# Patient Record
Sex: Male | Born: 1975 | Race: Black or African American | Hispanic: No
Health system: Southern US, Community
[De-identification: ages and names within clinical notes are randomized; demographics above are authoritative.]

## PROBLEM LIST (undated history)

## (undated) DIAGNOSIS — F329 Major depressive disorder, single episode, unspecified: Secondary | ICD-10-CM

## (undated) DIAGNOSIS — F32A Depression, unspecified: Secondary | ICD-10-CM

## (undated) DIAGNOSIS — R569 Unspecified convulsions: Secondary | ICD-10-CM

## (undated) DIAGNOSIS — F319 Bipolar disorder, unspecified: Secondary | ICD-10-CM

## (undated) DIAGNOSIS — F209 Schizophrenia, unspecified: Secondary | ICD-10-CM

## (undated) HISTORY — PX: FINGER SURGERY: SHX640

## (undated) HISTORY — DX: Major depressive disorder, single episode, unspecified: F32.9

## (undated) HISTORY — DX: Bipolar disorder, unspecified: F31.9

## (undated) HISTORY — DX: Depression, unspecified: F32.A

## (undated) HISTORY — DX: Schizophrenia, unspecified: F20.9

---

## 1998-08-27 ENCOUNTER — Emergency Department (HOSPITAL_COMMUNITY): Admission: EM | Admit: 1998-08-27 | Discharge: 1998-08-27 | Payer: Self-pay | Admitting: Emergency Medicine

## 1998-08-27 ENCOUNTER — Encounter: Payer: Self-pay | Admitting: Emergency Medicine

## 1998-11-07 ENCOUNTER — Emergency Department (HOSPITAL_COMMUNITY): Admission: EM | Admit: 1998-11-07 | Discharge: 1998-11-07 | Payer: Self-pay | Admitting: Emergency Medicine

## 1998-12-30 ENCOUNTER — Emergency Department (HOSPITAL_COMMUNITY): Admission: EM | Admit: 1998-12-30 | Discharge: 1998-12-30 | Payer: Self-pay | Admitting: *Deleted

## 1999-03-22 ENCOUNTER — Inpatient Hospital Stay (HOSPITAL_COMMUNITY): Admission: AD | Admit: 1999-03-22 | Discharge: 1999-03-23 | Payer: Self-pay | Admitting: *Deleted

## 2000-06-24 ENCOUNTER — Encounter: Admission: RE | Admit: 2000-06-24 | Discharge: 2000-06-24 | Payer: Self-pay | Admitting: Family Medicine

## 2000-12-01 ENCOUNTER — Encounter: Admission: RE | Admit: 2000-12-01 | Discharge: 2000-12-01 | Payer: Self-pay | Admitting: Family Medicine

## 2001-01-21 ENCOUNTER — Encounter: Admission: RE | Admit: 2001-01-21 | Discharge: 2001-01-21 | Payer: Self-pay | Admitting: Family Medicine

## 2001-01-29 ENCOUNTER — Encounter: Admission: RE | Admit: 2001-01-29 | Discharge: 2001-02-19 | Payer: Self-pay | Admitting: Sports Medicine

## 2001-05-19 ENCOUNTER — Encounter: Admission: RE | Admit: 2001-05-19 | Discharge: 2001-05-19 | Payer: Self-pay | Admitting: Family Medicine

## 2001-11-16 ENCOUNTER — Encounter: Admission: RE | Admit: 2001-11-16 | Discharge: 2001-11-16 | Payer: Self-pay | Admitting: Family Medicine

## 2002-03-18 ENCOUNTER — Encounter: Admission: RE | Admit: 2002-03-18 | Discharge: 2002-03-18 | Payer: Self-pay | Admitting: Family Medicine

## 2002-03-18 ENCOUNTER — Ambulatory Visit (HOSPITAL_COMMUNITY): Admission: RE | Admit: 2002-03-18 | Discharge: 2002-03-18 | Payer: Self-pay | Admitting: Family Medicine

## 2002-03-22 ENCOUNTER — Encounter: Admission: RE | Admit: 2002-03-22 | Discharge: 2002-03-22 | Payer: Self-pay | Admitting: Sports Medicine

## 2002-03-22 ENCOUNTER — Encounter: Payer: Self-pay | Admitting: Sports Medicine

## 2003-06-22 ENCOUNTER — Emergency Department (HOSPITAL_COMMUNITY): Admission: EM | Admit: 2003-06-22 | Discharge: 2003-06-22 | Payer: Self-pay

## 2004-05-21 ENCOUNTER — Emergency Department (HOSPITAL_COMMUNITY): Admission: EM | Admit: 2004-05-21 | Discharge: 2004-05-21 | Payer: Self-pay | Admitting: Family Medicine
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
## 2004-05-25 ENCOUNTER — Ambulatory Visit: Payer: Self-pay | Admitting: Family Medicine

## 2004-09-21 ENCOUNTER — Emergency Department (HOSPITAL_COMMUNITY): Admission: EM | Admit: 2004-09-21 | Discharge: 2004-09-21 | Payer: Self-pay | Admitting: Emergency Medicine
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
## 2004-09-28 ENCOUNTER — Ambulatory Visit: Payer: Self-pay | Admitting: Family Medicine

## 2004-10-22 ENCOUNTER — Ambulatory Visit: Payer: Self-pay | Admitting: Family Medicine

## 2005-09-06 IMAGING — CR DG FOOT COMPLETE 3+V*R*
3 series · 3 of 3 positions shown · non-contrast
Comparison: none

CLINICAL DATA: pain
RIGHT FOOT - 3 VIEW:

[view not recorded (1 of 3)]
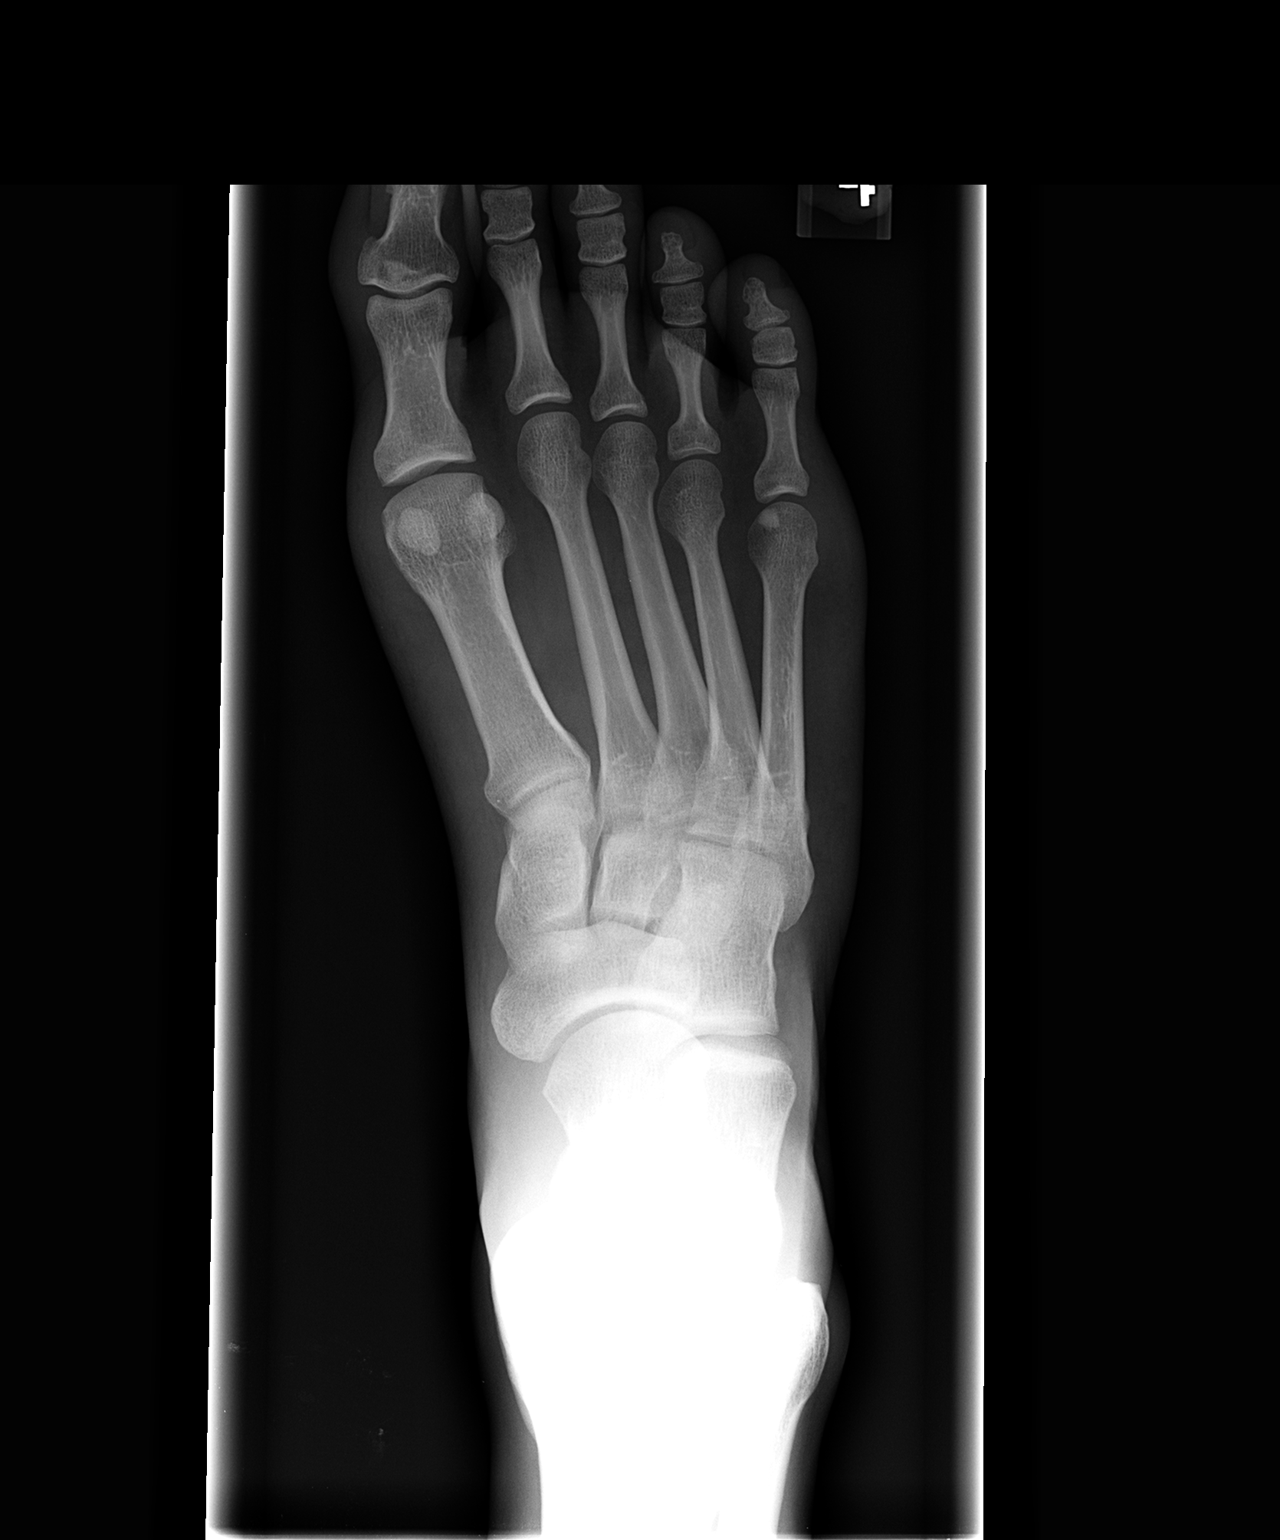

[view not recorded (2 of 3)]
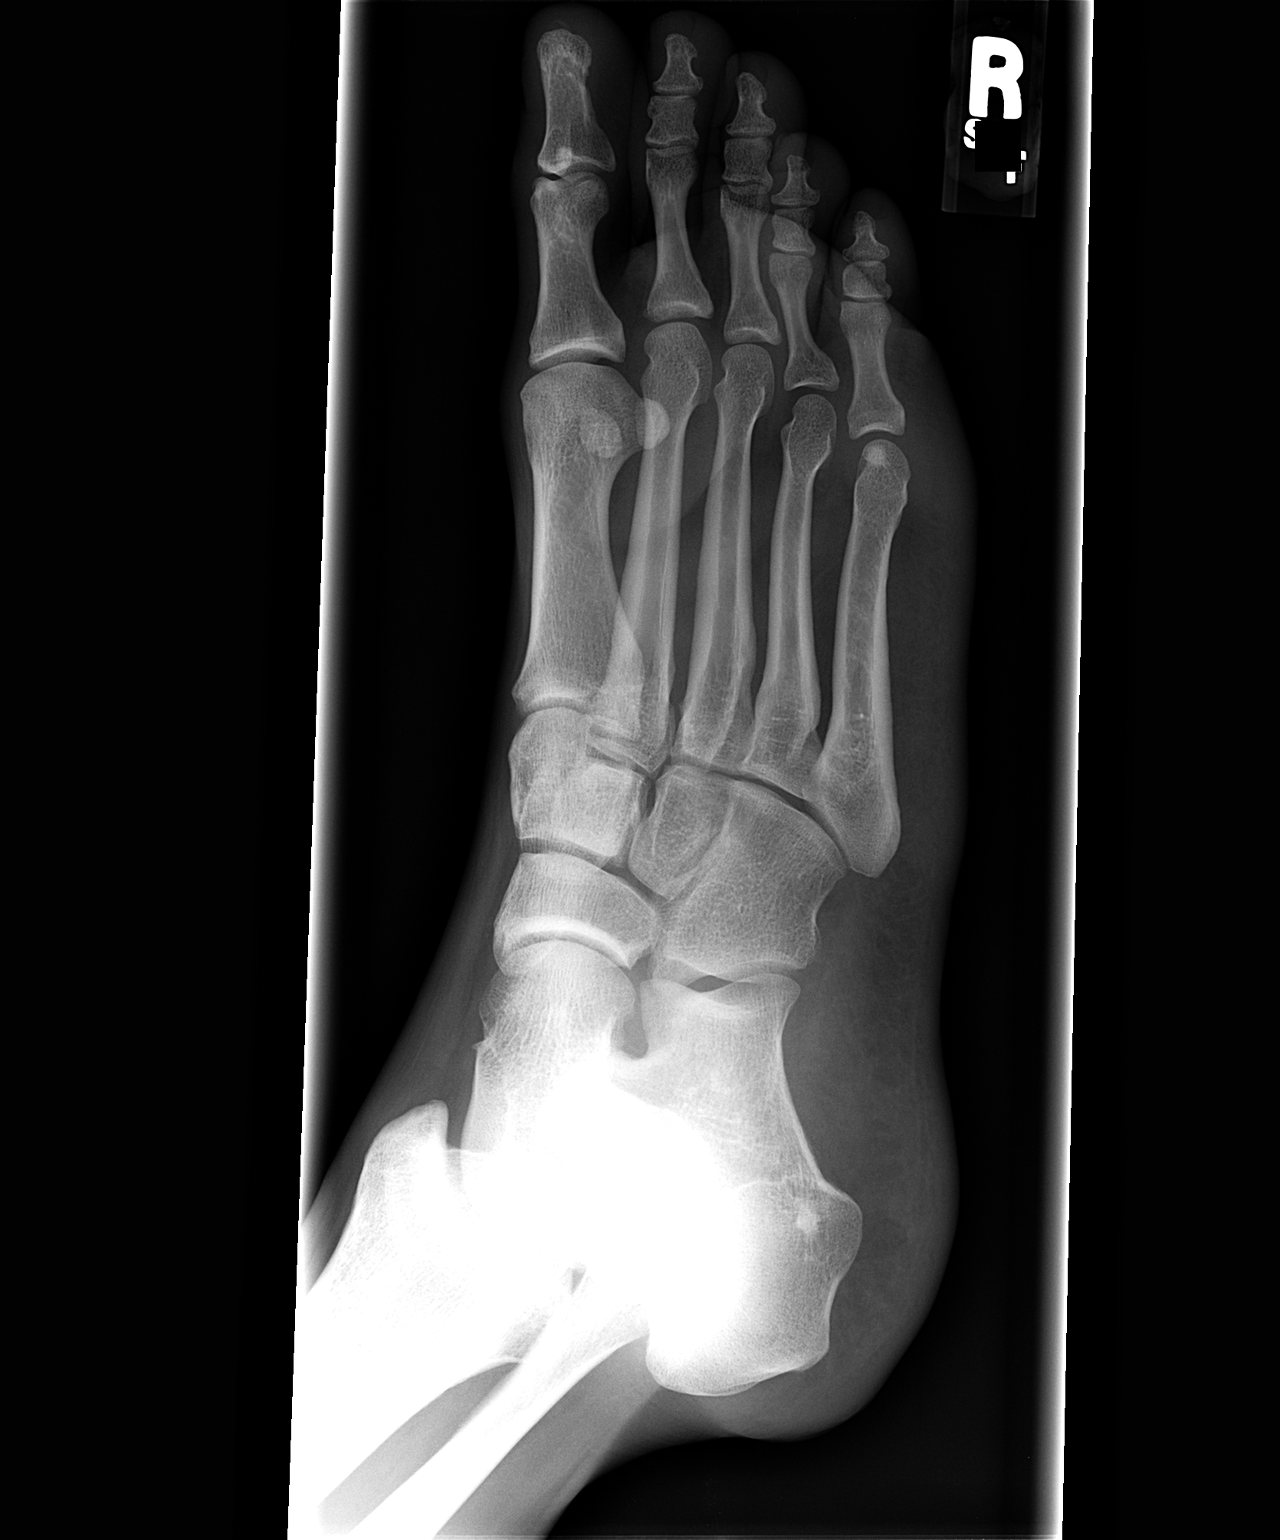

[view not recorded (3 of 3)]
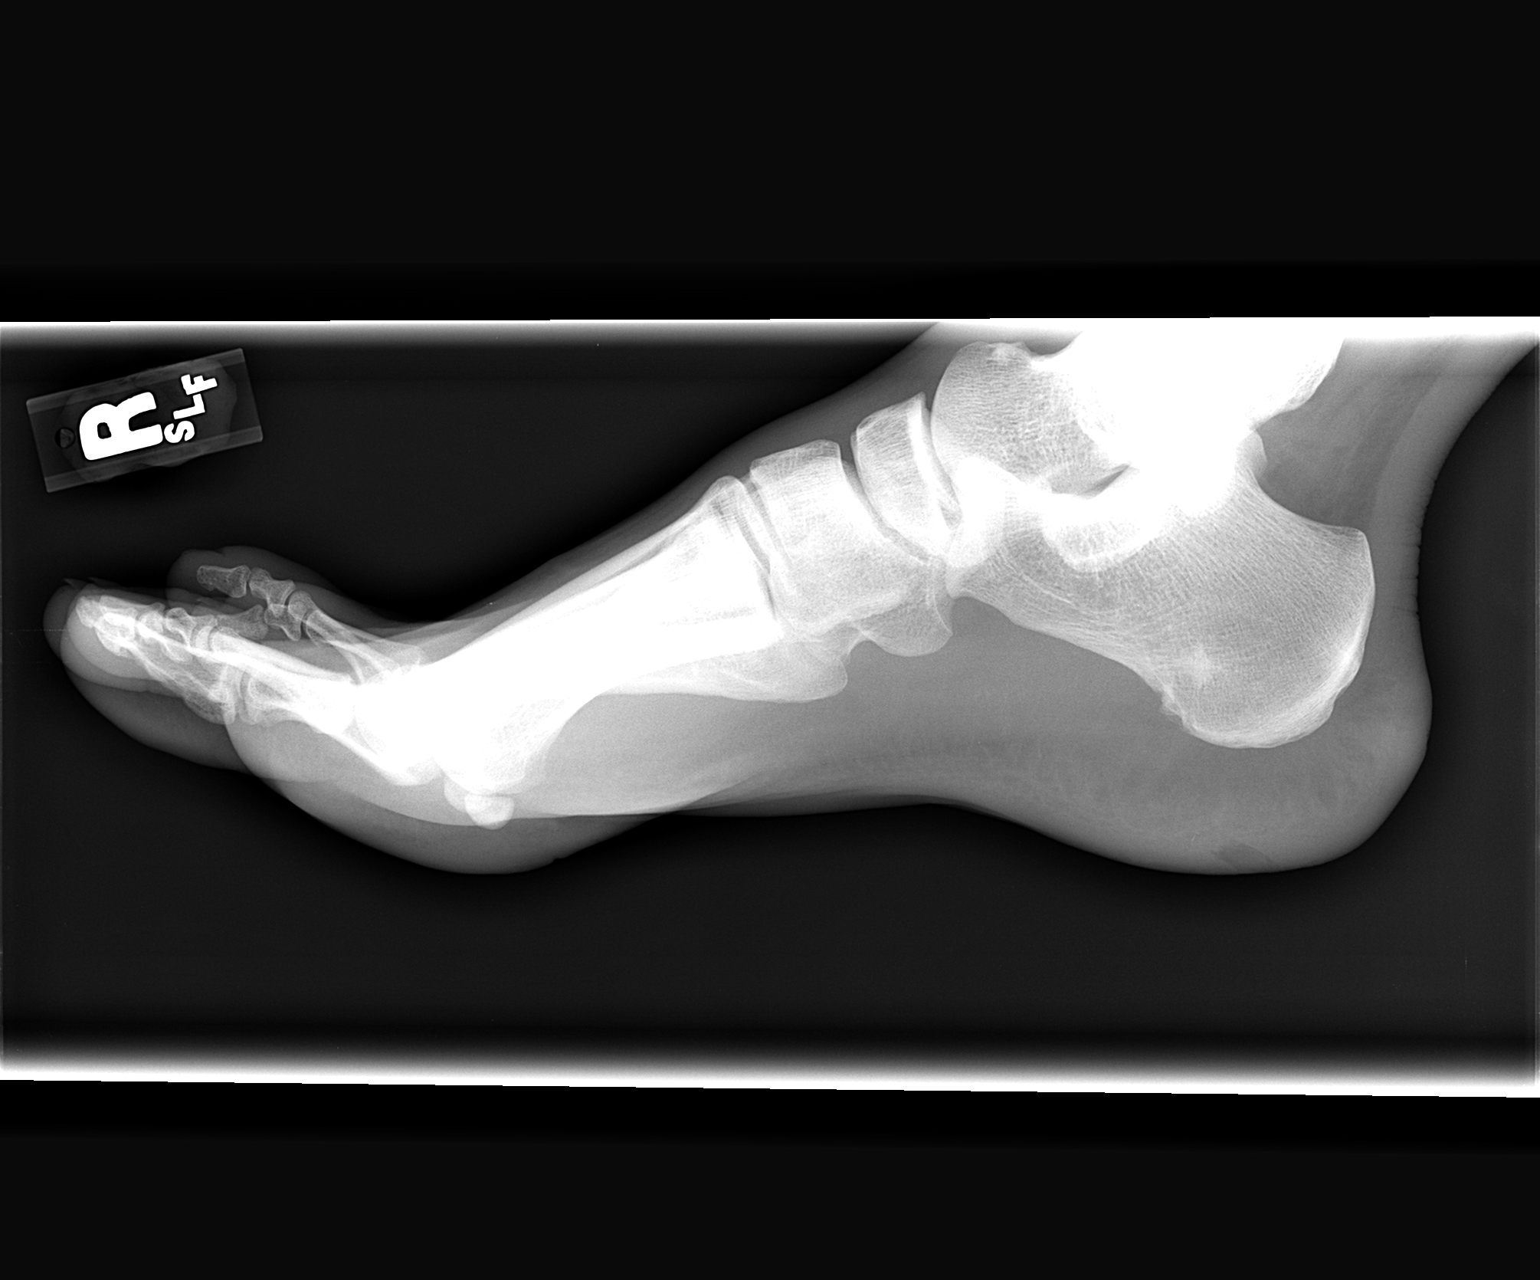

[3 of 3 positions shown; findings below may reference images not displayed]

FINDINGS: There is no evidence of fracture or dislocation.  There is no evidence of arthropathy or other focal bone abnormality.  Soft tissues are unremarkable.
IMPRESSION: Negative.

## 2006-01-28 ENCOUNTER — Emergency Department (HOSPITAL_COMMUNITY): Admission: EM | Admit: 2006-01-28 | Discharge: 2006-01-28 | Payer: Self-pay | Admitting: Emergency Medicine

## 2008-08-16 ENCOUNTER — Emergency Department: Payer: Self-pay | Admitting: Emergency Medicine
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
## 2010-11-21 ENCOUNTER — Emergency Department: Payer: Self-pay | Admitting: Emergency Medicine
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
## 2011-07-12 ENCOUNTER — Emergency Department: Payer: Self-pay | Admitting: Emergency Medicine

## 2011-07-12 DIAGNOSIS — M7989 Other specified soft tissue disorders: Secondary | ICD-10-CM | POA: Diagnosis not present

## 2011-07-12 DIAGNOSIS — S93409A Sprain of unspecified ligament of unspecified ankle, initial encounter: Secondary | ICD-10-CM | POA: Diagnosis not present

## 2011-07-12 DIAGNOSIS — S99919A Unspecified injury of unspecified ankle, initial encounter: Secondary | ICD-10-CM | POA: Diagnosis not present

## 2011-07-12 DIAGNOSIS — Z79899 Other long term (current) drug therapy: Secondary | ICD-10-CM | POA: Diagnosis not present

## 2011-07-12 DIAGNOSIS — F17200 Nicotine dependence, unspecified, uncomplicated: Secondary | ICD-10-CM | POA: Diagnosis not present

## 2011-07-12 DIAGNOSIS — S8990XA Unspecified injury of unspecified lower leg, initial encounter: Secondary | ICD-10-CM | POA: Diagnosis not present

## 2011-07-12 IMAGING — CR DG ANKLE COMPLETE 3+V*L*
1 series · 5 of 5 positions shown · non-contrast
Comparison: none

REASON FOR EXAM: injury and pain
COMMENTS:

[Series 1: x ankle ap left · 0.14mm/px · 5 of 5 slices shown]
[im 1/5]
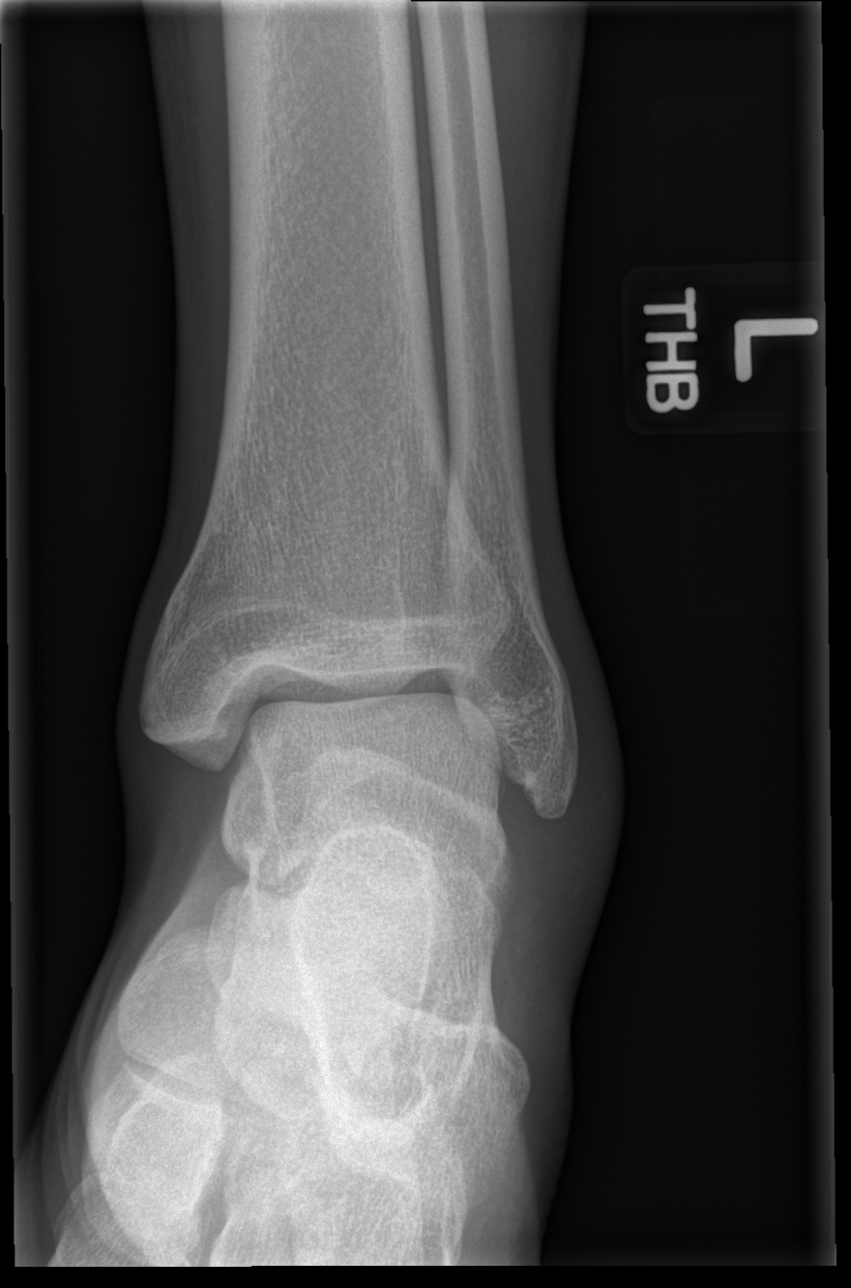
[im 2/5]
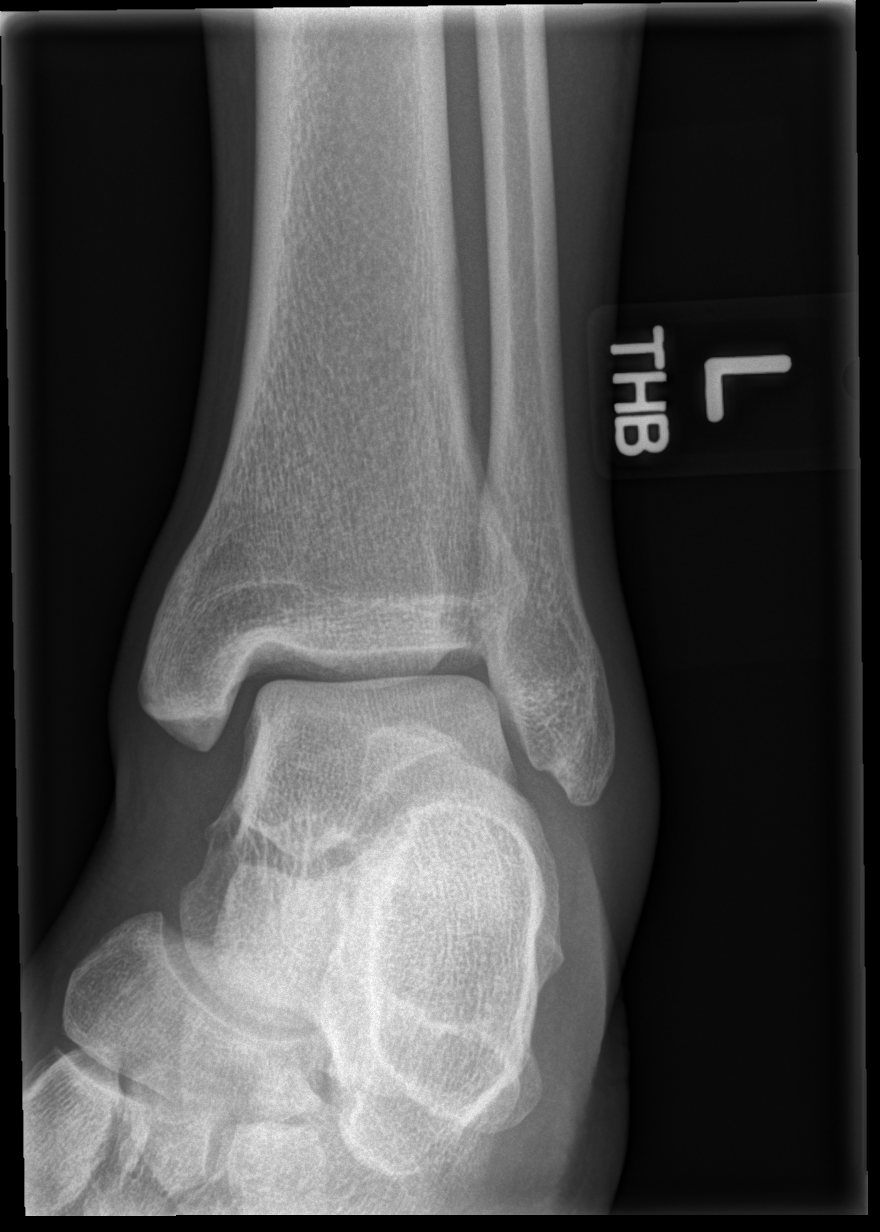
[im 3/5]
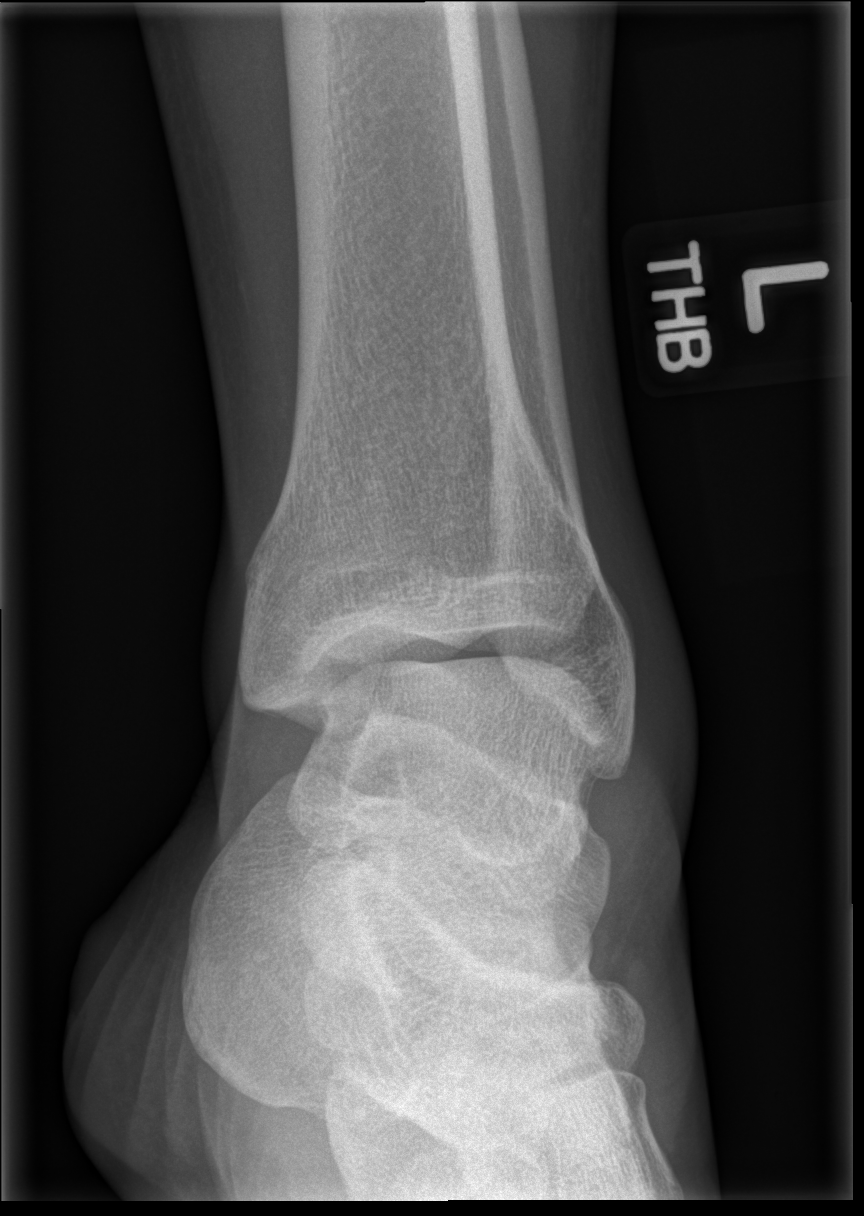
[im 4/5]
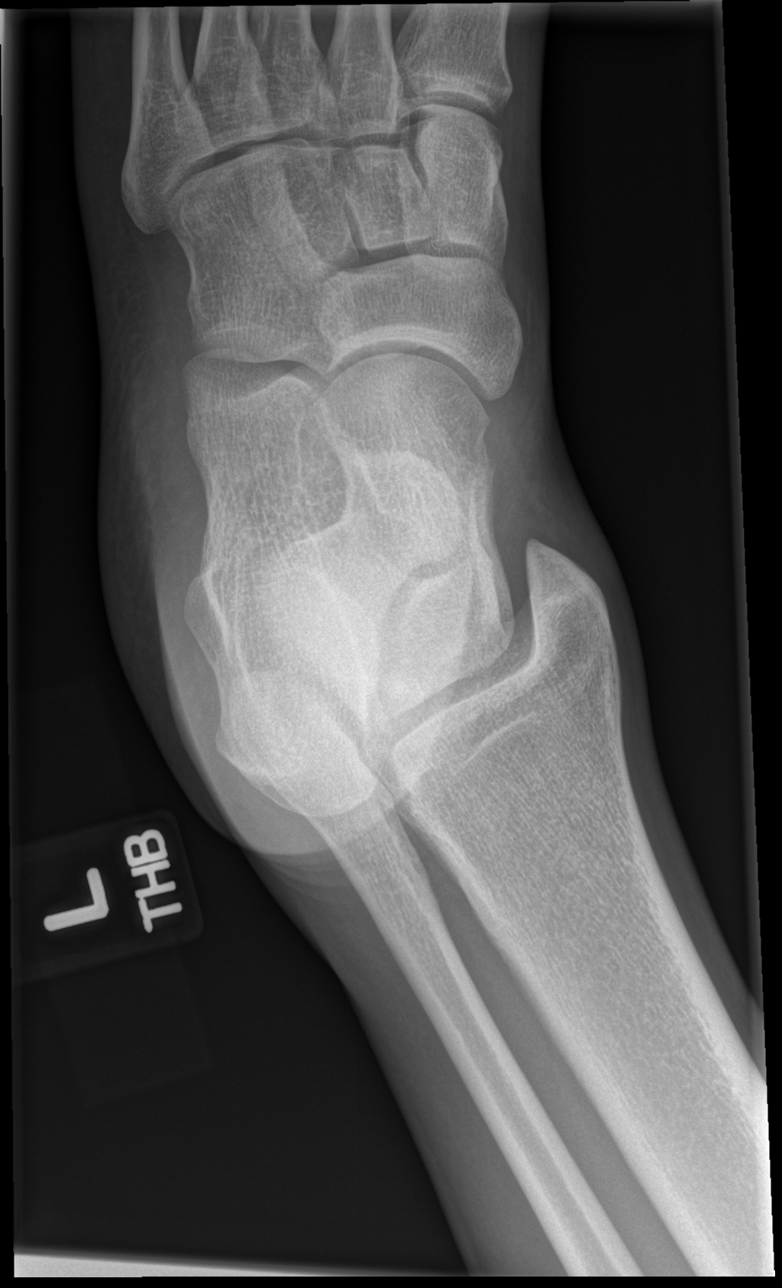
[im 5/5]
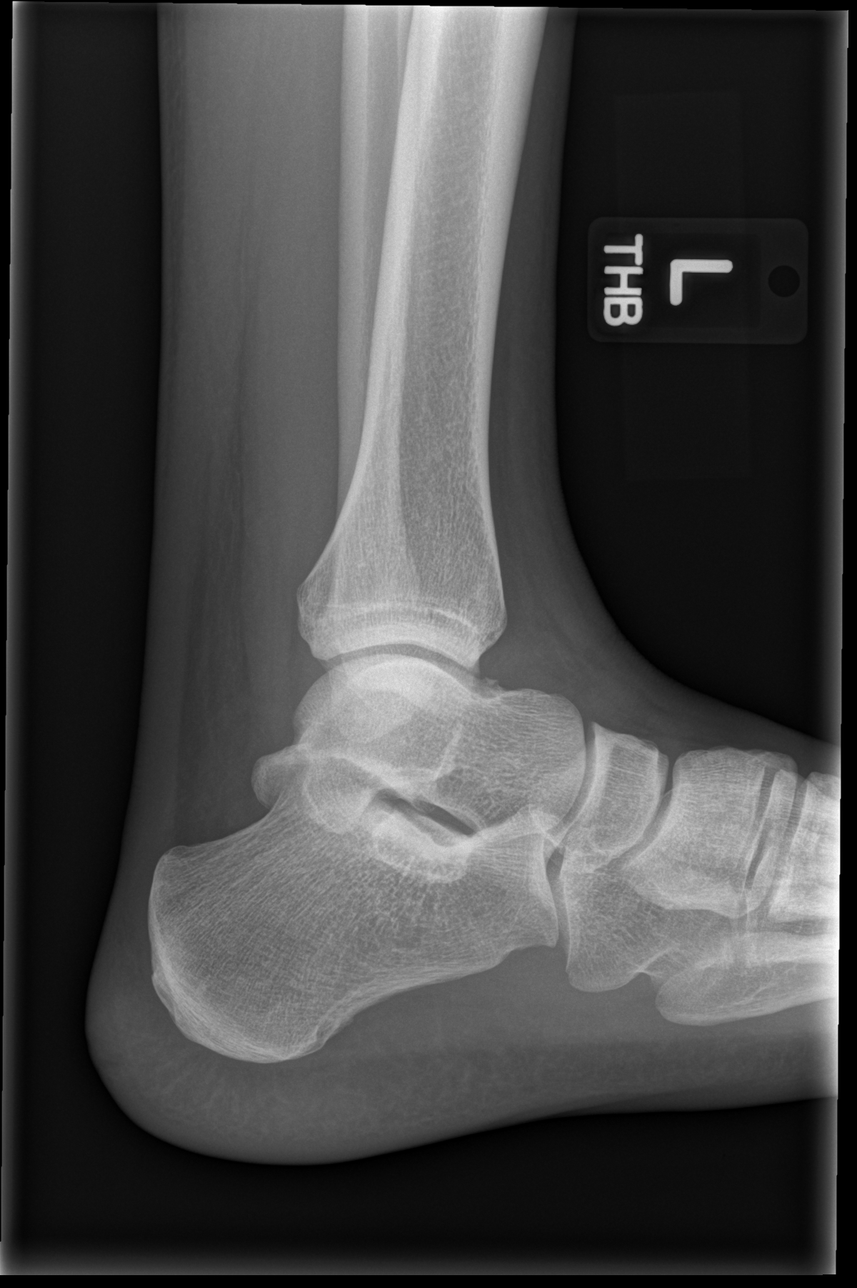

[5 of 5 positions shown; findings below may reference images not displayed]

PROCEDURE:     DXR - DXR ANKLE LEFT COMPLETE  - [DATE]  [DATE]

RESULT:     Findings: There is no evidence of fracture, dislocation or
malalignment. Note a Salter-Harris type I fracture can be radio occult and
if there is persistent clinical concern repeat evaluation in 7 to 10 days
and/or MRI is recommended. There

There is mild soft tissue swelling the lateral malleolar region which
represent a component of ligamentous injury.
IMPRESSION: No evidence of acute osseous abnormalities.

## 2012-01-08 ENCOUNTER — Emergency Department: Payer: Self-pay | Admitting: Emergency Medicine

## 2012-01-08 DIAGNOSIS — R6884 Jaw pain: Secondary | ICD-10-CM | POA: Diagnosis not present

## 2012-01-08 DIAGNOSIS — K029 Dental caries, unspecified: Secondary | ICD-10-CM | POA: Diagnosis not present

## 2012-01-08 DIAGNOSIS — R030 Elevated blood-pressure reading, without diagnosis of hypertension: Secondary | ICD-10-CM | POA: Diagnosis not present

## 2012-06-10 DIAGNOSIS — Z0000 Encounter for general adult medical examination without abnormal findings: Secondary | ICD-10-CM | POA: Diagnosis not present

## 2012-06-26 DIAGNOSIS — M25569 Pain in unspecified knee: Secondary | ICD-10-CM | POA: Diagnosis not present

## 2012-06-29 ENCOUNTER — Ambulatory Visit: Payer: Self-pay | Admitting: Family Medicine

## 2012-06-29 DIAGNOSIS — M25569 Pain in unspecified knee: Secondary | ICD-10-CM | POA: Diagnosis not present

## 2012-06-29 IMAGING — CR DG KNEE COMPLETE 4+V*R*
1 series · 4 of 4 positions shown · non-contrast
Comparison: none

REASON FOR EXAM: knee pain
COMMENTS:

PROCEDURE:     MDR - MDR KNEE RT COMPLETE W/OBLIQUES  - [DATE] [DATE]
RESULT:     Comparison: None.

[Series 1: ap · 0.17mm/px · 4 of 4 slices shown]
[im 1/4]
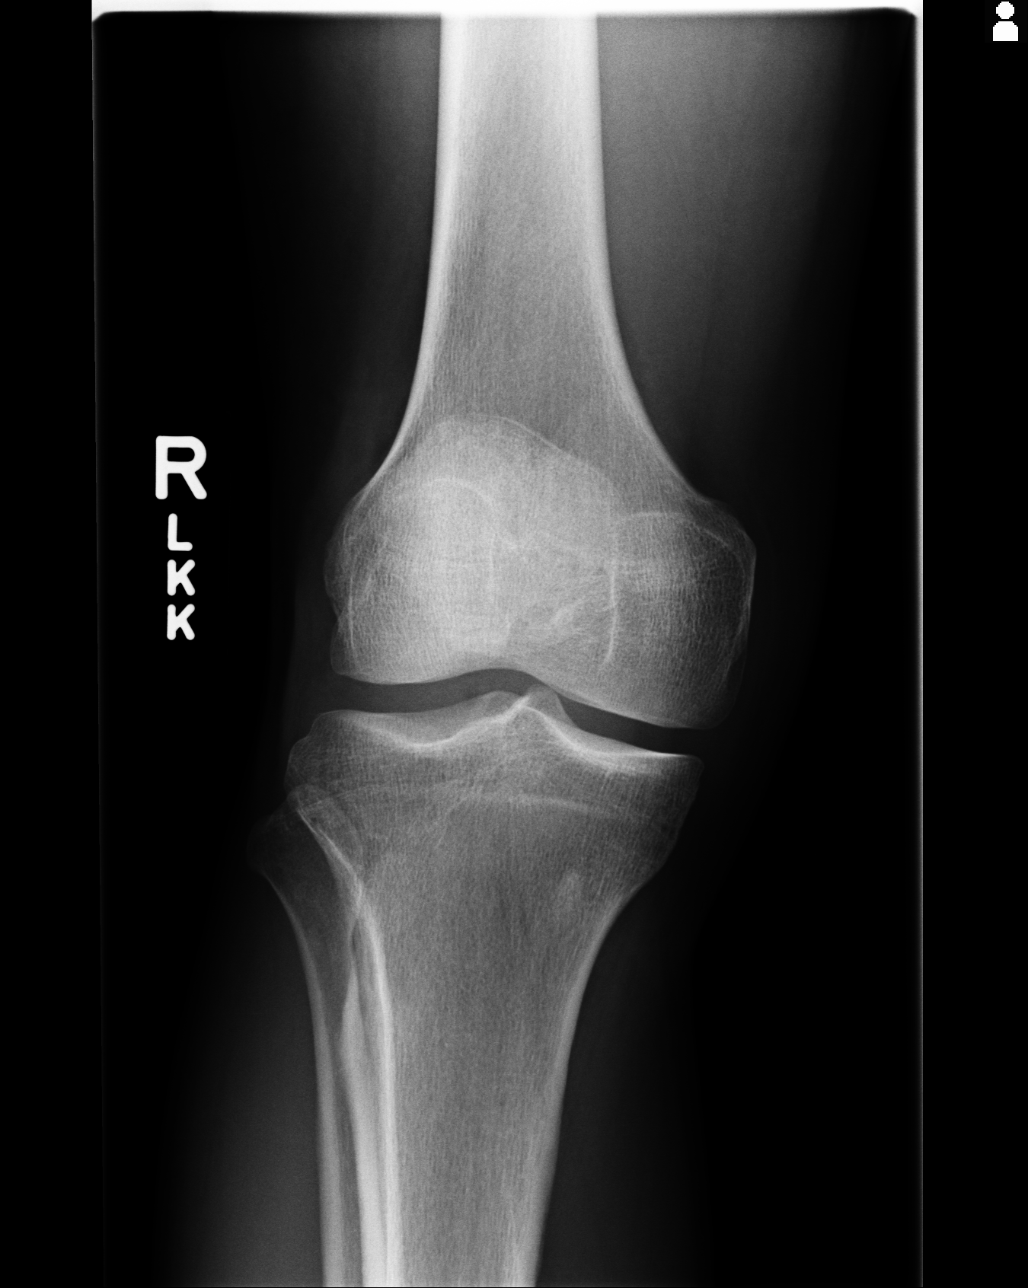
[im 2/4]
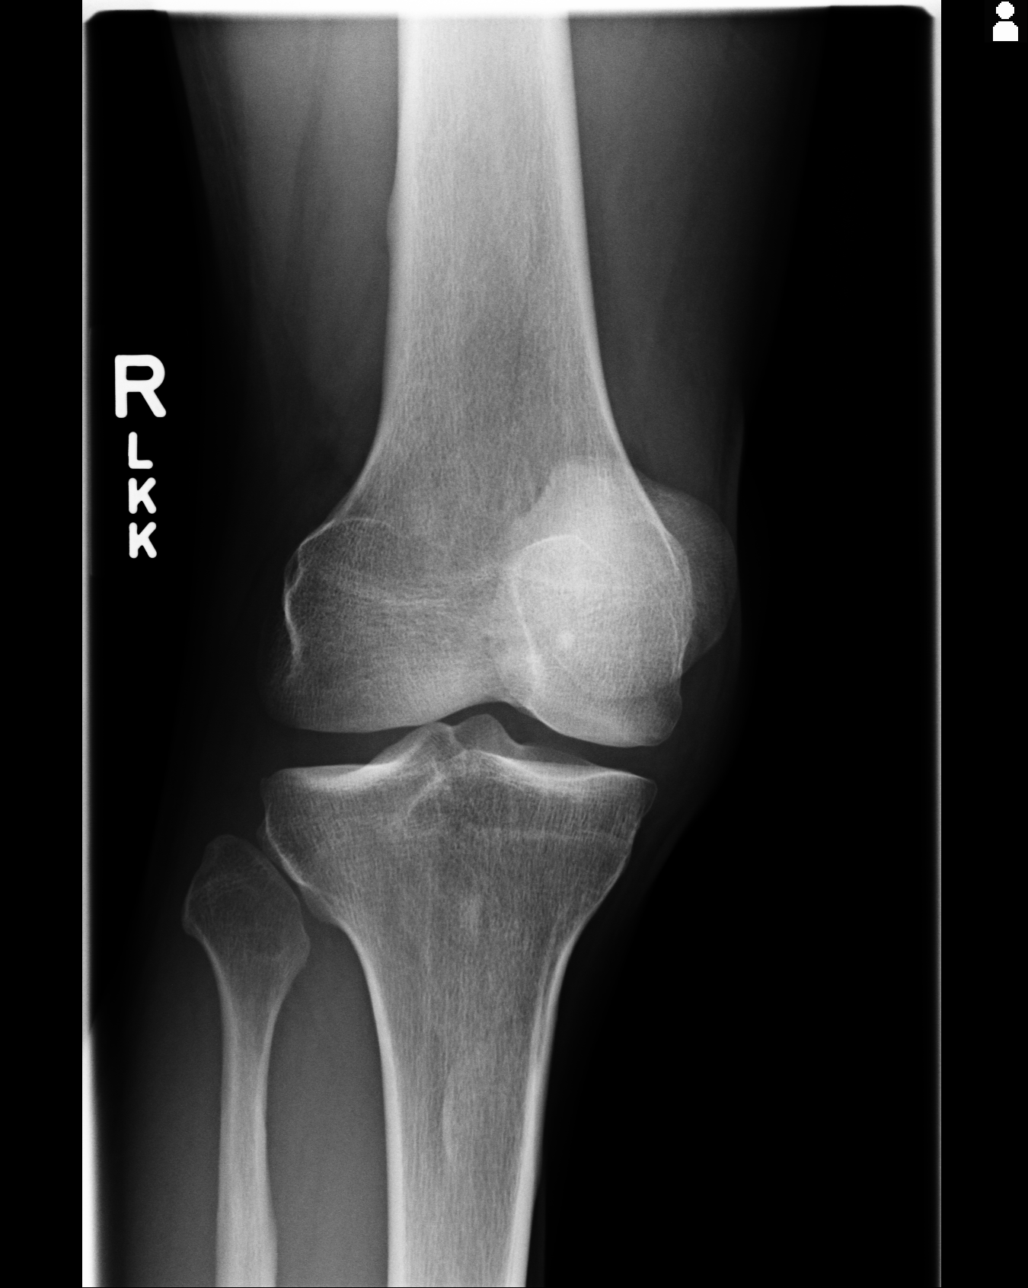
[im 3/4]
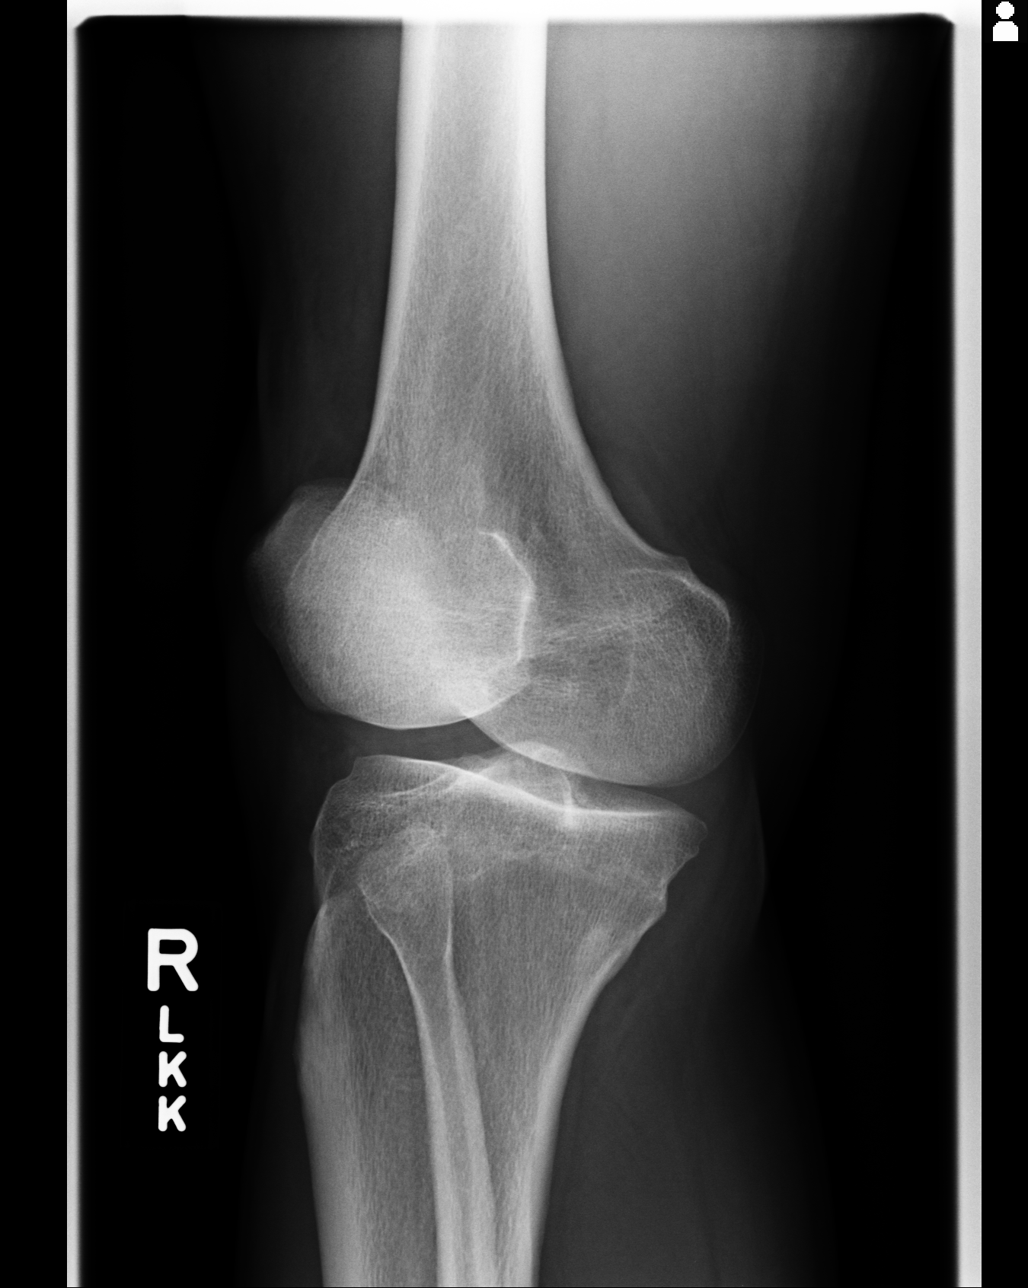
[im 4/4]
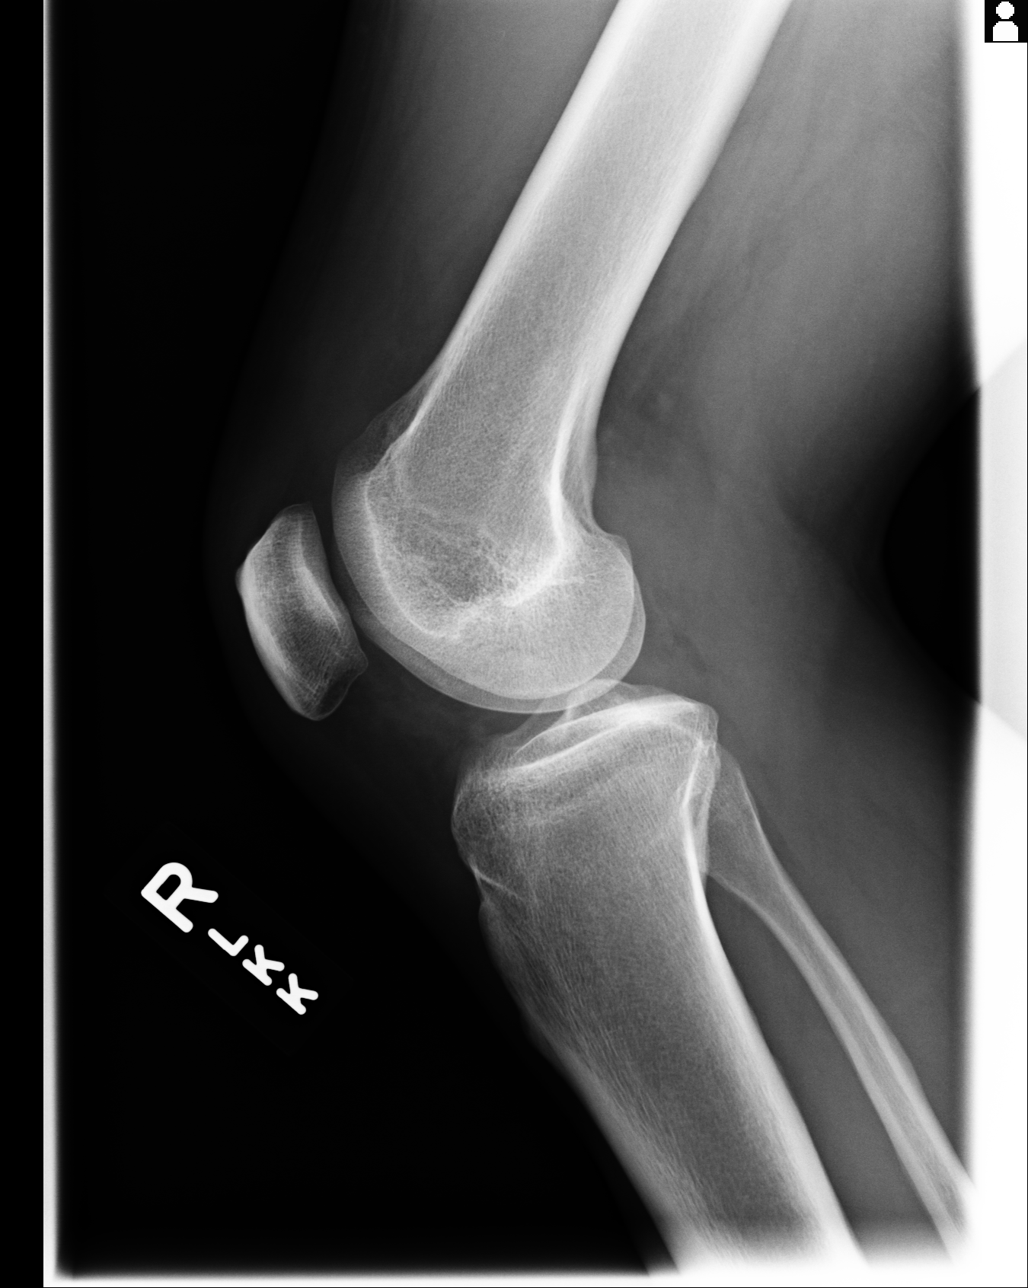

[4 of 4 positions shown; findings below may reference images not displayed]

FINDINGS: No acute fracture. Joint spaces are maintained. Indeterminate for effusion.
IMPRESSION: No acute findings.

[REDACTED]

## 2012-09-22 DIAGNOSIS — K047 Periapical abscess without sinus: Secondary | ICD-10-CM | POA: Diagnosis not present

## 2012-10-13 ENCOUNTER — Emergency Department: Payer: Self-pay | Admitting: Unknown Physician Specialty

## 2012-10-13 DIAGNOSIS — F17200 Nicotine dependence, unspecified, uncomplicated: Secondary | ICD-10-CM | POA: Diagnosis not present

## 2012-10-13 DIAGNOSIS — R112 Nausea with vomiting, unspecified: Secondary | ICD-10-CM | POA: Diagnosis not present

## 2012-10-13 LAB — COMPREHENSIVE METABOLIC PANEL
Albumin: 4.2 g/dL (ref 3.4–5.0)
Bilirubin,Total: 0.6 mg/dL (ref 0.2–1.0)
Calcium, Total: 9 mg/dL (ref 8.5–10.1)
Co2: 26 mmol/L (ref 21–32)
Creatinine: 0.97 mg/dL (ref 0.60–1.30)
EGFR (Non-African Amer.): >60
Potassium: 3.9 mmol/L (ref 3.5–5.1)
Total Protein: 7.6 g/dL (ref 6.4–8.2)

## 2012-10-13 LAB — CBC
HCT: 43 % (ref 40.0–52.0)
MCV: 88 fL (ref 80–100)
WBC: 18.2 10*3/uL — ABNORMAL HIGH (ref 3.8–10.6)

## 2012-10-21 DIAGNOSIS — Z79899 Other long term (current) drug therapy: Secondary | ICD-10-CM | POA: Diagnosis not present

## 2012-11-19 DIAGNOSIS — Z79899 Other long term (current) drug therapy: Secondary | ICD-10-CM | POA: Diagnosis not present

## 2012-12-15 DIAGNOSIS — J069 Acute upper respiratory infection, unspecified: Secondary | ICD-10-CM | POA: Diagnosis not present

## 2013-08-21 ENCOUNTER — Emergency Department: Payer: Self-pay | Admitting: Emergency Medicine

## 2013-08-21 DIAGNOSIS — M67919 Unspecified disorder of synovium and tendon, unspecified shoulder: Secondary | ICD-10-CM | POA: Diagnosis not present

## 2013-08-21 DIAGNOSIS — F209 Schizophrenia, unspecified: Secondary | ICD-10-CM | POA: Diagnosis not present

## 2013-08-21 DIAGNOSIS — Z79899 Other long term (current) drug therapy: Secondary | ICD-10-CM | POA: Diagnosis not present

## 2013-08-21 DIAGNOSIS — M719 Bursopathy, unspecified: Secondary | ICD-10-CM | POA: Diagnosis not present

## 2014-03-12 ENCOUNTER — Emergency Department: Payer: Self-pay | Admitting: Emergency Medicine

## 2014-03-12 DIAGNOSIS — Z720 Tobacco use: Secondary | ICD-10-CM | POA: Diagnosis not present

## 2014-03-12 DIAGNOSIS — R45851 Suicidal ideations: Secondary | ICD-10-CM | POA: Diagnosis not present

## 2014-03-12 DIAGNOSIS — Z79899 Other long term (current) drug therapy: Secondary | ICD-10-CM | POA: Diagnosis not present

## 2014-03-12 LAB — URINALYSIS, COMPLETE
BACTERIA: NONE SEEN
BLOOD: NEGATIVE
Bilirubin,UR: NEGATIVE
GLUCOSE, UR: NEGATIVE mg/dL (ref 0–75)
Leukocyte Esterase: NEGATIVE
Nitrite: NEGATIVE
PH: 5 (ref 4.5–8.0)
PROTEIN: NEGATIVE
SPECIFIC GRAVITY: 1.026 (ref 1.003–1.030)
Squamous Epithelial: NONE SEEN
WBC UR: 1 /HPF (ref 0–5)

## 2014-03-12 LAB — COMPREHENSIVE METABOLIC PANEL
ALBUMIN: 4 g/dL (ref 3.4–5.0)
ALK PHOS: 76 U/L
ANION GAP: 6 — AB (ref 7–16)
BUN: 14 mg/dL (ref 7–18)
Bilirubin,Total: 0.6 mg/dL (ref 0.2–1.0)
CALCIUM: 8.9 mg/dL (ref 8.5–10.1)
Chloride: 106 mmol/L (ref 98–107)
Co2: 27 mmol/L (ref 21–32)
Creatinine: 1.24 mg/dL (ref 0.60–1.30)
GLUCOSE: 127 mg/dL — AB (ref 65–99)
Osmolality: 280 (ref 275–301)
POTASSIUM: 3.4 mmol/L — AB (ref 3.5–5.1)
SGOT(AST): 27 U/L (ref 15–37)
SGPT (ALT): 30 U/L
Sodium: 139 mmol/L (ref 136–145)
Total Protein: 7.5 g/dL (ref 6.4–8.2)

## 2014-03-12 LAB — CBC
HCT: 44.5 % (ref 40.0–52.0)
HGB: 14.6 g/dL (ref 13.0–18.0)
MCH: 29.6 pg (ref 26.0–34.0)
MCHC: 32.8 g/dL (ref 32.0–36.0)
MCV: 90 fL (ref 80–100)
PLATELETS: 183 10*3/uL (ref 150–440)
RBC: 4.93 10*6/uL (ref 4.40–5.90)
RDW: 15.4 % — ABNORMAL HIGH (ref 11.5–14.5)
WBC: 10.7 10*3/uL — ABNORMAL HIGH (ref 3.8–10.6)

## 2014-03-12 LAB — DRUG SCREEN, URINE
AMPHETAMINES, UR SCREEN: NEGATIVE (ref ?–1000)
BARBITURATES, UR SCREEN: NEGATIVE (ref ?–200)
Benzodiazepine, Ur Scrn: NEGATIVE (ref ?–200)
CANNABINOID 50 NG, UR ~~LOC~~: POSITIVE (ref ?–50)
Cocaine Metabolite,Ur ~~LOC~~: POSITIVE (ref ?–300)
MDMA (Ecstasy)Ur Screen: NEGATIVE (ref ?–500)
METHADONE, UR SCREEN: NEGATIVE (ref ?–300)
OPIATE, UR SCREEN: NEGATIVE (ref ?–300)
Phencyclidine (PCP) Ur S: NEGATIVE (ref ?–25)
TRICYCLIC, UR SCREEN: NEGATIVE (ref ?–1000)

## 2014-03-12 LAB — ACETAMINOPHEN LEVEL

## 2014-03-12 LAB — ETHANOL

## 2014-03-12 LAB — SALICYLATE LEVEL: Salicylates, Serum: 5.2 mg/dL — ABNORMAL HIGH

## 2014-10-28 DIAGNOSIS — F250 Schizoaffective disorder, bipolar type: Secondary | ICD-10-CM | POA: Diagnosis not present

## 2014-12-13 ENCOUNTER — Encounter: Payer: Self-pay | Admitting: Family Medicine

## 2015-11-16 ENCOUNTER — Encounter: Payer: Self-pay | Admitting: Family Medicine

## 2015-11-16 ENCOUNTER — Ambulatory Visit (INDEPENDENT_AMBULATORY_CARE_PROVIDER_SITE_OTHER): Payer: Medicare Other | Admitting: Family Medicine

## 2015-11-16 VITALS — BP 100/64 | HR 82 | Temp 98.7°F | Resp 18 | Ht 72.0 in | Wt 188.4 lb

## 2015-11-16 DIAGNOSIS — H8110 Benign paroxysmal vertigo, unspecified ear: Secondary | ICD-10-CM | POA: Insufficient documentation

## 2015-11-16 DIAGNOSIS — H8113 Benign paroxysmal vertigo, bilateral: Secondary | ICD-10-CM

## 2015-11-16 DIAGNOSIS — R42 Dizziness and giddiness: Secondary | ICD-10-CM | POA: Diagnosis not present

## 2015-11-16 MED ORDER — MECLIZINE HCL 25 MG PO TABS: 25.0000 mg | ORAL_TABLET | Freq: Three times a day (TID) | ORAL | Status: DC | PRN

## 2015-11-16 NOTE — Progress Notes (Signed)
Name: Luis Hunter   MRN: [id number]    DOB: 1976/04/06   Date:11/16/2015       Progress Note  Subjective  Chief Complaint  Chief Complaint  Patient presents with  . Dizziness    Dizziness This is a new problem. Episode onset: 10+ days ago. The problem occurs intermittently. Associated symptoms include nausea and vertigo (feels like the room is spinning.). Pertinent negatives include no chest pain, myalgias, sore throat or vomiting. Exacerbated by: closing his eyes makes it worse. He has tried nothing for the symptoms.  10 day history of acute onset vertigo, worse with closing his eyes, turning his head and neck, no lightheadedness.  Past Medical History  Diagnosis Date  . Depression   . Schizophrenia ([place])   . Bipolar disorder [facility])     Past Surgical History  Procedure Laterality Date  . Finger surgery      History reviewed. No pertinent family history.  Social History   Social History  . Marital Status: Single    Spouse Name: N/A  . Number of Children: N/A  . Years of Education: N/A   Occupational History  . Not on file.   Social History Main Topics  . Smoking status: Current Every Day Smoker  . Smokeless tobacco: Never Used  . Alcohol Use: No  . Drug Use: Yes    Special: Marijuana  . Sexual Activity: Not Currently   Other Topics Concern  . Not on file   Social History Narrative  . No narrative on file     Current outpatient prescriptions:  .  ABILIFY MAINTENA 400 MG SUSR, , Disp: , Rfl:   No Known Allergies   Review of Systems  HENT: Negative for sore throat.   Cardiovascular: Negative for chest pain.  Gastrointestinal: Positive for nausea. Negative for vomiting.  Musculoskeletal: Negative for myalgias.  Neurological: Positive for dizziness and vertigo (feels like the room is spinning.).    Objective  Filed Vitals:   11/16/15 1322  BP: 100/64  Pulse: 82  Temp: 98.7 F (37.1 C)  TempSrc: Oral  Resp: 18  Height: 6' (1.829 m)  Weight:  188 lb 6.4 oz (85.458 kg)  SpO2: 95%    Physical Exam  Constitutional: He is oriented to person, place, and time and well-developed, well-nourished, and in no distress.  HENT:  Right Ear: Tympanic membrane and ear canal normal. No drainage or swelling.  Left Ear: Tympanic membrane and ear canal normal. No drainage or swelling.  Mouth/Throat: No posterior oropharyngeal erythema.  Cardiovascular: Normal rate, regular rhythm, S1 normal, S2 normal and normal heart sounds.   No murmur heard. Pulmonary/Chest: Effort normal and breath sounds normal. He has no wheezes.  Abdominal: Soft. Bowel sounds are normal. There is no tenderness.  Neurological: He is alert and oriented to person, place, and time. He has intact cranial nerves. Gait normal.  Skin: Skin is warm and dry.  Nursing note and vitals reviewed.    Assessment & Plan  1. BPPV (benign paroxysmal positional vertigo), bilateral Recommended increased fluid intake and start on meclizine. If symptoms do not resolve, will need referral to ENT - meclizine (ANTIVERT) 25 MG tablet; Take 1 tablet (25 mg total) by mouth 3 (three) times daily as needed for dizziness.  Dispense: 30 tablet; Refill: 0  2. Dizziness  - CBC with Differential - Comprehensive Metabolic Panel (CMET)   Tayvien Kane [facility] 11/16/2015 1:35 PM

## 2015-11-17 ENCOUNTER — Telehealth: Payer: Self-pay | Admitting: Emergency Medicine

## 2015-11-17 LAB — CBC WITH DIFFERENTIAL/PLATELET
BASOS ABS: 0 {cells}/uL (ref 0–200)
Basophils Relative: 0 %
EOS PCT: 1 %
Eosinophils Absolute: 91 cells/uL (ref 15–500)
HCT: 42.1 % (ref 38.5–50.0)
HEMOGLOBIN: 14.2 g/dL (ref 13.2–17.1)
LYMPHS ABS: 1274 {cells}/uL (ref 850–3900)
Lymphocytes Relative: 14 %
MCH: 29.2 pg (ref 27.0–33.0)
MCHC: 33.7 g/dL (ref 32.0–36.0)
MCV: 86.6 fL (ref 80.0–100.0)
MPV: 11.2 fL (ref 7.5–12.5)
Monocytes Absolute: 910 cells/uL (ref 200–950)
Monocytes Relative: 10 %
NEUTROS ABS: 6825 {cells}/uL (ref 1500–7800)
Neutrophils Relative %: 75 %
Platelets: 166 10*3/uL (ref 140–400)
RBC: 4.86 MIL/uL (ref 4.20–5.80)
RDW: 15.9 % — ABNORMAL HIGH (ref 11.0–15.0)
WBC: 9.1 10*3/uL (ref 3.8–10.8)

## 2015-11-17 LAB — COMPREHENSIVE METABOLIC PANEL
ALBUMIN: 4 g/dL (ref 3.6–5.1)
ALK PHOS: 61 U/L (ref 40–115)
ALT: 16 U/L (ref 9–46)
AST: 19 U/L (ref 10–40)
BILIRUBIN TOTAL: 0.4 mg/dL (ref 0.2–1.2)
BUN: 14 mg/dL (ref 7–25)
CO2: 25 mmol/L (ref 20–31)
CREATININE: 1.09 mg/dL (ref 0.60–1.35)
Calcium: 9.2 mg/dL (ref 8.6–10.3)
Chloride: 107 mmol/L (ref 98–110)
Glucose, Bld: 93 mg/dL (ref 65–99)
Potassium: 4.2 mmol/L (ref 3.5–5.3)
SODIUM: 142 mmol/L (ref 135–146)
TOTAL PROTEIN: 6.5 g/dL (ref 6.1–8.1)

## 2015-11-17 NOTE — Telephone Encounter (Signed)
Patient notified of labs.   

## 2016-01-19 ENCOUNTER — Emergency Department: Payer: No Typology Code available for payment source

## 2016-01-19 ENCOUNTER — Emergency Department
Admission: EM | Admit: 2016-01-19 | Discharge: 2016-01-19 | Disposition: A | Discharge status: Home / Self Care | Payer: No Typology Code available for payment source | Attending: Student in an Organized Health Care Education/Training Program | Admitting: Student in an Organized Health Care Education/Training Program

## 2016-01-19 ENCOUNTER — Encounter: Payer: Self-pay | Admitting: Emergency Medicine

## 2016-01-19 DIAGNOSIS — S39012A Strain of muscle, fascia and tendon of lower back, initial encounter: Secondary | ICD-10-CM | POA: Diagnosis not present

## 2016-01-19 DIAGNOSIS — F17200 Nicotine dependence, unspecified, uncomplicated: Secondary | ICD-10-CM | POA: Insufficient documentation

## 2016-01-19 DIAGNOSIS — Y92410 Unspecified street and highway as the place of occurrence of the external cause: Secondary | ICD-10-CM | POA: Insufficient documentation

## 2016-01-19 DIAGNOSIS — S8002XA Contusion of left knee, initial encounter: Secondary | ICD-10-CM | POA: Insufficient documentation

## 2016-01-19 DIAGNOSIS — Y939 Activity, unspecified: Secondary | ICD-10-CM | POA: Insufficient documentation

## 2016-01-19 DIAGNOSIS — S161XXA Strain of muscle, fascia and tendon at neck level, initial encounter: Secondary | ICD-10-CM | POA: Insufficient documentation

## 2016-01-19 DIAGNOSIS — Y999 Unspecified external cause status: Secondary | ICD-10-CM | POA: Insufficient documentation

## 2016-01-19 DIAGNOSIS — S199XXA Unspecified injury of neck, initial encounter: Secondary | ICD-10-CM | POA: Diagnosis present
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
## 2016-01-19 MED ORDER — KETOROLAC TROMETHAMINE 60 MG/2ML IM SOLN
60.0000 mg | Freq: Once | INTRAMUSCULAR | Status: AC
  Administered 2016-01-19: 60 mg via INTRAMUSCULAR
  Filled 2016-01-19: qty 2

## 2016-01-19 MED ORDER — METHOCARBAMOL 750 MG PO TABS: 750.0000 mg | ORAL_TABLET | Freq: Four times a day (QID) | ORAL | 0 refills | Status: DC

## 2016-01-19 MED ORDER — IBUPROFEN 800 MG PO TABS: 800.0000 mg | ORAL_TABLET | Freq: Three times a day (TID) | ORAL | 0 refills | Status: DC | PRN
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
## 2016-04-17 DIAGNOSIS — F200 Paranoid schizophrenia: Secondary | ICD-10-CM | POA: Diagnosis not present

## 2016-10-07 ENCOUNTER — Emergency Department
Admission: EM | Admit: 2016-10-07 | Discharge: 2016-10-07 | Disposition: A | Discharge status: Home / Self Care | Payer: Medicare Other | Attending: Emergency Medicine | Admitting: Emergency Medicine

## 2016-10-07 ENCOUNTER — Encounter: Payer: Self-pay | Admitting: Emergency Medicine

## 2016-10-07 ENCOUNTER — Emergency Department: Payer: Medicare Other

## 2016-10-07 DIAGNOSIS — R4182 Altered mental status, unspecified: Secondary | ICD-10-CM | POA: Diagnosis present

## 2016-10-07 DIAGNOSIS — Z5181 Encounter for therapeutic drug level monitoring: Secondary | ICD-10-CM | POA: Diagnosis not present

## 2016-10-07 DIAGNOSIS — F17200 Nicotine dependence, unspecified, uncomplicated: Secondary | ICD-10-CM | POA: Diagnosis not present

## 2016-10-07 DIAGNOSIS — R410 Disorientation, unspecified: Secondary | ICD-10-CM | POA: Insufficient documentation

## 2016-10-07 DIAGNOSIS — R569 Unspecified convulsions: Secondary | ICD-10-CM | POA: Diagnosis not present

## 2016-10-07 DIAGNOSIS — R112 Nausea with vomiting, unspecified: Secondary | ICD-10-CM | POA: Diagnosis not present

## 2016-10-07 LAB — URINALYSIS, ROUTINE W REFLEX MICROSCOPIC
BACTERIA UA: NONE SEEN
BILIRUBIN URINE: NEGATIVE
Glucose, UA: NEGATIVE mg/dL
KETONES UR: 5 mg/dL — AB
Leukocytes, UA: NEGATIVE
Nitrite: NEGATIVE
PROTEIN: 30 mg/dL — AB
RBC / HPF: NONE SEEN RBC/hpf (ref 0–5)
SQUAMOUS EPITHELIAL / LPF: NONE SEEN
Specific Gravity, Urine: 1.021 (ref 1.005–1.030)
pH: 5 (ref 5.0–8.0)

## 2016-10-07 LAB — COMPREHENSIVE METABOLIC PANEL
ALK PHOS: 57 U/L (ref 38–126)
ALT: 16 U/L — AB (ref 17–63)
AST: 30 U/L (ref 15–41)
Albumin: 3.9 g/dL (ref 3.5–5.0)
Anion gap: 8 (ref 5–15)
BUN: 16 mg/dL (ref 6–20)
CALCIUM: 9.2 mg/dL (ref 8.9–10.3)
CO2: 24 mmol/L (ref 22–32)
CREATININE: 1.26 mg/dL — AB (ref 0.61–1.24)
Chloride: 109 mmol/L (ref 101–111)
GFR calc non Af Amer: >60 mL/min (ref >60)
Glucose, Bld: 103 mg/dL — ABNORMAL HIGH (ref 65–99)
Potassium: 4 mmol/L (ref 3.5–5.1)
SODIUM: 141 mmol/L (ref 135–145)
Total Bilirubin: 0.6 mg/dL (ref 0.3–1.2)
Total Protein: 6.6 g/dL (ref 6.5–8.1)

## 2016-10-07 LAB — URINE DRUG SCREEN, QUALITATIVE (ARMC ONLY)
Amphetamines, Ur Screen: NOT DETECTED
Barbiturates, Ur Screen: NOT DETECTED
Benzodiazepine, Ur Scrn: NOT DETECTED
CANNABINOID 50 NG, UR ~~LOC~~: POSITIVE — AB
COCAINE METABOLITE, UR ~~LOC~~: POSITIVE — AB
MDMA (ECSTASY) UR SCREEN: NOT DETECTED
Methadone Scn, Ur: NOT DETECTED
OPIATE, UR SCREEN: NOT DETECTED
PHENCYCLIDINE (PCP) UR S: NOT DETECTED
Tricyclic, Ur Screen: NOT DETECTED

## 2016-10-07 LAB — CBC WITH DIFFERENTIAL/PLATELET
Basophils Absolute: 0.1 10*3/uL (ref 0–0.1)
Basophils Relative: 1 %
EOS ABS: 0.1 10*3/uL (ref 0–0.7)
Eosinophils Relative: 1 %
HCT: 44.8 % (ref 40.0–52.0)
HEMOGLOBIN: 14.7 g/dL (ref 13.0–18.0)
LYMPHS ABS: 1.2 10*3/uL (ref 1.0–3.6)
LYMPHS PCT: 14 %
MCH: 29.5 pg (ref 26.0–34.0)
MCHC: 32.8 g/dL (ref 32.0–36.0)
MCV: 90 fL (ref 80.0–100.0)
Monocytes Absolute: 0.7 10*3/uL (ref 0.2–1.0)
Monocytes Relative: 8 %
NEUTROS PCT: 76 %
Neutro Abs: 6.5 10*3/uL (ref 1.4–6.5)
Platelets: 149 10*3/uL — ABNORMAL LOW (ref 150–440)
RBC: 4.98 MIL/uL (ref 4.40–5.90)
RDW: 16.5 % — ABNORMAL HIGH (ref 11.5–14.5)
WBC: 8.5 10*3/uL (ref 3.8–10.6)

## 2016-10-07 LAB — ETHANOL

## 2016-10-07 LAB — LIPASE, BLOOD: LIPASE: 30 U/L (ref 11–51)

## 2016-10-07 LAB — TROPONIN I: Troponin I: <0.03 ng/mL (ref <0.03)

## 2016-10-07 IMAGING — CT CT HEAD W/O CM
3 series · 15 of 47 positions shown, 18 images · non-contrast
Comparison: None.

CLINICAL DATA: Confusion and vomiting with questionable seizure
earlier today

EXAM:
CT HEAD WITHOUT CONTRAST
TECHNIQUE: Contiguous axial images were obtained from the base of the skull
through the vertex without intravenous contrast.

[Series 2: head wo · axial · 0.47mm/px · z∈[-187,-42]mm · 9 of 35 slices shown, 12 images]
[im 3/35  brain]
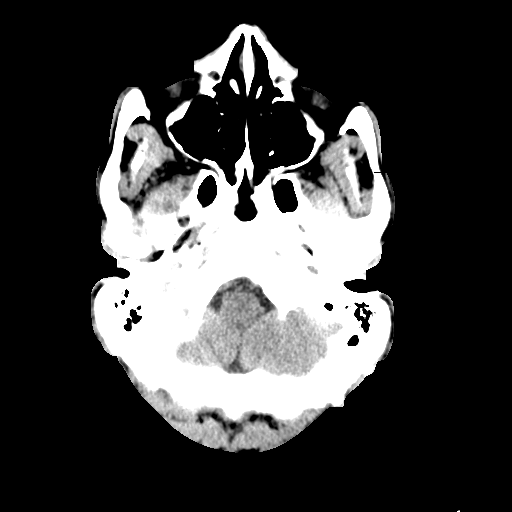
[im 3/35  bone]
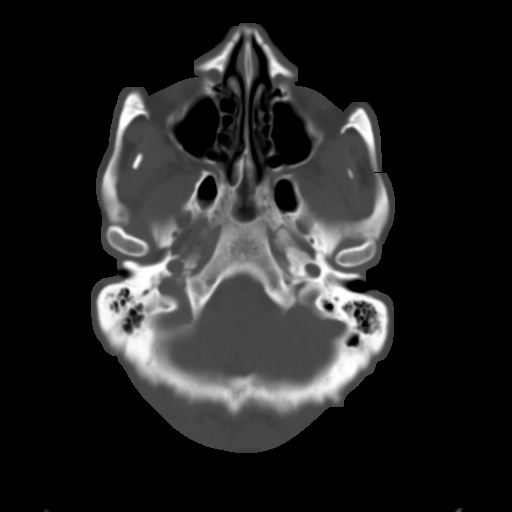
[im 6/35  brain]
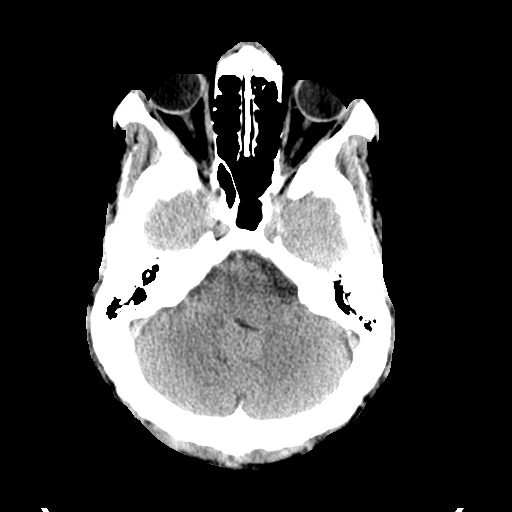
[im 10/35  brain]
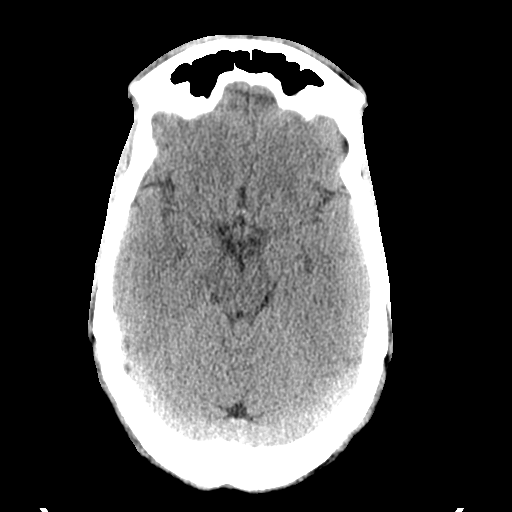
[im 13/35  brain]
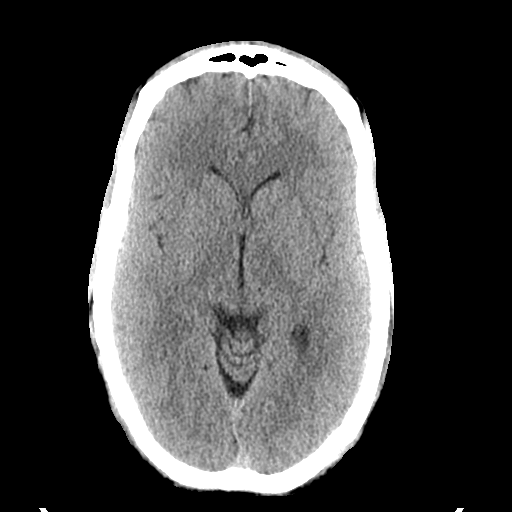
[im 18/35  brain]
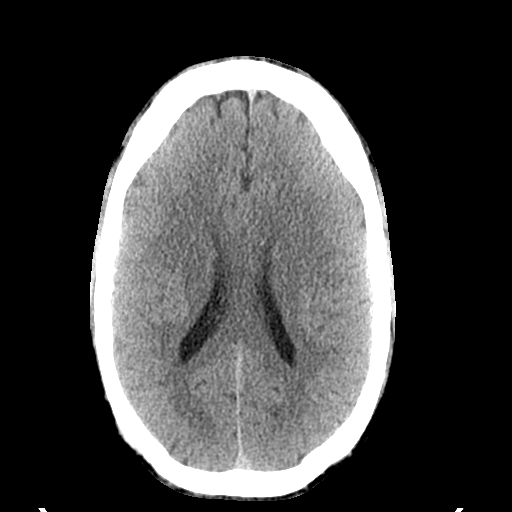
[im 18/35  bone]
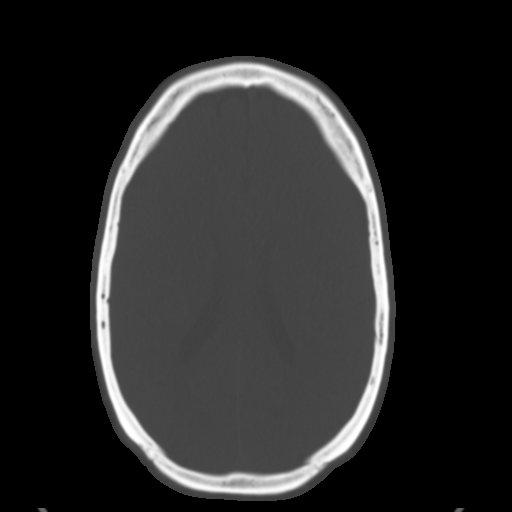
[im 22/35  brain]
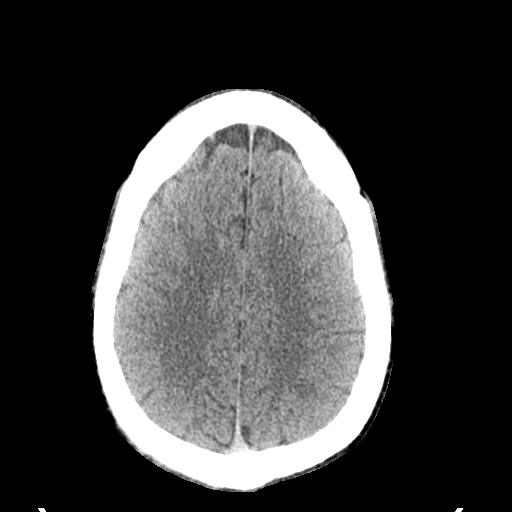
[im 25/35  brain]
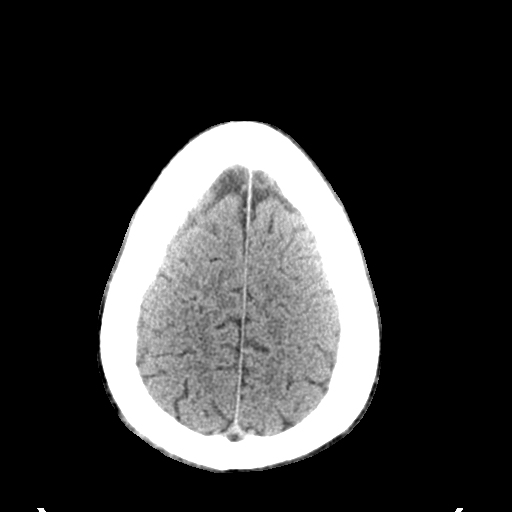
[im 29/35  brain]
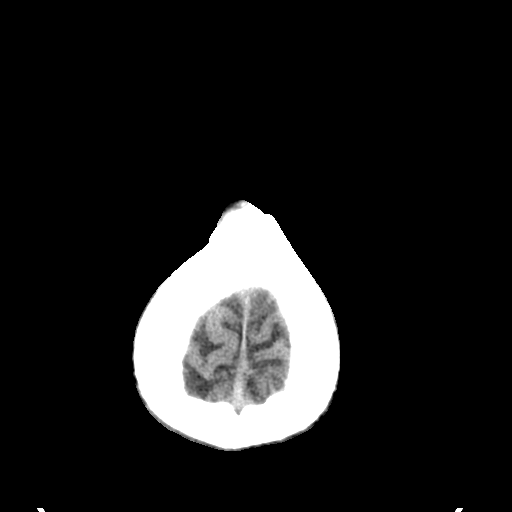
[im 32/35  brain]
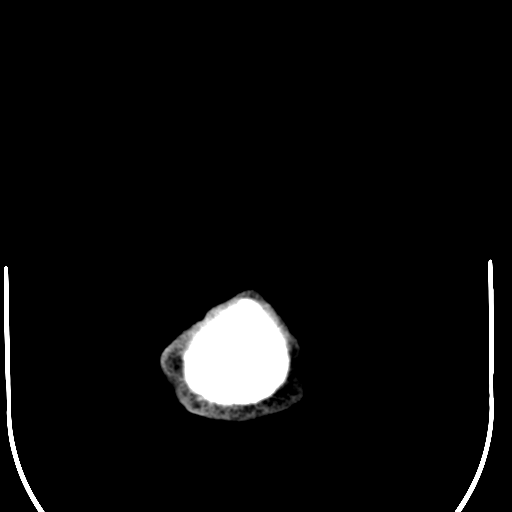
[im 32/35  bone]
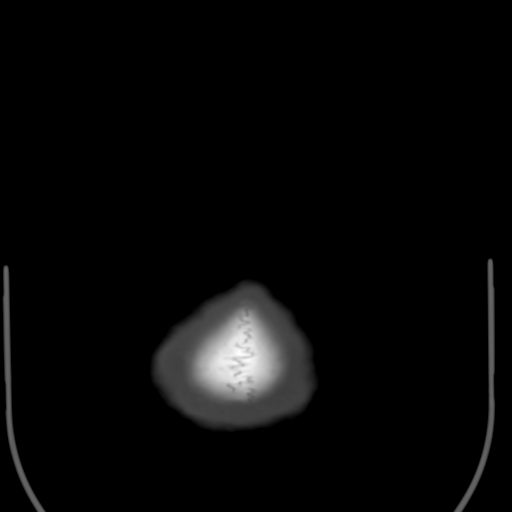

[Series 4: coronal soft tissue · coronal · 0.31mm/px · 3 of 77 slices shown]
[im 26/77  brain]
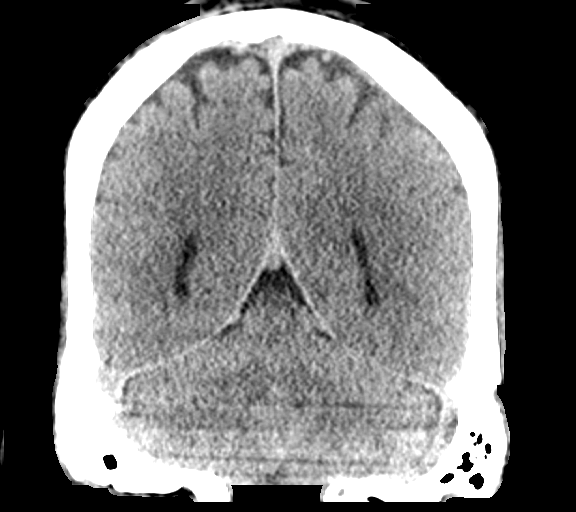
[im 34/77  brain]
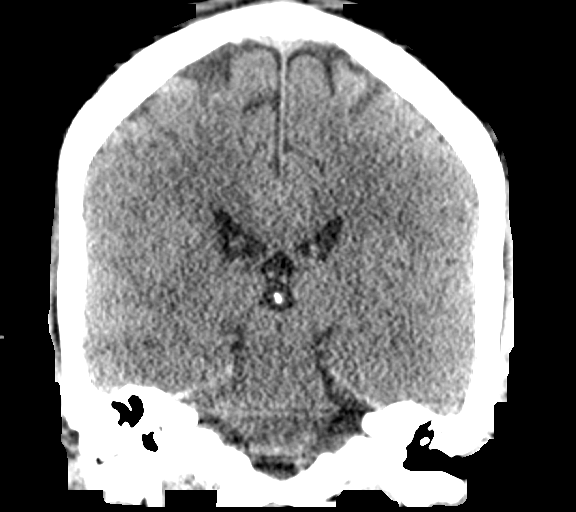
[im 43/77  brain]
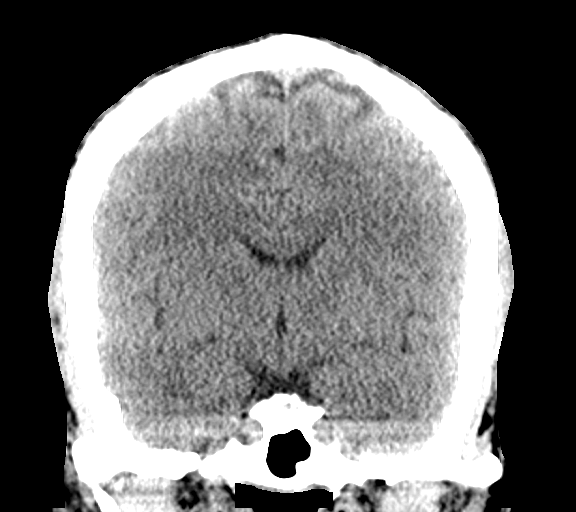

[Series 5: sagittal soft tissue · sagittal · 0.34mm/px · 3 of 53 slices shown]
[im 18/53  brain]
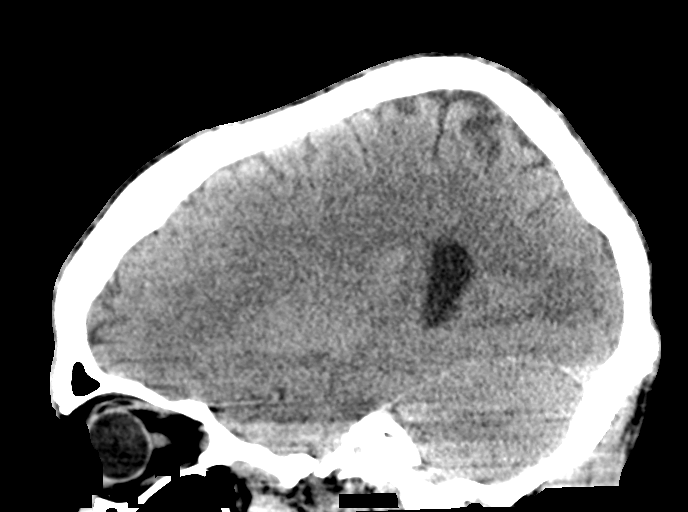
[im 27/53  brain]
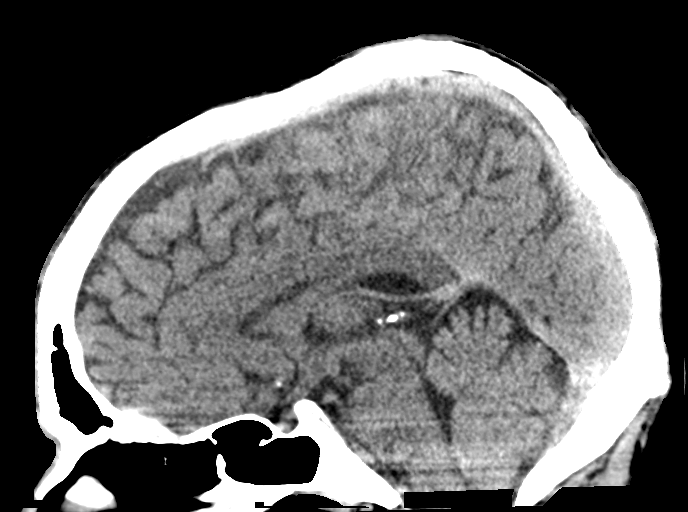
[im 35/53  brain]
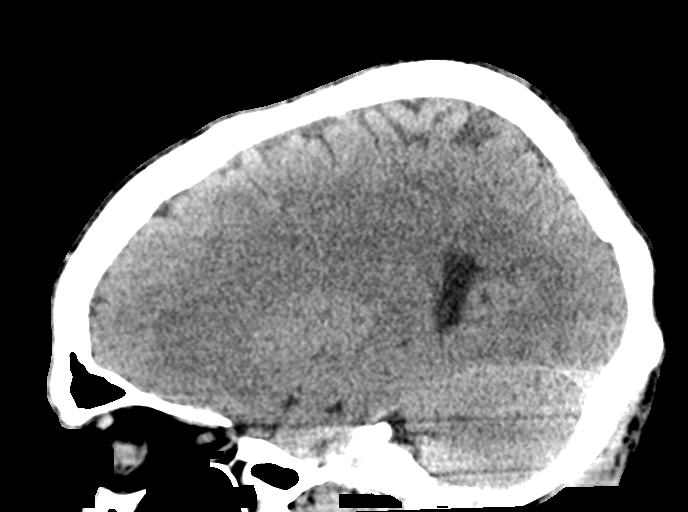

[15 of 47 positions shown; findings below may reference images not displayed]

FINDINGS: Brain: The ventricles are normal in size and configuration. There is
no intracranial mass, hemorrhage, extra-axial fluid collection, or
midline shift. The gray-white compartments are normal. No acute
infarct is evident.

Vascular: No hyperdense vessel. No vascular calcification
appreciable.

Skull: The bony calvarium appears intact.

Sinuses/Orbits: There is slight mucosal thickening in the lateral
left maxillary antrum. There is slight mucosal thickening in several
ethmoid air cells bilaterally. Other visualized paranasal sinuses
are clear. Orbits appear symmetric bilaterally.

Other: Mastoid air cells are clear.
IMPRESSION: Slight paranasal sinus disease. No intracranial mass, hemorrhage, or
extra-axial fluid collection. Gray-white compartments appear normal.

## 2016-10-07 MED ORDER — ONDANSETRON HCL 4 MG/2ML IJ SOLN
4.0000 mg | Freq: Once | INTRAMUSCULAR | Status: AC
  Administered 2016-10-07: 4 mg via INTRAVENOUS
  Filled 2016-10-07: qty 2

## 2016-10-07 MED ORDER — SODIUM CHLORIDE 0.9 % IV BOLUS (SEPSIS)
1000.0000 mL | Freq: Once | INTRAVENOUS | Status: AC
  Administered 2016-10-07: 1000 mL via INTRAVENOUS

## 2016-10-07 NOTE — Discharge Instructions (Signed)
Please follow-up with your primary care doctor this week. Please call neurology today to arrange a follow-up appointment as soon as possible. Return to the emergency department for any further seizure-like activity, or any other symptom personally concerning to yourself. Do not drive until you have been seen by neurology.

## 2016-10-07 NOTE — ED Provider Notes (Signed)
[facility] Emergency Department Provider Note  Time seen: 10:00 AM  I have reviewed the triage vital signs and the nursing notes.   HISTORY  Chief Complaint Altered Mental Status    HPI Luis Hunter is a 41 y.o. male with a past medical history of bipolar, schizophrenia, presents to the emergency department with confusion.According to EMS they were called out to the residents for seizure-like activity. Upon arrival they noted the patient to be quite postictal, unable to answer questions. They state in route to the hospital the patient began complaining of upper abdominal pain and nausea. He vomited and then said the pain was gone. Upon arrival the patient is awake alert, answering questions appropriately. Denies any complaints at this time. States he was having abdominal pain but states it went away after he vomited. Patient does not recall what happened this morning. Patient states he has had seizures previously but it is been many years since his last seizure. EMS transmitted the patient's EKG due to concerns for possible ST elevation, patient denies any chest pain or trouble breathing.  Past Medical History:  Diagnosis Date  . Bipolar disorder ([place])   . Depression   . Schizophrenia [facility])     Patient Active Problem List   Diagnosis Date Noted  . BPPV (benign paroxysmal positional vertigo) 11/16/2015  . Dizziness 11/16/2015    Past Surgical History:  Procedure Laterality Date  . FINGER SURGERY      Prior to Admission medications   Medication Sig Start Date End Date Taking? Authorizing Provider  ABILIFY MAINTENA 400 MG SUSR  11/08/15   [provider]  ibuprofen (ADVIL,MOTRIN) 800 MG tablet Take 1 tablet (800 mg total) by mouth every 8 (eight) hours as needed. 01/19/16   Beers, Pierce Crane, PA-C  meclizine (ANTIVERT) 25 MG tablet Take 1 tablet (25 mg total) by mouth 3 (three) times daily as needed for dizziness. 11/16/15   Roselee Nova, MD   methocarbamol (ROBAXIN) 750 MG tablet Take 1 tablet (750 mg total) by mouth 4 (four) times daily. 01/19/16   Beers, Pierce Crane, PA-C    No Known Allergies  No family history on file.  Social History Social History  Substance Use Topics  . Smoking status: Current Every Day Smoker  . Smokeless tobacco: Never Used  . Alcohol use No    Review of Systems Constitutional: Negative for fever. Eyes: Negative for visual changes. ENT: Negative for congestion Cardiovascular: Negative for chest pain. Respiratory: Negative for shortness of breath. Gastrointestinal: Upper abdominal pain, nausea and vomiting, now resolved. Negative for diarrhea. Genitourinary: Negative for dysuria. Musculoskeletal: Negative for back pain. Skin: Negative for rash. Neurological: Mild headache All other ROS negative  ____________________________________________   PHYSICAL EXAM:  VITAL SIGNS: ED Triage Vitals  Enc Vitals Group     BP 10/07/16 0954 133/83     Pulse Rate 10/07/16 0954 75     Resp 10/07/16 0954 18     Temp 10/07/16 0954 97.7 F (36.5 C)     Temp Source 10/07/16 0954 Oral     SpO2 10/07/16 0954 97 %     Weight 10/07/16 0958 170 lb (77.1 kg)     Height 10/07/16 0958 6' (1.829 m)     Head Circumference --      Peak Flow --      Pain Score 10/07/16 0954 2     Pain Loc --      Pain Edu? --  Excl. in [place]? --     Constitutional: Alert and oriented. Well appearing and in no distress. Eyes: Normal exam ENT   Head: Normocephalic and atraumatic.   Mouth/Throat: Mucous membranes are moist.No oral injuries. Cardiovascular: Normal rate, regular rhythm. No murmur Respiratory: Normal respiratory effort without tachypnea nor retractions. Breath sounds are clear Gastrointestinal: Soft and nontender. No distention.   Musculoskeletal: Nontender with normal range of motion in all extremities. No edema. Neurologic:  Normal speech and language. No gross focal neurologic deficits  Skin:   Skin is warm, dry and intact.  Psychiatric: Mood and affect are normal.   ____________________________________________    EKG  EKG reviewed and interpreted by myself appears to show sinus rhythm at 71 bpm with a narrow QRS, normal axis, normal intervals. Patient does have mild ST elevation in the inferolateral leads most consistent with J-point during. No reciprocal changes.  ____________________________________________    RADIOLOGY  IMPRESSION: Slight paranasal sinus disease. No intracranial mass, hemorrhage, or extra-axial fluid collection. Gray-white compartments appear normal.  ____________________________________________   INITIAL IMPRESSION / ASSESSMENT AND PLAN / ED COURSE  Pertinent labs & imaging results that were available during my care of the patient were reviewed by me and considered in my medical decision making (see chart for details).  The patient presents the emergency department after a possible seizure at home. EMS states initially postictal but upon arrival patient is awake alert oriented, answering questions properly but does not recall the events of this morning. We will check labs, urinalysis. Given a seizure without having a seizure and a long time per patient we will obtain a CT scan of the head. We'll continue close monitoring in the emergency department. Patient's EKG does show what appears to be J-point/early repolarization, we will check a troponin as a precaution. Patient denies any chest pain at any point.  Patient's workup has been largely normal. Patient continues to feel well in the emergency department. I suspect a likely seizure although this is not entirely clear. I recommended the patient follow up with neurology as well as his primary care doctor. At this time we will hold off on starting antiepileptic medications.  ____________________________________________   FINAL CLINICAL IMPRESSION(S) / ED DIAGNOSES  Seizure    Harvest Dark,  MD 10/07/16 1435

## 2016-10-07 NOTE — ED Triage Notes (Signed)
Per report patient was agitated at home, shaking, possible seizure. Had post ictal period after. Earlier complained of abd pain but now states resolved to minimal. Vomited en route. Able to answer questions now but states unable to remember events of today or yesterday.

## 2016-10-07 NOTE — ED Notes (Signed)
Provides urine spec, states feels better after resting.

## 2016-10-07 NOTE — ED Notes (Signed)
Patient states unable to provide urine specimen. Will try again when IVF infused. Patient was noted incontinent of large amount of urine on arrival.

## 2016-10-14 ENCOUNTER — Ambulatory Visit: Payer: Medicare Other | Admitting: Family Medicine

## 2016-11-16 ENCOUNTER — Emergency Department
Admission: EM | Admit: 2016-11-16 | Discharge: 2016-11-16 | Disposition: A | Discharge status: Home / Self Care | Payer: Medicare Other | Attending: Emergency Medicine | Admitting: Emergency Medicine

## 2016-11-16 ENCOUNTER — Encounter: Payer: Self-pay | Admitting: Emergency Medicine

## 2016-11-16 DIAGNOSIS — Z79899 Other long term (current) drug therapy: Secondary | ICD-10-CM | POA: Diagnosis not present

## 2016-11-16 DIAGNOSIS — R569 Unspecified convulsions: Secondary | ICD-10-CM

## 2016-11-16 DIAGNOSIS — F17200 Nicotine dependence, unspecified, uncomplicated: Secondary | ICD-10-CM | POA: Insufficient documentation

## 2016-11-16 LAB — CBC
HEMATOCRIT: 43 % (ref 40.0–52.0)
HEMOGLOBIN: 14.2 g/dL (ref 13.0–18.0)
MCH: 30 pg (ref 26.0–34.0)
MCHC: 33.1 g/dL (ref 32.0–36.0)
MCV: 90.7 fL (ref 80.0–100.0)
Platelets: 143 10*3/uL — ABNORMAL LOW (ref 150–440)
RBC: 4.74 MIL/uL (ref 4.40–5.90)
RDW: 15.8 % — ABNORMAL HIGH (ref 11.5–14.5)
WBC: 11.8 10*3/uL — ABNORMAL HIGH (ref 3.8–10.6)

## 2016-11-16 LAB — BASIC METABOLIC PANEL
Anion gap: 10 (ref 5–15)
BUN: 12 mg/dL (ref 6–20)
CHLORIDE: 107 mmol/L (ref 101–111)
CO2: 25 mmol/L (ref 22–32)
CREATININE: 1.17 mg/dL (ref 0.61–1.24)
Calcium: 9.5 mg/dL (ref 8.9–10.3)
GFR calc non Af Amer: >60 mL/min (ref >60)
Glucose, Bld: 113 mg/dL — ABNORMAL HIGH (ref 65–99)
Potassium: 4 mmol/L (ref 3.5–5.1)
Sodium: 142 mmol/L (ref 135–145)

## 2016-11-16 LAB — GLUCOSE, CAPILLARY: Glucose-Capillary: 102 mg/dL — ABNORMAL HIGH (ref 65–99)

## 2016-11-16 MED ORDER — LEVETIRACETAM 500 MG PO TABS: 500.0000 mg | ORAL_TABLET | Freq: Two times a day (BID) | ORAL | 0 refills | Status: DC

## 2016-11-16 MED ORDER — SODIUM CHLORIDE 0.9 % IV SOLN
1000.0000 mg | Freq: Once | INTRAVENOUS | Status: AC
  Administered 2016-11-16: 1000 mg via INTRAVENOUS
  Filled 2016-11-16: qty 10

## 2016-11-16 NOTE — ED Notes (Signed)
No change in patient condition. Pt noted to be laying on R side with blankets pulled up over him, awakens with verbal stimuli and answers questions appropriately. VSS and WNL. Will continue to monitor for further patient needs.

## 2016-11-16 NOTE — ED Provider Notes (Signed)
[facility] Emergency Department Provider Note   ____________________________________________   First MD Initiated Contact with Patient 11/16/16 [phone]     (approximate)  I have reviewed the triage vital signs and the nursing notes.   HISTORY  Chief Complaint Seizures    HPI Luis Hunter is a 41 y.o. male here for evaluation after a seizure  Patient reports that he was up, and then recalls awakening on the floor having struck his chin and bit his tongue. He reports that he has a history of a seizure about a month ago, thinks the same today. EMS reports that patient had a witnessed seizure where he fell, had a generalized seizure activity, and then was postictal of mental processes have cleared now.  The patient denies any ongoing concern except he feels chilled. Denies any nausea or vomiting. No headache. Does report his tongue feels sore where he bit it and also thinks he bit his inner lip.  Does not take any medicine for seizures. This occurred once before in the past and he was seen here at the hospital ER for the same.  He does occasionally use cocaine.The only medicine is Abilify. Denies any numbness or weakness in arm or leg. No trouble speaking. No neck pain   Past Medical History:  Diagnosis Date  . Bipolar disorder ([place])   . Depression   . Schizophrenia [facility])     Patient Active Problem List   Diagnosis Date Noted  . BPPV (benign paroxysmal positional vertigo) 11/16/2015  . Dizziness 11/16/2015    Past Surgical History:  Procedure Laterality Date  . FINGER SURGERY      Prior to Admission medications   Medication Sig Start Date End Date Taking? Authorizing Provider  ABILIFY MAINTENA 400 MG SUSR  11/08/15   [provider]  ibuprofen (ADVIL,MOTRIN) 800 MG tablet Take 1 tablet (800 mg total) by mouth every 8 (eight) hours as needed. 01/19/16   Beers, Pierce Crane, PA-C  levETIRAcetam (KEPPRA) 500 MG tablet Take 1 tablet (500 mg  total) by mouth 2 (two) times daily. 11/16/16   Delman Kitten, MD  meclizine (ANTIVERT) 25 MG tablet Take 1 tablet (25 mg total) by mouth 3 (three) times daily as needed for dizziness. 11/16/15   Roselee Nova, MD  methocarbamol (ROBAXIN) 750 MG tablet Take 1 tablet (750 mg total) by mouth 4 (four) times daily. 01/19/16   Beers, Pierce Crane, PA-C    Allergies Patient has no known allergies.  History reviewed. No pertinent family history.  Social History Social History  Substance Use Topics  . Smoking status: Current Every Day Smoker  . Smokeless tobacco: Never Used  . Alcohol use No    Review of Systems Constitutional: No fever/chills. Denies any recent illness, was in his normal health until this occurred this morning Eyes: No visual changes. ENT: No sore throat.See history of present illness Cardiovascular: Denies chest pain. Respiratory: Denies shortness of breath. Gastrointestinal: No abdominal pain.  No nausea, no vomiting.  No diarrhea.  No constipation. Genitourinary: Negative for dysuria. Musculoskeletal: Negative for back pain. Skin: Negative for rash. Neurological: Negative for headaches, focal weakness or numbness.    ____________________________________________   PHYSICAL EXAM:  VITAL SIGNS: ED Triage Vitals  Enc Vitals Group     BP 11/16/16 0724 107/62     Pulse Rate 11/16/16 0724 77     Resp 11/16/16 0724 15     Temp 11/16/16 0724 98.3 F (36.8 C)  Temp Source 11/16/16 0724 Oral     SpO2 11/16/16 0724 98 %     Weight 11/16/16 0726 170 lb (77.1 kg)     Height 11/16/16 0726 6' (1.829 m)     Head Circumference --      Peak Flow --      Pain Score --      Pain Loc --      Pain Edu? --      Excl. in [place]? --     Constitutional: Alert and oriented. Well appearing and in no acute distress. Eyes: Conjunctivae are normal. Head: Atraumatic except for a small less than half centimeter laceration in the left inner lip mucosa that does not extend through and  through. Nose: No congestion/rhinnorhea. Mouth/Throat: Mucous membranes are moist. Neck: No stridor.  No midline cervical tenderness. Cardiovascular: Normal rate, regular rhythm. Grossly normal heart sounds.  Good peripheral circulation. Respiratory: Normal respiratory effort.  No retractions. Lungs CTAB. Gastrointestinal: Soft and nontender. No distention. Musculoskeletal: No lower extremity tenderness nor edema. Neurologic:  Normal speech and language. No gross focal neurologic deficits are appreciated. Moves all extremities normally. No pronator drift. Normal cranial nerve exam. No ataxia. Speech is clear. Skin:  Skin is warm, dry and intact. No rash noted. Psychiatric: Mood and affect are normal. Speech and behavior are normal.  ____________________________________________   LABS (all labs ordered are listed, but only abnormal results are displayed)  Labs Reviewed  CBC - Abnormal; Notable for the following:       Result Value   WBC 11.8 (*)    RDW 15.8 (*)    Platelets 143 (*)    All other components within normal limits  BASIC METABOLIC PANEL - Abnormal; Notable for the following:    Glucose, Bld 113 (*)    All other components within normal limits  GLUCOSE, CAPILLARY - Abnormal; Notable for the following:    Glucose-Capillary 102 (*)    All other components within normal limits   ____________________________________________  EKG  Reviewed and interpreted by me at 7:30 AM Heart rate 75 Care is 100 QTc 4:30 Normal sinus rhythm, mild ST abnormality noted throughout multiple leads, likely repolarization abnormality. As compared with patient's previous EKG from 10/07/2016, no changes found. LVH ____________________________________________  RADIOLOGY  Reviewed previous head CT. He is awake and alert, no clear evidence for repeat imaging today. Has had previous CT scan less than 2 months ago when he first presented with a seizure. No evidence of major trauma. Takes no  anticoagulants. Denies headache. ____________________________________________   PROCEDURES  Procedure(s) performed: None  Procedures  Critical Care performed: No  ____________________________________________   INITIAL IMPRESSION / ASSESSMENT AND PLAN / ED COURSE  Pertinent labs & imaging results that were available during my care of the patient were reviewed by me and considered in my medical decision making (see chart for details).  Seizure. Appears likely witnessed seizure. He does concomitantly use cocaine, may be contributory. Additionally patient is hemodynamics stable, mental status has cleared, and he denies any  concerns. Discussed with neurolgy (Dr. Anice Paganini) who recommends the patient be placed on Keppra 500 mg twice daily now, with a 1 g load in the ER. Close follow-up with neurology recommended   ----------------------------------------- 9:42 AM on 11/16/2016 -----------------------------------------  Patient awake and alert. Resting comfortably. He has no complaint at present. Reviewed discharge plan in the medication prescription, also general seizure precautions with the patient is agreeable. Appears appropriate, well oriented, stable hemodynamics for discharge. ____________________________________________  FINAL CLINICAL IMPRESSION(S) / ED DIAGNOSES  Final diagnoses:  Seizure ([place])      NEW MEDICATIONS STARTED DURING THIS VISIT:  New Prescriptions   LEVETIRACETAM (KEPPRA) 500 MG TABLET    Take 1 tablet (500 mg total) by mouth 2 (two) times daily.     Note:  This document was prepared using Dragon voice recognition software and may include unintentional dictation errors.     Delman Kitten, MD 11/16/16 [phone]

## 2016-11-16 NOTE — ED Notes (Signed)
This RN to bedside at this time, pt awakens with verbal stimuli but is difficult to keep awake, explained to patient that he would need to call for a ride due to impending D/C, pt then fell back asleep. Will notify MD, pt is otherwise visualized in NAD. Will continue to monitor for further patient needs.

## 2016-11-16 NOTE — ED Notes (Addendum)
Patient awake and alert talking to this nurse.  Ambulated around room with minimal assistance.  Patient wheeled to lobby where friend was waiting.

## 2016-11-16 NOTE — ED Notes (Signed)
Report given to Stephen, RN 

## 2016-11-16 NOTE — ED Notes (Signed)
Pt unable to get in touch with ride for discharge.  Encouraged to keep trying.

## 2016-11-16 NOTE — Discharge Instructions (Signed)
You have been seen in the emergency department today for a likely seizure.  Your workup today including labs are within normal limits.  Please follow up with your doctor as soon as possible regarding today's emergency department visit and your likely seizure.  You will also need to follow up with a neurologist as soon as possible, please call for appointment.  If you have been prescribed a medication for your seizures, please take this medication as prescribed.  As we have discussed it is very important that you DO NOT drive until you have been seen and cleared by your neurologist.  Please drink plenty of fluids, get plenty of sleep and avoid any alcohol or drug use (including cocaine)  Return to the emergency department if you have any further seizures, develop any weakness/numbness of any arm/leg, confusion, slurred speech, or sudden/severe headache.

## 2016-11-16 NOTE — ED Notes (Signed)
CBG 102 

## 2016-11-16 NOTE — ED Triage Notes (Signed)
Pt presents to ED via [facility]. Per EMS pt had 2 witnessed seizures this morning that were witnessed by family, EMS reports family unable to state how long seizures lasted but report that patient has grand mal seizures. EMS reports that patient has hx of bipolar and schizophrenia as well, but that patient denies taking any medications. EMS states that until approx 3-4 mins PTA to hospital, pt was combative, attempting to get up off stretcher and leave, upon arrival patient is calm and cooperative. Pt is alert and oriented to person and place, disoriented to time. VSS and WNL per EMS.

## 2016-11-16 NOTE — ED Notes (Signed)
Friend of patient contacted on phone to pick up patient for discharge.  Phone number: [phone].

## 2016-11-19 ENCOUNTER — Encounter: Payer: Self-pay | Admitting: Neurology

## 2016-11-19 ENCOUNTER — Ambulatory Visit (INDEPENDENT_AMBULATORY_CARE_PROVIDER_SITE_OTHER): Payer: Medicare Other | Admitting: Family Medicine

## 2016-11-19 ENCOUNTER — Encounter: Payer: Self-pay | Admitting: Family Medicine

## 2016-11-19 VITALS — BP 106/60 | HR 78 | Temp 97.5°F | Resp 16 | Ht 72.0 in | Wt 161.7 lb

## 2016-11-19 DIAGNOSIS — R569 Unspecified convulsions: Secondary | ICD-10-CM | POA: Diagnosis not present

## 2016-11-19 DIAGNOSIS — D72829 Elevated white blood cell count, unspecified: Secondary | ICD-10-CM | POA: Diagnosis not present

## 2016-11-19 DIAGNOSIS — R825 Elevated urine levels of drugs, medicaments and biological substances: Secondary | ICD-10-CM | POA: Diagnosis not present

## 2016-11-19 DIAGNOSIS — D696 Thrombocytopenia, unspecified: Secondary | ICD-10-CM

## 2016-11-19 LAB — CBC WITH DIFFERENTIAL/PLATELET
BASOS PCT: 0 %
Basophils Absolute: 0 cells/uL (ref 0–200)
EOS PCT: 2 %
Eosinophils Absolute: 186 cells/uL (ref 15–500)
HEMATOCRIT: 41 % (ref 38.5–50.0)
HEMOGLOBIN: 13.9 g/dL (ref 13.2–17.1)
LYMPHS ABS: 1953 {cells}/uL (ref 850–3900)
Lymphocytes Relative: 21 %
MCH: 30.2 pg (ref 27.0–33.0)
MCHC: 33.9 g/dL (ref 32.0–36.0)
MCV: 89.1 fL (ref 80.0–100.0)
MONO ABS: 837 {cells}/uL (ref 200–950)
MPV: 10.4 fL (ref 7.5–12.5)
Monocytes Relative: 9 %
NEUTROS PCT: 68 %
Neutro Abs: 6324 cells/uL (ref 1500–7800)
Platelets: 164 10*3/uL (ref 140–400)
RBC: 4.6 MIL/uL (ref 4.20–5.80)
RDW: 15.4 % — AB (ref 11.0–15.0)
WBC: 9.3 10*3/uL (ref 3.8–10.8)

## 2016-11-19 NOTE — Progress Notes (Signed)
Name: Luis Hunter   MRN: [id number]    DOB: [birth year]   Date:11/19/2016       Progress Note  Subjective  Chief Complaint  Chief Complaint  Patient presents with  . Hospitalization Follow-up    Seizures    HPI  Pt. Presents for ER follow up after having what appears to be witnessed but not substantiated seizure-like activity, both times at home. He was reportedly 'shaking', then went through the curtain and 'busted my head through the wall'. He was taken both times to the ER, initial work up was unremarkable but he has been asked to follow up with neurology.  Patient has no known history of epilepsy.  He has schizophrenia and bipolar disorder, being followed by Psychiatry  Past Medical History:  Diagnosis Date  . Bipolar disorder ([place])   . Depression   . Schizophrenia [facility])     Past Surgical History:  Procedure Laterality Date  . FINGER SURGERY      History reviewed. No pertinent family history.  Social History   Social History  . Marital status: Single    Spouse name: N/A  . Number of children: N/A  . Years of education: N/A   Occupational History  . Not on file.   Social History Main Topics  . Smoking status: Current Every Day Smoker  . Smokeless tobacco: Never Used  . Alcohol use No  . Drug use: Yes    Types: Marijuana  . Sexual activity: Not Currently   Other Topics Concern  . Not on file   Social History Narrative  . No narrative on file     Current Outpatient Prescriptions:  .  ABILIFY MAINTENA 400 MG SUSR, , Disp: , Rfl:  .  ibuprofen (ADVIL,MOTRIN) 800 MG tablet, Take 1 tablet (800 mg total) by mouth every 8 (eight) hours as needed., Disp: 30 tablet, Rfl: 0 .  levETIRAcetam (KEPPRA) 500 MG tablet, Take 1 tablet (500 mg total) by mouth 2 (two) times daily., Disp: 28 tablet, Rfl: 0 .  meclizine (ANTIVERT) 25 MG tablet, Take 1 tablet (25 mg total) by mouth 3 (three) times daily as needed for dizziness., Disp: 30 tablet, Rfl: 0 .  methocarbamol  (ROBAXIN) 750 MG tablet, Take 1 tablet (750 mg total) by mouth 4 (four) times daily. (Patient not taking: Reported on 11/19/2016), Disp: 40 tablet, Rfl: 0  No Known Allergies   Review of Systems  Constitutional: Positive for chills. Negative for fever and malaise/fatigue.  Eyes: Negative for blurred vision and double vision.  Cardiovascular: Negative for chest pain.  Neurological: Negative for dizziness, tingling, seizures, loss of consciousness and headaches.  Psychiatric/Behavioral: Positive for depression. The patient is nervous/anxious.      Objective  Vitals:   11/19/16 1118  BP: 106/60  Pulse: 78  Resp: 16  Temp: (!) 97.5 F (36.4 C)  TempSrc: Oral  SpO2: 97%  Weight: 161 lb 11.2 oz (73.3 kg)  Height: 6' (1.829 m)    Physical Exam  Constitutional: He is oriented to person, place, and time and well-developed, well-nourished, and in no distress.  HENT:  Head: Normocephalic and atraumatic.  Cardiovascular: Normal rate, regular rhythm and normal heart sounds.   No murmur heard. Pulmonary/Chest: Effort normal and breath sounds normal. He has no wheezes.  Abdominal: Soft. Bowel sounds are normal.  Neurological: He is alert and oriented to person, place, and time. He has normal strength. He displays no weakness, facial symmetry and normal speech.  Psychiatric: Mood, memory, affect  and judgment normal.  Nursing note and vitals reviewed.      Assessment & Plan  1. Witnessed seizure-like activity [facility]) Reviewed ER notes, will need referral to neurology for EEG - Ambulatory referral to Neurology  2. Leukocytosis, unspecified type Obtain CBC and follow-up - CBC with Differential/Platelet  3. Thrombocytopenia (HCC)  - CBC with Differential/Platelet  4. Positive urine drug screen Educated on the dangers of illegal drugs including cocaine and cannabinoids, patient was strongly advised to quit using all illegal drugs. He may need referral for counseling   Rynlee Lisbon Asad  [facility] 11/19/2016 11:28 AM

## 2016-12-03 DIAGNOSIS — R569 Unspecified convulsions: Secondary | ICD-10-CM | POA: Diagnosis not present

## 2016-12-20 ENCOUNTER — Ambulatory Visit: Payer: Medicare Other | Admitting: Family Medicine

## 2017-01-10 ENCOUNTER — Ambulatory Visit: Payer: Medicare Other | Admitting: Neurology

## 2017-01-22 ENCOUNTER — Emergency Department
Admission: EM | Admit: 2017-01-22 | Discharge: 2017-01-22 | Disposition: A | Discharge status: Home / Self Care | Payer: Medicare Other | Attending: Emergency Medicine | Admitting: Emergency Medicine

## 2017-01-22 DIAGNOSIS — Z79899 Other long term (current) drug therapy: Secondary | ICD-10-CM | POA: Diagnosis not present

## 2017-01-22 DIAGNOSIS — F17200 Nicotine dependence, unspecified, uncomplicated: Secondary | ICD-10-CM | POA: Insufficient documentation

## 2017-01-22 DIAGNOSIS — R569 Unspecified convulsions: Secondary | ICD-10-CM | POA: Diagnosis not present

## 2017-01-22 DIAGNOSIS — G40909 Epilepsy, unspecified, not intractable, without status epilepticus: Secondary | ICD-10-CM | POA: Insufficient documentation

## 2017-01-22 LAB — BASIC METABOLIC PANEL
Anion gap: 13 (ref 5–15)
BUN: 20 mg/dL (ref 6–20)
CALCIUM: 8.7 mg/dL — AB (ref 8.9–10.3)
CO2: 20 mmol/L — AB (ref 22–32)
CREATININE: 1.17 mg/dL (ref 0.61–1.24)
Chloride: 111 mmol/L (ref 101–111)
GFR calc Af Amer: >60 mL/min (ref >60)
GFR calc non Af Amer: >60 mL/min (ref >60)
Glucose, Bld: 116 mg/dL — ABNORMAL HIGH (ref 65–99)
Potassium: 3.7 mmol/L (ref 3.5–5.1)
Sodium: 144 mmol/L (ref 135–145)

## 2017-01-22 LAB — CBC
HCT: 43.7 % (ref 40.0–52.0)
Hemoglobin: 14.5 g/dL (ref 13.0–18.0)
MCH: 30.4 pg (ref 26.0–34.0)
MCHC: 33.1 g/dL (ref 32.0–36.0)
MCV: 91.9 fL (ref 80.0–100.0)
PLATELETS: 131 10*3/uL — AB (ref 150–440)
RBC: 4.75 MIL/uL (ref 4.40–5.90)
RDW: 16.3 % — AB (ref 11.5–14.5)
WBC: 12 10*3/uL — ABNORMAL HIGH (ref 3.8–10.6)

## 2017-01-22 MED ORDER — MIDAZOLAM HCL 2 MG/2ML IJ SOLN: 2.0000 mg | Freq: Once | INTRAMUSCULAR | Status: DC

## 2017-01-22 MED ORDER — LEVETIRACETAM 500 MG PO TABS: 500.0000 mg | ORAL_TABLET | Freq: Two times a day (BID) | ORAL | 0 refills | Status: DC

## 2017-01-22 MED ORDER — SODIUM CHLORIDE 0.9 % IV SOLN
1000.0000 mg | Freq: Once | INTRAVENOUS | Status: AC
  Administered 2017-01-22: 1000 mg via INTRAVENOUS
  Filled 2017-01-22: qty 10

## 2017-01-22 MED ORDER — MIDAZOLAM HCL 2 MG/2ML IJ SOLN
2.0000 mg | Freq: Once | INTRAMUSCULAR | Status: AC
  Administered 2017-01-22: 2 mg via INTRAVENOUS

## 2017-01-22 NOTE — ED Triage Notes (Signed)
Pt arrived to the ED via EMS for seizure. EMS received a call to the [facility] for seizures. When they got to the scene the Pt was AOx4 and refused transport. EMS left and received another call from the same place and when they got to the scene it was the same Pt and this time he accepted transport to the hospital. As EMS was arriving to the hospital the Pt began to seize. Pt received 4 of versed and still combative upon arrival to the hospital. Dr. Jacqualine Code at bedside upon arrival.

## 2017-01-22 NOTE — ED Provider Notes (Addendum)
[facility] Emergency Department Provider Note ____________________________________________   First MD Initiated Contact with Patient 01/22/17 [phone]     (approximate)  I have reviewed the triage vital signs and the nursing notes.   HISTORY  Chief Complaint Seizures  EM caveat: The patient actively having a seizure  HPI Luis Hunter is a 41 y.o. male history of seizure disorder, also schizophrenia and polysubstance abuse.  Patient had 911 called after witnesses felt he had a seizure while in bed. Paramedics evaluated him and he is alert and oriented, he did not wish to go and refused transport on first evaluation. Her call back after he had another seizure, on arrival they report that he was slightly postictal but then was alert, oriented and compliant with the requests and care. His vital signs are stable and blood glucose was normal. When they pulled into the ambulance bay, the patient had another witnessed seizure with foaming at the mouth and this lasted for about 1 minute in my presence as well.   Past Medical History:  Diagnosis Date  . Bipolar disorder ([place])   . Depression   . Schizophrenia [facility])     Patient Active Problem List   Diagnosis Date Noted  . BPPV (benign paroxysmal positional vertigo) 11/16/2015  . Dizziness 11/16/2015    Past Surgical History:  Procedure Laterality Date  . FINGER SURGERY      Prior to Admission medications   Medication Sig Start Date End Date Taking? Authorizing Provider  ABILIFY MAINTENA 400 MG SUSR  11/08/15   [provider]  ibuprofen (ADVIL,MOTRIN) 800 MG tablet Take 1 tablet (800 mg total) by mouth every 8 (eight) hours as needed. 01/19/16   Beers, Pierce Crane, PA-C  levETIRAcetam (KEPPRA) 500 MG tablet Take 1 tablet (500 mg total) by mouth 2 (two) times daily. 01/22/17   Delman Kitten, MD  meclizine (ANTIVERT) 25 MG tablet Take 1 tablet (25 mg total) by mouth 3 (three) times daily as needed for  dizziness. 11/16/15   Roselee Nova, MD    Allergies Patient has no known allergies.  No family history on file.  Social History Social History  Substance Use Topics  . Smoking status: Current Every Day Smoker  . Smokeless tobacco: Never Used  . Alcohol use No    Review of Systems  EM caveat    ____________________________________________   PHYSICAL EXAM:  VITAL SIGNS: ED Triage Vitals  Enc Vitals Group     BP 01/22/17 0501 107/68     Pulse Rate 01/22/17 0501 (!) 101     Resp 01/22/17 0501 16     Temp 01/22/17 0501 (!) 97.4 F (36.3 C)     Temp Source 01/22/17 0501 Axillary     SpO2 01/22/17 0501 95 %     Weight 01/22/17 0505 166 lb 14.2 oz (75.7 kg)     Height --      Head Circumference --      Peak Flow --      Pain Score --      Pain Loc --      Pain Edu? --      Excl. in [place]? --     Constitutional: agitated, initially seizure-like activity with full body thrashing with a tonic-clonic type presentation that lasted about 1 minute. Eyes: Conjunctivae are normal. Head: Atraumatic. Nose: No congestion/rhinnorhea. Mouth/Throat: Mucous membranes are moist. Neck: No stridor.   Cardiovascular: tachycardic rate, regular rhythm. Grossly normal heart sounds.  Good  peripheral circulation. Respiratory: Normal respiratory effort.  No retractions. Lungs CTAB.normal oxygen saturation on room air. Gastrointestinal: Soft and nontender. No distention. Musculoskeletal: No lower extremity tenderness nor edema. Neurologic:  initially tonic-clonic seizure lasting about 1 minute. Abated after Versed, then became agitated with purposeful movements but pushing back and fighting against staff and not following commands. He received additional Versed, this helped to calm him. He is not alert or oriented to this time, but showing signs of rapid improvement in mental status and no longer agitated or fighting staff.  Skin:  Skin is warm, sweaty and intact. No rash noted. Psychiatric:  Mood and affect are not able to be evaluated at this time  ____________________________________________   LABS (all labs ordered are listed, but only abnormal results are displayed)  Labs Reviewed  BASIC METABOLIC PANEL - Abnormal; Notable for the following:       Result Value   CO2 20 (*)    Glucose, Bld 116 (*)    Calcium 8.7 (*)    All other components within normal limits  CBC - Abnormal; Notable for the following:    WBC 12.0 (*)    RDW 16.3 (*)    Platelets 131 (*)    All other components within normal limits  CBG MONITORING, ED   ____________________________________________  EKG  reviewed and interpreted by me at 5:15 AM Heart rate 87 QRS 95 QTC 460 Normal sinus rhythm, no evidence of ischemia or ectopy, repolarization abnormality is notable through precordial leads. EKG appears consistent and unchanged from previous from 11/16/2016 ____________________________________________  RADIOLOGY    patient has a history of established seizure disorder. His last head CT was 10/07/2016, interpreted as normal. Based on a previous history of seizure disorder, documentation of probable noncompliance and not having access to his Keppra, I do not think patient would benefit from a repeat head CT at this time unless further concerns develop while being watched in the ED ____________________________________________   PROCEDURES  Procedure(s) performed: None  Procedures  Critical Care performed: No  CRITICAL CARE Performed by: Delman Kitten   Total critical care time: 35 minutes  Critical care time was exclusive of separately billable procedures and treating other patients.  Critical care was necessary to treat or prevent imminent or life-threatening deterioration.  Critical care was time spent personally by me on the following activities: development of treatment plan with patient and/or surrogate as well as nursing, discussions with consultants, evaluation of patient's  response to treatment, examination of patient, obtaining history from patient or surrogate, ordering and performing treatments and interventions, ordering and review of laboratory studies, ordering and review of radiographic studies, pulse oximetry and re-evaluation of patient's condition.  Patient presentswith a witnessed seizure requiring IV Versedthenpostictal and very agitatedrequiring additional Versed.Present patient's room for approximately 20-25 minutes during initial presentation to the patient's elevated care needs and critical status following a seizure. ____________________________________________   INITIAL IMPRESSION / ASSESSMENT AND PLAN / ED COURSE  Pertinent labs & imaging results that were available during my care of the patient were reviewed by me and considered in my medical decision making (see chart for details).  Multiple seizures.  One witnessed by myself, evidently with return to normal alertness after each episode.because of postictal agitation, he received 2 additional doses of Versed 2 mg, once with EMS, once in the ER. He is calm and appropriately.  ----------------------------------------- 5:15 AM on 01/22/2017 -----------------------------------------  The patient is relaxed, resting comfortably with even, unlabored respirations. 94% RA saturation at  this time.    Clinical Course as of Jan 22 757  Wed Jan 22, 2017  0786 Patient continued to rest comfortably. He shows no further evidence of agitation or seizure-like activity. Does perform purposeful movements, resting at this time.  [MQ]    Clinical Course User Index [MQ] Delman Kitten, MD     ____________________________________________   FINAL CLINICAL IMPRESSION(S) / ED DIAGNOSES  Final diagnoses:  Seizure disorder ([place])      NEW MEDICATIONS STARTED DURING THIS VISIT:  Current Discharge Medication List       Note:  This document was prepared using Dragon voice recognition software and may  include unintentional dictation errors.     Delman Kitten, MD 01/22/17 0758  ----------------------------------------- 7:59 AM on 01/22/2017 -----------------------------------------  Patient resting comfortably, had on the pillow. Awakes to verbal, gives name, knows the hospital, speaks in coherent sentecnes and will continue to allow him to rest but is showing significant improvement in mental status no ongoing seizure activity     Delman Kitten, MD 01/22/17 0800 ongoing care signed Dr. Jimmye Norman. Plan to continue to observe, once patient's status improved likely discharge to home with prescription to refill his Keppra which evidently has run out   Delman Kitten, MD 01/22/17 [id number]

## 2017-01-22 NOTE — Discharge Instructions (Signed)
You have been seen in the emergency department today for a seizure.  Your workup today including labs are within normal limits.  Please follow up with your doctor/neurologist as soon as possible regarding today's emergency department visit and your likely seizure. Do NOT USE COCAINE or illegal drugs, these may lead to having more seizures.  As we have discussed it is very important that you do not drive until you have been seen and cleared by your neurologist.  Please drink plenty of fluids, get plenty of sleep and avoid any alcohol or drug use Please return to the emergency department if you have any further seizures which do not respond to medications, or for any other symptoms per se concerning for yourself.

## 2017-01-22 NOTE — ED Provider Notes (Signed)
patient is currently awake and alert and stating that he missed his Keppra dose last night. Otherwise he is neurologically intact and stable for discharge.   [name], MD 01/22/17 [phone]

## 2017-01-22 NOTE — ED Notes (Signed)
Oxygen sensor placed on pt finger.

## 2017-01-22 NOTE — ED Notes (Signed)
Mallie Mussel RN called pharmacy to request the Keppra ordered for the Pt.

## 2017-03-06 DIAGNOSIS — E559 Vitamin D deficiency, unspecified: Secondary | ICD-10-CM | POA: Diagnosis not present

## 2017-03-06 DIAGNOSIS — G43711 Chronic migraine without aura, intractable, with status migrainosus: Secondary | ICD-10-CM | POA: Diagnosis not present

## 2017-03-06 DIAGNOSIS — E785 Hyperlipidemia, unspecified: Secondary | ICD-10-CM | POA: Diagnosis not present

## 2017-03-06 DIAGNOSIS — G5643 Causalgia of bilateral upper limbs: Secondary | ICD-10-CM | POA: Diagnosis not present

## 2017-03-06 DIAGNOSIS — R569 Unspecified convulsions: Secondary | ICD-10-CM | POA: Diagnosis not present

## 2017-03-06 DIAGNOSIS — Z716 Tobacco abuse counseling: Secondary | ICD-10-CM | POA: Diagnosis not present

## 2017-03-06 DIAGNOSIS — E039 Hypothyroidism, unspecified: Secondary | ICD-10-CM | POA: Diagnosis not present

## 2017-03-06 DIAGNOSIS — F250 Schizoaffective disorder, bipolar type: Secondary | ICD-10-CM | POA: Diagnosis not present

## 2017-03-06 DIAGNOSIS — J449 Chronic obstructive pulmonary disease, unspecified: Secondary | ICD-10-CM | POA: Diagnosis not present

## 2017-03-06 DIAGNOSIS — R1010 Upper abdominal pain, unspecified: Secondary | ICD-10-CM | POA: Diagnosis not present

## 2017-03-06 DIAGNOSIS — R2689 Other abnormalities of gait and mobility: Secondary | ICD-10-CM | POA: Diagnosis not present

## 2017-03-06 DIAGNOSIS — Z1389 Encounter for screening for other disorder: Secondary | ICD-10-CM | POA: Diagnosis not present

## 2017-03-06 DIAGNOSIS — F17200 Nicotine dependence, unspecified, uncomplicated: Secondary | ICD-10-CM | POA: Diagnosis not present

## 2017-03-06 DIAGNOSIS — Z0000 Encounter for general adult medical examination without abnormal findings: Secondary | ICD-10-CM | POA: Diagnosis not present

## 2017-03-26 DIAGNOSIS — R42 Dizziness and giddiness: Secondary | ICD-10-CM | POA: Diagnosis not present

## 2017-03-26 DIAGNOSIS — R569 Unspecified convulsions: Secondary | ICD-10-CM | POA: Diagnosis not present

## 2017-04-04 DIAGNOSIS — R569 Unspecified convulsions: Secondary | ICD-10-CM | POA: Diagnosis not present

## 2017-06-05 ENCOUNTER — Inpatient Hospital Stay
Admission: EM | Admit: 2017-06-05 | Discharge: 2017-06-06 | DRG: 101 | Disposition: A | Discharge status: Home Health | Payer: Medicare Other | Attending: Internal Medicine | Admitting: Internal Medicine

## 2017-06-05 ENCOUNTER — Encounter: Payer: Self-pay | Admitting: Emergency Medicine

## 2017-06-05 ENCOUNTER — Other Ambulatory Visit: Payer: Self-pay

## 2017-06-05 ENCOUNTER — Emergency Department: Payer: Medicare Other

## 2017-06-05 DIAGNOSIS — F209 Schizophrenia, unspecified: Secondary | ICD-10-CM | POA: Diagnosis present

## 2017-06-05 DIAGNOSIS — N289 Disorder of kidney and ureter, unspecified: Secondary | ICD-10-CM | POA: Diagnosis present

## 2017-06-05 DIAGNOSIS — D72829 Elevated white blood cell count, unspecified: Secondary | ICD-10-CM | POA: Diagnosis present

## 2017-06-05 DIAGNOSIS — F319 Bipolar disorder, unspecified: Secondary | ICD-10-CM | POA: Diagnosis present

## 2017-06-05 DIAGNOSIS — G40909 Epilepsy, unspecified, not intractable, without status epilepticus: Secondary | ICD-10-CM | POA: Diagnosis not present

## 2017-06-05 DIAGNOSIS — R569 Unspecified convulsions: Secondary | ICD-10-CM

## 2017-06-05 DIAGNOSIS — F17200 Nicotine dependence, unspecified, uncomplicated: Secondary | ICD-10-CM | POA: Diagnosis present

## 2017-06-05 DIAGNOSIS — G40409 Other generalized epilepsy and epileptic syndromes, not intractable, without status epilepticus: Principal | ICD-10-CM | POA: Diagnosis present

## 2017-06-05 DIAGNOSIS — N179 Acute kidney failure, unspecified: Secondary | ICD-10-CM | POA: Diagnosis not present

## 2017-06-05 DIAGNOSIS — R531 Weakness: Secondary | ICD-10-CM | POA: Diagnosis not present

## 2017-06-05 HISTORY — DX: Unspecified convulsions: R56.9

## 2017-06-05 LAB — COMPREHENSIVE METABOLIC PANEL
ALBUMIN: 3.9 g/dL (ref 3.5–5.0)
ALT: 26 U/L (ref 17–63)
AST: 45 U/L — AB (ref 15–41)
Alkaline Phosphatase: 57 U/L (ref 38–126)
Anion gap: 19 — ABNORMAL HIGH (ref 5–15)
BUN: 16 mg/dL (ref 6–20)
CHLORIDE: 106 mmol/L (ref 101–111)
CO2: 14 mmol/L — AB (ref 22–32)
CREATININE: 1.27 mg/dL — AB (ref 0.61–1.24)
Calcium: 8.9 mg/dL (ref 8.9–10.3)
GFR calc Af Amer: >60 mL/min (ref >60)
GFR calc non Af Amer: >60 mL/min (ref >60)
Glucose, Bld: 135 mg/dL — ABNORMAL HIGH (ref 65–99)
POTASSIUM: 3.7 mmol/L (ref 3.5–5.1)
SODIUM: 139 mmol/L (ref 135–145)
Total Bilirubin: 0.4 mg/dL (ref 0.3–1.2)
Total Protein: 6.7 g/dL (ref 6.5–8.1)

## 2017-06-05 LAB — CBC
HCT: 47.3 % (ref 40.0–52.0)
Hemoglobin: 15 g/dL (ref 13.0–18.0)
MCH: 29.3 pg (ref 26.0–34.0)
MCHC: 31.8 g/dL — ABNORMAL LOW (ref 32.0–36.0)
MCV: 92.1 fL (ref 80.0–100.0)
PLATELETS: 180 10*3/uL (ref 150–440)
RBC: 5.13 MIL/uL (ref 4.40–5.90)
RDW: 16.1 % — AB (ref 11.5–14.5)
WBC: 18.3 10*3/uL — AB (ref 3.8–10.6)

## 2017-06-05 LAB — TROPONIN I

## 2017-06-05 LAB — ETHANOL: Alcohol, Ethyl (B): <10 mg/dL (ref <10)

## 2017-06-05 IMAGING — CT CT HEAD W/O CM
3 of 6 series · 16 of 47 positions shown, 19 images · non-contrast
Comparison: [DATE]

CLINICAL DATA: Two seizures within the last hour, 3 today to for
total

EXAM:
CT HEAD WITHOUT CONTRAST
TECHNIQUE: Contiguous axial images were obtained from the base of the skull
through the vertex without intravenous contrast. Sagittal and
coronal MPR images reconstructed from axial data set. The

[Series 2: head wo · axial · 0.47mm/px · z∈[-102,+43]mm · 10 of 35 slices shown, 13 images]
[im 3/35  brain]
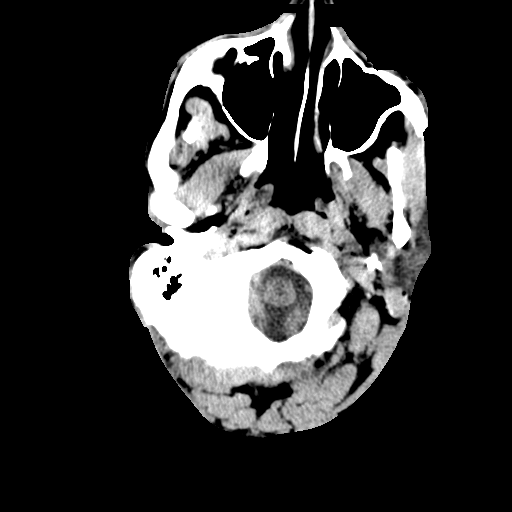
[im 3/35  bone]
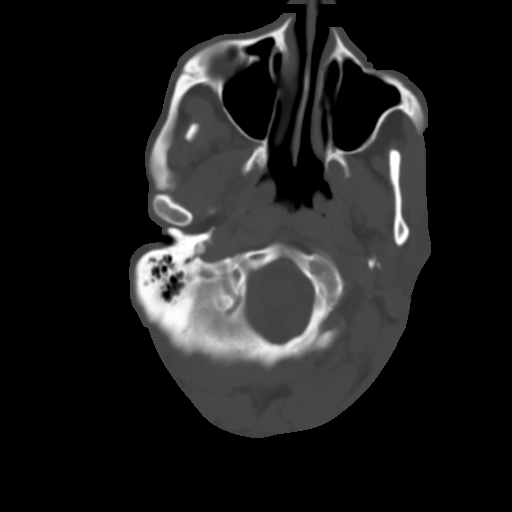
[im 5/35  brain]
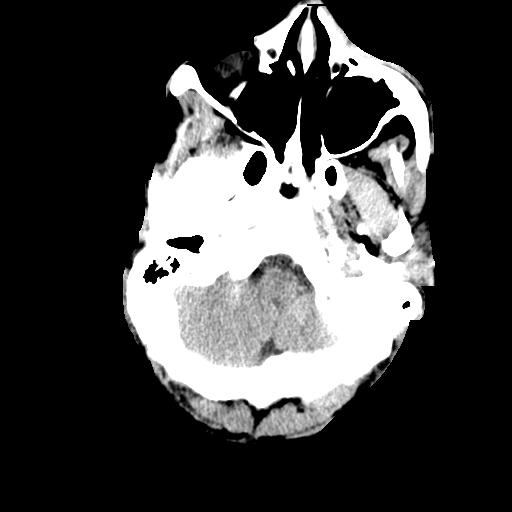
[im 10/35  brain]
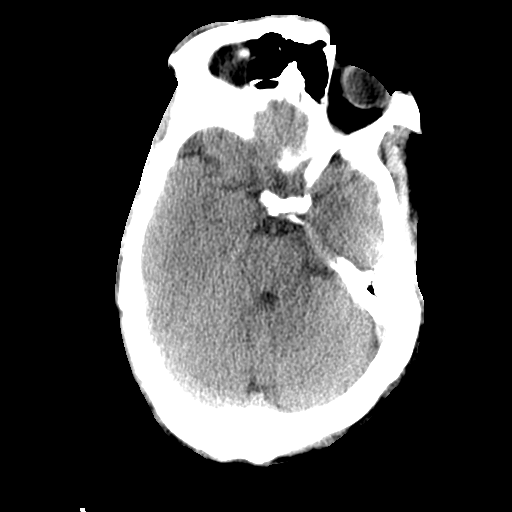
[im 13/35  brain]
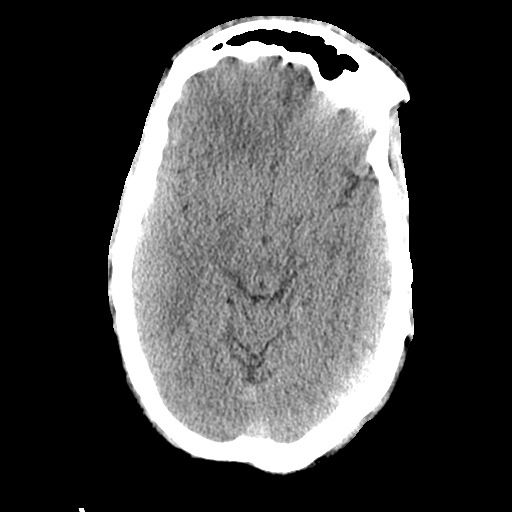
[im 15/35  brain]
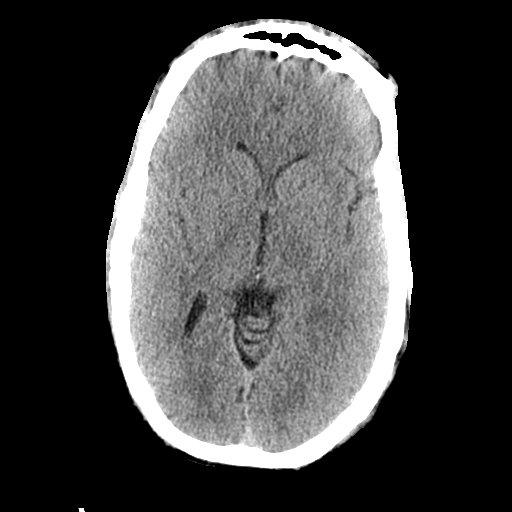
[im 15/35  bone]
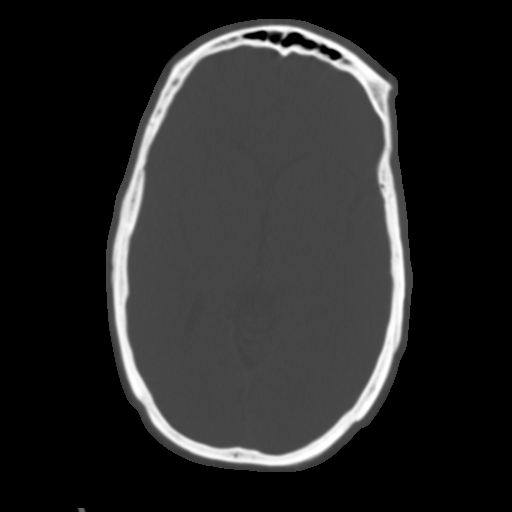
[im 20/35  brain]
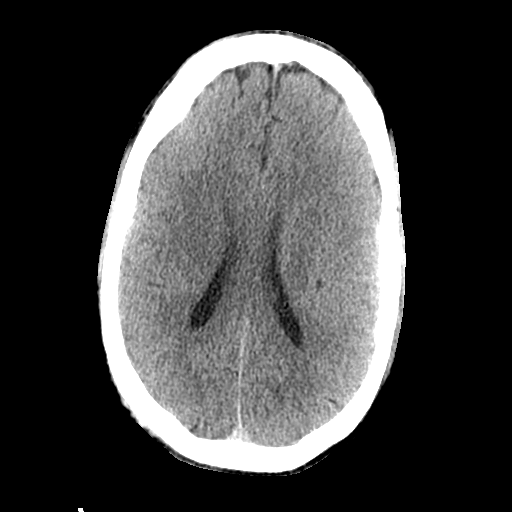
[im 22/35  brain]
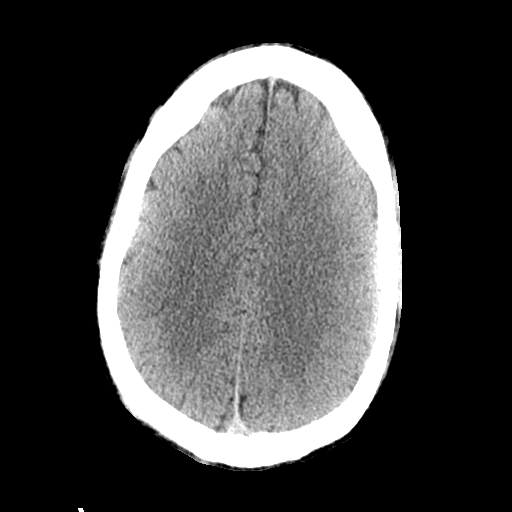
[im 25/35  brain]
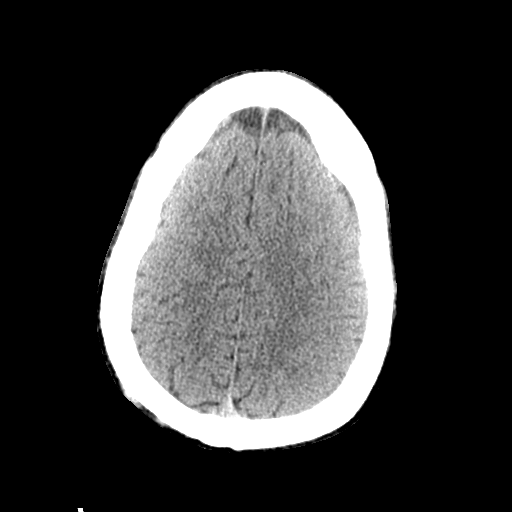
[im 30/35  brain]
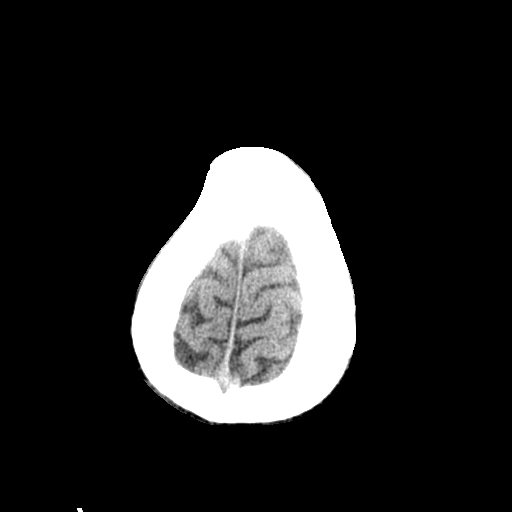
[im 30/35  bone]
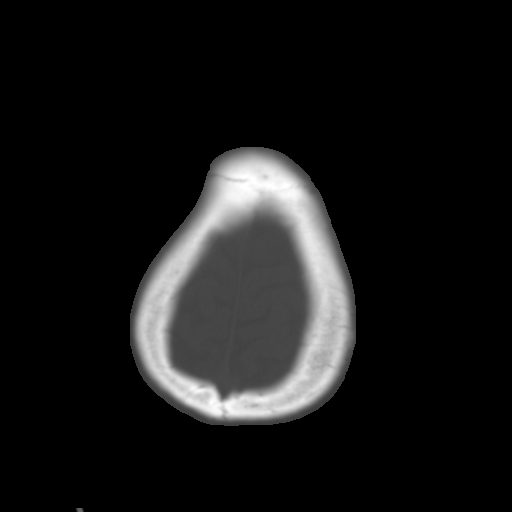
[im 32/35  brain]
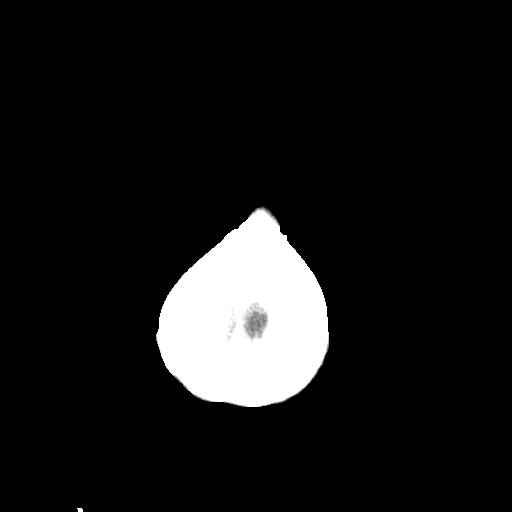

[Series 6: coronal soft tissue · coronal · 0.32mm/px · 3 of 75 slices shown]
[im 19/75  brain]
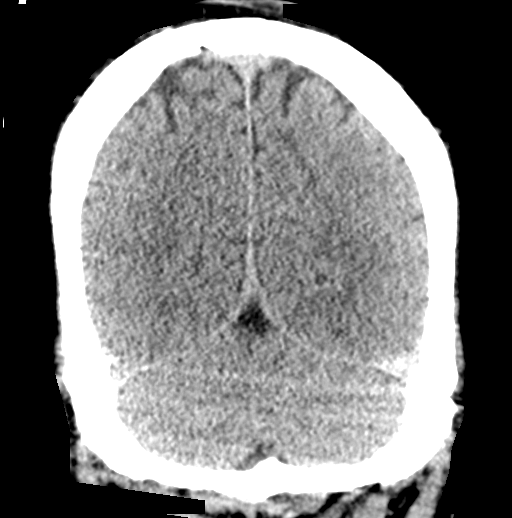
[im 38/75  brain]
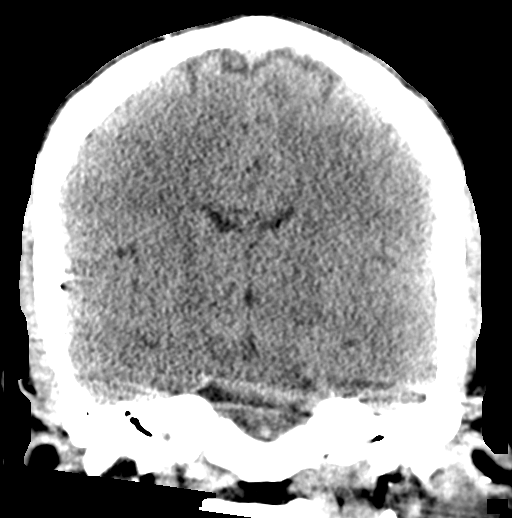
[im 56/75  brain]
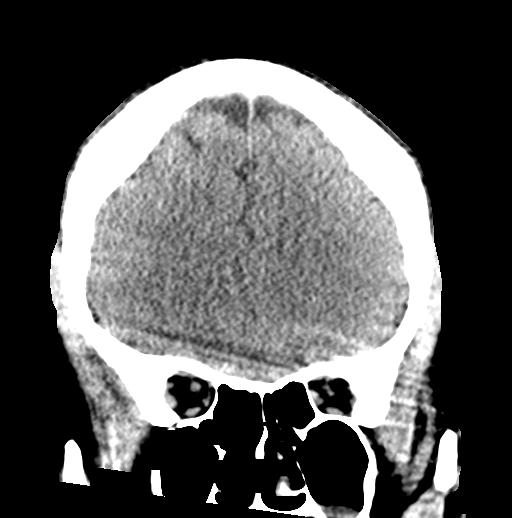

[Series 7: sagittal soft tissue · sagittal · 0.36mm/px · 3 of 56 slices shown]
[im 14/56  brain]
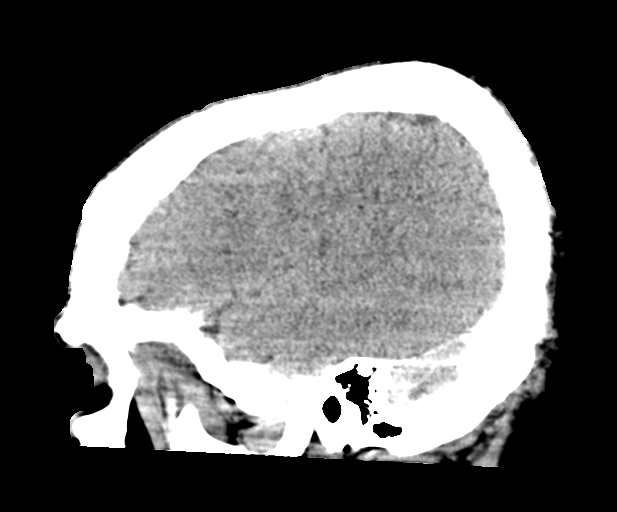
[im 27/56  brain]
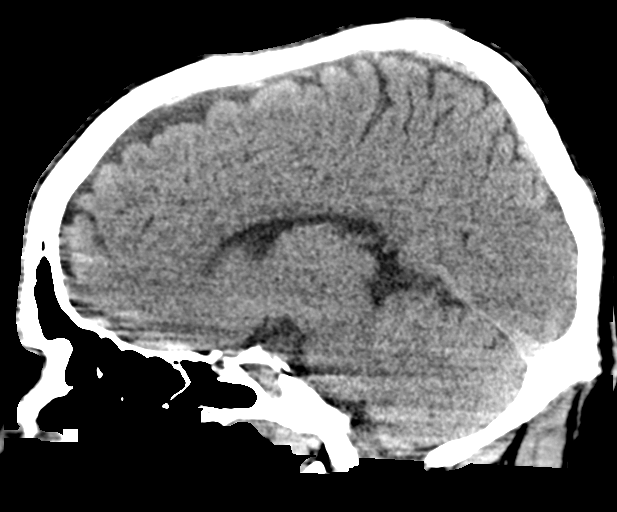
[im 40/56  brain]
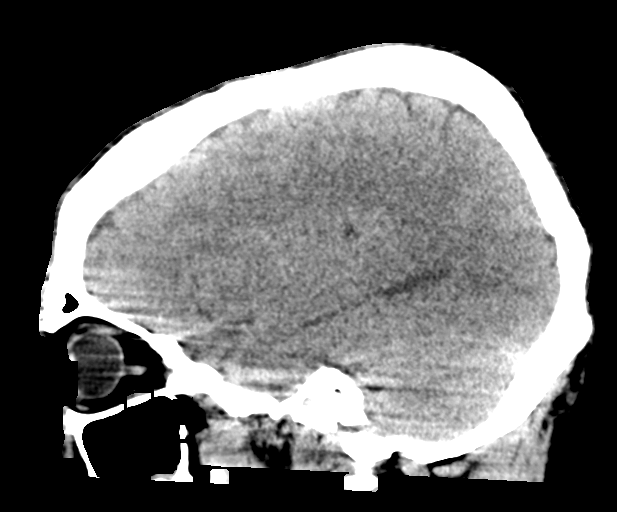

[16 of 47 positions shown; findings below may reference images not displayed]

FINDINGS: Brain: Scattered artifacts. Normal ventricular morphology. No
midline shift or mass effect. Normal appearance of brain parenchyma.
No intracranial hemorrhage, mass lesion or evidence of acute
infarction. No extra-axial fluid collections.

Vascular: No hyperdense vessels.

Skull: Intact

Sinuses/Orbits: Clear

Other: N/A
IMPRESSION: No acute intracranial abnormalities.

## 2017-06-05 MED ORDER — SODIUM CHLORIDE 0.9 % IV BOLUS (SEPSIS)
1000.0000 mL | Freq: Once | INTRAVENOUS | Status: AC
  Administered 2017-06-05: 1000 mL via INTRAVENOUS

## 2017-06-05 MED ORDER — LORAZEPAM 2 MG/ML IJ SOLN
INTRAMUSCULAR | Status: AC
  Administered 2017-06-05: 2 mg via INTRAVENOUS
  Filled 2017-06-05: qty 1

## 2017-06-05 MED ORDER — SODIUM CHLORIDE 0.9 % IV SOLN
INTRAVENOUS | Status: DC
  Administered 2017-06-05 – 2017-06-06 (×2): via INTRAVENOUS

## 2017-06-05 MED ORDER — LORAZEPAM 2 MG/ML IJ SOLN
2.0000 mg | Freq: Once | INTRAMUSCULAR | Status: AC
Start: 2017-06-05 — End: 2017-06-05
  Administered 2017-06-05: 2 mg via INTRAVENOUS

## 2017-06-05 MED ORDER — ACETAMINOPHEN 325 MG PO TABS: 650.0000 mg | ORAL_TABLET | Freq: Four times a day (QID) | ORAL | Status: DC | PRN

## 2017-06-05 MED ORDER — LORAZEPAM 2 MG/ML IJ SOLN: 2.0000 mg | INTRAMUSCULAR | Status: DC | PRN

## 2017-06-05 MED ORDER — SODIUM CHLORIDE 0.9 % IV SOLN
1500.0000 mg | Freq: Once | INTRAVENOUS | Status: AC
  Administered 2017-06-05: 1500 mg via INTRAVENOUS
  Filled 2017-06-05: qty 15

## 2017-06-05 MED ORDER — BISACODYL 5 MG PO TBEC: 5.0000 mg | DELAYED_RELEASE_TABLET | Freq: Every day | ORAL | Status: DC | PRN

## 2017-06-05 MED ORDER — HYDROCODONE-ACETAMINOPHEN 5-325 MG PO TABS: 1.0000 | ORAL_TABLET | ORAL | Status: DC | PRN

## 2017-06-05 MED ORDER — ENOXAPARIN SODIUM 40 MG/0.4ML ~~LOC~~ SOLN
40.0000 mg | SUBCUTANEOUS | Status: DC
  Administered 2017-06-05: 40 mg via SUBCUTANEOUS
  Filled 2017-06-05: qty 0.4

## 2017-06-05 MED ORDER — ONDANSETRON HCL 4 MG/2ML IJ SOLN: 4.0000 mg | Freq: Four times a day (QID) | INTRAMUSCULAR | Status: DC | PRN

## 2017-06-05 MED ORDER — ONDANSETRON HCL 4 MG PO TABS: 4.0000 mg | ORAL_TABLET | Freq: Four times a day (QID) | ORAL | Status: DC | PRN

## 2017-06-05 MED ORDER — SENNOSIDES-DOCUSATE SODIUM 8.6-50 MG PO TABS: 1.0000 | ORAL_TABLET | Freq: Every evening | ORAL | Status: DC | PRN

## 2017-06-05 MED ORDER — SODIUM CHLORIDE 0.9 % IV SOLN
500.0000 mg | Freq: Two times a day (BID) | INTRAVENOUS | Status: DC
  Administered 2017-06-06 (×2): 500 mg via INTRAVENOUS
  Filled 2017-06-05 (×3): qty 5

## 2017-06-05 MED ORDER — ALBUTEROL SULFATE (2.5 MG/3ML) 0.083% IN NEBU
2.5000 mg | INHALATION_SOLUTION | RESPIRATORY_TRACT | Status: DC | PRN
Start: 2017-06-05 — End: 2017-06-06

## 2017-06-05 MED ORDER — ACETAMINOPHEN 650 MG RE SUPP
650.0000 mg | Freq: Four times a day (QID) | RECTAL | Status: DC | PRN
  Administered 2017-06-05: 650 mg via RECTAL
  Filled 2017-06-05: qty 1

## 2017-06-05 NOTE — ED Triage Notes (Signed)
Patient from home via [facility]. Reports having 2 seizures within the last hour. 3 total today. Patient has history of seizures and takes keppra. Per home health, patient is compliant with meds. Patient post-ictal upon arrival, with slurred speech and delayed responses. Patient with dried blood noted around mouth.

## 2017-06-05 NOTE — ED Notes (Signed)
Pt alert and oriented  X 4 at this time. States that he is complaint with PO Keppra. Pt drifts off to sleep when not being stimulated by RN.

## 2017-06-05 NOTE — ED Provider Notes (Signed)
[facility] Emergency Department Provider Note  Time seen: 1:24 PM  I have reviewed the triage vital signs and the nursing notes.   HISTORY  Chief Complaint Seizures    HPI Luis Hunter is a 42 y.o. male with a past medical history of bipolar, depression, schizophrenia, seizure disorder presents to the emergency department for a seizure.  According to EMS report patient reported having 2 seizures within the past 1 hour, had a seizure prior to that as well, 3 seizures in total today.  Per report patient takes Keppra, denied missing any doses.  EMS states patient had seizure-like activity with them upon arrival patient is somewhat postictal.  Has bloody sputum in his mouth.  Nurse states the patient was able to speak after a short weight in the emergency department.  Denied missing any of his medications.  However shortly after the nurse spoke to the patient he had an additional seizure which I witnessed which appears to be consistent with a generalized tonic-clonic seizure, some bloody sputum from the mouth during the seizure with a postictal period.  Patient is not able to provide a history and review of systems at this time.   Past Medical History:  Diagnosis Date  . Bipolar disorder ([place])   . Depression   . Schizophrenia ([place])   . Seizures [facility])     Patient Active Problem List   Diagnosis Date Noted  . BPPV (benign paroxysmal positional vertigo) 11/16/2015  . Dizziness 11/16/2015    Past Surgical History:  Procedure Laterality Date  . FINGER SURGERY      Prior to Admission medications   Medication Sig Start Date End Date Taking? Authorizing Provider  ABILIFY MAINTENA 400 MG SUSR  11/08/15   [provider]  ibuprofen (ADVIL,MOTRIN) 800 MG tablet Take 1 tablet (800 mg total) by mouth every 8 (eight) hours as needed. 01/19/16   Beers, Pierce Crane, PA-C  levETIRAcetam (KEPPRA) 500 MG tablet Take 1 tablet (500 mg total) by mouth 2 (two) times daily.  01/22/17   Delman Kitten, MD  meclizine (ANTIVERT) 25 MG tablet Take 1 tablet (25 mg total) by mouth 3 (three) times daily as needed for dizziness. 11/16/15   Roselee Nova, MD    No Known Allergies  No family history on file.  Social History Social History   Tobacco Use  . Smoking status: Current Every Day Smoker  . Smokeless tobacco: Never Used  Substance Use Topics  . Alcohol use: No    Alcohol/week: 0.0 oz  . Drug use: Yes    Types: Marijuana    Review of Systems Unable to obtain review of systems at this time due to seizure and postictal state  ____________________________________________   PHYSICAL EXAM:  VITAL SIGNS: ED Triage Vitals  Enc Vitals Group     BP 06/05/17 1237 112/76     Pulse Rate 06/05/17 1237 96     Resp 06/05/17 1237 17     Temp 06/05/17 1237 (!) 97.1 F (36.2 C)     Temp src --      SpO2 06/05/17 1237 99 %     Weight 06/05/17 1252 160 lb (72.6 kg)     Height 06/05/17 1252 5\' 11"  (1.803 m)     Head Circumference --      Peak Flow --      Pain Score 06/05/17 1259 Asleep     Pain Loc --      Pain Edu? --  Excl. in [place]? --    Constitutional: Somnolent status post seizure, bloody sputum in the mouth no obvious laceration on exam.  Unable to follow commands or answer questions currently.  Sonorous breathing. Eyes: Normal exam ENT   Head: Normocephalic and atraumatic.   Mouth/Throat: Mucous membranes are moist.  Minimal blood-tinged sputum in the mouth.  Cardiovascular: Normal rate, regular rhythm.  Around 100 bpm Respiratory: Normal respiratory effort without tachypnea nor retractions. Breath sounds are clear.  Mild sonorous breathing. Gastrointestinal: Soft and nontender. No distention.  No reaction to abdominal palpation but the patient is postictal at this time. Musculoskeletal: Nontender with normal range of motion in all extremities.  Neurologic:  Normal speech and language. No gross focal neurologic deficits Skin:  Skin is  warm, dry and intact.  Psychiatric: Mood and affect are normal.   ____________________________________________    RADIOLOGY  CT scan of the head shows no acute abnormality.  ____________________________________________   INITIAL IMPRESSION / ASSESSMENT AND PLAN / ED COURSE  Pertinent labs & imaging results that were available during my care of the patient were reviewed by me and considered in my medical decision making (see chart for details).  Patient presents to the emergency department multiple seizures today had a seizure in the emergency department appear to be a generalized tonic-clonic seizure.  We will dose 2 mg of Ativan, I have ordered 1500 mg load of Keppra for the patient.  Differential this time would include seizures, recurrent seizures, intracranial abnormality, left lateral metabolic abnormality, medication noncompliance, infectious etiology.  We will check labs, obtain a CT scan of the head, continue to closely monitor in the emergency department.  I reviewed the patient's records, he was seen by Dr. Melrose Nakayama 03/26/17 as an initial consult.  Had a normal EEG on 04/04/17.  Occasional mixed frequencies in the righ region.  CT negative.  Labs are largely within normal limits besides a moderate leukocytosis of 18,000.  Patient remains somewhat postictal, will open his eyes to questioning but then rolls away and does not answer.  When you attempt to examine he pushes her hand away from his body.  We will discussed with Dr. Doy Mince for further recommendations.  I anticipate likely admission to the hospital.  Patient has received 2 mg of Ativan in the emergency department as well as 1500 mg of Keppra IV.  Discussed with Dr. Doy Mince recommends admission to the hospital.    ____________________________________________   FINAL CLINICAL IMPRESSION(S) / ED DIAGNOSES  Seizures    Harvest Dark, MD 06/05/17 1540

## 2017-06-05 NOTE — ED Notes (Signed)
Pt to have witnessed seizure in hallway.  EDP and nursing staff at bedside. Lasted approx 1 minute. Pt turned on side and medications given per verbal order.

## 2017-06-05 NOTE — ED Notes (Signed)
Patient transported to CT 

## 2017-06-05 NOTE — Progress Notes (Signed)
Patient admitted to room. Seems to be post-ictal, arousable but drowsy and confused. Had incontinent episode and vomiting while transitioning from stretcher to bed. Trouble focusing and following commands. Bed alarm on. Skin assessment with [facility] RN. Vitals as charted. Seizure precautions initiated.

## 2017-06-05 NOTE — H&P (Signed)
Luis Hunter NAME: Luis Hunter    MR#:  [id number]  DATE OF BIRTH:  [birth year]  DATE OF ADMISSION:  06/05/2017  PRIMARY CARE PHYSICIAN: Roselee Nova, MD   REQUESTING/REFERRING PHYSICIAN: Harvest Dark, MD  CHIEF COMPLAINT:   Chief Complaint  Patient presents with  . Seizures   Seizure activity today. HISTORY OF PRESENT ILLNESS:  Luis Hunter  is a 42 y.o. male with a known history of seizure disorder, depression, schizophrenia and bipolar disorder.  The patient was sent to ED due to seizure today.  Per ED physician, the patient had 2 episodes of seizure in 1 hour and 3 seizures total today. Per report patient takes Keppra, denied missing any doses.  The patient was oblique when the patient was sent to ED but developed additional seizure which is like generalized tonic-clonic seizure.  He was treated with Ativan IV 1 dose and a loading dose of Keppra in the ED.  PAST MEDICAL HISTORY:   Past Medical History:  Diagnosis Date  . Bipolar disorder ([place])   . Depression   . Schizophrenia ([place])   . Seizures ([place])     PAST SURGICAL HISTORY:   Past Surgical History:  Procedure Laterality Date  . FINGER SURGERY      SOCIAL HISTORY:   Social History   Tobacco Use  . Smoking status: Current Every Day Smoker  . Smokeless tobacco: Never Used  Substance Use Topics  . Alcohol use: No    Alcohol/week: 0.0 oz    FAMILY HISTORY:  No family history on file.  DRUG ALLERGIES:  No Known Allergies  REVIEW OF SYSTEMS:   Review of Systems  Unable to perform ROS: Patient unresponsive    MEDICATIONS AT HOME:   Prior to Admission medications   Medication Sig Start Date End Date Taking? Authorizing Provider  ABILIFY MAINTENA 400 MG SUSR  11/08/15   [provider]  ibuprofen (ADVIL,MOTRIN) 800 MG tablet Take 1 tablet (800 mg total) by mouth every 8 (eight) hours as needed. 01/19/16   Beers, Pierce Crane, PA-C    levETIRAcetam (KEPPRA) 500 MG tablet Take 1 tablet (500 mg total) by mouth 2 (two) times daily. 01/22/17   Delman Kitten, MD  meclizine (ANTIVERT) 25 MG tablet Take 1 tablet (25 mg total) by mouth 3 (three) times daily as needed for dizziness. 11/16/15   Roselee Nova, MD      VITAL SIGNS:  Blood pressure 112/66, pulse 91, temperature (!) 97.1 F (36.2 C), resp. rate (!) 23, height 5\' 11"  (1.803 m), weight 160 lb (72.6 kg), SpO2 94 %.  PHYSICAL EXAMINATION:  Physical Exam  GENERAL:  42 y.o.-year-old patient lying in the bed with no acute distress.  EYES: Pupils equal, round, reactive to light. No scleral icterus. Extraocular muscles intact.  HEENT: Head atraumatic, normocephalic.  NECK:  Supple, no jugular venous distention. No thyroid enlargement, no tenderness.  LUNGS: Normal breath sounds bilaterally, no wheezing, rales,rhonchi or crepitation. No use of accessory muscles of respiration.  CARDIOVASCULAR: S1, S2 normal. No murmurs, rubs, or gallops.  ABDOMEN: Soft, nontender, nondistended.  Hypoactive bowel sounds present. No organomegaly or mass.  EXTREMITIES: No pedal edema, cyanosis, or clubbing.  NEUROLOGIC: Unable to exam. PSYCHIATRIC: The patient is responsive. SKIN: No obvious rash, lesion, or ulcer.   LABORATORY PANEL:   CBC Recent Labs  Lab 06/05/17 1243  WBC 18.3*  HGB 15.0  HCT 47.3  PLT 180   ------------------------------------------------------------------------------------------------------------------  Chemistries  Recent Labs  Lab 06/05/17 1243  NA 139  K 3.7  CL 106  CO2 14*  GLUCOSE 135*  BUN 16  CREATININE 1.27*  CALCIUM 8.9  AST 45*  ALT 26  ALKPHOS 57  BILITOT 0.4   ------------------------------------------------------------------------------------------------------------------  Cardiac Enzymes Recent Labs  Lab 06/05/17 1243  TROPONINI <0.03    ------------------------------------------------------------------------------------------------------------------  RADIOLOGY:  Ct Head Wo Contrast  Result Date: 06/05/2017 CLINICAL DATA:  Two seizures within the last hour, 3 today to for total EXAM: CT HEAD WITHOUT CONTRAST TECHNIQUE: Contiguous axial images were obtained from the base of the skull through the vertex without intravenous contrast. Sagittal and coronal MPR images reconstructed from axial data set. The COMPARISON:  10/07/2016 FINDINGS: Brain: Scattered artifacts. Normal ventricular morphology. No midline shift or mass effect. Normal appearance of brain parenchyma. No intracranial hemorrhage, mass lesion or evidence of acute infarction. No extra-axial fluid collections. Vascular: No hyperdense vessels. Skull: Intact Sinuses/Orbits: Clear Other: N/A IMPRESSION: No acute intracranial abnormalities. Electronically Signed   By: Lavonia Dana M.D.   On: 06/05/2017 14:25      IMPRESSION AND PLAN:   Seizure The patient will be admitted to medical floor. The patient was treated with Ativan and loading dose of Keppra 1500 mg IV in the ED. No seizure for now but that the patient is unresponsive but confused. Keep n.p.o. with IV fluid support, continue Keppra 500 mg IV in the follow-up level. Ativan IV as needed. Seizure precaution, fall and aspiration precaution.  Neurology consult.  Acute renal insufficiency.  IV fluids support and follow-up BMP.  Leukocytosis.  Possible due to reaction.  Follow-up urinalysis and CBC.  All the records are reviewed and case discussed with ED provider. Management plans discussed with the patient, family and they are in agreement.  CODE STATUS: Full code  TOTAL TIME TAKING CARE OF THIS PATIENT: 48 minutes.    Demetrios Loll M.D on 06/05/2017 at 3:24 PM  Between 7am to 6pm - Pager - [phone]  After 6pm go to [email or web address] - [employer] [facility]  Office   [phone]  CC: Primary care physician; Roselee Nova, MD   Note: This dictation was prepared with Dragon dictation along with smaller phrase technology. Any transcriptional errors that result from this process are unin

## 2017-06-06 LAB — URINALYSIS, ROUTINE W REFLEX MICROSCOPIC
BILIRUBIN URINE: NEGATIVE
Glucose, UA: NEGATIVE mg/dL
HGB URINE DIPSTICK: NEGATIVE
KETONES UR: NEGATIVE mg/dL
Leukocytes, UA: NEGATIVE
Nitrite: NEGATIVE
PROTEIN: NEGATIVE mg/dL
Specific Gravity, Urine: 1.013 (ref 1.005–1.030)
pH: 5 (ref 5.0–8.0)

## 2017-06-06 LAB — BASIC METABOLIC PANEL
ANION GAP: 10 (ref 5–15)
BUN: 13 mg/dL (ref 6–20)
CO2: 20 mmol/L — ABNORMAL LOW (ref 22–32)
Calcium: 8.5 mg/dL — ABNORMAL LOW (ref 8.9–10.3)
Chloride: 112 mmol/L — ABNORMAL HIGH (ref 101–111)
Creatinine, Ser: 1.03 mg/dL (ref 0.61–1.24)
GFR calc Af Amer: >60 mL/min (ref >60)
Glucose, Bld: 86 mg/dL (ref 65–99)
POTASSIUM: 3.8 mmol/L (ref 3.5–5.1)
SODIUM: 142 mmol/L (ref 135–145)

## 2017-06-06 LAB — CBC
HEMATOCRIT: 41.5 % (ref 40.0–52.0)
Hemoglobin: 13.8 g/dL (ref 13.0–18.0)
MCH: 29.7 pg (ref 26.0–34.0)
MCHC: 33.1 g/dL (ref 32.0–36.0)
MCV: 89.6 fL (ref 80.0–100.0)
PLATELETS: 155 10*3/uL (ref 150–440)
RBC: 4.64 MIL/uL (ref 4.40–5.90)
RDW: 15.4 % — AB (ref 11.5–14.5)
WBC: 16.8 10*3/uL — AB (ref 3.8–10.6)

## 2017-06-06 LAB — URINE DRUG SCREEN, QUALITATIVE (ARMC ONLY)
Amphetamines, Ur Screen: NOT DETECTED
BARBITURATES, UR SCREEN: NOT DETECTED
Benzodiazepine, Ur Scrn: POSITIVE — AB
CANNABINOID 50 NG, UR ~~LOC~~: POSITIVE — AB
Cocaine Metabolite,Ur ~~LOC~~: NOT DETECTED
MDMA (ECSTASY) UR SCREEN: NOT DETECTED
Methadone Scn, Ur: NOT DETECTED
Opiate, Ur Screen: NOT DETECTED
PHENCYCLIDINE (PCP) UR S: NOT DETECTED
TRICYCLIC, UR SCREEN: NOT DETECTED

## 2017-06-06 MED ORDER — INFLUENZA VAC SPLIT QUAD 0.5 ML IM SUSY: 0.5000 mL | PREFILLED_SYRINGE | INTRAMUSCULAR | Status: DC

## 2017-06-06 MED ORDER — PNEUMOCOCCAL VAC POLYVALENT 25 MCG/0.5ML IJ INJ: 0.5000 mL | INJECTION | INTRAMUSCULAR | Status: DC

## 2017-06-06 MED ORDER — SENNOSIDES-DOCUSATE SODIUM 8.6-50 MG PO TABS: 1.0000 | ORAL_TABLET | Freq: Every evening | ORAL | Status: DC | PRN

## 2017-06-06 MED ORDER — LEVETIRACETAM 750 MG PO TABS: 750.0000 mg | ORAL_TABLET | Freq: Two times a day (BID) | ORAL | 0 refills | Status: DC

## 2017-06-06 MED ORDER — LEVETIRACETAM 750 MG PO TABS
750.0000 mg | ORAL_TABLET | Freq: Two times a day (BID) | ORAL | Status: DC
  Filled 2017-06-06 (×2): qty 1

## 2017-06-06 NOTE — Discharge Instructions (Signed)
°  Follow-up with primary care physician in 5-7 days Follow-up with neurology in 1-2 weeks Instructed not to drive until seen and cleared by neurology

## 2017-06-06 NOTE — Discharge Summary (Signed)
[facility] [place] NAME: Luis Hunter    MR#:  [id number]  DATE OF BIRTH:  [birth year]  DATE OF ADMISSION:  06/05/2017 ADMITTING PHYSICIAN: Demetrios Loll, MD  DATE OF DISCHARGE: 06/06/17  PRIMARY CARE PHYSICIAN: Roselee Nova, MD    ADMISSION DIAGNOSIS:  Seizure ([place]) [R56.9]  DISCHARGE DIAGNOSIS:  Active Problems:   Seizure ([place])   SECONDARY DIAGNOSIS:   Past Medical History:  Diagnosis Date  . Bipolar disorder ([place])   . Depression   . Schizophrenia ([place])   . Seizures [facility])     HOSPITAL COURSE:   HPI  [name]  is a 42 y.o. male with a known history of seizure disorder, depression, schizophrenia and bipolar disorder.  The patient was sent to ED due to seizure today.  Per ED physician, the patient had 2 episodes of seizure in 1 hour and 3 seizures total today. Per report patient takes Keppra, denied missing any doses.  The patient was oblique when the patient was sent to ED but developed additional seizure which is like generalized tonic-clonic seizure.  He was treated with Ativan IV 1 dose and a loading dose of Keppra in the ED.  # Seizure The patient was treated with Ativan and loading dose of Keppra 1500 mg IV in the ED. No seizure after admission Discussed with neurology Dr. Doy Mince recommending Keppra 750 mg p.o. twice daily and outpatient follow-up PatientS mental status is back to his baseline Seizure precaution, fall and aspiration precaution.   Outpatient neurology  follow-up Instructed patient not to drive until cleared by neurologist during follow-up visit  Acute renal insufficiency.  Better with IV fluids I Creatinine at 1.03  Leukocytosis.  Reactive, WBC trending down; UA is negative  Weakness physical therapy is recommending home health PT  DISCHARGE CONDITIONS:   FAIR  CONSULTS OBTAINED:     PROCEDURES NONE  DRUG ALLERGIES:  No Known Allergies  DISCHARGE MEDICATIONS:   Allergies as of  06/06/2017   No Known Allergies     Medication List    STOP taking these medications   ibuprofen 800 MG tablet Commonly known as:  ADVIL,MOTRIN     TAKE these medications   ABILIFY MAINTENA 400 MG Srer Generic drug:  ARIPiprazole ER Inject 400 mg as directed every 30 (thirty) days.   levETIRAcetam 750 MG tablet Commonly known as:  KEPPRA Take 1 tablet (750 mg total) by mouth 2 (two) times daily. What changed:    medication strength  how much to take   meclizine 25 MG tablet Commonly known as:  ANTIVERT Take 1 tablet (25 mg total) by mouth 3 (three) times daily as needed for dizziness.   senna-docusate 8.6-50 MG tablet Commonly known as:  Senokot-S Take 1 tablet by mouth at bedtime as needed for mild constipation.   Vitamin D (Ergocalciferol) 50000 units Caps capsule Commonly known as:  DRISDOL Take 50,000 Units by mouth every 7 (seven) days.        DISCHARGE INSTRUCTIONS:   Follow-up with primary care physician in 5-7 days Follow-up with neurology in 1-2 weeks Instructed not to drive until seen and cleared by neurology  DIET:  Regular diet  DISCHARGE CONDITION:  Stable  ACTIVITY:  Activity as tolerated  OXYGEN:  Home Oxygen: No.   Oxygen Delivery: room air  DISCHARGE LOCATION:  home   If you experience worsening of your admission symptoms, develop shortness of breath, life threatening emergency, suicidal or homicidal thoughts  you must seek medical attention immediately by calling 911 or calling your MD immediately  if symptoms less severe.  You Must read complete instructions/literature along with all the possible adverse reactions/side effects for all the Medicines you take and that have been prescribed to you. Take any new Medicines after you have completely understood and accpet all the possible adverse reactions/side effects.   Please note  You were cared for by a hospitalist during your hospital stay. If you have any questions about your  discharge medications or the care you received while you were in the hospital after you are discharged, you can call the unit and asked to speak with the hospitalist on call if the hospitalist that took care of you is not available. Once you are discharged, your primary care physician will handle any further medical issues. Please note that NO REFILLS for any discharge medications will be authorized once you are discharged, as it is imperative that you return to your primary care physician (or establish a relationship with a primary care physician if you do not have one) for your aftercare needs so that they can reassess your need for medications and monitor your lab values.     Today  Chief Complaint  Patient presents with  . Seizures   Patient is doing fine.  Answering questions appropriately.  Mentating fine.  No other episodes of seizure.  ROS:  CONSTITUTIONAL: Denies fevers, chills. Denies any fatigue, weakness.  EYES: Denies blurry vision, double vision, eye pain. EARS, NOSE, THROAT: Denies tinnitus, ear pain, hearing loss. RESPIRATORY: Denies cough, wheeze, shortness of breath.  CARDIOVASCULAR: Denies chest pain, palpitations, edema.  GASTROINTESTINAL: Denies nausea, vomiting, diarrhea, abdominal pain. Denies bright red blood per rectum. GENITOURINARY: Denies dysuria, hematuria. ENDOCRINE: Denies nocturia or thyroid problems. HEMATOLOGIC AND LYMPHATIC: Denies easy bruising or bleeding. SKIN: Denies rash or lesion. MUSCULOSKELETAL: Denies pain in neck, back, shoulder, knees, hips or arthritic symptoms.  NEUROLOGIC: Denies paralysis, paresthesias.  PSYCHIATRIC: Denies anxiety or depressive symptoms.   VITAL SIGNS:  Blood pressure 115/61, pulse 80, temperature 98.6 F (37 C), temperature source Oral, resp. rate 18, height 5\' 11"  (1.803 m), weight 72.6 kg (160 lb), SpO2 96 %.  I/O:    Intake/Output Summary (Last 24 hours) at 06/06/2017 1510 Last data filed at 06/06/2017 6578 Gross  per 24 hour  Intake 2165 ml  Output 500 ml  Net 1665 ml    PHYSICAL EXAMINATION:  GENERAL:  42 y.o.-year-old patient lying in the bed with no acute distress.  EYES: Pupils equal, round, reactive to light and accommodation. No scleral icterus. Extraocular muscles intact.  HEENT: Head atraumatic, normocephalic. Oropharynx and nasopharynx clear.  NECK:  Supple, no jugular venous distention. No thyroid enlargement, no tenderness.  LUNGS: Normal breath sounds bilaterally, no wheezing, rales,rhonchi or crepitation. No use of accessory muscles of respiration.  CARDIOVASCULAR: S1, S2 normal. No murmurs, rubs, or gallops.  ABDOMEN: Soft, non-tender, non-distended. Bowel sounds present. No organomegaly or mass.  EXTREMITIES: No pedal edema, cyanosis, or clubbing.  NEUROLOGIC: Cranial nerves II through XII are intact. Muscle strength 5/5 in all extremities. Sensation intact. Gait not checked.  PSYCHIATRIC: The patient is alert and oriented x 3.  SKIN: No obvious rash, lesion, or ulcer.   DATA REVIEW:   CBC Recent Labs  Lab 06/06/17 0510  WBC 16.8*  HGB 13.8  HCT 41.5  PLT 155    Chemistries  Recent Labs  Lab 06/05/17 1243 06/06/17 0510  NA 139 142  K 3.7  3.8  CL 106 112*  CO2 14* 20*  GLUCOSE 135* 86  BUN 16 13  CREATININE 1.27* 1.03  CALCIUM 8.9 8.5*  AST 45*  --   ALT 26  --   ALKPHOS 57  --   BILITOT 0.4  --     Cardiac Enzymes Recent Labs  Lab 06/05/17 1243  TROPONINI <0.03    Microbiology Results  No results found for this or any previous visit.  RADIOLOGY:  Ct Head Wo Contrast  Result Date: 06/05/2017 CLINICAL DATA:  Two seizures within the last hour, 3 today to for total EXAM: CT HEAD WITHOUT CONTRAST TECHNIQUE: Contiguous axial images were obtained from the base of the skull through the vertex without intravenous contrast. Sagittal and coronal MPR images reconstructed from axial data set. The COMPARISON:  10/07/2016 FINDINGS: Brain: Scattered artifacts.  Normal ventricular morphology. No midline shift or mass effect. Normal appearance of brain parenchyma. No intracranial hemorrhage, mass lesion or evidence of acute infarction. No extra-axial fluid collections. Vascular: No hyperdense vessels. Skull: Intact Sinuses/Orbits: Clear Other: N/A IMPRESSION: No acute intracranial abnormalities. Electronically Signed   By: Lavonia Dana M.D.   On: 06/05/2017 14:25    EKG:   Orders placed or performed during the hospital encounter of 01/22/17  . EKG 12-Lead  . EKG 12-Lead      Management plans discussed with the patient, family and they are in agreement.  CODE STATUS:     Code Status Orders  (From admission, onward)        Start     Ordered   06/05/17 1858  Full code  Continuous     06/05/17 1857    Code Status History    Date Active Date Inactive Code Status Order ID Comments User Context   This patient has a current code status but no historical code status.      TOTAL TIME TAKING CARE OF THIS PATIENT: 45  minutes.   Note: This dictation was prepared with Dragon dictation along with smaller phrase technology. Any transcriptional errors that result from this process are unintentional.   @[facility] @  on 06/06/2017 at 3:10 PM  Between 7am to 6pm - Pager - [phone]  After 6pm go to [email or web address] - password [email or web address] [email or web address]  [facility]  Office  [phone]  CC: Primary care physician; Roselee Nova, MD

## 2017-06-06 NOTE — Care Management (Signed)
Spoke with patient's friendAnthonette Legato [phone].  He takes patient to physician visits and has just gotten him established at [facility] in [place].  Patient does not follow with Dr Manuella Ghazi. Claiborne Billings says that patient needs personal care services to "help him at home." Michela Pitcher he was denied medicaid pcs by [place] but can not tell CM the reason patient was declined.  He made reference that he did not want to have to speak before the judge.  CM discussed a judge usually does not have a roll in the determination process. Claiborne Billings would like to reapply.  Provided form.  Referral to [facility] for PT and SW.  Social work will need to assist with community resources, medicaid pcs referral.  patient lives with his girlfriend who is wheelchair bound and has in home caregivers.  Claiborne Billings should be contact for home health visits.

## 2017-06-06 NOTE — Clinical Social Work Note (Signed)
CSW received consult that patient needs home health, case management can assist with this.  Case manager is aware, CSW to sign off.  Jones Broom. [place], MSW, [place]  06/06/2017 12:54 PM

## 2017-06-06 NOTE — Evaluation (Signed)
Physical Therapy Evaluation Patient Details Name: Luis Hunter MRN: [id number] DOB: [birth year] Today's Date: 06/06/2017   History of Present Illness  42 y/o with distant history of head injury.  Here after multiple seizures with fall.   Clinical Impression  Pt was able to do mobility acts and transfers safely and w/o issue, but during ambulation he had multiple stagger steps and one LOB.  He reports that this is somewhat typical but that he does not feel as steady as normal.  Pt would benefit from HHPT to address balance issues and improve safety.      Follow Up Recommendations Home health PT    Equipment Recommendations  None recommended by PT    Recommendations for Other Services       Precautions / Restrictions Precautions Precautions: Fall Restrictions Weight Bearing Restrictions: No      Mobility  Bed Mobility Overal bed mobility: Independent                Transfers Overall transfer level: Independent                  Ambulation/Gait Ambulation/Gait assistance: Min guard;Min assist Ambulation Distance (Feet): 200 Feet Assistive device: None       General Gait Details: Pt was able to maintain consistent speed and showed good confidence.  However he had multiple episodes of stagger stepping and one LOB needing assist to stay upright. He reports that this is not far from his baseline and he has had some falls.    Stairs Stairs: Yes Stairs assistance: Supervision Stair Management: One rail Right Number of Stairs: 4    Wheelchair Mobility    Modified Rankin (Stroke Patients Only)       Balance Overall balance assessment: Needs assistance Sitting-balance support: No upper extremity supported Sitting balance-Leahy Scale: Normal     Standing balance support: No upper extremity supported Standing balance-Leahy Scale: Fair Standing balance comment: statically pt with good balance, decreases with dynamic/ambulation tasks                             Pertinent Vitals/Pain Pain Assessment: (chronic back pain that is worse 2/2 laying in bed)    Home Living Family/patient expects to be discharged to:: Private residence Living Arrangements: Spouse/significant other     Home Access: Stairs to enter Entrance Stairs-Rails: Can reach both Entrance Stairs-Number of Steps: 3          Prior Function Level of Independence: Independent         Comments: Pt reports that he is out of the house almost every day     Hand Dominance        Extremity/Trunk Assessment   Upper Extremity Assessment Upper Extremity Assessment: Overall WFL for tasks assessed    Lower Extremity Assessment Lower Extremity Assessment: Overall WFL for tasks assessed       Communication   Communication: No difficulties  Cognition Arousal/Alertness: Awake/alert Behavior During Therapy: WFL for tasks assessed/performed Overall Cognitive Status: Within Functional Limits for tasks assessed                                        General Comments      Exercises     Assessment/Plan    PT Assessment Patient needs continued PT services  PT Problem List Decreased strength;Decreased coordination;Decreased mobility;Decreased balance;Decreased safety  awareness       PT Treatment Interventions Gait training;Therapeutic exercise;Balance training;Neuromuscular re-education;Patient/family education;Functional mobility training    PT Goals (Current goals can be found in the Care Plan section)  Acute Rehab PT Goals Patient Stated Goal: go home today PT Goal Formulation: With patient Time For Goal Achievement: 06/20/17 Potential to Achieve Goals: Good    Frequency Min 2X/week   Barriers to discharge        Co-evaluation               AM-PAC PT "6 Clicks" Daily Activity  Outcome Measure Difficulty turning over in bed (including adjusting bedclothes, sheets and blankets)?: None Difficulty moving from lying on  back to sitting on the side of the bed? : None Difficulty sitting down on and standing up from a chair with arms (e.g., wheelchair, bedside commode, etc,.)?: None Help needed moving to and from a bed to chair (including a wheelchair)?: None Help needed walking in hospital room?: A Little Help needed climbing 3-5 steps with a railing? : A Little 6 Click Score: 22    End of Session Equipment Utilized During Treatment: Gait belt Activity Tolerance: Patient tolerated treatment well Patient left: with chair alarm set;with call bell/phone within reach   PT Visit Diagnosis: Unsteadiness on feet (R26.81)    Time: 1165-7903 PT Time Calculation (min) (ACUTE ONLY): 19 min   Charges:   PT Evaluation $PT Eval Low Complexity: 1 Low     PT G CodesKreg Shropshire, DPT 06/06/2017, 2:09 PM

## 2017-06-06 NOTE — Care Management Note (Signed)
Case Management Note  Patient Details  Name: DIARRA KOS MRN: [id number] Date of Birth: [birth year]  Subjective/Objective:                 Patient admitted from home with seizure.  It is documented in the medical record that patient has a legal guardian but no name or contact information. Patient says "I am my own guardian."  Lives with his girlfriend and says his caregiver is Anthonette Legato- who is not from any agency.  she is a friend.  Says a friend will transport home at discharge.  [place].  Dr Lorin Glass is listed as PCP.  Patient says "I don't do doctors - not since they killed my mother- but they Gave CM to contact his father. Left voicemail  Action/Plan:   Expected Discharge Date:                  Expected Discharge Plan:     In-House Referral:     Discharge planning Services     Post Acute Care Choice:    Choice offered to:     DME Arranged:    DME Agency:     HH Arranged:    HH Agency:     Status of Service:     If discussed at [employer] of Stay Meetings, dates discussed:    Additional Comments:  Katrina Stack, RN 06/06/2017, 10:02 AM

## 2017-06-07 LAB — HIV ANTIBODY (ROUTINE TESTING W REFLEX): HIV Screen 4th Generation wRfx: NONREACTIVE

## 2017-06-09 LAB — LEVETIRACETAM LEVEL: Levetiracetam Lvl: 18 ug/mL (ref 10.0–40.0)

## 2017-06-17 ENCOUNTER — Inpatient Hospital Stay: Payer: Medicare Other | Admitting: Family Medicine

## 2017-06-19 DIAGNOSIS — E559 Vitamin D deficiency, unspecified: Secondary | ICD-10-CM | POA: Diagnosis not present

## 2017-06-19 DIAGNOSIS — E785 Hyperlipidemia, unspecified: Secondary | ICD-10-CM | POA: Diagnosis not present

## 2017-06-19 DIAGNOSIS — R296 Repeated falls: Secondary | ICD-10-CM | POA: Diagnosis not present

## 2017-06-19 DIAGNOSIS — G5643 Causalgia of bilateral upper limbs: Secondary | ICD-10-CM | POA: Diagnosis not present

## 2017-06-19 DIAGNOSIS — F17210 Nicotine dependence, cigarettes, uncomplicated: Secondary | ICD-10-CM | POA: Diagnosis not present

## 2017-06-19 DIAGNOSIS — F17200 Nicotine dependence, unspecified, uncomplicated: Secondary | ICD-10-CM | POA: Diagnosis not present

## 2017-06-19 DIAGNOSIS — G43709 Chronic migraine without aura, not intractable, without status migrainosus: Secondary | ICD-10-CM | POA: Diagnosis not present

## 2017-06-19 DIAGNOSIS — R5383 Other fatigue: Secondary | ICD-10-CM | POA: Diagnosis not present

## 2017-08-20 ENCOUNTER — Emergency Department
Admission: EM | Admit: 2017-08-20 | Discharge: 2017-08-20 | Disposition: A | Discharge status: Home / Self Care | Payer: Medicare Other | Attending: Emergency Medicine | Admitting: Emergency Medicine

## 2017-08-20 ENCOUNTER — Emergency Department: Payer: Medicare Other

## 2017-08-20 ENCOUNTER — Other Ambulatory Visit: Payer: Self-pay

## 2017-08-20 ENCOUNTER — Encounter: Payer: Self-pay | Admitting: Emergency Medicine

## 2017-08-20 DIAGNOSIS — S0081XA Abrasion of other part of head, initial encounter: Secondary | ICD-10-CM | POA: Insufficient documentation

## 2017-08-20 DIAGNOSIS — Y9389 Activity, other specified: Secondary | ICD-10-CM | POA: Diagnosis not present

## 2017-08-20 DIAGNOSIS — F17200 Nicotine dependence, unspecified, uncomplicated: Secondary | ICD-10-CM | POA: Diagnosis not present

## 2017-08-20 DIAGNOSIS — Y92009 Unspecified place in unspecified non-institutional (private) residence as the place of occurrence of the external cause: Secondary | ICD-10-CM | POA: Insufficient documentation

## 2017-08-20 DIAGNOSIS — R569 Unspecified convulsions: Secondary | ICD-10-CM | POA: Diagnosis not present

## 2017-08-20 DIAGNOSIS — G40909 Epilepsy, unspecified, not intractable, without status epilepticus: Secondary | ICD-10-CM | POA: Diagnosis not present

## 2017-08-20 DIAGNOSIS — T148XXA Other injury of unspecified body region, initial encounter: Secondary | ICD-10-CM

## 2017-08-20 DIAGNOSIS — W228XXA Striking against or struck by other objects, initial encounter: Secondary | ICD-10-CM | POA: Diagnosis not present

## 2017-08-20 DIAGNOSIS — Z79899 Other long term (current) drug therapy: Secondary | ICD-10-CM | POA: Diagnosis not present

## 2017-08-20 DIAGNOSIS — Y999 Unspecified external cause status: Secondary | ICD-10-CM | POA: Diagnosis not present

## 2017-08-20 DIAGNOSIS — S0990XA Unspecified injury of head, initial encounter: Secondary | ICD-10-CM | POA: Diagnosis present

## 2017-08-20 LAB — CBC WITH DIFFERENTIAL/PLATELET
BASOS ABS: 0 10*3/uL (ref 0–0.1)
BASOS PCT: 0 %
EOS ABS: 0 10*3/uL (ref 0–0.7)
Eosinophils Relative: 0 %
HCT: 45.1 % (ref 40.0–52.0)
Hemoglobin: 15 g/dL (ref 13.0–18.0)
Lymphocytes Relative: 4 %
Lymphs Abs: 0.6 10*3/uL — ABNORMAL LOW (ref 1.0–3.6)
MCH: 30.1 pg (ref 26.0–34.0)
MCHC: 33.2 g/dL (ref 32.0–36.0)
MCV: 90.7 fL (ref 80.0–100.0)
MONOS PCT: 7 %
Monocytes Absolute: 1 10*3/uL (ref 0.2–1.0)
NEUTROS PCT: 89 %
Neutro Abs: 13.2 10*3/uL — ABNORMAL HIGH (ref 1.4–6.5)
Platelets: 169 10*3/uL (ref 150–440)
RBC: 4.97 MIL/uL (ref 4.40–5.90)
RDW: 16.2 % — ABNORMAL HIGH (ref 11.5–14.5)
WBC: 14.8 10*3/uL — AB (ref 3.8–10.6)

## 2017-08-20 LAB — BASIC METABOLIC PANEL
ANION GAP: 11 (ref 5–15)
BUN: 11 mg/dL (ref 6–20)
CALCIUM: 8.9 mg/dL (ref 8.9–10.3)
CHLORIDE: 107 mmol/L (ref 101–111)
CO2: 22 mmol/L (ref 22–32)
CREATININE: 1.19 mg/dL (ref 0.61–1.24)
GFR calc non Af Amer: >60 mL/min (ref >60)
Glucose, Bld: 102 mg/dL — ABNORMAL HIGH (ref 65–99)
Potassium: 4.1 mmol/L (ref 3.5–5.1)
SODIUM: 140 mmol/L (ref 135–145)

## 2017-08-20 LAB — ETHANOL

## 2017-08-20 IMAGING — CT CT HEAD W/O CM
2 of 4 series · 12 of 47 positions shown, 15 images · non-contrast
Comparison: Head CT [DATE] and earlier.

CLINICAL DATA: 42-year-old male with seizures. Known seizure
disorder.

EXAM:
CT HEAD WITHOUT CONTRAST
TECHNIQUE: Contiguous axial images were obtained from the base of the skull
through the vertex without intravenous contrast.

[Series 4: ax head wo recon · axial · 0.35mm/px · z∈[-188,-3]mm · 9 of 44 slices shown, 12 images]
[im 3/44  brain]
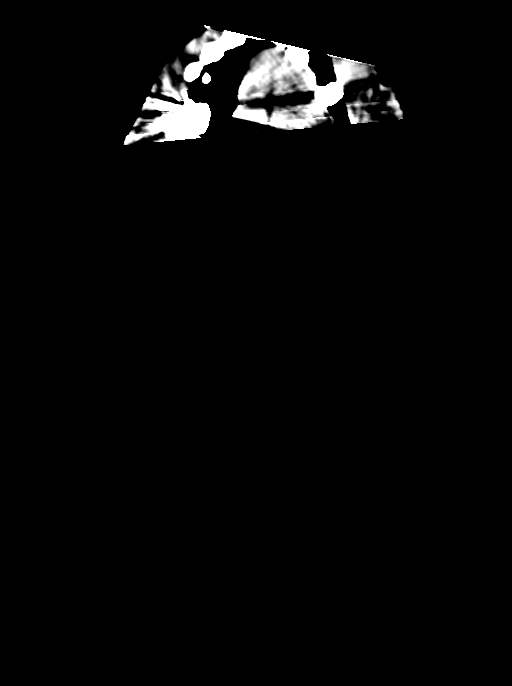
[im 3/44  bone]
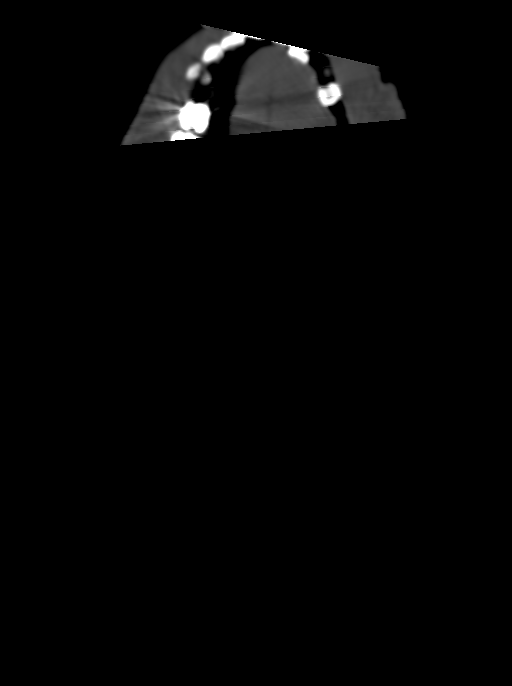
[im 9/44  brain]
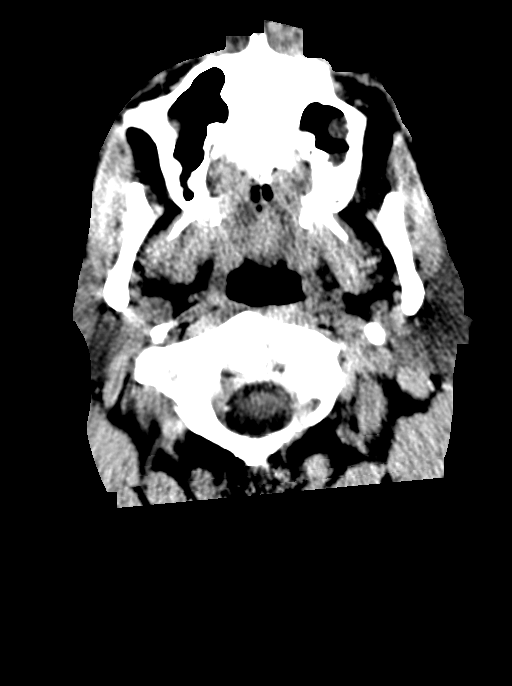
[im 12/44  brain]
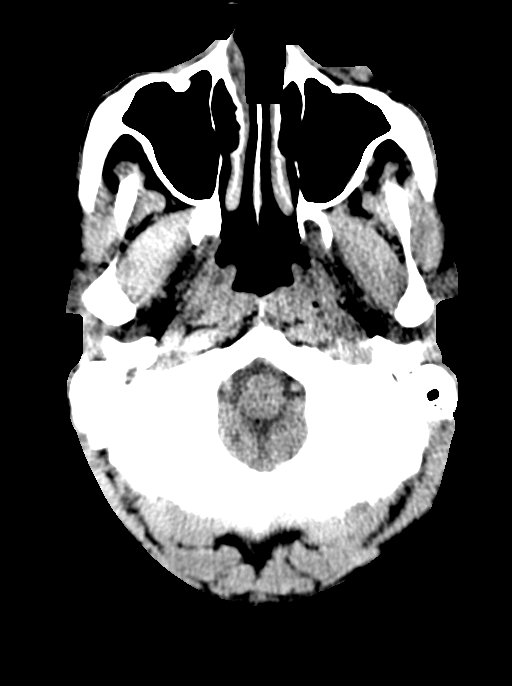
[im 18/44  brain]
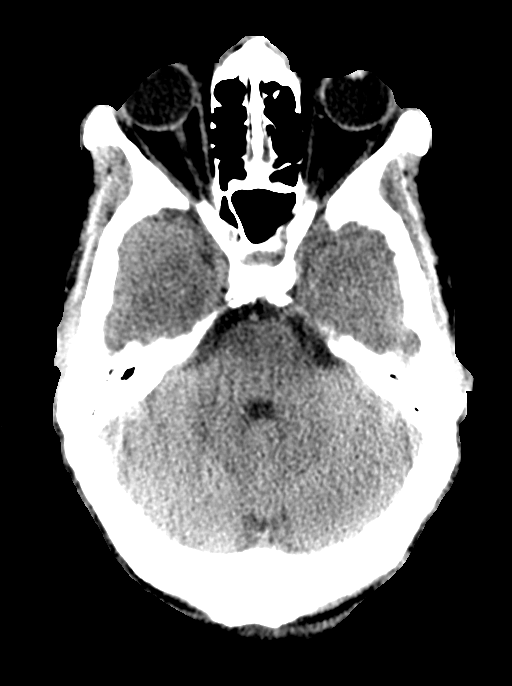
[im 23/44  brain]
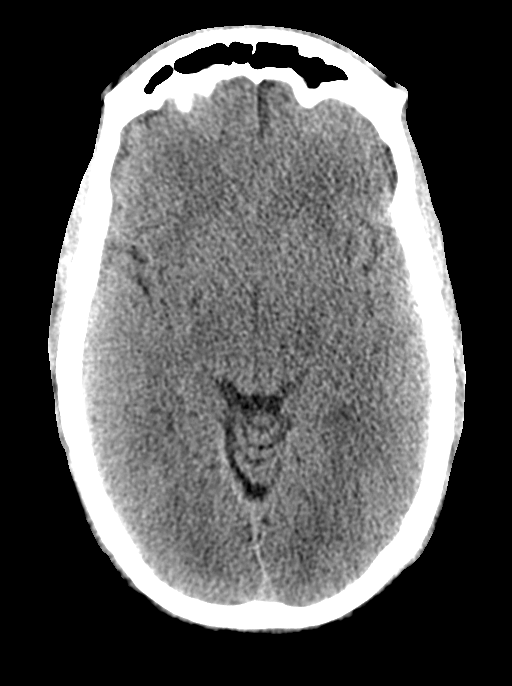
[im 23/44  bone]
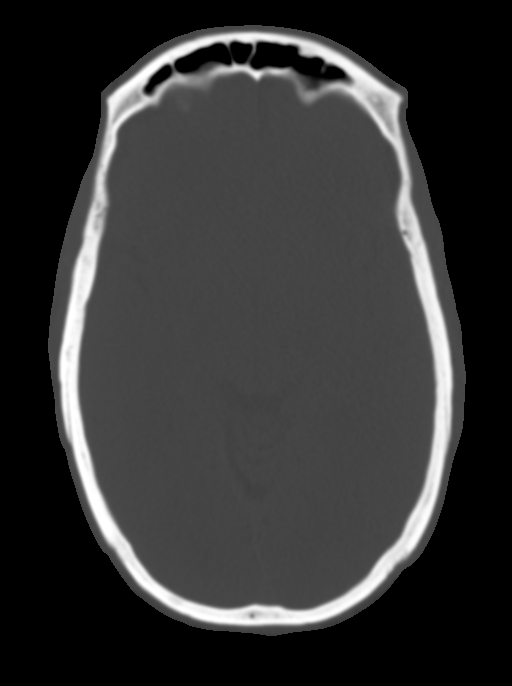
[im 26/44  brain]
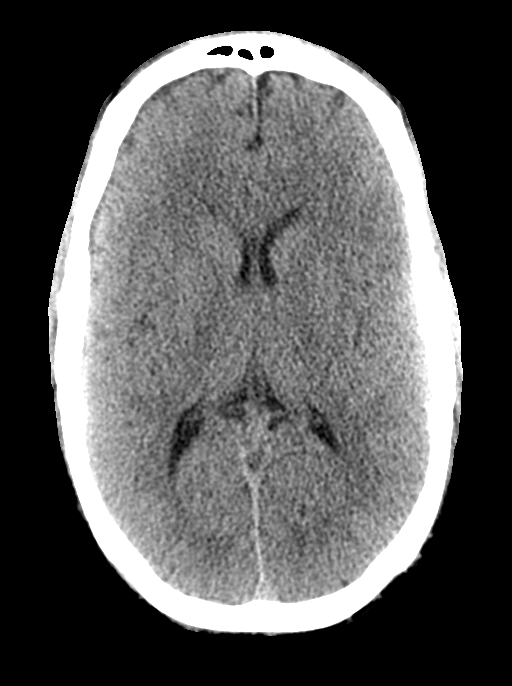
[im 32/44  brain]
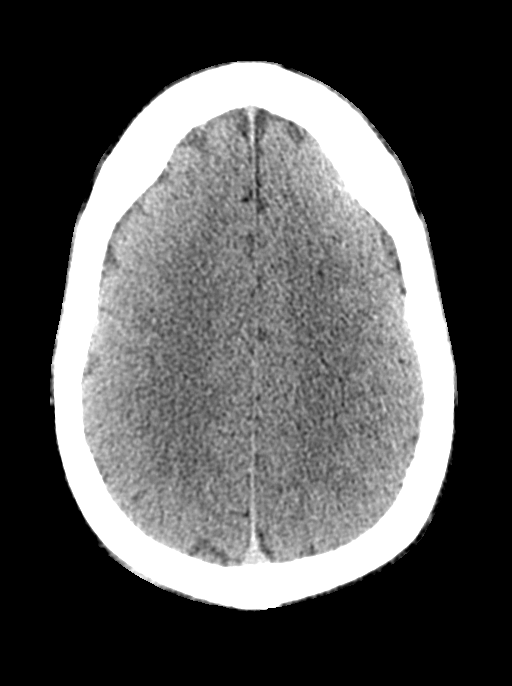
[im 35/44  brain]
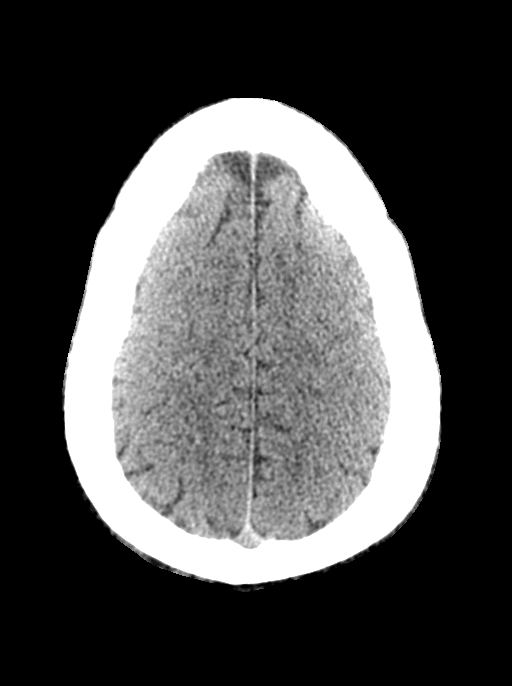
[im 41/44  brain]
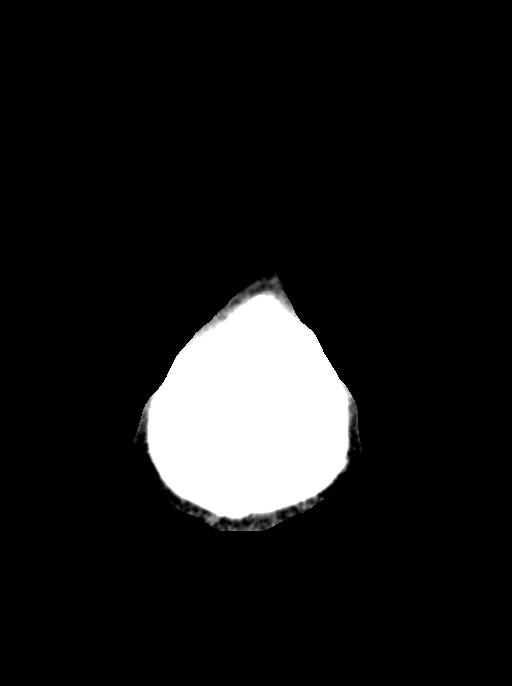
[im 41/44  bone]
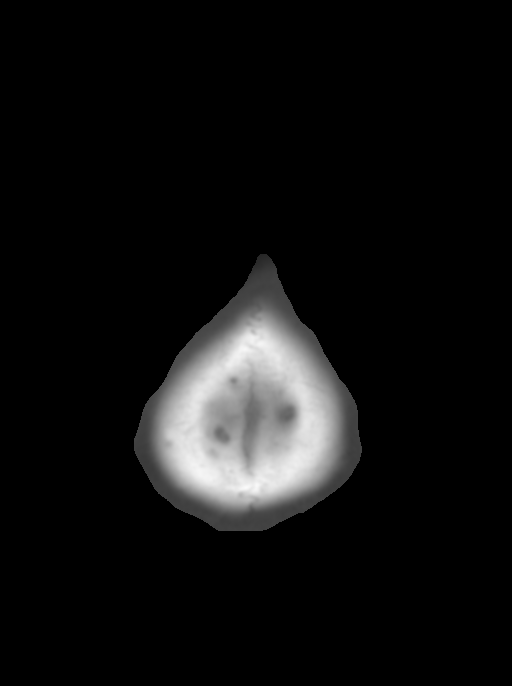

[Series 6: coronal soft tissue · coronal · 0.35mm/px · 3 of 77 slices shown]
[im 26/77  brain]
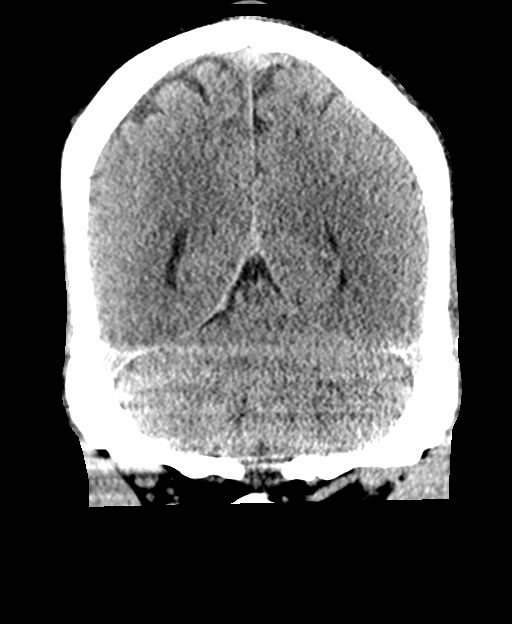
[im 34/77  brain]
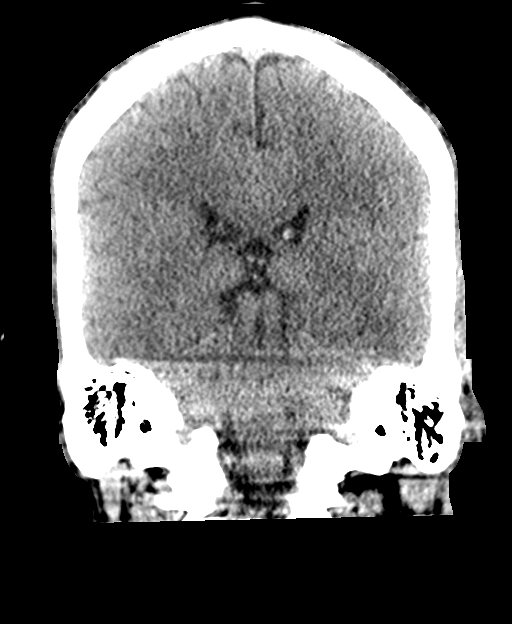
[im 43/77  brain]
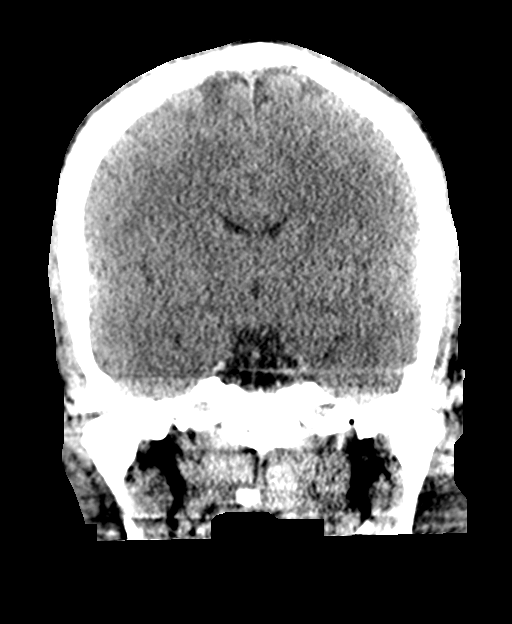

[12 of 47 positions shown; findings below may reference images not displayed]

FINDINGS: Brain: Cerebral volume is stable and within normal limits. No
midline shift, ventriculomegaly, mass effect, evidence of mass
lesion, intracranial hemorrhage or evidence of cortically based
acute infarction. Gray-white matter differentiation is within normal
limits throughout the brain.

Vascular: No suspicious intracranial vascular hyperdensity.

Skull: Scaphocephaly. No acute osseous abnormality identified.
Dental caries and prominent left maxillary periapical dental lucency
noted (series 5, image 94).

Sinuses/Orbits: Stable and well pneumatized. There is mild maxillary
alveolar recess mucosal thickening. Bilateral tympanic cavities and
mastoids remain clear.

Other: Negative orbit and scalp soft tissues.
IMPRESSION: 1. No acute intracranial abnormality. Stable and negative
noncontrast CT appearance of the brain.
2. Dental caries.

## 2017-08-20 MED ORDER — LORAZEPAM 2 MG/ML IJ SOLN
0.5000 mg | Freq: Once | INTRAMUSCULAR | Status: AC
  Administered 2017-08-20: 0.5 mg via INTRAVENOUS
  Filled 2017-08-20: qty 1

## 2017-08-20 MED ORDER — SODIUM CHLORIDE 0.9 % IV SOLN
750.0000 mg | Freq: Once | INTRAVENOUS | Status: AC
  Administered 2017-08-20: 750 mg via INTRAVENOUS
  Filled 2017-08-20: qty 7.5

## 2017-08-20 MED ORDER — ONDANSETRON HCL 4 MG/2ML IJ SOLN
INTRAMUSCULAR | Status: AC
  Administered 2017-08-20: 05:00:00
  Filled 2017-08-20: qty 2

## 2017-08-20 NOTE — ED Notes (Signed)
PT remains unable to stay in conversation without falling asleep. Attempted to contact a ride for patient.

## 2017-08-20 NOTE — ED Triage Notes (Addendum)
Pt bib [facility] for seizures (3rd call tonight, prior 2 refused transport). Pt uncooperative upon arrival, oriented. Pt states girlfriend is who called 911. Reports missing 1 dose of keppra yesterday. C/o nausea and "I don't feel good, I'm cold"

## 2017-08-20 NOTE — ED Notes (Signed)
Pt woken up to go over discharge paperwork. Pt extremely sleepy and struggles to stay awake for instructions. Phone provided for patient to call a ride. MD spoke with about plan to find a ride for discharge.

## 2017-08-20 NOTE — ED Provider Notes (Signed)
[facility] Emergency Department Provider Note   ____________________________________________   First MD Initiated Contact with Patient 08/20/17 0542     (approximate)  I have reviewed the triage vital signs and the nursing notes.   HISTORY  Chief Complaint Seizures  Level 5 caveat: History limited by post ictal status  HPI Luis Hunter is a 42 y.o. male brought to the ED from home via EMS with a chief complaint of seizures.  Patient has a history of seizure disorder on Keppra; also history of schizophrenia and bipolar disorder.  Reportedly EMS had been called out to the house twice last evening for seizure with patient refusing transport.  Girlfriend called when patient experienced another seizure tonight.  Reportedly patient has missed some doses of his Keppra.  States he was incontinent of urine.  States he is cold and does not feel good; also notes nausea.  Denies recent fever, chills, chest pain, shortness of breath, abdominal pain, vomiting, diarrhea.  Denies recent travel or trauma.   Past Medical History:  Diagnosis Date  . Bipolar disorder ([place])   . Depression   . Schizophrenia ([place])   . Seizures [facility])     Patient Active Problem List   Diagnosis Date Noted  . Seizure ([place]) 06/05/2017  . BPPV (benign paroxysmal positional vertigo) 11/16/2015  . Dizziness 11/16/2015    Past Surgical History:  Procedure Laterality Date  . FINGER SURGERY      Prior to Admission medications   Medication Sig Start Date End Date Taking? Authorizing Provider  ARIPiprazole ER (ABILIFY MAINTENA) 400 MG SRER Inject 400 mg as directed every 30 (thirty) days. 11/08/15   [provider]  levETIRAcetam (KEPPRA) 750 MG tablet Take 1 tablet (750 mg total) by mouth 2 (two) times daily. 06/06/17   Nicholes Mango, MD  meclizine (ANTIVERT) 25 MG tablet Take 1 tablet (25 mg total) by mouth 3 (three) times daily as needed for dizziness. Patient not taking: Reported on  06/05/2017 11/16/15   Roselee Nova, MD  senna-docusate (SENOKOT-S) 8.6-50 MG tablet Take 1 tablet by mouth at bedtime as needed for mild constipation. 06/06/17   Nicholes Mango, MD  Vitamin D, Ergocalciferol, (DRISDOL) 50000 units CAPS capsule Take 50,000 Units by mouth every 7 (seven) days.    [provider]    Allergies Patient has no known allergies.  No family history on file.  Social History Social History   Tobacco Use  . Smoking status: Current Every Day Smoker  . Smokeless tobacco: Never Used  Substance Use Topics  . Alcohol use: No    Alcohol/week: 0.0 oz  . Drug use: Yes    Types: Marijuana    Review of Systems  Constitutional: No fever/chills. Eyes: No visual changes. ENT: No sore throat. Cardiovascular: Denies chest pain. Respiratory: Denies shortness of breath. Gastrointestinal: No abdominal pain.  Positive for nausea, no vomiting.  No diarrhea.  No constipation. Genitourinary: Negative for dysuria. Musculoskeletal: Negative for back pain. Skin: Negative for rash. Neurological: Positive for seizures.  Negative for headaches, focal weakness or numbness.   ____________________________________________   PHYSICAL EXAM:  VITAL SIGNS: ED Triage Vitals  Enc Vitals Group     BP 08/20/17 0517 140/83     Pulse Rate 08/20/17 0530 73     Resp 08/20/17 0518 15     Temp 08/20/17 0530 98.1 F (36.7 C)     Temp Source 08/20/17 0530 Oral     SpO2 08/20/17 0530 97 %  Weight --      Height --      Head Circumference --      Peak Flow --      Pain Score --      Pain Loc --      Pain Edu? --      Excl. in [place]? --     Constitutional: Postictal.  Disheveled appearing and in no acute distress. Eyes: Conjunctivae are normal. PERRL. EOMI. Head: Abrasion noted to left forehead. Nose: No congestion/rhinnorhea. Mouth/Throat: Mucous membranes are moist.  No tongue laceration, but tongue appears bruised. Neck: No stridor.  No cervical spine tenderness to  palpation.  Supple neck without meningismus. Cardiovascular: Normal rate, regular rhythm. Grossly normal heart sounds.  Good peripheral circulation. Respiratory: Normal respiratory effort.  No retractions. Lungs CTAB. Gastrointestinal: Soft and nontender. No distention. No abdominal bruits. No CVA tenderness. Musculoskeletal: No lower extremity tenderness nor edema.  No joint effusions. Neurologic: Alert and oriented to person.  Normal speech and language. No gross focal neurologic deficits are appreciated. MAEx4. Skin:  Skin is warm, dry and intact. No rash noted.  No petechiae. Psychiatric: Mood and affect are normal. Speech and behavior are normal.  ____________________________________________   LABS (all labs ordered are listed, but only abnormal results are displayed)  Labs Reviewed  CBC WITH DIFFERENTIAL/PLATELET - Abnormal; Notable for the following components:      Result Value   WBC 14.8 (*)    RDW 16.2 (*)    Neutro Abs 13.2 (*)    Lymphs Abs 0.6 (*)    All other components within normal limits  BASIC METABOLIC PANEL - Abnormal; Notable for the following components:   Glucose, Bld 102 (*)    All other components within normal limits  ETHANOL  URINE DRUG SCREEN, QUALITATIVE ([facility] ONLY)   ____________________________________________  EKG  None ____________________________________________  RADIOLOGY  ED MD interpretation: Pending  Official radiology report(s): No results found.  ____________________________________________   PROCEDURES  Procedure(s) performed: None  Procedures  Critical Care performed: No  ____________________________________________   INITIAL IMPRESSION / ASSESSMENT AND PLAN / ED COURSE  As part of my medical decision making, I reviewed the following data within the [place] notes reviewed and incorporated, Labs reviewed, Old chart reviewed, Radiograph reviewed  and Notes from prior ED  visits   42 year old male with history of seizure disorder who recently missed his Keppra presenting with seizure.  Differential diagnosis includes but is not limited to seizure, ICH, CVA, infectious, metabolic etiologies, etc.  Will initiate IV Keppra, Ativan, Zofran for nausea and dry heaving.  Will check toxicological lab work and urinalysis.  Abrasion noted to forehead; will obtain CT head to evaluate for intracranial abnormalities.  Clinical Course as of Aug 21 706  Wed Aug 20, 2017  0707 Care transferred to Dr. Jimmye Norman at change of shift.  Patient is pending results of CT head, urine drug screen and disposition.  Anticipate discharge home if CT head is unremarkable and patient clears his sensorium is back at baseline.   [JS]    Clinical Course User Index [JS] Paulette Blanch, MD     ____________________________________________   FINAL CLINICAL IMPRESSION(S) / ED DIAGNOSES  Final diagnoses:  Seizure ([place])  Abrasion     ED Discharge Orders    None       Note:  This document was prepared using Dragon voice recognition software and may include unintentional dictation errors.    Paulette Blanch, MD  08/20/17 0708  

## 2017-08-20 NOTE — ED Provider Notes (Signed)
Patient is awake and alert, negative CT imaging.  Patient had received IV Keppra.  He is stable for discharge.   [name], MD 08/20/17 [phone]

## 2017-08-20 NOTE — ED Notes (Signed)
PT still attempting to call ride

## 2017-09-11 DIAGNOSIS — F319 Bipolar disorder, unspecified: Secondary | ICD-10-CM | POA: Diagnosis not present

## 2017-09-11 DIAGNOSIS — M79675 Pain in left toe(s): Secondary | ICD-10-CM | POA: Diagnosis not present

## 2017-09-11 DIAGNOSIS — M10072 Idiopathic gout, left ankle and foot: Secondary | ICD-10-CM | POA: Diagnosis not present

## 2017-09-11 DIAGNOSIS — F17200 Nicotine dependence, unspecified, uncomplicated: Secondary | ICD-10-CM | POA: Diagnosis not present

## 2017-09-17 DIAGNOSIS — R5383 Other fatigue: Secondary | ICD-10-CM | POA: Diagnosis not present

## 2017-09-17 DIAGNOSIS — E7800 Pure hypercholesterolemia, unspecified: Secondary | ICD-10-CM | POA: Diagnosis not present

## 2017-09-17 DIAGNOSIS — R55 Syncope and collapse: Secondary | ICD-10-CM | POA: Diagnosis not present

## 2017-09-17 DIAGNOSIS — M109 Gout, unspecified: Secondary | ICD-10-CM | POA: Diagnosis not present

## 2017-09-17 DIAGNOSIS — Z716 Tobacco abuse counseling: Secondary | ICD-10-CM | POA: Diagnosis not present

## 2017-09-17 DIAGNOSIS — G5643 Causalgia of bilateral upper limbs: Secondary | ICD-10-CM | POA: Diagnosis not present

## 2017-09-17 DIAGNOSIS — Z87891 Personal history of nicotine dependence: Secondary | ICD-10-CM | POA: Diagnosis not present

## 2017-09-17 DIAGNOSIS — F17200 Nicotine dependence, unspecified, uncomplicated: Secondary | ICD-10-CM | POA: Diagnosis not present

## 2017-09-17 DIAGNOSIS — F17210 Nicotine dependence, cigarettes, uncomplicated: Secondary | ICD-10-CM | POA: Diagnosis not present

## 2017-09-17 DIAGNOSIS — E559 Vitamin D deficiency, unspecified: Secondary | ICD-10-CM | POA: Diagnosis not present

## 2017-09-17 DIAGNOSIS — Z1389 Encounter for screening for other disorder: Secondary | ICD-10-CM | POA: Diagnosis not present

## 2017-09-17 DIAGNOSIS — R0989 Other specified symptoms and signs involving the circulatory and respiratory systems: Secondary | ICD-10-CM | POA: Diagnosis not present

## 2017-09-17 DIAGNOSIS — G43709 Chronic migraine without aura, not intractable, without status migrainosus: Secondary | ICD-10-CM | POA: Diagnosis not present

## 2018-11-16 ENCOUNTER — Other Ambulatory Visit: Payer: Self-pay

## 2018-11-16 DIAGNOSIS — Z5321 Procedure and treatment not carried out due to patient leaving prior to being seen by health care provider: Secondary | ICD-10-CM | POA: Insufficient documentation

## 2018-11-16 DIAGNOSIS — R079 Chest pain, unspecified: Secondary | ICD-10-CM | POA: Diagnosis not present

## 2018-11-16 NOTE — ED Triage Notes (Signed)
Pt to the er c/o left rib pain after falling on a table 2 days ago.

## 2018-11-17 ENCOUNTER — Emergency Department
Admission: EM | Admit: 2018-11-17 | Discharge: 2018-11-17 | Disposition: A | Discharge status: Home / Self Care | Payer: Medicare HMO | Attending: Emergency Medicine | Admitting: Emergency Medicine

## 2019-01-12 ENCOUNTER — Encounter: Payer: Self-pay | Admitting: *Deleted

## 2019-01-12 ENCOUNTER — Other Ambulatory Visit: Payer: Self-pay

## 2019-01-12 ENCOUNTER — Emergency Department
Admission: EM | Admit: 2019-01-12 | Discharge: 2019-01-12 | Disposition: A | Discharge status: Home / Self Care | Payer: Medicare HMO | Attending: Emergency Medicine | Admitting: Emergency Medicine

## 2019-01-12 DIAGNOSIS — R569 Unspecified convulsions: Secondary | ICD-10-CM | POA: Diagnosis not present

## 2019-01-12 DIAGNOSIS — F17210 Nicotine dependence, cigarettes, uncomplicated: Secondary | ICD-10-CM | POA: Insufficient documentation

## 2019-01-12 DIAGNOSIS — Z79899 Other long term (current) drug therapy: Secondary | ICD-10-CM | POA: Insufficient documentation

## 2019-01-12 DIAGNOSIS — Z9114 Patient's other noncompliance with medication regimen: Secondary | ICD-10-CM | POA: Diagnosis not present

## 2019-01-12 LAB — CBC WITH DIFFERENTIAL/PLATELET
Abs Immature Granulocytes: 0.18 10*3/uL — ABNORMAL HIGH (ref 0.00–0.07)
Basophils Absolute: 0.1 10*3/uL (ref 0.0–0.1)
Basophils Relative: 0 %
Eosinophils Absolute: 0.2 10*3/uL (ref 0.0–0.5)
Eosinophils Relative: 1 %
HCT: 44.5 % (ref 39.0–52.0)
Hemoglobin: 14.6 g/dL (ref 13.0–17.0)
Immature Granulocytes: 1 %
Lymphocytes Relative: 5 %
Lymphs Abs: 0.9 10*3/uL (ref 0.7–4.0)
MCH: 29.7 pg (ref 26.0–34.0)
MCHC: 32.8 g/dL (ref 30.0–36.0)
MCV: 90.4 fL (ref 80.0–100.0)
Monocytes Absolute: 1 10*3/uL (ref 0.1–1.0)
Monocytes Relative: 6 %
Neutro Abs: 14.4 10*3/uL — ABNORMAL HIGH (ref 1.7–7.7)
Neutrophils Relative %: 87 %
Platelets: 201 10*3/uL (ref 150–400)
RBC: 4.92 MIL/uL (ref 4.22–5.81)
RDW: 14.7 % (ref 11.5–15.5)
WBC: 16.8 10*3/uL — ABNORMAL HIGH (ref 4.0–10.5)
nRBC: 0 % (ref 0.0–0.2)

## 2019-01-12 LAB — BASIC METABOLIC PANEL
Anion gap: 11 (ref 5–15)
BUN: 15 mg/dL (ref 6–20)
CO2: 22 mmol/L (ref 22–32)
Calcium: 9.2 mg/dL (ref 8.9–10.3)
Chloride: 109 mmol/L (ref 98–111)
Creatinine, Ser: 1.33 mg/dL — ABNORMAL HIGH (ref 0.61–1.24)
GFR calc Af Amer: >60 mL/min (ref >60)
GFR calc non Af Amer: >60 mL/min (ref >60)
Glucose, Bld: 116 mg/dL — ABNORMAL HIGH (ref 70–99)
Potassium: 3.5 mmol/L (ref 3.5–5.1)
Sodium: 142 mmol/L (ref 135–145)

## 2019-01-12 LAB — MAGNESIUM: Magnesium: 2.8 mg/dL — ABNORMAL HIGH (ref 1.7–2.4)

## 2019-01-12 MED ORDER — SODIUM CHLORIDE 0.9 % IV SOLN
2000.0000 mg | Freq: Once | INTRAVENOUS | Status: AC
  Administered 2019-01-12: 05:00:00 2000 mg via INTRAVENOUS
  Filled 2019-01-12: qty 20

## 2019-01-12 MED ORDER — SODIUM CHLORIDE 0.9 % IV BOLUS
1000.0000 mL | Freq: Once | INTRAVENOUS | Status: AC
  Administered 2019-01-12: 05:00:00 1000 mL via INTRAVENOUS

## 2019-01-12 MED ORDER — LEVETIRACETAM 750 MG PO TABS: 750.0000 mg | ORAL_TABLET | Freq: Two times a day (BID) | ORAL | 0 refills | Status: DC

## 2019-01-12 MED ORDER — LORAZEPAM 2 MG/ML IJ SOLN
1.0000 mg | Freq: Once | INTRAMUSCULAR | Status: AC
  Administered 2019-01-12: 04:00:00 1 mg via INTRAVENOUS

## 2019-01-12 NOTE — ED Provider Notes (Signed)
Patient is back to his baseline, no seizure activity, he is not willing to stay in the hospital for any further treatment.  Strongly recommended taking Keppra as prescribed   Lavonia Drafts, MD 01/12/19 1049

## 2019-01-12 NOTE — ED Provider Notes (Signed)
[facility] Emergency Department Provider Note  ____________________________________________   First MD Initiated Contact with Patient 01/12/19 0422     (approximate)  I have reviewed the triage vital signs and the nursing notes.   HISTORY  Chief Complaint Seizures    HPI Luis Hunter is a 43 y.o. male with history of bipolar disorder, seizures, schizophrenia, with history of medication nonadherence, here with recurrent seizures.  The patient reportedly had 3 witnessed seizures overnight.  EMS was called multiple times, and refused transport when he woke up. On his last seizure, he was more prolonged postictal so he allowed EMS to transport him.  Per report, the patient has a history of chronic nonadherence with his seizure meds.  He denied any complaints initially with EMS when they saw him the first 2 seizures and he refused transport.    Past Medical History:  Diagnosis Date  . Bipolar disorder ([place])   . Depression   . Schizophrenia ([place])   . Seizures [facility])     Patient Active Problem List   Diagnosis Date Noted  . Seizure ([place]) 06/05/2017  . BPPV (benign paroxysmal positional vertigo) 11/16/2015  . Dizziness 11/16/2015    Past Surgical History:  Procedure Laterality Date  . FINGER SURGERY      Prior to Admission medications   Medication Sig Start Date End Date Taking? Authorizing Provider  ARIPiprazole ER (ABILIFY MAINTENA) 400 MG PRSY prefilled syringe Inject 400 mg as directed every 30 (thirty) days.  11/08/15  Yes [provider]  levETIRAcetam (KEPPRA) 750 MG tablet Take 1 tablet (750 mg total) by mouth 2 (two) times daily. 01/12/19   Duffy Bruce, MD  meclizine (ANTIVERT) 25 MG tablet Take 1 tablet (25 mg total) by mouth 3 (three) times daily as needed for dizziness. Patient not taking: Reported on 06/05/2017 11/16/15   Roselee Nova, MD  senna-docusate (SENOKOT-S) 8.6-50 MG tablet Take 1 tablet by mouth at bedtime as  needed for mild constipation. 06/06/17   Nicholes Mango, MD  Vitamin D, Ergocalciferol, (DRISDOL) 50000 units CAPS capsule Take 50,000 Units by mouth every 7 (seven) days.    [provider]    Allergies Patient has no known allergies.  No family history on file.  Social History Social History   Tobacco Use  . Smoking status: Current Every Day Smoker  . Smokeless tobacco: Never Used  Substance Use Topics  . Alcohol use: No    Alcohol/week: 0.0 standard drinks  . Drug use: Yes    Types: Marijuana    Review of Systems  Review of Systems  Unable to perform ROS: Mental status change     ____________________________________________  PHYSICAL EXAM:      VITAL SIGNS: ED Triage Vitals  Enc Vitals Group     BP 01/12/19 0430 105/68     Pulse Rate 01/12/19 0430 78     Resp 01/12/19 0430 19     Temp --      Temp src --      SpO2 01/12/19 0430 100 %     Weight 01/12/19 0434 200 lb (90.7 kg)     Height 01/12/19 0434 6' (1.829 m)     Head Circumference --      Peak Flow --      Pain Score --      Pain Loc --      Pain Edu? --      Excl. in [place]? --  Physical Exam Vitals signs and nursing note reviewed.  Constitutional:      General: He is not in acute distress.    Appearance: He is well-developed.  HENT:     Head: Normocephalic and atraumatic.  Eyes:     Conjunctiva/sclera: Conjunctivae normal.  Neck:     Musculoskeletal: Neck supple.  Cardiovascular:     Rate and Rhythm: Normal rate and regular rhythm.     Heart sounds: Normal heart sounds. No murmur. No friction rub.  Pulmonary:     Effort: Pulmonary effort is normal. No respiratory distress.     Breath sounds: Normal breath sounds. No wheezing or rales.  Abdominal:     General: There is no distension.     Palpations: Abdomen is soft.     Tenderness: There is no abdominal tenderness.  Skin:    General: Skin is warm.     Capillary Refill: Capillary refill takes less than 2 seconds.  Neurological:      Mental Status: He is alert.     Motor: No abnormal muscle tone.     Comments: Post-ictal. Intermittently agitated. Does not follow commands. Protecting airway.       ____________________________________________   LABS (all labs ordered are listed, but only abnormal results are displayed)  Labs Reviewed  CBC WITH DIFFERENTIAL/PLATELET - Abnormal; Notable for the following components:      Result Value   WBC 16.8 (*)    Neutro Abs 14.4 (*)    Abs Immature Granulocytes 0.18 (*)    All other components within normal limits  BASIC METABOLIC PANEL - Abnormal; Notable for the following components:   Glucose, Bld 116 (*)    Creatinine, Ser 1.33 (*)    All other components within normal limits  MAGNESIUM - Abnormal; Notable for the following components:   Magnesium 2.8 (*)    All other components within normal limits  CBG MONITORING, ED    ____________________________________________  EKG: Normal sinus rhythm, diffuse J point elevation likely BER. No ischemic changes. ________________________________________  RADIOLOGY All imaging, including plain films, CT scans, and ultrasounds, independently reviewed by me, and interpretations confirmed via formal radiology reads.  ED MD interpretation:   None  Official radiology report(s): No results found.  ____________________________________________  PROCEDURES   Procedure(s) performed (including Critical Care):  Procedures  ____________________________________________  INITIAL IMPRESSION / MDM / [place] / ED COURSE  As part of my medical decision making, I reviewed the following data within the electronic MEDICAL RECORD NUMBER Notes from prior ED visits and [place] Controlled Substance Database      *Luis Hunter was evaluated in Emergency Department on 01/12/2019 for the symptoms described in the history of present illness. He was evaluated in the context of the global COVID-19 pandemic, which necessitated consideration  that the patient might be at risk for infection with the SARS-CoV-2 virus that causes COVID-19. Institutional protocols and algorithms that pertain to the evaluation of patients at risk for COVID-19 are in a state of rapid change based on information released by regulatory bodies including the CDC and federal and state organizations. These policies and algorithms were followed during the patient's care in the ED.  Some ED evaluations and interventions may be delayed as a result of limited staffing during the pandemic.*      Medical Decision Making: 43 yo M here with break through seizure activity. History of same. He initially refused transport x 2 but is now post-ictal in ED. Will load with Keppra  2 g IV. Ativan 1 mg IV x 1. Suspect this is 2/2 nonadherence. No apparent infectious etiology. He has no focal neuro deficits. Plan to monitor until resolution of post-ictal state then re-assess.  Patient care transferred to Dr. Corky Downs at the end of my shift. Patient presentation, ED course, and plan of care discussed with review of all pertinent labs and imaging. Please see his/her note for further details regarding further ED course and disposition.   ____________________________________________  FINAL CLINICAL IMPRESSION(S) / ED DIAGNOSES  Final diagnoses:  Seizure ([place])  Nonadherence to medication     MEDICATIONS GIVEN DURING THIS VISIT:  Medications  levETIRAcetam (KEPPRA) 2,000 mg in sodium chloride 0.9 % 100 mL IVPB (0 mg Intravenous Stopped 01/12/19 0517)  sodium chloride 0.9 % bolus 1,000 mL (0 mLs Intravenous Stopped 01/12/19 0657)  LORazepam (ATIVAN) injection 1 mg (1 mg Intravenous Given 01/12/19 0425)     ED Discharge Orders         Ordered    levETIRAcetam (KEPPRA) 750 MG tablet  2 times daily     01/12/19 [id number]           Note:  This document was prepared using Dragon voice recognition software and may include unintentional dictation errors.   Duffy Bruce, MD 01/12/19  [phone]

## 2019-01-12 NOTE — ED Notes (Signed)
Confirmed once again with Luis Hunter that she is coming to pick pt up

## 2019-01-12 NOTE — ED Notes (Signed)
Attempted to call Luis Hunter per patient's request but could not get through

## 2019-01-12 NOTE — ED Triage Notes (Signed)
Pt states he takes his Keppra but rx bottle 1/4 full and should have been completed in June if he was taking meds

## 2019-01-12 NOTE — ED Notes (Signed)
Patient ambulatory to use restroom. Patient unsteady on feet, but able to walk to toilet and back to bed. Permission given to this RN to call Claiborne Billings, a friend, for a ride.

## 2019-01-12 NOTE — ED Notes (Signed)
E-signature pad unavailable to use at time of discharge, discharge instructions given and paperwork explained to patient who voices understanding of teaching.

## 2019-01-12 NOTE — ED Triage Notes (Signed)
Arrived via EMS after seizure. EMS went out 1st time and pt refused transport. Then he had another seizure and EMS was called back out and pt was postictal, incontinent and disoriented. Pt came around and again wanted to refuse transport. [employer] stated they would IVC pt if he refused care. Pt then had a seizure witnessed by EMS.IM Versed given. On arrival pt is agitated and trying to pull at cords. EDP at bedside with EMS

## 2019-01-12 NOTE — ED Notes (Signed)
Received a call from [employer] who will be picking patient up

## 2019-01-12 NOTE — ED Notes (Signed)
Tried to call number listed for Luis Hunter in patient's phone [phone] but did not reach anyone, pt too drowsy at this time to provide info or unlock his phone for me

## 2019-01-12 NOTE — ED Notes (Signed)
Patient lying in bed with eyes closed. Even respirations noted. Patient repositioning himself periodically.

## 2019-01-12 NOTE — ED Notes (Signed)
I received a call from [place] who states she is patients girlfriend, verbal permission given from patient to speak to [place]. Luis Hunter asks if pt wants him to come and get him because she "has his boots" and pt states "yes". Attempted to contact Claiborne Billings who was originally coming to get patient but was unable to reach. Claiborne Billings showed up in the waiting room in the meantime but I advised him that patient requested that [place] to come and pick him up.

## 2019-01-12 NOTE — ED Notes (Signed)
Pt resting in bed, no complaints at this time, pt trying to find someone to come and pick him up. I went through all of his contacts in his chart and the numbers are not correct or the family members are in [place] per patient. Pt trying to call someone from his phone at this time

## 2019-01-12 NOTE — ED Notes (Signed)
On arrival pt fighting and pulling at cords and IV. IV from EMS dislodged.

## 2019-01-12 NOTE — Discharge Instructions (Addendum)
Take your medications as prescribed  Is critically important you take your Keppra.  I provided a refill for this if you do not have it.  Avoid alcohol

## 2019-06-29 ENCOUNTER — Other Ambulatory Visit: Payer: Self-pay

## 2019-06-29 ENCOUNTER — Emergency Department
Admission: EM | Admit: 2019-06-29 | Discharge: 2019-06-29 | Disposition: A | Discharge status: Home / Self Care | Payer: Medicare HMO | Attending: Emergency Medicine | Admitting: Emergency Medicine

## 2019-06-29 DIAGNOSIS — R569 Unspecified convulsions: Secondary | ICD-10-CM | POA: Insufficient documentation

## 2019-06-29 DIAGNOSIS — F17200 Nicotine dependence, unspecified, uncomplicated: Secondary | ICD-10-CM | POA: Insufficient documentation

## 2019-06-29 LAB — CBC WITH DIFFERENTIAL/PLATELET
Abs Immature Granulocytes: 0.12 10*3/uL — ABNORMAL HIGH (ref 0.00–0.07)
Basophils Absolute: 0 10*3/uL (ref 0.0–0.1)
Basophils Relative: 0 %
Eosinophils Absolute: 0 10*3/uL (ref 0.0–0.5)
Eosinophils Relative: 0 %
HCT: 44.2 % (ref 39.0–52.0)
Hemoglobin: 14.5 g/dL (ref 13.0–17.0)
Immature Granulocytes: 1 %
Lymphocytes Relative: 6 %
Lymphs Abs: 0.9 10*3/uL (ref 0.7–4.0)
MCH: 29.4 pg (ref 26.0–34.0)
MCHC: 32.8 g/dL (ref 30.0–36.0)
MCV: 89.7 fL (ref 80.0–100.0)
Monocytes Absolute: 0.7 10*3/uL (ref 0.1–1.0)
Monocytes Relative: 5 %
Neutro Abs: 14.4 10*3/uL — ABNORMAL HIGH (ref 1.7–7.7)
Neutrophils Relative %: 88 %
Platelets: 174 10*3/uL (ref 150–400)
RBC: 4.93 MIL/uL (ref 4.22–5.81)
RDW: 15.1 % (ref 11.5–15.5)
WBC: 16.2 10*3/uL — ABNORMAL HIGH (ref 4.0–10.5)
nRBC: 0 % (ref 0.0–0.2)

## 2019-06-29 LAB — COMPREHENSIVE METABOLIC PANEL
ALT: 16 U/L (ref 0–44)
AST: 30 U/L (ref 15–41)
Albumin: 4.4 g/dL (ref 3.5–5.0)
Alkaline Phosphatase: 60 U/L (ref 38–126)
Anion gap: 11 (ref 5–15)
BUN: 13 mg/dL (ref 6–20)
CO2: 22 mmol/L (ref 22–32)
Calcium: 9.1 mg/dL (ref 8.9–10.3)
Chloride: 105 mmol/L (ref 98–111)
Creatinine, Ser: 1.08 mg/dL (ref 0.61–1.24)
GFR calc Af Amer: >60 mL/min (ref >60)
GFR calc non Af Amer: >60 mL/min (ref >60)
Glucose, Bld: 106 mg/dL — ABNORMAL HIGH (ref 70–99)
Potassium: 3.9 mmol/L (ref 3.5–5.1)
Sodium: 138 mmol/L (ref 135–145)
Total Bilirubin: 0.6 mg/dL (ref 0.3–1.2)
Total Protein: 7.4 g/dL (ref 6.5–8.1)

## 2019-06-29 MED ORDER — LEVETIRACETAM IN NACL 1000 MG/100ML IV SOLN
1000.0000 mg | Freq: Once | INTRAVENOUS | Status: AC
  Administered 2019-06-29: 1000 mg via INTRAVENOUS
  Filled 2019-06-29: qty 100

## 2019-06-29 MED ORDER — LEVETIRACETAM 1000 MG PO TABS: 1000.0000 mg | ORAL_TABLET | Freq: Two times a day (BID) | ORAL | 2 refills | Status: DC

## 2019-06-29 MED ORDER — SODIUM CHLORIDE 0.9 % IV SOLN
1000.0000 mL | Freq: Once | INTRAVENOUS | Status: AC
  Administered 2019-06-29: 10:00:00 1000 mL via INTRAVENOUS

## 2019-06-29 NOTE — ED Triage Notes (Signed)
Pt arrived via [facility] from home with seizures. Pt has had a total of 4 seizures since 5 am this morning. Pt takes Keppra, unsure of dose or frequency. Hx of epilepsy.

## 2019-06-29 NOTE — ED Provider Notes (Signed)
[name] Emergency Department Provider Note       Time seen: ----------------------------------------- 9:54 AM on 06/29/2019 -----------------------------------------   I have reviewed the triage vital signs and the nursing notes.  HISTORY   Chief Complaint Seizures    HPI Luis Hunter is a 44 y.o. male with a history of bipolar disorder, depression, schizophrenia, seizures who presents to the ED for seizures.  Patient reportedly has had 4 seizures since 5 AM this morning.  Typically he states he takes Keppra once a day.  Prescription shows he is supposed to be taken twice a day.  EMS reports improvement in his mental status since they arrived on scene.  Past Medical History:  Diagnosis Date  . Bipolar disorder ([place])   . Depression   . Schizophrenia ([place])   . Seizures [facility])     Patient Active Problem List   Diagnosis Date Noted  . Seizure ([place]) 06/05/2017  . BPPV (benign paroxysmal positional vertigo) 11/16/2015  . Dizziness 11/16/2015    Past Surgical History:  Procedure Laterality Date  . FINGER SURGERY      Allergies Patient has no known allergies.  Social History Social History   Tobacco Use  . Smoking status: Current Every Day Smoker  . Smokeless tobacco: Never Used  Substance Use Topics  . Alcohol use: No    Alcohol/week: 0.0 standard drinks  . Drug use: Yes    Types: Marijuana    Review of Systems Constitutional: Negative for fever. Cardiovascular: Negative for chest pain. Respiratory: Negative for shortness of breath. Gastrointestinal: Negative for abdominal pain, vomiting and diarrhea. Musculoskeletal: Negative for back pain. Skin: Negative for rash. Neurological: Negative for headaches, positive for seizures  All systems negative/normal/unremarkable except as stated in the HPI  ____________________________________________   PHYSICAL EXAM:  VITAL SIGNS: ED Triage Vitals  Enc Vitals Group     BP --       Pulse --      Resp --      Temp --      Temp src --      SpO2 --      Weight 06/29/19 0954 171 lb 12.8 oz (77.9 kg)     Height 06/29/19 0954 6' (1.829 m)     Head Circumference --      Peak Flow --      Pain Score 06/29/19 0953 0     Pain Loc --      Pain Edu? --      Excl. in [place]? --     Constitutional: Alert and oriented. Well appearing and in no distress. Eyes: Conjunctivae are normal. Normal extraocular movements. ENT      Head: Normocephalic and atraumatic.      Nose: No congestion/rhinnorhea.      Mouth/Throat: Mucous membranes are moist.      Neck: No stridor. Cardiovascular: Normal rate, regular rhythm. No murmurs, rubs, or gallops. Respiratory: Normal respiratory effort without tachypnea nor retractions. Breath sounds are clear and equal bilaterally. No wheezes/rales/rhonchi. Gastrointestinal: Soft and nontender. Normal bowel sounds Musculoskeletal: Nontender with normal range of motion in extremities. No lower extremity tenderness nor edema. Neurologic:  Normal speech and language. No gross focal neurologic deficits are appreciated.  Skin:  Skin is warm, dry and intact. No rash noted. Psychiatric: Mood and affect are normal. Speech and behavior are normal.  ____________________________________________  ED COURSE:  As part of my medical decision making, I reviewed the following data within the [place]  History obtained from family if available, nursing notes, old chart and ekg, as well as notes from prior ED visits. Patient presented for seizures, we will assess with labs and imaging as indicated at this time.   Procedures  MARTIS DOMMER was evaluated in Emergency Department on 06/29/2019 for the symptoms described in the history of present illness. He was evaluated in the context of the global COVID-19 pandemic, which necessitated consideration that the patient might be at risk for infection with the SARS-CoV-2 virus that causes COVID-19. Institutional  protocols and algorithms that pertain to the evaluation of patients at risk for COVID-19 are in a state of rapid change based on information released by regulatory bodies including the CDC and federal and state organizations. These policies and algorithms were followed during the patient's care in the ED.   EKG: Interpreted by me, sinus rhythm with rate 83 bpm, LVH, normal axis, repolarization abnormality ____________________________________________   LABS (pertinent positives/negatives)  Labs Reviewed  CBC WITH DIFFERENTIAL/PLATELET - Abnormal; Notable for the following components:      Result Value   WBC 16.2 (*)    Neutro Abs 14.4 (*)    Abs Immature Granulocytes 0.12 (*)    All other components within normal limits  COMPREHENSIVE METABOLIC PANEL - Abnormal; Notable for the following components:   Glucose, Bld 106 (*)    All other components within normal limits  URINALYSIS, COMPLETE ([facility]) WITH MICROSCOPIC  LEVETIRACETAM LEVEL  ____________________________________________   DIFFERENTIAL DIAGNOSIS   Seizures, schizophrenia, bipolar disorder, depression, dehydration, electrolyte abnormality  FINAL ASSESSMENT AND PLAN  Seizures   Plan: The patient had presented for seizures.  Mental status appear to be improving, he was given IV Keppra.  Patient's labs not reveal any acute process, leukocytosis likely secondary to seizure.  I will increase his Keppra dose, but otherwise he appears cleared for outpatient follow-up.   [name], MD    Note: This note was generated in part or whole with voice recognition software. Voice recognition is usually quite accurate but there are transcription errors that can and very often do occur. I apologize for any typographical errors that were not detected and corrected.     [name], MD 06/29/19 1251

## 2019-07-01 LAB — LEVETIRACETAM LEVEL: Levetiracetam Lvl: 10.8 ug/mL (ref 10.0–40.0)

## 2019-11-19 ENCOUNTER — Emergency Department: Payer: Medicare Other

## 2019-11-19 ENCOUNTER — Observation Stay (HOSPITAL_COMMUNITY)
Admit: 2019-11-19 | Discharge: 2019-11-19 | Disposition: A | Discharge status: Home / Self Care | Payer: Medicare Other | Attending: Physician Assistant | Admitting: Physician Assistant

## 2019-11-19 ENCOUNTER — Inpatient Hospital Stay
Admission: EM | Admit: 2019-11-19 | Discharge: 2019-11-20 | DRG: 917 | Discharge status: Against Medical Advice | Payer: Medicare Other | Attending: Internal Medicine | Admitting: Internal Medicine

## 2019-11-19 ENCOUNTER — Other Ambulatory Visit: Payer: Self-pay

## 2019-11-19 DIAGNOSIS — Z720 Tobacco use: Secondary | ICD-10-CM | POA: Diagnosis present

## 2019-11-19 DIAGNOSIS — I959 Hypotension, unspecified: Secondary | ICD-10-CM | POA: Diagnosis present

## 2019-11-19 DIAGNOSIS — R7989 Other specified abnormal findings of blood chemistry: Secondary | ICD-10-CM | POA: Diagnosis present

## 2019-11-19 DIAGNOSIS — D72829 Elevated white blood cell count, unspecified: Secondary | ICD-10-CM | POA: Diagnosis present

## 2019-11-19 DIAGNOSIS — I429 Cardiomyopathy, unspecified: Secondary | ICD-10-CM | POA: Diagnosis not present

## 2019-11-19 DIAGNOSIS — Z20822 Contact with and (suspected) exposure to covid-19: Secondary | ICD-10-CM | POA: Diagnosis present

## 2019-11-19 DIAGNOSIS — Z79899 Other long term (current) drug therapy: Secondary | ICD-10-CM | POA: Diagnosis not present

## 2019-11-19 DIAGNOSIS — I248 Other forms of acute ischemic heart disease: Secondary | ICD-10-CM | POA: Diagnosis not present

## 2019-11-19 DIAGNOSIS — F1910 Other psychoactive substance abuse, uncomplicated: Secondary | ICD-10-CM | POA: Diagnosis present

## 2019-11-19 DIAGNOSIS — F209 Schizophrenia, unspecified: Secondary | ICD-10-CM | POA: Diagnosis not present

## 2019-11-19 DIAGNOSIS — F319 Bipolar disorder, unspecified: Secondary | ICD-10-CM | POA: Diagnosis present

## 2019-11-19 DIAGNOSIS — G92 Toxic encephalopathy: Secondary | ICD-10-CM | POA: Diagnosis present

## 2019-11-19 DIAGNOSIS — R569 Unspecified convulsions: Secondary | ICD-10-CM

## 2019-11-19 DIAGNOSIS — F1410 Cocaine abuse, uncomplicated: Secondary | ICD-10-CM | POA: Diagnosis present

## 2019-11-19 DIAGNOSIS — R4189 Other symptoms and signs involving cognitive functions and awareness: Secondary | ICD-10-CM | POA: Diagnosis not present

## 2019-11-19 DIAGNOSIS — R0902 Hypoxemia: Secondary | ICD-10-CM | POA: Diagnosis present

## 2019-11-19 DIAGNOSIS — I214 Non-ST elevation (NSTEMI) myocardial infarction: Secondary | ICD-10-CM | POA: Diagnosis present

## 2019-11-19 DIAGNOSIS — R778 Other specified abnormalities of plasma proteins: Secondary | ICD-10-CM | POA: Diagnosis present

## 2019-11-19 DIAGNOSIS — T405X1A Poisoning by cocaine, accidental (unintentional), initial encounter: Secondary | ICD-10-CM | POA: Diagnosis not present

## 2019-11-19 DIAGNOSIS — R55 Syncope and collapse: Secondary | ICD-10-CM | POA: Diagnosis not present

## 2019-11-19 LAB — TROPONIN I (HIGH SENSITIVITY)
Troponin I (High Sensitivity): 217 ng/L (ref <18)
Troponin I (High Sensitivity): 8 ng/L (ref <18)
Troponin I (High Sensitivity): 12 ng/L (ref <18)
Troponin I (High Sensitivity): 13 ng/L (ref <18)

## 2019-11-19 LAB — COMPREHENSIVE METABOLIC PANEL
ALT: 22 U/L (ref 0–44)
AST: 28 U/L (ref 15–41)
Albumin: 3.7 g/dL (ref 3.5–5.0)
Alkaline Phosphatase: 46 U/L (ref 38–126)
Anion gap: 9 (ref 5–15)
BUN: 15 mg/dL (ref 6–20)
CO2: 19 mmol/L — ABNORMAL LOW (ref 22–32)
Calcium: 7.9 mg/dL — ABNORMAL LOW (ref 8.9–10.3)
Chloride: 112 mmol/L — ABNORMAL HIGH (ref 98–111)
Creatinine, Ser: 1.1 mg/dL (ref 0.61–1.24)
GFR calc Af Amer: >60 mL/min (ref >60)
GFR calc non Af Amer: >60 mL/min (ref >60)
Glucose, Bld: 113 mg/dL — ABNORMAL HIGH (ref 70–99)
Potassium: 3.5 mmol/L (ref 3.5–5.1)
Sodium: 140 mmol/L (ref 135–145)
Total Bilirubin: 0.7 mg/dL (ref 0.3–1.2)
Total Protein: 6.2 g/dL — ABNORMAL LOW (ref 6.5–8.1)

## 2019-11-19 LAB — URINE DRUG SCREEN, QUALITATIVE (ARMC ONLY)
Amphetamines, Ur Screen: NOT DETECTED
Barbiturates, Ur Screen: NOT DETECTED
Benzodiazepine, Ur Scrn: POSITIVE — AB
Cannabinoid 50 Ng, Ur ~~LOC~~: POSITIVE — AB
Cocaine Metabolite,Ur ~~LOC~~: POSITIVE — AB
MDMA (Ecstasy)Ur Screen: NOT DETECTED
Methadone Scn, Ur: NOT DETECTED
Opiate, Ur Screen: NOT DETECTED
Phencyclidine (PCP) Ur S: NOT DETECTED
Tricyclic, Ur Screen: NOT DETECTED

## 2019-11-19 LAB — CBC WITH DIFFERENTIAL/PLATELET
Abs Immature Granulocytes: 0.12 10*3/uL — ABNORMAL HIGH (ref 0.00–0.07)
Basophils Absolute: 0 10*3/uL (ref 0.0–0.1)
Basophils Relative: 0 %
Eosinophils Absolute: 0.1 10*3/uL (ref 0.0–0.5)
Eosinophils Relative: 1 %
HCT: 38.6 % — ABNORMAL LOW (ref 39.0–52.0)
Hemoglobin: 13.4 g/dL (ref 13.0–17.0)
Immature Granulocytes: 1 %
Lymphocytes Relative: 10 %
Lymphs Abs: 1.1 10*3/uL (ref 0.7–4.0)
MCH: 30 pg (ref 26.0–34.0)
MCHC: 34.7 g/dL (ref 30.0–36.0)
MCV: 86.4 fL (ref 80.0–100.0)
Monocytes Absolute: 0.9 10*3/uL (ref 0.1–1.0)
Monocytes Relative: 8 %
Neutro Abs: 9.2 10*3/uL — ABNORMAL HIGH (ref 1.7–7.7)
Neutrophils Relative %: 80 %
Platelets: 161 10*3/uL (ref 150–400)
RBC: 4.47 MIL/uL (ref 4.22–5.81)
RDW: 15.1 % (ref 11.5–15.5)
WBC: 11.5 10*3/uL — ABNORMAL HIGH (ref 4.0–10.5)
nRBC: 0 % (ref 0.0–0.2)

## 2019-11-19 LAB — LIPID PANEL
Cholesterol: 141 mg/dL (ref 0–200)
HDL: 49 mg/dL (ref >40)
LDL Cholesterol: 87 mg/dL (ref 0–99)
Total CHOL/HDL Ratio: 2.9 RATIO
Triglycerides: 23 mg/dL (ref <150)
VLDL: 5 mg/dL (ref 0–40)

## 2019-11-19 LAB — ECHOCARDIOGRAM COMPLETE
Height: 70 in
S' Lateral: 3.57 cm
Weight: 2640 oz

## 2019-11-19 LAB — PROTIME-INR
INR: 1 (ref 0.8–1.2)
Prothrombin Time: 12.8 seconds (ref 11.4–15.2)

## 2019-11-19 LAB — APTT: aPTT: 27 seconds (ref 24–36)

## 2019-11-19 LAB — SARS CORONAVIRUS 2 BY RT PCR (HOSPITAL ORDER, PERFORMED IN ~~LOC~~ HOSPITAL LAB): SARS Coronavirus 2: NEGATIVE

## 2019-11-19 LAB — HEPARIN LEVEL (UNFRACTIONATED): Heparin Unfractionated: 0.34 IU/mL (ref 0.30–0.70)

## 2019-11-19 LAB — HIV ANTIBODY (ROUTINE TESTING W REFLEX): HIV Screen 4th Generation wRfx: NONREACTIVE

## 2019-11-19 IMAGING — CT CT HEAD W/O CM
3 series · 16 of 47 positions shown, 19 images · non-contrast
Comparison: Prior head CT examinations [DATE] and earlier

CLINICAL DATA: Seizure, nontraumatic. Altered mental status,
unclear cause.

EXAM:
CT HEAD WITHOUT CONTRAST
TECHNIQUE: Contiguous axial images were obtained from the base of the skull
through the vertex without intravenous contrast.

[Series 3: head wo · axial · 0.47mm/px · z∈[-96,+44]mm · 10 of 34 slices shown, 13 images]
[im 3/34  brain]
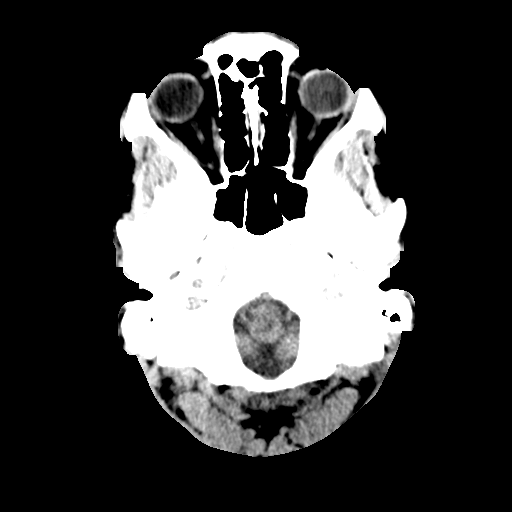
[im 3/34  bone]
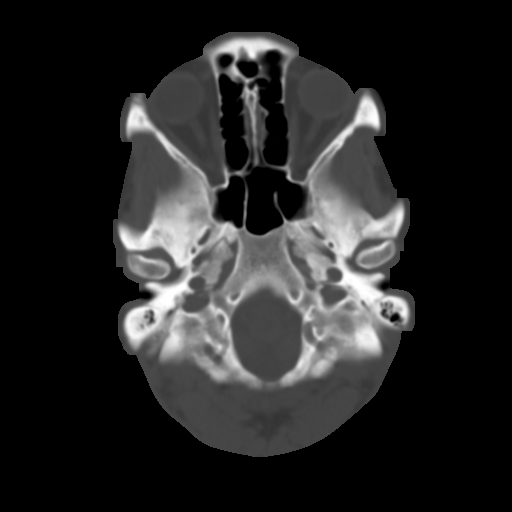
[im 6/34  brain]
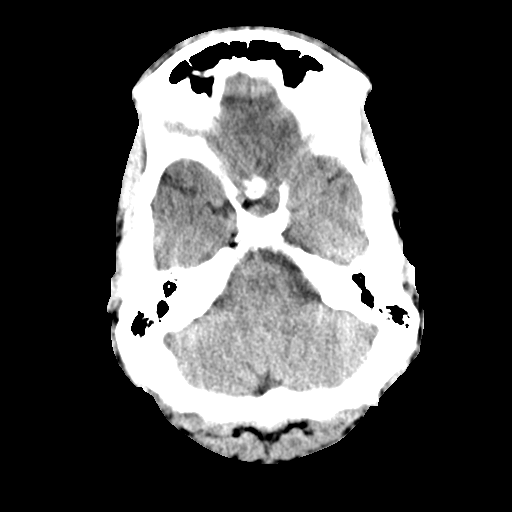
[im 10/34  brain]
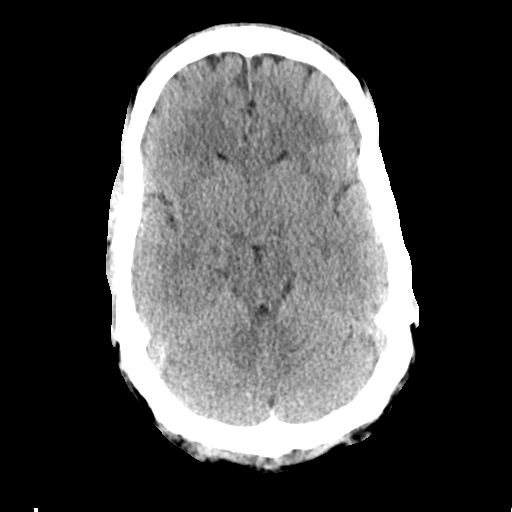
[im 12/34  brain]
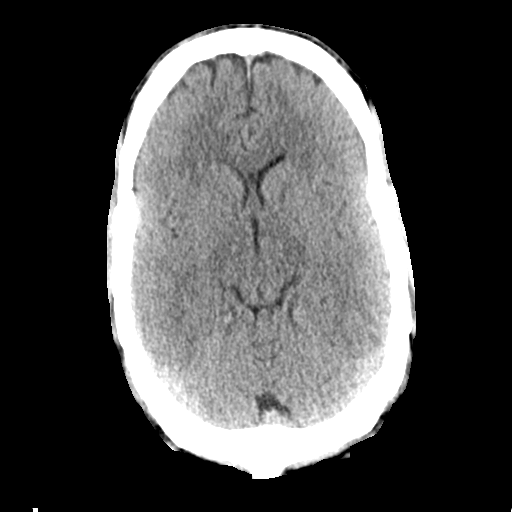
[im 15/34  brain]
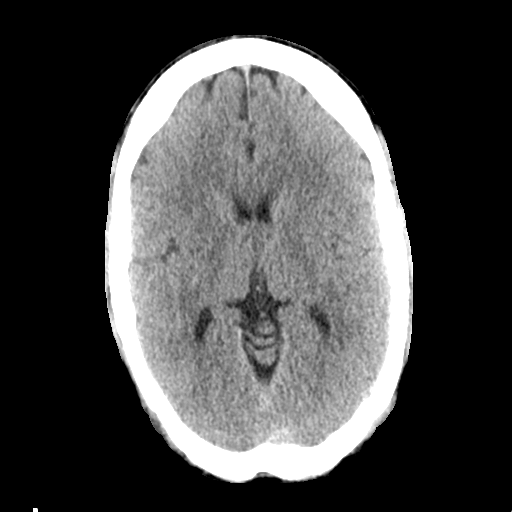
[im 15/34  bone]
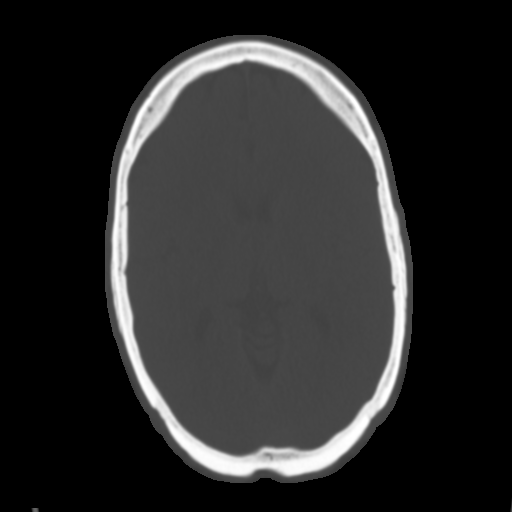
[im 19/34  brain]
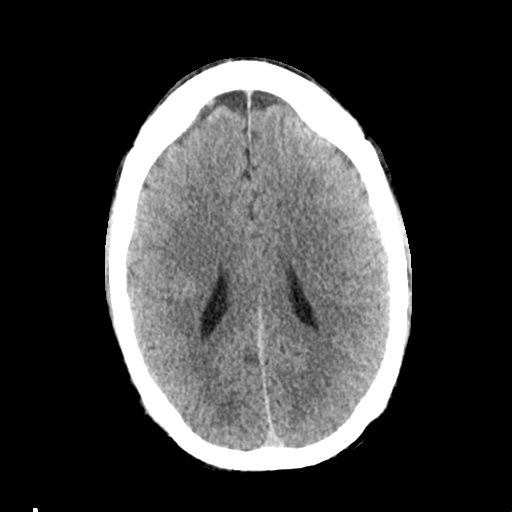
[im 22/34  brain]
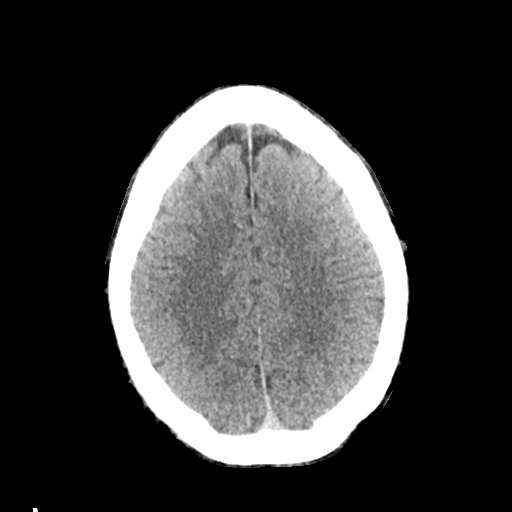
[im 26/34  brain]
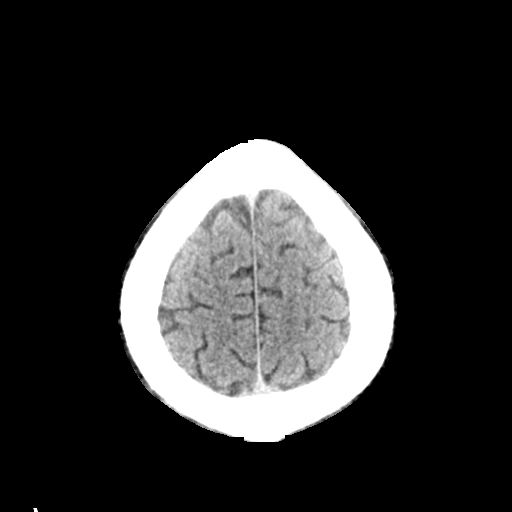
[im 28/34  brain]
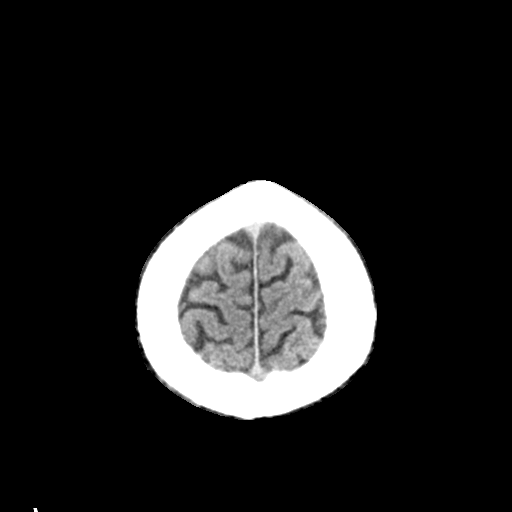
[im 28/34  bone]
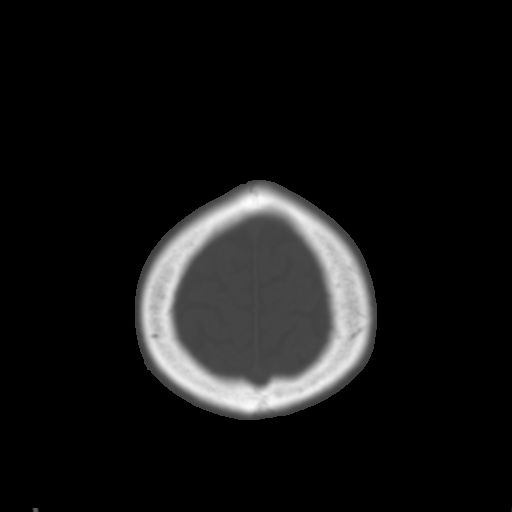
[im 31/34  brain]
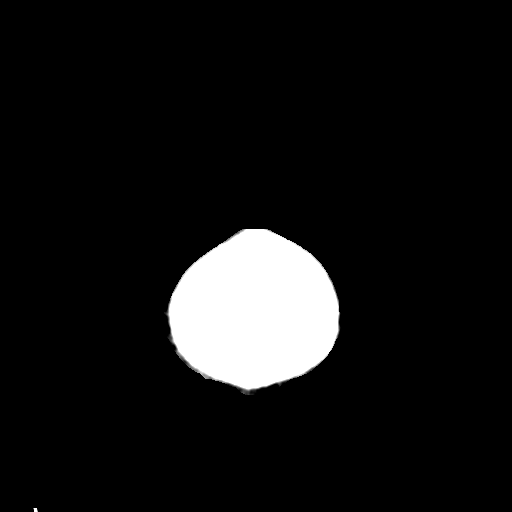

[Series 4: coronal soft tissue · coronal · 0.35mm/px · 3 of 75 slices shown]
[im 25/75  brain]
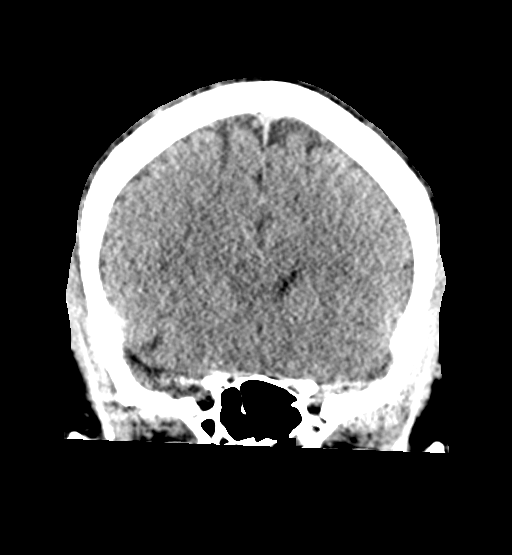
[im 33/75  brain]
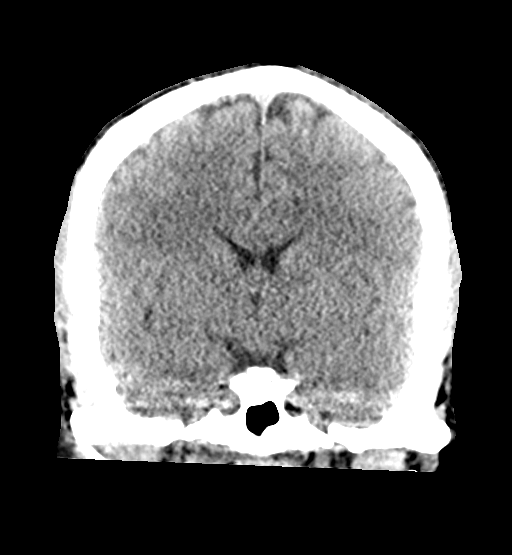
[im 42/75  brain]
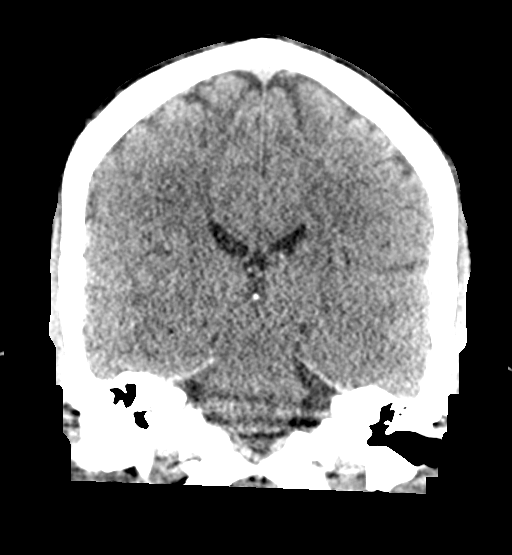

[Series 5: sagittal soft tissue · sagittal · 0.38mm/px · 3 of 55 slices shown]
[im 19/55  brain]
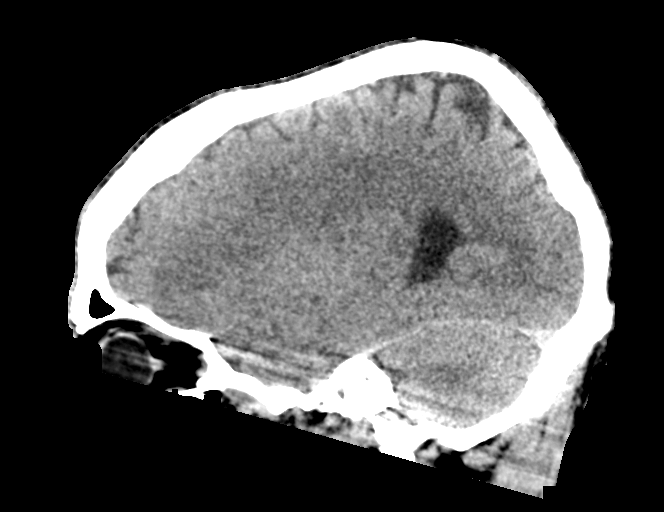
[im 28/55  brain]
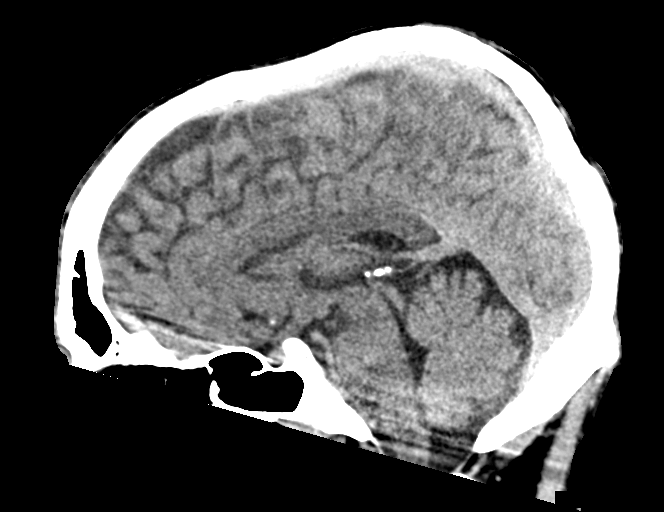
[im 37/55  brain]
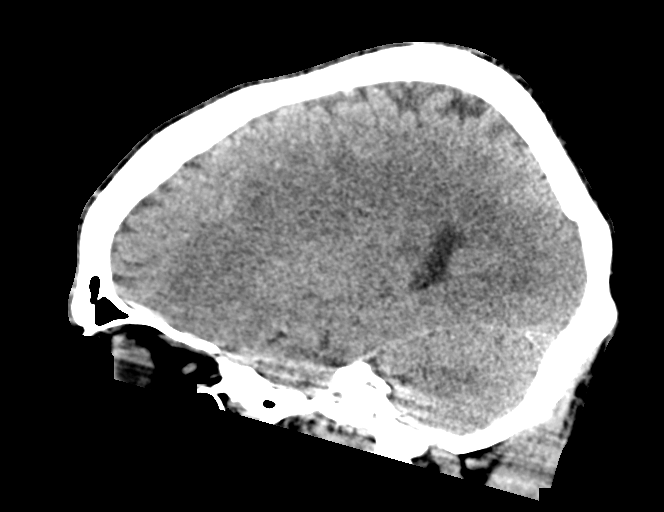

[16 of 47 positions shown; findings below may reference images not displayed]

FINDINGS: Brain:

Cerebral volume is normal.

There is no acute intracranial hemorrhage.

No demarcated cortical infarct.

No extra-axial fluid collection.

No evidence of intracranial mass.

No midline shift.

Vascular: No hyperdense vessel.

Skull: Normal. Negative for fracture or focal lesion.

Sinuses/Orbits: Visualized orbits show no acute finding. No
significant paranasal sinus disease or mastoid effusion at the
imaged levels.
IMPRESSION: Unremarkable non-contrast CT appearance of the brain. No evidence of
acute intracranial abnormality.

## 2019-11-19 IMAGING — DX DG CHEST 1V PORT
2 series · 2 of 2 positions shown · non-contrast
Comparison: None.

CLINICAL DATA: Unresponsive.  Drug overdose.

EXAM:
PORTABLE CHEST 1 VIEW

[chest ap (1 of 2)]
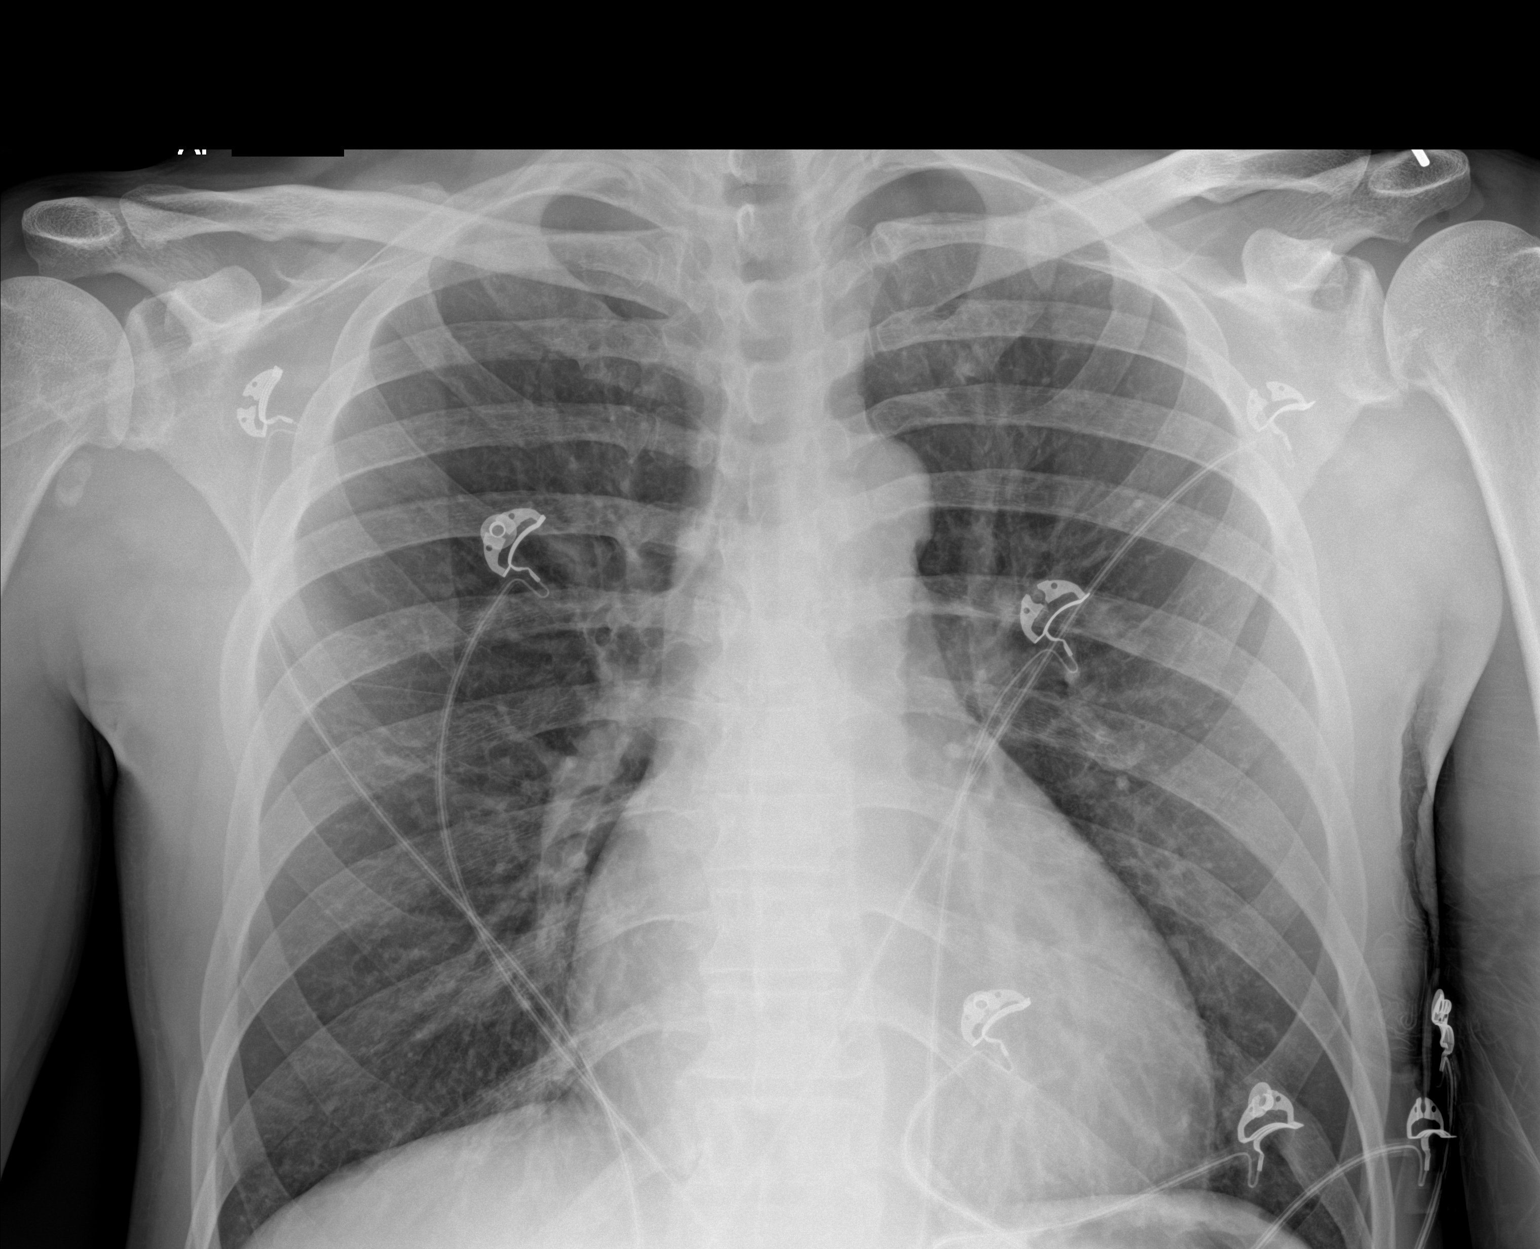

[chest ap (2 of 2)]
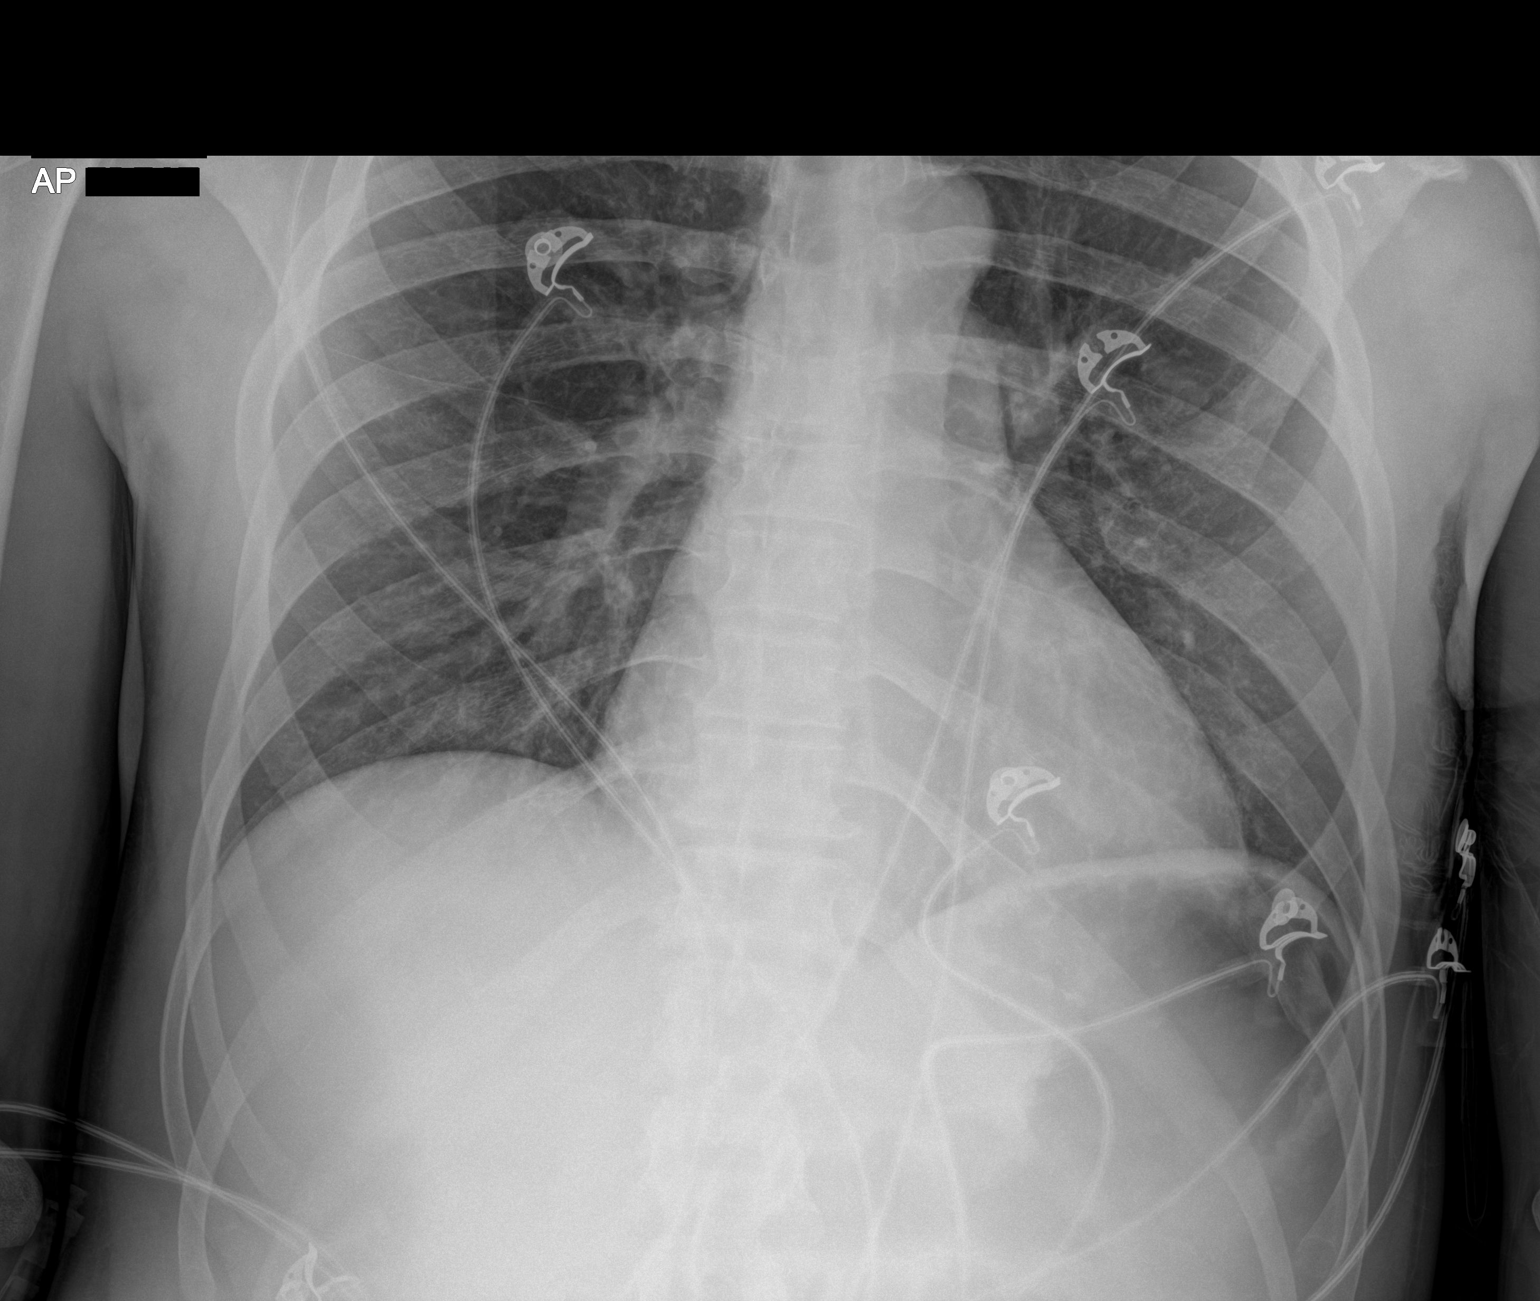

[2 of 2 positions shown; findings below may reference images not displayed]

FINDINGS: The cardiac silhouette is borderline to mildly enlarged. No
confluent airspace opacity, edema, pleural effusion, pneumothorax is
identified. No acute osseous abnormality is seen.
IMPRESSION: No active disease.

## 2019-11-19 MED ORDER — SODIUM CHLORIDE 0.9 % IV SOLN: INTRAVENOUS | Status: DC

## 2019-11-19 MED ORDER — NITROGLYCERIN 0.4 MG SL SUBL: 0.4000 mg | SUBLINGUAL_TABLET | SUBLINGUAL | Status: DC | PRN

## 2019-11-19 MED ORDER — ONDANSETRON HCL 4 MG/2ML IJ SOLN
4.0000 mg | Freq: Three times a day (TID) | INTRAMUSCULAR | Status: DC | PRN
  Administered 2019-11-19: 4 mg via INTRAVENOUS
  Filled 2019-11-19: qty 2

## 2019-11-19 MED ORDER — LEVETIRACETAM IN NACL 1500 MG/100ML IV SOLN
1500.0000 mg | Freq: Once | INTRAVENOUS | Status: AC
  Administered 2019-11-19: 1500 mg via INTRAVENOUS
  Filled 2019-11-19: qty 100

## 2019-11-19 MED ORDER — ATORVASTATIN CALCIUM 20 MG PO TABS
40.0000 mg | ORAL_TABLET | Freq: Every day | ORAL | Status: DC
  Administered 2019-11-20: 40 mg via ORAL
  Filled 2019-11-19: qty 2

## 2019-11-19 MED ORDER — HEPARIN BOLUS VIA INFUSION
4000.0000 [IU] | Freq: Once | INTRAVENOUS | Status: AC
  Administered 2019-11-19: 4000 [IU] via INTRAVENOUS
  Filled 2019-11-19: qty 4000

## 2019-11-19 MED ORDER — NICOTINE 21 MG/24HR TD PT24
21.0000 mg | MEDICATED_PATCH | Freq: Every day | TRANSDERMAL | Status: DC
  Administered 2019-11-19: 21 mg via TRANSDERMAL
  Filled 2019-11-19 (×2): qty 1

## 2019-11-19 MED ORDER — ASPIRIN 81 MG PO CHEW
324.0000 mg | CHEWABLE_TABLET | Freq: Once | ORAL | Status: AC
  Administered 2019-11-19: 324 mg via ORAL
  Filled 2019-11-19: qty 4

## 2019-11-19 MED ORDER — SODIUM CHLORIDE 0.9 % IV BOLUS
500.0000 mL | Freq: Once | INTRAVENOUS | Status: AC
  Administered 2019-11-19: 500 mL via INTRAVENOUS

## 2019-11-19 MED ORDER — HEPARIN SODIUM (PORCINE) 5000 UNIT/ML IJ SOLN: 4000.0000 [IU] | Freq: Once | INTRAMUSCULAR | Status: DC

## 2019-11-19 MED ORDER — HEPARIN (PORCINE) 25000 UT/250ML-% IV SOLN
1050.0000 [IU]/h | INTRAVENOUS | Status: DC
  Administered 2019-11-19: 900 [IU]/h via INTRAVENOUS
  Filled 2019-11-19 (×2): qty 250

## 2019-11-19 MED ORDER — ACETAMINOPHEN 325 MG PO TABS: 650.0000 mg | ORAL_TABLET | Freq: Four times a day (QID) | ORAL | Status: DC | PRN

## 2019-11-19 MED ORDER — LORAZEPAM 2 MG/ML IJ SOLN
1.0000 mg | INTRAMUSCULAR | Status: DC | PRN
  Administered 2019-11-19: 1 mg via INTRAVENOUS
  Filled 2019-11-19: qty 1

## 2019-11-19 MED ORDER — ASPIRIN 81 MG PO CHEW
324.0000 mg | CHEWABLE_TABLET | Freq: Every day | ORAL | Status: DC
  Administered 2019-11-20: 324 mg via ORAL
  Filled 2019-11-19: qty 4

## 2019-11-19 MED ORDER — LEVETIRACETAM IN NACL 1000 MG/100ML IV SOLN
1000.0000 mg | Freq: Two times a day (BID) | INTRAVENOUS | Status: DC
  Administered 2019-11-19 – 2019-11-20 (×2): 1000 mg via INTRAVENOUS
  Filled 2019-11-19 (×3): qty 100

## 2019-11-19 NOTE — Progress Notes (Signed)
Patient transferred to rm 260 from the ED. VSS and on rm air, no complaints of pain. Patient appears to be lethargic but able to answer all questions appropriately. Seizure pads and floor mats placed. Bed alarm on and call bell within place.

## 2019-11-19 NOTE — H&P (Signed)
History and Physical    Luis Hunter RSW:[id number] DOB: 1975-12-23 DOA: 11/19/2019  Referring MD/NP/PA:   PCP: Lorelee Market, MD   Patient coming from:  The patient is coming from home.  At baseline, pt is independent for most of ADL.        Chief Complaint: Unresponsiveness  HPI: Luis Hunter is a 44 y.o. male with medical history significant of polysubstance abuse, seizure, depression, bipolar disorder, schizophrenia, tobacco abuse, BPPV, who presents with unresponsiveness.  Per report, patient was brought to the ED due to unresponsiveness after suspected drug overdose. Reportedly, the patient and his girlfriend had obtained cocaine from a new source that they did not know very well.  After using, they both became unresponsive.  EMS report that both were resuscitated with 2 mg of Narcan each.  This patient also seemed to have some seizure activity for which they gave 4 mg of Versed. When I saw pt in ED, she is still confused, but is arousable.  He knows his own name, but is not orientated to the place and time. He moves all extremities.  No facial droop or slurred speech.  He denies chest pain or abdominal pain.  No active cough, respiratory distress, nausea vomiting, diarrhea noted.  Initially patient was found to have elevated troponin 217.  EKG showed ST elevation in inferior leads and V4-V6.  Dr. Saralyn Pilar of cardiology is consulted. Per Dr. Saralyn Pilar, his EKG is similar to ECG from 01/12/2019, unlikely STEMI and defered code STEMI.  ED Course: pt was found to have trop 217, WBC 11.5, pending COVID-19 PCR, electrolytes renal function okay, temperature normal, blood pressure 123/89, heart rate 82, 94, RR 14, oxygen saturation 96% on room air currently, chest x-ray negative.  CT head negative.  Patient is placed on progressive bed for observation, cardiology, Dr. Rockey Situ is consulted.  Review of Systems: Could not be accurately reviewed since patient still confused  Allergy: No Known  Allergies  Past Medical History:  Diagnosis Date  . Bipolar disorder ([place])   . Depression   . Schizophrenia ([place])   . Seizures ([place])     Past Surgical History:  Procedure Laterality Date  . FINGER SURGERY      Social History:  reports that he has been smoking. He has never used smokeless tobacco. He reports current drug use. Drugs: Marijuana and Cocaine. He reports that he does not drink alcohol.  Family History: No family history on file.  Could not be reviewed since patient is still confused.  Prior to Admission medications   Medication Sig Start Date End Date Taking? Authorizing Provider  ARIPiprazole ER (ABILIFY MAINTENA) 400 MG PRSY prefilled syringe Inject 400 mg as directed every 30 (thirty) days.  11/08/15  Yes [provider]  levETIRAcetam (KEPPRA) 1000 MG tablet Take 1 tablet (1,000 mg total) by mouth 2 (two) times daily. 06/29/19  Yes [name], MD    Physical Exam: Vitals:   11/19/19 0922 11/19/19 0930 11/19/19 1030 11/19/19 1230  BP:  118/80 123/89 117/83  Pulse:  64 (!) 52 (!) 54  Resp:  15 14 12   Temp: 97.6 F (36.4 C)     TempSrc: Axillary     SpO2:  100% 100% 100%  Weight:      Height:       General: Not in acute distress HEENT:       Eyes: PERRL, EOMI, no scleral icterus.       ENT: No discharge from the ears  and nose, no pharynx injection, no tonsillar enlargement.        Neck: No JVD, no bruit, no mass felt. Heme: No neck lymph node enlargement. Cardiac: S1/S2, RRR, No murmurs, No gallops or rubs. Respiratory: No rales, wheezing, rhonchi or rubs. GI: Soft, nondistended, nontender, no organomegaly, BS present. GU: No hematuria Ext: No pitting leg edema bilaterally. 2+DP/PT pulse bilaterally. Musculoskeletal: No joint deformities, No joint redness or warmth, no limitation of ROM in spin. Skin: No rashes.  Neuro: confused, but is arousable.  He knows his own name, but is not orientated to the place and time. Cranial nerves II-XII  grossly intact, moves all extremities. Psych: Patient is not psychotic Labs on Admission: I have personally reviewed following labs and imaging studies  CBC: Recent Labs  Lab 11/19/19 0916  WBC 11.5*  NEUTROABS 9.2*  HGB 13.4  HCT 38.6*  MCV 86.4  PLT 631   Basic Metabolic Panel: Recent Labs  Lab 11/19/19 0916  NA 140  K 3.5  CL 112*  CO2 19*  GLUCOSE 113*  BUN 15  CREATININE 1.10  CALCIUM 7.9*   GFR: Estimated Creatinine Clearance: 87.6 mL/min (by C-G formula based on SCr of 1.1 mg/dL). Liver Function Tests: Recent Labs  Lab 11/19/19 0916  AST 28  ALT 22  ALKPHOS 46  BILITOT 0.7  PROT 6.2*  ALBUMIN 3.7   No results for input(s): LIPASE, AMYLASE in the last 168 hours. No results for input(s): AMMONIA in the last 168 hours. Coagulation Profile: Recent Labs  Lab 11/19/19 1054  INR 1.0   Cardiac Enzymes: No results for input(s): CKTOTAL, CKMB, CKMBINDEX, TROPONINI in the last 168 hours. BNP (last 3 results) No results for input(s): PROBNP in the last 8760 hours. HbA1C: No results for input(s): HGBA1C in the last 72 hours. CBG: No results for input(s): GLUCAP in the last 168 hours. Lipid Profile: No results for input(s): CHOL, HDL, LDLCALC, TRIG, CHOLHDL, LDLDIRECT in the last 72 hours. Thyroid Function Tests: No results for input(s): TSH, T4TOTAL, FREET4, T3FREE, THYROIDAB in the last 72 hours. Anemia Panel: No results for input(s): VITAMINB12, FOLATE, FERRITIN, TIBC, IRON, RETICCTPCT in the last 72 hours. Urine analysis:    Component Value Date/Time   COLORURINE YELLOW (A) 06/06/2017 0523   APPEARANCEUR HAZY (A) 06/06/2017 0523   APPEARANCEUR Hazy 03/12/2014 1249   LABSPEC 1.013 06/06/2017 0523   LABSPEC 1.026 03/12/2014 1249   PHURINE 5.0 06/06/2017 0523   GLUCOSEU NEGATIVE 06/06/2017 0523   GLUCOSEU Negative 03/12/2014 1249   HGBUR NEGATIVE 06/06/2017 0523   BILIRUBINUR NEGATIVE 06/06/2017 0523   BILIRUBINUR Negative 03/12/2014 1249    KETONESUR NEGATIVE 06/06/2017 0523   PROTEINUR NEGATIVE 06/06/2017 0523   NITRITE NEGATIVE 06/06/2017 0523   LEUKOCYTESUR NEGATIVE 06/06/2017 0523   LEUKOCYTESUR Negative 03/12/2014 1249   Sepsis Labs: @LABRCNTIP (procalcitonin:4,lacticidven:4) ) Recent Results (from the past 240 hour(s))  SARS Coronavirus 2 by RT PCR (hospital order, performed in [facility] hospital lab) Nasopharyngeal Nasopharyngeal Swab     Status: None   Collection Time: 11/19/19 10:33 AM   Specimen: Nasopharyngeal Swab  Result Value Ref Range Status   SARS Coronavirus 2 NEGATIVE NEGATIVE Final    Comment: (NOTE) SARS-CoV-2 target nucleic acids are NOT DETECTED.  The SARS-CoV-2 RNA is generally detectable in upper and lower respiratory specimens during the acute phase of infection. The lowest concentration of SARS-CoV-2 viral copies this assay can detect is 250 copies / mL. A negative result does not preclude SARS-CoV-2 infection and should  not be used as the sole basis for treatment or other patient management decisions.  A negative result may occur with improper specimen collection / handling, submission of specimen other than nasopharyngeal swab, presence of viral mutation(s) within the areas targeted by this assay, and inadequate number of viral copies (<250 copies / mL). A negative result must be combined with clinical observations, patient history, and epidemiological information.  Fact Sheet for Patients:   StrictlyIdeas.no  Fact Sheet for Healthcare Providers: BankingDealers.co.za  This test is not yet approved or  cleared by the [place] FDA and has been authorized for detection and/or diagnosis of SARS-CoV-2 by FDA under an Emergency Use Authorization (EUA).  This EUA will remain in effect (meaning this test can be used) for the duration of the COVID-19 declaration under Section 564(b)(1) of the Act, 21 U.S.C. section 360bbb-3(b)(1), unless the  authorization is terminated or revoked sooner.  Performed at [facility], [address] [place], [place] [address]      Radiological Exams on Admission: CT HEAD WO CONTRAST  Result Date: 11/19/2019 CLINICAL DATA:  Seizure, nontraumatic. Altered mental status, unclear cause. EXAM: CT HEAD WITHOUT CONTRAST TECHNIQUE: Contiguous axial images were obtained from the base of the skull through the vertex without intravenous contrast. COMPARISON:  Prior head CT examinations 08/20/2017 and earlier FINDINGS: Brain: Cerebral volume is normal. There is no acute intracranial hemorrhage. No demarcated cortical infarct. No extra-axial fluid collection. No evidence of intracranial mass. No midline shift. Vascular: No hyperdense vessel. Skull: Normal. Negative for fracture or focal lesion. Sinuses/Orbits: Visualized orbits show no acute finding. No significant paranasal sinus disease or mastoid effusion at the imaged levels. IMPRESSION: Unremarkable non-contrast CT appearance of the brain. No evidence of acute intracranial abnormality. Electronically Signed   By: Kellie Simmering DO   On: 11/19/2019 10:05   DG Chest Portable 1 View  Result Date: 11/19/2019 CLINICAL DATA:  Unresponsive.  Drug overdose. EXAM: PORTABLE CHEST 1 VIEW COMPARISON:  None. FINDINGS: The cardiac silhouette is borderline to mildly enlarged. No confluent airspace opacity, edema, pleural effusion, pneumothorax is identified. No acute osseous abnormality is seen. IMPRESSION: No active disease. Electronically Signed   By: Logan Bores M.D.   On: 11/19/2019 09:39     EKG: Independently reviewed.  Sinus rhythm, QTC 455, ST elevation in inferior leads and V4-V6  Assessment/Plan Principal Problem:   Unresponsiveness Active Problems:   Seizure (HCC)   Schizophrenia (HCC)   Bipolar disorder (HCC)   Elevated troponin   Tobacco abuse   Leukocytosis   Polysubstance abuse (HCC)   Unresponsiveness: Likely due to polysubstance abuse,  including cocaine.  CT head negative.  No focal neuro deficit on physical examination.  Airway is protected.  -Placed on progressive bed for observation -Frequent neuro check -Follow-up UDS -Keep patient n.p.o. until mental status improves  Elevated troponin: trop 217 -->8.  Likely due to cocaine overdose induced acute myocardial injury.  Patient denies chest pain.  Patient has ST elevation on EKG, which is similar to previous EKG.  STEMI is deferred by cardiologist.  Dr. Rockey Situ of cardiology is then consulted. - Trend Trop - Repeat EKG in the am  - prn Nitroglycerin,  - start aspirin, lipitor  - Risk factor stratification: will check FLP and A1C  - check UDS - 2d echo  Seizure ([place]) -Seizure precaution -When necessary Ativan for seizure -Continue Home medications: keppra -->will give by IV 1000 mg bid (pt was loaded with 1.5 g of Keppra in ED)  Schizophrenia ([place]) and bipolar disorder  -pt is getting Abilify injection 400 mg every month  Tobacco abuse and polysubstance abuse  -nicotine patch -need counseling when patient's mental status improves -Follow-up UDS  Leukocytosis: WBC 11.5.  No fever.  No signs of infection.  Likely reactive. -Follow-up with CBC     DVT ppx: on IV Heparin   Code Status: Full code Family Communication: not done, no family member is at bed side. I have tried to call his sister without success. Disposition Plan:  Anticipate discharge back to previous environment Consults called:  Dr. Rockey Situ of card Admission status:  progressive unit for obs    Status is: Observation  The patient remains OBS appropriate and will d/c before 2 midnights.  Dispo: The patient is from: Home              Anticipated d/c is to: Home              Anticipated d/c date is: 1 day              Patient currently is not medically stable to d/c.           Date of Service 11/19/2019    Ivor [facility]   If 7PM-7AM, please contact  night-coverage [email or web address] 11/19/2019, 12:59 PM

## 2019-11-19 NOTE — ED Notes (Signed)
MD Blaine Hamper notified pt's repeat troponin is 8.

## 2019-11-19 NOTE — ED Triage Notes (Signed)
Pt comes into the ED via EMS found unresponsive, reports from another pt brought in with same complaint, they had got cocaine from a new saler and pt went unresponsive and had seizure like activity with a hx of seizure, initially 96/54, sats 72%RA, pt was given versed and narcan on seen ,. Pt is arrousable on arrival.

## 2019-11-19 NOTE — Progress Notes (Signed)
*  PRELIMINARY RESULTS* Echocardiogram 2D Echocardiogram has been performed.  Sherrie Sport 11/19/2019, 6:34 PM

## 2019-11-19 NOTE — Consult Note (Signed)
Cardiology Consultation:   Patient ID: Luis Hunter Luis Hunter; DOB: 04-10-1976  Admit date: 11/19/2019 Date of Consult: 11/19/2019  Primary Care Provider: Lorelee Market, MD Primary Cardiologist:[facility], Dr. Rockey Situ rounding Primary Electrophysiologist:  None    Patient Profile:   Luis Hunter is a 44 y.o. male with a hx of seizures, schizophrenia, and no previously known history of heart dz who is being seen today for the evaluation of elevated HS Tn in the setting of suspected drug overdose at the request of Dr. Blaine Hamper.  History of Present Illness:   Luis Hunter is a 44 yo male with PMH as above and no previously known history of heart dz and known history of schizophrenia and seizures.   Patient is sedated at this time and will not wake with shaking or in response to his name. The below HPI is obtained via chart bx.  On 11/19/19, he presented to [facility] ED with suspected drug overdose. No reported CP. Per review of chart, he and his girlfriend obtained cocaine from a new source that they did not know well.  After using this drug, they both became unresponsive.  EMS arrived at the scene and reported they were both resuscitated with 2 mg of Narcan each.  Luis Hunter also appeared to have some seizure-like activity, for which they gave 4 mg of Versed.   They arrived to [facility] ED.  At that time, he denied any chest pain or SOB. Reported feeling cold.  STEMI team consulted with note this morning at [facility] stating STEMI code deferred as EKG showed ST elevation in the inferior and lateral leads thought most consistent with early repolarization due to LVH and also similar to EKG 01/12/2019.  It was noted HS Tn mildly elevated likely secondary to drug use, possibly due to seizure activity.   At presentation, vitals stable with BP 127/85 and HR 94bpm. Labs significant for hypokalemia with K 3.5, Cr 1.10, BUN 15, HS Tn peaked at 217 and now down-trending. CT head unremarkable. CXR without active  dz.  After code STEMI deferred, [facility] cardiology consulted.  Patient remains asleep / sedated on consultation.   Heart Pathway Score:     Past Medical History:  Diagnosis Date   Bipolar disorder ([place])    Depression    Schizophrenia ([place])    Seizures ([place])     Past Surgical History:  Procedure Laterality Date   FINGER SURGERY       Home Medications:  Prior to Admission medications   Medication Sig Start Date End Date Taking? Authorizing Provider  ARIPiprazole ER (ABILIFY MAINTENA) 400 MG PRSY prefilled syringe Inject 400 mg as directed every 30 (thirty) days.  11/08/15  Yes [provider]  levETIRAcetam (KEPPRA) 1000 MG tablet Take 1 tablet (1,000 mg total) by mouth 2 (two) times daily. 06/29/19  Yes [name], MD    Inpatient Medications: Scheduled Meds:  [START ON 11/20/2019] aspirin  324 mg Oral Daily   nicotine  21 mg Transdermal Daily   Continuous Infusions:  sodium chloride 75 mL/hr at 11/19/19 1149   heparin 900 Units/hr (11/19/19 1159)   PRN Meds: acetaminophen, LORazepam, nitroGLYCERIN, ondansetron (ZOFRAN) IV  Allergies:   No Known Allergies  Social History:   Social History   Socioeconomic History   Marital status: Single    Spouse name: Not on file   Number of children: Not on file   Years of education: Not on file   Highest education level: Not on file  Occupational History   Not on file  Tobacco Use   Smoking status: Current Every Day Smoker   Smokeless tobacco: Never Used  Substance and Sexual Activity   Alcohol use: No    Alcohol/week: 0.0 standard drinks   Drug use: Yes    Types: Marijuana, Cocaine   Sexual activity: Not Currently  Other Topics Concern   Not on file  Social History Narrative   Not on file   Social Determinants of Health   Financial Resource Strain:    Difficulty of Paying Living Expenses:   Food Insecurity:    Worried About Charity fundraiser in the Last Year:    Youth worker in the Last Year:   Transportation Needs:    Film/video editor (Medical):    Lack of Transportation (Non-Medical):   Physical Activity:    Days of Exercise per Week:    Minutes of Exercise per Session:   Stress:    Feeling of Stress :   Social Connections:    Frequency of Communication with Friends and Family:    Frequency of Social Gatherings with Friends and Family:    Attends Religious Services:    Active Member of Clubs or Organizations:    Attends Music therapist:    Marital Status:   Intimate Partner Violence:    Fear of Current or Ex-Partner:    Emotionally Abused:    Physically Abused:    Sexually Abused:     Family History:   No family history on file.  No known family history of cardiac dz. Patient cannot provide history. Chart bx without family history of heart dz.  ROS:  Please see the history of present illness.  Review of Systems  Unable to perform ROS: Other   Patient sedated and asleep. All other ROS reviewed and negative.     Physical Exam/Data:   Vitals:   11/19/19 0922 11/19/19 0930 11/19/19 1030 11/19/19 1230  BP:  118/80 123/89 117/83  Pulse:  64 (!) 52 (!) 54  Resp:  15 14 12   Temp: 97.6 F (36.4 C)     TempSrc: Axillary     SpO2:  100% 100% 100%  Weight:      Height:       No intake or output data in the 24 hours ending 11/19/19 1258 Last 3 Weights 11/19/2019 06/29/2019 01/12/2019  Weight (lbs) 165 lb 171 lb 12.8 oz 200 lb  Weight (kg) 74.844 kg 77.928 kg 90.719 kg     Body mass index is 23.68 kg/m.  General:  Well nourished, well developed, in no acute distress. Sedated, asleep. HEENT: normal Lymph: no adenopathy Neck: no JVD Endocrine:  No thryomegaly Vascular: No carotid bruits; FA pulses 2+ bilaterally without bruits  Cardiac:  normal S1, S2; regular and bradycardic; no murmur  Lungs: coarse breath sounds bilaterally  Abd: soft, nontender, no hepatomegaly  Ext: no b/l  edema Musculoskeletal:  No deformities, BUE and BLE strength normal and equal Skin: warm and dry  Neuro:  Asleep / sedated Psych:   Asleep / sedated  EKG:  The EKG was personally reviewed and demonstrates:  EKG showing LVH and repolarization change in the inferolateral leads with prolonged QTc.  Similar to EKG 01/12/2019. Telemetry:  Telemetry was personally reviewed and demonstrates:  NSR, SB 50-90s  Relevant CV Studies: Pending echo  Laboratory Data:  High Sensitivity Troponin:   Recent Labs  Lab 11/19/19 0916 11/19/19 1145  TROPONINIHS 217* 8  Chemistry Recent Labs  Lab 11/19/19 0916  NA 140  K 3.5  CL 112*  CO2 19*  GLUCOSE 113*  BUN 15  CREATININE 1.10  CALCIUM 7.9*  GFRNONAA >60  GFRAA >60  ANIONGAP 9    Recent Labs  Lab 11/19/19 0916  PROT 6.2*  ALBUMIN 3.7  AST 28  ALT 22  ALKPHOS 46  BILITOT 0.7   Hematology Recent Labs  Lab 11/19/19 0916  WBC 11.5*  RBC 4.47  HGB 13.4  HCT 38.6*  MCV 86.4  MCH 30.0  MCHC 34.7  RDW 15.1  PLT 161   BNPNo results for input(s): BNP, PROBNP in the last 168 hours.  DDimer No results for input(s): DDIMER in the last 168 hours.   Radiology/Studies:  CT HEAD WO CONTRAST  Result Date: 11/19/2019 CLINICAL DATA:  Seizure, nontraumatic. Altered mental status, unclear cause. EXAM: CT HEAD WITHOUT CONTRAST TECHNIQUE: Contiguous axial images were obtained from the base of the skull through the vertex without intravenous contrast. COMPARISON:  Prior head CT examinations 08/20/2017 and earlier FINDINGS: Brain: Cerebral volume is normal. There is no acute intracranial hemorrhage. No demarcated cortical infarct. No extra-axial fluid collection. No evidence of intracranial mass. No midline shift. Vascular: No hyperdense vessel. Skull: Normal. Negative for fracture or focal lesion. Sinuses/Orbits: Visualized orbits show no acute finding. No significant paranasal sinus disease or mastoid effusion at the imaged levels.  IMPRESSION: Unremarkable non-contrast CT appearance of the brain. No evidence of acute intracranial abnormality. Electronically Signed   By: Kellie Simmering DO   On: 11/19/2019 10:05   DG Chest Portable 1 View  Result Date: 11/19/2019 CLINICAL DATA:  Unresponsive.  Drug overdose. EXAM: PORTABLE CHEST 1 VIEW COMPARISON:  None. FINDINGS: The cardiac silhouette is borderline to mildly enlarged. No confluent airspace opacity, edema, pleural effusion, pneumothorax is identified. No acute osseous abnormality is seen. IMPRESSION: No active disease. Electronically Signed   By: Logan Bores M.D.   On: 11/19/2019 09:39   {   Assessment and Plan:   Drug overdose --Suspected as cocaine overdose; however, drug screen negative for cocaine and positive for benzodiazepines and marijuana.  No cocaine detected.  As above, unable to get HPI from patient as he is sedated on exam and will not respond to voice or sternal rub.  Elevated high-sensitivity troponin Likely supply demand ischemia --Per review of EMR, no reported chest pain.  No previously known history of heart dz. HS Tn peaked at 217, now down-trending.  EKG showing LVH and repolarization change in the inferolateral leads with prolonged QTc. Suspect supply demand ischemia in the setting of drug use and possible seizure activity.  Also considered is vasospasm secondary to drug use, though UA negative for cocaine. Will obtain echo. If EF reduced or acute structural changes, further workup recommended. No indication for emergent ischemic workup at this time. Complete cessation of drugs advised.   For questions or updates, please contact [place] Please consult [email or web address] for contact info under     Signed, Arvil Chaco, PA-C  11/19/2019 12:58 PM

## 2019-11-19 NOTE — Progress Notes (Signed)
ANTICOAGULATION CONSULT NOTE - Initial Consult  Pharmacy Consult for heparin Indication: chest pain/ACS  No Known Allergies  Patient Measurements: Height: 5\' 10"  (177.8 cm) Weight: 74.8 kg (165 lb) IBW/kg (Calculated) : 73 Heparin Dosing Weight: 74.8 kg   Vital Signs: Temp: 97.6 F (36.4 C) (07/16 0922) Temp Source: Axillary (07/16 0922) BP: 123/89 (07/16 1030) Pulse Rate: 52 (07/16 1030)  Labs: Recent Labs    11/19/19 0916  HGB 13.4  HCT 38.6*  PLT 161  CREATININE 1.10  TROPONINIHS 217*    Estimated Creatinine Clearance: 87.6 mL/min (by C-G formula based on SCr of 1.1 mg/dL).   Medical History: Past Medical History:  Diagnosis Date   Bipolar disorder ([place])    Depression    Schizophrenia ([place])    Seizures ([place])      Assessment: 44 yo male found unresponsive, now with elevated troponin. No oral anticoagulants noted on PTA med list. INR in process. Per ED RN, IV heparin not given yet, will bolus via drip.   Goal of Therapy:  Heparin level 0.3-0.7 units/ml Monitor platelets by anticoagulation protocol: Yes   Plan:  Add on aPTT Heparin bolus 4000 units IV x1 then heparin drip at 900 units/hr (=9 ml/hr) Heparin level in 6h after start of drip  CBC in AM   Rayna Sexton L 11/19/2019,11:15 AM

## 2019-11-19 NOTE — Progress Notes (Signed)
[place] for heparin Indication: chest pain/ACS  No Known Allergies  Patient Measurements: Height: 6' (182.9 cm) Weight: 84.6 kg (186 lb 9.6 oz) IBW/kg (Calculated) : 77.6 Heparin Dosing Weight: 74.8 kg   Vital Signs: Temp: 97.6 F (36.4 C) (07/16 1911) Temp Source: Oral (07/16 1911) BP: 128/98 (07/16 1911) Pulse Rate: 57 (07/16 1911)  Labs: Recent Labs    11/19/19 0916 11/19/19 0916 11/19/19 1054 11/19/19 1145 11/19/19 1605 11/19/19 1837  HGB 13.4  --   --   --   --   --   HCT 38.6*  --   --   --   --   --   PLT 161  --   --   --   --   --   APTT  --   --  27  --   --   --   LABPROT  --   --  12.8  --   --   --   INR  --   --  1.0  --   --   --   HEPARINUNFRC  --   --   --   --   --  0.34  CREATININE 1.10  --   --   --   --   --   TROPONINIHS 217*   < >  --  8 12 13    < > = values in this interval not displayed.    Estimated Creatinine Clearance: 93.1 mL/min (by C-G formula based on SCr of 1.1 mg/dL).   Medical History: Past Medical History:  Diagnosis Date  . Bipolar disorder ([employer])   . Depression   . Schizophrenia ([place])   . Seizures [facility])      Assessment: 44 yo male found unresponsive, now with elevated troponin. No oral anticoagulants noted on PTA med list.  Goal of Therapy:  Heparin level 0.3-0.7 units/ml Monitor platelets by anticoagulation protocol: Yes   Plan:  7/16 1837 HL 0.34 therapeutic. Continue heparin drip at 900 units/hr. Recheck HL 7/17 at 0100 to confirm. CBC daily.   Tawnya Crook, PharmD 11/19/2019,7:27 PM

## 2019-11-19 NOTE — ED Provider Notes (Signed)
[facility] Emergency Department Provider Note  ____________________________________________  Time seen: Approximately 9:44 AM  I have reviewed the triage vital signs and the nursing notes.   HISTORY  Chief Complaint Drug Overdose    Level 5 Caveat: Portions of the History and Physical including HPI and review of systems are unable to be completely obtained due to patient being a poor historian   HPI Luis Hunter is a 44 y.o. male with a history of bipolar disorder schizophrenia seizures who was brought to the ED after suspected drug overdose.  EMS obtained additional history from the patient's girlfriend on scene who arrived simultaneously to the ED with suspected overdose as well .  Reportedly, the patient and his girlfriend had obtained cocaine from a new source that they did not know very well.  After using, they both became unresponsive.  EMS report that both were resuscitated with 2 mg of Narcan each.  This patient also seemed to have some seizure activity for which they gave 4 mg of Versed.  Patient denies any complaints currently except for feeling cold.  Denies chest pain or shortness of breath.    Past Medical History:  Diagnosis Date  . Bipolar disorder ([place])   . Depression   . Schizophrenia ([place])   . Seizures [facility])      Patient Active Problem List   Diagnosis Date Noted  . Schizophrenia ([place])   . Bipolar disorder ([place])   . Elevated troponin   . Unresponsiveness   . Tobacco abuse   . Leukocytosis   . Seizure ([place]) 06/05/2017  . BPPV (benign paroxysmal positional vertigo) 11/16/2015  . Dizziness 11/16/2015     Past Surgical History:  Procedure Laterality Date  . FINGER SURGERY       Prior to Admission medications   Medication Sig Start Date End Date Taking? Authorizing Provider  ARIPiprazole ER (ABILIFY MAINTENA) 400 MG PRSY prefilled syringe Inject 400 mg as directed every 30 (thirty) days.  11/08/15  Yes [provider]  levETIRAcetam (KEPPRA) 1000 MG tablet Take 1 tablet (1,000 mg total) by mouth 2 (two) times daily. 06/29/19  Yes [name], MD     Allergies Patient has no known allergies.   No family history on file.  Social History Social History   Tobacco Use  . Smoking status: Current Every Day Smoker  . Smokeless tobacco: Never Used  Substance Use Topics  . Alcohol use: No    Alcohol/week: 0.0 standard drinks  . Drug use: Yes    Types: Marijuana, Cocaine    Review of Systems Level 5 Caveat: Portions of the History and Physical including HPI and review of systems are unable to be completely obtained due to patient being a poor historian   Constitutional:   No known fever.  ENT:   No rhinorrhea. Cardiovascular:   No chest pain or syncope. Respiratory:   No dyspnea or cough. Gastrointestinal:   Negative for abdominal pain, vomiting and diarrhea.  Musculoskeletal:   Negative for focal pain or swelling ____________________________________________   PHYSICAL EXAM:  VITAL SIGNS: ED Triage Vitals  Enc Vitals Group     BP 11/19/19 0900 127/85     Pulse Rate 11/19/19 0900 94     Resp 11/19/19 0900 16     Temp 11/19/19 0922 97.6 F (36.4 C)     Temp Source 11/19/19 0903 Oral     SpO2 11/19/19 0900 97 %     Weight 11/19/19 0904 165 lb (  74.8 kg)     Height 11/19/19 0904 5\' 10"  (1.778 m)     Head Circumference --      Peak Flow --      Pain Score 11/19/19 0904 Asleep     Pain Loc --      Pain Edu? --      Excl. in [place]? --     Vital signs reviewed, nursing assessments reviewed.   Constitutional:   Somnolent but arousable. Non-toxic appearance.  Shivering without focal convulsive activity and I am peeing Eyes:   Conjunctivae are normal. EOMI. PERRL, 2 mm bilaterally. ENT      Head:   Normocephalic and atraumatic.      Nose:   No congestion/rhinnorhea.       Mouth/Throat:   MMM, no pharyngeal erythema. No peritonsillar mass.       Neck:   No meningismus. Full  ROM. Hematological/Lymphatic/Immunilogical:   No cervical lymphadenopathy. Cardiovascular:   RRR. Symmetric bilateral radial and DP pulses.  No murmurs. Cap refill less than 2 seconds. Respiratory:   Normal respiratory effort without tachypnea/retractions. Breath sounds are clear and equal bilaterally. No wheezes/rales/rhonchi. Gastrointestinal:   Soft and nontender. Non distended. There is no CVA tenderness.  No rebound, rigidity, or guarding. Musculoskeletal:   Normal range of motion in all extremities. No joint effusions.  No lower extremity tenderness.  No edema. Neurologic:   Somnolent, slurred speech.  No focal convulsive activity.  Motor grossly intact.   Skin:    Skin is warm, dry and intact. No rash noted.  No petechiae, purpura, or bullae.  ____________________________________________    LABS (pertinent positives/negatives) (all labs ordered are listed, but only abnormal results are displayed) Labs Reviewed  COMPREHENSIVE METABOLIC PANEL - Abnormal; Notable for the following components:      Result Value   Chloride 112 (*)    CO2 19 (*)    Glucose, Bld 113 (*)    Calcium 7.9 (*)    Total Protein 6.2 (*)    All other components within normal limits  CBC WITH DIFFERENTIAL/PLATELET - Abnormal; Notable for the following components:   WBC 11.5 (*)    HCT 38.6 (*)    Neutro Abs 9.2 (*)    Abs Immature Granulocytes 0.12 (*)    All other components within normal limits  TROPONIN I (HIGH SENSITIVITY) - Abnormal; Notable for the following components:   Troponin I (High Sensitivity) 217 (*)    All other components within normal limits  SARS CORONAVIRUS 2 BY RT PCR (HOSPITAL ORDER, [place] [facility])  PROTIME-INR  URINE DRUG SCREEN, QUALITATIVE ([facility] ONLY)  APTT  HEPARIN LEVEL (UNFRACTIONATED)  TROPONIN I (HIGH SENSITIVITY)   ____________________________________________   EKG  Interpreted by me Sinus rhythm rate of 78, normal axis, normal intervals.   Signs of LVH.  Diffuse ST elevation in inferior and lateral leads consistent with benign early repolarization.  Doubt STEMI.  Repeat EKG performed at 10:04 AM, shows sinus rhythm rate of 63, normal axis and intervals.  LVH with early repolarization in the inferior and lateral leads.  Doubt STEMI.  When compared to prior EKGs, similar ST elevation pattern is seen on 20-Jan-2019, but less pronounced on 07/07/2019.  ____________________________________________    RADIOLOGY  CT HEAD WO CONTRAST  Result Date: 11/19/2019 CLINICAL DATA:  Seizure, nontraumatic. Altered mental status, unclear cause. EXAM: CT HEAD WITHOUT CONTRAST TECHNIQUE: Contiguous axial images were obtained from the base of the skull through  the vertex without intravenous contrast. COMPARISON:  Prior head CT examinations 08/20/2017 and earlier FINDINGS: Brain: Cerebral volume is normal. There is no acute intracranial hemorrhage. No demarcated cortical infarct. No extra-axial fluid collection. No evidence of intracranial mass. No midline shift. Vascular: No hyperdense vessel. Skull: Normal. Negative for fracture or focal lesion. Sinuses/Orbits: Visualized orbits show no acute finding. No significant paranasal sinus disease or mastoid effusion at the imaged levels. IMPRESSION: Unremarkable non-contrast CT appearance of the brain. No evidence of acute intracranial abnormality. Electronically Signed   By: Kellie Simmering DO   On: 11/19/2019 10:05   DG Chest Portable 1 View  Result Date: 11/19/2019 CLINICAL DATA:  Unresponsive.  Drug overdose. EXAM: PORTABLE CHEST 1 VIEW COMPARISON:  None. FINDINGS: The cardiac silhouette is borderline to mildly enlarged. No confluent airspace opacity, edema, pleural effusion, pneumothorax is identified. No acute osseous abnormality is seen. IMPRESSION: No active disease. Electronically Signed   By: Logan Bores M.D.   On: 11/19/2019 09:39     ____________________________________________   PROCEDURES .Critical Care Performed by: Carrie Mew, MD Authorized by: Carrie Mew, MD   Critical care provider statement:    Critical care time (minutes):  35   Critical care time was exclusive of:  Separately billable procedures and treating other patients   Critical care was necessary to treat or prevent imminent or life-threatening deterioration of the following conditions:  Cardiac failure and toxidrome   Critical care was time spent personally by me on the following activities:  Development of treatment plan with patient or surrogate, discussions with consultants, evaluation of patient's response to treatment, examination of patient, obtaining history from patient or surrogate, ordering and performing treatments and interventions, ordering and review of laboratory studies, ordering and review of radiographic studies, pulse oximetry, re-evaluation of patient's condition and review of old charts    ____________________________________________  DIFFERENTIAL DIAGNOSIS   Intoxication/overdose, aspiration pneumonia, intracranial hemorrhage, ACS  CLINICAL IMPRESSION / ASSESSMENT AND PLAN / ED COURSE  Medications ordered in the ED: Medications  heparin bolus via infusion 4,000 Units (has no administration in time range)  heparin ADULT infusion 100 units/mL (25000 units/232mL sodium chloride 0.45%) (has no administration in time range)  sodium chloride 0.9 % bolus 500 mL (0 mLs Intravenous Stopped 11/19/19 1029)  levETIRAcetam (KEPPRA) IVPB 1500 mg/ 100 mL premix (1,500 mg Intravenous New Bag/Given 11/19/19 1058)  aspirin chewable tablet 324 mg (324 mg Oral Given 11/19/19 1055)    Pertinent labs & imaging results that were available during my care of the patient were reviewed by me and considered in my medical decision making (see chart for details).   SEBASTIEN JACKSON was evaluated in Emergency Department on 11/19/2019 for  the symptoms described in the history of present illness. He was evaluated in the context of the global COVID-19 pandemic, which necessitated consideration that the patient might be at risk for infection with the SARS-CoV-2 virus that causes COVID-19. Institutional protocols and algorithms that pertain to the evaluation of patients at risk for COVID-19 are in a state of rapid change based on information released by regulatory bodies including the CDC and federal and state organizations. These policies and algorithms were followed during the patient's care in the ED.   Patient presents with altered mental status and clinically intoxicated after suspected drug overdose, reportedly intended to use cocaine.  Seem to be aroused by Narcan by EMS.  Given the presentation, lack of chest pain or shortness of breath is currently ascertainable, I doubt STEMI  based on the EKG findings.  I will repeat EKG and obtain troponins.  Will need to do a CT head to evaluate for intracranial hemorrhage, chest x-ray, labs.  Clinical Course as of Nov 18 1125  Fri Nov 19, 2019  1033 Troponin 217, compared to negative in the past.  Concerning for cocaine induced NSTEMI.  Patient reassessed, vital signs still normal, still denies any chest pain or shortness of breath.  Will discuss with cardiology.  Troponin I (High Sensitivity)(!!): 217 [PS]  1037 D/w cardiology Dr. Saralyn Pilar who agrees that EKG is unchanged from prior, c/w early repol, and in absence of CP would not do urgent heart cath, not c/w STEMI   [PS]    Clinical Course User Index [PS] Carrie Mew, MD     ____________________________________________   FINAL CLINICAL IMPRESSION(S) / ED DIAGNOSES    Final diagnoses:  NSTEMI (non-ST elevated myocardial infarction) [facility])  Cocaine abuse [facility])     ED Discharge Orders    None      Portions of this note were generated with dragon dictation software. Dictation errors may occur despite best attempts at  proofreading.   Carrie Mew, MD 11/19/19 [phone]

## 2019-11-19 NOTE — Progress Notes (Addendum)
Patients girlfriend at bedside, transported to patients room from the ED where she was discharged. Patient visibly upset that girlfriend can not stay the night, stated that his girlfriend does not have a ride home, that the ED did not provide a means of transportation for his girlfriend to get home. Patient stated he wanted to sign out AMA. This RN educated patient about plan of care and medications he is currently on. Girlfriend encouraged patient to stay overnight, patient agreed. Girlfriend will be picked up by friend by 9pm tonight. Charge RN made aware.

## 2019-11-19 NOTE — Progress Notes (Signed)
Asked to evaluate ECG for possible STEMI. ECG shows sinus with ST elevation inferior and lateral leads most consistent with early repolarization, similar to ECG from 01/12/2019. Patient denies CP, unlikely STEMI. Initial troponin mildly elevated likely secondary to cocaine use, possible seizure activity. Defer code STEMI.

## 2019-11-20 DIAGNOSIS — I959 Hypotension, unspecified: Secondary | ICD-10-CM | POA: Diagnosis not present

## 2019-11-20 DIAGNOSIS — F1410 Cocaine abuse, uncomplicated: Secondary | ICD-10-CM | POA: Diagnosis present

## 2019-11-20 DIAGNOSIS — Z20822 Contact with and (suspected) exposure to covid-19: Secondary | ICD-10-CM | POA: Diagnosis not present

## 2019-11-20 DIAGNOSIS — F209 Schizophrenia, unspecified: Secondary | ICD-10-CM | POA: Diagnosis not present

## 2019-11-20 DIAGNOSIS — R4182 Altered mental status, unspecified: Secondary | ICD-10-CM

## 2019-11-20 DIAGNOSIS — F1910 Other psychoactive substance abuse, uncomplicated: Secondary | ICD-10-CM | POA: Diagnosis not present

## 2019-11-20 DIAGNOSIS — R0902 Hypoxemia: Secondary | ICD-10-CM | POA: Diagnosis not present

## 2019-11-20 DIAGNOSIS — R4189 Other symptoms and signs involving cognitive functions and awareness: Secondary | ICD-10-CM | POA: Diagnosis not present

## 2019-11-20 DIAGNOSIS — I428 Other cardiomyopathies: Secondary | ICD-10-CM | POA: Diagnosis not present

## 2019-11-20 DIAGNOSIS — F319 Bipolar disorder, unspecified: Secondary | ICD-10-CM | POA: Diagnosis not present

## 2019-11-20 DIAGNOSIS — I214 Non-ST elevation (NSTEMI) myocardial infarction: Secondary | ICD-10-CM | POA: Diagnosis not present

## 2019-11-20 DIAGNOSIS — R778 Other specified abnormalities of plasma proteins: Secondary | ICD-10-CM | POA: Diagnosis not present

## 2019-11-20 DIAGNOSIS — G92 Toxic encephalopathy: Secondary | ICD-10-CM | POA: Diagnosis not present

## 2019-11-20 DIAGNOSIS — Z79899 Other long term (current) drug therapy: Secondary | ICD-10-CM | POA: Diagnosis not present

## 2019-11-20 DIAGNOSIS — R569 Unspecified convulsions: Secondary | ICD-10-CM | POA: Diagnosis not present

## 2019-11-20 DIAGNOSIS — T405X1A Poisoning by cocaine, accidental (unintentional), initial encounter: Secondary | ICD-10-CM | POA: Diagnosis not present

## 2019-11-20 DIAGNOSIS — I429 Cardiomyopathy, unspecified: Secondary | ICD-10-CM | POA: Diagnosis not present

## 2019-11-20 LAB — BASIC METABOLIC PANEL
Anion gap: 5 (ref 5–15)
BUN: 15 mg/dL (ref 6–20)
CO2: 29 mmol/L (ref 22–32)
Calcium: 8.9 mg/dL (ref 8.9–10.3)
Chloride: 106 mmol/L (ref 98–111)
Creatinine, Ser: 0.92 mg/dL (ref 0.61–1.24)
GFR calc Af Amer: >60 mL/min (ref >60)
GFR calc non Af Amer: >60 mL/min (ref >60)
Glucose, Bld: 94 mg/dL (ref 70–99)
Potassium: 4.3 mmol/L (ref 3.5–5.1)
Sodium: 140 mmol/L (ref 135–145)

## 2019-11-20 LAB — CBC
HCT: 39.1 % (ref 39.0–52.0)
Hemoglobin: 13.3 g/dL (ref 13.0–17.0)
MCH: 29.8 pg (ref 26.0–34.0)
MCHC: 34 g/dL (ref 30.0–36.0)
MCV: 87.7 fL (ref 80.0–100.0)
Platelets: 149 10*3/uL — ABNORMAL LOW (ref 150–400)
RBC: 4.46 MIL/uL (ref 4.22–5.81)
RDW: 15.4 % (ref 11.5–15.5)
WBC: 10.4 10*3/uL (ref 4.0–10.5)
nRBC: 0 % (ref 0.0–0.2)

## 2019-11-20 LAB — HEPARIN LEVEL (UNFRACTIONATED)
Heparin Unfractionated: 0.25 IU/mL — ABNORMAL LOW (ref 0.30–0.70)
Heparin Unfractionated: 0.31 IU/mL (ref 0.30–0.70)

## 2019-11-20 LAB — HEMOGLOBIN A1C
Hgb A1c MFr Bld: 5.8 % — ABNORMAL HIGH (ref 4.8–5.6)
Mean Plasma Glucose: 119.76 mg/dL

## 2019-11-20 MED ORDER — SUCRALFATE 1 GM/10ML PO SUSP
1.0000 g | Freq: Three times a day (TID) | ORAL | Status: DC
  Administered 2019-11-20: 1 g via ORAL
  Filled 2019-11-20 (×4): qty 10

## 2019-11-20 MED ORDER — PANTOPRAZOLE SODIUM 40 MG IV SOLR
40.0000 mg | Freq: Two times a day (BID) | INTRAVENOUS | Status: DC
  Administered 2019-11-20: 40 mg via INTRAVENOUS
  Filled 2019-11-20: qty 40

## 2019-11-20 MED ORDER — LEVETIRACETAM IN NACL 1000 MG/100ML IV SOLN
1000.0000 mg | Freq: Two times a day (BID) | INTRAVENOUS | Status: DC
  Filled 2019-11-20 (×2): qty 100

## 2019-11-20 MED ORDER — LEVETIRACETAM 500 MG PO TABS: 1000.0000 mg | ORAL_TABLET | Freq: Two times a day (BID) | ORAL | Status: DC

## 2019-11-20 MED ORDER — ONDANSETRON HCL 4 MG/2ML IJ SOLN
4.0000 mg | Freq: Four times a day (QID) | INTRAMUSCULAR | Status: DC | PRN
  Administered 2019-11-20 (×2): 4 mg via INTRAVENOUS
  Filled 2019-11-20 (×2): qty 2

## 2019-11-20 MED ORDER — ARIPIPRAZOLE ER 400 MG IM SRER
300.0000 mg | Freq: Once | INTRAMUSCULAR | Status: DC
  Filled 2019-11-20: qty 2

## 2019-11-20 NOTE — Progress Notes (Signed)
Progress Note  Patient Name: Luis Hunter Date of Encounter: 11/20/2019  [facility] Cardiologist: No primary care provider on file.   Subjective   No chest pain or shortness of breath.  Inpatient Medications    Scheduled Meds: . ARIPiprazole ER  300 mg Intramuscular Once  . aspirin  324 mg Oral Daily  . atorvastatin  40 mg Oral Daily  . nicotine  21 mg Transdermal Daily  . pantoprazole (PROTONIX) IV  40 mg Intravenous Q12H  . sucralfate  1 g Oral TID WC & HS   Continuous Infusions: . sodium chloride 75 mL/hr at 11/20/19 0323  . heparin 1,000 Units/hr (11/20/19 0201)  . levETIRAcetam 1,000 mg (11/20/19 0916)   PRN Meds: acetaminophen, LORazepam, nitroGLYCERIN, ondansetron (ZOFRAN) IV   Vital Signs    Vitals:   11/19/19 1911 11/20/19 0006 11/20/19 0337 11/20/19 0803  BP: (!) 128/98 126/84 114/80 124/81  Pulse: (!) 57 (!) 56 (!) 51 (!) 55  Resp:    17  Temp: 97.6 F (36.4 C) 97.8 F (36.6 C) 98.1 F (36.7 C) 98 F (36.7 C)  TempSrc: Oral Oral    SpO2: 100% 100% 100% 100%  Weight:   84.4 kg   Height:        Intake/Output Summary (Last 24 hours) at 11/20/2019 1042 Last data filed at 11/20/2019 0700 Gross per 24 hour  Intake 1505.22 ml  Output 900 ml  Net 605.22 ml   Last 3 Weights 11/20/2019 11/19/2019 11/19/2019  Weight (lbs) 186 lb 1.6 oz 186 lb 9.6 oz 165 lb  Weight (kg) 84.414 kg 84.641 kg 74.844 kg      Telemetry    Normal sinus rhythm/sinus bradycardia without significant arrhythmia - Personally Reviewed  ECG    Sinus bradycardia 50 bpm, early repolarization pattern- Personally Reviewed  Physical Exam  Alert, oriented, no distress.  Thin African-American male GEN: No acute distress.   Neck: No JVD Cardiac: RRR, no murmurs, rubs, or gallops.  Respiratory: Clear to auscultation bilaterally. GI: Soft, nontender, non-distended  MS: No edema; No deformity. Neuro:  Nonfocal  Psych: Normal affect   Labs    High Sensitivity Troponin:    Recent Labs  Lab 11/19/19 0916 11/19/19 1145 11/19/19 1605 11/19/19 1837  TROPONINIHS 217* 8 12 13       Chemistry Recent Labs  Lab 11/19/19 0916 11/20/19 0050  NA 140 140  K 3.5 4.3  CL 112* 106  CO2 19* 29  GLUCOSE 113* 94  BUN 15 15  CREATININE 1.10 0.92  CALCIUM 7.9* 8.9  PROT 6.2*  --   ALBUMIN 3.7  --   AST 28  --   ALT 22  --   ALKPHOS 46  --   BILITOT 0.7  --   GFRNONAA >60 >60  GFRAA >60 >60  ANIONGAP 9 5     Hematology Recent Labs  Lab 11/19/19 0916 11/20/19 0050  WBC 11.5* 10.4  RBC 4.47 4.46  HGB 13.4 13.3  HCT 38.6* 39.1  MCV 86.4 87.7  MCH 30.0 29.8  MCHC 34.7 34.0  RDW 15.1 15.4  PLT 161 149*    BNPNo results for input(s): BNP, PROBNP in the last 168 hours.   DDimer No results for input(s): DDIMER in the last 168 hours.   Radiology    CT HEAD WO CONTRAST  Result Date: 11/19/2019 CLINICAL DATA:  Seizure, nontraumatic. Altered mental status, unclear cause. EXAM: CT HEAD WITHOUT CONTRAST TECHNIQUE: Contiguous axial images were obtained from the  base of the skull through the vertex without intravenous contrast. COMPARISON:  Prior head CT examinations 08/20/2017 and earlier FINDINGS: Brain: Cerebral volume is normal. There is no acute intracranial hemorrhage. No demarcated cortical infarct. No extra-axial fluid collection. No evidence of intracranial mass. No midline shift. Vascular: No hyperdense vessel. Skull: Normal. Negative for fracture or focal lesion. Sinuses/Orbits: Visualized orbits show no acute finding. No significant paranasal sinus disease or mastoid effusion at the imaged levels. IMPRESSION: Unremarkable non-contrast CT appearance of the brain. No evidence of acute intracranial abnormality. Electronically Signed   By: Kellie Simmering DO   On: 11/19/2019 10:05   DG Chest Portable 1 View  Result Date: 11/19/2019 CLINICAL DATA:  Unresponsive.  Drug overdose. EXAM: PORTABLE CHEST 1 VIEW COMPARISON:  None. FINDINGS: The cardiac silhouette  is borderline to mildly enlarged. No confluent airspace opacity, edema, pleural effusion, pneumothorax is identified. No acute osseous abnormality is seen. IMPRESSION: No active disease. Electronically Signed   By: Logan Bores M.D.   On: 11/19/2019 09:39   ECHOCARDIOGRAM COMPLETE  Result Date: 11/19/2019    ECHOCARDIOGRAM REPORT   Patient Name:   Luis Hunter Date of Exam: 11/19/2019 Medical Rec #:  [id number]     Height:       70.0 in Accession #:    [id number]    Weight:       165.0 lb Date of Birth:  03-26-1976     BSA:          1.923 m Patient Age:    44 years      BP:           122/82 mmHg Patient Gender: M             HR:           53 bpm. Exam Location:  [facility] Procedure: 2D Echo, Cardiac Doppler and Color Doppler Indications:     History of syncope  History:         Patient has no prior history of Echocardiogram examinations.                  Seizures, schizophrenia, bipolar disorder.  Sonographer:     Sherrie Sport RDCS (AE) Referring Phys:  [id number] Arvil Chaco Diagnosing Phys: Ida Rogue MD  Sonographer Comments: No apical window. IMPRESSIONS  1. Left ventricular ejection fraction, by estimation, is 40%. The left ventricle has mildly decreased function. The left ventricle demonstrates global hypokinesis. There is moderate left ventricular hypertrophy.  2. Right ventricular systolic function is normal. The right ventricular size is normal. Tricuspid regurgitation signal is inadequate for assessing PA pressure. FINDINGS  Left Ventricle: Left ventricular ejection fraction, by estimation, is 40 to 45%. The left ventricle has mildly decreased function. The left ventricle demonstrates global hypokinesis. The left ventricular internal cavity size was normal in size. There is  moderate left ventricular hypertrophy. Left ventricular diastolic parameters are indeterminate. Right Ventricle: The right ventricular size is normal. No increase in right ventricular wall thickness. Right ventricular systolic  function is normal. Tricuspid regurgitation signal is inadequate for assessing PA pressure. Left Atrium: Left atrial size was normal in size. Right Atrium: Right atrial size was normal in size. Pericardium: There is no evidence of pericardial effusion. Mitral Valve: The mitral valve is normal in structure. Normal mobility of the mitral valve leaflets. No evidence of mitral valve regurgitation. No evidence of mitral valve stenosis. Tricuspid Valve: The tricuspid valve is normal in structure. Tricuspid valve regurgitation is  not demonstrated. No evidence of tricuspid stenosis. Aortic Valve: The aortic valve was not well visualized. Aortic valve regurgitation is not visualized. No aortic stenosis is present. Pulmonic Valve: The pulmonic valve was normal in structure. Pulmonic valve regurgitation is not visualized. No evidence of pulmonic stenosis. Aorta: The aortic root is normal in size and structure. Venous: The inferior vena cava is normal in size with greater than 50% respiratory variability, suggesting right atrial pressure of 3 mmHg. IAS/Shunts: No atrial level shunt detected by color flow Doppler.  LEFT VENTRICLE PLAX 2D LVIDd:         4.47 cm LVIDs:         3.57 cm LV PW:         1.83 cm LV IVS:        1.44 cm LVOT diam:     2.10 cm LVOT Area:     3.46 cm  LEFT ATRIUM         Index LA diam:    2.50 cm 1.30 cm/m                        PULMONIC VALVE AORTA                 PV Vmax:        0.59 m/s Ao Root diam: 2.50 cm PV Peak grad:   1.4 mmHg                       RVOT Peak grad: 2 mmHg   SHUNTS Systemic Diam: 2.10 cm Ida Rogue MD Electronically signed by Ida Rogue MD Signature Date/Time: 11/19/2019/6:55:24 PM    Final     Cardiac Studies   2D Echo: IMPRESSIONS    1. Left ventricular ejection fraction, by estimation, is 40%. The left  ventricle has mildly decreased function. The left ventricle demonstrates  global hypokinesis. There is moderate left ventricular hypertrophy.  2. Right  ventricular systolic function is normal. The right ventricular  size is normal. Tricuspid regurgitation signal is inadequate for assessing  PA pressure.   Patient Profile     44 y.o. male admitted with unresponsiveness suspected drug overdose, cardiology consulted for elevated troponin.  Initially code STEMI is called but this was canceled with EKG changes felt to be secondary to early repolarization and not significantly changed from previous tracings.  Assessment & Plan    1.  Altered mental status/unresponsiveness: Likely related to polysubstance abuse.  Urine drug screen positive for cocaine, THC, and benzodiazepines.  Cessation counseling done.  Patient demonstrates very little insight. 2.  Elevated troponin: Transient troponin elevation of 217.  He has had 3 follow-up high-sensitivity troponins that have all been within normal limits.  No evidence of any significant myocardial injury. 3.  Cardiomyopathy: I personally reviewed the patient's echo images from this morning.  He has moderate concentric LV hypertrophy.  LVEF is estimated at 40 to 45% without LV dilatation.  There are no regional wall motion abnormalities.  Suspect he has cardiomyopathy related to polysubstance use.  Doubt underlying coronary artery disease.  Would not start beta-blocker in setting of cocaine use.  In fact, I would favor arranging cardiology outpatient follow-up before starting him on any medical therapy.  I think that there is a low likelihood that he will demonstrate compliance even for use of an ACE inhibitor/ARB.  Consider initiating as an outpatient.  Patient is stable for hospital discharge from a cardiac perspective.  Will sign off today.  Will arrange outpatient cardiology follow-up.  [facility] will sign off.   Medication Recommendations:  none Other recommendations (labs, testing, etc):  none Follow up as an outpatient:  [facility] - will call with follow-up appointment  For questions or  updates, please contact [facility] Please consult [email or web address] for contact info under     Signed, Sherren Mocha, MD  11/20/2019, 10:42 AM

## 2019-11-20 NOTE — Progress Notes (Signed)
[place] for heparin Indication: chest pain/ACS  No Known Allergies  Patient Measurements: Height: 6' (182.9 cm) Weight: 84.4 kg (186 lb 1.6 oz) IBW/kg (Calculated) : 77.6 Heparin Dosing Weight: 74.8 kg   Vital Signs: Temp: 98 F (36.7 C) (07/17 0803) Temp Source: Oral (07/17 0006) BP: 124/81 (07/17 0803) Pulse Rate: 55 (07/17 0803)  Labs: Recent Labs    11/19/19 0916 11/19/19 0916 11/19/19 1054 11/19/19 1145 11/19/19 1605 11/19/19 1837 11/20/19 0050 11/20/19 0755  HGB 13.4  --   --   --   --   --  13.3  --   HCT 38.6*  --   --   --   --   --  39.1  --   PLT 161  --   --   --   --   --  149*  --   APTT  --   --  27  --   --   --   --   --   LABPROT  --   --  12.8  --   --   --   --   --   INR  --   --  1.0  --   --   --   --   --   HEPARINUNFRC  --   --   --   --   --  0.34 0.25* 0.31  CREATININE 1.10  --   --   --   --   --  0.92  --   TROPONINIHS 217*   < >  --  8 12 13   --   --    < > = values in this interval not displayed.    Estimated Creatinine Clearance: 111.3 mL/min (by C-G formula based on SCr of 0.92 mg/dL).   Medical History: Past Medical History:  Diagnosis Date  . Bipolar disorder ([place])   . Depression   . Schizophrenia ([place])   . Seizures [facility])      Assessment: 44 yo male found unresponsive, now with elevated troponin. No oral anticoagulants noted on PTA med list.  Goal of Therapy:  Heparin level 0.3-0.7 units/ml Monitor platelets by anticoagulation protocol: Yes   Plan:  07/17 0755 HL 0.31 therapeutic. Will bump heparin drip to 1050 units/hr since level is borderline. Repeat HL at 1800. CBC daily.  Tawnya Crook, PharmD 11/20/2019,10:40 AM

## 2019-11-20 NOTE — Progress Notes (Signed)
PROGRESS NOTE    Luis Hunter   BPZ:[id number]  DOB: 11-28-1975  PCP: Lorelee Market, MD    DOA: 11/19/2019 LOS: 0   Brief Narrative   Luis Hunter is a 44 y.o. male with medical history of polysubstance abuse, seizure, depression, bipolar disorder, schizophrenia, tobacco abuse, BPPV, who presented to the ED on 11/19/19 via EMS due to unresponsiveness after suspected drug overdose.  His girlfriend reported they obtained cocaine from a new source and both became unresponsive after using.  Both responded to Narcan given by EMS.  Pt was noted to have some seizure-like activity, given Versed.  In the ED, patient remained minimally responsive but could say his name.  Vitals were stable.  Labs showed mild leukocytosis 11.5k, troponin elevated at 217 with ST segment elevation.  EKG was reviewed in ED by cardiologist who noted similar EKG changes back in September 2020, therefore code STEMI was deferred.  CT head and CXR were negative for acute findings.  Admitted to hospitalist service.  Cardiology consulted. Patient was empirically started on anticoagulation with heparin.  Echo obtained and showed EF of 40% with global left ventricular hypokinesis and moderate LVH.     Assessment & Plan   Principal Problem:   Unresponsiveness Active Problems:   Seizure ([place])   Schizophrenia ([place])   Bipolar disorder (H[facility])   Elevated troponin   Tobacco abuse   Leukocytosis   Polysubstance abuse (H[facility])   Nausea / Vomiting / Inability to tolerate PO intake - POA.  Several day history prior to admission and ongoing.  Unable to keep down food or beverages.  Suspect PUD or gastritis.  GI consulted.  Acute encephalopathy - unresponsiveness POA.   Likely due to polysubstance abuse, EMS reported improvement with Narcan in patient and his girlfriend.  Now resolved.  CT head negative.  No focal neuro deficit on physical examination.    Elevated troponin / Abnormal EKG - POA with troponin 217 -->8.   Cardiology was consulted due to concern for STEMI, but EKG similar to prior and no chest pain. Likely cocaine-induced myocardial strain.  Code STEMI was deferred.  Patient denies chest pain.   Cardiomyopathy - EF 40-45% without regional wall motion abnormalities.  Likely due to long term substance abuse.  Avoid beta blocker in setting of cocaine abuse.  Cardiology has low suspicion for underlying CAD.  Outpatient cardiology follow up recommended.  Stop ASA, statin - lipid panel normal.  Seizure disorder - chronic, had seizure-likely episode witnessed by EMS.  Loaded with Keppra IV in the ED.  Seizure precautions.  PRN Ativan for seizure activity.  IV Keppra in place of home oral given N/V.  Keppra 1000 mg BID.  Resume oral when tolerating PO.  Schizophrenia and bipolar disorder - chronic, stable.  Will order his monthly Abilify injection that was due yesterday.  Tobacco abuse and polysubstance abuse - nicotine patch.  Counseled regarding importance of smoking cessation and risks of substance abuse.    Leukocytosis - POA with WBC 11.5.  No fever or other signs of infection.  Likely reactive.  Follow-up with CBC. Resolved.   DVT prophylaxis:  SCD's    Diet:  Diet Orders (From admission, onward)    Start     Ordered   11/19/19 1723  Diet regular Room service appropriate? Yes; Fluid consistency: Thin  Diet effective now       Question Answer Comment  Room service appropriate? Yes   Fluid consistency: Thin  11/19/19 1722            Code Status: Full Code    Subjective 11/20/19    Patient seen at bedside this AM.  He is awake, alert and oriented but has no recollection of events yesterday.  Asks what happened.  Says he has not been able to keep down any food or drink for several days.  Denies abdominal pain.  Says foods feel like they get stuck in his chest at times.  No fever or chills.  No chest pain or SOB.  No other complaints.   Disposition Plan & Communication   Status  is: Inpatient  Remains inpatient appropriate because:Inpatient level of care appropriate due to severity of illness.  Unable to tolerate oral intake.  GI evaluation pending.   Dispo: The patient is from: Home              Anticipated d/c is to: Home              Anticipated d/c date is: 1 day              Patient currently is not medically stable to d/c.   Family Communication: none at bedside, will attempt to call.    Consults, Procedures, Significant Events   Consultants:   Cardiology  Gastroenterology  Procedures:   None  Antimicrobials:   None    Objective   Vitals:   11/19/19 1911 11/20/19 0006 11/20/19 0337 11/20/19 0803  BP: (!) 128/98 126/84 114/80 124/81  Pulse: (!) 57 (!) 56 (!) 51 (!) 55  Resp:    17  Temp: 97.6 F (36.4 C) 97.8 F (36.6 C) 98.1 F (36.7 C) 98 F (36.7 C)  TempSrc: Oral Oral    SpO2: 100% 100% 100% 100%  Weight:   84.4 kg   Height:        Intake/Output Summary (Last 24 hours) at 11/20/2019 0850 Last data filed at 11/20/2019 0700 Gross per 24 hour  Intake 1505.22 ml  Output 900 ml  Net 605.22 ml   Filed Weights   11/19/19 0904 11/19/19 1627 11/20/19 0337  Weight: 74.8 kg 84.6 kg 84.4 kg    Physical Exam:  General exam: awake, alert, no acute distress Respiratory system: CTAB, no wheezes, rales or rhonchi, normal respiratory effort. Cardiovascular system: normal S1/S2, RRR, no JVD, murmurs, rubs, gallops, no pedal edema.   Gastrointestinal system: soft, NT, ND, no HSM felt, +bowel sounds. Central nervous system: A&O x3. no gross focal neurologic deficits, normal speech Extremities: moves all, no edema, normal tone Psychiatry: normal mood, congruent affect  Labs   Data Reviewed: I have personally reviewed following labs and imaging studies  CBC: Recent Labs  Lab 11/19/19 0916 11/20/19 0050  WBC 11.5* 10.4  NEUTROABS 9.2*  --   HGB 13.4 13.3  HCT 38.6* 39.1  MCV 86.4 87.7  PLT 161 850*   Basic Metabolic  Panel: Recent Labs  Lab 11/19/19 0916 11/20/19 0050  NA 140 140  K 3.5 4.3  CL 112* 106  CO2 19* 29  GLUCOSE 113* 94  BUN 15 15  CREATININE 1.10 0.92  CALCIUM 7.9* 8.9   GFR: Estimated Creatinine Clearance: 111.3 mL/min (by C-G formula based on SCr of 0.92 mg/dL). Liver Function Tests: Recent Labs  Lab 11/19/19 0916  AST 28  ALT 22  ALKPHOS 46  BILITOT 0.7  PROT 6.2*  ALBUMIN 3.7   No results for input(s): LIPASE, AMYLASE in the last 168 hours. No  results for input(s): AMMONIA in the last 168 hours. Coagulation Profile: Recent Labs  Lab 11/19/19 1054  INR 1.0   Cardiac Enzymes: No results for input(s): CKTOTAL, CKMB, CKMBINDEX, TROPONINI in the last 168 hours. BNP (last 3 results) No results for input(s): PROBNP in the last 8760 hours. HbA1C: No results for input(s): HGBA1C in the last 72 hours. CBG: No results for input(s): GLUCAP in the last 168 hours. Lipid Profile: Recent Labs    11/19/19 1407  CHOL 141  HDL 49  LDLCALC 87  TRIG 23  CHOLHDL 2.9   Thyroid Function Tests: No results for input(s): TSH, T4TOTAL, FREET4, T3FREE, THYROIDAB in the last 72 hours. Anemia Panel: No results for input(s): VITAMINB12, FOLATE, FERRITIN, TIBC, IRON, RETICCTPCT in the last 72 hours. Sepsis Labs: No results for input(s): PROCALCITON, LATICACIDVEN in the last 168 hours.  Recent Results (from the past 240 hour(s))  SARS Coronavirus 2 by RT PCR (hospital order, performed in [facility] hospital lab) Nasopharyngeal Nasopharyngeal Swab     Status: None   Collection Time: 11/19/19 10:33 AM   Specimen: Nasopharyngeal Swab  Result Value Ref Range Status   SARS Coronavirus 2 NEGATIVE NEGATIVE Final    Comment: (NOTE) SARS-CoV-2 target nucleic acids are NOT DETECTED.  The SARS-CoV-2 RNA is generally detectable in upper and lower respiratory specimens during the acute phase of infection. The lowest concentration of SARS-CoV-2 viral copies this assay can detect is  250 copies / mL. A negative result does not preclude SARS-CoV-2 infection and should not be used as the sole basis for treatment or other patient management decisions.  A negative result may occur with improper specimen collection / handling, submission of specimen other than nasopharyngeal swab, presence of viral mutation(s) within the areas targeted by this assay, and inadequate number of viral copies (<250 copies / mL). A negative result must be combined with clinical observations, patient history, and epidemiological information.  Fact Sheet for Patients:   StrictlyIdeas.no  Fact Sheet for Healthcare Providers: BankingDealers.co.za  This test is not yet approved or  cleared by the [place] FDA and has been authorized for detection and/or diagnosis of SARS-CoV-2 by FDA under an Emergency Use Authorization (EUA).  This EUA will remain in effect (meaning this test can be used) for the duration of the COVID-19 declaration under Section 564(b)(1) of the Act, 21 U.S.C. section 360bbb-3(b)(1), unless the authorization is terminated or revoked sooner.  Performed at [facility], [address], [place], [place] [address]       Imaging Studies   CT HEAD WO CONTRAST  Result Date: 11/19/2019 CLINICAL DATA:  Seizure, nontraumatic. Altered mental status, unclear cause. EXAM: CT HEAD WITHOUT CONTRAST TECHNIQUE: Contiguous axial images were obtained from the base of the skull through the vertex without intravenous contrast. COMPARISON:  Prior head CT examinations 08/20/2017 and earlier FINDINGS: Brain: Cerebral volume is normal. There is no acute intracranial hemorrhage. No demarcated cortical infarct. No extra-axial fluid collection. No evidence of intracranial mass. No midline shift. Vascular: No hyperdense vessel. Skull: Normal. Negative for fracture or focal lesion. Sinuses/Orbits: Visualized orbits show no acute finding. No  significant paranasal sinus disease or mastoid effusion at the imaged levels. IMPRESSION: Unremarkable non-contrast CT appearance of the brain. No evidence of acute intracranial abnormality. Electronically Signed   By: Kellie Simmering DO   On: 11/19/2019 10:05   DG Chest Portable 1 View  Result Date: 11/19/2019 CLINICAL DATA:  Unresponsive.  Drug overdose. EXAM: PORTABLE CHEST 1 VIEW  COMPARISON:  None. FINDINGS: The cardiac silhouette is borderline to mildly enlarged. No confluent airspace opacity, edema, pleural effusion, pneumothorax is identified. No acute osseous abnormality is seen. IMPRESSION: No active disease. Electronically Signed   By: Logan Bores M.D.   On: 11/19/2019 09:39   ECHOCARDIOGRAM COMPLETE  Result Date: 11/19/2019    ECHOCARDIOGRAM REPORT   Patient Name:   Luis Hunter Date of Exam: 11/19/2019 Medical Rec #:  [id number]     Height:       70.0 in Accession #:    [id number]    Weight:       165.0 lb Date of Birth:  02/03/1976     BSA:          1.923 m Patient Age:    67 years      BP:           122/82 mmHg Patient Gender: M             HR:           53 bpm. Exam Location:  [facility] Procedure: 2D Echo, Cardiac Doppler and Color Doppler Indications:     History of syncope  History:         Patient has no prior history of Echocardiogram examinations.                  Seizures, schizophrenia, bipolar disorder.  Sonographer:     Sherrie Sport RDCS (AE) Referring Phys:  [id number] Arvil Chaco Diagnosing Phys: Ida Rogue MD  Sonographer Comments: No apical window. IMPRESSIONS  1. Left ventricular ejection fraction, by estimation, is 40%. The left ventricle has mildly decreased function. The left ventricle demonstrates global hypokinesis. There is moderate left ventricular hypertrophy.  2. Right ventricular systolic function is normal. The right ventricular size is normal. Tricuspid regurgitation signal is inadequate for assessing PA pressure. FINDINGS  Left Ventricle: Left ventricular ejection  fraction, by estimation, is 40 to 45%. The left ventricle has mildly decreased function. The left ventricle demonstrates global hypokinesis. The left ventricular internal cavity size was normal in size. There is  moderate left ventricular hypertrophy. Left ventricular diastolic parameters are indeterminate. Right Ventricle: The right ventricular size is normal. No increase in right ventricular wall thickness. Right ventricular systolic function is normal. Tricuspid regurgitation signal is inadequate for assessing PA pressure. Left Atrium: Left atrial size was normal in size. Right Atrium: Right atrial size was normal in size. Pericardium: There is no evidence of pericardial effusion. Mitral Valve: The mitral valve is normal in structure. Normal mobility of the mitral valve leaflets. No evidence of mitral valve regurgitation. No evidence of mitral valve stenosis. Tricuspid Valve: The tricuspid valve is normal in structure. Tricuspid valve regurgitation is not demonstrated. No evidence of tricuspid stenosis. Aortic Valve: The aortic valve was not well visualized. Aortic valve regurgitation is not visualized. No aortic stenosis is present. Pulmonic Valve: The pulmonic valve was normal in structure. Pulmonic valve regurgitation is not visualized. No evidence of pulmonic stenosis. Aorta: The aortic root is normal in size and structure. Venous: The inferior vena cava is normal in size with greater than 50% respiratory variability, suggesting right atrial pressure of 3 mmHg. IAS/Shunts: No atrial level shunt detected by color flow Doppler.  LEFT VENTRICLE PLAX 2D LVIDd:         4.47 cm LVIDs:         3.57 cm LV PW:         1.83 cm LV IVS:  1.44 cm LVOT diam:     2.10 cm LVOT Area:     3.46 cm  LEFT ATRIUM         Index LA diam:    2.50 cm 1.30 cm/m                        PULMONIC VALVE AORTA                 PV Vmax:        0.59 m/s Ao Root diam: 2.50 cm PV Peak grad:   1.4 mmHg                       RVOT Peak grad:  2 mmHg   SHUNTS Systemic Diam: 2.10 cm Ida Rogue MD Electronically signed by Ida Rogue MD Signature Date/Time: 11/19/2019/6:55:24 PM    Final      Medications   Scheduled Meds: . aspirin  324 mg Oral Daily  . atorvastatin  40 mg Oral Daily  . nicotine  21 mg Transdermal Daily   Continuous Infusions: . sodium chloride 75 mL/hr at 11/20/19 0323  . heparin 1,000 Units/hr (11/20/19 0201)  . levETIRAcetam 1,000 mg (11/19/19 2150)       LOS: 0 days    Time spent: 35 minutes with > 50% spent in coordination of care and direct patient contact.    Ezekiel Slocumb, DO [facility]  11/20/2019, 8:50 AM    If 7PM-7AM, please contact night-coverage. How to contact the [facility] Attending or Consulting provider [place] or covering provider during after hours [place], for this patient?    1. Check the care team in [facility] and look for a) attending/consulting [facility] provider listed and b) the [facility] team listed 2. Log into [email or web address] and use [place]'s universal password to access. If you do not have the password, please contact the hospital operator. 3. Locate the [facility] provider you are looking for under [facility] and page to a number that you can be directly reached. 4. If you still have difficulty reaching the provider, please page the [facility] (Director on Call) for the Hospitalists listed on amion for assistance.

## 2019-11-20 NOTE — Progress Notes (Signed)
Patient educated on the importance of staying for further work up due to not being able to keep food down. Patient has decided to leave AMA. Charge RN and MD made aware.

## 2019-11-20 NOTE — Progress Notes (Signed)
[place] for heparin Indication: chest pain/ACS  No Known Allergies  Patient Measurements: Height: 6' (182.9 cm) Weight: 84.6 kg (186 lb 9.6 oz) IBW/kg (Calculated) : 77.6 Heparin Dosing Weight: 74.8 kg   Vital Signs: Temp: 97.8 F (36.6 C) (07/17 0006) Temp Source: Oral (07/17 0006) BP: 126/84 (07/17 0006) Pulse Rate: 56 (07/17 0006)  Labs: Recent Labs    11/19/19 0916 11/19/19 0916 11/19/19 1054 11/19/19 1145 11/19/19 1605 11/19/19 1837 11/20/19 0050  HGB 13.4  --   --   --   --   --  13.3  HCT 38.6*  --   --   --   --   --  39.1  PLT 161  --   --   --   --   --  149*  APTT  --   --  27  --   --   --   --   LABPROT  --   --  12.8  --   --   --   --   INR  --   --  1.0  --   --   --   --   HEPARINUNFRC  --   --   --   --   --  0.34 0.25*  CREATININE 1.10  --   --   --   --   --  0.92  TROPONINIHS 217*   < >  --  8 12 13   --    < > = values in this interval not displayed.    Estimated Creatinine Clearance: 111.3 mL/min (by C-G formula based on SCr of 0.92 mg/dL).   Medical History: Past Medical History:  Diagnosis Date  . Bipolar disorder ([place])   . Depression   . Schizophrenia ([place])   . Seizures [facility])      Assessment: 44 yo male found unresponsive, now with elevated troponin. No oral anticoagulants noted on PTA med list.  Goal of Therapy:  Heparin level 0.3-0.7 units/ml Monitor platelets by anticoagulation protocol: Yes   Plan:  07/17 @ 0100 HL 0.25 subtherapeutic. Will increase rate to 1000 units/hr and will recheck HL at 0800, CBC stable will continue to monitor.  Tobie Lords, PharmD 11/20/2019,1:59 AM

## 2019-11-20 NOTE — Progress Notes (Addendum)
Pt is complaining of nausea. Zofran was administered at [place] and scheduled at every 8 hours PRN. Will continue to monitor.  Update 0130: Talked to [facility] NP and change the frequency every 6 hours PRN for zofran. Will continue to monitor.

## 2019-11-20 NOTE — Plan of Care (Signed)
  Problem: Health Behavior/Discharge Planning: Goal: Ability to manage health-related needs will improve Outcome: Progressing   Problem: Clinical Measurements: Goal: Cardiovascular complication will be avoided Outcome: Progressing   Problem: Safety: Goal: Ability to remain free from injury will improve Outcome: Progressing

## 2019-11-20 NOTE — Hospital Course (Signed)
Luis Hunter is a 44 y.o. male with medical history of polysubstance abuse, seizure, depression, bipolar disorder, schizophrenia, tobacco abuse, BPPV, who presented to the ED on 11/19/19 via EMS due to unresponsiveness after suspected drug overdose.  His girlfriend reported they obtained cocaine from a new source and both became unresponsive after using.  Both responded to Narcan given by EMS.  Pt was noted to have some seizure-like activity, given Versed.  In the ED, patient remained minimally responsive but could say his name.  Vitals were stable.  Labs showed mild leukocytosis 11.5k, troponin elevated at 217 with ST segment elevation.  EKG was reviewed in ED by cardiologist who noted similar EKG changes back in September 2020, therefore code STEMI was deferred.  CT head and CXR were negative for acute findings.  Admitted to hospitalist service.  Cardiology consulted. Patient was empirically started on anticoagulation with heparin.  Echo obtained and showed EF of 40% with global left ventricular hypokinesis and moderate LVH.

## 2019-11-22 ENCOUNTER — Telehealth: Payer: Self-pay | Admitting: *Deleted

## 2019-11-22 NOTE — Telephone Encounter (Signed)
Attempted to call for discharge review but one number call could not be completed. The other number voicemail box was full.

## 2019-11-22 NOTE — Telephone Encounter (Signed)
-----   Message from Arvil Chaco, PA-C sent at 11/20/2019  1:16 PM EDT ----- Regarding: Hospital Follow-up Hello,  This patient was admitted and seen at [facility] for unresponsiveness after reported drug overdose.  We are expecting discharge today or tomorrow.  Can you please call and arrange / schedule for follow-up TCM appointment with Dr. Rockey Situ or APP within the next two weeks? He is new to the clinic as well.  Thank you! Signed, Arvil Chaco, PA-C 11/20/2019, 1:17 PM Pager [phone]

## 2019-12-01 NOTE — Telephone Encounter (Signed)
-----   Message from Marykay Lex sent at 11/22/2019  9:30 AM EDT ----- Regarding: FW: Hospital Follow-up LVM for patient to call back and schedule hosp fu ----- Message ----- From: Valora Corporal, RN Sent: 11/22/2019   8:20 AM EDT To: Rebeca Alert Burl Scheduling Subject: FW: Hospital Follow-up                         Patient needs follow up as new patient. Thanks ----- Message ----- From: Arvil Chaco, PA-C Sent: 11/20/2019   1:16 PM EDT To: Sherren Mocha, MD, [place] Bo Triage Subject: Hospital Follow-up                             Hello,  This patient was admitted and seen at [facility] for unresponsiveness after reported drug overdose.  We are expecting discharge today or tomorrow.  Can you please call and arrange / schedule for follow-up TCM appointment with Dr. Rockey Situ or APP within the next two weeks? He is new to the clinic as well.  Thank you! Signed, Arvil Chaco, PA-C 11/20/2019, 1:17 PM Pager [phone]

## 2019-12-01 NOTE — Telephone Encounter (Signed)
Attempted to schedule no ans no vm  

## 2019-12-03 ENCOUNTER — Encounter: Payer: Self-pay | Admitting: *Deleted

## 2019-12-03 NOTE — Telephone Encounter (Signed)
No answer on either number listed and unable to leave message. Letter sent to patient to contact office to schedule appointment at his convenience.

## 2019-12-07 ENCOUNTER — Emergency Department
Admission: EM | Admit: 2019-12-07 | Discharge: 2019-12-07 | Disposition: A | Discharge status: Home / Self Care | Payer: Medicare HMO | Attending: Emergency Medicine | Admitting: Emergency Medicine

## 2019-12-07 ENCOUNTER — Encounter: Payer: Self-pay | Admitting: Medical Oncology

## 2019-12-07 ENCOUNTER — Other Ambulatory Visit: Payer: Self-pay

## 2019-12-07 DIAGNOSIS — R569 Unspecified convulsions: Secondary | ICD-10-CM | POA: Diagnosis present

## 2019-12-07 DIAGNOSIS — F17200 Nicotine dependence, unspecified, uncomplicated: Secondary | ICD-10-CM | POA: Diagnosis not present

## 2019-12-07 DIAGNOSIS — Z760 Encounter for issue of repeat prescription: Secondary | ICD-10-CM | POA: Diagnosis not present

## 2019-12-07 LAB — BASIC METABOLIC PANEL
Anion gap: 14 (ref 5–15)
BUN: 14 mg/dL (ref 6–20)
CO2: 21 mmol/L — ABNORMAL LOW (ref 22–32)
Calcium: 9.5 mg/dL (ref 8.9–10.3)
Chloride: 105 mmol/L (ref 98–111)
Creatinine, Ser: 1.11 mg/dL (ref 0.61–1.24)
GFR calc Af Amer: >60 mL/min (ref >60)
GFR calc non Af Amer: >60 mL/min (ref >60)
Glucose, Bld: 112 mg/dL — ABNORMAL HIGH (ref 70–99)
Potassium: 4.1 mmol/L (ref 3.5–5.1)
Sodium: 140 mmol/L (ref 135–145)

## 2019-12-07 MED ORDER — SODIUM CHLORIDE 0.9 % IV SOLN
40.0000 mg/kg | Freq: Once | INTRAVENOUS | Status: AC
  Administered 2019-12-07: 3200 mg via INTRAVENOUS
  Filled 2019-12-07: qty 32

## 2019-12-07 MED ORDER — LEVETIRACETAM IN NACL 1000 MG/100ML IV SOLN: 1000.0000 mg | Freq: Once | INTRAVENOUS | Status: DC

## 2019-12-07 MED ORDER — LEVETIRACETAM 1000 MG PO TABS: 1000.0000 mg | ORAL_TABLET | Freq: Two times a day (BID) | ORAL | 2 refills | Status: DC

## 2019-12-07 NOTE — ED Provider Notes (Signed)
[facility] Emergency Department Provider Note  ____________________________________________   First MD Initiated Contact with Patient 12/07/19 1046     (approximate)  I have reviewed the triage vital signs and the nursing notes.   HISTORY  Chief Complaint Seizures   HPI Luis Hunter is a 44 y.o. male with a past medical history bipolar disorder, depression, schizophrenia, and seizure disorder who presents via EMS from home after he was witnessed by his girlfriend to have 2 generalized tonic-clonic seizures.  Patient does not recall these events.  Per EMS he received report from family with both seizures lasted less than 1 minute and occurred within 5 minutes of each other.  Patient did not receive any medications from EMS or prior to arrival.  Patient notes he has been out of his Keppra for 2 days.  He states he is otherwise been in his usual state of health without any recent falls or injuries, fevers, chills, cough, nausea, vomiting, diarrhea, dysuria, rash, chest pain, belly pain, or other acute complaints.  States he has a history of breakthrough seizures when he has not taken his Keppra in the past.  He endorses THC use and another unspecified drug last week but no illicit drug use in the last several days.  Denies EtOH abuse or tobacco abuse.         Past Medical History:  Diagnosis Date  . Bipolar disorder ([place])   . Depression   . Schizophrenia ([place])   . Seizures [facility]     Patient Active Problem List   Diagnosis Date Noted  . Polysubstance abuse ([place]) 11/19/2019  . Schizophrenia ([place])   . Bipolar disorder ([place])   . Elevated troponin   . Unresponsiveness   . Tobacco abuse   . Leukocytosis   . Seizure ([place]) 06/05/2017  . BPPV (benign paroxysmal positional vertigo) 11/16/2015  . Dizziness 11/16/2015    Past Surgical History:  Procedure Laterality Date  . FINGER SURGERY      Prior to Admission medications   Medication Sig Start Date  End Date Taking? Authorizing Provider  ARIPiprazole ER (ABILIFY MAINTENA) 400 MG PRSY prefilled syringe Inject 400 mg as directed every 30 (thirty) days.  11/08/15   [provider]  levETIRAcetam (KEPPRA) 1000 MG tablet Take 1 tablet (1,000 mg total) by mouth 2 (two) times daily. 12/07/19   Lucrezia Starch, MD    Allergies Patient has no known allergies.  No family history on file.  Social History Social History   Tobacco Use  . Smoking status: Current Every Day Smoker  . Smokeless tobacco: Never Used  Substance Use Topics  . Alcohol use: No    Alcohol/week: 0.0 standard drinks  . Drug use: Yes    Types: Marijuana, Cocaine    Review of Systems  Review of Systems  Constitutional: Negative for chills and fever.  HENT: Negative for sore throat.   Eyes: Negative for pain.  Respiratory: Negative for cough and stridor.   Cardiovascular: Negative for chest pain.  Gastrointestinal: Negative for vomiting.  Skin: Negative for rash.  Neurological: Positive for seizures. Negative for loss of consciousness and headaches.  Psychiatric/Behavioral: Negative for suicidal ideas.  All other systems reviewed and are negative.     ____________________________________________   PHYSICAL EXAM:  VITAL SIGNS: ED Triage Vitals  Enc Vitals Group     BP      Pulse      Resp      Temp  Temp src      SpO2      Weight      Height      Head Circumference      Peak Flow      Pain Score      Pain Loc      Pain Edu?      Excl. in [place]?    Vitals:   12/07/19 1058 12/07/19 1100  BP: 132/85 (!) 127/96  Pulse: 81 81  Resp: 16 17  Temp: 98.1 F (36.7 C)   SpO2: 100% 97%   Physical Exam Vitals and nursing note reviewed.  Constitutional:      Appearance: He is well-developed.  HENT:     Head: Normocephalic and atraumatic.     Right Ear: External ear normal.     Left Ear: External ear normal.     Nose: Nose normal.     Mouth/Throat:     Mouth: Mucous membranes are  moist.  Eyes:     Conjunctiva/sclera: Conjunctivae normal.  Cardiovascular:     Rate and Rhythm: Normal rate and regular rhythm.     Pulses: Normal pulses.     Heart sounds: No murmur heard.   Pulmonary:     Effort: Pulmonary effort is normal. No respiratory distress.     Breath sounds: Normal breath sounds.  Abdominal:     Palpations: Abdomen is soft.     Tenderness: There is no abdominal tenderness.  Musculoskeletal:     Cervical back: Neck supple.     Right lower leg: No edema.     Left lower leg: No edema.  Skin:    General: Skin is warm and dry.  Neurological:     Mental Status: He is alert and oriented to person, place, and time.  Psychiatric:        Mood and Affect: Mood normal.     PERRLA.  EOMI.  Cranial nerves II through XII grossly intact.  5/5 thanks at all extremities.  Sensation intact light touch in all extremities.  Patient is oriented to place, person, year, and season.  He is not oriented to date. ____________________________________________   LABS (all labs ordered are listed, but only abnormal results are displayed)  Labs Reviewed  BASIC METABOLIC PANEL - Abnormal; Notable for the following components:      Result Value   CO2 21 (*)    Glucose, Bld 112 (*)    All other components within normal limits   ____________________________________________  ____________________________________________  RADIOLOGY   Official radiology report(s): No results found.  ____________________________________________   PROCEDURES  Procedure(s) performed (including Critical Care):  Procedures   ____________________________________________   INITIAL IMPRESSION / ASSESSMENT AND PLAN / ED COURSE        Lateral patient's history, exam, and work-up is consistent with likely breakthrough seizure in the setting of running out of his Keppra. There are no historical or exam findings to suggest an acute infectious etiology, CVA, toxic ingestion, significant  metabolic arrangement, or other clear precipitant. Given nonfocal neuro exam with patient oriented back to baseline he was given Keppra and discharged in stable condition with instructions to follow-up with neurologist. Rx written for Refill of his [place].    Prior to discharge patient was noted to ambulate with steady gait unassisted.       ____________________________________________   FINAL CLINICAL IMPRESSION(S) / ED DIAGNOSES  Final diagnoses:  Seizure ([place])  Medication refill     ED Discharge Orders  Ordered    levETIRAcetam (KEPPRA) 1000 MG tablet  2 times daily     Discontinue  Reprint     12/07/19 1106           Note:  This document was prepared using Dragon voice recognition software and may include unintentional dictation errors.   Lucrezia Starch, MD 12/07/19 1139

## 2019-12-07 NOTE — ED Notes (Signed)
Pt talking to wife on the phone  

## 2019-12-07 NOTE — ED Triage Notes (Signed)
Pt from home via ems with reports of pt having 2 tonic clonic seizures pta. Per pts friend pt had full body jerking movement x 1 min each episode. Pt arrives alert/oriented x 3.

## 2019-12-07 NOTE — ED Notes (Signed)
EDP at bedside  

## 2019-12-07 NOTE — ED Notes (Signed)
Pt ambulatory in room with EDP. Waiting on keppra from [place].

## 2020-03-14 ENCOUNTER — Emergency Department
Admission: EM | Admit: 2020-03-14 | Discharge: 2020-03-14 | Disposition: A | Discharge status: Home / Self Care | Payer: Medicare HMO | Attending: Emergency Medicine | Admitting: Emergency Medicine

## 2020-03-14 ENCOUNTER — Other Ambulatory Visit: Payer: Self-pay

## 2020-03-14 DIAGNOSIS — R569 Unspecified convulsions: Secondary | ICD-10-CM | POA: Diagnosis present

## 2020-03-14 DIAGNOSIS — G40909 Epilepsy, unspecified, not intractable, without status epilepticus: Secondary | ICD-10-CM | POA: Diagnosis not present

## 2020-03-14 DIAGNOSIS — F17200 Nicotine dependence, unspecified, uncomplicated: Secondary | ICD-10-CM | POA: Insufficient documentation

## 2020-03-14 LAB — BASIC METABOLIC PANEL
Anion gap: 14 (ref 5–15)
BUN: 15 mg/dL (ref 6–20)
CO2: 19 mmol/L — ABNORMAL LOW (ref 22–32)
Calcium: 8.6 mg/dL — ABNORMAL LOW (ref 8.9–10.3)
Chloride: 106 mmol/L (ref 98–111)
Creatinine, Ser: 1.22 mg/dL (ref 0.61–1.24)
GFR, Estimated: >60 mL/min (ref >60)
Glucose, Bld: 117 mg/dL — ABNORMAL HIGH (ref 70–99)
Potassium: 3.8 mmol/L (ref 3.5–5.1)
Sodium: 139 mmol/L (ref 135–145)

## 2020-03-14 LAB — CBC WITH DIFFERENTIAL/PLATELET
Abs Immature Granulocytes: 0.06 10*3/uL (ref 0.00–0.07)
Basophils Absolute: 0 10*3/uL (ref 0.0–0.1)
Basophils Relative: 0 %
Eosinophils Absolute: 0.2 10*3/uL (ref 0.0–0.5)
Eosinophils Relative: 3 %
HCT: 41.9 % (ref 39.0–52.0)
Hemoglobin: 13.9 g/dL (ref 13.0–17.0)
Immature Granulocytes: 1 %
Lymphocytes Relative: 19 %
Lymphs Abs: 1.5 10*3/uL (ref 0.7–4.0)
MCH: 30.2 pg (ref 26.0–34.0)
MCHC: 33.2 g/dL (ref 30.0–36.0)
MCV: 90.9 fL (ref 80.0–100.0)
Monocytes Absolute: 0.7 10*3/uL (ref 0.1–1.0)
Monocytes Relative: 9 %
Neutro Abs: 5.4 10*3/uL (ref 1.7–7.7)
Neutrophils Relative %: 68 %
Platelets: 142 10*3/uL — ABNORMAL LOW (ref 150–400)
RBC: 4.61 MIL/uL (ref 4.22–5.81)
RDW: 15.5 % (ref 11.5–15.5)
WBC: 8 10*3/uL (ref 4.0–10.5)
nRBC: 0 % (ref 0.0–0.2)

## 2020-03-14 MED ORDER — LORAZEPAM 2 MG/ML IJ SOLN
INTRAMUSCULAR | Status: AC
  Filled 2020-03-14: qty 1

## 2020-03-14 MED ORDER — HALOPERIDOL LACTATE 5 MG/ML IJ SOLN
INTRAMUSCULAR | Status: AC
  Administered 2020-03-14: 10 mg
  Filled 2020-03-14: qty 2

## 2020-03-14 MED ORDER — SODIUM CHLORIDE 0.9 % IV SOLN
2000.0000 mg | INTRAVENOUS | Status: AC
  Administered 2020-03-14: 2000 mg via INTRAVENOUS
  Filled 2020-03-14: qty 20

## 2020-03-14 MED ORDER — LORAZEPAM 2 MG/ML IJ SOLN
2.0000 mg | Freq: Once | INTRAMUSCULAR | Status: AC
  Administered 2020-03-14: 2 mg via INTRAVENOUS

## 2020-03-14 MED ORDER — LEVETIRACETAM 1000 MG PO TABS: 1000.0000 mg | ORAL_TABLET | Freq: Two times a day (BID) | ORAL | 2 refills | Status: DC

## 2020-03-14 NOTE — ED Notes (Signed)
Patient sleeping but arousalable. Patient given warm blanket at this time.

## 2020-03-14 NOTE — ED Notes (Signed)
Patient resting comfortably. Respirations even and unlabored. No distress at this time. Will continue to monitor.

## 2020-03-14 NOTE — ED Notes (Signed)
Blankets placed along rails in lieu of seizure pads.

## 2020-03-14 NOTE — ED Provider Notes (Signed)
[facility] Emergency Department Provider Note  ____________________________________________  Time seen: Approximately 7:25 AM  I have reviewed the triage vital signs and the nursing notes.   HISTORY  Chief Complaint Seizures    Level 5 Caveat: Portions of the History and Physical including HPI and review of systems are unable to be completely obtained due to patient being a poor historian   HPI Luis Hunter is a 44 y.o. male with a history of seizure disorder, bipolar disorder, schizophrenia, polysubstance abuse  who is brought to the ED due to seizures.  Family called EMS due to 3 seizures lasting about 30 seconds apiece earlier today.  Patient reports he is out of his Keppra, unsure with the current dosing is.  Reports that he has a prescription and plans to pick it up today.  Denies any recent illness, no chest pain shortness of breath fever cough body aches.  No vomiting or diarrhea.  Eating and drinking normally.  Denies cocaine or methamphetamine use, denies headache or trauma.     Past Medical History:  Diagnosis Date  . Bipolar disorder ([place])   . Depression   . Schizophrenia ([place])   . Seizures [facility])      Patient Active Problem List   Diagnosis Date Noted  . Polysubstance abuse ([place]) 11/19/2019  . Schizophrenia ([place])   . Bipolar disorder ([place])   . Elevated troponin   . Unresponsiveness   . Tobacco abuse   . Leukocytosis   . Seizure ([place]) 06/05/2017  . BPPV (benign paroxysmal positional vertigo) 11/16/2015  . Dizziness 11/16/2015     Past Surgical History:  Procedure Laterality Date  . FINGER SURGERY       Prior to Admission medications   Medication Sig Start Date End Date Taking? Authorizing Provider  ARIPiprazole ER (ABILIFY MAINTENA) 400 MG PRSY prefilled syringe Inject 400 mg as directed every 30 (thirty) days.  11/08/15   [provider]  levETIRAcetam (KEPPRA) 1000 MG tablet Take 1 tablet (1,000 mg total) by mouth 2  (two) times daily. 03/14/20   Carrie Mew, MD     Allergies Patient has no known allergies.   No family history on file.  Social History Social History   Tobacco Use  . Smoking status: Current Every Day Smoker  . Smokeless tobacco: Never Used  Substance Use Topics  . Alcohol use: No    Alcohol/week: 0.0 standard drinks  . Drug use: Yes    Types: Marijuana, Cocaine    Review of Systems Level 5 Caveat: Portions of the History and Physical including HPI and review of systems are unable to be completely obtained due to patient being a poor historian   Constitutional:   No known fever.  ENT:   No rhinorrhea. Cardiovascular:   No chest pain or syncope. Respiratory:   No dyspnea or cough. Gastrointestinal:   Negative for abdominal pain, vomiting and diarrhea.  Musculoskeletal:   Negative for focal pain or swelling ____________________________________________   PHYSICAL EXAM:  VITAL SIGNS: ED Triage Vitals [03/14/20 0722]  Enc Vitals Group     BP 122/84     Pulse Rate 86     Resp 16     Temp 97.6 F (36.4 C)     Temp Source Oral     SpO2 97 %     Weight 180 lb (81.6 kg)     Height 6' (1.829 m)     Head Circumference      Peak Flow  Pain Score 0     Pain Loc      Pain Edu?      Excl. in [place]?     Vital signs reviewed, nursing assessments reviewed.   Constitutional:   Alert and oriented. Non-toxic appearance. Eyes:   Conjunctivae are normal. EOMI. PERRL. ENT      Head:   Normocephalic and atraumatic.      Nose:   No congestion/rhinnorhea.       Mouth/Throat:   MMM, no pharyngeal erythema. No peritonsillar mass.       Neck:   No meningismus. Full ROM. Hematological/Lymphatic/Immunilogical:   No cervical lymphadenopathy. Cardiovascular:   RRR. Symmetric bilateral radial and DP pulses.  No murmurs. Cap refill less than 2 seconds. Respiratory:   Normal respiratory effort without tachypnea/retractions. Breath sounds are clear and equal bilaterally. No  wheezes/rales/rhonchi. Gastrointestinal:   Soft and nontender. Non distended. There is no CVA tenderness.  No rebound, rigidity, or guarding. Musculoskeletal:   Normal range of motion in all extremities. No joint effusions.  No lower extremity tenderness.  No edema. Neurologic:   Normal speech and language.  Motor grossly intact.  Normal strength and coordination No acute focal neurologic deficits are appreciated.  Skin:    Skin is warm, dry and intact. No rash noted.  No petechiae, purpura, or bullae.  ____________________________________________    LABS (pertinent positives/negatives) (all labs ordered are listed, but only abnormal results are displayed) Labs Reviewed  BASIC METABOLIC PANEL - Abnormal; Notable for the following components:      Result Value   CO2 19 (*)    Glucose, Bld 117 (*)    Calcium 8.6 (*)    All other components within normal limits  CBC WITH DIFFERENTIAL/PLATELET - Abnormal; Notable for the following components:   Platelets 142 (*)    All other components within normal limits   ____________________________________________   EKG    ____________________________________________    RADIOLOGY  No results found.  ____________________________________________   PROCEDURES .Critical Care Performed by: Carrie Mew, MD Authorized by: Carrie Mew, MD   Critical care provider statement:    Critical care time (minutes):  35   Critical care time was exclusive of:  Separately billable procedures and treating other patients   Critical care was necessary to treat or prevent imminent or life-threatening deterioration of the following conditions:  CNS failure or compromise   Critical care was time spent personally by me on the following activities:  Development of treatment plan with patient or surrogate, discussions with consultants, evaluation of patient's response to treatment, examination of patient, obtaining history from patient or  surrogate, ordering and performing treatments and interventions, ordering and review of laboratory studies, ordering and review of radiographic studies, pulse oximetry, re-evaluation of patient's condition and review of old charts    ____________________________________________    CLINICAL IMPRESSION / [place] / ED COURSE  Medications ordered in the ED: Medications  levETIRAcetam (KEPPRA) 2,000 mg in sodium chloride 0.9 % 250 mL IVPB (0 mg Intravenous Stopped 03/14/20 0927)  LORazepam (ATIVAN) injection 2 mg (2 mg Intravenous Given 03/14/20 0838)  LORazepam (ATIVAN) injection 2 mg (2 mg Intravenous Given 03/14/20 0913)  haloperidol lactate (HALDOL) 5 MG/ML injection (10 mg  Given 03/14/20 0919)    Pertinent labs & imaging results that were available during my care of the patient were reviewed by me and considered in my medical decision making (see chart for details).   Luis Hunter was evaluated in Emergency  Department on 03/14/2020 for the symptoms described in the history of present illness. He was evaluated in the context of the global COVID-19 pandemic, which necessitated consideration that the patient might be at risk for infection with the SARS-CoV-2 virus that causes COVID-19. Institutional protocols and algorithms that pertain to the evaluation of patients at risk for COVID-19 are in a state of rapid change based on information released by regulatory bodies including the CDC and federal and state organizations. These policies and algorithms were followed during the patient's care in the ED.   Patient presents with multiple brief seizures today in the setting of medication noncompliance.  Vital signs are normal, exam is reassuring with nonfocal neurologic examination.  Will check labs to ensure electrolytes and blood counts are okay.  According to review of electronic medical record, patient's current Keppra dosing is 1000 mg twice daily so I will give him a 2000 mg IV load.  I  will provide a new prescription to ensure that he has access to his medication.  With Keppra loading and reassuring labs, if he is not having additional seizures he will be stable for discharge and outpatient follow-up.   Clinical Course as of Mar 14 1528  Tue Mar 14, 2020  0932 Patient had another seizure at 27, Keppra had not arrived by that time.  He was given 2 mg of IV Ativan and the seizure abated within 1 minute, and he has remained in a postictal state since then.  He has required an additional 2 mg of IV Ativan and 10 mg of IV Haldol due to confusion and inability to make safe decisions.  He attempted to dive off the side of the bed, grab medical equipment in the room near the bed, and bite his IV.  He is requiring close attention due to confusion and postictal state.   [PS]  1300 Pt voided in toilet. Unsteady on feet, will continue to observe in ED until sober. No further seizures since keppra infusion.    [PS]    Clinical Course User Index [PS] Carrie Mew, MD   ----------------------------------------- 3:29 PM on 03/14/2020 -----------------------------------------  Still confused, no lucid and steady yet.  Will continue ED observation.    ____________________________________________   FINAL CLINICAL IMPRESSION(S) / ED DIAGNOSES    Final diagnoses:  Seizure disorder [facility])     ED Discharge Orders         Ordered    levETIRAcetam (KEPPRA) 1000 MG tablet  2 times daily        03/14/20 1529          Portions of this note were generated with dragon dictation software. Dictation errors may occur despite best attempts at proofreading.   Carrie Mew, MD 03/14/20 1530

## 2020-03-14 NOTE — ED Notes (Addendum)
While awaiting medication from pharmacy, RN entered room to find patient with seizure like activity lasting >1 minute. MD to bedside. Verbal for 2mg  ativan administered. Pt disoriented, combative afterwards, pulling at lines, attempting to get out of bed.

## 2020-03-14 NOTE — ED Triage Notes (Signed)
EMS called out by patient family. Family reports 3 seizures lasting appox 30 sec a piece. Reports being out of meds at home. 2 mg versed en route

## 2020-03-14 NOTE — Discharge Instructions (Addendum)
Please continue your Keppra at home.  Is important that you are compliant with your antiseizure medicine.  Return to the ED with any recurrent seizures or worsening symptoms.

## 2020-03-14 NOTE — ED Notes (Signed)
Pt remains combative, disoriented, not responding to commands. Attempting to get out of bed. MD at bedside.

## 2020-03-14 NOTE — ED Notes (Signed)
Awaiting medication from pharmacy.

## 2020-03-14 NOTE — ED Notes (Signed)
Patient resting comfortably, still removing BP cuff. Other VSS. Respirations even and unlabored

## 2020-03-14 NOTE — ED Provider Notes (Signed)
Patient signed out to me by Dr. Joni Fears pending return to baseline mental status after multiple seizures and requiring hefty doses of Ativan.  Patient observed for multiple hours throughout my shift, intermittently getting out of bed to void.  Around 8 PM, patient ambulates out of his room and requests discharge.  Indicates that he feels well , and he is ambulatory with a normal gait.  I spoke with his brother over the phone who is on the way to get him, and expresses comfort picking the patient up.  Patient reports that he has Keppra at home and I urged him to take this medication to prevent further episodes such as this.  We discussed return precautions for the ED and following up with his neurologist.  Patient medically stable for discharge   Luis Crofts, MD 03/14/20 2148

## 2020-07-12 ENCOUNTER — Emergency Department
Admission: EM | Admit: 2020-07-12 | Discharge: 2020-07-12 | Disposition: A | Discharge status: Home / Self Care | Payer: Medicare HMO | Attending: Emergency Medicine | Admitting: Emergency Medicine

## 2020-07-12 ENCOUNTER — Encounter: Payer: Self-pay | Admitting: Emergency Medicine

## 2020-07-12 ENCOUNTER — Other Ambulatory Visit: Payer: Self-pay

## 2020-07-12 DIAGNOSIS — Z79899 Other long term (current) drug therapy: Secondary | ICD-10-CM | POA: Diagnosis not present

## 2020-07-12 DIAGNOSIS — G40909 Epilepsy, unspecified, not intractable, without status epilepticus: Secondary | ICD-10-CM | POA: Insufficient documentation

## 2020-07-12 DIAGNOSIS — R569 Unspecified convulsions: Secondary | ICD-10-CM | POA: Diagnosis present

## 2020-07-12 DIAGNOSIS — F17200 Nicotine dependence, unspecified, uncomplicated: Secondary | ICD-10-CM | POA: Diagnosis not present

## 2020-07-12 LAB — COMPREHENSIVE METABOLIC PANEL
ALT: 16 U/L (ref 0–44)
AST: 32 U/L (ref 15–41)
Albumin: 3.2 g/dL — ABNORMAL LOW (ref 3.5–5.0)
Alkaline Phosphatase: 52 U/L (ref 38–126)
Anion gap: 17 — ABNORMAL HIGH (ref 5–15)
BUN: 23 mg/dL — ABNORMAL HIGH (ref 6–20)
CO2: 14 mmol/L — ABNORMAL LOW (ref 22–32)
Calcium: 8.2 mg/dL — ABNORMAL LOW (ref 8.9–10.3)
Chloride: 109 mmol/L (ref 98–111)
Creatinine, Ser: 1.31 mg/dL — ABNORMAL HIGH (ref 0.61–1.24)
GFR, Estimated: >60 mL/min (ref >60)
Glucose, Bld: 158 mg/dL — ABNORMAL HIGH (ref 70–99)
Potassium: 4 mmol/L (ref 3.5–5.1)
Sodium: 140 mmol/L (ref 135–145)
Total Bilirubin: 0.5 mg/dL (ref 0.3–1.2)
Total Protein: 5.5 g/dL — ABNORMAL LOW (ref 6.5–8.1)

## 2020-07-12 LAB — CBC
HCT: 41.7 % (ref 39.0–52.0)
Hemoglobin: 13.1 g/dL (ref 13.0–17.0)
MCH: 29.7 pg (ref 26.0–34.0)
MCHC: 31.4 g/dL (ref 30.0–36.0)
MCV: 94.6 fL (ref 80.0–100.0)
Platelets: 193 10*3/uL (ref 150–400)
RBC: 4.41 MIL/uL (ref 4.22–5.81)
RDW: 15.6 % — ABNORMAL HIGH (ref 11.5–15.5)
WBC: 13.7 10*3/uL — ABNORMAL HIGH (ref 4.0–10.5)
nRBC: 0 % (ref 0.0–0.2)

## 2020-07-12 LAB — ETHANOL: Alcohol, Ethyl (B): <10 mg/dL (ref <10)

## 2020-07-12 LAB — CBG MONITORING, ED: Glucose-Capillary: 154 mg/dL — ABNORMAL HIGH (ref 70–99)

## 2020-07-12 MED ORDER — SODIUM CHLORIDE 0.9 % IV BOLUS
1000.0000 mL | Freq: Once | INTRAVENOUS | Status: AC
  Administered 2020-07-12: 1000 mL via INTRAVENOUS

## 2020-07-12 MED ORDER — LEVETIRACETAM IN NACL 1500 MG/100ML IV SOLN
1500.0000 mg | Freq: Once | INTRAVENOUS | Status: AC
  Administered 2020-07-12: 1500 mg via INTRAVENOUS
  Filled 2020-07-12: qty 100

## 2020-07-12 NOTE — ED Notes (Signed)
Pt given phone to call for 12:00 ride home.

## 2020-07-12 NOTE — ED Provider Notes (Signed)
[facility] Emergency Department Provider Note  Time seen: 9:04 AM  I have reviewed the triage vital signs and the nursing notes.   HISTORY  Chief Complaint Seizures   HPI Luis Hunter is a 45 y.o. male with a past medical history of bipolar, schizophrenia, substance abuse, seizure disorder presents to the emergency department for reported seizure.   According to report patient is coming from home after he was witnessed to have a seizure per family member.  Patient appears to be on Keppra.  EMS states patient was seizing when they arrived and was given 4 mg of IV Versed.  No seizures since.  On arrival patient is somnolent, making purposeful movements, pulling the blankets over his head, but not answering questions or following commands.  Past Medical History:  Diagnosis Date  . Bipolar disorder ([place])   . Depression   . Schizophrenia ([place])   . Seizures [facility])     Patient Active Problem List   Diagnosis Date Noted  . Polysubstance abuse ([place]) 11/19/2019  . Schizophrenia ([place])   . Bipolar disorder ([place])   . Elevated troponin   . Unresponsiveness   . Tobacco abuse   . Leukocytosis   . Seizure ([place]) 06/05/2017  . BPPV (benign paroxysmal positional vertigo) 11/16/2015  . Dizziness 11/16/2015    Past Surgical History:  Procedure Laterality Date  . FINGER SURGERY      Prior to Admission medications   Medication Sig Start Date End Date Taking? Authorizing Provider  ARIPiprazole ER (ABILIFY MAINTENA) 400 MG PRSY prefilled syringe Inject 400 mg as directed every 30 (thirty) days.  11/08/15   [provider]  levETIRAcetam (KEPPRA) 1000 MG tablet Take 1 tablet (1,000 mg total) by mouth 2 (two) times daily. 03/14/20   Carrie Mew, MD    No Known Allergies  No family history on file.  Social History Social History   Tobacco Use  . Smoking status: Current Every Day Smoker  . Smokeless tobacco: Never Used  Substance Use Topics  . Alcohol  use: No    Alcohol/week: 0.0 standard drinks  . Drug use: Yes    Types: Marijuana, Cocaine    Review of Systems Unable to obtain an adequate/accurate review of systems secondary to altered mental status/postictal state  ____________________________________________   PHYSICAL EXAM:  Constitutional: Patient is somnolent but makes purposeful movements.  Rolls over in bed.  Covers his head with a blanket.  No seizure activity. Eyes: Normal exam ENT      Head: Normocephalic and atraumatic.      Mouth/Throat: Mucous membranes are moist. Cardiovascular: Normal rate, regular rhythm. Respiratory: Normal respiratory effort without tachypnea nor retractions. Breath sounds are clear Gastrointestinal: Soft and nontender. No distention.   Musculoskeletal: Moves all extremities.  Appears atraumatic. Neurologic: Patient is somnolent but moves all extremities well.  Unable to complete full neurologic exam due to mental state. Skin:  Skin is warm, dry and intact.  Psychiatric: Mood and affect are normal.  ____________________________________________    EKG  EKG viewed and interpreted by myself shows a normal sinus rhythm at 96 bpm with a narrow QRS, normal axis, normal intervals, no concerning ST changes.  ____________________________________________   INITIAL IMPRESSION / ASSESSMENT AND PLAN / ED COURSE  Pertinent labs & imaging results that were available during my care of the patient were reviewed by me and considered in my medical decision making (see chart for details).   Patient presents emergency department after a seizure at home.  Patient has a history of seizures, per record review appears to take [place].  Is unclear if the patient has been taking it as prescribed although family states he has.  Patient also has a history of polysubstance abuse per record review.  We will check labs, load with IV Keppra and IV hydrate while awaiting results.  Patient is somnolent but makes purposeful  movements, no concern for ongoing seizures, likely postictal and post Versed contributing to altered mental state.  Patient is now much more alert.  Sleeps but awakens to voice and answers questions appropriately.  Is asking if he can go home.  Lab work is largely nonrevealing/baseline for the patient besides dehydration.  Patient receiving fluids and was loaded with IV Keppra.  We will watch for another hour or so if patient continues to appear well with no further seizure activity I believe the patient would be safe for discharge home with his ride.  Luis Hunter was evaluated in Emergency Department on 07/12/2020 for the symptoms described in the history of present illness. He was evaluated in the context of the global COVID-19 pandemic, which necessitated consideration that the patient might be at risk for infection with the SARS-CoV-2 virus that causes COVID-19. Institutional protocols and algorithms that pertain to the evaluation of patients at risk for COVID-19 are in a state of rapid change based on information released by regulatory bodies including the CDC and federal and state organizations. These policies and algorithms were followed during the patient's care in the ED.  ____________________________________________   FINAL CLINICAL IMPRESSION(S) / ED DIAGNOSES  Seizure   Harvest Dark, MD 07/12/20 1058

## 2020-07-12 NOTE — ED Notes (Signed)
Pt awake, following commands, answers questions appropriately

## 2020-07-12 NOTE — ED Triage Notes (Signed)
Pt ems from home for seizure. Pt with hx seizure. Pt having seizure when ems arrived. Ems gave 4 mg iv versed in route. cbg 200. Per ems family said that pt was compliant with medication.

## 2020-10-05 ENCOUNTER — Other Ambulatory Visit: Payer: Self-pay

## 2020-10-05 ENCOUNTER — Emergency Department
Admission: EM | Admit: 2020-10-05 | Discharge: 2020-10-05 | Disposition: A | Discharge status: Home / Self Care | Payer: Medicare HMO | Attending: Emergency Medicine | Admitting: Emergency Medicine

## 2020-10-05 ENCOUNTER — Encounter: Payer: Self-pay | Admitting: Emergency Medicine

## 2020-10-05 DIAGNOSIS — G40919 Epilepsy, unspecified, intractable, without status epilepticus: Secondary | ICD-10-CM

## 2020-10-05 DIAGNOSIS — R569 Unspecified convulsions: Secondary | ICD-10-CM | POA: Insufficient documentation

## 2020-10-05 DIAGNOSIS — F17200 Nicotine dependence, unspecified, uncomplicated: Secondary | ICD-10-CM | POA: Insufficient documentation

## 2020-10-05 LAB — BASIC METABOLIC PANEL
Anion gap: 11 (ref 5–15)
BUN: 14 mg/dL (ref 6–20)
CO2: 23 mmol/L (ref 22–32)
Calcium: 9.2 mg/dL (ref 8.9–10.3)
Chloride: 105 mmol/L (ref 98–111)
Creatinine, Ser: 1.28 mg/dL — ABNORMAL HIGH (ref 0.61–1.24)
GFR, Estimated: >60 mL/min (ref >60)
Glucose, Bld: 119 mg/dL — ABNORMAL HIGH (ref 70–99)
Potassium: 3.8 mmol/L (ref 3.5–5.1)
Sodium: 139 mmol/L (ref 135–145)

## 2020-10-05 LAB — CBC
HCT: 45 % (ref 39.0–52.0)
Hemoglobin: 15.4 g/dL (ref 13.0–17.0)
MCH: 30.3 pg (ref 26.0–34.0)
MCHC: 34.2 g/dL (ref 30.0–36.0)
MCV: 88.4 fL (ref 80.0–100.0)
Platelets: 184 10*3/uL (ref 150–400)
RBC: 5.09 MIL/uL (ref 4.22–5.81)
RDW: 14.7 % (ref 11.5–15.5)
WBC: 11.5 10*3/uL — ABNORMAL HIGH (ref 4.0–10.5)
nRBC: 0 % (ref 0.0–0.2)

## 2020-10-05 MED ORDER — SODIUM CHLORIDE 0.9 % IV BOLUS
1000.0000 mL | Freq: Once | INTRAVENOUS | Status: AC
  Administered 2020-10-05: 1000 mL via INTRAVENOUS

## 2020-10-05 MED ORDER — LORAZEPAM 2 MG/ML IJ SOLN
1.0000 mg | Freq: Once | INTRAMUSCULAR | Status: AC
  Administered 2020-10-05: 1 mg via INTRAVENOUS
  Filled 2020-10-05: qty 1

## 2020-10-05 MED ORDER — LEVETIRACETAM IN NACL 1000 MG/100ML IV SOLN
1000.0000 mg | Freq: Once | INTRAVENOUS | Status: AC
  Administered 2020-10-05: 1000 mg via INTRAVENOUS
  Filled 2020-10-05: qty 100

## 2020-10-05 MED ORDER — LEVETIRACETAM 750 MG PO TABS: 1500.0000 mg | ORAL_TABLET | Freq: Two times a day (BID) | ORAL | 2 refills | Status: DC

## 2020-10-05 MED ORDER — LEVETIRACETAM 500 MG PO TABS
1000.0000 mg | ORAL_TABLET | Freq: Once | ORAL | Status: DC
  Filled 2020-10-05: qty 2

## 2020-10-05 NOTE — ED Provider Notes (Signed)
[facility] Emergency Department Provider Note  ____________________________________________   Event Date/Time   First MD Initiated Contact with Patient 10/05/20 1127     (approximate)  I have reviewed the triage vital signs and the nursing notes.   HISTORY  Chief Complaint Seizures    HPI Luis Hunter is a 45 y.o. male with history of bipolar disorder, seizures, here with witnessed seizure.  Patient states that he had a seizure this afternoon.  He states this happens about once a month.  He states has been taking his Keppra as prescribed.  Denies any other recent changes in health.  He has had no recent headache, fevers, or infectious symptoms.  No other complaints.  Denies any recent trauma.  No headaches.   Remainder of history limited due to seizure and mild postictal confusion.       Past Medical History:  Diagnosis Date  . Bipolar disorder ([place])   . Depression   . Schizophrenia ([place])   . Seizures Luis Hunter)     Patient Active Problem List   Diagnosis Date Noted  . Polysubstance abuse ([place]) 11/19/2019  . Schizophrenia ([place])   . Bipolar disorder ([place])   . Elevated troponin   . Unresponsiveness   . Tobacco abuse   . Leukocytosis   . Seizure ([place]) 06/05/2017  . BPPV (benign paroxysmal positional vertigo) 11/16/2015  . Dizziness 11/16/2015    Past Surgical History:  Procedure Laterality Date  . FINGER SURGERY      Prior to Admission medications   Medication Sig Start Date End Date Taking? Authorizing Provider  ARIPiprazole ER (ABILIFY MAINTENA) 400 MG PRSY prefilled syringe Inject 400 mg as directed every 30 (thirty) days.  11/08/15   [provider]  levETIRAcetam (KEPPRA) 1000 MG tablet Take 1 tablet (1,000 mg total) by mouth 2 (two) times daily. 03/14/20   Carrie Mew, MD    Allergies Patient has no known allergies.  History reviewed. No pertinent family history.  Social History Social History   Tobacco Use  .  Smoking status: Current Every Day Smoker  . Smokeless tobacco: Never Used  Substance Use Topics  . Alcohol use: No    Alcohol/week: 0.0 standard drinks  . Drug use: Yes    Types: Marijuana, Cocaine    Review of Systems  Review of Systems  Constitutional: Negative for chills and fever.  HENT: Negative for sore throat.   Respiratory: Negative for shortness of breath.   Cardiovascular: Negative for chest pain.  Gastrointestinal: Negative for abdominal pain.  Genitourinary: Negative for flank pain.  Musculoskeletal: Negative for neck pain.  Skin: Negative for rash and wound.  Allergic/Immunologic: Negative for immunocompromised state.  Neurological: Positive for seizures. Negative for weakness and numbness.  Hematological: Does not bruise/bleed easily.  All other systems reviewed and are negative.    ____________________________________________  PHYSICAL EXAM:      VITAL SIGNS: ED Triage Vitals  Enc Vitals Group     BP 10/05/20 0910 106/85     Pulse Rate 10/05/20 0910 95     Resp 10/05/20 0910 18     Temp 10/05/20 0910 97.9 F (36.6 C)     Temp Source 10/05/20 0910 Oral     SpO2 10/05/20 0910 98 %     Weight 10/05/20 0907 180 lb (81.6 kg)     Height 10/05/20 0907 5\' 11"  (1.803 m)     Head Circumference --      Peak Flow --  Pain Score 10/05/20 0907 0     Pain Loc --      Pain Edu? --      Excl. in [place]? --      Physical Exam Vitals and nursing note reviewed.  Constitutional:      General: He is not in acute distress.    Appearance: He is well-developed.  HENT:     Head: Normocephalic and atraumatic.  Eyes:     Conjunctiva/sclera: Conjunctivae normal.  Cardiovascular:     Rate and Rhythm: Normal rate and regular rhythm.     Heart sounds: Normal heart sounds. No murmur heard. No friction rub.  Pulmonary:     Effort: Pulmonary effort is normal. No respiratory distress.     Breath sounds: Normal breath sounds. No wheezing or rales.  Abdominal:     General:  There is no distension.     Palpations: Abdomen is soft.     Tenderness: There is no abdominal tenderness.  Musculoskeletal:     Cervical back: Neck supple.  Skin:    General: Skin is warm.     Capillary Refill: Capillary refill takes less than 2 seconds.  Neurological:     Mental Status: He is alert and oriented to person, place, and time.     Motor: No abnormal muscle tone.     Comments: Moves all extremities with 5 and 5 strength.  Normal sensation to light touch in bilateral upper and lower extremities.  Crown nerves intact.  No seizure-like activity.  Ambulatory without difficulty.       ____________________________________________   LABS (all labs ordered are listed, but only abnormal results are displayed)  Labs Reviewed  BASIC METABOLIC PANEL - Abnormal; Notable for the following components:      Result Value   Glucose, Bld 119 (*)    Creatinine, Ser 1.28 (*)    All other components within normal limits  CBC - Abnormal; Notable for the following components:   WBC 11.5 (*)    All other components within normal limits  LEVETIRACETAM LEVEL    ____________________________________________  EKG:  ________________________________________  RADIOLOGY All imaging, including plain films, CT scans, and ultrasounds, independently reviewed by me, and interpretations confirmed via formal radiology reads.  ED MD interpretation:     Official radiology report(s): No results found.  ____________________________________________  PROCEDURES   Procedure(s) performed (including Critical Care):  Procedures  ____________________________________________  INITIAL IMPRESSION / MDM / [place] / ED COURSE  As part of my medical decision making, I reviewed the following data within the [place] notes reviewed and incorporated, Old chart reviewed, Notes from prior ED visits, and [place] Controlled Substance Database       *Luis Hunter was  evaluated in Emergency Department on 10/05/2020 for the symptoms described in the history of present illness. He was evaluated in the context of the global COVID-19 pandemic, which necessitated consideration that the patient might be at risk for infection with the SARS-CoV-2 virus that causes COVID-19. Institutional protocols and algorithms that pertain to the evaluation of patients at risk for COVID-19 are in a state of rapid change based on information released by regulatory bodies including the CDC and federal and state organizations. These policies and algorithms were followed during the patient's care in the ED.  Some ED evaluations and interventions may be delayed as a result of limited staffing during the pandemic.*     Medical Decision Making: 45 year old male with history of schizophrenia and  seizures here with seizure-like activity.  History of similar episodes.  He states he has been adherent with his Keppra.  Last level was checked over a year ago but did seem to be therapeutic.  Otherwise, no apparent infectious or other triggers.  He is at his mental baseline now.  No further seizure-like activities with monitoring in the ED.  Lab work shows mild reactive leukocytosis, but he denies any fevers or recent illness.  Discussed case with neurology.  Will start on an increased dose of Keppra 1500 twice a day.  He was given an IV dose here.  Will have him follow-up with neurology as an outpatient.  No other apparent emergent pathology.  ____________________________________________  FINAL CLINICAL IMPRESSION(S) / ED DIAGNOSES  Final diagnoses:  Breakthrough seizure ([place])     MEDICATIONS GIVEN DURING THIS VISIT:  Medications  levETIRAcetam (KEPPRA) IVPB 1000 mg/100 mL premix (has no administration in time range)  sodium chloride 0.9 % bolus 1,000 mL (1,000 mLs Intravenous New Bag/Given 10/05/20 1211)  LORazepam (ATIVAN) injection 1 mg (1 mg Intravenous Given 10/05/20 1212)     ED Discharge  Orders    None       Note:  This document was prepared using Dragon voice recognition software and may include unintentional dictation errors.   Duffy Bruce, MD 10/05/20 [phone]

## 2020-10-05 NOTE — ED Notes (Signed)
Patient calling ride home, provided with paper scrubs for discharge.

## 2020-10-05 NOTE — ED Notes (Signed)
Upon entering room patient noted to be vomiting, MD notified.

## 2020-10-05 NOTE — ED Triage Notes (Signed)
Pt comes into the ED via [facility] from home c/o witnessed seizure for approx 5 minutes.  Pt was postictal upon EMS arrival, all VSS with EMS.

## 2020-10-08 LAB — LEVETIRACETAM LEVEL: Levetiracetam Lvl: 21 ug/mL (ref 10.0–40.0)

## 2020-12-16 ENCOUNTER — Emergency Department: Payer: Medicare HMO

## 2020-12-16 ENCOUNTER — Inpatient Hospital Stay (HOSPITAL_COMMUNITY)
Admission: AD | Admit: 2020-12-16 | Discharge: 2021-01-16 | DRG: 004 | Disposition: A | Discharge status: Other Acute Inpatient Hospital | Payer: Medicare HMO | Source: Other Acute Inpatient Hospital | Attending: Pulmonary Disease | Admitting: Pulmonary Disease

## 2020-12-16 ENCOUNTER — Inpatient Hospital Stay (HOSPITAL_COMMUNITY): Payer: Medicare HMO

## 2020-12-16 ENCOUNTER — Emergency Department
Admission: EM | Admit: 2020-12-16 | Discharge: 2020-12-16 | Disposition: A | Discharge status: Other Acute Inpatient Hospital | Payer: Medicare HMO | Attending: Emergency Medicine | Admitting: Emergency Medicine

## 2020-12-16 DIAGNOSIS — I2109 ST elevation (STEMI) myocardial infarction involving other coronary artery of anterior wall: Secondary | ICD-10-CM | POA: Diagnosis not present

## 2020-12-16 DIAGNOSIS — I952 Hypotension due to drugs: Secondary | ICD-10-CM

## 2020-12-16 DIAGNOSIS — L8990 Pressure ulcer of unspecified site, unspecified stage: Secondary | ICD-10-CM | POA: Insufficient documentation

## 2020-12-16 DIAGNOSIS — Z9911 Dependence on respirator [ventilator] status: Secondary | ICD-10-CM | POA: Diagnosis not present

## 2020-12-16 DIAGNOSIS — D638 Anemia in other chronic diseases classified elsewhere: Secondary | ICD-10-CM | POA: Diagnosis present

## 2020-12-16 DIAGNOSIS — L309 Dermatitis, unspecified: Secondary | ICD-10-CM | POA: Diagnosis not present

## 2020-12-16 DIAGNOSIS — Z23 Encounter for immunization: Secondary | ICD-10-CM | POA: Diagnosis present

## 2020-12-16 DIAGNOSIS — N179 Acute kidney failure, unspecified: Secondary | ICD-10-CM | POA: Diagnosis not present

## 2020-12-16 DIAGNOSIS — R569 Unspecified convulsions: Secondary | ICD-10-CM | POA: Diagnosis present

## 2020-12-16 DIAGNOSIS — J9600 Acute respiratory failure, unspecified whether with hypoxia or hypercapnia: Secondary | ICD-10-CM

## 2020-12-16 DIAGNOSIS — E872 Acidosis: Secondary | ICD-10-CM | POA: Diagnosis not present

## 2020-12-16 DIAGNOSIS — I110 Hypertensive heart disease with heart failure: Secondary | ICD-10-CM | POA: Diagnosis present

## 2020-12-16 DIAGNOSIS — J9601 Acute respiratory failure with hypoxia: Secondary | ICD-10-CM | POA: Diagnosis not present

## 2020-12-16 DIAGNOSIS — J80 Acute respiratory distress syndrome: Secondary | ICD-10-CM | POA: Diagnosis present

## 2020-12-16 DIAGNOSIS — F209 Schizophrenia, unspecified: Secondary | ICD-10-CM | POA: Diagnosis present

## 2020-12-16 DIAGNOSIS — G9341 Metabolic encephalopathy: Secondary | ICD-10-CM | POA: Diagnosis not present

## 2020-12-16 DIAGNOSIS — K5900 Constipation, unspecified: Secondary | ICD-10-CM

## 2020-12-16 DIAGNOSIS — F17200 Nicotine dependence, unspecified, uncomplicated: Secondary | ICD-10-CM | POA: Insufficient documentation

## 2020-12-16 DIAGNOSIS — E876 Hypokalemia: Secondary | ICD-10-CM | POA: Diagnosis present

## 2020-12-16 DIAGNOSIS — F1910 Other psychoactive substance abuse, uncomplicated: Secondary | ICD-10-CM | POA: Diagnosis not present

## 2020-12-16 DIAGNOSIS — J9690 Respiratory failure, unspecified, unspecified whether with hypoxia or hypercapnia: Secondary | ICD-10-CM

## 2020-12-16 DIAGNOSIS — J398 Other specified diseases of upper respiratory tract: Secondary | ICD-10-CM

## 2020-12-16 DIAGNOSIS — N170 Acute kidney failure with tubular necrosis: Secondary | ICD-10-CM | POA: Diagnosis not present

## 2020-12-16 DIAGNOSIS — G9340 Encephalopathy, unspecified: Secondary | ICD-10-CM | POA: Diagnosis not present

## 2020-12-16 DIAGNOSIS — I213 ST elevation (STEMI) myocardial infarction of unspecified site: Secondary | ICD-10-CM | POA: Diagnosis not present

## 2020-12-16 DIAGNOSIS — I5022 Chronic systolic (congestive) heart failure: Secondary | ICD-10-CM | POA: Diagnosis present

## 2020-12-16 DIAGNOSIS — G40901 Epilepsy, unspecified, not intractable, with status epilepticus: Principal | ICD-10-CM | POA: Diagnosis present

## 2020-12-16 DIAGNOSIS — G928 Other toxic encephalopathy: Secondary | ICD-10-CM | POA: Diagnosis not present

## 2020-12-16 DIAGNOSIS — R7989 Other specified abnormal findings of blood chemistry: Secondary | ICD-10-CM

## 2020-12-16 DIAGNOSIS — R000 Tachycardia, unspecified: Secondary | ICD-10-CM | POA: Diagnosis not present

## 2020-12-16 DIAGNOSIS — J156 Pneumonia due to other aerobic Gram-negative bacteria: Secondary | ICD-10-CM | POA: Diagnosis not present

## 2020-12-16 DIAGNOSIS — R6521 Severe sepsis with septic shock: Secondary | ICD-10-CM | POA: Diagnosis not present

## 2020-12-16 DIAGNOSIS — Z20822 Contact with and (suspected) exposure to covid-19: Secondary | ICD-10-CM | POA: Diagnosis not present

## 2020-12-16 DIAGNOSIS — F1410 Cocaine abuse, uncomplicated: Secondary | ICD-10-CM | POA: Diagnosis present

## 2020-12-16 DIAGNOSIS — R0603 Acute respiratory distress: Secondary | ICD-10-CM

## 2020-12-16 DIAGNOSIS — Z930 Tracheostomy status: Secondary | ICD-10-CM

## 2020-12-16 DIAGNOSIS — R1310 Dysphagia, unspecified: Secondary | ICD-10-CM

## 2020-12-16 DIAGNOSIS — J45901 Unspecified asthma with (acute) exacerbation: Secondary | ICD-10-CM | POA: Diagnosis not present

## 2020-12-16 DIAGNOSIS — Y95 Nosocomial condition: Secondary | ICD-10-CM | POA: Diagnosis not present

## 2020-12-16 DIAGNOSIS — Z452 Encounter for adjustment and management of vascular access device: Secondary | ICD-10-CM

## 2020-12-16 DIAGNOSIS — J189 Pneumonia, unspecified organism: Secondary | ICD-10-CM

## 2020-12-16 DIAGNOSIS — R945 Abnormal results of liver function studies: Secondary | ICD-10-CM

## 2020-12-16 DIAGNOSIS — E7220 Disorder of urea cycle metabolism, unspecified: Secondary | ICD-10-CM | POA: Diagnosis not present

## 2020-12-16 DIAGNOSIS — R339 Retention of urine, unspecified: Secondary | ICD-10-CM | POA: Diagnosis not present

## 2020-12-16 DIAGNOSIS — D509 Iron deficiency anemia, unspecified: Secondary | ICD-10-CM | POA: Diagnosis present

## 2020-12-16 DIAGNOSIS — Z9114 Patient's other noncompliance with medication regimen: Secondary | ICD-10-CM

## 2020-12-16 DIAGNOSIS — J9602 Acute respiratory failure with hypercapnia: Secondary | ICD-10-CM

## 2020-12-16 DIAGNOSIS — J690 Pneumonitis due to inhalation of food and vomit: Secondary | ICD-10-CM | POA: Diagnosis not present

## 2020-12-16 DIAGNOSIS — F17210 Nicotine dependence, cigarettes, uncomplicated: Secondary | ICD-10-CM | POA: Diagnosis present

## 2020-12-16 DIAGNOSIS — E870 Hyperosmolality and hypernatremia: Secondary | ICD-10-CM | POA: Diagnosis not present

## 2020-12-16 DIAGNOSIS — E875 Hyperkalemia: Secondary | ICD-10-CM | POA: Diagnosis not present

## 2020-12-16 DIAGNOSIS — E871 Hypo-osmolality and hyponatremia: Secondary | ICD-10-CM | POA: Diagnosis not present

## 2020-12-16 DIAGNOSIS — Z4659 Encounter for fitting and adjustment of other gastrointestinal appliance and device: Secondary | ICD-10-CM

## 2020-12-16 DIAGNOSIS — R464 Slowness and poor responsiveness: Secondary | ICD-10-CM | POA: Diagnosis present

## 2020-12-16 DIAGNOSIS — Z79899 Other long term (current) drug therapy: Secondary | ICD-10-CM

## 2020-12-16 DIAGNOSIS — A419 Sepsis, unspecified organism: Secondary | ICD-10-CM | POA: Diagnosis not present

## 2020-12-16 DIAGNOSIS — I5021 Acute systolic (congestive) heart failure: Secondary | ICD-10-CM

## 2020-12-16 DIAGNOSIS — D6489 Other specified anemias: Secondary | ICD-10-CM | POA: Diagnosis not present

## 2020-12-16 DIAGNOSIS — I440 Atrioventricular block, first degree: Secondary | ICD-10-CM | POA: Diagnosis not present

## 2020-12-16 DIAGNOSIS — J9621 Acute and chronic respiratory failure with hypoxia: Secondary | ICD-10-CM | POA: Diagnosis not present

## 2020-12-16 DIAGNOSIS — R9431 Abnormal electrocardiogram [ECG] [EKG]: Secondary | ICD-10-CM | POA: Diagnosis not present

## 2020-12-16 DIAGNOSIS — D1803 Hemangioma of intra-abdominal structures: Secondary | ICD-10-CM | POA: Diagnosis not present

## 2020-12-16 DIAGNOSIS — A4150 Gram-negative sepsis, unspecified: Secondary | ICD-10-CM | POA: Diagnosis not present

## 2020-12-16 LAB — URINALYSIS, COMPLETE (UACMP) WITH MICROSCOPIC
Bilirubin Urine: NEGATIVE
Glucose, UA: NEGATIVE mg/dL
Ketones, ur: NEGATIVE mg/dL
Leukocytes,Ua: NEGATIVE
Nitrite: NEGATIVE
Protein, ur: 30 mg/dL — AB
Specific Gravity, Urine: 1.014 (ref 1.005–1.030)
Squamous Epithelial / HPF: NONE SEEN (ref 0–5)
pH: 5 (ref 5.0–8.0)

## 2020-12-16 LAB — BLOOD GAS, ARTERIAL
Acid-base deficit: 5.7 mmol/L — ABNORMAL HIGH (ref 0.0–2.0)
Bicarbonate: 21.9 mmol/L (ref 20.0–28.0)
FIO2: 0.5
MECHVT: 450 mL
O2 Saturation: 94.9 %
PEEP: 5 cmH2O
Patient temperature: 37
RATE: 18 resp/min
pCO2 arterial: 50 mmHg — ABNORMAL HIGH (ref 32.0–48.0)
pH, Arterial: 7.25 — ABNORMAL LOW (ref 7.350–7.450)
pO2, Arterial: 87 mmHg (ref 83.0–108.0)

## 2020-12-16 LAB — URINE DRUG SCREEN, QUALITATIVE (ARMC ONLY)
Amphetamines, Ur Screen: NOT DETECTED
Barbiturates, Ur Screen: NOT DETECTED
Benzodiazepine, Ur Scrn: POSITIVE — AB
Cannabinoid 50 Ng, Ur ~~LOC~~: POSITIVE — AB
Cocaine Metabolite,Ur ~~LOC~~: POSITIVE — AB
MDMA (Ecstasy)Ur Screen: NOT DETECTED
Methadone Scn, Ur: NOT DETECTED
Opiate, Ur Screen: NOT DETECTED
Phencyclidine (PCP) Ur S: NOT DETECTED
Tricyclic, Ur Screen: NOT DETECTED

## 2020-12-16 LAB — CBC WITH DIFFERENTIAL/PLATELET
Abs Immature Granulocytes: 0.33 10*3/uL — ABNORMAL HIGH (ref 0.00–0.07)
Basophils Absolute: 0.1 10*3/uL (ref 0.0–0.1)
Basophils Relative: 1 %
Eosinophils Absolute: 0 10*3/uL (ref 0.0–0.5)
Eosinophils Relative: 0 %
HCT: 41 % (ref 39.0–52.0)
Hemoglobin: 13.7 g/dL (ref 13.0–17.0)
Immature Granulocytes: 3 %
Lymphocytes Relative: 9 %
Lymphs Abs: 1.1 10*3/uL (ref 0.7–4.0)
MCH: 30.2 pg (ref 26.0–34.0)
MCHC: 33.4 g/dL (ref 30.0–36.0)
MCV: 90.5 fL (ref 80.0–100.0)
Monocytes Absolute: 0.9 10*3/uL (ref 0.1–1.0)
Monocytes Relative: 8 %
Neutro Abs: 9.6 10*3/uL — ABNORMAL HIGH (ref 1.7–7.7)
Neutrophils Relative %: 79 %
Platelets: 214 10*3/uL (ref 150–400)
RBC: 4.53 MIL/uL (ref 4.22–5.81)
RDW: 15.1 % (ref 11.5–15.5)
WBC: 12.1 10*3/uL — ABNORMAL HIGH (ref 4.0–10.5)
nRBC: 0 % (ref 0.0–0.2)

## 2020-12-16 LAB — RESP PANEL BY RT-PCR (FLU A&B, COVID) ARPGX2
Influenza A by PCR: NEGATIVE
Influenza B by PCR: NEGATIVE
SARS Coronavirus 2 by RT PCR: NEGATIVE

## 2020-12-16 LAB — POCT I-STAT 7, (LYTES, BLD GAS, ICA,H+H)
Acid-base deficit: 1 mmol/L (ref 0.0–2.0)
Bicarbonate: 22.6 mmol/L (ref 20.0–28.0)
Calcium, Ion: 1.18 mmol/L (ref 1.15–1.40)
HCT: 34 % — ABNORMAL LOW (ref 39.0–52.0)
Hemoglobin: 11.6 g/dL — ABNORMAL LOW (ref 13.0–17.0)
O2 Saturation: 96 %
Patient temperature: 98
Potassium: 2.9 mmol/L — ABNORMAL LOW (ref 3.5–5.1)
Sodium: 140 mmol/L (ref 135–145)
TCO2: 24 mmol/L (ref 22–32)
pCO2 arterial: 33.4 mmHg (ref 32.0–48.0)
pH, Arterial: 7.436 (ref 7.350–7.450)
pO2, Arterial: 80 mmHg — ABNORMAL LOW (ref 83.0–108.0)

## 2020-12-16 LAB — COMPREHENSIVE METABOLIC PANEL
ALT: 18 U/L (ref 0–44)
AST: 30 U/L (ref 15–41)
Albumin: 3.6 g/dL (ref 3.5–5.0)
Alkaline Phosphatase: 51 U/L (ref 38–126)
Anion gap: 10 (ref 5–15)
BUN: 11 mg/dL (ref 6–20)
CO2: 24 mmol/L (ref 22–32)
Calcium: 8.2 mg/dL — ABNORMAL LOW (ref 8.9–10.3)
Chloride: 106 mmol/L (ref 98–111)
Creatinine, Ser: 0.91 mg/dL (ref 0.61–1.24)
GFR, Estimated: >60 mL/min (ref >60)
Glucose, Bld: 102 mg/dL — ABNORMAL HIGH (ref 70–99)
Potassium: 3.4 mmol/L — ABNORMAL LOW (ref 3.5–5.1)
Sodium: 140 mmol/L (ref 135–145)
Total Bilirubin: 0.9 mg/dL (ref 0.3–1.2)
Total Protein: 6.5 g/dL (ref 6.5–8.1)

## 2020-12-16 LAB — GLUCOSE, CAPILLARY
Glucose-Capillary: 87 mg/dL (ref 70–99)
Glucose-Capillary: 95 mg/dL (ref 70–99)

## 2020-12-16 LAB — CBG MONITORING, ED: Glucose-Capillary: 132 mg/dL — ABNORMAL HIGH (ref 70–99)

## 2020-12-16 LAB — MAGNESIUM: Magnesium: 2.1 mg/dL (ref 1.7–2.4)

## 2020-12-16 LAB — ETHANOL: Alcohol, Ethyl (B): <10 mg/dL (ref <10)

## 2020-12-16 LAB — MRSA NEXT GEN BY PCR, NASAL: MRSA by PCR Next Gen: NOT DETECTED

## 2020-12-16 LAB — LACTIC ACID, PLASMA
Lactic Acid, Venous: 6 mmol/L (ref 0.5–1.9)
Lactic Acid, Venous: 2.7 mmol/L (ref 0.5–1.9)

## 2020-12-16 LAB — PHENYTOIN LEVEL, TOTAL: Phenytoin Lvl: <2.5 ug/mL — ABNORMAL LOW (ref 10.0–20.0)

## 2020-12-16 IMAGING — DX DG ABD PORTABLE 1V
1 series · 1 of 1 positions shown · non-contrast
Comparison: None.

CLINICAL DATA: OG placement

EXAM:
PORTABLE ABDOMEN - 1 VIEW

[abdomen supine]
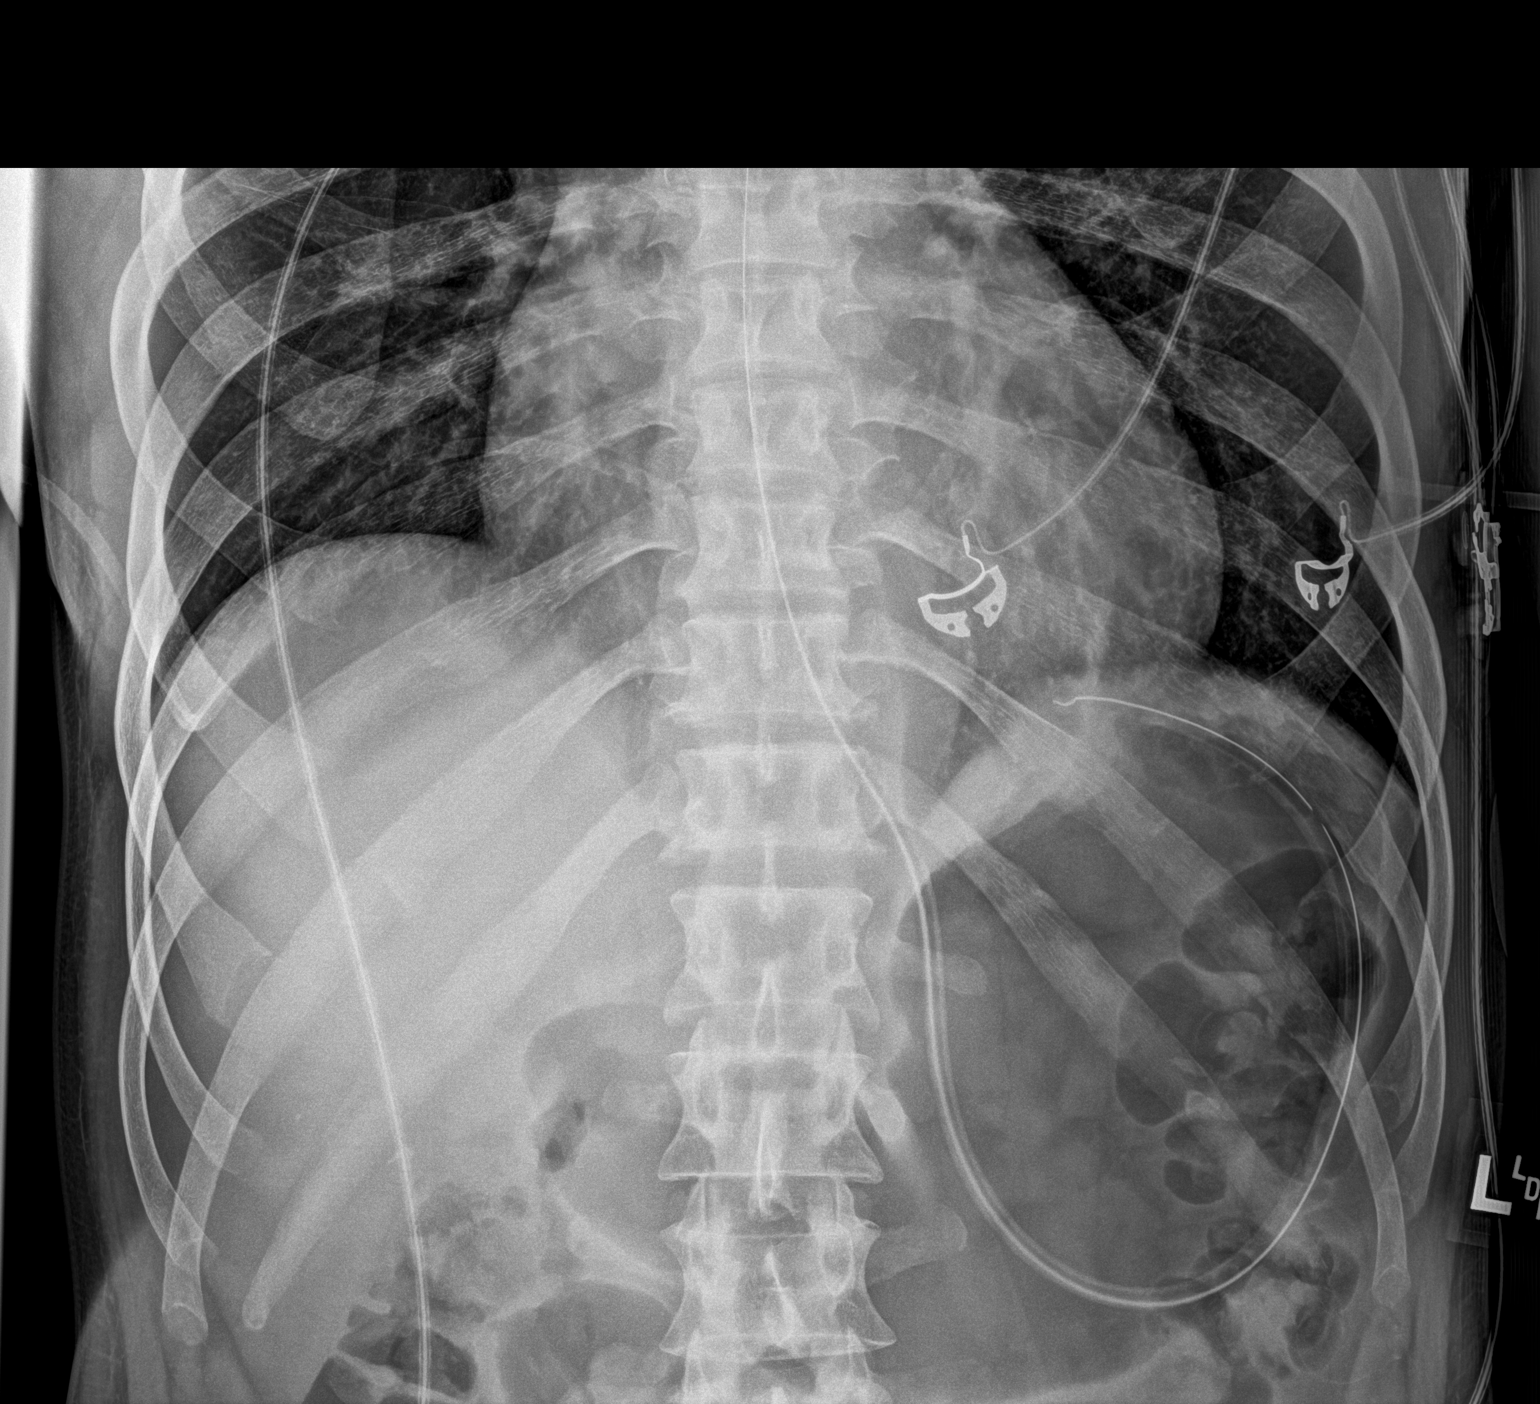

[1 of 1 positions shown; findings below may reference images not displayed]

FINDINGS: Enteric tube appears appropriately positioned in the stomach. Lung
bases appear clear. No free intraperitoneal air seen in the upper
abdomen.
IMPRESSION: Enteric tube appears appropriately positioned in the stomach.

## 2020-12-16 IMAGING — CT CT HEAD W/O CM
3 of 4 series · 15 of 47 positions shown, 18 images · non-contrast
Comparison: Head CT [DATE]

CLINICAL DATA: Pt began having seizure like activity- called charge
and asked her to notify MDSeizure, abnormal neuro exam

EXAM:
CT HEAD WITHOUT CONTRAST
TECHNIQUE: Contiguous axial images were obtained from the base of the skull
through the vertex without intravenous contrast.

[Series 2: head wo · axial · 0.48mm/px · z∈[-217,-67]mm · 9 of 36 slices shown, 12 images]
[im 3/36  brain]
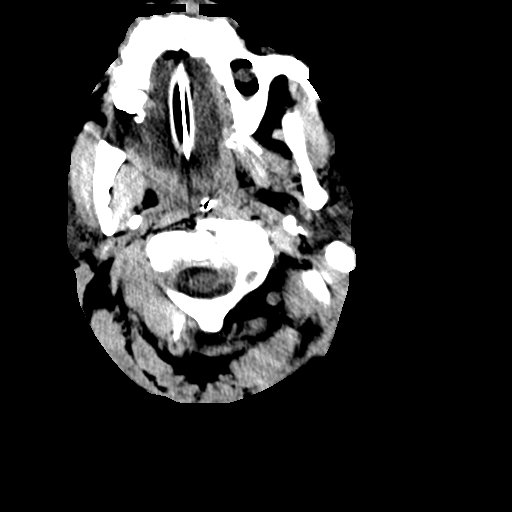
[im 3/36  bone]
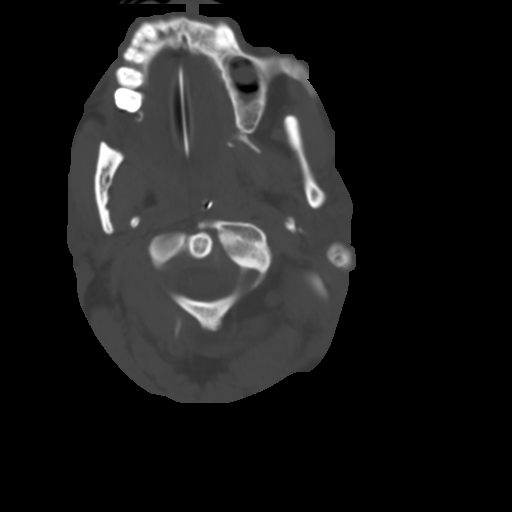
[im 8/36  brain]
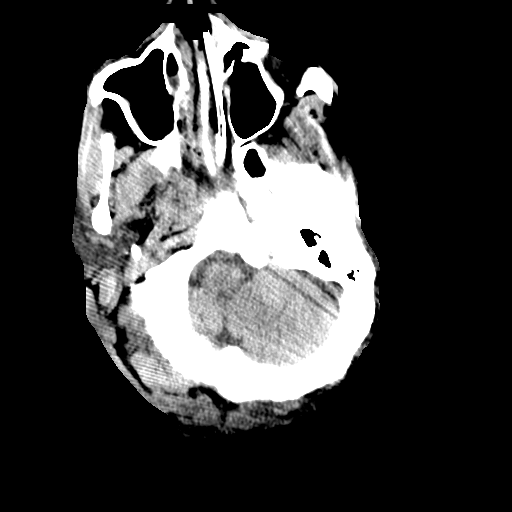
[im 11/36  brain]
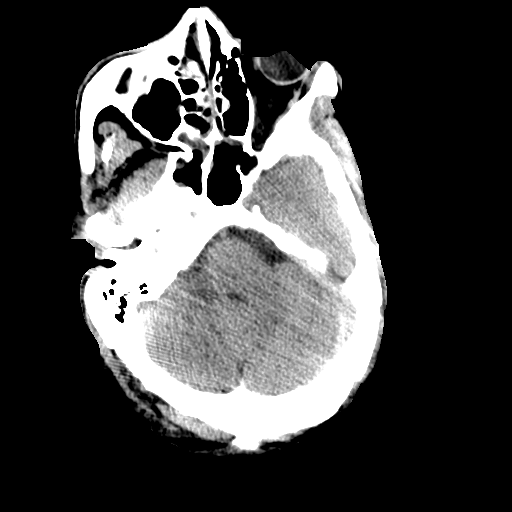
[im 16/36  brain]
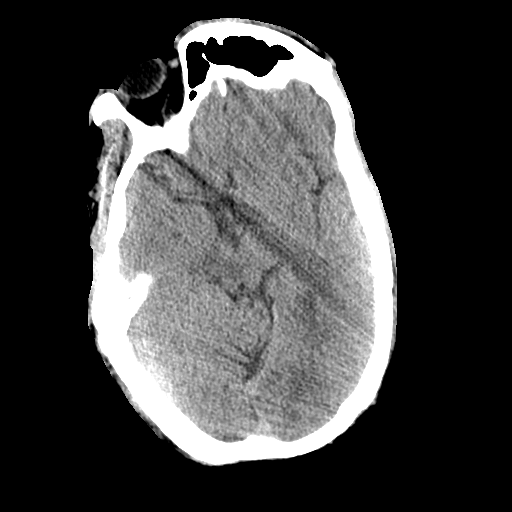
[im 18/36  brain]
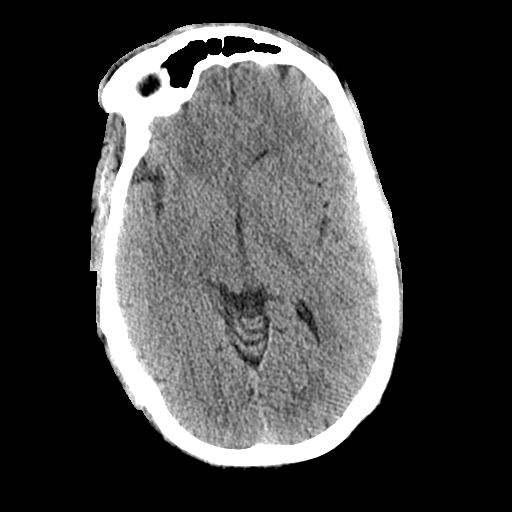
[im 18/36  bone]
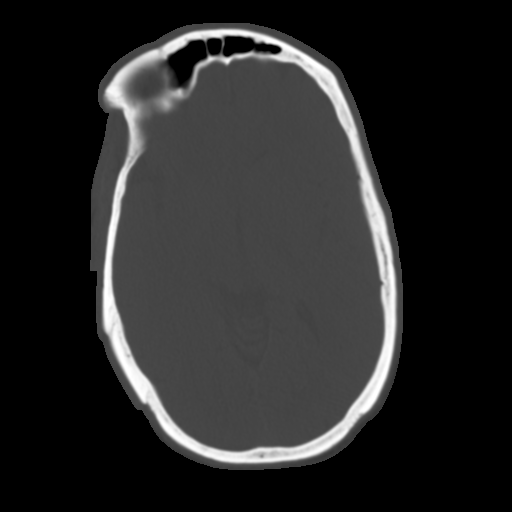
[im 21/36  brain]
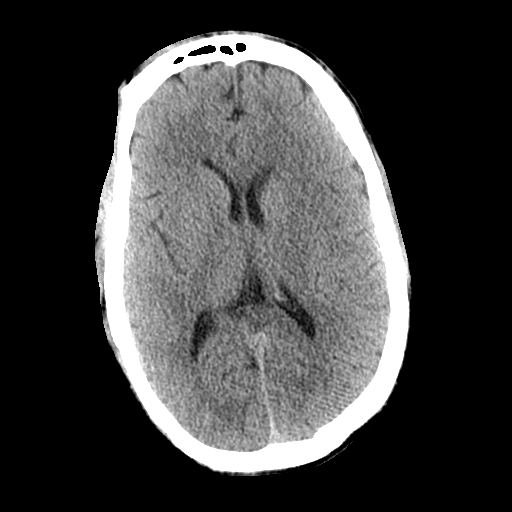
[im 26/36  brain]
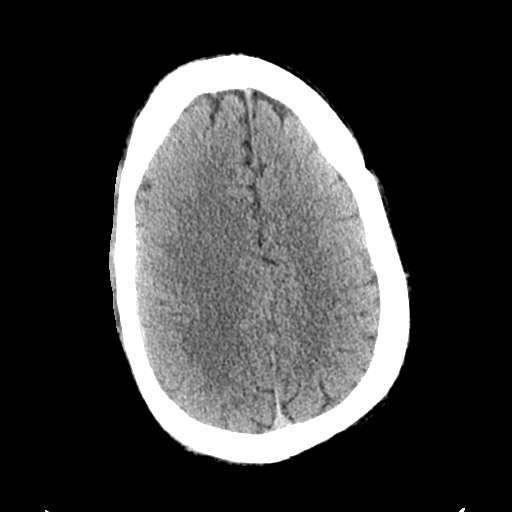
[im 28/36  brain]
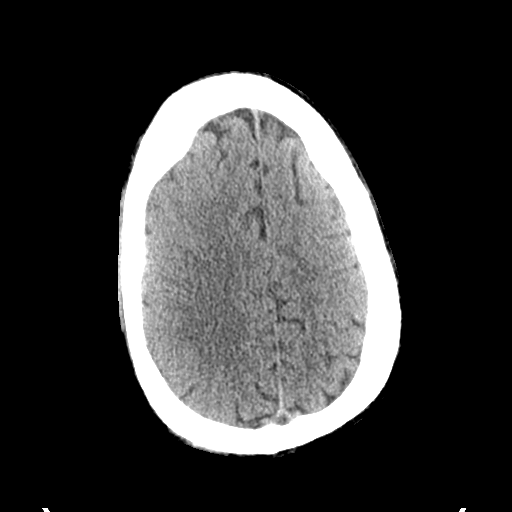
[im 33/36  brain]
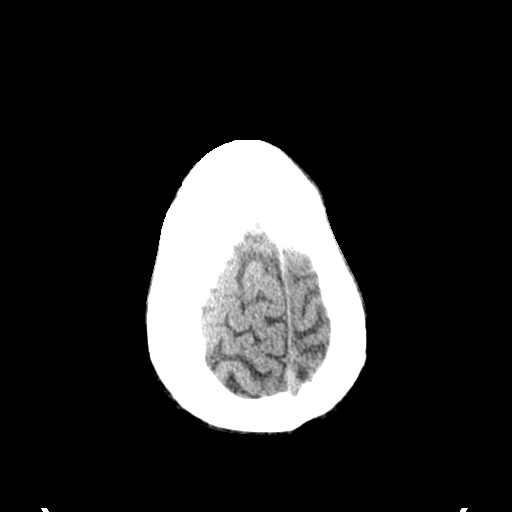
[im 33/36  bone]
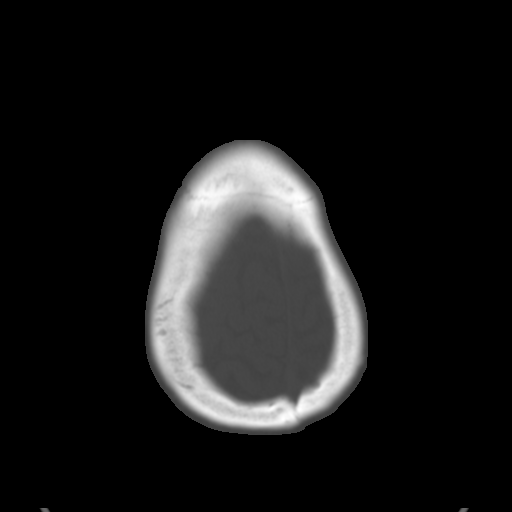

[Series 4: coronal soft tissue · coronal · 0.44mm/px · 3 of 84 slices shown]
[im 28/84  brain]
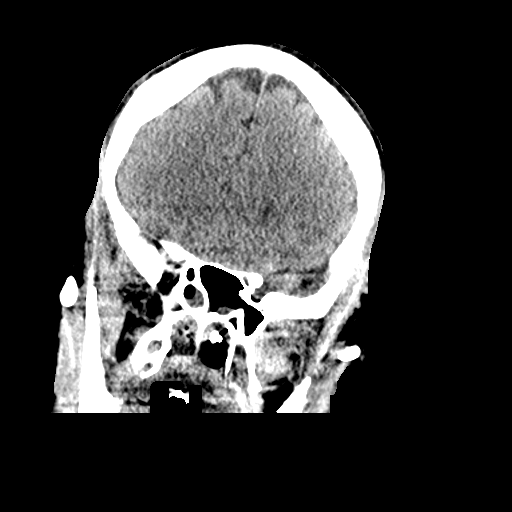
[im 37/84  brain]
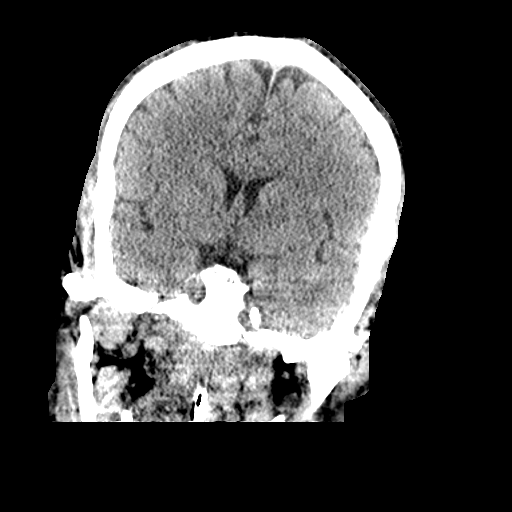
[im 47/84  brain]
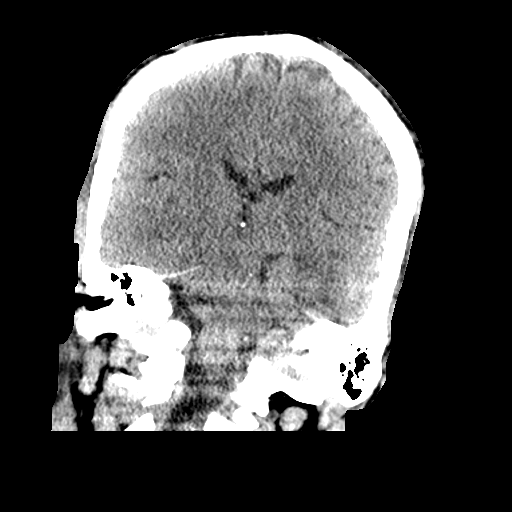

[Series 5: sagittal soft tissue · sagittal · 0.41mm/px · 3 of 84 slices shown]
[im 28/84  brain]
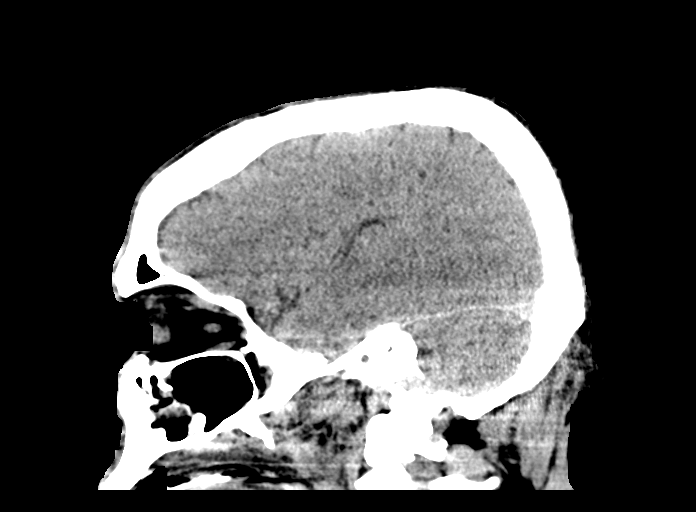
[im 42/84  brain]
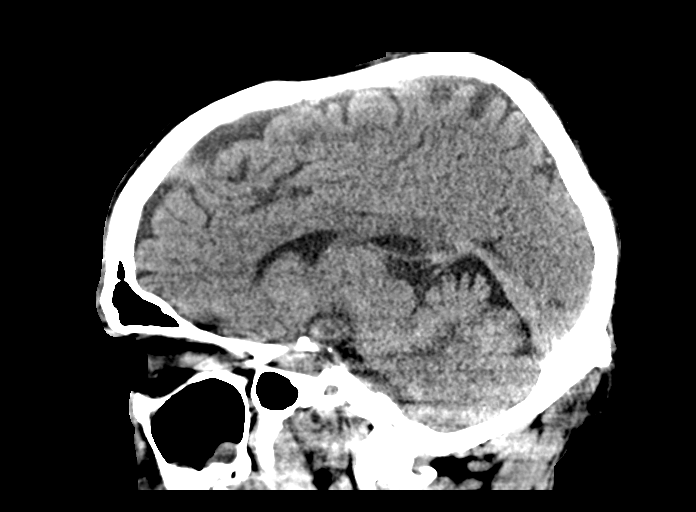
[im 56/84  brain]
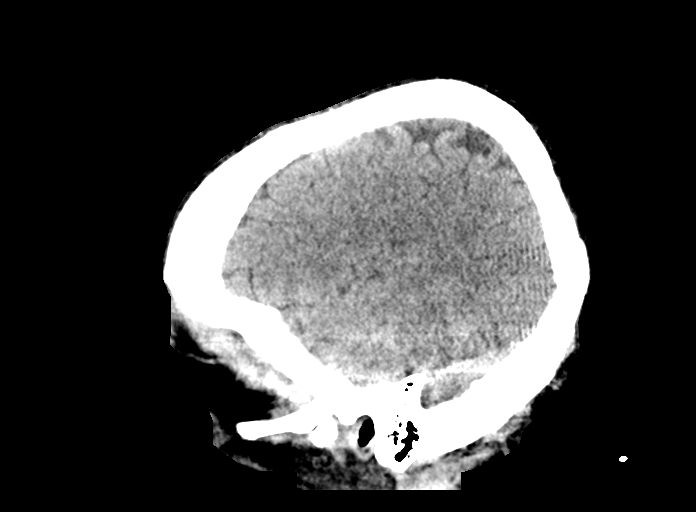

[15 of 47 positions shown; findings below may reference images not displayed]

FINDINGS: Brain: No acute intracranial hemorrhage. No focal mass lesion. No CT
evidence of acute infarction. No midline shift or mass effect. No
hydrocephalus. Basilar cisterns are patent.

Vascular: No hyperdense vessel or unexpected calcification.

Skull: Normal. Negative for fracture or focal lesion.

Sinuses/Orbits: Mucosal thickening in the maxillary sinus. NG tube
noted.

Other: Intubated patient with some skull artifact due to nonstandard
positioning.
IMPRESSION: No acute intracranial findings.

Intubated patient with skull base artifact.

## 2020-12-16 IMAGING — DX DG CHEST 1V PORT
1 series · 2 of 2 positions shown · non-contrast
Comparison: Chest x-ray dated [DATE]

CLINICAL DATA: Endotracheal tube placement

EXAM:
PORTABLE CHEST 1 VIEW

[Series 1: chest ap · 0.14mm/px · 2 of 2 slices shown]
[im 1/2]
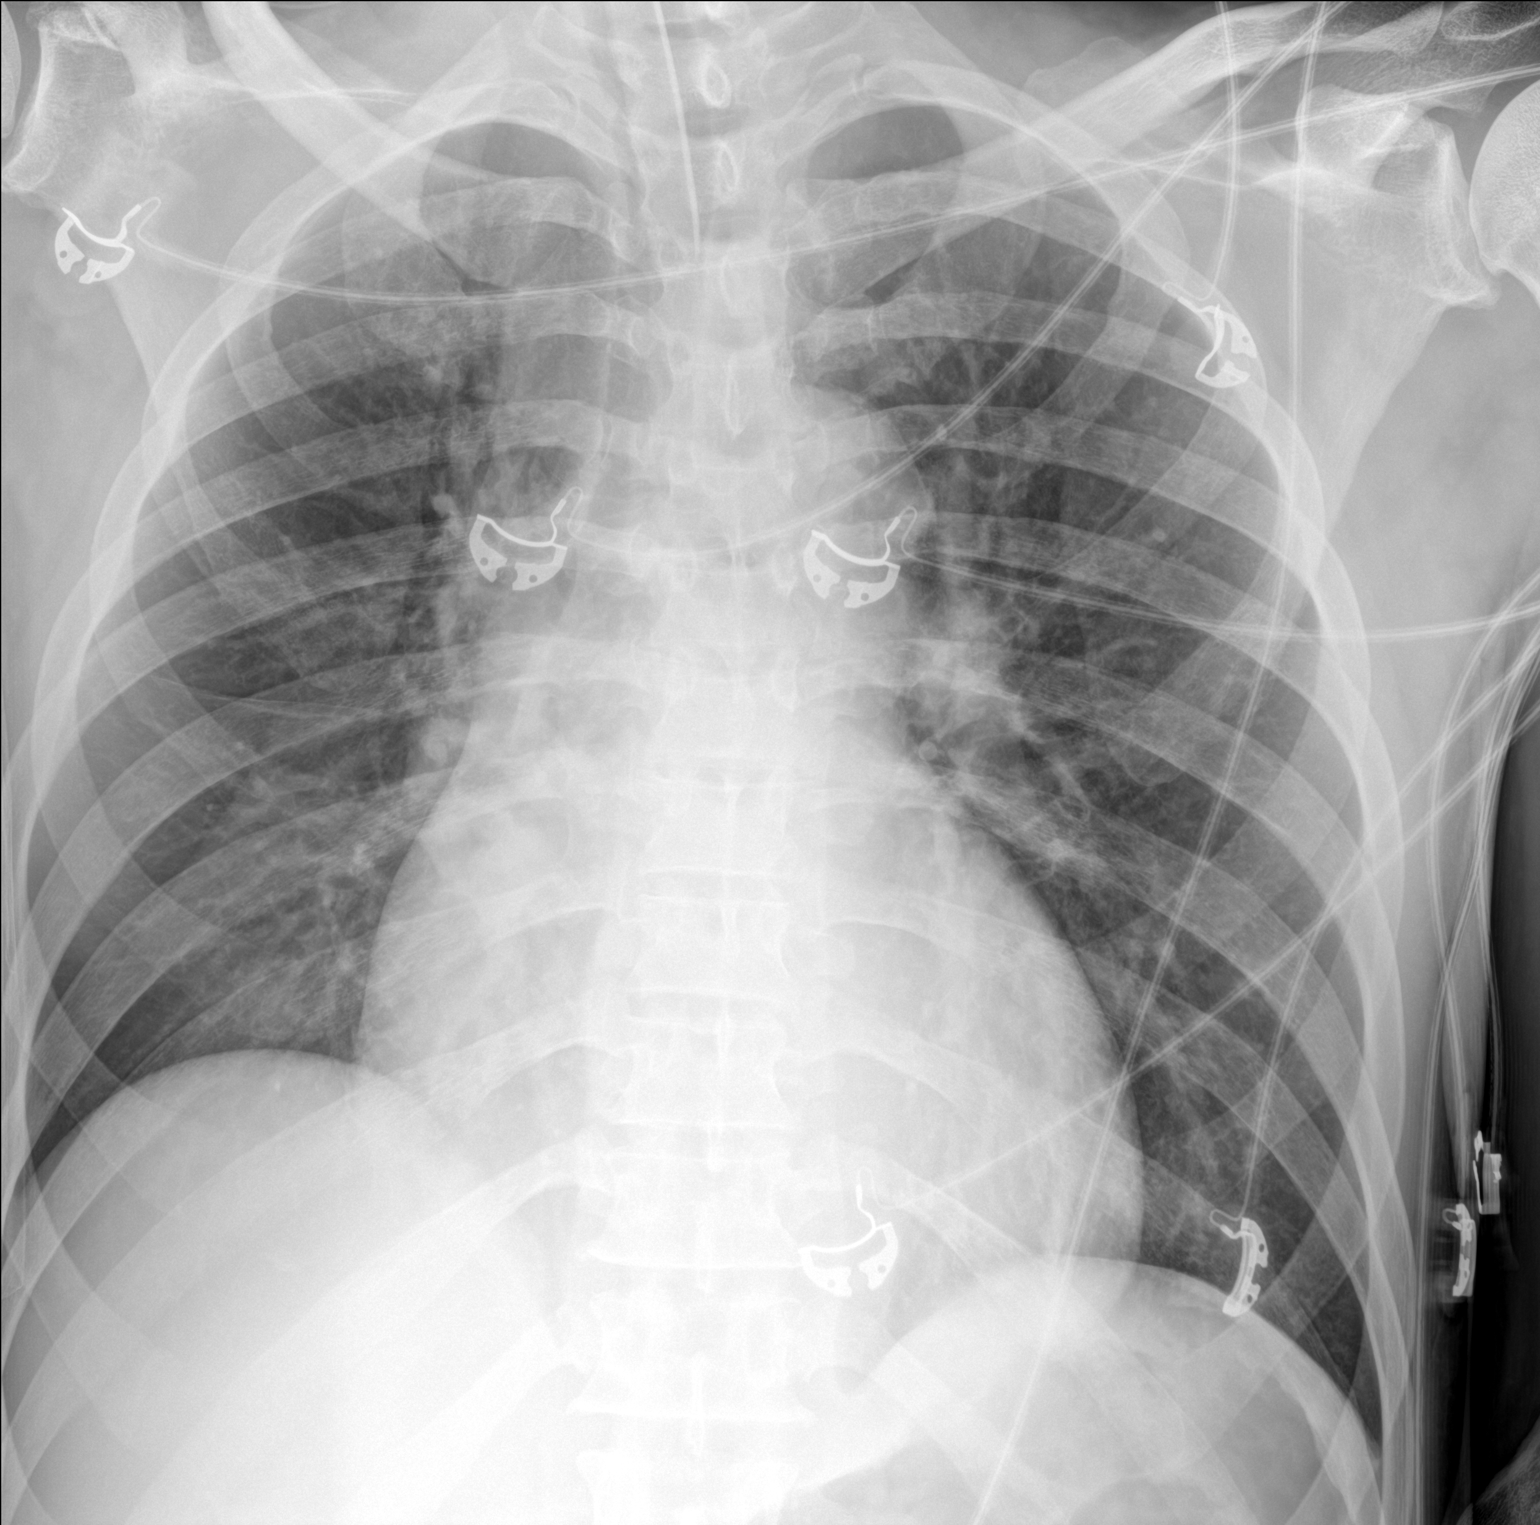
[im 2/2]
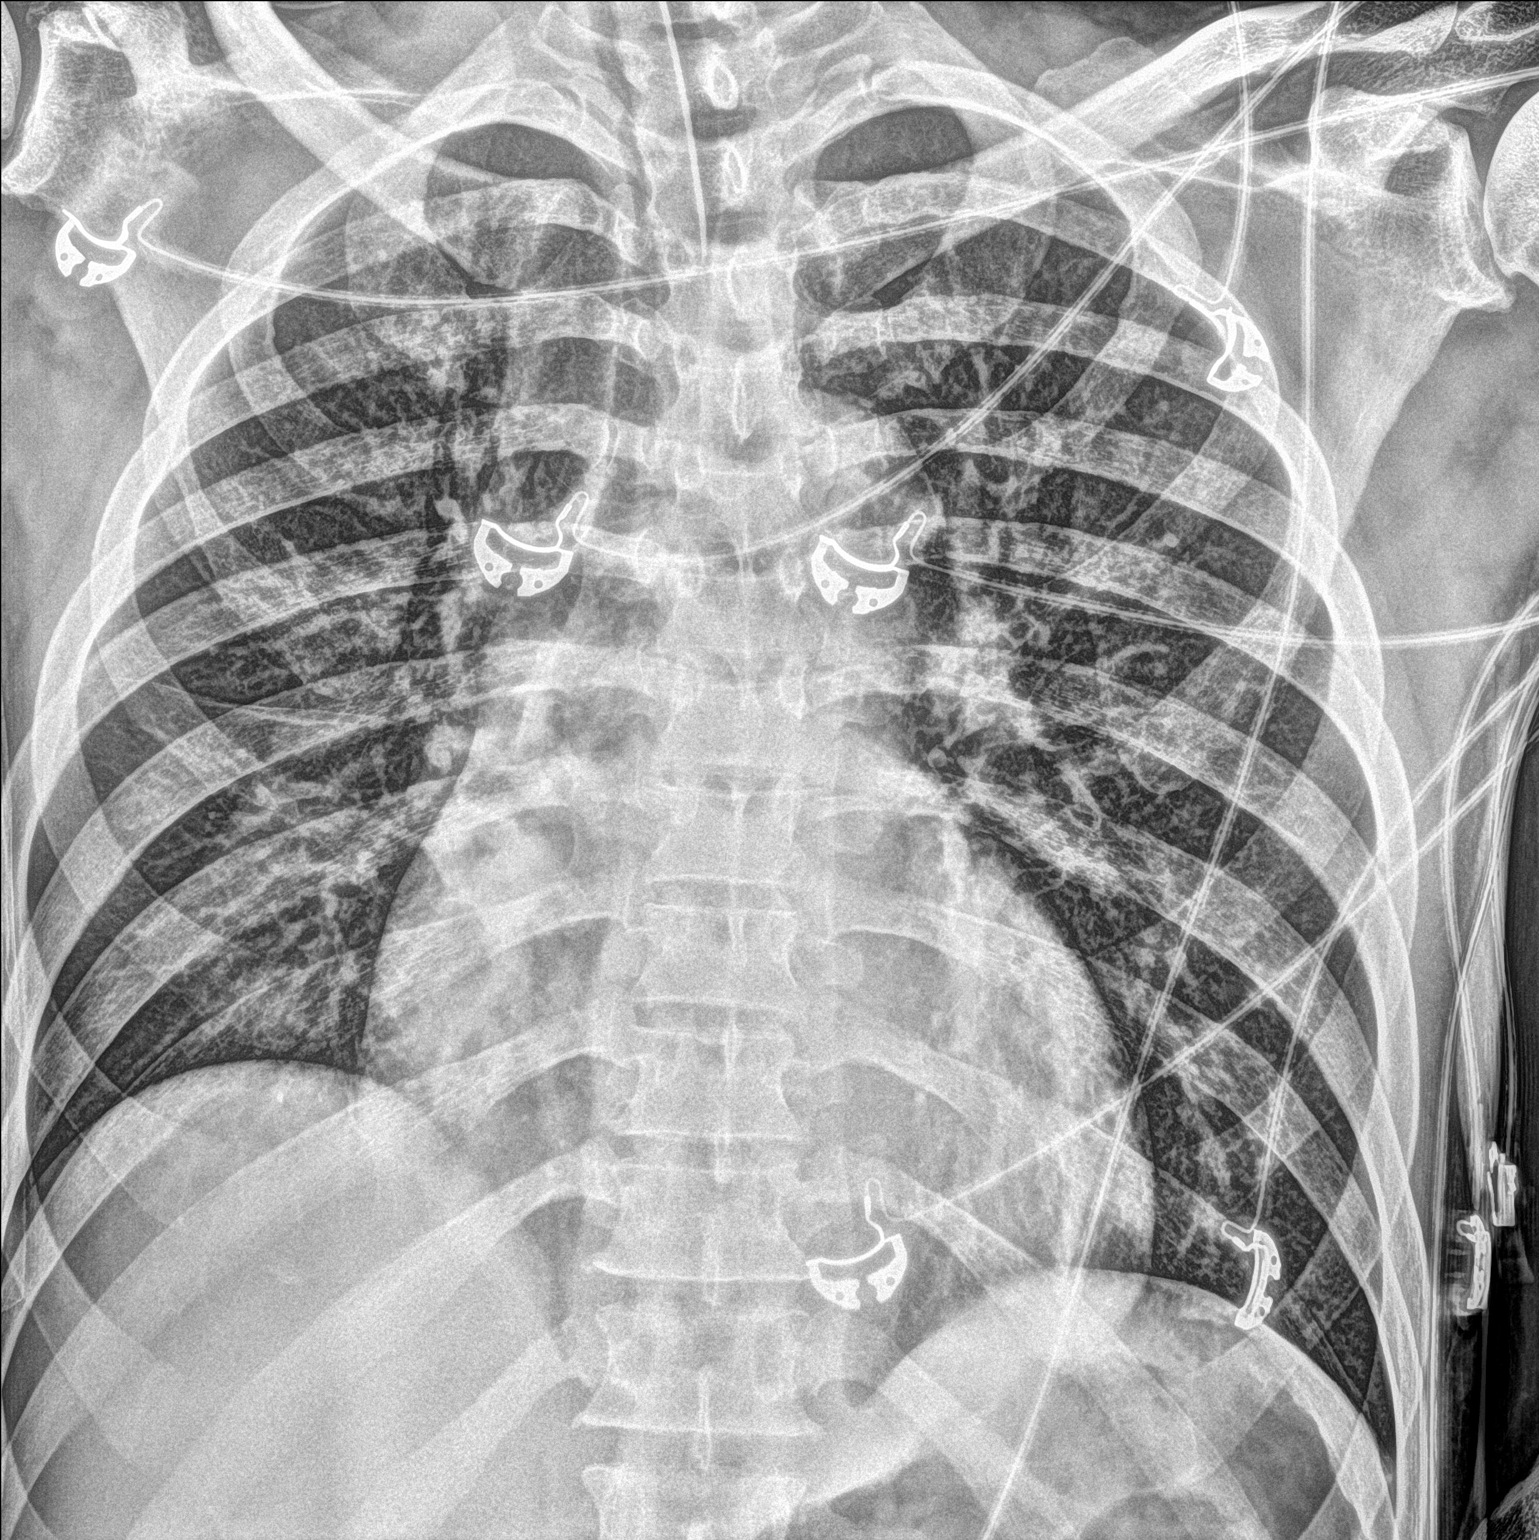

[2 of 2 positions shown; findings below may reference images not displayed]

FINDINGS: Endotracheal tube is positioned with tip at the level of the
clavicles, approximately 5 cm above the carina. Heart size is
stable. Lungs are clear. No pleural effusion or pneumothorax is
seen. Osseous structures about the chest are unremarkable.
IMPRESSION: 1. Endotracheal tube tip at the level of the clavicles,
approximately 5 cm above the carina.
2. Lungs are clear.

## 2020-12-16 IMAGING — DX DG CHEST 1V PORT
1 series · 1 of 1 positions shown · non-contrast
Comparison: Chest x-ray from earlier same day.

CLINICAL DATA: Endotracheal tube and OG placement.

EXAM:
PORTABLE CHEST 1 VIEW

[chest ap]
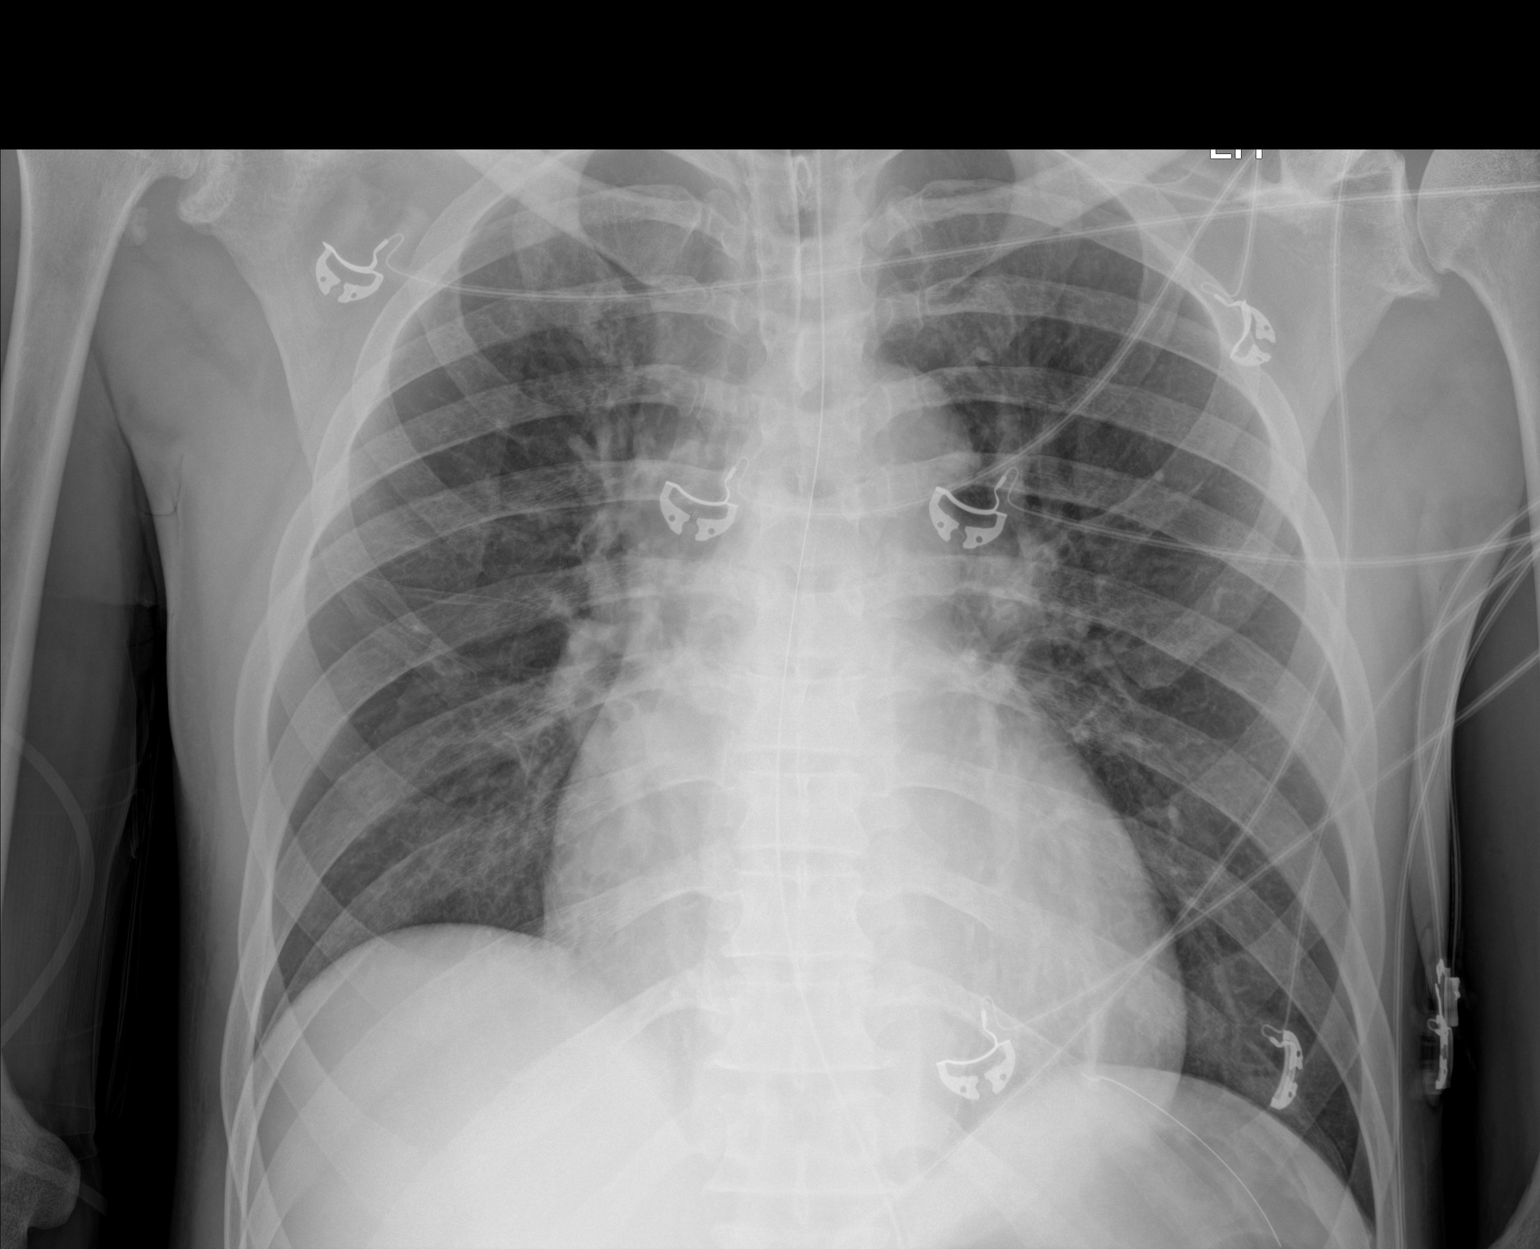

[1 of 1 positions shown; findings below may reference images not displayed]

FINDINGS: Endotracheal tube now appears well positioned with tip approximately
3 cm above the level of the carina. Enteric tube passes below the
diaphragm. Lungs are clear. No pleural effusion or pneumothorax is
seen. Heart size is stable.
IMPRESSION: 1. Endotracheal tube now appears well positioned with tip
approximately 3 cm above the level of the carina.
2. Enteric tube passes below the diaphragm.
3. Lungs are clear.

## 2020-12-16 MED ORDER — MIDAZOLAM HCL 2 MG/2ML IJ SOLN
10.0000 mg | Freq: Once | INTRAMUSCULAR | Status: AC
  Administered 2020-12-16: 10 mg via INTRAVENOUS
  Filled 2020-12-16: qty 10

## 2020-12-16 MED ORDER — CHLORHEXIDINE GLUCONATE 0.12% ORAL RINSE (MEDLINE KIT)
15.0000 mL | Freq: Two times a day (BID) | OROMUCOSAL | Status: DC
  Administered 2020-12-16 – 2021-01-16 (×63): 15 mL via OROMUCOSAL

## 2020-12-16 MED ORDER — PHENYTOIN SODIUM 50 MG/ML IJ SOLN
100.0000 mg | Freq: Three times a day (TID) | INTRAMUSCULAR | Status: DC
  Administered 2020-12-17 – 2020-12-24 (×22): 100 mg via INTRAVENOUS
  Filled 2020-12-16 (×24): qty 2

## 2020-12-16 MED ORDER — LEVETIRACETAM IN NACL 500 MG/100ML IV SOLN
500.0000 mg | Freq: Two times a day (BID) | INTRAVENOUS | Status: DC
  Administered 2020-12-16 – 2020-12-17 (×3): 500 mg via INTRAVENOUS
  Filled 2020-12-16 (×3): qty 100

## 2020-12-16 MED ORDER — CHLORHEXIDINE GLUCONATE CLOTH 2 % EX PADS
6.0000 | MEDICATED_PAD | Freq: Every day | CUTANEOUS | Status: DC
  Administered 2020-12-16 – 2021-01-16 (×31): 6 via TOPICAL

## 2020-12-16 MED ORDER — DOCUSATE SODIUM 100 MG PO CAPS: 100.0000 mg | ORAL_CAPSULE | Freq: Two times a day (BID) | ORAL | Status: DC | PRN

## 2020-12-16 MED ORDER — LORAZEPAM 2 MG/ML IJ SOLN
4.0000 mg | Freq: Once | INTRAMUSCULAR | Status: AC
  Administered 2020-12-16: 4 mg via INTRAVENOUS

## 2020-12-16 MED ORDER — PROPOFOL 1000 MG/100ML IV EMUL
INTRAVENOUS | Status: AC
  Administered 2020-12-16: 100 mg via INTRAVENOUS
  Filled 2020-12-16: qty 100

## 2020-12-16 MED ORDER — SODIUM CHLORIDE 0.9 % IV SOLN
1500.0000 mg | Freq: Once | INTRAVENOUS | Status: AC
  Administered 2020-12-16: 1500 mg via INTRAVENOUS
  Filled 2020-12-16: qty 30

## 2020-12-16 MED ORDER — FENTANYL CITRATE (PF) 100 MCG/2ML IJ SOLN
50.0000 ug | INTRAMUSCULAR | Status: DC | PRN
  Administered 2020-12-23: 50 ug via INTRAVENOUS
  Filled 2020-12-16: qty 2

## 2020-12-16 MED ORDER — ACETAMINOPHEN 160 MG/5ML PO SOLN
650.0000 mg | ORAL | Status: DC | PRN
  Administered 2020-12-18 – 2021-01-16 (×25): 650 mg
  Filled 2020-12-16 (×25): qty 20.3

## 2020-12-16 MED ORDER — MIDAZOLAM 50MG/50ML (1MG/ML) PREMIX INFUSION
10.0000 mg/h | INTRAVENOUS | Status: DC
  Administered 2020-12-16 (×2): 10 mg/h via INTRAVENOUS
  Filled 2020-12-16 (×2): qty 50

## 2020-12-16 MED ORDER — PROPOFOL 10 MG/ML IV BOLUS: 100.0000 mg | Freq: Once | INTRAVENOUS | Status: DC

## 2020-12-16 MED ORDER — LORAZEPAM 2 MG/ML IJ SOLN
INTRAMUSCULAR | Status: AC
  Filled 2020-12-16: qty 2

## 2020-12-16 MED ORDER — ORAL CARE MOUTH RINSE
15.0000 mL | OROMUCOSAL | Status: DC
  Administered 2020-12-16 – 2021-01-16 (×308): 15 mL via OROMUCOSAL

## 2020-12-16 MED ORDER — FENTANYL CITRATE (PF) 100 MCG/2ML IJ SOLN
50.0000 ug | INTRAMUSCULAR | Status: DC | PRN
  Administered 2020-12-19: 100 ug via INTRAVENOUS
  Filled 2020-12-16: qty 2

## 2020-12-16 MED ORDER — PROPOFOL 1000 MG/100ML IV EMUL
5.0000 ug/kg/min | INTRAVENOUS | Status: DC
  Administered 2020-12-16: 10 ug/kg/min via INTRAVENOUS

## 2020-12-16 MED ORDER — LORAZEPAM 2 MG/ML IJ SOLN: 4.0000 mg | Freq: Once | INTRAMUSCULAR | Status: AC

## 2020-12-16 MED ORDER — PROPOFOL 1000 MG/100ML IV EMUL: 5.0000 ug/kg/min | INTRAVENOUS | Status: DC

## 2020-12-16 MED ORDER — MIDAZOLAM-SODIUM CHLORIDE 100-0.9 MG/100ML-% IV SOLN
0.5000 mg/h | INTRAVENOUS | Status: DC
  Administered 2020-12-16 – 2020-12-17 (×2): 10 mg/h via INTRAVENOUS
  Filled 2020-12-16 (×2): qty 100

## 2020-12-16 MED ORDER — SODIUM CHLORIDE 0.9 % IV SOLN
250.0000 mL | INTRAVENOUS | Status: DC
  Administered 2020-12-18: 250 mL via INTRAVENOUS
  Administered 2020-12-20: 500 mL via INTRAVENOUS
  Administered 2020-12-20 – 2020-12-29 (×6): 250 mL via INTRAVENOUS

## 2020-12-16 MED ORDER — POTASSIUM CHLORIDE 20 MEQ PO PACK
40.0000 meq | PACK | Freq: Once | ORAL | Status: AC
  Administered 2020-12-16: 40 meq
  Filled 2020-12-16: qty 2

## 2020-12-16 MED ORDER — PANTOPRAZOLE SODIUM 40 MG PO PACK
40.0000 mg | PACK | Freq: Every day | ORAL | Status: DC
  Administered 2020-12-16 – 2021-01-16 (×32): 40 mg
  Filled 2020-12-16 (×32): qty 20

## 2020-12-16 MED ORDER — DOCUSATE SODIUM 50 MG/5ML PO LIQD: 100.0000 mg | Freq: Two times a day (BID) | ORAL | Status: DC | PRN

## 2020-12-16 MED ORDER — SODIUM CHLORIDE 0.9 % IV SOLN: 4000.0000 mg | Freq: Once | INTRAVENOUS | Status: DC

## 2020-12-16 MED ORDER — PROPOFOL 1000 MG/100ML IV EMUL
5.0000 ug/kg/min | INTRAVENOUS | Status: DC
  Administered 2020-12-16 (×2): 80 ug/kg/min via INTRAVENOUS
  Administered 2020-12-16: 30 ug/kg/min via INTRAVENOUS
  Administered 2020-12-16: 80 ug/kg/min via INTRAVENOUS
  Filled 2020-12-16 (×3): qty 100

## 2020-12-16 MED ORDER — PROPOFOL 1000 MG/100ML IV EMUL
5.0000 ug/kg/min | INTRAVENOUS | Status: DC
  Administered 2020-12-16 – 2020-12-17 (×5): 80 ug/kg/min via INTRAVENOUS
  Administered 2020-12-17 (×3): 60 ug/kg/min via INTRAVENOUS
  Administered 2020-12-17 (×3): 80 ug/kg/min via INTRAVENOUS
  Administered 2020-12-17: 60 ug/kg/min via INTRAVENOUS
  Administered 2020-12-18 (×4): 80 ug/kg/min via INTRAVENOUS
  Administered 2020-12-18: 60 ug/kg/min via INTRAVENOUS
  Administered 2020-12-18: 80 ug/kg/min via INTRAVENOUS
  Administered 2020-12-18: 60 ug/kg/min via INTRAVENOUS
  Administered 2020-12-18 – 2020-12-20 (×15): 80 ug/kg/min via INTRAVENOUS
  Filled 2020-12-16 (×5): qty 100
  Filled 2020-12-16: qty 200
  Filled 2020-12-16 (×4): qty 100
  Filled 2020-12-16: qty 200
  Filled 2020-12-16 (×6): qty 100
  Filled 2020-12-16 (×2): qty 200
  Filled 2020-12-16 (×4): qty 100
  Filled 2020-12-16 (×2): qty 200
  Filled 2020-12-16: qty 100

## 2020-12-16 MED ORDER — SODIUM CHLORIDE 0.9 % IV BOLUS
1000.0000 mL | Freq: Once | INTRAVENOUS | Status: AC
  Administered 2020-12-16: 1000 mL via INTRAVENOUS

## 2020-12-16 MED ORDER — ROCURONIUM BROMIDE 50 MG/5ML IV SOLN
INTRAVENOUS | Status: AC | PRN
  Administered 2020-12-16: 100 mg via INTRAVENOUS

## 2020-12-16 MED ORDER — POLYETHYLENE GLYCOL 3350 17 G PO PACK
17.0000 g | PACK | Freq: Every day | ORAL | Status: DC
  Administered 2020-12-16 – 2020-12-29 (×12): 17 g
  Filled 2020-12-16 (×14): qty 1

## 2020-12-16 MED ORDER — LEVETIRACETAM IN NACL 1500 MG/100ML IV SOLN
1500.0000 mg | Freq: Two times a day (BID) | INTRAVENOUS | Status: DC
  Filled 2020-12-16 (×2): qty 100

## 2020-12-16 MED ORDER — PHENYTOIN SODIUM 50 MG/ML IJ SOLN
100.0000 mg | Freq: Three times a day (TID) | INTRAMUSCULAR | Status: DC
  Filled 2020-12-16: qty 2

## 2020-12-16 MED ORDER — SODIUM CHLORIDE 0.9 % IV BOLUS
500.0000 mL | Freq: Once | INTRAVENOUS | Status: AC
  Administered 2020-12-16: 500 mL via INTRAVENOUS

## 2020-12-16 MED ORDER — NOREPINEPHRINE 4 MG/250ML-% IV SOLN
2.0000 ug/min | INTRAVENOUS | Status: DC
  Administered 2020-12-17: 2 ug/min via INTRAVENOUS
  Administered 2020-12-18: 4 ug/min via INTRAVENOUS
  Administered 2020-12-19: 2 ug/min via INTRAVENOUS
  Administered 2020-12-20: 20 ug/min via INTRAVENOUS
  Administered 2020-12-20: 8 ug/min via INTRAVENOUS
  Filled 2020-12-16 (×3): qty 250

## 2020-12-16 MED ORDER — POLYETHYLENE GLYCOL 3350 17 G PO PACK: 17.0000 g | PACK | Freq: Every day | ORAL | Status: DC | PRN

## 2020-12-16 MED ORDER — ETOMIDATE 2 MG/ML IV SOLN
INTRAVENOUS | Status: AC | PRN
  Administered 2020-12-16: 20 mg via INTRAVENOUS

## 2020-12-16 MED ORDER — PROPOFOL 10 MG/ML IV BOLUS: 100.0000 mg | Freq: Once | INTRAVENOUS | Status: AC

## 2020-12-16 MED ORDER — SODIUM CHLORIDE 0.9 % IV SOLN
4000.0000 mg | Freq: Once | INTRAVENOUS | Status: AC
  Administered 2020-12-16: 4000 mg via INTRAVENOUS
  Filled 2020-12-16: qty 25

## 2020-12-16 MED ORDER — LACTATED RINGERS IV BOLUS
1000.0000 mL | Freq: Once | INTRAVENOUS | Status: AC
  Administered 2020-12-16: 1000 mL via INTRAVENOUS

## 2020-12-16 MED ORDER — SODIUM CHLORIDE 0.9 % IV SOLN
100.0000 mg | Freq: Three times a day (TID) | INTRAVENOUS | Status: DC
  Filled 2020-12-16 (×2): qty 2

## 2020-12-16 MED ORDER — LORAZEPAM 2 MG/ML IJ SOLN
INTRAMUSCULAR | Status: AC
  Administered 2020-12-16: 4 mg via INTRAVENOUS
  Filled 2020-12-16: qty 2

## 2020-12-16 MED ORDER — NOREPINEPHRINE 4 MG/250ML-% IV SOLN
INTRAVENOUS | Status: AC
  Filled 2020-12-16: qty 250

## 2020-12-16 MED ORDER — SODIUM CHLORIDE 0.9 % IV SOLN: INTRAVENOUS | Status: DC

## 2020-12-16 MED ORDER — ENOXAPARIN SODIUM 40 MG/0.4ML IJ SOSY
40.0000 mg | PREFILLED_SYRINGE | INTRAMUSCULAR | Status: DC
  Administered 2020-12-16 – 2021-01-15 (×31): 40 mg via SUBCUTANEOUS
  Filled 2020-12-16 (×29): qty 0.4

## 2020-12-16 NOTE — H&P (Deleted)
  The note originally documented on this encounter has been moved the the encounter in which it belongs.  

## 2020-12-16 NOTE — Progress Notes (Signed)
STAT LTM started; no initial skin breakdown was seen. [facility] monitoring, event button tested.

## 2020-12-16 NOTE — ED Notes (Signed)
Pt intubated by Starleen Blue MD with positive color change

## 2020-12-16 NOTE — ED Triage Notes (Addendum)
Pt arrives via EMS from home for seizures- pt found by wife in floor unresponsive- pt was given '8mg'$  for versed IV in field by EMS- pt continually seizing despite meds- EMS states hx of drug use and noncompliance with meds

## 2020-12-16 NOTE — ED Notes (Signed)
Report given to April, RN at [facility]M

## 2020-12-16 NOTE — H&P (Signed)
NAME:  Luis Hunter, MRN:  [id number], DOB:  03-15-1976, LOS: 0 ADMISSION DATE:  12/16/2020, CONSULTATION DATE:  12/16/2020  REFERRING MD:  Starleen Blue, EDP [facility], CHIEF COMPLAINT:  seizures, intubated   History of Present Illness:  History is obtained from chart review since patient is currently intubated and sedated. 45 year old man with prior history of seizures and substance abuse found on the floor unresponsive by his wife.  Multiple witnessed seizures by wife and EMS, received 8 mg of IV Versed in the field and continued to seize on arrival to the ED, received 4 mg of IV Ativan, intubated for airway protection and started on propofol drip.  Loaded with Keppra.  Versed drip was added since he continued to have twitching on maximum dose of propofol.  Head CT was negative.  UDS positive for cocaine and THC Transferred to [facility] for continuous EEG monitoring  Pertinent  Medical History  Schizophrenia Substance abuse, cocaine Seizure disorder Bipolar and depression  Significant Hospital Events: Including procedures, antibiotic start and stop dates in addition to other pertinent events   8/13 intubated, transferred to [facility]  Interim History / Subjective:    Objective   Blood pressure 100/68, pulse 77, temperature 98 F (36.7 C), resp. rate (!) 22, weight 83 kg, SpO2 100 %.        Intake/Output Summary (Last 24 hours) at 12/16/2020 1630 Last data filed at 12/16/2020 1309 Gross per 24 hour  Intake 2250 ml  Output --  Net 2250 ml   Filed Weights   12/16/20 1147 12/16/20 1613  Weight: 76 kg 83 kg    Examination: General: Chronically ill-appearing, disheveled man, intubated and sedated, no distress HENT: Pupils pinpoint not reactive to light, mild pallor, no icterus, no JVD Lungs: Bilateral air entry present, no accessory muscle use Cardiovascular: S1-S2 regular Abdomen: Soft, nontender, scaphoid, no organomegaly Extremities: No edema or deformity Neuro: Sedated, RASS -4, on propofol  and fentanyl drips , intermittent twitching movements of both lower extremities GU: Clear urine   Chest x-ray independently reviewed, clear lungs, ET tube in position Resolved Hospital Problem list     Assessment & Plan:  Status epilepticus -loaded with Keppra. Using propofol and Versed drip -titrate as guided by LTM EEG monitoring Propofol increased to max dose of 80, 4 mg Versed given for breakthrough seizures while awaiting LTM EEG If seizures persist, will need to add valproate or Dilantin Neurology to guide anticonvulsants  Acute respiratory failure -intubated for airway protection Vent settings reviewed and adjusted ABG shows mild hypercarbia  Hypotension en route -blood pressure seems to have improved -We will bolus 500 cc normal saline and continue fluids -If blood pressure drops can add Levophed peripherally  Hypokalemia will be repleted  Best Practice (right click and "Reselect all SmartList Selections" daily)   Diet/type: NPO DVT prophylaxis: LMWH GI prophylaxis: PPI Lines: N/A Foley:  Yes, and it is still needed Code Status:  full code Last date of multidisciplinary goals of care discussion [NA]  Labs   CBC: Recent Labs  Lab 12/16/20 1118  WBC 12.1*  NEUTROABS 9.6*  HGB 13.7  HCT 41.0  MCV 90.5  PLT [id number]    Basic Metabolic Panel: Recent Labs  Lab 12/16/20 1118  NA 140  K 3.4*  CL 106  CO2 24  GLUCOSE 102*  BUN 11  CREATININE 0.91  CALCIUM 8.2*  MG 2.1   GFR: Estimated Creatinine Clearance: 108 mL/min (by C-G formula based on SCr of 0.91 mg/dL). Recent Labs  Lab 12/16/20 1118 12/16/20 1454  WBC 12.1*  --   LATICACIDVEN 6.0* 2.7*    Liver Function Tests: Recent Labs  Lab 12/16/20 1118  AST 30  ALT 18  ALKPHOS 51  BILITOT 0.9  PROT 6.5  ALBUMIN 3.6   No results for input(s): LIPASE, AMYLASE in the last 168 hours. No results for input(s): AMMONIA in the last 168 hours.  ABG    Component Value Date/Time   PHART 7.25 (L)  12/16/2020 1117   PCO2ART 50 (H) 12/16/2020 1117   PO2ART 87 12/16/2020 1117   HCO3 21.9 12/16/2020 1117   ACIDBASEDEF 5.7 (H) 12/16/2020 1117   O2SAT 94.9 12/16/2020 1117     Coagulation Profile: No results for input(s): INR, PROTIME in the last 168 hours.  Cardiac Enzymes: No results for input(s): CKTOTAL, CKMB, CKMBINDEX, TROPONINI in the last 168 hours.  HbA1C: Hgb A1c MFr Bld  Date/Time Value Ref Range Status  11/20/2019 12:50 AM 5.8 (H) 4.8 - 5.6 % Final    Comment:    (NOTE) Pre diabetes:          5.7%-6.4%  Diabetes:              >6.4%  Glycemic control for   <7.0% adults with diabetes     CBG: Recent Labs  Lab 12/16/20 1101  GLUCAP 132*    Review of Systems:   Unable to obtain since intubated and sedated  Past Medical History:  He,  has a past medical history of Bipolar disorder ([place]), Depression, Schizophrenia ([place]), and Seizures ([place]).   Surgical History:   Past Surgical History:  Procedure Laterality Date   FINGER SURGERY       Social History:   reports that he has been smoking. He has never used smokeless tobacco. He reports current drug use. Drugs: Marijuana and Cocaine. He reports that he does not drink alcohol.   Family History:  His family history is not on file.   Allergies No Known Allergies   Home Medications  Prior to Admission medications   Medication Sig Start Date End Date Taking? Authorizing Provider  ARIPiprazole ER (ABILIFY MAINTENA) 400 MG PRSY prefilled syringe Inject 400 mg as directed every 30 (thirty) days.  11/08/15   [provider]  levETIRAcetam (KEPPRA) 750 MG tablet Take 2 tablets (1,500 mg total) by mouth 2 (two) times daily. 10/05/20 01/03/21  Duffy Bruce, MD     Critical care time: [place]. [facility] Pager : [phone]  If no response to pager , please call [phone] until 7 pm After 7:00 pm call Elink  [phone]   12/16/2020

## 2020-12-16 NOTE — Consult Note (Signed)
NEURO HOSPITALIST CONSULT NOTE   Requesting physician: Dr. Starleen Blue  Reason for Consult: Status epilepticus  History obtained from:  MD and Chart     HPI:                                                                                                                                          Luis Hunter is an 45 y.o. male with a PMHx of bipolar disorder, depression, schizophrenia, polysubstance use and seizures who presents from home via EMS for seizures. The patient was found by wife on the floor unresponsive. Multiple seizures were then witnessed. EMS was called and on arrival the patient was having a GTC seizure. A total of 8 mg IV Versed was given in the field, which improved his tonic-clonic activity. The patient continued to seize despite Versed administration. Per EMS, the patient has a history of drug use and noncompliance with medications. On arrival to the ED he had questionable shaking of his extremities, but no definite tonic-clonic activity. Ativan 4 mg IV was administered. Due to sonorous respirations and AMS requiring manual ventilation with bag-valve-mask, he was intubated emergently and started on propofol gtt. Keppra 4000 mg was loaded. In CT the patient exhibited continued equivocal twitching, and propofol rate was increased to 80.   UDS was positive for cocaine and cannabinoids.   Per medication list in Epic, the patient is prescribed with Keppra 1500 mg po BID at home.   CT head: No acute intracranial findings   Past Medical History:  Diagnosis Date   Bipolar disorder ([place])    Depression    Schizophrenia ([place])    Seizures ([place])     Past Surgical History:  Procedure Laterality Date   FINGER SURGERY      No family history on file.            Social History:  reports that he has been smoking. He has never used smokeless tobacco. He reports current drug use. Drugs: Marijuana and Cocaine. He reports that he does not drink alcohol.  No Known  Allergies  MEDICATIONS:                                                                                                                      No  current facility-administered medications on file prior to encounter.   Current Outpatient Medications on File Prior to Encounter  Medication Sig Dispense Refill   ARIPiprazole ER (ABILIFY MAINTENA) 400 MG PRSY prefilled syringe Inject 400 mg as directed every 30 (thirty) days.      levETIRAcetam (KEPPRA) 750 MG tablet Take 2 tablets (1,500 mg total) by mouth 2 (two) times daily. 120 tablet 2      ROS:                                                                                                                                       Unable to obtain due to intubation/sedation.    Blood pressure (!) 192/104, pulse 75, resp. rate 19, SpO2 100 %.   General Examination:                                                                                                       Physical Exam  HEENT-  [place]/AT   Lungs- Intubated and sedated Extremities- No edema   Neurological Examination Mental Status: Intubated and sedated with no responses to any external stimuli. GCS 3t in the context of heavy sedation with propofol at a rate of 80.  Cranial Nerves: II: No blink to threat. Sluggishly reactive pupils 2 mm bilaterally.  III,IV, VI: Eyes conjugately at the midline. No doll's eye reflex. V,VII: No corneal reflexes VIII - XII: Unable to assess  Motor/Sensory: BUE without purposeful movement (sedated).  LUE with mildly increased tone and will flex slightly after being laid flat on the bed, with occasional subtle tremoring.  RUE with decreased tone. No movement to noxious.  LLE with severely increased extensor tone that cannot be overcome by examiner. Minimal adduction at hip to noxious plantar stimulation.  RLE with moderately increased extensor tone. No withdrawal to noxious.  Deep Tendon Reflexes: Brisk, low amplitude reflexes x 4 Plantars: Mute  bilaterally  Cerebellar/Gait: Unable to assess Other: Subtle LUE posturing and tremoring noted.    Lab Results: Basic Metabolic Panel: No results for input(s): NA, K, CL, CO2, GLUCOSE, BUN, CREATININE, CALCIUM, MG, PHOS in the last 168 hours.  CBC: Recent Labs  Lab 12/16/20 1118  WBC 12.1*  NEUTROABS 9.6*  HGB 13.7  HCT 41.0  MCV 90.5  PLT 214    Cardiac Enzymes: No results for input(s): CKTOTAL, CKMB, CKMBINDEX, TROPONINI in the last 168 hours.  Lipid Panel: No results for input(s): CHOL, TRIG, HDL, CHOLHDL, VLDL, LDLCALC in the last 168  hours.  Imaging: No results found.   Assessment: 45 year old male presenting in status epilepticus. He has a history of bipolar disorder, depression, schizophrenia, polysubstance use and seizures.  Per EMS, the patient has a history of drug use and noncompliance with medications. The patient was intubated emergently and started on propofol gtt.  1. Keppra 4000 mg was loaded in the ED.  2. In CT the patient exhibited continued equivocal twitching, and propofol rate was increased to 80.  3. UDS was positive for cocaine and cannabinoids.  4. Per medication list in Epic, the patient is prescribed with Keppra 1500 mg po BID at home.  5. CT head: No acute intracranial findings 6. Na and Mg normal. Ca slightly low but not felt to be sufficient to result in seizures.  7. Etiology for seizures most likely his underlying seizure disorder in conjunction with possible medication noncompliance as well as recent cocaine use.   Recommendations: 1. Given 10 of IV Versed and started on Versed drip after neurology exam revealed subtle LUE posturing and tremoring.  2. Transfer to [facility] for STAT LTM EEG.  3. Continue propofol gtt at a rate of 80.  4. Additional Versed 10 mg bolus with increased gtt by intervals of 10 mg/hr for breakthrough seizure activity.  5. If seizure activity recurs, will also load with VPA 20 mg/kg.  6. Inpatient seizure precautions.   7. Will need to discuss outpatient seizure precautions and activity restrictions after the patient is awake and conversant.  8. Continue Keppra at 1500 mg IV BID.   40 minutes spent in the emergent neurological evaluation and management of this critically ill patient.   Electronically signed: Dr. Kerney Elbe 12/16/2020, 11:46 AM

## 2020-12-16 NOTE — ED Notes (Signed)
Report given to [facility] 

## 2020-12-16 NOTE — ED Notes (Signed)
Called respiratory to go to CT

## 2020-12-16 NOTE — ED Notes (Signed)
Attempted to give report- was told the Dr would call back

## 2020-12-16 NOTE — Consult Note (Signed)
Neurology Consultation  Reason for Consult: Transfer for management of status epilepticus Referring Physician: Dr. Cheral Marker at [facility], Dr. Elsworth Soho at [facility]  CC: Status epilepticus  History is obtained from: Review  HPI: Luis Hunter is a 45 y.o. male comes in intubated from [facility]-history obtained from the chart-has a past medical history of bipolar disorder, depression, schizophrenia, polysubstance abuse and seizures presented to [facility] with seizures.  Per chart review, wife found him unresponsive with multiple seizures that were witnessed.  EMS noted him having a generalized tonic-clonic seizure.  Total of 8 mg of parenteral Versed was given in the field which reduced the tonic-clonic activity but did not stop.  My partner seeing the patient at [facility] hospital was also told that he has a history of noncompliance to medications and drug abuse. In the emergency department at [facility] hospital there was continuing shaking of extremities but no definitive tonic-clonic activity-Ativan was administered.  He became sonorous and much more altered requiring intubation for airway protection. He was started on propofol drip, Keppra 4000 mg IV load was given. He was taken for imaging-in the scanner he continued to have some shaking for which she received an increased dosage of propofol drip at the rate of 80 mics per KG per minute.  He was also started on Versed-currently at 10 mg/h.  UDS positive for cocaine and cannabinoids Mild leukocytosis Lactic acidosis noted  Due to ongoing concern for seizure activity, patient transferred to [facility] for continuous EEG and management of status epilepticus    ROS:  Unable to obtain due to altered mental status.   Past Medical History:  Diagnosis Date   Bipolar disorder ([place])    Depression    Schizophrenia ([place])    Seizures ([place])         No family history on file.   Social History:    reports that he has been smoking. He has never used smokeless tobacco. He reports current drug use. Drugs: Marijuana and Cocaine. He reports that he does not drink alcohol.  Medications  Current Facility-Administered Medications:    0.9 %  sodium chloride infusion, , Intravenous, Continuous, Alva, Rakesh V, MD   0.9 %  sodium chloride infusion, 250 mL, Intravenous, Continuous, Elsworth Soho, Leanna Sato, MD   acetaminophen (TYLENOL) tablet 650 mg, 650 mg, Oral, Q4H PRN, Rigoberto Noel, MD   docusate sodium (COLACE) capsule 100 mg, 100 mg, Oral, BID PRN, Rigoberto Noel, MD   enoxaparin (LOVENOX) injection 40 mg, 40 mg, Subcutaneous, Q24H, Elsworth Soho, Leanna Sato, MD   norepinephrine (LEVOPHED) 4-5 MG/250ML-% infusion SOLN, , , ,    norepinephrine (LEVOPHED) '4mg'$  in 257m premix infusion, 2-10 mcg/min, Intravenous, Titrated, AElsworth Soho RLeanna Sato MD   pantoprazole sodium (PROTONIX) 40 mg/20 mL oral suspension 40 mg, 40 mg, Per Tube, Daily, ARigoberto Noel MD   polyethylene glycol (MIRALAX / GLYCOLAX) packet 17 g, 17 g, Oral, Daily PRN, ARigoberto Noel MD   potassium chloride (KLOR-CON) packet 40 mEq, 40 mEq, Per Tube, Once, AKara MeadV, MD   sodium chloride 0.9 % bolus 500 mL, 500 mL, Intravenous, Once, ARigoberto Noel MD  Exam: Current vital signs: Ht '5\' 11"'$  (1.803 m)   SpO2 100%   BMI 25.52 kg/m  Vital signs in last 24 hours: Temp:  [98 F (36.7 C)-98.2 F (36.8 C)] 98 F (36.7 C) (08/13 1624) Pulse Rate:  [65-116] 74 (08/13 1645) Resp:  [18-28] 22 (08/13 1645) BP: (  100-192)/(63-111) 101/65 (08/13 1645) SpO2:  [96 %-100 %] 100 % (08/13 1756) FiO2 (%):  [50 %] 50 % (08/13 1756) Weight:  [76 kg-83 kg] 83 kg (08/13 1613) General: Sedated intubated HEENT: Normocephalic/atraumatic Lungs: Clear Cardiovascular: Regular rate rhythm Abdomen nondistended nontender Extremities warm well perfused with intact pulses Neurological exam Sedated intubated on propofol and Versed drips Has intermittent what appears to  be whole body shaking that lasts anywhere from 5 to about 20 seconds. The movements are exacerbated by touch and stimulation as well but can happen without any stimulus as well. Cranial nerve examination pupils equal round reactive to light, extraocular movements difficult to assess given his mentation, does not blink to threat from either side, within the limitation of ET tube disfiguring the face-face appears symmetric. Motor examination: No withdrawal to noxious stimulation.  No spontaneous movements Sensory examination: As above Coordination cannot be assessed GCS 3-patient is comatose  Labs I have reviewed labs in epic and the results pertinent to this consultation are:  CBC    Component Value Date/Time   WBC 12.1 (H) 12/16/2020 1118   RBC 4.53 12/16/2020 1118   HGB 13.7 12/16/2020 1118   HGB 14.6 03/12/2014 1249   HCT 41.0 12/16/2020 1118   HCT 44.5 03/12/2014 1249   PLT 214 12/16/2020 1118   PLT 183 03/12/2014 1249   MCV 90.5 12/16/2020 1118   MCV 90 03/12/2014 1249   MCH 30.2 12/16/2020 1118   MCHC 33.4 12/16/2020 1118   RDW 15.1 12/16/2020 1118   RDW 15.4 (H) 03/12/2014 1249   LYMPHSABS 1.1 12/16/2020 1118   MONOABS 0.9 12/16/2020 1118   EOSABS 0.0 12/16/2020 1118   BASOSABS 0.1 12/16/2020 1118    CMP     Component Value Date/Time   NA 140 12/16/2020 1118   NA 139 03/12/2014 1249   K 3.4 (L) 12/16/2020 1118   K 3.4 (L) 03/12/2014 1249   CL 106 12/16/2020 1118   CL 106 03/12/2014 1249   CO2 24 12/16/2020 1118   CO2 27 03/12/2014 1249   GLUCOSE 102 (H) 12/16/2020 1118   GLUCOSE 127 (H) 03/12/2014 1249   BUN 11 12/16/2020 1118   BUN 14 03/12/2014 1249   CREATININE 0.91 12/16/2020 1118   CREATININE 1.09 11/16/2015 1417   CALCIUM 8.2 (L) 12/16/2020 1118   CALCIUM 8.9 03/12/2014 1249   PROT 6.5 12/16/2020 1118   PROT 7.5 03/12/2014 1249   ALBUMIN 3.6 12/16/2020 1118   ALBUMIN 4.0 03/12/2014 1249   AST 30 12/16/2020 1118   AST 27 03/12/2014 1249   ALT 18  12/16/2020 1118   ALT 30 03/12/2014 1249   ALKPHOS 51 12/16/2020 1118   ALKPHOS 76 03/12/2014 1249   BILITOT 0.9 12/16/2020 1118   BILITOT 0.6 03/12/2014 1249   GFRNONAA >60 12/16/2020 1118   GFRNONAA >60 03/12/2014 1249   GFRNONAA >60 10/13/2012 1601   GFRAA >60 12/07/2019 1052   GFRAA >60 03/12/2014 1249   GFRAA >60 10/13/2012 1601   Lactate 6 on arrival, repeat 2.7.  Imaging I have reviewed the images obtained:  CT-head does not exhibit any acute intracranial findings.  Assessment: 45 year old with status epilepticus-has a history of bipolar disorder, depression, schizophrenia polysubstance abuse and seizures with noncompliance to medications. Emergently intubated and started on propofol drip after a load of Keppra was administered. Due to equivocal shaking of whole body-concern for ongoing seizure activity for which Versed was added-received 10 mg IV followed by 10 mg/h drip. Transported to [facility] for continuous EEG and further management.   Clinically continues to have these equivocal shaking movements which could be seizures.  UDS positive for cocaine and cannabinoids-could be a provoking or inciting factor for lowering threshold in a patient with known seizure disorder and medication noncompliance.  Impression: Multiple seizures Status epilepticus History of noncompliance to medications Polysubstance abuse  Recommendations: -Stat hookup for LTM EEG-technician at bedside -Increase propofol to 80-it was reduced to 40 on the way because of blood pressures being systolic in the [id number]. -Keep pressors at the bedside-Levophed ready-PCCM attending managing blood pressures-appreciate assistance. -Fosphenytoin 20 mg/kg phenytoin equivalent load followed by checking the level in 2 hours. -Start Dilantin 100 3 times daily in 8 hours -Continue Keppra 500 twice daily. -If EEG shows ongoing seizures-will add Vimpat in addition to increasing propofol and  Versed. -Appearance of some of this shaking is also concerning for myoclonus-no report of loss of pulses to suspect anoxic injury.  MRI at some point once he is stable would be helpful.  That would not be possible while we have EEG leads on but is something to be kept in mind.  Plan discussed with Dr. Elsworth Soho and the PCCM team on the unit  Will handoff care to the oncoming neuro hospitalist.  Will follow in the morning.  -- Amie Portland, MD Neurologist [facility] Pager: [phone]  CRITICAL CARE ATTESTATION Performed by: Amie Portland, MD Total critical care time: 45 minutes Critical care time was exclusive of separately billable procedures and treating other patients and/or supervising APPs/Residents/Students Critical care was necessary to treat or prevent imminent or life-threatening deterioration due to status epilepticus This patient is critically ill and at significant risk for neurological worsening and/or death and care requires constant monitoring. Critical care was time spent personally by me on the following activities: development of treatment plan with patient and/or surrogate as well as nursing, discussions with consultants, evaluation of patient's response to treatment, examination of patient, obtaining history from patient or surrogate, ordering and performing treatments and interventions, ordering and review of laboratory studies, ordering and review of radiographic studies, pulse oximetry, re-evaluation of patient's condition, participation in multidisciplinary rounds and medical decision making of high complexity in the care of this patient.

## 2020-12-16 NOTE — ED Notes (Signed)
ET tube advanced to 26 at the lip

## 2020-12-16 NOTE — ED Provider Notes (Addendum)
[facility]  ____________________________________________   Event Date/Time   First MD Initiated Contact with Patient 12/16/20 1108     (approximate)  I have reviewed the triage vital signs and the nursing notes.   HISTORY  Chief Complaint Unresponsive    HPI Luis Hunter is a 45 y.o. male with past medical history of seizure disorder, polysubstance use, bipolar disorder who presents after multiple seizures at home.  Per EMS patient's family had said that he had had multiple seizures this morning.  When they arrived he was having generalized tonic-clonic activity.  IV was placed and he was given 8 of Versed which seemed to improve his tonic-clonic activity.  I am unable to obtain additional history regarding onset, quality, timing or associated symptoms as the patient is unresponsive.         Past Medical History:  Diagnosis Date   Bipolar disorder ([place])    Depression    Schizophrenia ([place])    Seizures ([place])     Patient Active Problem List   Diagnosis Date Noted   Polysubstance abuse ([place]) 11/19/2019   Schizophrenia ([place])    Bipolar disorder ([place])    Elevated troponin    Unresponsiveness    Tobacco abuse    Leukocytosis    Seizure ([place]) 06/05/2017   BPPV (benign paroxysmal positional vertigo) 11/16/2015   Dizziness 11/16/2015    Past Surgical History:  Procedure Laterality Date   FINGER SURGERY      Prior to Admission medications   Medication Sig Start Date End Date Taking? Authorizing Provider  ARIPiprazole ER (ABILIFY MAINTENA) 400 MG PRSY prefilled syringe Inject 400 mg as directed every 30 (thirty) days.  11/08/15   [provider]  levETIRAcetam (KEPPRA) 750 MG tablet Take 2 tablets (1,500 mg total) by mouth 2 (two) times daily. 10/05/20 01/03/21  Duffy Bruce, MD    Allergies Patient has no known allergies.  No family history on file.  Social History Social History   Tobacco Use   Smoking status: Every Day    Smokeless tobacco: Never  Substance Use Topics   Alcohol use: No    Alcohol/week: 0.0 standard drinks   Drug use: Yes    Types: Marijuana, Cocaine    Review of Systems   Review of Systems  Unable to perform ROS: Patient unresponsive   Physical Exam Updated Vital Signs BP (!) 147/80   Pulse 65   Temp 98.2 F (36.8 C) (Axillary)   Resp (!) 21   Wt (!) 167.5 kg   SpO2 99%   BMI 51.50 kg/m   Physical Exam Constitutional:      Appearance: Normal appearance.  HENT:     Head: Normocephalic and atraumatic.     Nose: Nose normal.  Eyes:     General: No scleral icterus.    Comments: Pupils are 3 to 2 mm and symmetric bilateral, no gaze deviation  Cardiovascular:     Rate and Rhythm: Tachycardia present.  Pulmonary:     Comments: Nasal trumpet in place, sonorous respirations Abdominal:     General: Abdomen is flat.     Tenderness: There is no abdominal tenderness. There is no guarding.  Genitourinary:    Penis: Normal.   Musculoskeletal:        General: No swelling or tenderness.     Cervical back: Normal range of motion.  Skin:    General: Skin is warm and dry.     Capillary Refill: Capillary refill takes less than  2 seconds.  Neurological:     Comments: Patient has random jerking movements of his bilateral upper extremities No purposeful movement, no response to pain No verbal response  Psychiatric:     Comments: Unable to assess     LABS (all labs ordered are listed, but only abnormal results are displayed)  Labs Reviewed  COMPREHENSIVE METABOLIC PANEL - Abnormal; Notable for the following components:      Result Value   Potassium 3.4 (*)    Glucose, Bld 102 (*)    Calcium 8.2 (*)    All other components within normal limits  CBC WITH DIFFERENTIAL/PLATELET - Abnormal; Notable for the following components:   WBC 12.1 (*)    Neutro Abs 9.6 (*)    Abs Immature Granulocytes 0.33 (*)    All other components within normal limits  LACTIC ACID, PLASMA -  Abnormal; Notable for the following components:   Lactic Acid, Venous 6.0 (*)    All other components within normal limits  URINALYSIS, COMPLETE (UACMP) WITH MICROSCOPIC - Abnormal; Notable for the following components:   Color, Urine STRAW (*)    APPearance CLEAR (*)    Hgb urine dipstick MODERATE (*)    Protein, ur 30 (*)    Bacteria, UA RARE (*)    All other components within normal limits  URINE DRUG SCREEN, QUALITATIVE ([facility] ONLY) - Abnormal; Notable for the following components:   Cocaine Metabolite,Ur [place] POSITIVE (*)    Cannabinoid 50 Ng, Ur [place] POSITIVE (*)    Benzodiazepine, Ur [place] POSITIVE (*)    All other components within normal limits  BLOOD GAS, ARTERIAL - Abnormal; Notable for the following components:   pH, Arterial 7.25 (*)    pCO2 arterial 50 (*)    Acid-base deficit 5.7 (*)    All other components within normal limits  CBG MONITORING, ED - Abnormal; Notable for the following components:   Glucose-Capillary 132 (*)    All other components within normal limits  RESP PANEL BY RT-PCR (FLU A&B, COVID) ARPGX2  ETHANOL  MAGNESIUM  LACTIC ACID, PLASMA   ____________________________________________  EKG  Normal sinus rhythm, normal axis, peaked T waves, ectopy ____________________________________________  RADIOLOGY I, Madelin Headings, personally viewed and evaluated these images (plain radiographs) as part of my medical decision making, as well as reviewing the written report by the radiologist.  ED MD interpretation: I reviewed the chest x-ray which shows the ET tube was initially high but on repeat chest x-ray it is in appropriate position CT head without acute findings    ____________________________________________   PROCEDURES  Procedure(s) performed (including Critical Care):  Procedure Name: Intubation Date/Time: 12/16/2020 4:09 PM Performed by: Rada Hay, MD Pre-anesthesia Checklist: Patient identified Oxygen Delivery Method: Ambu  bag Preoxygenation: Pre-oxygenation with 100% oxygen Induction Type: Rapid sequence Ventilation: Mask ventilation without difficulty Grade View: Grade IV Tube size: 8.0 mm Number of attempts: 1 Airway Equipment and Method: Video-laryngoscopy Placement Confirmation: Positive ETCO2 and Breath sounds checked- equal and bilateral Secured at: 24 cm Tube secured with: ETT holder Difficulty Due To: Difficulty was unanticipated    .Critical Care  Date/Time: 12/16/2020 4:10 PM Performed by: Rada Hay, MD Authorized by: Rada Hay, MD   Critical care provider statement:    Critical care time (minutes):  120   Critical care time was exclusive of:  Separately billable procedures and treating other patients   Critical care was necessary to treat or prevent imminent or life-threatening deterioration of the following  conditions:  CNS failure or compromise   Critical care was time spent personally by me on the following activities:  Discussions with consultants, evaluation of patient's response to treatment, examination of patient, ordering and performing treatments and interventions, ordering and review of laboratory studies, ordering and review of radiographic studies, pulse oximetry, re-evaluation of patient's condition, review of old charts and development of treatment plan with patient or surrogate   I assumed direction of critical care for this patient from another provider in my specialty: no     Care discussed with: accepting provider at another facility     ____________________________________________   INITIAL IMPRESSION / ASSESSMENT AND PLAN / ED COURSE     This is a 45 year old male with a history of seizure disorder on Keppra who presents with status epilepticus.  Generalized tonic-clonic activity witnessed by EMS for about 15 minutes, 8 of IV Versed in the field.  On arrival patient has questionable shaking of his extremities but does not have any tonic-clonic  activity.  He has a nasal trumpet in place and is actively being bagged with sonorous respirations and he has no mental status.  Patient given 4 of IV Ativan fluid started and patient intubated with rocuronium and etomidate.  Patient then given propofol bolus and propofol infusion.  Plan to get labs and CT head.  Will discuss with neurology whether he would require continuous EEG and there for transfer whether he can stay in our ICU with spot EEG.  CT head does not show any acute findings.  ET tube was adjusted and is now in appropriate position.  Labs notable for lactic acidosis otherwise largely within normal limits.  UDS positive for cocaine and cannabinoids.  Patient on maximum propofol drip, noted to be having seizure-like activity and CAT scan.  Per Dr. Charna Archer who was also concerned about ongoing seizure activity patient given 10 of IV Versed and started on Versed drip.  Unfortunately no ICU beds available at [facility] so will transfer ED to ED.  Patient was accepted by Dr. Pearline Cables.   Clinical Course as of 12/16/20 1609  Sat Dec 16, 2020  1218 Cocaine Metabolite,Ur [place](!): POSITIVE [KM]  1218 Cannabinoid 50 Ng, Ur [place](!): POSITIVE [KM]    Clinical Course User Index [KM] Rada Hay, MD     ____________________________________________   FINAL CLINICAL IMPRESSION(S) / ED DIAGNOSES  Final diagnoses:  Status epilepticus [facility])     ED Discharge Orders     None        Note:  This document was prepared using Dragon voice recognition software and may include unintentional dictation errors.    Rada Hay, MD 12/16/20 1330    Rada Hay, MD 12/16/20 [phone]

## 2020-12-16 NOTE — ED Notes (Signed)
Pt began having seizure like activity- called charge and asked her to notify MD

## 2020-12-17 DIAGNOSIS — J9601 Acute respiratory failure with hypoxia: Secondary | ICD-10-CM

## 2020-12-17 DIAGNOSIS — G40901 Epilepsy, unspecified, not intractable, with status epilepticus: Principal | ICD-10-CM

## 2020-12-17 LAB — CBC
HCT: 35.8 % — ABNORMAL LOW (ref 39.0–52.0)
Hemoglobin: 12.2 g/dL — ABNORMAL LOW (ref 13.0–17.0)
MCH: 30.3 pg (ref 26.0–34.0)
MCHC: 34.1 g/dL (ref 30.0–36.0)
MCV: 88.8 fL (ref 80.0–100.0)
Platelets: 215 10*3/uL (ref 150–400)
RBC: 4.03 MIL/uL — ABNORMAL LOW (ref 4.22–5.81)
RDW: 15.3 % (ref 11.5–15.5)
WBC: 13.8 10*3/uL — ABNORMAL HIGH (ref 4.0–10.5)
nRBC: 0 % (ref 0.0–0.2)

## 2020-12-17 LAB — PHENYTOIN LEVEL, TOTAL: Phenytoin Lvl: 15 ug/mL (ref 10.0–20.0)

## 2020-12-17 LAB — GLUCOSE, CAPILLARY
Glucose-Capillary: 93 mg/dL (ref 70–99)
Glucose-Capillary: 83 mg/dL (ref 70–99)
Glucose-Capillary: 86 mg/dL (ref 70–99)
Glucose-Capillary: 77 mg/dL (ref 70–99)
Glucose-Capillary: 68 mg/dL — ABNORMAL LOW (ref 70–99)
Glucose-Capillary: 78 mg/dL (ref 70–99)

## 2020-12-17 LAB — BASIC METABOLIC PANEL
Anion gap: 9 (ref 5–15)
BUN: 11 mg/dL (ref 6–20)
CO2: 21 mmol/L — ABNORMAL LOW (ref 22–32)
Calcium: 8.1 mg/dL — ABNORMAL LOW (ref 8.9–10.3)
Chloride: 109 mmol/L (ref 98–111)
Creatinine, Ser: 1.59 mg/dL — ABNORMAL HIGH (ref 0.61–1.24)
GFR, Estimated: 54 mL/min — ABNORMAL LOW (ref >60)
Glucose, Bld: 105 mg/dL — ABNORMAL HIGH (ref 70–99)
Potassium: 3.5 mmol/L (ref 3.5–5.1)
Sodium: 139 mmol/L (ref 135–145)

## 2020-12-17 LAB — MAGNESIUM: Magnesium: 2 mg/dL (ref 1.7–2.4)

## 2020-12-17 LAB — TRIGLYCERIDES: Triglycerides: 162 mg/dL — ABNORMAL HIGH (ref <150)

## 2020-12-17 LAB — PHOSPHORUS: Phosphorus: 3.1 mg/dL (ref 2.5–4.6)

## 2020-12-17 MED ORDER — POTASSIUM CHLORIDE 20 MEQ PO PACK
40.0000 meq | PACK | Freq: Once | ORAL | Status: AC
  Administered 2020-12-17: 40 meq
  Filled 2020-12-17: qty 2

## 2020-12-17 MED ORDER — VITAL HIGH PROTEIN PO LIQD
1000.0000 mL | ORAL | Status: DC
  Administered 2020-12-17: 1000 mL

## 2020-12-17 MED ORDER — MIDAZOLAM-SODIUM CHLORIDE 100-0.9 MG/100ML-% IV SOLN
30.0000 mg/h | INTRAVENOUS | Status: DC
  Administered 2020-12-17: 4 mg/h via INTRAVENOUS
  Administered 2020-12-17: 8 mg/h via INTRAVENOUS
  Administered 2020-12-18: 10 mg/h via INTRAVENOUS
  Administered 2020-12-18: 4 mg/h via INTRAVENOUS
  Administered 2020-12-19: 10 mg/h via INTRAVENOUS
  Administered 2020-12-19: 30 mg/h via INTRAVENOUS
  Administered 2020-12-19 (×3): 10 mg/h via INTRAVENOUS
  Administered 2020-12-20 (×2): 30 mg/h via INTRAVENOUS
  Filled 2020-12-17 (×10): qty 100

## 2020-12-17 MED ORDER — PROSOURCE TF PO LIQD
45.0000 mL | Freq: Two times a day (BID) | ORAL | Status: DC
  Administered 2020-12-17 – 2020-12-18 (×2): 45 mL
  Filled 2020-12-17 (×2): qty 45

## 2020-12-17 MED ORDER — DEXTROSE 50 % IV SOLN
INTRAVENOUS | Status: AC
  Filled 2020-12-17: qty 50

## 2020-12-17 MED ORDER — FREE WATER
30.0000 mL | Status: DC
  Administered 2020-12-17 – 2020-12-31 (×81): 30 mL

## 2020-12-17 NOTE — Progress Notes (Signed)
LTM maintenance completed; no skin breakdown was seen.Event button tested.

## 2020-12-17 NOTE — Plan of Care (Signed)
On brief review of EEG, around 22:39 there is some rhythmic activity centered at P7 without a clear electric field more frontally. Video shows left arm movement. Seizure vs. Movement artifact, for now will increase versed to 4 mg to err on the side of caution.   Lesleigh Noe MD-PhD [facility] [phone]

## 2020-12-17 NOTE — Progress Notes (Signed)
[facility] ADULT ICU REPLACEMENT PROTOCOL   The patient does apply for the [facility] Adult ICU Electrolyte Replacment Protocol based on the criteria listed below:   1.Exclusion criteria: TCTS patients, ECMO patients and Hypothermia Protocol, and   Dialysis patients 2. Is GFR >/= 30 ml/min? Yes.    Patient's GFR today is >60 3. Is SCr </= 2? Yes.   Patient's SCr is 1.59 mg/dL 4. Did SCr increase >/= 0.5 in 24 hours? No. 5.Pt's weight >40kg  Yes.   6. Abnormal electrolyte(s): K+ 3.5  7. Electrolytes replaced per protocol 8.  Call MD STAT for K+ </= 2.5, Phos </= 1, or Mag </= 1 Physician:  n/a  Darlys Gales 12/17/2020 6:24 AM

## 2020-12-17 NOTE — Procedures (Addendum)
Patient Name: Luis Hunter  MRN: [id number]  Epilepsy Attending: Lora Havens  Referring Physician/Provider: Dr Amie Portland Duration: 12/16/2020 1803 to 12/17/2020 1803  Patient history: 45 year old with status epilepticus-has a history of bipolar disorder, depression, schizophrenia polysubstance abuse and seizures with noncompliance to medications. EEG to evaluate for seizure  Level of alertness: comatose  AEDs during EEG study: LEV, PHT, propofol, Versed  Technical aspects: This EEG study was done with scalp electrodes positioned according to the 10-20 International system of electrode placement. Electrical activity was acquired at a sampling rate of '500Hz'$  and reviewed with a high frequency filter of '70Hz'$  and a low frequency filter of '1Hz'$ . EEG data were recorded continuously and digitally stored.   Description:  EEG initially showed burst suppression pattern with burst of generalized 2-'3hz'$  delta slowing and overriding 15-'18Hz'$  beta activity lasting 2-3 seconds and eeg suppression lasting 8-12 seconds. Gradually, EEG became near continuous and showed 3 to 5 Hz sharply contoured theta-delta slowing in right hemisphere which at times appeared rhythmic with triphasic morphology, no definite evolution was seen.  There was also near continuous 3 to 5 Hz theta - delta slowing in left hemisphere.  Brief 2 to 3 seconds of generalized EEG attenuation were also noted.  IMPRESSION: This study is suggestive of cortical dysfunction in left hemisphere likely secondary to underlying structural abnormality, postictal state.  EEG also showed lateralized rhythmic delta activity which at times appeared sharply contoured without definite evolution and right hemisphere which is suggestive of cortical dysfunction in right hemisphere.  This EEG pattern is on the ictal-interictal continuum with low to intermediate potential for seizure recurrence.  Additionally there is severe diffuse encephalopathy, nonspecific  etiology but likely related to sedation, seizure.  Margaretha Mahan Barbra Sarks

## 2020-12-17 NOTE — Progress Notes (Signed)
Brief Nutrition Note  Consult received for enteral/tube feeding initiation and management.  Adult Enteral Nutrition Protocol initiated. Full assessment to follow.  Admitting Dx: Status epilepticus ([place]) [G40.901]  Body mass index is 25.74 kg/m. Pt meets criteria for overweight based on current BMI.  Labs: Recent Labs  Lab 12/16/20 1118 12/16/20 1846 12/17/20 0503  NA 140 140 139  K 3.4* 2.9* 3.5  CL 106  --  109  CO2 24  --  21*  BUN 11  --  11  CREATININE 0.91  --  1.59*  CALCIUM 8.2*  --  8.1*  MG 2.1  --  2.0  PHOS  --   --  3.1  GLUCOSE 102*  --  105*    Ranell Patrick, RD, LDN Clinical Dietitian Pager on Amion

## 2020-12-17 NOTE — Progress Notes (Signed)
Neurology Progress Note   S:// Seen ane examined. No sz overnight per RN EEG read slowing only   O:// Current vital signs: BP (!) 93/58   Pulse 77   Temp 98.3 F (36.8 C) (Axillary)   Resp 18   Ht '5\' 11"'  (1.803 m)   Wt 83.7 kg   SpO2 97%   BMI 25.74 kg/m  Vital signs in last 24 hours: Temp:  [97.4 F (36.3 C)-99.8 F (37.7 C)] 98.3 F (36.8 C) (08/14 0737) Pulse Rate:  [57-116] 77 (08/14 0400) Resp:  [8-28] 18 (08/14 0400) BP: (86-192)/(54-115) 93/58 (08/14 0400) SpO2:  [96 %-100 %] 97 % (08/14 0810) FiO2 (%):  [40 %-50 %] 40 % (08/14 0810) Weight:  [76 kg-83.7 kg] 83.7 kg (08/14 0240) General: Sedated intubated - propofol, versed drips HEENT: Normocephalic/atraumatic Lungs: Clear Cardiovascular: Regular rate rhythm Abdomen nondistended nontender Extremities warm well perfused with intact pulses Neurological exam Sedated intubated on propofol and Versed drips No shivering or sz like movements seen today Cranial nerve examination pupils equal round reactive to light, extraocular movements difficult to assess given his mentation, does not blink to threat from either side, within the limitation of ET tube disfiguring the face-face appears symmetric. Motor examination: No withdrawal to noxious stimulation.  No spontaneous movements Sensory examination: As above Coordination cannot be assessed  Medications  Current Facility-Administered Medications:    0.9 %  sodium chloride infusion, , Intravenous, Continuous, Kara Mead V, MD, Last Rate: 100 mL/hr at 12/17/20 0600, Infusion Verify at 12/17/20 0600   0.9 %  sodium chloride infusion, 250 mL, Intravenous, Continuous, Rigoberto Noel, MD   acetaminophen (TYLENOL) 160 MG/5ML solution 650 mg, 650 mg, Per Tube, Q4H PRN, Rigoberto Noel, MD   chlorhexidine gluconate (MEDLINE KIT) (PERIDEX) 0.12 % solution 15 mL, 15 mL, Mouth Rinse, BID, Kara Mead V, MD, 15 mL at 12/17/20 0747   Chlorhexidine Gluconate Cloth 2 % PADS 6  each, 6 each, Topical, Daily, Kara Mead V, MD, 6 each at 12/16/20 2106   docusate (COLACE) 50 MG/5ML liquid 100 mg, 100 mg, Per Tube, BID PRN, Rigoberto Noel, MD   enoxaparin (LOVENOX) injection 40 mg, 40 mg, Subcutaneous, Q24H, Kara Mead V, MD, 40 mg at 12/16/20 2025   fentaNYL (SUBLIMAZE) injection 50 mcg, 50 mcg, Intravenous, Q15 min PRN, Rigoberto Noel, MD   fentaNYL (SUBLIMAZE) injection 50-200 mcg, 50-200 mcg, Intravenous, Q30 min PRN, Rigoberto Noel, MD   levETIRAcetam (KEPPRA) IVPB 500 mg/100 mL premix, 500 mg, Intravenous, Q12H, Amie Portland, MD, Paused at 12/16/20 2038   MEDLINE mouth rinse, 15 mL, Mouth Rinse, 10 times per day, Rigoberto Noel, MD, 15 mL at 12/17/20 0548   midazolam (VERSED) 100 mg/100 mL (1 mg/mL) premix infusion, 0.5-10 mg/hr, Intravenous, Continuous, Amie Portland, MD, Last Rate: 10 mL/hr at 12/17/20 0600, 10 mg/hr at 12/17/20 0600   norepinephrine (LEVOPHED) 64m in 255mpremix infusion, 2-10 mcg/min, Intravenous, Titrated, AlRigoberto NoelMD, Stopped at 12/17/20 0251   pantoprazole sodium (PROTONIX) 40 mg/20 mL oral suspension 40 mg, 40 mg, Per Tube, Daily, AlKara Mead, MD, 40 mg at 12/16/20 2054   phenytoin (DILANTIN) injection 100 mg, 100 mg, Intravenous, Q8H, AlKara Mead, MD, 100 mg at 12/17/20 0548   polyethylene glycol (MIRALAX / GLYCOLAX) packet 17 g, 17 g, Per Tube, Daily PRN, AlRigoberto NoelMD   polyethylene glycol (MIRALAX / GLYCOLAX) packet 17 g, 17 g, Per Tube, Daily, AlRigoberto NoelMD, 17  g at 12/16/20 2029   propofol (DIPRIVAN) 1000 MG/100ML infusion, 5-80 mcg/kg/min, Intravenous, Continuous, Amie Portland, MD, Last Rate: 39.8 mL/hr at 12/17/20 0729, 80 mcg/kg/min at 12/17/20 0729 Labs CBC    Component Value Date/Time   WBC 13.8 (H) 12/17/2020 0503   RBC 4.03 (L) 12/17/2020 0503   HGB 12.2 (L) 12/17/2020 0503   HGB 14.6 03/12/2014 1249   HCT 35.8 (L) 12/17/2020 0503   HCT 44.5 03/12/2014 1249   PLT 215 12/17/2020 0503   PLT 183  03/12/2014 1249   MCV 88.8 12/17/2020 0503   MCV 90 03/12/2014 1249   MCH 30.3 12/17/2020 0503   MCHC 34.1 12/17/2020 0503   RDW 15.3 12/17/2020 0503   RDW 15.4 (H) 03/12/2014 1249   LYMPHSABS 1.1 12/16/2020 1118   MONOABS 0.9 12/16/2020 1118   EOSABS 0.0 12/16/2020 1118   BASOSABS 0.1 12/16/2020 1118    CMP     Component Value Date/Time   NA 139 12/17/2020 0503   NA 139 03/12/2014 1249   K 3.5 12/17/2020 0503   K 3.4 (L) 03/12/2014 1249   CL 109 12/17/2020 0503   CL 106 03/12/2014 1249   CO2 21 (L) 12/17/2020 0503   CO2 27 03/12/2014 1249   GLUCOSE 105 (H) 12/17/2020 0503   GLUCOSE 127 (H) 03/12/2014 1249   BUN 11 12/17/2020 0503   BUN 14 03/12/2014 1249   CREATININE 1.59 (H) 12/17/2020 0503   CREATININE 1.09 11/16/2015 1417   CALCIUM 8.1 (L) 12/17/2020 0503   CALCIUM 8.9 03/12/2014 1249   PROT 6.5 12/16/2020 1118   PROT 7.5 03/12/2014 1249   ALBUMIN 3.6 12/16/2020 1118   ALBUMIN 4.0 03/12/2014 1249   AST 30 12/16/2020 1118   AST 27 03/12/2014 1249   ALT 18 12/16/2020 1118   ALT 30 03/12/2014 1249   ALKPHOS 51 12/16/2020 1118   ALKPHOS 76 03/12/2014 1249   BILITOT 0.9 12/16/2020 1118   BILITOT 0.6 03/12/2014 1249   GFRNONAA 54 (L) 12/17/2020 0503   GFRNONAA >60 03/12/2014 1249   GFRNONAA >60 10/13/2012 1601   GFRAA >60 12/07/2019 1052   GFRAA >60 03/12/2014 1249   GFRAA >60 10/13/2012 1601  Dilantin level 15  Imaging I have reviewed images in epic and the results pertinent to this consultation are: No new imaging  Assessment: 45 year old with status epilepticus-has a history of bipolar disorder, depression, schizophrenia polysubstance abuse and seizures with noncompliance to medications. Emergently intubated and started on propofol drip after a load of Keppra was administered. Due to equivocal shaking of whole body-concern for ongoing seizure activity for which Versed was added-received 10 mg IV followed by 10 mg/h drip. Transported to [facility]  for continuous EEG and further management.     Clinically continued to have these equivocal shaking movements which could be seizures. Spot EEG unremarkable for seizures. LTM-  UDS positive for cocaine and cannabinoids-could be a provoking or inciting factor for lowering threshold in a patient with known seizure disorder and medication noncompliance.   Impression: Multiple seizures Status epilepticus History of noncompliance to medications Polysubstance abuse   Recommendations: -Continue Keppra and Dilantin -Continue LTM -Start reducing Versed by 1 mg every hour until it is discontinued -I would also reduce propofol to 60 -Seizure precautions -Management of medical comorbidities per primary team as you are -MRI once stable and EEG leads off. Plan discussed with Dr. Shearon Stalls  -- Amie Portland, MD Neurologist [facility] Pager: [phone]  [place] Performed by: Amie Portland,  MD Total critical care time: 39 minutes Critical care time was exclusive of separately billable procedures and treating other patients and/or supervising APPs/Residents/Students Critical care was necessary to treat or prevent imminent or life-threatening deterioration due to status epilepticus  This patient is critically ill and at significant risk for neurological worsening and/or death and care requires constant monitoring. Critical care was time spent personally by me on the following activities: development of treatment plan with patient and/or surrogate as well as nursing, discussions with consultants, evaluation of patient's response to treatment, examination of patient, obtaining history from patient or surrogate, ordering and performing treatments and interventions, ordering and review of laboratory studies, ordering and review of radiographic studies, pulse oximetry, re-evaluation of patient's condition, participation in multidisciplinary rounds and medical decision making of  high complexity in the care of this patient.

## 2020-12-17 NOTE — Progress Notes (Signed)
NAME:  Luis Hunter, MRN:  [id number], DOB:  [birth year], LOS: 1 ADMISSION DATE:  12/16/2020, CONSULTATION DATE:  12/17/2020  REFERRING MD:  Starleen Blue, EDP [facility], CHIEF COMPLAINT:  seizures, intubated   History of Present Illness:  History is obtained from chart review since patient is currently intubated and sedated. 45 year old man with prior history of seizures and substance abuse found on the floor unresponsive by his wife.  Multiple witnessed seizures by wife and EMS, received 8 mg of IV Versed in the field and continued to seize on arrival to the ED, received 4 mg of IV Ativan, intubated for airway protection and started on propofol drip.  Loaded with Keppra.  Versed drip was added since he continued to have twitching on maximum dose of propofol.  Head CT was negative.  UDS positive for cocaine and THC Transferred to [facility] for continuous EEG monitoring  Pertinent  Medical History  Schizophrenia Substance abuse, cocaine Seizure disorder Bipolar and depression  Significant Hospital Events: Including procedures, antibiotic start and stop dates in addition to other pertinent events   8/13 intubated, transferred to [facility]  Interim History / Subjective:   No overnight issues. Started on LTM.  Objective   Blood pressure (!) 93/58, pulse 77, temperature 98.3 F (36.8 C), temperature source Axillary, resp. rate 18, height '5\' 11"'$  (1.803 m), weight 83.7 kg, SpO2 97 %.    Vent Mode: PRVC FiO2 (%):  [40 %-50 %] 40 % Set Rate:  [18 bmp] 18 bmp Vt Set:  [450 mL-600 mL] 600 mL PEEP:  [5 cmH20] 5 cmH20 Plateau Pressure:  [14 cmH20-18 cmH20] 14 cmH20   Intake/Output Summary (Last 24 hours) at 12/17/2020 0946 Last data filed at 12/17/2020 0740 Gross per 24 hour  Intake 1999.15 ml  Output 1825 ml  Net 174.15 ml   Filed Weights   12/16/20 1845 12/17/20 0240  Weight: 81.3 kg 83.7 kg    Examination: General: chronically ill, deeply sedated HENT: ETT to vent, pupils pinpoint and reactive Lungs:   nonlabored breathing on vent Cardiovascular: RRR no mrg Abdomen: Soft, nontender, scaphoid, no organomegaly Extremities: No edema or deformity Neuro: sedated, not responsive, RASS -4   Na 139 K 3.5, Cr 1.59 WBC 13.8 Hgb 12.2 Glucose 83  Resolved Hospital Problem list   Hypotension Hypokalemia   Assessment & Plan:  Status epilepticus  -loaded with Keppra. Also on versed and propofol while on LTM>  If seizures persist, will need to add valproate or Dilantin Neurology to guide anticonvulsants, discussed with them personally.  Continue current meds for now, follow up EEG.  - history of drug withdrawal related seizures in the past.   Acute respiratory failure -intubated for airway protection, clinical coma - maintain LTVV - not appropriate for SBT   Best Practice (right click and "Reselect all SmartList Selections" daily)   Diet/type: NPO will start tube feeds today.  DVT prophylaxis: LMWH GI prophylaxis: PPI Lines: N/A Foley:  Yes, and it is still needed Code Status:  full code Last date of multidisciplinary goals of care discussion [significant other at bedside, updated]  Labs   CBC: Recent Labs  Lab 12/16/20 1118 12/16/20 1846 12/17/20 0503  WBC 12.1*  --  13.8*  NEUTROABS 9.6*  --   --   HGB 13.7 11.6* 12.2*  HCT 41.0 34.0* 35.8*  MCV 90.5  --  88.8  PLT 214  --  123456    Basic Metabolic Panel: Recent Labs  Lab 12/16/20 1118 12/16/20 1846 12/17/20 0503  NA 140 140 139  K 3.4* 2.9* 3.5  CL 106  --  109  CO2 24  --  21*  GLUCOSE 102*  --  105*  BUN 11  --  11  CREATININE 0.91  --  1.59*  CALCIUM 8.2*  --  8.1*  MG 2.1  --  2.0  PHOS  --   --  3.1   GFR: Estimated Creatinine Clearance: 61.8 mL/min (A) (by C-G formula based on SCr of 1.59 mg/dL (H)). Recent Labs  Lab 12/16/20 1118 12/16/20 1454 12/17/20 0503  WBC 12.1*  --  13.8*  LATICACIDVEN 6.0* 2.7*  --     Liver Function Tests: Recent Labs  Lab 12/16/20 1118  AST 30  ALT 18   ALKPHOS 51  BILITOT 0.9  PROT 6.5  ALBUMIN 3.6   No results for input(s): LIPASE, AMYLASE in the last 168 hours. No results for input(s): AMMONIA in the last 168 hours.  ABG    Component Value Date/Time   PHART 7.436 12/16/2020 1846   PCO2ART 33.4 12/16/2020 1846   PO2ART 80 (L) 12/16/2020 1846   HCO3 22.6 12/16/2020 1846   TCO2 24 12/16/2020 1846   ACIDBASEDEF 1.0 12/16/2020 1846   O2SAT 96.0 12/16/2020 1846     Coagulation Profile: No results for input(s): INR, PROTIME in the last 168 hours.  Cardiac Enzymes: No results for input(s): CKTOTAL, CKMB, CKMBINDEX, TROPONINI in the last 168 hours.  HbA1C: Hgb A1c MFr Bld  Date/Time Value Ref Range Status  11/20/2019 12:50 AM 5.8 (H) 4.8 - 5.6 % Final    Comment:    (NOTE) Pre diabetes:          5.7%-6.4%  Diabetes:              >6.4%  Glycemic control for   <7.0% adults with diabetes     CBG: Recent Labs  Lab 12/16/20 1101 12/16/20 1938 12/16/20 2315 12/17/20 0320 12/17/20 0715  GLUCAP 132* 87 95 93 83   The patient is critically ill due to status epilepticus, respiratory failure.  Critical care was necessary to treat or prevent imminent or life-threatening deterioration.  Critical care was time spent personally by me on the following activities: development of treatment plan with patient and/or surrogate as well as nursing, discussions with consultants, evaluation of patient's response to treatment, examination of patient, obtaining history from patient or surrogate, ordering and performing treatments and interventions, ordering and review of laboratory studies, ordering and review of radiographic studies, pulse oximetry, re-evaluation of patient's condition and participation in multidisciplinary rounds.   Critical Care Time devoted to patient care services described in this note is 32 minutes. This time reflects time of care of this [place] . This critical care time does not reflect separately  billable procedures or procedure time, teaching time or supervisory time of PA/NP/Med student/Med Resident etc but could involve care discussion time.       Spero Geralds [facility] 12/17/2020 9:47 AM  Pager: see AMION  If no response to pager , please call critical care on call (see AMION) until 7pm After 7:00 pm call Elink

## 2020-12-18 ENCOUNTER — Inpatient Hospital Stay (HOSPITAL_COMMUNITY): Payer: Medicare HMO

## 2020-12-18 DIAGNOSIS — N170 Acute kidney failure with tubular necrosis: Secondary | ICD-10-CM

## 2020-12-18 DIAGNOSIS — G40901 Epilepsy, unspecified, not intractable, with status epilepticus: Secondary | ICD-10-CM | POA: Diagnosis not present

## 2020-12-18 DIAGNOSIS — J9601 Acute respiratory failure with hypoxia: Secondary | ICD-10-CM | POA: Diagnosis not present

## 2020-12-18 LAB — BASIC METABOLIC PANEL
Anion gap: 5 (ref 5–15)
BUN: 15 mg/dL (ref 6–20)
CO2: 22 mmol/L (ref 22–32)
Calcium: 8 mg/dL — ABNORMAL LOW (ref 8.9–10.3)
Chloride: 115 mmol/L — ABNORMAL HIGH (ref 98–111)
Creatinine, Ser: 1.6 mg/dL — ABNORMAL HIGH (ref 0.61–1.24)
GFR, Estimated: 53 mL/min — ABNORMAL LOW (ref >60)
Glucose, Bld: 106 mg/dL — ABNORMAL HIGH (ref 70–99)
Potassium: 4.2 mmol/L (ref 3.5–5.1)
Sodium: 142 mmol/L (ref 135–145)

## 2020-12-18 LAB — GLUCOSE, CAPILLARY
Glucose-Capillary: 111 mg/dL — ABNORMAL HIGH (ref 70–99)
Glucose-Capillary: 116 mg/dL — ABNORMAL HIGH (ref 70–99)
Glucose-Capillary: 130 mg/dL — ABNORMAL HIGH (ref 70–99)
Glucose-Capillary: 150 mg/dL — ABNORMAL HIGH (ref 70–99)
Glucose-Capillary: 149 mg/dL — ABNORMAL HIGH (ref 70–99)
Glucose-Capillary: 145 mg/dL — ABNORMAL HIGH (ref 70–99)

## 2020-12-18 LAB — URINALYSIS, ROUTINE W REFLEX MICROSCOPIC
Bilirubin Urine: NEGATIVE
Glucose, UA: NEGATIVE mg/dL
Hgb urine dipstick: NEGATIVE
Ketones, ur: NEGATIVE mg/dL
Leukocytes,Ua: NEGATIVE
Nitrite: NEGATIVE
Protein, ur: NEGATIVE mg/dL
Specific Gravity, Urine: 1.014 (ref 1.005–1.030)
pH: 5 (ref 5.0–8.0)

## 2020-12-18 LAB — MAGNESIUM
Magnesium: 2.1 mg/dL (ref 1.7–2.4)
Magnesium: 1.7 mg/dL (ref 1.7–2.4)

## 2020-12-18 LAB — CK: Total CK: 561 U/L — ABNORMAL HIGH (ref 49–397)

## 2020-12-18 LAB — PHOSPHORUS
Phosphorus: 1.8 mg/dL — ABNORMAL LOW (ref 2.5–4.6)
Phosphorus: 4.3 mg/dL (ref 2.5–4.6)

## 2020-12-18 LAB — ALBUMIN: Albumin: 2.7 g/dL — ABNORMAL LOW (ref 3.5–5.0)

## 2020-12-18 LAB — PHENYTOIN LEVEL, TOTAL: Phenytoin Lvl: 14.8 ug/mL (ref 10.0–20.0)

## 2020-12-18 IMAGING — DX DG CHEST 1V PORT
1 series · 2 of 2 positions shown · non-contrast
Comparison: [DATE]

CLINICAL DATA: Respiratory failure

EXAM:
PORTABLE CHEST 1 VIEW

[Series 1: chest · 0.14mm/px · 2 of 2 slices shown]
[im 1/2]
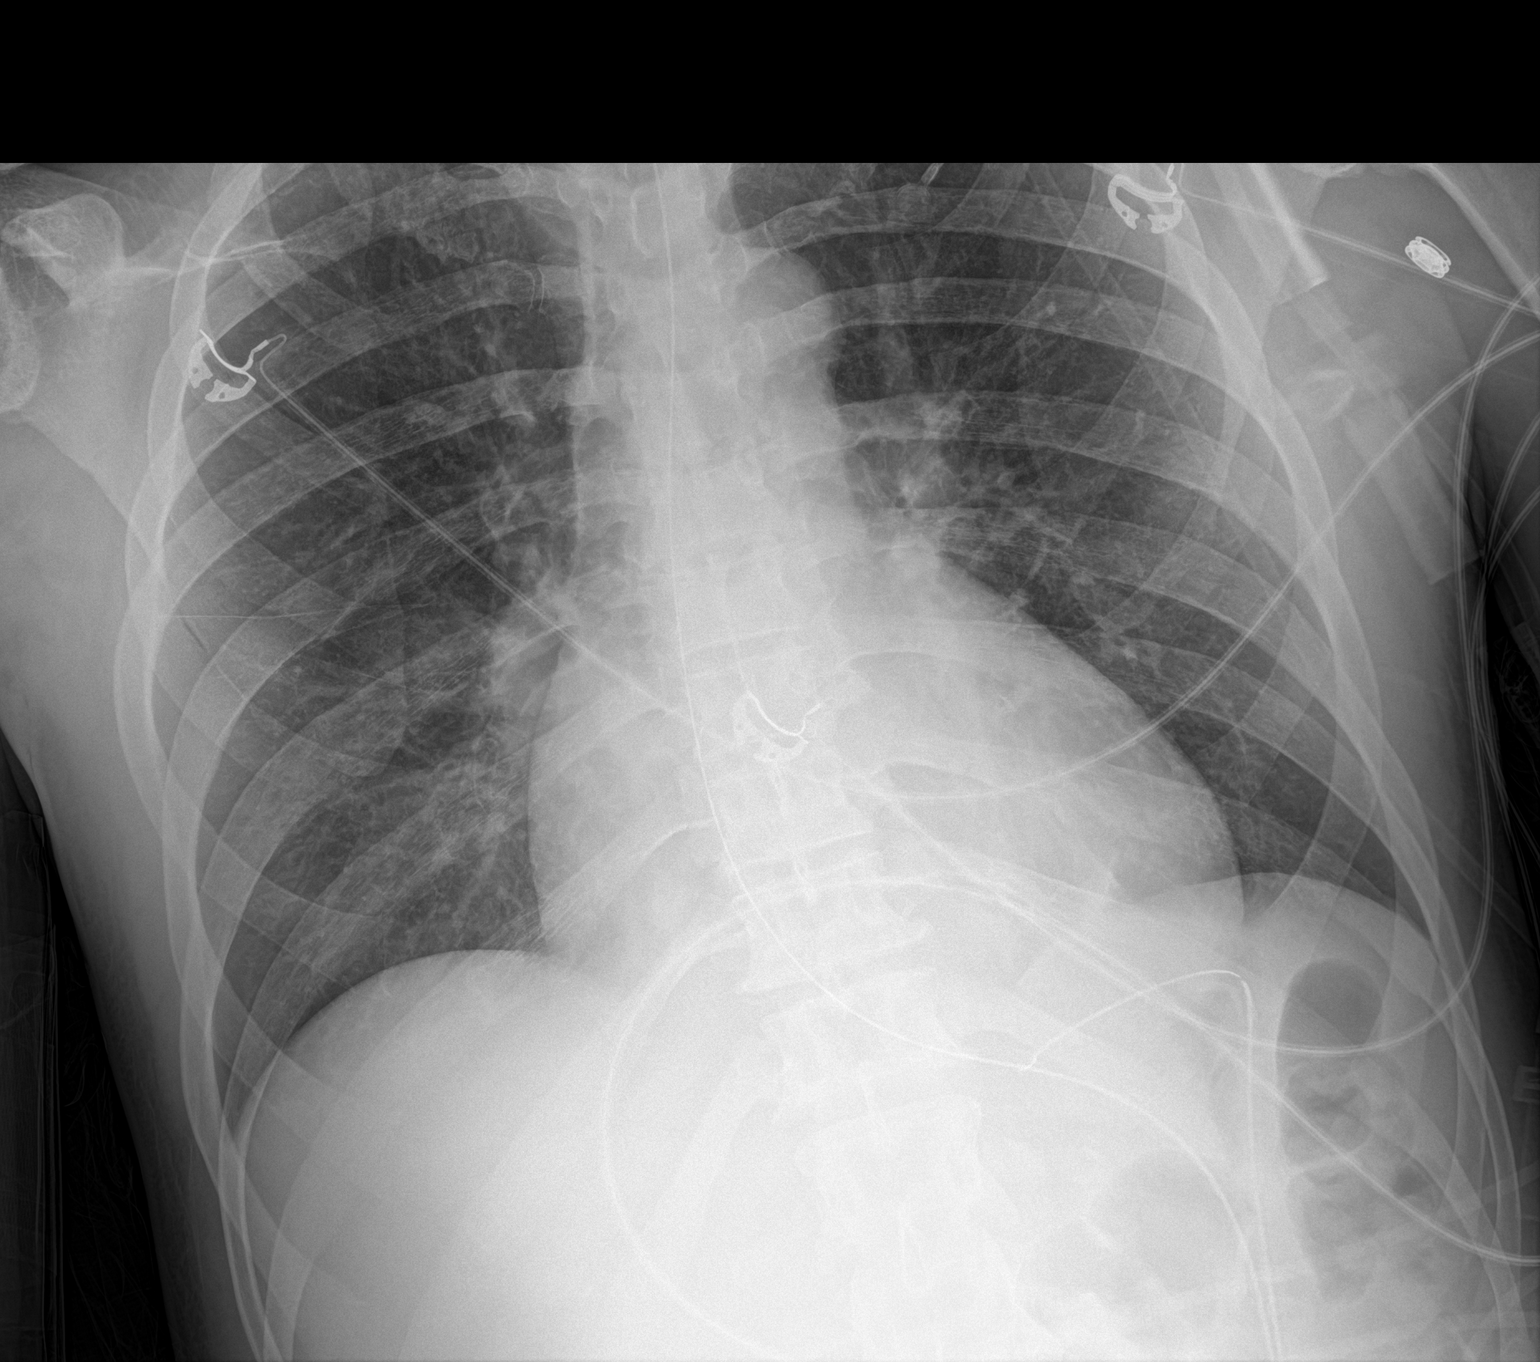
[im 2/2]
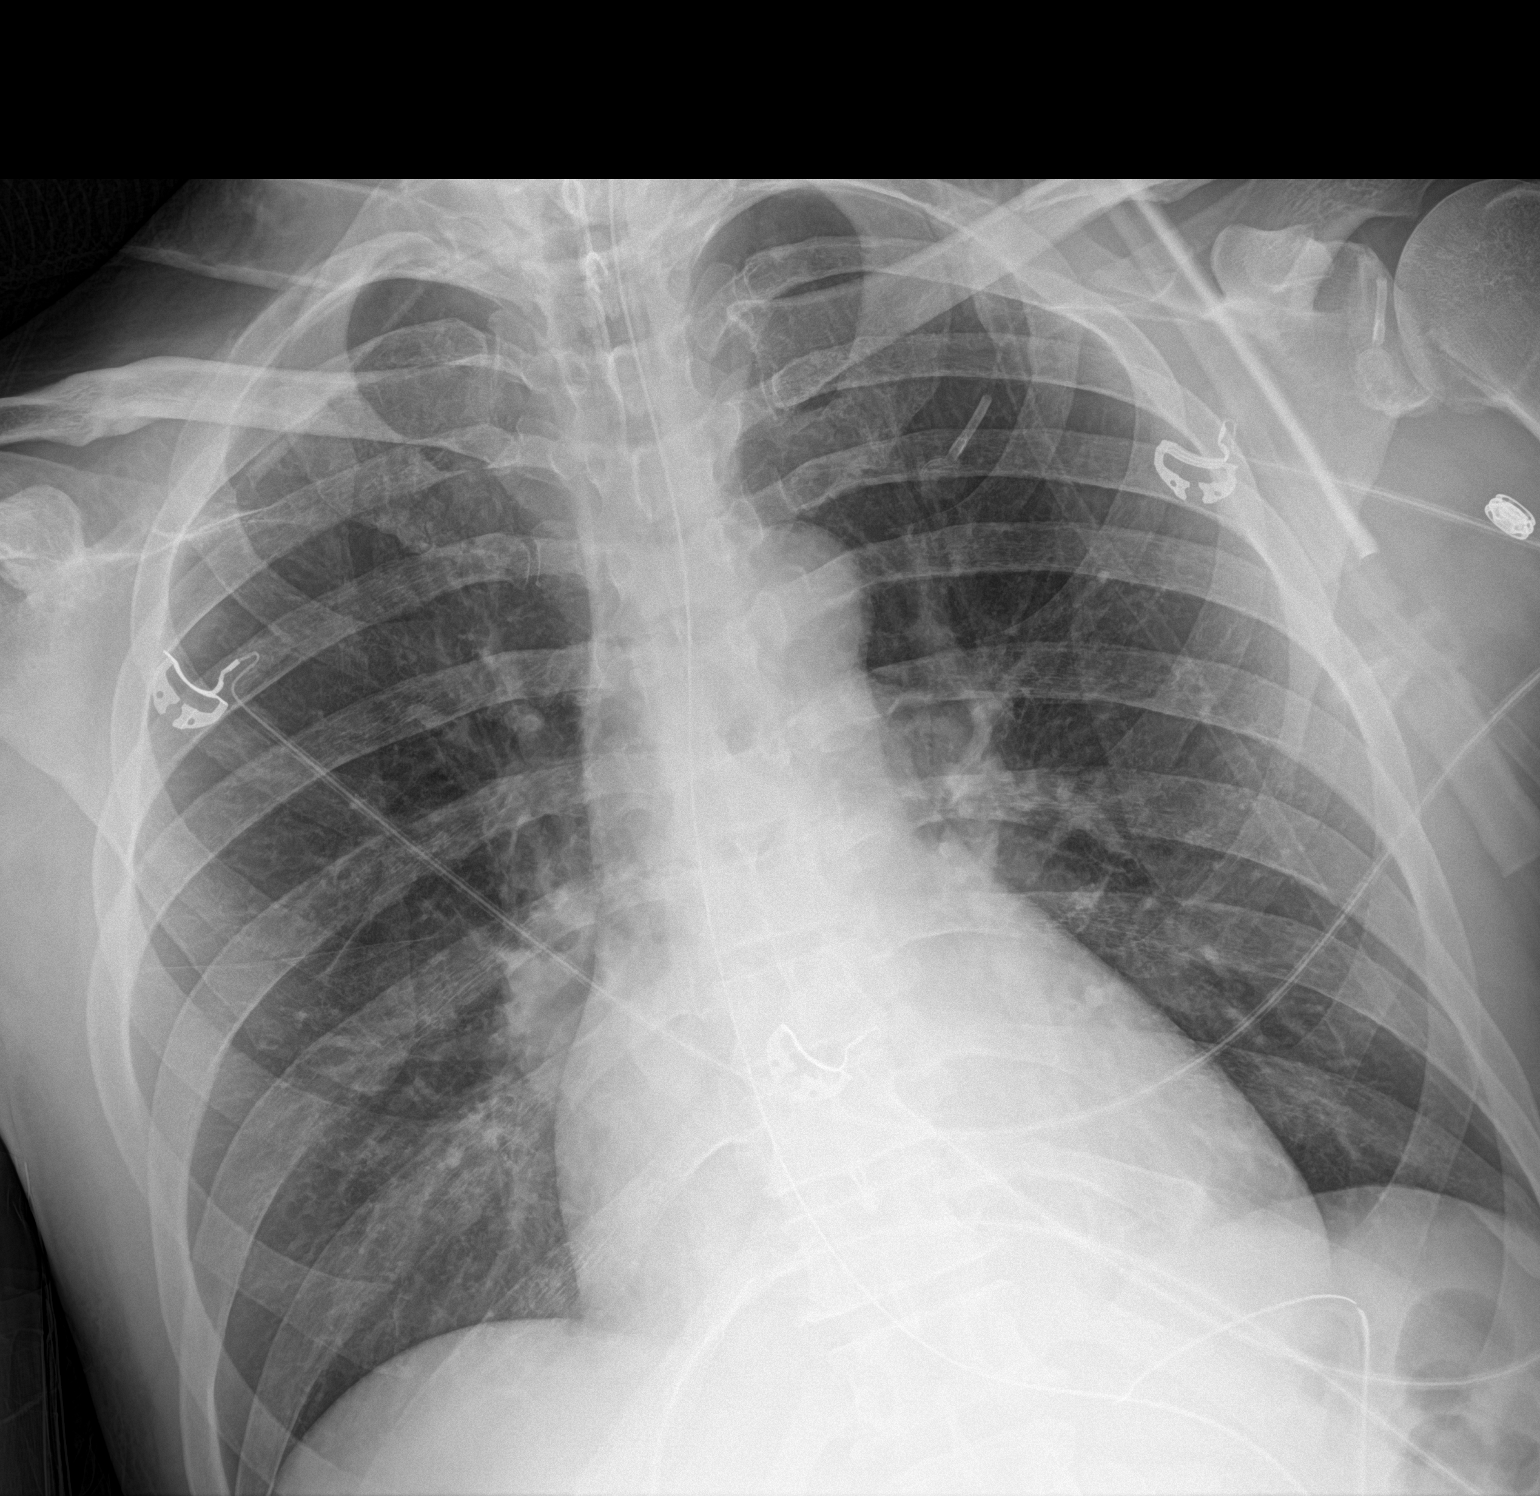

[2 of 2 positions shown; findings below may reference images not displayed]

FINDINGS: Endotracheal tube terminates 5 cm above the carina.

Enteric tube terminates in the proximal stomach.

Lungs are clear.  No pleural effusion or pneumothorax.

Heart is normal in size.
IMPRESSION: Endotracheal tube terminates 5 cm above the carina.

No evidence of acute cardiopulmonary disease.

## 2020-12-18 MED ORDER — SODIUM PHOSPHATES 45 MMOLE/15ML IV SOLN
30.0000 mmol | Freq: Once | INTRAVENOUS | Status: AC
  Administered 2020-12-18: 30 mmol via INTRAVENOUS
  Filled 2020-12-18: qty 10

## 2020-12-18 MED ORDER — SODIUM CHLORIDE 0.9 % IV SOLN
100.0000 mg | Freq: Two times a day (BID) | INTRAVENOUS | Status: DC
  Administered 2020-12-18 – 2020-12-19 (×3): 100 mg via INTRAVENOUS
  Filled 2020-12-18 (×5): qty 10

## 2020-12-18 MED ORDER — DOCUSATE SODIUM 50 MG/5ML PO LIQD
100.0000 mg | Freq: Two times a day (BID) | ORAL | Status: DC
  Administered 2020-12-18 – 2021-01-04 (×26): 100 mg
  Filled 2020-12-18 (×28): qty 10

## 2020-12-18 MED ORDER — ROCURONIUM BROMIDE 50 MG/5ML IV SOLN
50.0000 mg | Freq: Once | INTRAVENOUS | Status: AC
  Filled 2020-12-18: qty 5

## 2020-12-18 MED ORDER — SODIUM CHLORIDE 0.9 % IV SOLN
1690.0000 mg | Freq: Every day | INTRAVENOUS | Status: AC
  Administered 2020-12-18: 1690 mg via INTRAVENOUS
  Filled 2020-12-18: qty 13

## 2020-12-18 MED ORDER — DEXTROSE IN LACTATED RINGERS 5 % IV SOLN: INTRAVENOUS | Status: DC

## 2020-12-18 MED ORDER — LEVETIRACETAM IN NACL 1000 MG/100ML IV SOLN
1000.0000 mg | Freq: Two times a day (BID) | INTRAVENOUS | Status: DC
  Administered 2020-12-18 – 2020-12-19 (×3): 1000 mg via INTRAVENOUS
  Filled 2020-12-18 (×3): qty 100

## 2020-12-18 MED ORDER — LEVETIRACETAM IN NACL 1000 MG/100ML IV SOLN
1000.0000 mg | Freq: Once | INTRAVENOUS | Status: AC
  Administered 2020-12-18: 1000 mg via INTRAVENOUS
  Filled 2020-12-18: qty 100

## 2020-12-18 MED ORDER — PROSOURCE TF PO LIQD
90.0000 mL | Freq: Four times a day (QID) | ORAL | Status: DC
  Administered 2020-12-18 – 2020-12-19 (×6): 90 mL
  Filled 2020-12-18 (×6): qty 90

## 2020-12-18 MED ORDER — ROCURONIUM BROMIDE 10 MG/ML (PF) SYRINGE
PREFILLED_SYRINGE | INTRAVENOUS | Status: AC
  Administered 2020-12-18: 50 mg via INTRAVENOUS
  Filled 2020-12-18: qty 10

## 2020-12-18 MED ORDER — SODIUM CHLORIDE 0.9 % IV SOLN
200.0000 mg | Freq: Once | INTRAVENOUS | Status: AC
  Administered 2020-12-18: 200 mg via INTRAVENOUS
  Filled 2020-12-18: qty 20

## 2020-12-18 MED ORDER — INSULIN ASPART 100 UNIT/ML IJ SOLN
0.0000 [IU] | INTRAMUSCULAR | Status: DC
  Administered 2020-12-18 – 2020-12-20 (×8): 1 [IU] via SUBCUTANEOUS
  Administered 2020-12-20: 2 [IU] via SUBCUTANEOUS
  Administered 2020-12-20 – 2020-12-22 (×10): 1 [IU] via SUBCUTANEOUS
  Administered 2020-12-23: 3 [IU] via SUBCUTANEOUS
  Administered 2020-12-23 – 2020-12-24 (×3): 1 [IU] via SUBCUTANEOUS

## 2020-12-18 MED ORDER — MIDAZOLAM BOLUS VIA INFUSION
10.0000 mg | INTRAVENOUS | Status: AC
Start: 2020-12-18 — End: 2020-12-18
  Administered 2020-12-18 (×3): 10 mg via INTRAVENOUS
  Filled 2020-12-18 (×3): qty 10

## 2020-12-18 MED ORDER — PROCHLORPERAZINE EDISYLATE 10 MG/2ML IJ SOLN
10.0000 mg | Freq: Four times a day (QID) | INTRAMUSCULAR | Status: DC | PRN
  Filled 2020-12-18: qty 2

## 2020-12-18 MED ORDER — PHENOBARBITAL SODIUM 65 MG/ML IJ SOLN: 20.0000 mg/kg | Freq: Every day | INTRAMUSCULAR | Status: DC

## 2020-12-18 MED ORDER — MIDAZOLAM BOLUS VIA INFUSION
0.2000 mg/kg | INTRAVENOUS | Status: DC | PRN
Start: 2020-12-18 — End: 2020-12-20
  Administered 2020-12-18 – 2020-12-19 (×7): 16.7 mg via INTRAVENOUS
  Filled 2020-12-18: qty 17

## 2020-12-18 MED ORDER — VITAL HIGH PROTEIN PO LIQD
1000.0000 mL | ORAL | Status: DC
  Administered 2020-12-18 – 2020-12-19 (×2): 1000 mL

## 2020-12-18 NOTE — Progress Notes (Signed)
eLink Physician-Brief Progress Note Patient Name: Luis Hunter DOB: June 05, 1975 MRN: [id number]   Date of Service  12/18/2020  HPI/Events of Note  Notified of vomiting when patient was being turned for nursing care. Pt was on tube feeds at 40cc/hr.   Feeding tube to wall suction, removing about 200cc of tube feeds.  Pt's glucose has been borderline in the 70s prior to starting tube feeds.   Qtc 457  eICU Interventions  Get CXR.  Hold tube feeds.  Start on D5LR.  Compazine prn.     Intervention Category Intermediate Interventions: Other:  Elsie Lincoln 12/18/2020, 2:55 AM

## 2020-12-18 NOTE — Progress Notes (Addendum)
Neurology Progress Note  Patient ID: Luis Hunter is a 45 y.o. with PMHx of  has a past medical history of Bipolar disorder ([place]), Depression, Schizophrenia ([place]), and Seizures ([place]).  He initially presented to [facility] after several witnessed seizures at home.  Has a history of substance abuse and medication nonadherence, UDS notably positive for THC and cocaine in addition to the benzodiazepines he was administered for seizure control.  He was transferred to [facility] for long-term EEG monitoring and continues on this.  Seizures were well controlled on propofol (80 mg/kg/min and Versed 10 mg/h)   Major interval events:  Versed was slowly weaned down to 2 mg/h, propofol at 60 mcg/kg/min  Called by nursing, patient had had an episode of emesis, and after bathing was noted to be having myoclonic jerking in his full body.  EEG reviewed and concerning for seizure activity.  Responded to 0.2 mg/kg midazolam bolus, drip was continued at 4.  Subsequently tolerated x-rays without recurrent myoclonic jerking and EEG showed diffuse slowing.  This was followed by recurrent myoclonic activity that was not stimulus provoked, which broke with 0.2 mg/kg midazolam again.    Subjective: Unresponsive  Exam: Vitals:   12/18/20 0100 12/18/20 0401  BP: 105/68   Pulse: 76 (!) 103  Resp: 18   Temp:  99.1 F (37.3 C)  SpO2: 98% 96%   Gen: In bed, does not appear to be in distress Resp: Intubated and appears comfortable on the ventilator, non-labored breathing, no grossly audible wheezing Cardiac: Perfusing extremities well  Abd: soft, nt  Neuro: MS: Obtunded.  No eye-opening to noxious stimulation or voice CN: Pupils equal round reactive to light, no blink to threat, no gaze deviation, Sensory/motor: No response to noxious stimulation in all 4 extremities   Pertinent data:  Basic Metabolic Panel: Recent Labs  Lab 12/16/20 1118 12/16/20 1846 12/17/20 0503  NA 140 140 139  K 3.4* 2.9* 3.5  CL 106   --  109  CO2 24  --  21*  GLUCOSE 102*  --  105*  BUN 11  --  11  CREATININE 0.91  --  1.59*  CALCIUM 8.2*  --  8.1*  MG 2.1  --  2.0  PHOS  --   --  3.1    CBC: Recent Labs  Lab 12/16/20 1118 12/16/20 1846 12/17/20 0503  WBC 12.1*  --  13.8*  NEUTROABS 9.6*  --   --   HGB 13.7 11.6* 12.2*  HCT 41.0 34.0* 35.8*  MCV 90.5  --  88.8  PLT 214  --  215    Coagulation Studies: No results for input(s): LABPROT, INR in the last 72 hours.    Estimated Creatinine Clearance: 61.8 mL/min (A) (by C-G formula based on SCr of 1.59 mg/dL (H)).   Post load phenytoin level 15 on 8/14  Impression: 45 year old with status epilepticus-has a history of bipolar disorder, depression, schizophrenia polysubstance abuse and seizures with noncompliance to medications, admitted on 8/13 to [facility]. Emergently intubated and started on propofol drip after a load of Keppra was administered. Due to equivocal shaking of whole body-concern for ongoing seizure activity for which Versed was added-received 10 mg IV followed by 10 mg/h drip.  Weaning of sedation was started on 8/14 Clinical concern for myoclonic seizures overnight on my brief review of EEG  Plan:  - Vimpat 200 mg ordered as a loading dose followed by 100 mg twice daily. - Increase Keppra to 1000 mg twice daily (based  on CrCl above), with an additional 1000 mg this morning for recurrent myoclonic activity - Continue phenytoin 100 mg Q8hr, level this morning to confirm appropriate level on this maintenance dose - Neurology will follow up epileptologist's long-term monitoring EEG read, continue LTM at this time - Appreciate management of comorbidities including ventilator management and nutrition management per CCM team - Neurology will continue to follow  [place] [phone] Available 7 PM to 7 AM, outside of these hours please call Neurologist on call as listed on Amion.   CRITICAL CARE Performed by:  Lorenza Chick   Total critical care time: 35 minutes  Critical care time was exclusive of separately billable procedures and treating other patients.  Critical care was necessary to treat or prevent imminent or life-threatening deterioration.  Critical care was time spent personally by me on the following activities: development of treatment plan with patient and/or surrogate as well as nursing, discussions with consultants, evaluation of patient's response to treatment, examination of patient, obtaining history from patient or surrogate, ordering and performing treatments and interventions, ordering and review of laboratory studies, ordering and review of radiographic studies, as well as EEG, pulse oximetry and re-evaluation of patient's condition.

## 2020-12-18 NOTE — Progress Notes (Signed)
LTM maint complete - no skin breakdown under:  Fp1 F3 F7 F8  Maintenance C3 P8 Pz F8 [facility] monitored, Event button test confirmed by [facility].

## 2020-12-18 NOTE — Progress Notes (Signed)
SZ activity noted after stimulating patient, fine tremors to chest and head, bl arms with decerebrate-like movements, RR 30's, HR 120's. Activated EEG button and bolused with Versed. Will cortext neurology.

## 2020-12-18 NOTE — Progress Notes (Signed)
Patient has continued movements concerning for subtle convulsive status epilepticus. His EEG shows a discontinuous pattern that is not obviously an epileptiform EEG, but given the clinical correlate, I would favor treating at this time as the EEG can be less sharply contoured and have an unusual appearance in prolonged status epilepticus.   I gave some rocuronium while the movements were happening to attempt to better assess without muscle artifact.   Increase propofol to 80 mcg/kg/min from 60, load with phenobarbital '20mg'$ /kg then re-assess.   This patient is critically ill and at significant risk of neurological worsening, death and care requires constant monitoring of vital signs, hemodynamics,respiratory and cardiac monitoring, neurological assessment, discussion with family, other specialists and medical decision making of high complexity. I spent 45 minutes of neurocritical care time  in the care of  this patient. This was time spent independent of any time provided by nurse practitioner or PA.  Roland Rack, MD [facility] [phone]  If 7pm- 7am, please page neurology on call as listed in [place]. 12/18/2020  7:57 AM

## 2020-12-18 NOTE — Procedures (Addendum)
Patient Name: Luis Hunter  MRN: [id number]  Epilepsy Attending: Lora Havens  Referring Physician/Provider: Dr Amie [place] Duration: 12/17/2020 1803 to 12/18/2020 1803  Patient history: 45 year old with status epilepticus-has a history of bipolar disorder, depression, schizophrenia polysubstance abuse and seizures with noncompliance to medications. EEG to evaluate for seizure  Level of alertness: comatose  AEDs during EEG study: LEV, PHT, phenobarb, propofol, Versed  Technical aspects: This EEG study was done with scalp electrodes positioned according to the 10-20 International system of electrode placement. Electrical activity was acquired at a sampling rate of '500Hz'$  and reviewed with a high frequency filter of '70Hz'$  and a low frequency filter of '1Hz'$ . EEG data were recorded continuously and digitally stored.   Description:  EEG initially showed burst suppression pattern with burst of generalized 2-'3hz'$  delta slowing and overriding 15-'18Hz'$  beta activity lasting 2-3 seconds and eeg suppression lasting 8-12 seconds. Gradually, EEG became near continuous and showed 3 to 5 Hz sharply contoured theta-delta slowing in right hemisphere which at times appeared rhythmic with triphasic morphology, no definite evolution was seen.  There was also near continuous 3 to 5 Hz theta - delta slowing in left hemisphere.  Brief 2 to 3 seconds of generalized EEG attenuation were also noted.  As sedation was adjusted, EEG again showed burst suppression pattern with bursts of generalized sharply contoured 3 to 5 Hz theta-delta slowing admixed with 15 to 18 Hz beta activity lasting 2 to 3 seconds alternating with 5 to 8 seconds of generalized EEG suppression.  Of note, patient was noted to have multiple episodes of left > right upper extremity >lower extremity rhythmic twitching.  Concomitant EEG before, during and after the event showed sharply contoured 3 to 5 Hz theta-delta slowing in right hemisphere which at times  appeared rhythmic with triphasic morphology, without definite evolution.  IMPRESSION: This study is suggestive of cortical dysfunction in left hemisphere likely secondary to underlying structural abnormality, postictal state.  EEG also showed lateralized rhythmic delta activity which at times appeared sharply contoured without definite evolution and right hemisphere which is suggestive of cortical dysfunction in right hemisphere.  This EEG pattern is on the ictal-interictal continuum with low to intermediate potential for seizure recurrence.  Additionally there is severe to profound diffuse encephalopathy, nonspecific etiology but likely related to sedation, seizure.  Patient was noted to have multiple episodes of left > right upper extremity >lower extremity rhythmic twitching.  Concomitant EEG showed sharply contoured 3 to 5 Hz theta-delta slowing which at times appeared rhythmic with triphasic morphology, without definite evolution.  The semiology of these episodes is concerning for focal motor seizures which may not be seen on scalp EEG.  Clinical correlation is recommended.  Willean Schurman Barbra Sarks

## 2020-12-18 NOTE — Progress Notes (Signed)
EEG maint complete.  ?

## 2020-12-18 NOTE — Progress Notes (Signed)
NAME:  Luis Hunter, MRN:  [id number], DOB:  [birth year], LOS: 2 ADMISSION DATE:  12/16/2020, CONSULTATION DATE:  12/18/2020  REFERRING MD:  Starleen Blue, EDP [facility], CHIEF COMPLAINT:  seizures, intubated   History of Present Illness:  History is obtained from chart review since patient is currently intubated and sedated.  45 year old man with prior history of seizures and substance abuse found on the floor unresponsive by his wife.  Multiple witnessed seizures by wife and EMS, received 8 mg of IV Versed in the field and continued to seize on arrival to the ED, received 4 mg of IV Ativan, intubated for airway protection and started on propofol drip.  Loaded with Keppra.  Versed drip was added since he continued to have twitching on maximum dose of propofol.  Head CT was negative.  UDS positive for cocaine and THC  Transferred to [facility] for continuous EEG monitoring  Pertinent  Medical History  Schizophrenia Substance abuse, cocaine Seizure disorder Bipolar and depression  Significant Hospital Events: Including procedures, antibiotic start and stop dates in addition to other pertinent events   8/13 intubated, transferred to [facility]  Interim History / Subjective:   Episode of emesis overnight. Tube feeds held, patient started on D5LR.  This morning, patient reported to have continuous movements/posturing c/f subtle convulsive status epilepticus.  Patient febrile to 102.1 this am (8/15).  Objective   Blood pressure 119/67, pulse (!) 114, temperature (!) 102.1 F (38.9 C), temperature source Axillary, resp. rate (!) 23, height '5\' 11"'$  (1.803 m), weight 84.5 kg, SpO2 94 %.    Vent Mode: PRVC FiO2 (%):  [40 %] 40 % Set Rate:  [8 bmp-18 bmp] 18 bmp Vt Set:  [600 mL] 600 mL PEEP:  [5 cmH20] 5 cmH20 Plateau Pressure:  [9 cmH20-16 cmH20] 16 cmH20   Intake/Output Summary (Last 24 hours) at 12/18/2020 0931 Last data filed at 12/18/2020 0900 Gross per 24 hour  Intake 4435.2 ml  Output 1710 ml  Net  2725.2 ml   Filed Weights   12/16/20 1845 12/17/20 0240 12/18/20 0400  Weight: 81.3 kg 83.7 kg 84.5 kg    Examination: General: Patient heavily sedated lying in bed on mechanical ventilation. NAD. HENT: ETT to vent, Pinpoint pupils reactive to light. Lungs:  Respiratory effort normal on vent. Cardiovascular: s1 normal, s2 normal. Regular rate, normal rhythm. No murmur, rub, or gallop. Abdomen: Soft, nontender, non distended. Extremities: No edema or deformity. No rash. Neuro: sedated, not responsive, RASS -5.   BMP in progress. Glucose 116  Resolved Hospital Problem list   Hypotension Hypokalemia  Assessment & Plan:  Status epilepticus Neurology following. Patient with abnormal continuous movements this morning clinically thought to be convulsive status epilepticus. Propofol increased. Phenobarbital 20 mg/kg load given. EEG still in place. Will continue to follow. Appreciate neurology recs. - Management largely per neurology.  - Phenobarbital 20 mg/kg IV load - Keppra 1000 mg IV BID  - Dilantin 100 mg IV q8h EEG monitoring - Propofol/versed while on LTM  Aspiration Pneumonitis vs Infection Patient with fever to [id number] this am. Uncertain of etiology. Patient with emesis this am. Chest xray without abnormal findings. Question whether aspiration pneumonitis vs aspiration pneumonia. Will get respiratory cultures. Additionally, will get UA to assess for other source of fever. Will hold abx at this time, will treat if pending results of studies or patients clinical picture worsens. Also, worth considering hyperthermia related to seizure activity this am, however, would be unlikely onset of fever to occur at this  late after onset of event. - Respiratory culture - Urinalysis - Continue to monitor vitals  Acute respiratory failure Intubated for airway protection, clinical coma - maintain LTVV - not appropriate for SBT  Emesis Patient with episode of emesis overnight. Tube feeds  held, started on D5LR as patient with CBG in the 70s during admission. Will continue to hold feeds at this time. - Continue to hold feeds - Discontinue NS mIVF - Continue D5LR at 50 cc/hr - Compazine prn  AKI Patient with increase in Cr in setting of seizure activity. Patient currently receiving maintenance IVF. Will continue to monitor UOP. - Check CK - Monitor UOP - Avoid nephrotoxic agents  Hypophosphatemia 1.8 yesterday (8/14). Will replete and recheck tomorrow (8/16). - Sodium Phosphate 30 mmol - Recheck Phosphorous  Best Practice (right click and "Reselect all SmartList Selections" daily)   Diet/type: NPO. DVT prophylaxis: LMWH GI prophylaxis: PPI Lines: N/A Foley:  Yes, and it is still needed Code Status:  full code Last date of multidisciplinary goals of care discussion [significant other at bedside, updated]  Labs   CBC: Recent Labs  Lab 12/16/20 1118 12/16/20 1846 12/17/20 0503  WBC 12.1*  --  13.8*  NEUTROABS 9.6*  --   --   HGB 13.7 11.6* 12.2*  HCT 41.0 34.0* 35.8*  MCV 90.5  --  88.8  PLT 214  --  123456    Basic Metabolic Panel: Recent Labs  Lab 12/16/20 1118 12/16/20 1846 12/17/20 0503 12/18/20 0551  NA 140 140 139 142  K 3.4* 2.9* 3.5 4.2  CL 106  --  109 115*  CO2 24  --  21* 22  GLUCOSE 102*  --  105* 106*  BUN 11  --  11 15  CREATININE 0.91  --  1.59* 1.60*  CALCIUM 8.2*  --  8.1* 8.0*  MG 2.1  --  2.0 2.1  PHOS  --   --  3.1 1.8*   GFR: Estimated Creatinine Clearance: 61.4 mL/min (A) (by C-G formula based on SCr of 1.6 mg/dL (H)). Recent Labs  Lab 12/16/20 1118 12/16/20 1454 12/17/20 0503  WBC 12.1*  --  13.8*  LATICACIDVEN 6.0* 2.7*  --     Liver Function Tests: Recent Labs  Lab 12/16/20 1118 12/18/20 0551  AST 30  --   ALT 18  --   ALKPHOS 51  --   BILITOT 0.9  --   PROT 6.5  --   ALBUMIN 3.6 2.7*   No results for input(s): LIPASE, AMYLASE in the last 168 hours. No results for input(s): AMMONIA in the last 168  hours.  ABG    Component Value Date/Time   PHART 7.436 12/16/2020 1846   PCO2ART 33.4 12/16/2020 1846   PO2ART 80 (L) 12/16/2020 1846   HCO3 22.6 12/16/2020 1846   TCO2 24 12/16/2020 1846   ACIDBASEDEF 1.0 12/16/2020 1846   O2SAT 96.0 12/16/2020 1846     Coagulation Profile: No results for input(s): INR, PROTIME in the last 168 hours.  Cardiac Enzymes: No results for input(s): CKTOTAL, CKMB, CKMBINDEX, TROPONINI in the last 168 hours.  HbA1C: Hgb A1c MFr Bld  Date/Time Value Ref Range Status  11/20/2019 12:50 AM 5.8 (H) 4.8 - 5.6 % Final    Comment:    (NOTE) Pre diabetes:          5.7%-6.4%  Diabetes:              >6.4%  Glycemic control for   <7.0% adults  with diabetes     CBG: Recent Labs  Lab 12/17/20 1458 12/17/20 2008 12/17/20 2242 12/18/20 0416 12/18/20 [place] 68* 78 111* [place], [place] 12/18/2020 9:31 AM

## 2020-12-18 NOTE — Progress Notes (Signed)
Initial Nutrition Assessment  DOCUMENTATION CODES:   Not applicable  INTERVENTION:   Initiate Vital High Protein @ 20 ml/hr via OGT (480 ml daily)   90 ml Prostat 4 times daily.    30 ml free water flush every 4 hours  Tube feeding regimen provides 800 kcal (1851 kcals with propofol- 75% of needs), 130 grams of protein, and 401 ml of H2O.    Recommend advance 10 ml hr every 8 hours to goal rate of 60 ml/hr, which will provide 1440 kcals (2491 kcals with propofol at current rate), 126 grams protein, and 1204 kcals daily).   NUTRITION DIAGNOSIS:   Inadequate oral intake related to inability to eat as evidenced by NPO status.  GOAL:   Patient will meet greater than or equal to 90% of their needs  MONITOR:   Vent status, Labs, Weight trends, TF tolerance, Skin, I & O's  REASON FOR ASSESSMENT:   Consult, Ventilator Enteral/tube feeding initiation and management  ASSESSMENT:   45 year old man with prior history of seizures and substance abuse found on the floor unresponsive by his wife.  Multiple witnessed seizures by wife and EMS, received 8 mg of IV Versed in the field and continued to seize on arrival to the ED, received 4 mg of IV Ativan, intubated for airway protection and started on propofol drip.  Loaded with Keppra.  Versed drip was added since he continued to have twitching on maximum dose of propofol.  Head CT was negative.  UDS positive for cocaine and THC  Pt admitted with status ellipticus and acute respiratory failure.   8/13- CT of head did not reveal any acute intracranial findings  Patient is currently intubated on ventilator support. OGT currently clamped.  MV: 14.6 L/min Temp (24hrs), Avg:99.4 F (37.4 C), Min:98 F (36.7 C), Max:102.7 F (39.3 C)  Propofol: 39.8 ml/hr (provides 1051 kcals daily)  Reviewed I/O's: +1.6 L x 24 hours and +1.9 L since admission  UOP: 1.8 L x 24 hours  Noted that pt vomited yesterday morning after turning for nursing  care. TF currently on hold. Case discussed with MD (Dr. Tacy Learn) regarding possibility of resuming TF; RD received permission to start trickle feeds.   Reviewed wt hx; pt has experienced a 2% wt loss over the past 5 months, which is not significant for time frame.   Medications reviewed and include colace, dilantin, miralax, keppra, versed, levophed, and dextrose % in lactated ringers infusion @ 50 ml/hr. .   Lab Results  Component Value Date   HGBA1C 5.8 (H) 11/20/2019   PTA DM medications are none.   Labs reviewed: Phos: 1.8, CBGS: 78-116 (inpatient orders for glycemic control are ).    NUTRITION - FOCUSED PHYSICAL EXAM:  Flowsheet Row Most Recent Value  Orbital Region No depletion  Upper Arm Region No depletion  Thoracic and Lumbar Region No depletion  Buccal Region No depletion  Temple Region Mild depletion  Clavicle Bone Region Mild depletion  Clavicle and Acromion Bone Region No depletion  Scapular Bone Region No depletion  Dorsal Hand No depletion  Patellar Region No depletion  Anterior Thigh Region No depletion  Posterior Calf Region No depletion  Edema (RD Assessment) None  Hair Reviewed  Eyes Reviewed  Mouth Reviewed  Skin Reviewed  Nails Reviewed       Diet Order:   Diet Order             Diet NPO time specified  Diet effective now  EDUCATION NEEDS:   Not appropriate for education at this time  Skin:  Skin Assessment: Skin Integrity Issues: Skin Integrity Issues:: Other (Comment) Other: skin tear to lt buttocks  Last BM:  Unknown  Height:   Ht Readings from Last 1 Encounters:  12/16/20 '5\' 11"'$  (1.803 m)    Weight:   Wt Readings from Last 1 Encounters:  12/18/20 84.5 kg    Ideal Body Weight:  78.2 kg  BMI:  Body mass index is 25.98 kg/m.  Estimated Nutritional Needs:   Kcal:  2481  Protein:  110-125 grams  Fluid:  > 2 L    Loistine Chance, RD, LDN, [place] Registered Dietitian II Certified Diabetes Care and  Education Specialist Please refer to [facility] for RD and/or RD on-call/weekend/after hours pager

## 2020-12-19 DIAGNOSIS — N179 Acute kidney failure, unspecified: Secondary | ICD-10-CM

## 2020-12-19 DIAGNOSIS — G40901 Epilepsy, unspecified, not intractable, with status epilepticus: Secondary | ICD-10-CM | POA: Diagnosis not present

## 2020-12-19 DIAGNOSIS — J9601 Acute respiratory failure with hypoxia: Secondary | ICD-10-CM | POA: Diagnosis not present

## 2020-12-19 LAB — POCT I-STAT 7, (LYTES, BLD GAS, ICA,H+H)
Acid-base deficit: 5 mmol/L — ABNORMAL HIGH (ref 0.0–2.0)
Bicarbonate: 21.8 mmol/L (ref 20.0–28.0)
Calcium, Ion: 1.13 mmol/L — ABNORMAL LOW (ref 1.15–1.40)
HCT: 32 % — ABNORMAL LOW (ref 39.0–52.0)
Hemoglobin: 10.9 g/dL — ABNORMAL LOW (ref 13.0–17.0)
O2 Saturation: 86 %
Patient temperature: 98.7
Potassium: 3.6 mmol/L (ref 3.5–5.1)
Sodium: 143 mmol/L (ref 135–145)
TCO2: 23 mmol/L (ref 22–32)
pCO2 arterial: 44.7 mmHg (ref 32.0–48.0)
pH, Arterial: 7.296 — ABNORMAL LOW (ref 7.350–7.450)
pO2, Arterial: 57 mmHg — ABNORMAL LOW (ref 83.0–108.0)

## 2020-12-19 LAB — GLUCOSE, CAPILLARY
Glucose-Capillary: 127 mg/dL — ABNORMAL HIGH (ref 70–99)
Glucose-Capillary: 134 mg/dL — ABNORMAL HIGH (ref 70–99)
Glucose-Capillary: 135 mg/dL — ABNORMAL HIGH (ref 70–99)
Glucose-Capillary: 104 mg/dL — ABNORMAL HIGH (ref 70–99)
Glucose-Capillary: 110 mg/dL — ABNORMAL HIGH (ref 70–99)

## 2020-12-19 LAB — BASIC METABOLIC PANEL
Anion gap: 8 (ref 5–15)
BUN: 24 mg/dL — ABNORMAL HIGH (ref 6–20)
CO2: 20 mmol/L — ABNORMAL LOW (ref 22–32)
Calcium: 7.6 mg/dL — ABNORMAL LOW (ref 8.9–10.3)
Chloride: 114 mmol/L — ABNORMAL HIGH (ref 98–111)
Creatinine, Ser: 1.41 mg/dL — ABNORMAL HIGH (ref 0.61–1.24)
GFR, Estimated: >60 mL/min (ref >60)
Glucose, Bld: 136 mg/dL — ABNORMAL HIGH (ref 70–99)
Potassium: 4.3 mmol/L (ref 3.5–5.1)
Sodium: 142 mmol/L (ref 135–145)

## 2020-12-19 LAB — CBC
HCT: 35.1 % — ABNORMAL LOW (ref 39.0–52.0)
Hemoglobin: 11.4 g/dL — ABNORMAL LOW (ref 13.0–17.0)
MCH: 29.5 pg (ref 26.0–34.0)
MCHC: 32.5 g/dL (ref 30.0–36.0)
MCV: 90.9 fL (ref 80.0–100.0)
Platelets: 204 10*3/uL (ref 150–400)
RBC: 3.86 MIL/uL — ABNORMAL LOW (ref 4.22–5.81)
RDW: 16.3 % — ABNORMAL HIGH (ref 11.5–15.5)
WBC: 12.1 10*3/uL — ABNORMAL HIGH (ref 4.0–10.5)
nRBC: 0 % (ref 0.0–0.2)

## 2020-12-19 LAB — PHOSPHORUS: Phosphorus: 4.1 mg/dL (ref 2.5–4.6)

## 2020-12-19 LAB — PHENYTOIN LEVEL, TOTAL: Phenytoin Lvl: 11.8 ug/mL (ref 10.0–20.0)

## 2020-12-19 LAB — MAGNESIUM: Magnesium: 1.6 mg/dL — ABNORMAL LOW (ref 1.7–2.4)

## 2020-12-19 LAB — PHENOBARBITAL LEVEL: Phenobarbital: 20.6 ug/mL (ref 15.0–30.0)

## 2020-12-19 MED ORDER — VITAL HIGH PROTEIN PO LIQD
1000.0000 mL | ORAL | Status: AC
  Administered 2020-12-19 – 2020-12-20 (×2): 1000 mL

## 2020-12-19 MED ORDER — MAGNESIUM SULFATE 4 GM/100ML IV SOLN
4.0000 g | Freq: Once | INTRAVENOUS | Status: AC
  Administered 2020-12-19: 4 g via INTRAVENOUS
  Filled 2020-12-19: qty 100

## 2020-12-19 MED ORDER — BETHANECHOL CHLORIDE 10 MG PO TABS
10.0000 mg | ORAL_TABLET | Freq: Four times a day (QID) | ORAL | Status: DC
  Administered 2020-12-19 – 2020-12-24 (×23): 10 mg
  Filled 2020-12-19 (×23): qty 1

## 2020-12-19 MED ORDER — LEVETIRACETAM IN NACL 500 MG/100ML IV SOLN
500.0000 mg | Freq: Once | INTRAVENOUS | Status: AC
  Administered 2020-12-19: 500 mg via INTRAVENOUS
  Filled 2020-12-19: qty 100

## 2020-12-19 MED ORDER — SODIUM CHLORIDE 0.9 % IV SOLN
200.0000 mg | Freq: Two times a day (BID) | INTRAVENOUS | Status: DC
  Administered 2020-12-20 – 2020-12-23 (×8): 200 mg via INTRAVENOUS
  Filled 2020-12-19 (×10): qty 20

## 2020-12-19 MED ORDER — LACTATED RINGERS IV BOLUS
1000.0000 mL | Freq: Once | INTRAVENOUS | Status: AC
  Administered 2020-12-19: 1000 mL via INTRAVENOUS

## 2020-12-19 MED ORDER — PHENOBARBITAL SODIUM 65 MG/ML IJ SOLN
65.0000 mg | Freq: Two times a day (BID) | INTRAMUSCULAR | Status: DC
  Administered 2020-12-19 – 2020-12-27 (×17): 65 mg via INTRAVENOUS
  Filled 2020-12-19 (×17): qty 1

## 2020-12-19 MED ORDER — LEVETIRACETAM IN NACL 1500 MG/100ML IV SOLN
1500.0000 mg | Freq: Two times a day (BID) | INTRAVENOUS | Status: DC
  Administered 2020-12-19 – 2020-12-23 (×9): 1500 mg via INTRAVENOUS
  Filled 2020-12-19 (×11): qty 100

## 2020-12-19 MED ORDER — SODIUM CHLORIDE 0.9 % IV SOLN
3.0000 g | Freq: Four times a day (QID) | INTRAVENOUS | Status: DC
  Administered 2020-12-19 – 2020-12-20 (×5): 3 g via INTRAVENOUS
  Filled 2020-12-19 (×5): qty 8

## 2020-12-19 MED ORDER — PROSOURCE TF PO LIQD
45.0000 mL | Freq: Every day | ORAL | Status: DC
  Administered 2020-12-20 – 2021-01-10 (×21): 45 mL
  Filled 2020-12-19 (×22): qty 45

## 2020-12-19 MED ORDER — SODIUM CHLORIDE 0.9 % IV SOLN
100.0000 mg | Freq: Once | INTRAVENOUS | Status: AC
  Administered 2020-12-20: 100 mg via INTRAVENOUS
  Filled 2020-12-19: qty 10

## 2020-12-19 MED ORDER — VITAL HIGH PROTEIN PO LIQD: 1000.0000 mL | ORAL | Status: DC

## 2020-12-19 NOTE — Progress Notes (Signed)
EEG maint complete.  ?

## 2020-12-19 NOTE — Progress Notes (Addendum)
NAME:  Luis Hunter, MRN:  [id number], DOB:  06-11-1975, LOS: 3 ADMISSION DATE:  12/16/2020, CONSULTATION DATE:  12/19/2020  REFERRING MD:  Starleen Blue, EDP [facility], CHIEF COMPLAINT:  seizures, intubated   History of Present Illness:  History is obtained from chart review since patient is currently intubated and sedated.  45 year old man with prior history of seizures and substance abuse found on the floor unresponsive by his wife.  Multiple witnessed seizures by wife and EMS, received 8 mg of IV Versed in the field and continued to seize on arrival to the ED, received 4 mg of IV Ativan, intubated for airway protection and started on propofol drip.  Loaded with Keppra.  Versed drip was added since he continued to have twitching on maximum dose of propofol.  Head CT was negative.  UDS positive for cocaine and THC  Transferred to [facility] for continuous EEG monitoring  Pertinent  Medical History  Schizophrenia Substance abuse, cocaine Seizure disorder Bipolar and depression  Significant Hospital Events: Including procedures, antibiotic start and stop dates in addition to other pertinent events   8/13 intubated, transferred to [facility]  Interim History / Subjective:   Patient hypotensive, SBPs in 80s, not responsive to 1L bolus LR IVF. Levophed drip started. Additionally, patient reported to be hypothermic at 94.9. Patient placed under bair hugger for warming.  Objective   Blood pressure (!) 86/53, pulse 79, temperature (!) 94.9 F (34.9 C), temperature source Rectal, resp. rate 18, height '5\' 11"'$  (1.803 m), weight 81.5 kg, SpO2 97 %.    Vent Mode: PRVC FiO2 (%):  [40 %] 40 % Set Rate:  [18 bmp] 18 bmp Vt Set:  [550 mL] 550 mL PEEP:  [5 cmH20] 5 cmH20 Plateau Pressure:  [11 cmH20-17 cmH20] 15 cmH20   Intake/Output Summary (Last 24 hours) at 12/19/2020 1004 Last data filed at 12/19/2020 0900 Gross per 24 hour  Intake 3701.98 ml  Output 1725 ml  Net 1976.98 ml   Filed Weights   12/17/20 0240  12/18/20 0400 12/19/20 0400  Weight: 83.7 kg 84.5 kg 81.5 kg    Examination: General: Patient heavily sedated lying in bed under bair hugger on mechanical ventilation. NAD. HENT: ETT to vent, Pinpoint pupils reactive to light. Lungs:  Respiratory effort normal on vent. Cardiovascular: s1 normal, s2 normal. Regular rate, normal rhythm. No murmur, rub, or gallop. Abdomen: Soft, nontender, non distended. Extremities: No edema or deformity. No rash. Neuro: sedated, not responsive, RASS -5.    Resolved Hospital Problem list   Hypotension Hypokalemia Hypophosphatemia  Assessment & Plan:  Status epilepticus Neurology following. Per neurology note, long-term EEG patterns suggestive of left and right hemisphere cortical dysfunction c/w ictal-interictal continuum with low/intermediate potential for seizure recurrence. Patient still on dilantin, keppra, and vimpat for seizure activity. No reports of seizure like activity overnight. Long-term EEG still in place and recording. Patient on phenobarbital, versed, and propofol for sedation while on LTM. - Management largely per neurology, recommendations appreciated. - Keppra 1000 mg IV BID  - Dilantin 100 mg IV q8h - long-term EEG monitoring - Propofol, versed, phenobarbital while on LTM   Aspiration Pneumonia Hypotension c/f sepsis Patient with fever to 102.1 yesterday (8/15) that has since resolved/trending towards hypothermia. Per nursing staff, rectal temp 94.9 this am (8/16). Patient placed under warming blanket. Additionally, patient hypotensive with SBPs in mid to low 80s. Giving 1L LR this am (8/16). Will reassess BP. Patient with emesis yesterday (8/15) morning with unremarkable chest xray. Respiratory culture ordered with  question whether aspiration pneumonitis vs aspiration pneumonia. Gram stain positive for moderate gram positive organisms in clusters and chains; culture pending. Will start treating with unasyn at this time. Awaiting  results of culture and/or sensitivities. UA unremarkable. Bedside US this morning showed signs of right lung consolidation (hepatic lung). Heart function appeared grossly normal and volume status euvolemic. - start unasyn - 1L bolus LR IVF - levophed; titrate for MAP >65, SBP >90 - warming under bair hugger. - f/u respiratory culture - Continue to monitor vitals  Acute respiratory failure Intubated for airway protection, clinical coma. Patient heavily sedated 2/2 LTM for seizure activity, therefore, not appropriate candidate for SBT. Will continue to monitor and wean when clinically appropriate. - maintain LTVV - not appropriate for SBT  AKI Interval improvement in Cr. CK elevated at 516. This is likely due to seizure activity, not c/f for rhabdomyolysis as CK would expected to be at least 5x upper limit of normal (>1500). Also, UA without signs of myoglobin. Improving, will continue to monitor UOP. - Monitor UOP - Avoid nephrotoxic agents  Hypomagnesemia 1.6 today (8/16). Will replete and recheck tomorrow (8/17). - 4g Magnesium sulfate IV  Best Practice (right click and "Reselect all SmartList Selections" daily)   Diet/type: tubefeeds - trickle feeds. DVT prophylaxis: LMWH GI prophylaxis: PPI Lines: N/A Foley:  Yes, and it is still needed Code Status:  full code Last date of multidisciplinary goals of care discussion [significant other at bedside, updated]  Labs   CBC: Recent Labs  Lab 12/16/20 1118 12/16/20 1846 12/17/20 0503 12/19/20 0542  WBC 12.1*  --  13.8* 12.1*  NEUTROABS 9.6*  --   --   --   HGB 13.7 11.6* 12.2* 11.4*  HCT 41.0 34.0* 35.8* 35.1*  MCV 90.5  --  88.8 90.9  PLT 214  --  215 0000000    Basic Metabolic Panel: Recent Labs  Lab 12/16/20 1118 12/16/20 1846 12/17/20 0503 12/18/20 0551 12/18/20 1856 12/19/20 0542  NA 140 140 139 142  --  142  K 3.4* 2.9* 3.5 4.2  --  4.3  CL 106  --  109 115*  --  114*  CO2 24  --  21* 22  --  20*  GLUCOSE 102*   --  105* 106*  --  136*  BUN 11  --  11 15  --  24*  CREATININE 0.91  --  1.59* 1.60*  --  1.41*  CALCIUM 8.2*  --  8.1* 8.0*  --  7.6*  MG 2.1  --  2.0 2.1 1.7 1.6*  PHOS  --   --  3.1 1.8* 4.3 4.1   GFR: Estimated Creatinine Clearance: 69.7 mL/min (A) (by C-G formula based on SCr of 1.41 mg/dL (H)). Recent Labs  Lab 12/16/20 1118 12/16/20 1454 12/17/20 0503 12/19/20 0542  WBC 12.1*  --  13.8* 12.1*  LATICACIDVEN 6.0* 2.7*  --   --     Liver Function Tests: Recent Labs  Lab 12/16/20 1118 12/18/20 0551  AST 30  --   ALT 18  --   ALKPHOS 51  --   BILITOT 0.9  --   PROT 6.5  --   ALBUMIN 3.6 2.7*   No results for input(s): LIPASE, AMYLASE in the last 168 hours. No results for input(s): AMMONIA in the last 168 hours.  ABG    Component Value Date/Time   PHART 7.436 12/16/2020 1846   PCO2ART 33.4 12/16/2020 1846   PO2ART 80 (L) 12/16/2020  1846   HCO3 22.6 12/16/2020 1846   TCO2 24 12/16/2020 1846   ACIDBASEDEF 1.0 12/16/2020 1846   O2SAT 96.0 12/16/2020 1846     Coagulation Profile: No results for input(s): INR, PROTIME in the last 168 hours.  Cardiac Enzymes: Recent Labs  Lab 12/18/20 1320  CKTOTAL 561*    HbA1C: Hgb A1c MFr Bld  Date/Time Value Ref Range Status  11/20/2019 12:50 AM 5.8 (H) 4.8 - 5.6 % Final    Comment:    (NOTE) Pre diabetes:          5.7%-6.4%  Diabetes:              >6.4%  Glycemic control for   <7.0% adults with diabetes     CBG: Recent Labs  Lab 12/18/20 1530 12/18/20 1927 12/18/20 2322 12/19/20 0348 12/19/20 0726  GLUCAP 150* [place], [place] 12/19/2020 10:04 AM

## 2020-12-19 NOTE — Procedures (Addendum)
Patient Name: Luis Hunter  MRN: [id number]  Epilepsy Attending: Lora Havens  Referring Physician/Provider: Dr Amie [place] Duration: 12/18/2020 1803 to 12/19/2020 1803   Patient history: 45 year old with status epilepticus-has a history of bipolar disorder, depression, schizophrenia polysubstance abuse and seizures with noncompliance to medications. EEG to evaluate for seizure   Level of alertness: comatose   AEDs during EEG study: LEV, PHT, phenobarb, Vimpat, propofol, Versed   Technical aspects: This EEG study was done with scalp electrodes positioned according to the 10-20 International system of electrode placement. Electrical activity was acquired at a sampling rate of '500Hz'$  and reviewed with a high frequency filter of '70Hz'$  and a low frequency filter of '1Hz'$ . EEG data were recorded continuously and digitally stored.    Description:  EEG showed burst suppression pattern with bursts of generalized sharply contoured 3 to 5 Hz theta-delta slowing admixed with 15 to 18 Hz beta activity lasting 1-3 seconds alternating with 20-40 seconds of generalized EEG suppression.   IMPRESSION: This study is suggestive of profound diffuse encephalopathy, nonspecific etiology but likely related to sedation, seizure.  No seizures or definite epileptiform discharges were seen during this study.  Clotilde Loth Barbra Sarks

## 2020-12-19 NOTE — Progress Notes (Addendum)
Nutrition Follow-up  DOCUMENTATION CODES:   Not applicable  INTERVENTION:   Increase Vital High Protein @ 30 ml/hr via OGT and increase by 10 ml every 8 hours to goal rate of 50 ml/hr. (1200 ml daily)  45 ml Prosource daily.    30 ml free water flush every 4 hours  Tube feeding regimen provides 1240 kcal, 116 grams of protein, and 1003 ml of H2O. Total free water: 1183 ml free water daily  TF + propofol will provide 2271 kcals daily   NUTRITION DIAGNOSIS:   Inadequate oral intake related to inability to eat as evidenced by NPO status.  Ongoing  GOAL:   Patient will meet greater than or equal to 90% of their needs  Progressing   MONITOR:   Vent status, Labs, Weight trends, TF tolerance, Skin, I & O's  REASON FOR ASSESSMENT:   Consult, Ventilator Enteral/tube feeding initiation and management  ASSESSMENT:   45 year old man with prior history of seizures and substance abuse found on the floor unresponsive by his wife.  Multiple witnessed seizures by wife and EMS, received 8 mg of IV Versed in the field and continued to seize on arrival to the ED, received 4 mg of IV Ativan, intubated for airway protection and started on propofol drip.  Loaded with Keppra.  Versed drip was added since he continued to have twitching on maximum dose of propofol.  Head CT was negative.  UDS positive for cocaine and THC  8/13- CT of head did not reveal any acute intracranial findings  Patient is currently intubated on ventilator support MV: 10.5 L/min Temp (24hrs), Avg:97.3 F (36.3 C), Min:94.9 F (34.9 C), Max:98.5 F (36.9 C)  Propofol: 39.8 ml/hr (provides 1051 kcals daily)    Reviewed I/O's: +2.8 L x 24 hours and +4.7 L since admission  UOP: 1.5 L x 24 hours  Pt remains on continuous EEG monitoring.   Pt receiving Vital High Protein @ 30 ml/hr via OGT with 90 ml Prosource TF BID. Regimen provides 880 kcals and 105 grams protein. Pt tolerating feedings well. Case discussed with  MD (Dr. Tacy Learn); received permission to titrate feedings to goal rate.   Medications reviewed and include colace, dilantin, miralax, dextrose 5% in lactated ringers infusion @ 50 ml/hr, vimpat, keppra, and versed.   Labs reviewed: Mg: 1.6, CBGS: 127-145 (inpatient orders for glycemic control are 0-9 units insulin aspart every 4 hours).    Diet Order:   Diet Order             Diet NPO time specified  Diet effective now                   EDUCATION NEEDS:   Not appropriate for education at this time  Skin:  Skin Assessment: Skin Integrity Issues: Skin Integrity Issues:: Other (Comment) Other: skin tear to lt buttocks  Last BM:  Unknown  Height:   Ht Readings from Last 1 Encounters:  12/16/20 '5\' 11"'$  (1.803 m)    Weight:   Wt Readings from Last 1 Encounters:  12/19/20 81.5 kg    Ideal Body Weight:  78.2 kg  BMI:  Body mass index is 25.06 kg/m.  Estimated Nutritional Needs:   Kcal:  2481  Protein:  110-125 grams  Fluid:  > 2 L    Loistine Chance, RD, LDN, [place] Registered Dietitian II Certified Diabetes Care and Education Specialist Please refer to [facility] for RD and/or RD on-call/weekend/after hours pager

## 2020-12-19 NOTE — Progress Notes (Signed)
Subjective: No clinical seizures overnight.  ROS: Unable to obtain due to poor mental status  Examination  Vital signs in last 24 hours: Temp:  [94.9 F (34.9 C)-98.5 F (36.9 C)] 98.5 F (36.9 C) (08/16 1107) Pulse Rate:  [74-96] 96 (08/16 1230) Resp:  [14-22] 19 (08/16 1230) BP: (79-128)/(50-85) 103/58 (08/16 1230) SpO2:  [95 %-100 %] 95 % (08/16 1230) FiO2 (%):  [40 %] 40 % (08/16 1247) Weight:  [81.5 kg] 81.5 kg (08/16 0400)  General: lying in bed, NAD CVS: pulse-normal rate and rhythm RS: intubated, CTAB Extremities: normal, cool  Neuro: on propofol'@80mcg'$ /hr and versed '@10ml'$ /hr, comatose, does not open eyes to noxious stimuli, PERRLA, corneal reflex absent, gag reflex absent, does not withdraw to noxious stimuli in all 4 extremities  Basic Metabolic Panel: Recent Labs  Lab 12/16/20 1118 12/16/20 1846 12/17/20 0503 12/18/20 0551 12/18/20 1856 12/19/20 0542  NA 140 140 139 142  --  142  K 3.4* 2.9* 3.5 4.2  --  4.3  CL 106  --  109 115*  --  114*  CO2 24  --  21* 22  --  20*  GLUCOSE 102*  --  105* 106*  --  136*  BUN 11  --  11 15  --  24*  CREATININE 0.91  --  1.59* 1.60*  --  1.41*  CALCIUM 8.2*  --  8.1* 8.0*  --  7.6*  MG 2.1  --  2.0 2.1 1.7 1.6*  PHOS  --   --  3.1 1.8* 4.3 4.1    CBC: Recent Labs  Lab 12/16/20 1118 12/16/20 1846 12/17/20 0503 12/19/20 0542  WBC 12.1*  --  13.8* 12.1*  NEUTROABS 9.6*  --   --   --   HGB 13.7 11.6* 12.2* 11.4*  HCT 41.0 34.0* 35.8* 35.1*  MCV 90.5  --  88.8 90.9  PLT 214  --  215 204     Coagulation Studies: No results for input(s): LABPROT, INR in the last 72 hours.  Imaging CT head without contrast 12/16/2020: No acute abnormality.  Intubated patient with skull base artifact.  ASSESSMENT AND PLAN: 45 year old male with history of epilepsy who presented with status epilepticus in the setting of medication noncompliance.  Status epilepticus (resolved) Epilepsy with breakthrough seizure Acute  encephalopathy, postictal and medication induced Leukocytosis Microcytic anemia AKI Rabdomyolysis -LTM EEG is suggestive of profound diffuse encephalopathy without any definite seizures or epileptiform discharges.  Recommendations Continue current dose of Versed and propofol, plan to wean tomorrow. -We will increase Keppra to 1500 mg twice daily, continue Vimpat 100 mg twice daily, phenobarbital 65 mg twice daily and phenytoin 100 mg every 8 hours -Continue seizure precautions -Continue LTM EEG while on sedation -As needed IV Ativan 2 mg for clinical seizure-like activity -Management of rest of comorbidities per primary team -Called and updated patient's sister, RN at bedside as well as Dr. Tacy Learn  CRITICAL CARE Performed by: Lora Havens   Total critical care time: 49mnutes  Critical care time was exclusive of separately billable procedures and treating other patients.  Critical care was necessary to treat or prevent imminent or life-threatening deterioration.  Critical care was time spent personally by me on the following activities: development of treatment plan with patient and/or surrogate as well as nursing, discussions with consultants, evaluation of patient's response to treatment, examination of patient, obtaining history from patient or surrogate, ordering and performing treatments and interventions, ordering and review of laboratory studies, ordering and review  of radiographic studies, pulse oximetry and re-evaluation of patient's condition.  Zeb Comfort Epilepsy [facility] For questions after 5pm please refer to AMION to reach the Neurologist on call

## 2020-12-19 NOTE — Progress Notes (Signed)
Brief Neuro Update:  Notified by RN about potential seizure activity, was given Versed x 3 with continued movement. I do not see an EEG correlate but his seizures have been very subtle in the past. He appears mostly suppressed on EEG with some sharply contoured bursts.  Increased Versed to 30 and Vimpat to '200mg'$  Q12H.  [place] Pager Number [phone]

## 2020-12-19 NOTE — Progress Notes (Signed)
Patient appears to have seizure activity, breath stacking on vent. Bolus midaz given per order. EEG notified. Neurologist paged- Lorrin Goodell.Awaitng orders

## 2020-12-19 NOTE — Progress Notes (Signed)
LTM maint complete - no skin breakdown under: Fp2 F4 F8  Maintenance C4 Pz T8

## 2020-12-19 NOTE — Progress Notes (Addendum)
Pharmacy Antibiotic Note  Luis Hunter is a 45 y.o. male admitted on 12/16/2020 with multiple witnessed seizures and PMH significant for polysubstance use. Patient is currently heavily sedated with propofol and versed and loaded with phenobarbital 8/15.Patient is leukocytotic 8/16 and currently afebrile, but with Tmax 102.7 overnight. Tracheal aspirate 8/15 shows moderate GPCs in chains and clusters and few GNRs.  Pharmacy has been consulted for ampicillin/sulbactam dosing d/t concerns for aspiration pneumonia.    Plan: START Unasyn 3g IV Q6H  Monitor renal function F/U LOT    Height: '5\' 11"'$  (180.3 cm) Weight: 81.5 kg (179 lb 10.8 oz) IBW/kg (Calculated) : 75.3  Temp (24hrs), Avg:99.8 F (37.7 C), Min:97.5 F (36.4 C), Max:102.7 F (39.3 C)  Recent Labs  Lab 12/16/20 1118 12/16/20 1454 12/17/20 0503 12/18/20 0551  WBC 12.1*  --  13.8*  --   CREATININE 0.91  --  1.59* 1.60*  LATICACIDVEN 6.0* 2.7*  --   --     Estimated Creatinine Clearance: 61.4 mL/min (A) (by C-G formula based on SCr of 1.6 mg/dL (H)).    No Known Allergies  Antimicrobials this admission: Unasyn 8/16>>  Dose adjustments this admission: N/A   Microbiology results: 8/15 Sputum: Moderate GPCs in chains and clusters, Few GNRs   8/13 MRSA PCR: Not detected    Adria Dill, PharmD PGY-1 Acute Care Resident  12/19/2020 8:28 AM

## 2020-12-20 ENCOUNTER — Inpatient Hospital Stay (HOSPITAL_COMMUNITY): Payer: Medicare HMO

## 2020-12-20 DIAGNOSIS — J9601 Acute respiratory failure with hypoxia: Secondary | ICD-10-CM | POA: Diagnosis not present

## 2020-12-20 DIAGNOSIS — J9602 Acute respiratory failure with hypercapnia: Secondary | ICD-10-CM

## 2020-12-20 DIAGNOSIS — N179 Acute kidney failure, unspecified: Secondary | ICD-10-CM | POA: Diagnosis not present

## 2020-12-20 DIAGNOSIS — G40901 Epilepsy, unspecified, not intractable, with status epilepticus: Secondary | ICD-10-CM | POA: Diagnosis not present

## 2020-12-20 LAB — COMPREHENSIVE METABOLIC PANEL
ALT: 21 U/L (ref 0–44)
AST: 37 U/L (ref 15–41)
Albumin: 2 g/dL — ABNORMAL LOW (ref 3.5–5.0)
Alkaline Phosphatase: 46 U/L (ref 38–126)
Anion gap: 9 (ref 5–15)
BUN: 29 mg/dL — ABNORMAL HIGH (ref 6–20)
CO2: 22 mmol/L (ref 22–32)
Calcium: 7.6 mg/dL — ABNORMAL LOW (ref 8.9–10.3)
Chloride: 114 mmol/L — ABNORMAL HIGH (ref 98–111)
Creatinine, Ser: 1.56 mg/dL — ABNORMAL HIGH (ref 0.61–1.24)
GFR, Estimated: 55 mL/min — ABNORMAL LOW (ref >60)
Glucose, Bld: 123 mg/dL — ABNORMAL HIGH (ref 70–99)
Potassium: 3.7 mmol/L (ref 3.5–5.1)
Sodium: 145 mmol/L (ref 135–145)
Total Bilirubin: 0.9 mg/dL (ref 0.3–1.2)
Total Protein: 4.8 g/dL — ABNORMAL LOW (ref 6.5–8.1)

## 2020-12-20 LAB — TRIGLYCERIDES: Triglycerides: 177 mg/dL — ABNORMAL HIGH (ref <150)

## 2020-12-20 LAB — CBC
HCT: 34.5 % — ABNORMAL LOW (ref 39.0–52.0)
Hemoglobin: 11.1 g/dL — ABNORMAL LOW (ref 13.0–17.0)
MCH: 29.1 pg (ref 26.0–34.0)
MCHC: 32.2 g/dL (ref 30.0–36.0)
MCV: 90.6 fL (ref 80.0–100.0)
Platelets: 206 10*3/uL (ref 150–400)
RBC: 3.81 MIL/uL — ABNORMAL LOW (ref 4.22–5.81)
RDW: 16.2 % — ABNORMAL HIGH (ref 11.5–15.5)
WBC: 10.8 10*3/uL — ABNORMAL HIGH (ref 4.0–10.5)
nRBC: 0.2 % (ref 0.0–0.2)

## 2020-12-20 LAB — POCT I-STAT 7, (LYTES, BLD GAS, ICA,H+H)
Acid-base deficit: 4 mmol/L — ABNORMAL HIGH (ref 0.0–2.0)
Acid-base deficit: 5 mmol/L — ABNORMAL HIGH (ref 0.0–2.0)
Bicarbonate: 24 mmol/L (ref 20.0–28.0)
Bicarbonate: 22.7 mmol/L (ref 20.0–28.0)
Calcium, Ion: 1.12 mmol/L — ABNORMAL LOW (ref 1.15–1.40)
Calcium, Ion: 1.08 mmol/L — ABNORMAL LOW (ref 1.15–1.40)
HCT: 32 % — ABNORMAL LOW (ref 39.0–52.0)
HCT: 33 % — ABNORMAL LOW (ref 39.0–52.0)
Hemoglobin: 10.9 g/dL — ABNORMAL LOW (ref 13.0–17.0)
Hemoglobin: 11.2 g/dL — ABNORMAL LOW (ref 13.0–17.0)
O2 Saturation: 94 %
O2 Saturation: 90 %
Patient temperature: 99.4
Patient temperature: 99
Potassium: 3.6 mmol/L (ref 3.5–5.1)
Potassium: 3.8 mmol/L (ref 3.5–5.1)
Sodium: 143 mmol/L (ref 135–145)
Sodium: 142 mmol/L (ref 135–145)
TCO2: 26 mmol/L (ref 22–32)
TCO2: 24 mmol/L (ref 22–32)
pCO2 arterial: 58.6 mmHg — ABNORMAL HIGH (ref 32.0–48.0)
pCO2 arterial: 52.9 mmHg — ABNORMAL HIGH (ref 32.0–48.0)
pH, Arterial: 7.223 — ABNORMAL LOW (ref 7.350–7.450)
pH, Arterial: 7.242 — ABNORMAL LOW (ref 7.350–7.450)
pO2, Arterial: 88 mmHg (ref 83.0–108.0)
pO2, Arterial: 72 mmHg — ABNORMAL LOW (ref 83.0–108.0)

## 2020-12-20 LAB — MAGNESIUM: Magnesium: 1.9 mg/dL (ref 1.7–2.4)

## 2020-12-20 LAB — CULTURE, RESPIRATORY W GRAM STAIN: Culture: NORMAL

## 2020-12-20 LAB — PHENOBARBITAL LEVEL: Phenobarbital: 19.1 ug/mL (ref 15.0–30.0)

## 2020-12-20 LAB — PHENYTOIN LEVEL, TOTAL: Phenytoin Lvl: 11.2 ug/mL (ref 10.0–20.0)

## 2020-12-20 LAB — GLUCOSE, CAPILLARY
Glucose-Capillary: 130 mg/dL — ABNORMAL HIGH (ref 70–99)
Glucose-Capillary: 138 mg/dL — ABNORMAL HIGH (ref 70–99)
Glucose-Capillary: 105 mg/dL — ABNORMAL HIGH (ref 70–99)
Glucose-Capillary: 129 mg/dL — ABNORMAL HIGH (ref 70–99)
Glucose-Capillary: 95 mg/dL (ref 70–99)
Glucose-Capillary: 175 mg/dL — ABNORMAL HIGH (ref 70–99)
Glucose-Capillary: 127 mg/dL — ABNORMAL HIGH (ref 70–99)

## 2020-12-20 LAB — PHOSPHORUS: Phosphorus: 2.7 mg/dL (ref 2.5–4.6)

## 2020-12-20 IMAGING — DX DG CHEST 1V PORT
3 series · 3 of 3 positions shown · non-contrast
Comparison: Radiograph [DATE]

CLINICAL DATA: Central line placement

EXAM:
PORTABLE CHEST 1 VIEW

[chest ap (1 of 3)]
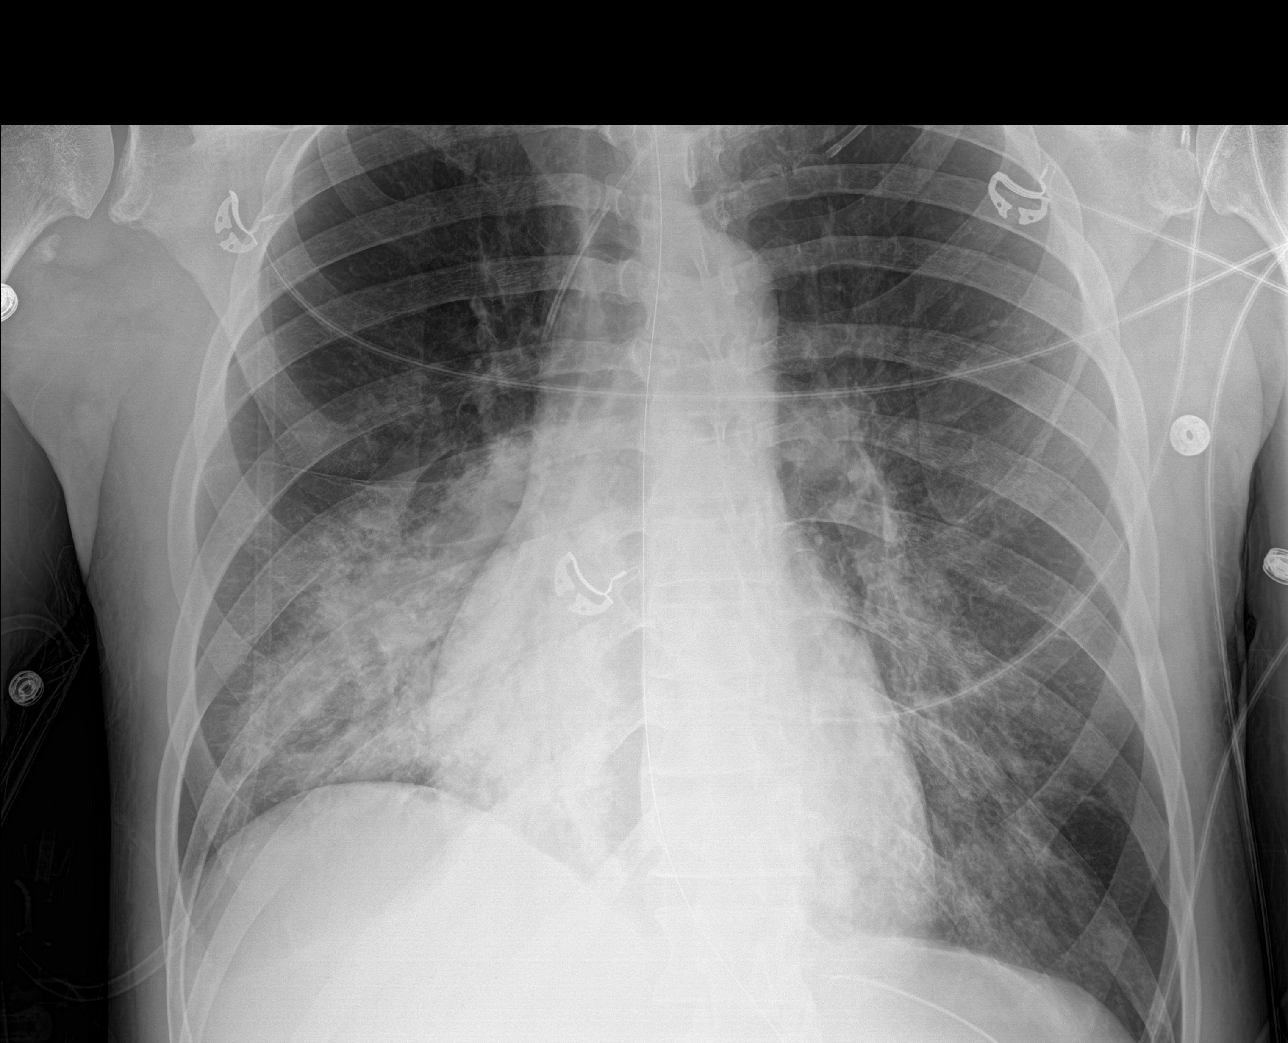

[chest ap (2 of 3)]
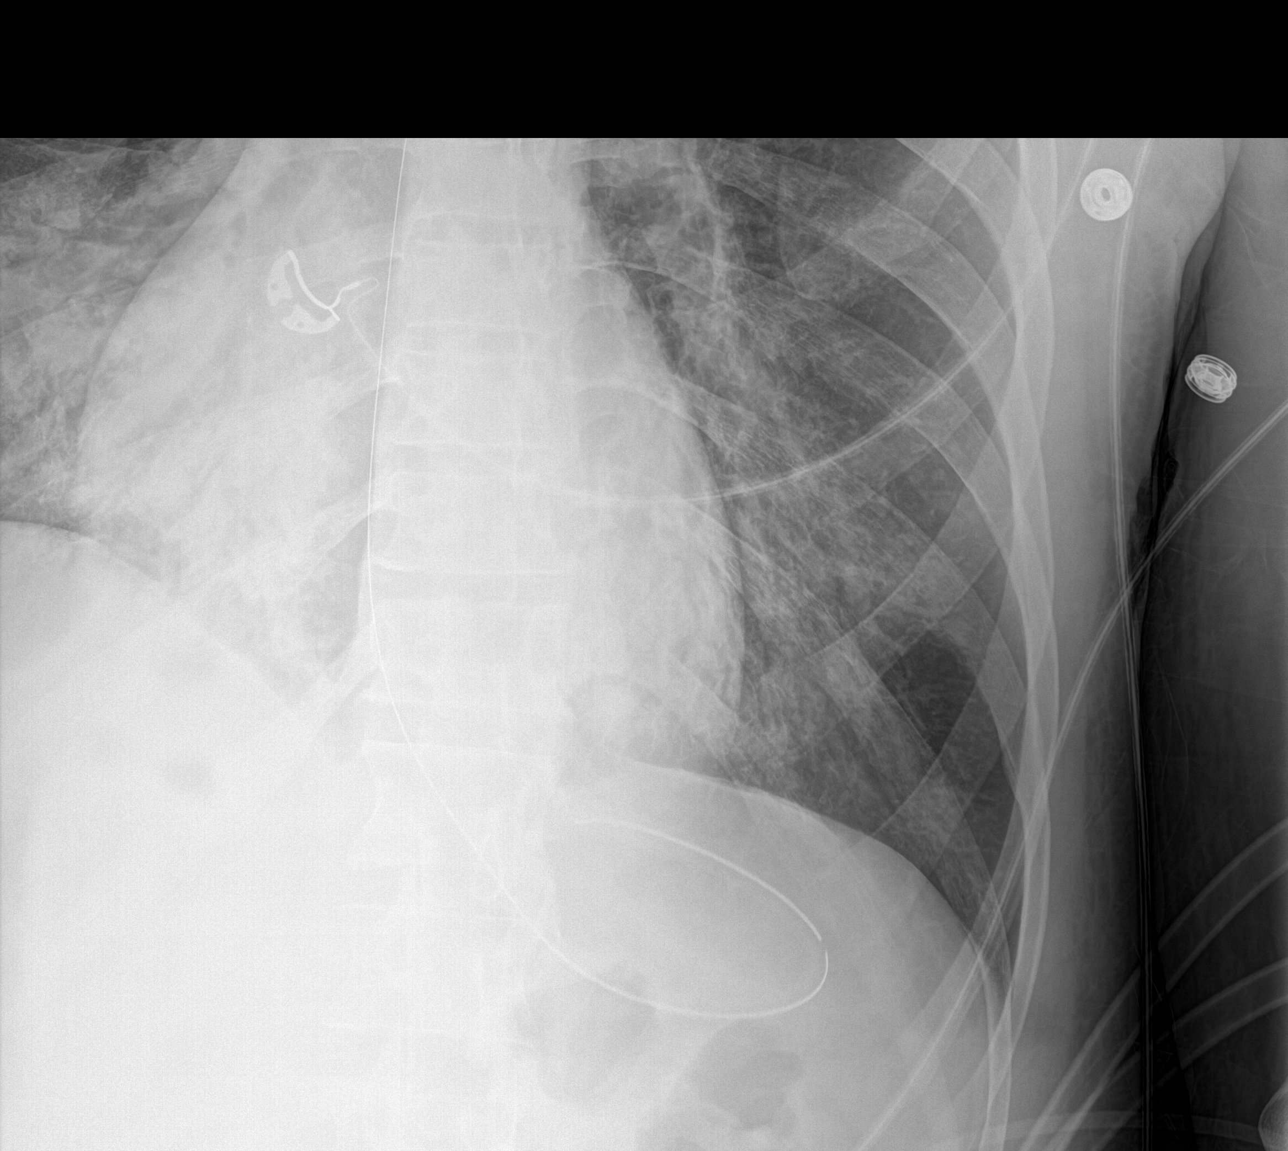

[chest ap (3 of 3)]
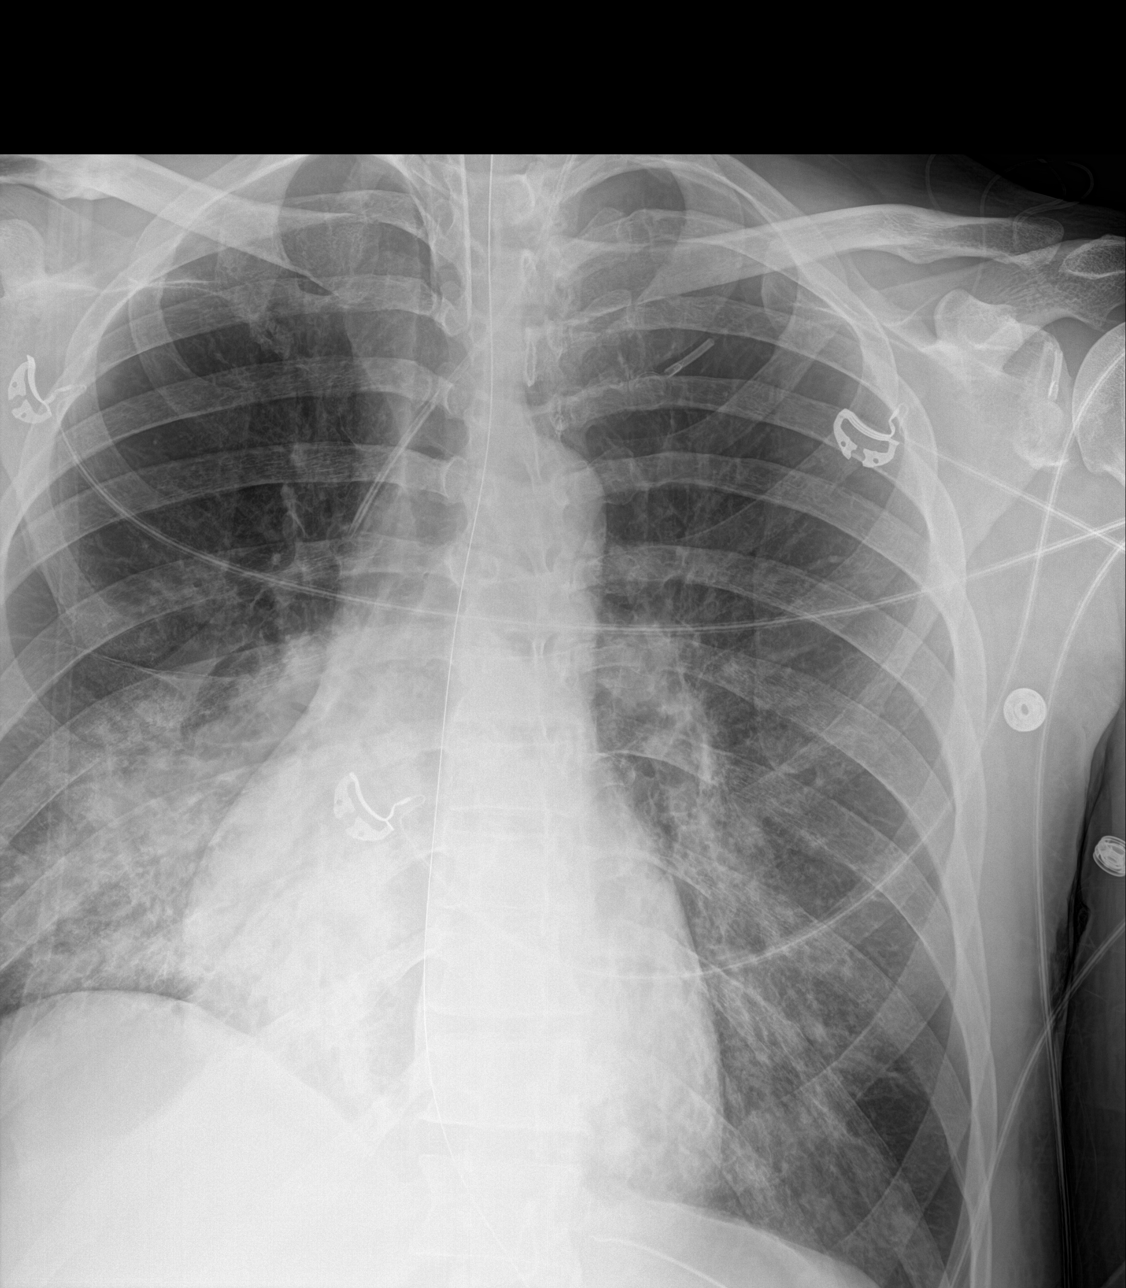

[3 of 3 positions shown; findings below may reference images not displayed]

FINDINGS: Endotracheal tube overlies the upper trachea. Nasogastric tube tip
overlies the stomach. There is a left approach central venous
catheter with tip overlying the mid superior vena cava.

Unchanged cardiomediastinal silhouette. There is persistent
bilateral lower lung airspace disease, right greater than left. No
large pleural effusion or visible pneumothorax. No acute osseous
abnormality.
IMPRESSION: Left approach central venous catheter tip overlies the mid superior
vena cava.

Persistent lower lung airspace disease bilaterally, right greater
than left.

## 2020-12-20 IMAGING — DX DG CHEST 1V PORT
1 series · 2 of 2 positions shown · non-contrast
Comparison: Portable exam [1R] hours compared to [DATE]

CLINICAL DATA: Acute respiratory failure with hypercapnia

EXAM:
PORTABLE CHEST 1 VIEW

[Series 1: chest · 0.14mm/px · 2 of 2 slices shown]
[im 1/2]
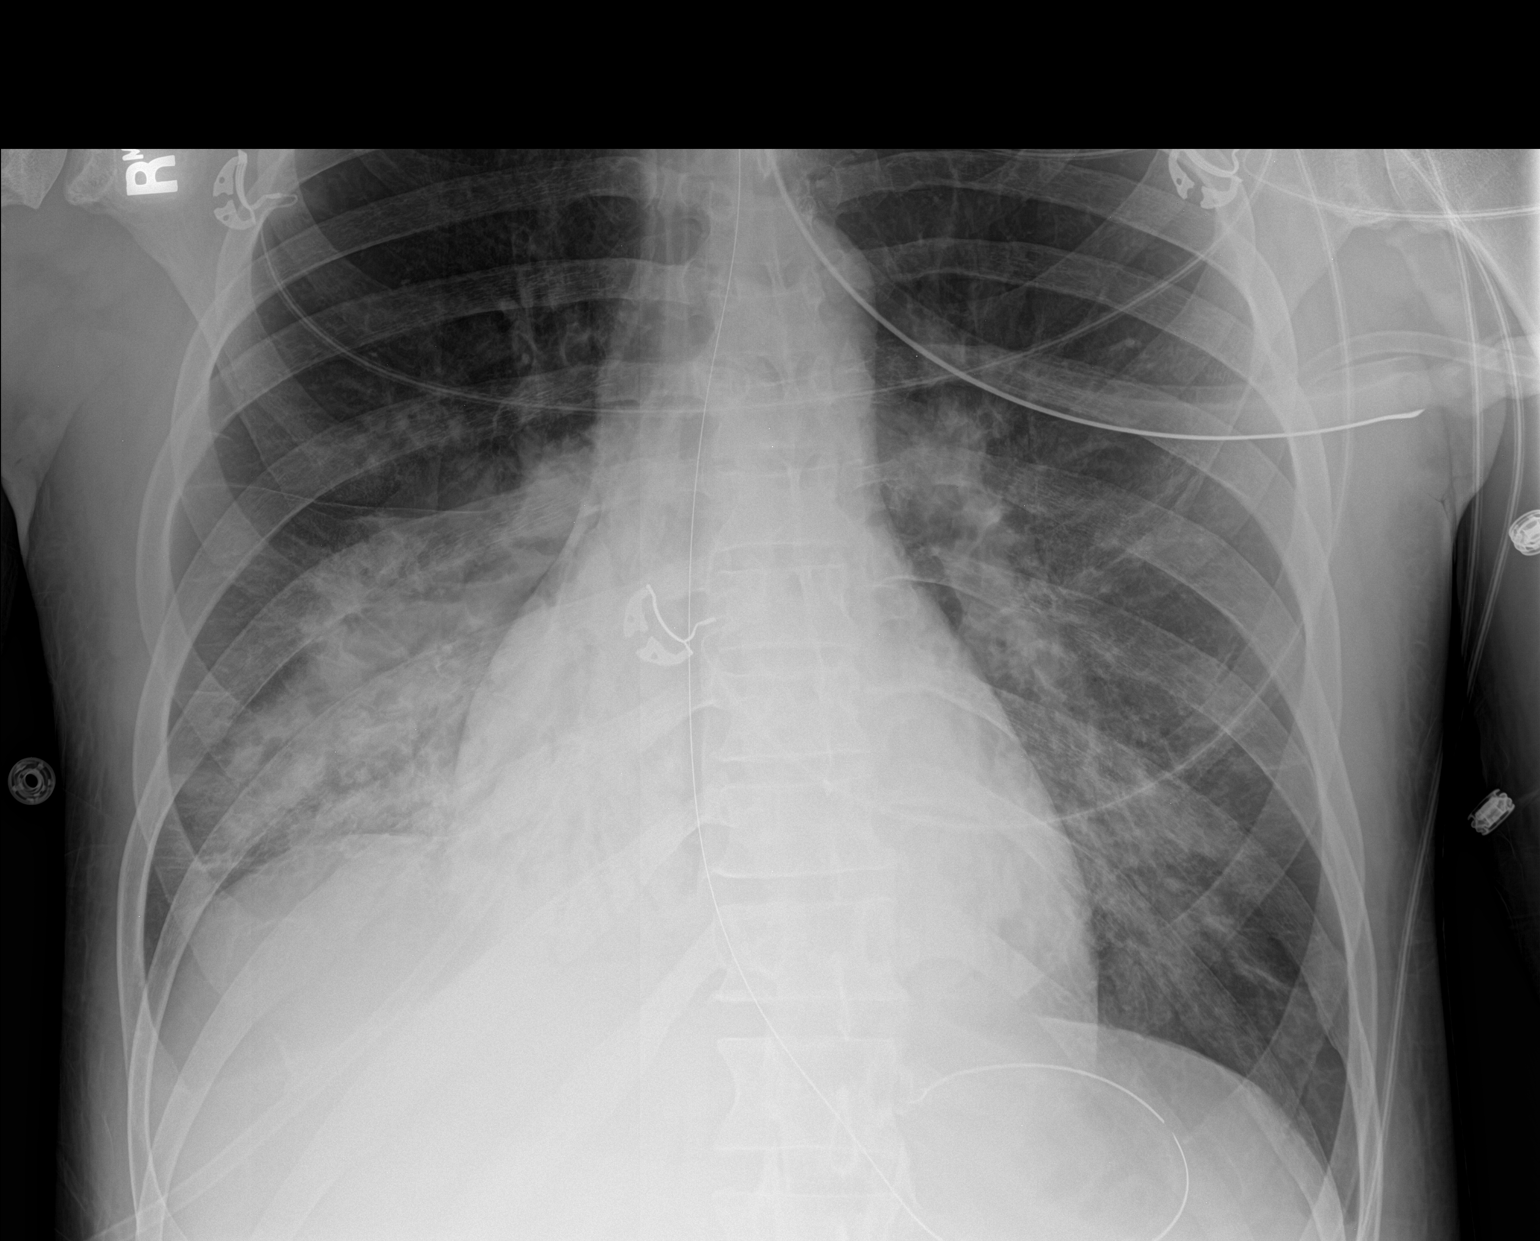
[im 2/2]
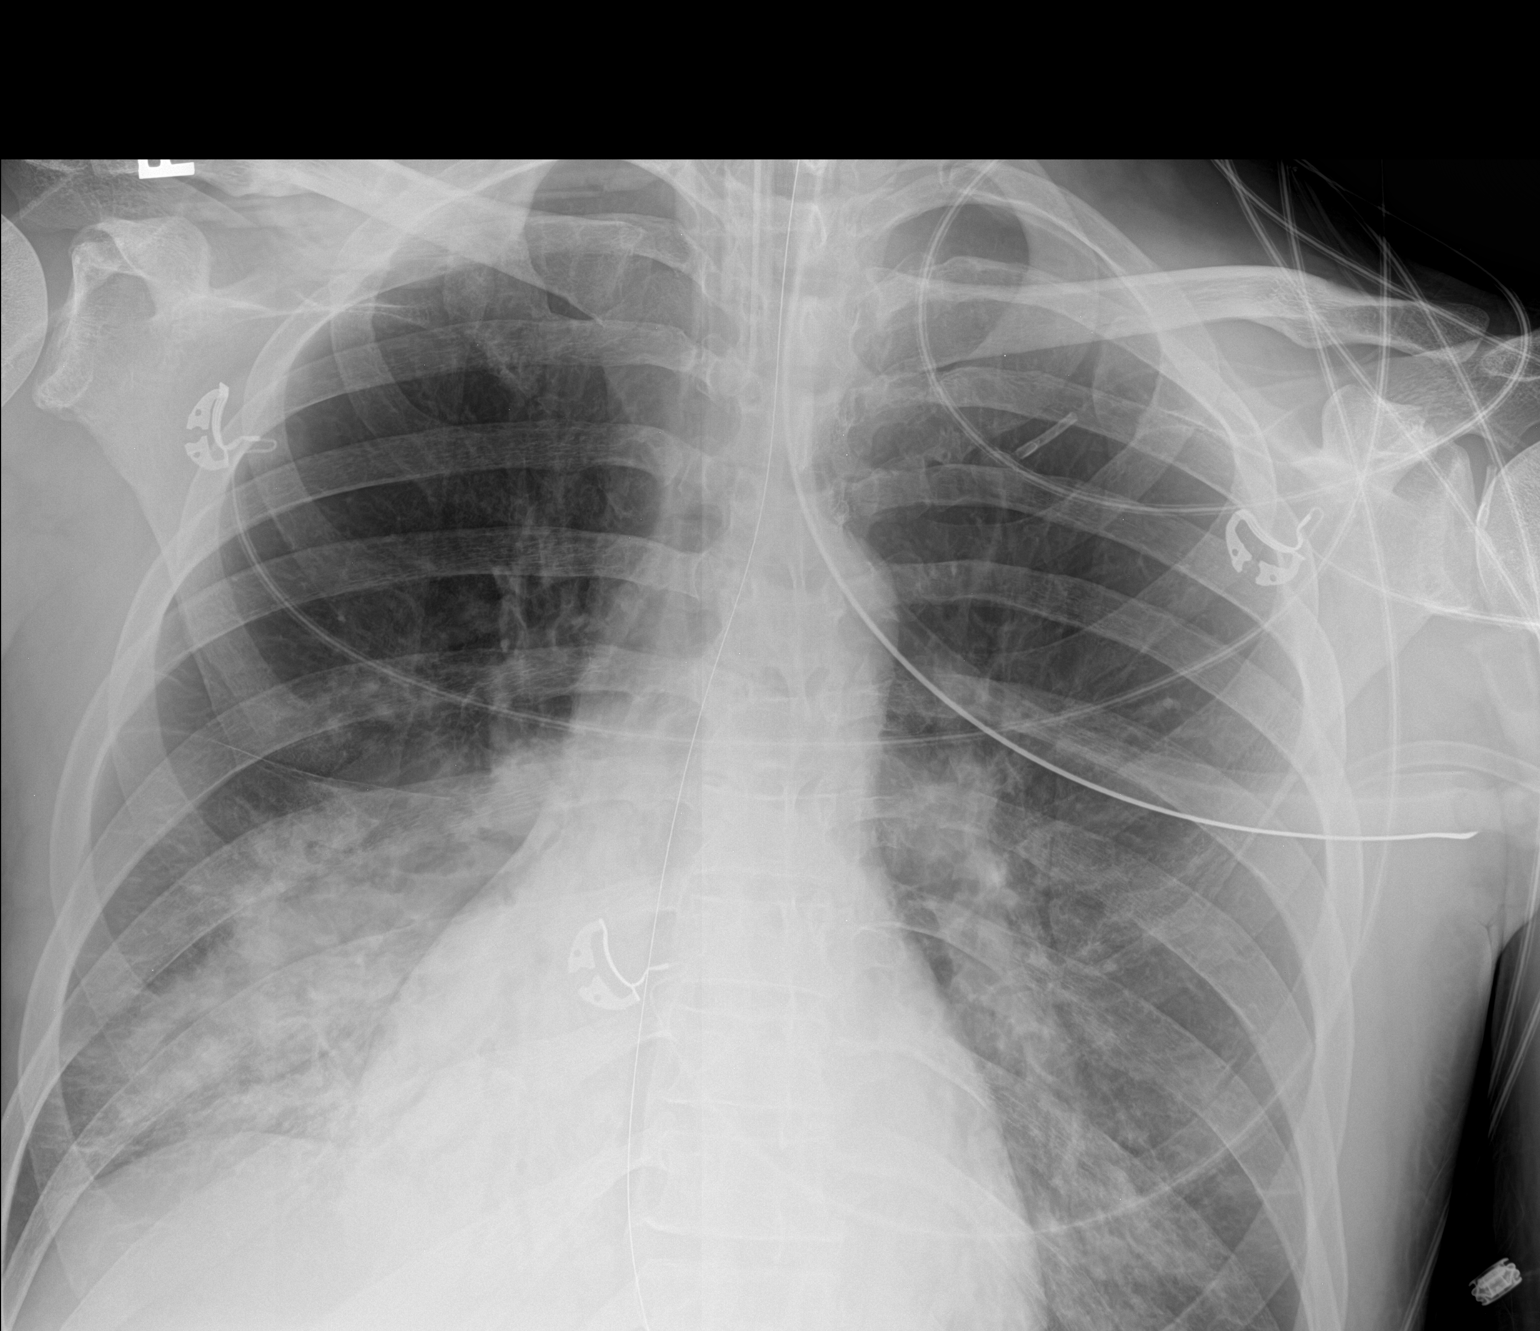

[2 of 2 positions shown; findings below may reference images not displayed]

FINDINGS: Tip of endotracheal tube projects 8.0 cm above carina.

Nasogastric tube coiled in proximal stomach.

Normal heart size, mediastinal contours, and pulmonary vascularity.

BILATERAL lower lobe airspace consolidation greater on RIGHT
consistent with either pneumonia or aspiration.

Upper lungs clear.

No pleural effusion or pneumothorax.
IMPRESSION: BILATERAL lower lobe infiltrates greater on RIGHT either
representing pneumonia or aspiration.

## 2020-12-20 MED ORDER — SODIUM CHLORIDE 0.9 % IV SOLN
30.0000 mg/h | INTRAVENOUS | Status: DC
  Administered 2020-12-20: 30 mg/h via INTRAVENOUS
  Filled 2020-12-20: qty 50

## 2020-12-20 MED ORDER — SODIUM CHLORIDE 0.9% FLUSH
10.0000 mL | Freq: Two times a day (BID) | INTRAVENOUS | Status: DC
Start: 2020-12-20 — End: 2021-01-14
  Administered 2020-12-20 – 2020-12-21 (×2): 10 mL
  Administered 2020-12-21: 40 mL
  Administered 2020-12-22 – 2020-12-23 (×3): 10 mL
  Administered 2020-12-23: 20 mL
  Administered 2020-12-24 – 2021-01-12 (×24): 10 mL

## 2020-12-20 MED ORDER — FUROSEMIDE 10 MG/ML IJ SOLN
60.0000 mg | Freq: Once | INTRAMUSCULAR | Status: AC
  Administered 2020-12-20: 60 mg via INTRAVENOUS
  Filled 2020-12-20: qty 6

## 2020-12-20 MED ORDER — MIDAZOLAM HCL 2 MG/2ML IJ SOLN
4.0000 mg | INTRAMUSCULAR | Status: DC | PRN
  Administered 2020-12-23 – 2020-12-30 (×16): 4 mg via INTRAVENOUS
  Filled 2020-12-20 (×18): qty 4

## 2020-12-20 MED ORDER — PIPERACILLIN-TAZOBACTAM 3.375 G IVPB
3.3750 g | Freq: Three times a day (TID) | INTRAVENOUS | Status: DC
  Administered 2020-12-20 – 2020-12-23 (×9): 3.375 g via INTRAVENOUS
  Filled 2020-12-20 (×12): qty 50

## 2020-12-20 MED ORDER — MAGNESIUM SULFATE 4 GM/100ML IV SOLN
4.0000 g | Freq: Once | INTRAVENOUS | Status: AC
  Administered 2020-12-20: 4 g via INTRAVENOUS
  Filled 2020-12-20: qty 100

## 2020-12-20 MED ORDER — SODIUM CHLORIDE 0.9% FLUSH
10.0000 mL | INTRAVENOUS | Status: DC | PRN
  Administered 2020-12-22: 10 mL
  Administered 2020-12-23: 30 mL

## 2020-12-20 MED ORDER — POTASSIUM CHLORIDE 20 MEQ PO PACK
40.0000 meq | PACK | Freq: Once | ORAL | Status: AC
  Administered 2020-12-20: 40 meq

## 2020-12-20 MED ORDER — NOREPINEPHRINE 4 MG/250ML-% IV SOLN
0.0000 ug/min | INTRAVENOUS | Status: DC
  Administered 2020-12-20: 8 ug/min via INTRAVENOUS
  Administered 2020-12-20: 25 ug/min via INTRAVENOUS
  Filled 2020-12-20 (×2): qty 250

## 2020-12-20 NOTE — Procedures (Signed)
Central Venous Catheter Insertion Procedure Note  THATCHER LOCKAMY  [id number]  19-Dec-1975  Date:12/20/20  Time:2:50 PM   Provider Performing:Lestat Golob Gilford Rile and Kennieth Rad, NP  Procedure: Insertion of Non-tunneled Central Venous [phone]) with US guidance [id number])   Indication(s) Medication administration and Difficult access  Consent Risks of the procedure as well as the alternatives and risks of each were explained to the patient and/or caregiver.  Consent for the procedure was obtained and is signed in the bedside chart  Anesthesia Topical only with 1% lidocaine   Timeout Verified patient identification, verified procedure, site/side was marked, verified correct patient position, special equipment/implants available, medications/allergies/relevant history reviewed, required imaging and test results available.  Sterile Technique Maximal sterile technique including full sterile barrier drape, hand hygiene, sterile gown, sterile gloves, mask, hair covering, sterile ultrasound probe cover (if used).  Procedure Description Area of catheter insertion was cleaned with chlorhexidine and draped in sterile fashion.  With real-time ultrasound guidance a central venous catheter was placed into the left internal jugular vein to 19 cm depth. Nonpulsatile blood flow and easy flushing noted in all ports.  The catheter was sutured in place and sterile dressing applied.  Complications/Tolerance None; patient tolerated the procedure well. Chest X-ray is ordered to verify placement for internal jugular or subclavian cannulation.   Chest x-ray is not ordered for femoral cannulation.  EBL Minimal  Specimen(s) None

## 2020-12-20 NOTE — Progress Notes (Signed)
Wasted '240mg'$ /ml of Versed in steri cycle and waste was  witness by Deboraha Sprang RN.

## 2020-12-20 NOTE — Progress Notes (Signed)
Patient prone at [id number] without complications

## 2020-12-20 NOTE — Procedures (Signed)
Arterial Catheter Insertion Procedure Note  MAYEN ALSMAN  [id number]  1975-08-06  Date:12/20/20  Time:3:33 PM    Provider Performing: Maudie Mercury and Kennieth Rad, NP   Procedure: Insertion of Arterial Line [phone]) with US guidance [id number])   Indication(s) Blood pressure monitoring and/or need for frequent ABGs  Consent Risks of the procedure as well as the alternatives and risks of each were explained to the patient and/or caregiver.  Consent for the procedure was obtained and is signed in the bedside chart  Anesthesia None   Time Out Verified patient identification, verified procedure, site/side was marked, verified correct patient position, special equipment/implants available, medications/allergies/relevant history reviewed, required imaging and test results available.   Sterile Technique Maximal sterile technique including full sterile barrier drape, hand hygiene, sterile gown, sterile gloves, mask, hair covering, sterile ultrasound probe cover (if used).   Procedure Description Area of catheter insertion was cleaned with chlorhexidine and draped in sterile fashion. With real-time ultrasound guidance an arterial catheter was placed into the left radial artery.  Appropriate arterial tracings confirmed on monitor.     Complications/Tolerance None; patient tolerated the procedure well.   EBL Minimal   Specimen(s) None

## 2020-12-20 NOTE — Progress Notes (Signed)
Pharmacy Electrolyte Replacement  Recent Labs:  Recent Labs    12/20/20 0448  K 3.7  MG 1.9  PHOS 2.7  CREATININE 1.56*    No critical values noted.   Plan: Supplement 40 mEq K PT x1   Adria Dill, PharmD PGY-1 Acute Care Resident  12/20/2020 7:58 AM

## 2020-12-20 NOTE — Progress Notes (Signed)
Pharmacy Antibiotic Note- Follow-up Consult  Luis Hunter is a 45 y.o. male admitted on 12/16/2020 with multiple witnessed seizures and PMH significant for polysubstance use. Patient is currently heavily sedated with propofol and versed and loaded with phenobarbital 8/15. Patient started on unasyn 8/16 for leukocytosis and Tmax 102.7. Tracheal aspirate 8/15 showed moderate GPCs in chains and clusters and few GNRs. Patient afebrile 8/17 (Tm 99.5) with improved leukocytosis. However, patient began to demonstrate myoclonic activity 8/16 PM requiring increase in AEDs and worsening acidosis on ABG.  Pharmacy has been consulted for change in antibiotics from unasyn>>zosyn d/t worsening clinical status to cover for possible pseudomonas.    Plan: START Zosyn 3.375g IV Q8H  STOP Unasyn 3g IV Q6H  Monitor renal function F/U LOT and de-escalation F/U 8/15 TA (culture re-incubated for better growth)   Height: '5\' 11"'$  (180.3 cm) Weight: 83.2 kg (183 lb 6.8 oz) IBW/kg (Calculated) : 75.3  Temp (24hrs), Avg:98 F (36.7 C), Min:97 F (36.1 C), Max:99.5 F (37.5 C)  Recent Labs  Lab 12/16/20 1118 12/16/20 1454 12/17/20 0503 12/18/20 0551 12/19/20 0542 12/20/20 0448  WBC 12.1*  --  13.8*  --  12.1* 10.8*  CREATININE 0.91  --  1.59* 1.60* 1.41* 1.56*  LATICACIDVEN 6.0* 2.7*  --   --   --   --      Estimated Creatinine Clearance: 63 mL/min (A) (by C-G formula based on SCr of 1.56 mg/dL (H)).    No Known Allergies  Antimicrobials this admission: Unasyn 8/16>>8/17 Zosyn 8/17>>   Dose adjustments this admission: N/A   Microbiology results: 8/15 Sputum: Moderate GPCs in chains and clusters, Few GNRs   8/13 MRSA PCR: Not detected    Adria Dill, PharmD PGY-1 Acute Care Resident  12/20/2020 8:39 AM

## 2020-12-20 NOTE — Progress Notes (Signed)
Subjective: No definite seizures overnight.  Patient had episode of twitching without concomitant EEG change.  This was most likely not an epileptic event.  ROS: Unable to obtain due to poor mental status  Examination  Vital signs in last 24 hours: Temp:  [97.1 F (36.2 C)-100.1 F (37.8 C)] 100.1 F (37.8 C) (08/17 1118) Pulse Rate:  [87-100] 99 (08/17 1030) Resp:  [16-26] 24 (08/17 1030) BP: (90-109)/(47-60) 102/58 (08/17 1030) SpO2:  [88 %-97 %] 94 % (08/17 1030) FiO2 (%):  [40 %-100 %] 90 % (08/17 0859) Weight:  [83.2 kg] 83.2 kg (08/17 0102)  General: lying in bed, NAD CVS: pulse-normal rate and rhythm RS: intubated, CTAB Extremities: normal, cool  Neuro: on propofol'@80mcg'$ /hr, comatose, does not open eyes to noxious stimuli, PERRLA, corneal reflex absent, gag reflex absent, does not withdraw to noxious stimuli in all 4 extremities    Basic Metabolic Panel: Recent Labs  Lab 12/16/20 1118 12/16/20 1846 12/17/20 0503 12/18/20 0551 12/18/20 1856 12/19/20 0542 12/19/20 2046 12/20/20 0445 12/20/20 0448 12/20/20 1058  NA 140   < > 139 142  --  142 143 143 145 142  K 3.4*   < > 3.5 4.2  --  4.3 3.6 3.6 3.7 3.8  CL 106  --  109 115*  --  114*  --   --  114*  --   CO2 24  --  21* 22  --  20*  --   --  22  --   GLUCOSE 102*  --  105* 106*  --  136*  --   --  123*  --   BUN 11  --  11 15  --  24*  --   --  29*  --   CREATININE 0.91  --  1.59* 1.60*  --  1.41*  --   --  1.56*  --   CALCIUM 8.2*  --  8.1* 8.0*  --  7.6*  --   --  7.6*  --   MG 2.1  --  2.0 2.1 1.7 1.6*  --   --  1.9  --   PHOS  --   --  3.1 1.8* 4.3 4.1  --   --  2.7  --    < > = values in this interval not displayed.    CBC: Recent Labs  Lab 12/16/20 1118 12/16/20 1846 12/17/20 0503 12/19/20 0542 12/19/20 2046 12/20/20 0445 12/20/20 0448 12/20/20 1058  WBC 12.1*  --  13.8* 12.1*  --   --  10.8*  --   NEUTROABS 9.6*  --   --   --   --   --   --   --   HGB 13.7   < > 12.2* 11.4* 10.9* 10.9* 11.1*  11.2*  HCT 41.0   < > 35.8* 35.1* 32.0* 32.0* 34.5* 33.0*  MCV 90.5  --  88.8 90.9  --   --  90.6  --   PLT 214  --  215 204  --   --  206  --    < > = values in this interval not displayed.     Coagulation Studies: No results for input(s): LABPROT, INR in the last 72 hours.  Imaging No new brain imaging overnight  ASSESSMENT AND PLAN:  45 year old male with history of epilepsy who presented with status epilepticus in the setting of medication noncompliance.   Status epilepticus (resolved) Epilepsy with breakthrough seizure Acute encephalopathy, postictal and medication induced -LTM  EEG is suggestive of profound diffuse encephalopathy without any definite seizures or epileptiform discharges.   Recommendations -Stop Versed no morning.  If patient remains seizure-free, will plan to wean propofol this afternoon.   -Continue Keppra to 1500 mg twice daily, Vimpat 200 mg twice daily, phenobarbital 65 mg twice daily and phenytoin 100 mg every 8 hours -Continue seizure precautions -Continue LTM EEG while on sedation -As needed IV Ativan 2 mg for clinical seizure-like activity -Management of rest of comorbidities per primary team -Discussed with RN at bedside as well as Dr. Tacy Learn   CRITICAL CARE Performed by: Lora Havens    Total critical care time: 61mnutes   Critical care time was exclusive of separately billable procedures and treating other patients.   Critical care was necessary to treat or prevent imminent or life-threatening deterioration.   Critical care was time spent personally by me on the following activities: development of treatment plan with patient and/or surrogate as well as nursing, discussions with consultants, evaluation of patient's response to treatment, examination of patient, obtaining history from patient or surrogate, ordering and performing treatments and interventions, ordering and review of laboratory studies, ordering and review of radiographic  studies, pulse oximetry and re-evaluation of patient's condition.    PZeb ComfortEpilepsy [facility] For questions after 5pm please refer to AMION to reach the Neurologist on call

## 2020-12-20 NOTE — Progress Notes (Signed)
NAME:  Luis Hunter, MRN:  [id number], DOB:  04-23-1976, LOS: 4 ADMISSION DATE:  12/16/2020, CONSULTATION DATE:  12/20/2020  REFERRING MD:  Starleen Blue, EDP [facility], CHIEF COMPLAINT:  seizures, intubated   History of Present Illness:  History is obtained from chart review since patient is currently intubated and sedated.  45 year old man with prior history of seizures and substance abuse found on the floor unresponsive by his wife.  Multiple witnessed seizures by wife and EMS, received 8 mg of IV Versed in the field and continued to seize on arrival to the ED, received 4 mg of IV Ativan, intubated for airway protection and started on propofol drip.  Loaded with Keppra.  Versed drip was added since he continued to have twitching on maximum dose of propofol.  Head CT was negative.  UDS positive for cocaine and THC  Transferred to [facility] for continuous EEG monitoring  Pertinent  Medical History  Schizophrenia Substance abuse, cocaine Seizure disorder Bipolar and depression  Significant Hospital Events: Including procedures, antibiotic start and stop dates in addition to other pertinent events   8/13 intubated, transferred to [facility] 8/16 respiratory aspirate with gram positive cocci, few gram negative rods 8/16 started on unasyn 8/17 unasyn discontinued 8/17 started on zosyn  Interim History / Subjective:   Overnight, patient having suspected seizure like activity in the form of head shaking. Was given versed x 3, vimpat increased from '100mg'$  BID to '200mg'$  BID. Versed increased to 30. Additionally, patient hypoxic. ABG revealing acidosis. FiO2 increased from 40 to 100.  This morning (8/17), patient heavily sedated in the bed on mechanical ventilation. FiO2 at 90%.  Objective   Blood pressure (!) 102/58, pulse 99, temperature 99.5 F (37.5 C), temperature source Oral, resp. rate (!) 24, height '5\' 11"'$  (1.803 m), weight 83.2 kg, SpO2 94 %.    Vent Mode: PRVC FiO2 (%):  [40 %-100 %] 90 % Set Rate:   [18 bmp-24 bmp] 24 bmp Vt Set:  [550 mL-602 mL] 600 mL PEEP:  [5 cmH20-12 cmH20] 12 cmH20 Plateau Pressure:  [14 cmH20-15 cmH20] 15 cmH20   Intake/Output Summary (Last 24 hours) at 12/20/2020 1117 Last data filed at 12/20/2020 1000 Gross per 24 hour  Intake 3715.16 ml  Output 1525 ml  Net 2190.16 ml   Filed Weights   12/18/20 0400 12/19/20 0400 12/20/20 0102  Weight: 84.5 kg 81.5 kg 83.2 kg    Examination: General: Patient heavily sedated lying in bed on mechanical ventilation. NAD. HENT: ETT to vent, Pinpoint pupils reactive to light. Lungs:  Respiratory effort normal on vent. Minimal rhonchi throughout lung fields, no crackles or wheezes. Cardiovascular: s1 normal, s2 normal. Regular rate, normal rhythm. Grade II/VI holosystolic murmur. No rubs or gallops. Bounding pulses carotid, radial, pedal pulses. Abdomen: Soft, nontender, non distended. Bowel sounds hypoactive. Extremities: Warm. No edema or deformity. No rash. Neuro: sedated, not responsive, RASS -5.    Resolved Hospital Problem list   Hypotension Hypokalemia Hypophosphatemia  Assessment & Plan:  Aspiration Pneumonia Hypotension c/f sepsis Patient with hypotension and febrile (8/16). Fever resolved. Respiratory culture ordered. Gram stain positive for moderate gram positive organisms in clusters and chains; culture pending. Started unasyn 8/16. This morning patient desatting with ABG revealing acidotic. Additionally, patient with new systolic murmur and bounding pulses likely 2/2 to hyperdynamic state in setting of sepsis. Given patients interval worsening respiratory status will switch abx coverage from unasyn to zosyn to give additional coverage for pseudomonas. Chest xray illustrating worsening bilateral lower lung opacities.  Likely correlates with clinical signs of infection/sepsis. - switch unasyn to zosyn - levophed; titrate for MAP >65, SBP >90 - f/u respiratory culture - Continue to monitor vitals  Acute  respiratory failure ARDS Intubated for airway protection, clinical coma. Arterial blood gas with signs of respiratory acidosis and hypoxic overnight. FiO2 increased to 100%. Was on 90% FiO2 this am with no improvement in acidosis. Increased RR and PEEP this morning. Patient still acidotic, will prone patient to improve ventilation. Start lasix in event increased O2 demand related to excess volume. - LTVV; RR and PEEP increased this am - Not candidate for SBT at this time.  Status epilepticus Patient observed to have head shaking overnight, initially thought to be seizure activity. Was given versed x3. Versed and Vimpat increased. Per neurology note, no EEG correlate to indicate that this was true seizure. This movement similar to subtle clinical movements observed in the past, previously suspected and treated as seizure activity. We will discontinue versed drip at this time, considering the increase in medications in response to the head shaking movement, and not actually seizure activity. Will continue propofol at this time and continue to assess patient for seizure activity. Will wean propofol as able. Phenytoin and phenobarbitol at therapeutic levels. Will continue AED per neurology recommendations. Neurology following, appreciate their recommendations. - continue AEDs, per neurology: - Keppra 1500 mg IV BID - Dilantin 100 mg IV q8h - Vimpat 200 mg IV BID - continue long-term EEG monitoring - discontinue versed - continue propofol, hopeful to wean throughout afternoon/evening. - continue phenobarbital '65mg'$  BID  AKI Interval worsening Cr now 1.56 (8/17), was 1.41 (8/16). Patient with some urinary retention, has foley catheter placed. Getting bethanecol. Good UOP at 1.5L yesterday. - Continue to monitor UOP - Avoid nephrotoxic agents - Continue bethanecol  Electrolytes Potassium at 3.7 this am. Will replete and recheck. - 40 mEq potassium chloride  Best Practice (right click and "Reselect  all SmartList Selections" daily)   Diet/type: tubefeeds DVT prophylaxis: LMWH GI prophylaxis: PPI Lines: N/A Foley:  Yes, and it is still needed Code Status:  full code Last date of multidisciplinary goals of care discussion [significant other at bedside, updated]  Labs   CBC: Recent Labs  Lab 12/16/20 1118 12/16/20 1846 12/17/20 0503 12/19/20 0542 12/19/20 2046 12/20/20 0445 12/20/20 0448 12/20/20 1058  WBC 12.1*  --  13.8* 12.1*  --   --  10.8*  --   NEUTROABS 9.6*  --   --   --   --   --   --   --   HGB 13.7   < > 12.2* 11.4* 10.9* 10.9* 11.1* 11.2*  HCT 41.0   < > 35.8* 35.1* 32.0* 32.0* 34.5* 33.0*  MCV 90.5  --  88.8 90.9  --   --  90.6  --   PLT 214  --  215 204  --   --  206  --    < > = values in this interval not displayed.    Basic Metabolic Panel: Recent Labs  Lab 12/16/20 1118 12/16/20 1846 12/17/20 0503 12/18/20 0551 12/18/20 1856 12/19/20 0542 12/19/20 2046 12/20/20 0445 12/20/20 0448 12/20/20 1058  NA 140   < > 139 142  --  142 143 143 145 142  K 3.4*   < > 3.5 4.2  --  4.3 3.6 3.6 3.7 3.8  CL 106  --  109 115*  --  114*  --   --  114*  --  CO2 24  --  21* 22  --  20*  --   --  22  --   GLUCOSE 102*  --  105* 106*  --  136*  --   --  123*  --   BUN 11  --  11 15  --  24*  --   --  29*  --   CREATININE 0.91  --  1.59* 1.60*  --  1.41*  --   --  1.56*  --   CALCIUM 8.2*  --  8.1* 8.0*  --  7.6*  --   --  7.6*  --   MG 2.1  --  2.0 2.1 1.7 1.6*  --   --  1.9  --   PHOS  --   --  3.1 1.8* 4.3 4.1  --   --  2.7  --    < > = values in this interval not displayed.   GFR: Estimated Creatinine Clearance: 63 mL/min (A) (by C-G formula based on SCr of 1.56 mg/dL (H)). Recent Labs  Lab 12/16/20 1118 12/16/20 1454 12/17/20 0503 12/19/20 0542 12/20/20 0448  WBC 12.1*  --  13.8* 12.1* 10.8*  LATICACIDVEN 6.0* 2.7*  --   --   --     Liver Function Tests: Recent Labs  Lab 12/16/20 1118 12/18/20 0551 12/20/20 0448  AST 30  --  37  ALT 18  --   21  ALKPHOS 51  --  46  BILITOT 0.9  --  0.9  PROT 6.5  --  4.8*  ALBUMIN 3.6 2.7* 2.0*   No results for input(s): LIPASE, AMYLASE in the last 168 hours. No results for input(s): AMMONIA in the last 168 hours.  ABG    Component Value Date/Time   PHART 7.242 (L) 12/20/2020 1058   PCO2ART 52.9 (H) 12/20/2020 1058   PO2ART 72 (L) 12/20/2020 1058   HCO3 22.7 12/20/2020 1058   TCO2 24 12/20/2020 1058   ACIDBASEDEF 5.0 (H) 12/20/2020 1058   O2SAT 90.0 12/20/2020 1058     Coagulation Profile: No results for input(s): INR, PROTIME in the last 168 hours.  Cardiac Enzymes: Recent Labs  Lab 12/18/20 1320  CKTOTAL 561*    HbA1C: Hgb A1c MFr Bld  Date/Time Value Ref Range Status  11/20/2019 12:50 AM 5.8 (H) 4.8 - 5.6 % Final    Comment:    (NOTE) Pre diabetes:          5.7%-6.4%  Diabetes:              >6.4%  Glycemic control for   <7.0% adults with diabetes     CBG: Recent Labs  Lab 12/19/20 1516 12/19/20 1931 12/20/20 0007 12/20/20 0355 12/20/20 [place], [place] 12/20/2020 11:17 AM

## 2020-12-20 NOTE — Procedures (Addendum)
Patient Name: Luis Hunter  MRN: [id number]  Epilepsy Attending: Lora Havens  Referring Physician/Provider: Dr Amie [place] Duration: 12/19/2020 1803 to 12/20/2020 1803   Patient history: 45 year old with status epilepticus-has a history of bipolar disorder, depression, schizophrenia polysubstance abuse and seizures with noncompliance to medications. EEG to evaluate for seizure   Level of alertness: comatose   AEDs during EEG study: LEV, PHT, phenobarb, Vimpat, propofol, Versed   Technical aspects: This EEG study was done with scalp electrodes positioned according to the 10-20 International system of electrode placement. Electrical activity was acquired at a sampling rate of '500Hz'$  and reviewed with a high frequency filter of '70Hz'$  and a low frequency filter of '1Hz'$ . EEG data were recorded continuously and digitally stored.    Description:  EEG showed burst suppression pattern with bursts of generalized sharply contoured 3 to 5 Hz theta-delta slowing admixed with 15 to 18 Hz beta activity lasting 1-3 seconds alternating with 10-60 seconds of generalized EEG suppression.  Event button was pressed on 12/19/2020 at 2012.  Per RN patient was noted to have head shaking which was difficult to visualize on camera.  Concomitant EEG before, during and after the event did not show any EEG changes suggest seizure.   IMPRESSION: This study is suggestive of profound diffuse encephalopathy, nonspecific etiology but likely related to sedation, seizure.  No seizures or definite epileptiform discharges were seen during this study.  Event button was pressed on 12/19/2020 at 2012 for head shaking without concomitant EEG change.  This was most likely not an epileptic event.   [name]

## 2020-12-21 DIAGNOSIS — J9602 Acute respiratory failure with hypercapnia: Secondary | ICD-10-CM | POA: Diagnosis not present

## 2020-12-21 DIAGNOSIS — A419 Sepsis, unspecified organism: Secondary | ICD-10-CM | POA: Diagnosis not present

## 2020-12-21 DIAGNOSIS — G40901 Epilepsy, unspecified, not intractable, with status epilepticus: Secondary | ICD-10-CM | POA: Diagnosis not present

## 2020-12-21 DIAGNOSIS — R6521 Severe sepsis with septic shock: Secondary | ICD-10-CM | POA: Diagnosis not present

## 2020-12-21 LAB — COMPREHENSIVE METABOLIC PANEL
ALT: 30 U/L (ref 0–44)
AST: 64 U/L — ABNORMAL HIGH (ref 15–41)
Albumin: 1.7 g/dL — ABNORMAL LOW (ref 3.5–5.0)
Alkaline Phosphatase: 67 U/L (ref 38–126)
Anion gap: 8 (ref 5–15)
BUN: 32 mg/dL — ABNORMAL HIGH (ref 6–20)
CO2: 21 mmol/L — ABNORMAL LOW (ref 22–32)
Calcium: 7.5 mg/dL — ABNORMAL LOW (ref 8.9–10.3)
Chloride: 110 mmol/L (ref 98–111)
Creatinine, Ser: 1.7 mg/dL — ABNORMAL HIGH (ref 0.61–1.24)
GFR, Estimated: 50 mL/min — ABNORMAL LOW (ref >60)
Glucose, Bld: 167 mg/dL — ABNORMAL HIGH (ref 70–99)
Potassium: 3.8 mmol/L (ref 3.5–5.1)
Sodium: 139 mmol/L (ref 135–145)
Total Bilirubin: 0.9 mg/dL (ref 0.3–1.2)
Total Protein: 4.8 g/dL — ABNORMAL LOW (ref 6.5–8.1)

## 2020-12-21 LAB — CBC WITH DIFFERENTIAL/PLATELET
Abs Immature Granulocytes: 0.47 10*3/uL — ABNORMAL HIGH (ref 0.00–0.07)
Basophils Absolute: 0.1 10*3/uL (ref 0.0–0.1)
Basophils Relative: 0 %
Eosinophils Absolute: 0.1 10*3/uL (ref 0.0–0.5)
Eosinophils Relative: 1 %
HCT: 32.5 % — ABNORMAL LOW (ref 39.0–52.0)
Hemoglobin: 11.1 g/dL — ABNORMAL LOW (ref 13.0–17.0)
Immature Granulocytes: 3 %
Lymphocytes Relative: 3 %
Lymphs Abs: 0.6 10*3/uL — ABNORMAL LOW (ref 0.7–4.0)
MCH: 29.4 pg (ref 26.0–34.0)
MCHC: 34.2 g/dL (ref 30.0–36.0)
MCV: 86.2 fL (ref 80.0–100.0)
Monocytes Absolute: 0.7 10*3/uL (ref 0.1–1.0)
Monocytes Relative: 4 %
Neutro Abs: 15.7 10*3/uL — ABNORMAL HIGH (ref 1.7–7.7)
Neutrophils Relative %: 89 %
Platelets: 222 10*3/uL (ref 150–400)
RBC: 3.77 MIL/uL — ABNORMAL LOW (ref 4.22–5.81)
RDW: 15.9 % — ABNORMAL HIGH (ref 11.5–15.5)
WBC: 17.7 10*3/uL — ABNORMAL HIGH (ref 4.0–10.5)
nRBC: 0.3 % — ABNORMAL HIGH (ref 0.0–0.2)

## 2020-12-21 LAB — POCT I-STAT 7, (LYTES, BLD GAS, ICA,H+H)
Acid-Base Excess: 0 mmol/L (ref 0.0–2.0)
Acid-base deficit: 1 mmol/L (ref 0.0–2.0)
Bicarbonate: 23.9 mmol/L (ref 20.0–28.0)
Bicarbonate: 23.2 mmol/L (ref 20.0–28.0)
Calcium, Ion: 1.06 mmol/L — ABNORMAL LOW (ref 1.15–1.40)
Calcium, Ion: 1.06 mmol/L — ABNORMAL LOW (ref 1.15–1.40)
HCT: 31 % — ABNORMAL LOW (ref 39.0–52.0)
HCT: 30 % — ABNORMAL LOW (ref 39.0–52.0)
Hemoglobin: 10.5 g/dL — ABNORMAL LOW (ref 13.0–17.0)
Hemoglobin: 10.2 g/dL — ABNORMAL LOW (ref 13.0–17.0)
O2 Saturation: 99 %
O2 Saturation: 100 %
Patient temperature: 99
Patient temperature: 99.4
Potassium: 3.8 mmol/L (ref 3.5–5.1)
Potassium: 3.6 mmol/L (ref 3.5–5.1)
Sodium: 141 mmol/L (ref 135–145)
Sodium: 141 mmol/L (ref 135–145)
TCO2: 25 mmol/L (ref 22–32)
TCO2: 24 mmol/L (ref 22–32)
pCO2 arterial: 39.3 mmHg (ref 32.0–48.0)
pCO2 arterial: 33.4 mmHg (ref 32.0–48.0)
pH, Arterial: 7.393 (ref 7.350–7.450)
pH, Arterial: 7.452 — ABNORMAL HIGH (ref 7.350–7.450)
pO2, Arterial: 156 mmHg — ABNORMAL HIGH (ref 83.0–108.0)
pO2, Arterial: 174 mmHg — ABNORMAL HIGH (ref 83.0–108.0)

## 2020-12-21 LAB — TRIGLYCERIDES: Triglycerides: 168 mg/dL — ABNORMAL HIGH (ref <150)

## 2020-12-21 LAB — GLUCOSE, CAPILLARY
Glucose-Capillary: 138 mg/dL — ABNORMAL HIGH (ref 70–99)
Glucose-Capillary: 135 mg/dL — ABNORMAL HIGH (ref 70–99)
Glucose-Capillary: 130 mg/dL — ABNORMAL HIGH (ref 70–99)
Glucose-Capillary: 134 mg/dL — ABNORMAL HIGH (ref 70–99)
Glucose-Capillary: 145 mg/dL — ABNORMAL HIGH (ref 70–99)
Glucose-Capillary: 132 mg/dL — ABNORMAL HIGH (ref 70–99)

## 2020-12-21 LAB — PHENOBARBITAL LEVEL: Phenobarbital: 21.6 ug/mL (ref 15.0–30.0)

## 2020-12-21 LAB — PHOSPHORUS: Phosphorus: 4.3 mg/dL (ref 2.5–4.6)

## 2020-12-21 LAB — PHENYTOIN LEVEL, TOTAL: Phenytoin Lvl: 11.2 ug/mL (ref 10.0–20.0)

## 2020-12-21 LAB — MAGNESIUM: Magnesium: 2.4 mg/dL (ref 1.7–2.4)

## 2020-12-21 MED ORDER — HYDRALAZINE HCL 20 MG/ML IJ SOLN
10.0000 mg | Freq: Four times a day (QID) | INTRAMUSCULAR | Status: DC | PRN
  Administered 2020-12-21 – 2021-01-04 (×6): 10 mg via INTRAVENOUS
  Filled 2020-12-21 (×6): qty 1

## 2020-12-21 MED ORDER — ALTEPLASE 2 MG IJ SOLR
2.0000 mg | Freq: Once | INTRAMUSCULAR | Status: AC
  Administered 2020-12-21: 2 mg
  Filled 2020-12-21: qty 2

## 2020-12-21 MED ORDER — POTASSIUM CHLORIDE 20 MEQ PO PACK
40.0000 meq | PACK | Freq: Once | ORAL | Status: AC
  Administered 2020-12-21: 40 meq
  Filled 2020-12-21: qty 2

## 2020-12-21 MED ORDER — SENNA 8.6 MG PO TABS
1.0000 | ORAL_TABLET | Freq: Two times a day (BID) | ORAL | Status: DC
  Administered 2020-12-21 – 2020-12-22 (×2): 8.6 mg
  Filled 2020-12-21 (×4): qty 1

## 2020-12-21 MED ORDER — VITAL 1.5 CAL PO LIQD
1000.0000 mL | ORAL | Status: DC
  Administered 2020-12-21 – 2021-01-16 (×35): 1000 mL
  Filled 2020-12-21 (×12): qty 1000

## 2020-12-21 MED ORDER — HYDRALAZINE HCL 20 MG/ML IJ SOLN
INTRAMUSCULAR | Status: AC
  Filled 2020-12-21: qty 1

## 2020-12-21 MED ORDER — STERILE WATER FOR INJECTION IJ SOLN
INTRAMUSCULAR | Status: AC
  Administered 2020-12-21: 10 mL
  Filled 2020-12-21: qty 10

## 2020-12-21 NOTE — Progress Notes (Addendum)
NAME:  Luis Hunter, MRN:  [id number], DOB:  [birth year], LOS: 5 ADMISSION DATE:  12/16/2020, CONSULTATION DATE:  12/21/2020  REFERRING MD:  Starleen Blue, EDP [facility], CHIEF COMPLAINT:  seizures, intubated   History of Present Illness:  History is obtained from chart review since patient is currently intubated and sedated.  45 year old man with prior history of seizures and substance abuse found on the floor unresponsive by his wife.  Multiple witnessed seizures by wife and EMS, received 8 mg of IV Versed in the field and continued to seize on arrival to the ED, received 4 mg of IV Ativan, intubated for airway protection and started on propofol drip.  Loaded with Keppra.  Versed drip was added since he continued to have twitching on maximum dose of propofol.  Head CT was negative.  UDS positive for cocaine and THC  Transferred to [facility] for continuous EEG monitoring  Pertinent  Medical History  Schizophrenia Substance abuse, cocaine Seizure disorder Bipolar and depression  Significant Hospital Events: Including procedures, antibiotic start and stop dates in addition to other pertinent events   8/13 intubated, transferred to [facility] 8/16 respiratory aspirate with gram positive cocci, few gram negative rods 8/16 started on unasyn 8/17 unasyn discontinued 8/17 started on zosyn  Interim History / Subjective:   Patient sedated in bed this morning in prone position. Levophed weaned off overnight. Patient now with gag reflex present. No other acute events overnight.   Objective   Blood pressure 121/73, pulse (!) 58, temperature 99 F (37.2 C), temperature source Oral, resp. rate (!) 27, height '5\' 11"'$  (1.803 m), weight 86.8 kg, SpO2 100 %.    Vent Mode: PRVC FiO2 (%):  [70 %-100 %] 70 % Set Rate:  [30 bmp] 30 bmp Vt Set:  [600 mL] 600 mL PEEP:  [12 cmH20] 12 cmH20 Plateau Pressure:  [23 cmH20-28 cmH20] 25 cmH20   Intake/Output Summary (Last 24 hours) at 12/21/2020 0928 Last data filed at 12/21/2020  0916 Gross per 24 hour  Intake 4033.79 ml  Output 2705 ml  Net 1328.79 ml   Filed Weights   12/19/20 0400 12/20/20 0102 12/21/20 0500  Weight: 81.5 kg 83.2 kg 86.8 kg    Examination: General: Patient heavily sedated lying in bed in prone position on mechanical ventilation. NAD. HENT: ETT to vent, Pinpoint pupils reactive to light. Lungs:  Respiratory effort normal on vent. Good air movement. No rhonchi, crackles, or wheezes. Cardiovascular: s1 normal, s2 normal. Regular rate, normal rhythm. No rubs or gallops. Abdomen: Soft, nontender, non distended. Bowel sounds present. Extremities: Warm. No edema or deformity. No rash. Neuro: sedated, not responsive, gag reflex present. RASS -5.    Resolved Hospital Problem list   Hypotension Hypokalemia Hypophosphatemia  Assessment & Plan:  Aspiration Pneumonia Hypotension c/f sepsis Patient initially with hypotension and febrile 8/16. Respiratory culture w/ moderate gram positive cocci and few gram negative rods. Culture with normal respiratory flora, without evidence of staph aureus or pseudomonas. Started initially on unasyn (8/16) that was switched to zosyn (8/17) to provide increased coverage for pseudomonas (culture had not resulted at that time) 2/2 worsening clinical status, hypoxic, respiratory acidosis, and hypotensive requiring increased pressor support. Despite respiratory culture, inclined to continue zosyn at this time given interval improvement in patients hypotension (weaned off pressors), respiratory status, and vitals. - continue zosyn for total of 6 days (now day 2 of zosyn; day 3 of antibiotics) - levophed weaned off. Continue to monitor BP, low threshold to provide pressor support. -  Continue to monitor vitals  Acute respiratory failure ARDS Interval improvement in respiratory status. FiO2 now 70%, down from 100% yesterday (8/17). Will continue current therapies. Was proned overnight (16 hours). ABG revealed P:F ratio 195  (Horowitz Index), therefore, patient does not meet criteria for proning today. - LTVV; plateau pressure <30. - Not candidate for SBT at this time.  Bradycardic Patient bradycardic on telemetry. Will order ECG. Unsure of etiology. Vimpat has been known to cause cardiac toxicity. With discuss with neurology to determine if vimpat could be culprit/possibility of changing AEDs. - f/u ECG - Discuss with neurology.  Status epilepticus Per discussion with Neurology, no seizure activity observed on EEG. Patient without clinical signs of seizure overnight. Propofol and versed discontinued. No changes to AEDs at this time. Management largely per neurology. As noted above, will discuss with neurology about patient's bradycardia as it may relate to vimpat dosing. - continue AEDs, per neurology: - Keppra 1500 mg IV BID - Dilantin 100 mg IV q8h - Vimpat 200 mg IV BID - Phenobarbital 65 mg BID - continue long-term EEG monitoring  AKI Interval worsening Cr now 1.70 (8/17), was 1.56 (8/16). Patient with some urinary retention, has foley catheter placed. Getting bethanecol. Good UOP at 2.5L. - Continue to monitor UOP - Avoid nephrotoxic agents - Continue bethanecol - Foley catheter in place; voiding trial if not going to be proned this evening.  Electrolytes Potassium at 3.8 this am. Will replete and recheck.   Best Practice (right click and "Reselect all SmartList Selections" daily)   Diet/type: tubefeeds DVT prophylaxis: LMWH GI prophylaxis: PPI Lines: N/A Foley:  Yes, and it is still needed Code Status:  full code Last date of multidisciplinary goals of care discussion [significant other at bedside, updated]  Labs   CBC: Recent Labs  Lab 12/16/20 1118 12/16/20 1846 12/17/20 0503 12/19/20 0542 12/19/20 2046 12/20/20 0445 12/20/20 0448 12/20/20 1058 12/21/20 0440  WBC 12.1*  --  13.8* 12.1*  --   --  10.8*  --  17.7*  NEUTROABS 9.6*  --   --   --   --   --   --   --  15.7*  HGB  13.7   < > 12.2* 11.4* 10.9* 10.9* 11.1* 11.2* 11.1*  HCT 41.0   < > 35.8* 35.1* 32.0* 32.0* 34.5* 33.0* 32.5*  MCV 90.5  --  88.8 90.9  --   --  90.6  --  86.2  PLT 214  --  215 204  --   --  206  --  222   < > = values in this interval not displayed.    Basic Metabolic Panel: Recent Labs  Lab 12/17/20 0503 12/18/20 0551 12/18/20 1856 12/19/20 0542 12/19/20 2046 12/20/20 0445 12/20/20 0448 12/20/20 1058 12/21/20 0440  NA 139 142  --  142 143 143 145 142 139  K 3.5 4.2  --  4.3 3.6 3.6 3.7 3.8 3.8  CL 109 115*  --  114*  --   --  114*  --  110  CO2 21* 22  --  20*  --   --  22  --  21*  GLUCOSE 105* 106*  --  136*  --   --  123*  --  167*  BUN 11 15  --  24*  --   --  29*  --  32*  CREATININE 1.59* 1.60*  --  1.41*  --   --  1.56*  --  1.70*  CALCIUM 8.1* 8.0*  --  7.6*  --   --  7.6*  --  7.5*  MG 2.0 2.1 1.7 1.6*  --   --  1.9  --  2.4  PHOS 3.1 1.8* 4.3 4.1  --   --  2.7  --  4.3   GFR: Estimated Creatinine Clearance: 57.8 mL/min (A) (by C-G formula based on SCr of 1.7 mg/dL (H)). Recent Labs  Lab 12/16/20 1118 12/16/20 1454 12/17/20 0503 12/19/20 0542 12/20/20 0448 12/21/20 0440  WBC 12.1*  --  13.8* 12.1* 10.8* 17.7*  LATICACIDVEN 6.0* 2.7*  --   --   --   --     Liver Function Tests: Recent Labs  Lab 12/16/20 1118 12/18/20 0551 12/20/20 0448 12/21/20 0440  AST 30  --  37 64*  ALT 18  --  21 30  ALKPHOS 51  --  46 67  BILITOT 0.9  --  0.9 0.9  PROT 6.5  --  4.8* 4.8*  ALBUMIN 3.6 2.7* 2.0* 1.7*   No results for input(s): LIPASE, AMYLASE in the last 168 hours. No results for input(s): AMMONIA in the last 168 hours.  ABG    Component Value Date/Time   PHART 7.242 (L) 12/20/2020 1058   PCO2ART 52.9 (H) 12/20/2020 1058   PO2ART 72 (L) 12/20/2020 1058   HCO3 22.7 12/20/2020 1058   TCO2 24 12/20/2020 1058   ACIDBASEDEF 5.0 (H) 12/20/2020 1058   O2SAT 90.0 12/20/2020 1058     Coagulation Profile: No results for input(s): INR, PROTIME in the last  168 hours.  Cardiac Enzymes: Recent Labs  Lab 12/18/20 1320  CKTOTAL 561*    HbA1C: Hgb A1c MFr Bld  Date/Time Value Ref Range Status  11/20/2019 12:50 AM 5.8 (H) 4.8 - 5.6 % Final    Comment:    (NOTE) Pre diabetes:          5.7%-6.4%  Diabetes:              >6.4%  Glycemic control for   <7.0% adults with diabetes     CBG: Recent Labs  Lab 12/20/20 1512 12/20/20 2012 12/20/20 2307 12/21/20 0424 12/21/20 [place], [place] 12/21/2020 9:28 AM

## 2020-12-21 NOTE — Progress Notes (Signed)
Patient is in junctional rhythm, MD at bedside and is aware.

## 2020-12-21 NOTE — Progress Notes (Signed)
Performed EEG maintenance.  Reapplied multiple electrodes.  No skin breakdown observed.

## 2020-12-21 NOTE — Progress Notes (Addendum)
Subjective: No acute events overnight.  No clinical seizures.  ROS: Unable to obtain due to poor mental status  Examination  Vital signs in last 24 hours: Temp:  [99 F (37.2 C)-101.3 F (38.5 C)] 99 F (37.2 C) (08/18 0719) Pulse Rate:  [46-101] 58 (08/18 0800) Resp:  [26-30] 27 (08/18 0800) BP: (96-135)/(49-87) 121/73 (08/18 0800) SpO2:  [90 %-100 %] 100 % (08/18 0807) Arterial Line BP: (83-279)/(-10-66) 136/63 (08/18 0800) FiO2 (%):  [70 %-100 %] 70 % (08/18 0807) Weight:  [86.8 kg] 86.8 kg (08/18 0500)  General: lying in bed, NAD CVS: pulse-normal rate and rhythm RS: intubated, CTAB Extremities: normal, cool  Neuro: comatose, does not open eyes to noxious stimuli, PERRLA, corneal reflex absent, gag reflex absent for me but was presented earlier per RN, does not withdraw to noxious stimuli in all 4 extremities    Basic Metabolic Panel: Recent Labs  Lab 12/17/20 0503 12/18/20 0551 12/18/20 1856 12/19/20 0542 12/19/20 2046 12/20/20 0445 12/20/20 0448 12/20/20 1058 12/21/20 0440  NA 139 142  --  142 143 143 145 142 139  K 3.5 4.2  --  4.3 3.6 3.6 3.7 3.8 3.8  CL 109 115*  --  114*  --   --  114*  --  110  CO2 21* 22  --  20*  --   --  22  --  21*  GLUCOSE 105* 106*  --  136*  --   --  123*  --  167*  BUN 11 15  --  24*  --   --  29*  --  32*  CREATININE 1.59* 1.60*  --  1.41*  --   --  1.56*  --  1.70*  CALCIUM 8.1* 8.0*  --  7.6*  --   --  7.6*  --  7.5*  MG 2.0 2.1 1.7 1.6*  --   --  1.9  --  2.4  PHOS 3.1 1.8* 4.3 4.1  --   --  2.7  --  4.3    CBC: Recent Labs  Lab 12/16/20 1118 12/16/20 1846 12/17/20 0503 12/19/20 0542 12/19/20 2046 12/20/20 0445 12/20/20 0448 12/20/20 1058 12/21/20 0440  WBC 12.1*  --  13.8* 12.1*  --   --  10.8*  --  17.7*  NEUTROABS 9.6*  --   --   --   --   --   --   --  15.7*  HGB 13.7   < > 12.2* 11.4* 10.9* 10.9* 11.1* 11.2* 11.1*  HCT 41.0   < > 35.8* 35.1* 32.0* 32.0* 34.5* 33.0* 32.5*  MCV 90.5  --  88.8 90.9  --   --   90.6  --  86.2  PLT 214  --  215 204  --   --  206  --  222   < > = values in this interval not displayed.     Coagulation Studies: No results for input(s): LABPROT, INR in the last 72 hours.  Imaging No new brain imaging overnight   ASSESSMENT AND PLAN:  45 year old male with history of epilepsy who presented with status epilepticus in the setting of medication noncompliance.   Status epilepticus (resolved) Epilepsy with breakthrough seizure Acute encephalopathy, postictal and medication induced -LTM EEG showed periodic discharges with triphasic morphology at 1 Hz which is on the ictal-interictal continuum with low potential for seizures.  Of note, this EEG pattern can also be seen with toxic-metabolic causes. There is also severe diffuse encephalopathy,  nonspecific etiology. No seizures were seen during this study.   Recommendations -Continue LTM EEG till tomorrow morning.  If no seizures will likely discontinue - Will consider MRI brain tomorrow after discontinuing EEG -Continue Keppra to 1500 mg twice daily, Vimpat 200 mg twice daily, phenobarbital 65 mg twice daily and phenytoin 100 mg every 8 hours -Continue seizure precautions -As needed IV Ativan 2 mg for clinical seizure-like activity -Management of rest of comorbidities per primary team  I have spent a total of 25  minutes with the patient reviewing hospital notes,  test results, labs and examining the patient as well as establishing an assessment and plan.  > 50% of time was spent in direct patient care.   Zeb Comfort Epilepsy [facility] For questions after 5pm please refer to AMION to reach the Neurologist on call

## 2020-12-21 NOTE — Progress Notes (Signed)
Nutrition Follow-up  DOCUMENTATION CODES:   Not applicable  INTERVENTION:   -D/c Vital High Protein -Initiate Vital 1.5 @ 65 ml/hr via OGT (1560 ml daily)  45 ml Prosource TF daily  30 ml free water flush every 4 hours  Tube feeding regimen provides 2380 kcal (96% of needs), 116 grams of protein, and 1192 ml of H2O.  Total free water: 1372  NUTRITION DIAGNOSIS:   Inadequate oral intake related to inability to eat as evidenced by NPO status.  Ongoing  GOAL:   Patient will meet greater than or equal to 90% of their needs  Met with TF  MONITOR:   Vent status, Labs, Weight trends, TF tolerance, Skin, I & O's  REASON FOR ASSESSMENT:   Consult, Ventilator Enteral/tube feeding initiation and management  ASSESSMENT:   45 year old man with prior history of seizures and substance abuse found on the floor unresponsive by his wife.  Multiple witnessed seizures by wife and EMS, received 8 mg of IV Versed in the field and continued to seize on arrival to the ED, received 4 mg of IV Ativan, intubated for airway protection and started on propofol drip.  Loaded with Keppra.  Versed drip was added since he continued to have twitching on maximum dose of propofol.  Head CT was negative.  UDS positive for cocaine and THC  8/13- CT of head did not reveal any acute intracranial findings  Patient is currently intubated on ventilator support MV: 17.9 L/min Temp (24hrs), Avg:100 F (37.8 C), Min:99 F (37.2 C), Max:101.3 F (38.5 C)  Reviewed I/O's: +1.7 L x 24 hours and +9 L since admission  UOP: 2.6 L x 24 hours  Pt remains on continuous EEG monitoring. Pt remains free of seizure activity per neurology notes. Possible plan to d/c continuous EEG tomorrow (12/22/20) if no further seizure activity is present.   Propofol has been d/c. Pt now in prone position.   Medications reviewed and include colace, miralax, and levophed.   Labs reviewed: CBGS: 127-138 (inpatient orders for  glycemic control are 0-9 units insulin aspart every 4 hours).    Diet Order:   Diet Order             Diet NPO time specified  Diet effective now                   EDUCATION NEEDS:   Not appropriate for education at this time  Skin:  Skin Assessment: Skin Integrity Issues: Skin Integrity Issues:: Other (Comment) Other: skin tear to lt buttocks  Last BM:  Unknown  Height:   Ht Readings from Last 1 Encounters:  12/16/20 '5\' 11"'  (1.803 m)    Weight:   Wt Readings from Last 1 Encounters:  12/21/20 86.8 kg    Ideal Body Weight:  78.2 kg  BMI:  Body mass index is 26.69 kg/m.  Estimated Nutritional Needs:   Kcal:  2474  Protein:  110-125 grams  Fluid:  > 2 L    Loistine Chance, RD, LDN, [place] Registered Dietitian II Certified Diabetes Care and Education Specialist Please refer to [facility] for RD and/or RD on-call/weekend/after hours pager

## 2020-12-21 NOTE — Procedures (Addendum)
Patient Name: DORA PRAH  MRN: [id number]  Epilepsy Attending: Lora Havens  Referring Physician/Provider: Dr Amie [place] Duration: 12/20/2020 1803 to 12/21/2020 1803   Patient history: 45 year old with status epilepticus-has a history of bipolar disorder, depression, schizophrenia polysubstance abuse and seizures with noncompliance to medications. EEG to evaluate for seizure   Level of alertness: awake, asleep   AEDs during EEG study: LEV, PHT, phenobarb, Vimpat   Technical aspects: This EEG study was done with scalp electrodes positioned according to the 10-20 International system of electrode placement. Electrical activity was acquired at a sampling rate of '500Hz'$  and reviewed with a high frequency filter of '70Hz'$  and a low frequency filter of '1Hz'$ . EEG data were recorded continuously and digitally stored.    Description: No clear posterior dominant rhythm was seen.  Sleep was characterized by sleep spindles (12 to 14 Hz), maximum frontocentral region. EEG showed continuous generalized 3-5 Hz theta-delta slowing as well as generalized periodic discharges with triphasic morphology at 1 Hz.  ABNORMALITY -Periodic discharges with triphasic morphology, generalized -Continuous slow, generalized  IMPRESSION: This study showed periodic discharges with triphasic morphology at 1 Hz which is on the ictal-interictal continuum with low potential for seizures.  Of note, this EEG pattern can also be seen with toxic-metabolic causes. There is also severe diffuse encephalopathy, nonspecific etiology. No seizures were seen during this study.   Shardea Cwynar Barbra Sarks

## 2020-12-22 ENCOUNTER — Inpatient Hospital Stay (HOSPITAL_COMMUNITY): Payer: Medicare HMO

## 2020-12-22 DIAGNOSIS — J9602 Acute respiratory failure with hypercapnia: Secondary | ICD-10-CM | POA: Diagnosis not present

## 2020-12-22 DIAGNOSIS — G40901 Epilepsy, unspecified, not intractable, with status epilepticus: Secondary | ICD-10-CM | POA: Diagnosis not present

## 2020-12-22 LAB — GLUCOSE, CAPILLARY
Glucose-Capillary: 120 mg/dL — ABNORMAL HIGH (ref 70–99)
Glucose-Capillary: 130 mg/dL — ABNORMAL HIGH (ref 70–99)
Glucose-Capillary: 115 mg/dL — ABNORMAL HIGH (ref 70–99)
Glucose-Capillary: 99 mg/dL (ref 70–99)
Glucose-Capillary: 117 mg/dL — ABNORMAL HIGH (ref 70–99)
Glucose-Capillary: 136 mg/dL — ABNORMAL HIGH (ref 70–99)

## 2020-12-22 LAB — COMPREHENSIVE METABOLIC PANEL
ALT: 38 U/L (ref 0–44)
AST: 73 U/L — ABNORMAL HIGH (ref 15–41)
Albumin: 1.6 g/dL — ABNORMAL LOW (ref 3.5–5.0)
Alkaline Phosphatase: 95 U/L (ref 38–126)
Anion gap: 7 (ref 5–15)
BUN: 27 mg/dL — ABNORMAL HIGH (ref 6–20)
CO2: 24 mmol/L (ref 22–32)
Calcium: 7.6 mg/dL — ABNORMAL LOW (ref 8.9–10.3)
Chloride: 111 mmol/L (ref 98–111)
Creatinine, Ser: 1.35 mg/dL — ABNORMAL HIGH (ref 0.61–1.24)
GFR, Estimated: >60 mL/min (ref >60)
Glucose, Bld: 182 mg/dL — ABNORMAL HIGH (ref 70–99)
Potassium: 4.1 mmol/L (ref 3.5–5.1)
Sodium: 142 mmol/L (ref 135–145)
Total Bilirubin: 1 mg/dL (ref 0.3–1.2)
Total Protein: 4.9 g/dL — ABNORMAL LOW (ref 6.5–8.1)

## 2020-12-22 LAB — BLOOD GAS, ARTERIAL
Acid-Base Excess: 1.1 mmol/L (ref 0.0–2.0)
Bicarbonate: 24.5 mmol/L (ref 20.0–28.0)
FIO2: 40
O2 Saturation: 94.6 %
Patient temperature: 38.4
pCO2 arterial: 37.3 mmHg (ref 32.0–48.0)
pH, Arterial: 7.44 (ref 7.350–7.450)
pO2, Arterial: 78.6 mmHg — ABNORMAL LOW (ref 83.0–108.0)

## 2020-12-22 LAB — CBC
HCT: 32.1 % — ABNORMAL LOW (ref 39.0–52.0)
Hemoglobin: 10.8 g/dL — ABNORMAL LOW (ref 13.0–17.0)
MCH: 28.4 pg (ref 26.0–34.0)
MCHC: 33.6 g/dL (ref 30.0–36.0)
MCV: 84.5 fL (ref 80.0–100.0)
Platelets: 198 10*3/uL (ref 150–400)
RBC: 3.8 MIL/uL — ABNORMAL LOW (ref 4.22–5.81)
RDW: 15.2 % (ref 11.5–15.5)
WBC: 19.5 10*3/uL — ABNORMAL HIGH (ref 4.0–10.5)
nRBC: 0.2 % (ref 0.0–0.2)

## 2020-12-22 LAB — RAPID URINE DRUG SCREEN, HOSP PERFORMED
Amphetamines: NOT DETECTED
Barbiturates: POSITIVE — AB
Benzodiazepines: POSITIVE — AB
Cocaine: POSITIVE — AB
Opiates: NOT DETECTED
Tetrahydrocannabinol: POSITIVE — AB

## 2020-12-22 LAB — PHENOBARBITAL LEVEL: Phenobarbital: 23.7 ug/mL (ref 15.0–30.0)

## 2020-12-22 IMAGING — MR MR HEAD WO/W CM
18 of 20 series · 42 of 48 positions shown · IV contrast (gadavist)
Comparison: CT head [DATE]

CLINICAL DATA: Seizure

EXAM:
MRI HEAD WITHOUT AND WITH CONTRAST
TECHNIQUE: Multiplanar, multiecho pulse sequences of the brain and surrounding
structures were obtained without and with intravenous contrast.
CONTRAST:  8mL GADAVIST GADOBUTROL 1 MMOL/ML IV SOLN

[Series 5: DWI · axial · 3.0mm · 0.96mm/px · z∈[-107,+50]mm · 5 of 112 slices shown (1 of 4)]
[im 1/112]
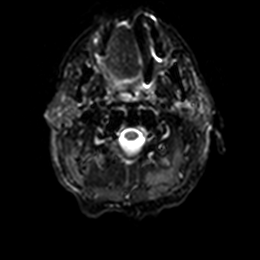
[im 28/112]
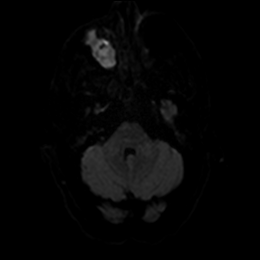
[im 56/112]
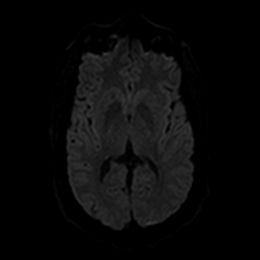
[im 84/112]
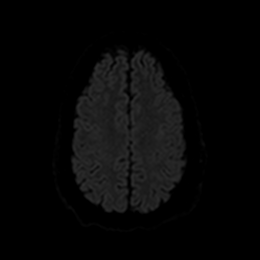
[im 112/112]
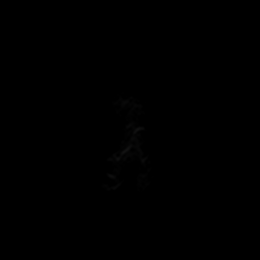

[Series 6: DWI · axial · 3.0mm · 0.96mm/px · z∈[-107,+50]mm · 3 of 55 slices shown (2 of 4)]
[im 1/55]
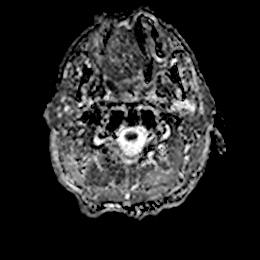
[im 28/55]
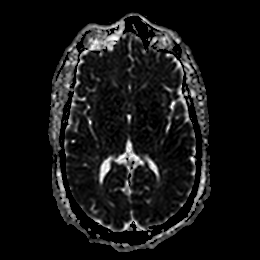
[im 55/55]
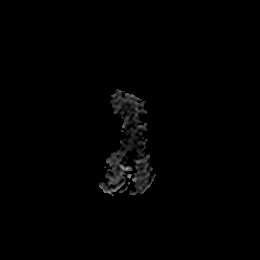

[Series 7: DWI · coronal · 4.0mm · 0.92mm/px · 3 of 84 slices shown (3 of 4)]
[im 1/84]
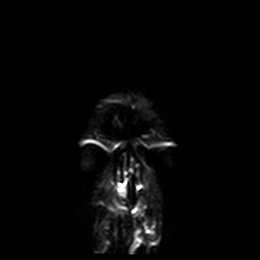
[im 42/84]
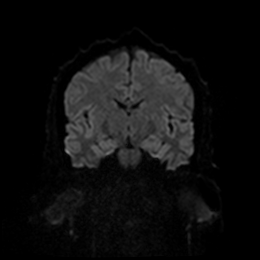
[im 84/84]
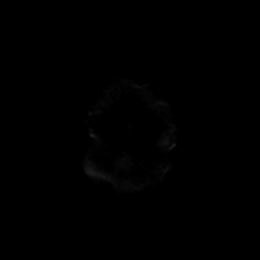

[Series 8: DWI · coronal · 4.0mm · 0.92mm/px · 2 of 42 slices shown (4 of 4)]
[im 1/42]
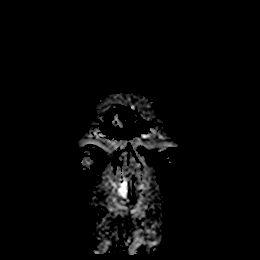
[im 42/42]
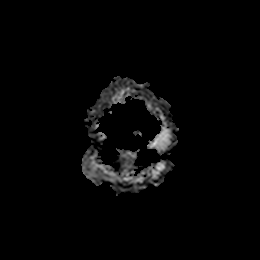

[Series 9: T1 · sagittal · 5.0mm · 0.81mm/px · 1 of 23 slices shown]
[im 1/23]
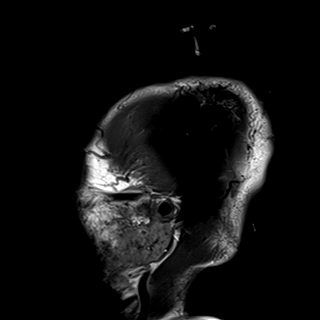

[Series 10: T2 · axial · 5.0mm · 0.78mm/px · 1 of 28 slices shown (1 of 2)]
[im 1/28]
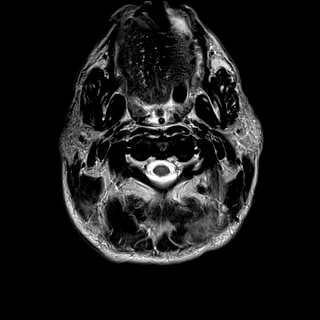

[Series 11: FLAIR · axial · 5.0mm · 0.45mm/px · 1 of 28 slices shown (1 of 2)]
[im 1/28]
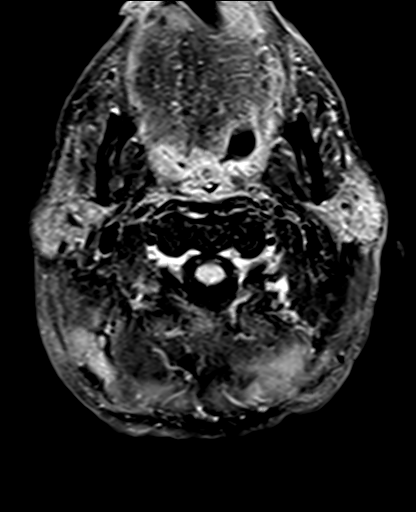

[Series 12: mag_images · axial · 3.0mm · 0.98mm/px · z∈[-107,+62]mm · 2 of 60 slices shown]
[im 1/60]
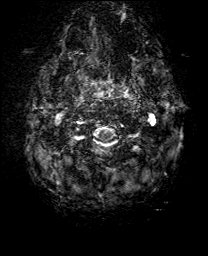
[im 60/60]
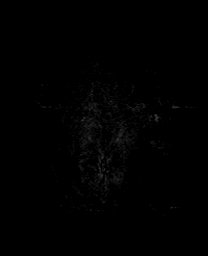

[Series 13: pha_images · axial · 3.0mm · 0.98mm/px · z∈[-107,+62]mm · 2 of 60 slices shown]
[im 1/60]
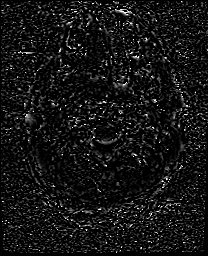
[im 60/60]
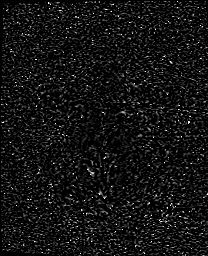

[Series 14: swi_images · axial · 3.0mm · 0.98mm/px · z∈[-107,+62]mm · 2 of 60 slices shown]
[im 1/60]
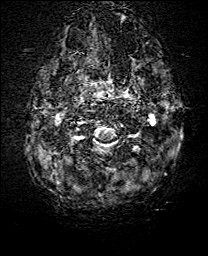
[im 60/60]
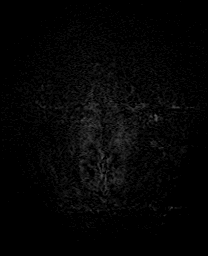

[Series 15: mip_images(sw) · axial · 24.0mm · 0.98mm/px · z∈[-97,+52]mm · 2 of 53 slices shown]
[im 1/53]
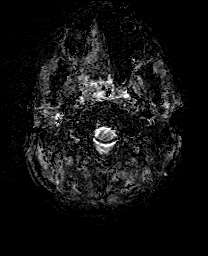
[im 53/53]
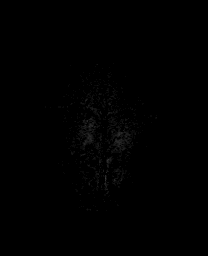

[Series 17: T2 · coronal · 3.0mm · 0.30mm/px · 2 of 38 slices shown (2 of 2)]
[im 1/38]
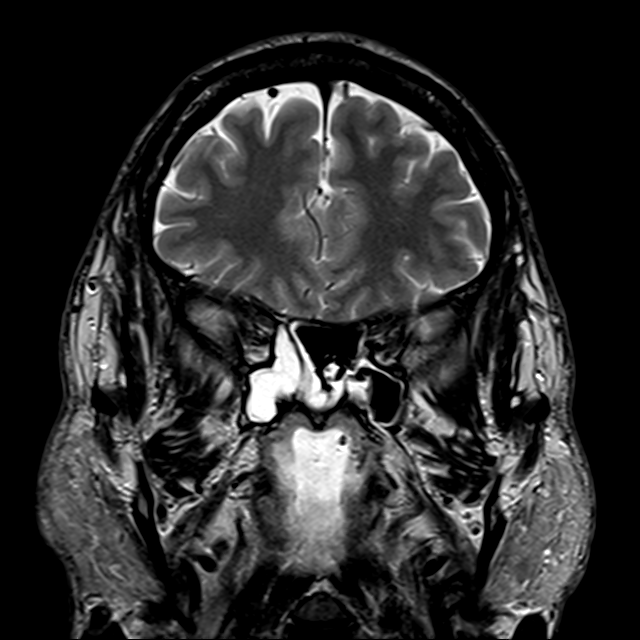
[im 38/38]
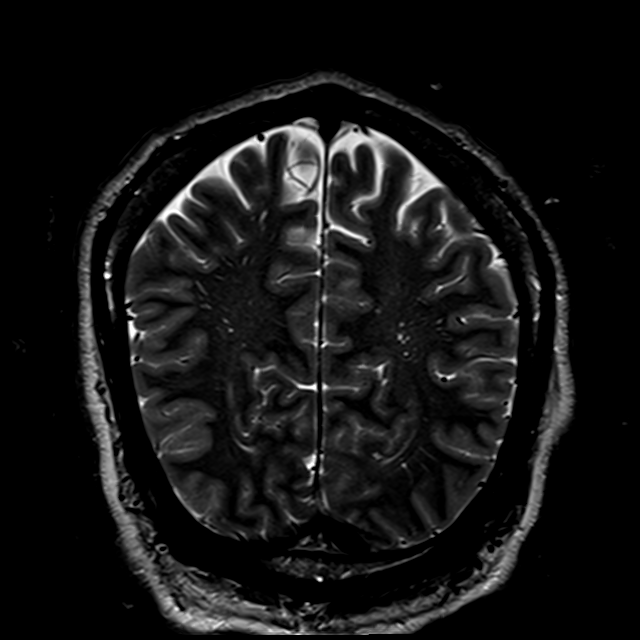

[Series 18: FLAIR · coronal · 3.0mm · 0.59mm/px · 2 of 38 slices shown (2 of 2)]
[im 1/38]
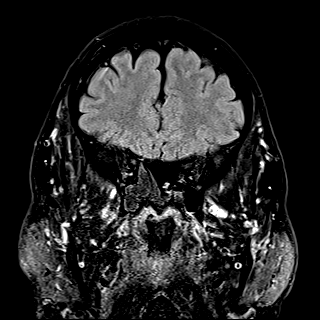
[im 38/38]
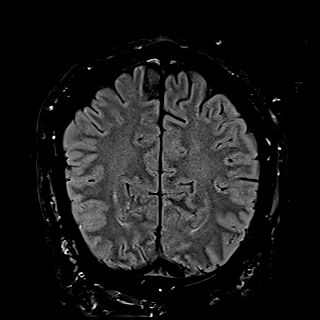

[Series 19: t1_mprage_tra_p2_iso · axial · 1.0mm · 0.98mm/px · z∈[-111,+57]mm · 7 of 176 slices shown]
[im 1/176]
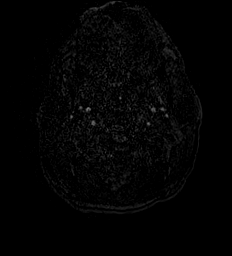
[im 30/176]
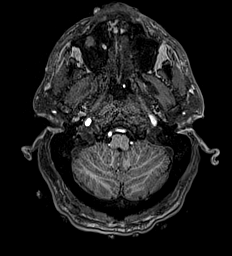
[im 59/176]
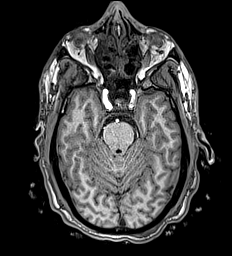
[im 88/176]
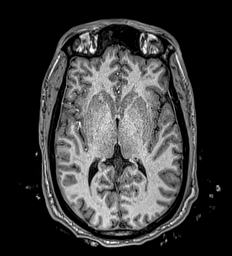
[im 117/176]
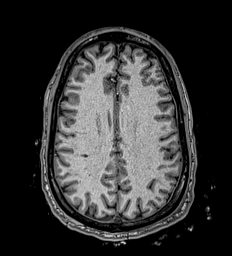
[im 146/176]
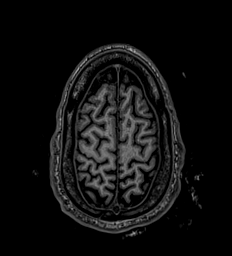
[im 176/176]
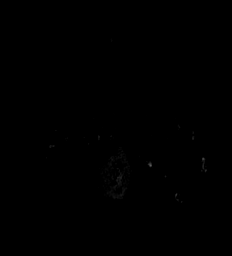

[Series 20: t1_mprage_tra_p2_iso_mpr_coronal · coronal · 1.0mm · 0.45mm/px · 4 of 150 slices shown]
[im 1/150]
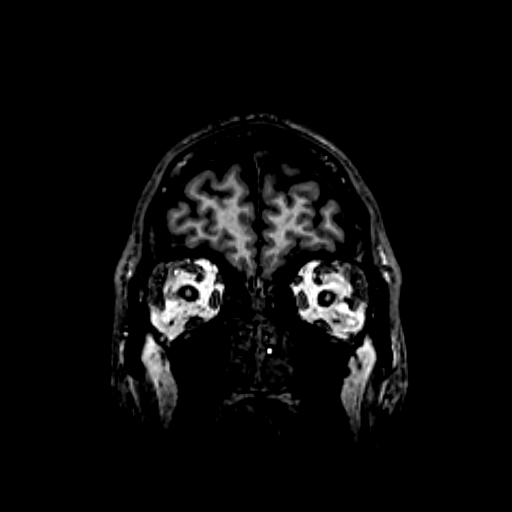
[im 30/150]
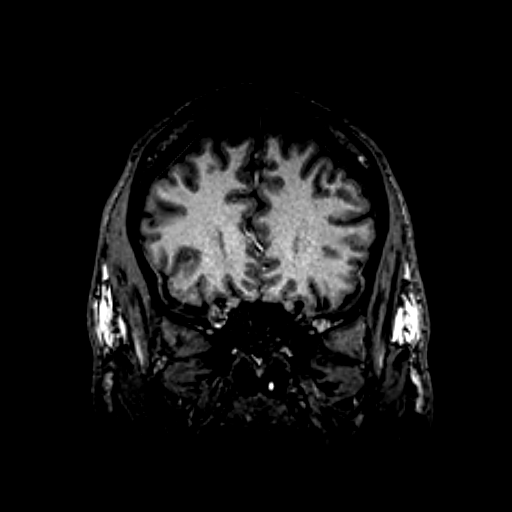
[im 60/150]
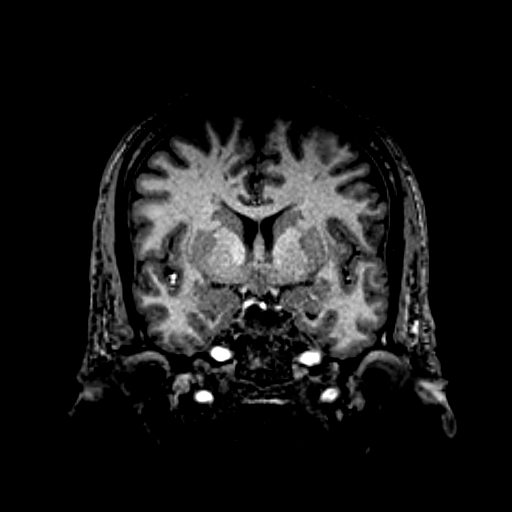
[im 90/150]
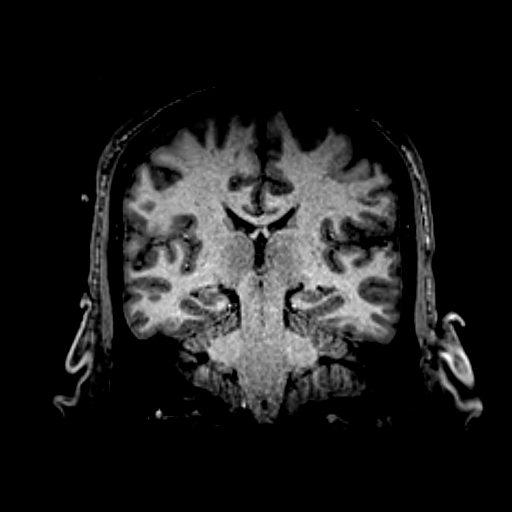

[Series 21: T2 post-contrast · coronal · 5.0mm · 0.72mm/px · 1 of 28 slices shown]
[im 1/28]
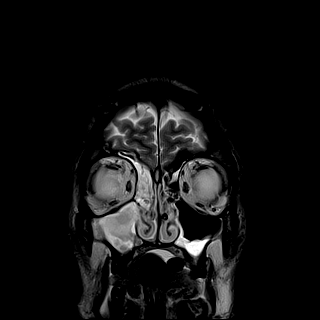

[Series 23: T1 post-contrast · coronal · 5.0mm · 0.34mm/px · 1 of 28 slices shown (1 of 2)]
[im 1/28]
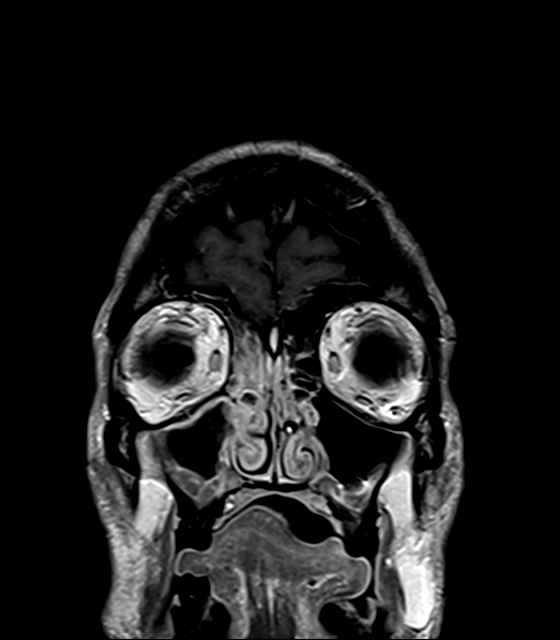

[Series 24: T1 post-contrast · sagittal · 5.0mm · 0.72mm/px · 1 of 23 slices shown (2 of 2)]
[im 1/23]
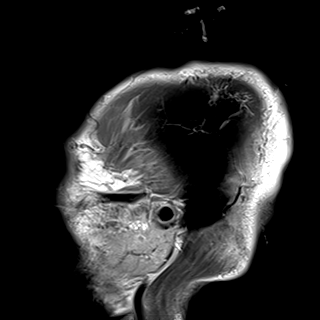

[42 of 48 positions shown; findings below may reference images not displayed]

FINDINGS: Brain: There is no evidence of acute intracranial hemorrhage,
extra-axial fluid collection, or infarct. There is no parenchymal
signal abnormality. The ventricles are not enlarged. There is no
midline shift. No mass lesion is identified. There is no abnormal
enhancement.

The hippocampal formations are normal and symmetric. There is no
structural or migrational abnormality.

Vascular: Normal flow voids.

Skull and upper cervical spine: Normal marrow signal.

Sinuses/Orbits: There is complete opacification of the right
maxillary sinus with mucosal thickening throughout the remainder of
the paranasal sinuses. The globes and orbits are unremarkable.

Other: Endotracheal and enteric catheters are noted. There are
bilateral mastoid effusions likely related to instrumentation.
IMPRESSION: No acute intracranial pathology or epileptogenic focus identified.

## 2020-12-22 MED ORDER — GADOBUTROL 1 MMOL/ML IV SOLN
8.0000 mL | Freq: Once | INTRAVENOUS | Status: AC | PRN
  Administered 2020-12-22: 8 mL via INTRAVENOUS

## 2020-12-22 NOTE — Progress Notes (Signed)
LTM maint complete - no skin breakdown under: Fp1 Fp2 F3 F4  Maintenance Cz P3 P8 F4 F7 T7 Fp1 Fp2 [facility] monitored, Event button test confirmed by [facility].

## 2020-12-22 NOTE — Progress Notes (Signed)
Called to see if pt is ready for MRI. RN unavailable at the moment will call back to let me know when to come up for unhook.

## 2020-12-22 NOTE — Procedures (Addendum)
Patient Name: Luis Hunter  MRN: [id number]  Epilepsy Attending: Lora Havens  Referring Physician/Provider: Dr Amie [place] Duration: 12/21/2020 1803 to 12/22/2020 1249, 12/22/2020 1926- 12/23/2020 0030   Patient history: 46 year old with status epilepticus-has a history of bipolar disorder, depression, schizophrenia polysubstance abuse and seizures with noncompliance to medications. EEG to evaluate for seizure   Level of alertness: awake, asleep   AEDs during EEG study: LEV, PHT, phenobarb, Vimpat   Technical aspects: This EEG study was done with scalp electrodes positioned according to the 10-20 International system of electrode placement. Electrical activity was acquired at a sampling rate of '500Hz'$  and reviewed with a high frequency filter of '70Hz'$  and a low frequency filter of '1Hz'$ . EEG data were recorded continuously and digitally stored.    Description: No clear posterior dominant rhythm was seen.  Sleep was characterized by sleep spindles (12 to 14 Hz), maximum frontocentral region. EEG showed continuous generalized 3-5 Hz theta-delta slowing as well as generalized periodic discharges with triphasic morphology at 1 Hz.   ABNORMALITY -Periodic discharges with triphasic morphology, generalized -Continuous slow, generalized   IMPRESSION: This study showed periodic discharges with triphasic morphology at 1 Hz which is on the ictal-interictal continuum with low potential for seizures.  Of note, this EEG pattern can also be seen with toxic-metabolic causes. There is also severe diffuse encephalopathy, nonspecific etiology. No seizures were seen during this study.  EEG appears similar to previous day.   Daris Harkins Barbra Sarks

## 2020-12-22 NOTE — Progress Notes (Signed)
Pt transported to MRI and back to 2M09 without any complications.

## 2020-12-22 NOTE — Progress Notes (Signed)
LTM EEG re hook; up and running - no initial skin breakdown - push button tested - neuro notified. [facility] monitored, Event button test confirmed by [facility].

## 2020-12-22 NOTE — Progress Notes (Signed)
NAME:  Luis Hunter, MRN:  [id number], DOB:  December 21, 1975, LOS: 6 ADMISSION DATE:  12/16/2020, CONSULTATION DATE:  12/22/2020  REFERRING MD:  Starleen Blue, EDP [facility], CHIEF COMPLAINT:  seizures, intubated   History of Present Illness:  History is obtained from chart review since patient is currently intubated and sedated.  45 year old man with prior history of seizures and substance abuse found on the floor unresponsive by his wife.  Multiple witnessed seizures by wife and EMS, received 8 mg of IV Versed in the field and continued to seize on arrival to the ED, received 4 mg of IV Ativan, intubated for airway protection and started on propofol drip.  Loaded with Keppra.  Versed drip was added since he continued to have twitching on maximum dose of propofol.  Head CT was negative.  UDS positive for cocaine and THC  Transferred to [facility] for continuous EEG monitoring  Pertinent  Medical History  Schizophrenia Substance abuse, cocaine Seizure disorder Bipolar and depression  Significant Hospital Events: Including procedures, antibiotic start and stop dates in addition to other pertinent events   8/13 intubated, transferred to [facility] 8/16 respiratory aspirate with gram positive cocci, few gram negative rods 8/16 started on unasyn 8/17 unasyn discontinued 8/18 started on zosyn  Interim History / Subjective:  Patient remains off Versed and propofol infusion Continues to spike fever with T-max 102.3 He is off vasopressors and his FiO2 requirement is down to 40%  Objective   Blood pressure 140/87, pulse (!) 114, temperature (!) 101.1 F (38.4 C), temperature source Oral, resp. rate (!) 30, height '5\' 11"'$  (1.803 m), weight 86.8 kg, SpO2 96 %.    Vent Mode: PRVC FiO2 (%):  [40 %-70 %] 40 % Set Rate:  [30 bmp] 30 bmp Vt Set:  [600 mL] 600 mL PEEP:  [10 [id number] cmH20] 10 cmH20 Plateau Pressure:  [21 cmH20-25 cmH20] 21 cmH20   Intake/Output Summary (Last 24 hours) at 12/22/2020 1113 Last data filed  at 12/22/2020 1000 Gross per 24 hour  Intake 2404.84 ml  Output 1225 ml  Net 1179.84 ml   Filed Weights   12/19/20 0400 12/20/20 0102 12/21/20 0500  Weight: 81.5 kg 83.2 kg 86.8 kg    Examination:   Physical exam: General: Crtitically ill-appearing male, orally intubated HEENT: [place]/AT, eyes anicteric.  ETT and OGT in place Neuro: Sedated, not following commands.  Eyes are closed.  Pupils 3 mm bilateral reactive to light.  Weak cough, gag positive Chest: Reduced air entry at the bases bilaterally,, no wheezes or rhonchi Heart: Regular rate and rhythm, no murmurs or gallops Abdomen: Soft, nontender, nondistended, bowel sounds present Skin: No rash   Resolved Hospital Problem list    Hypokalemia Hypophosphatemia  Assessment & Plan:  Acute hypoxic/hypercapnic respiratory failure ARDS Aspiration pneumonia due to Serratia Septic shock due to aspiration pneumonia, improving Continue lung protective ventilation VAP bundle Peak and plateau pressure cardiac: Elevate head end of the bed Aspiration precautions Overall FiO2 requirement is down now he is on 40% with PEEP of 10 He did not require repeat proning based on his P/F ratio Respiratory culture is growing Serratia Continue Zosyn for now  Status epilepticus Polysubstance abuse Patient with seizure disorder, he went into status epilepticus due to noncompliance of medication in the setting of polysubstance abuse He is on multiple AEDs Now Versed and propofol infusions are off LTM showed no more seizures He will have MRI brain with and without contrast Neurology is following  AKI due to his sepsis  Acute urinary retention Serum creatinine is improving Monitor intake and output Holding off on diuretics  Continue bethanechol Voiding trial by tomorrow Avoid nephrotoxic agents  Toxic encephalopathy Patient urine tox was positive for cannabis and cocaine Minimize sedation with RASS goal 0/-1  Chronic systolic congestive  heart failure Patient ejection fraction is 40% likely nonischemic due to drug abuse Monitor intake and output  Best Practice (right click and "Reselect all SmartList Selections" daily)   Diet/type: tubefeeds DVT prophylaxis: LMWH GI prophylaxis: PPI Lines: N/A Foley: Yes, voiding trial tomorrow Code Status:  full code Last date of multidisciplinary goals of care discussion 8/18  Patient's sister was updated at bedside.  Continue full aggressive care  Labs   CBC: Recent Labs  Lab 12/16/20 1118 12/16/20 1846 12/17/20 0503 12/19/20 0542 12/19/20 2046 12/20/20 0448 12/20/20 1058 12/21/20 0440 12/21/20 1101 12/21/20 1507 12/22/20 0351  WBC 12.1*  --  13.8* 12.1*  --  10.8*  --  17.7*  --   --  19.5*  NEUTROABS 9.6*  --   --   --   --   --   --  15.7*  --   --   --   HGB 13.7   < > 12.2* 11.4*   < > 11.1* 11.2* 11.1* 10.5* 10.2* 10.8*  HCT 41.0   < > 35.8* 35.1*   < > 34.5* 33.0* 32.5* 31.0* 30.0* 32.1*  MCV 90.5  --  88.8 90.9  --  90.6  --  86.2  --   --  84.5  PLT 214  --  215 204  --  206  --  222  --   --  198   < > = values in this interval not displayed.    Basic Metabolic Panel: Recent Labs  Lab 12/18/20 0551 12/18/20 1856 12/19/20 0542 12/19/20 2046 12/20/20 0448 12/20/20 1058 12/21/20 0440 12/21/20 1101 12/21/20 1507 12/22/20 0351  NA 142  --  142   < > 145 142 139 141 141 142  K 4.2  --  4.3   < > 3.7 3.8 3.8 3.8 3.6 4.1  CL 115*  --  114*  --  114*  --  110  --   --  111  CO2 22  --  20*  --  22  --  21*  --   --  24  GLUCOSE 106*  --  136*  --  123*  --  167*  --   --  182*  BUN 15  --  24*  --  29*  --  32*  --   --  27*  CREATININE 1.60*  --  1.41*  --  1.56*  --  1.70*  --   --  1.35*  CALCIUM 8.0*  --  7.6*  --  7.6*  --  7.5*  --   --  7.6*  MG 2.1 1.7 1.6*  --  1.9  --  2.4  --   --   --   PHOS 1.8* 4.3 4.1  --  2.7  --  4.3  --   --   --    < > = values in this interval not displayed.   GFR: Estimated Creatinine Clearance: 72.8 mL/min (A)  (by C-G formula based on SCr of 1.35 mg/dL (H)). Recent Labs  Lab 12/16/20 1118 12/16/20 1454 12/17/20 0503 12/19/20 0542 12/20/20 0448 12/21/20 0440 12/22/20 0351  WBC 12.1*  --    < > 12.1* 10.8*  17.7* 19.5*  LATICACIDVEN 6.0* 2.7*  --   --   --   --   --    < > = values in this interval not displayed.    Liver Function Tests: Recent Labs  Lab 12/16/20 1118 12/18/20 0551 12/20/20 0448 12/21/20 0440 12/22/20 0351  AST 30  --  37 64* 73*  ALT 18  --  21 30 38  ALKPHOS 51  --  46 67 95  BILITOT 0.9  --  0.9 0.9 1.0  PROT 6.5  --  4.8* 4.8* 4.9*  ALBUMIN 3.6 2.7* 2.0* 1.7* 1.6*   No results for input(s): LIPASE, AMYLASE in the last 168 hours. No results for input(s): AMMONIA in the last 168 hours.  ABG    Component Value Date/Time   PHART 7.440 12/22/2020 1014   PCO2ART 37.3 12/22/2020 1014   PO2ART 78.6 (L) 12/22/2020 1014   HCO3 24.5 12/22/2020 1014   TCO2 24 12/21/2020 1507   ACIDBASEDEF 1.0 12/21/2020 1101   O2SAT 94.6 12/22/2020 1014     Coagulation Profile: No results for input(s): INR, PROTIME in the last 168 hours.  Cardiac Enzymes: Recent Labs  Lab 12/18/20 1320  CKTOTAL 561*    HbA1C: Hgb A1c MFr Bld  Date/Time Value Ref Range Status  11/20/2019 12:50 AM 5.8 (H) 4.8 - 5.6 % Final    Comment:    (NOTE) Pre diabetes:          5.7%-6.4%  Diabetes:              >6.4%  Glycemic control for   <7.0% adults with diabetes     CBG: Recent Labs  Lab 12/21/20 1520 12/21/20 1937 12/21/20 2329 12/22/20 0331 12/22/20 0719  GLUCAP 134* 145* 132* 120* 130*       Total critical care time: 37 minutes  Performed by: [place] care time was exclusive of separately billable procedures and treating other patients.   Critical care was necessary to treat or prevent imminent or life-threatening deterioration.   Critical care was time spent personally by me on the following activities: development of treatment plan with patient  and/or surrogate as well as nursing, discussions with consultants, evaluation of patient's response to treatment, examination of patient, obtaining history from patient or surrogate, ordering and performing treatments and interventions, ordering and review of laboratory studies, ordering and review of radiographic studies, pulse oximetry and re-evaluation of patient's condition.   Jacky Kindle MD [facility] See Amion for pager If no response to pager, please call [phone] until 7pm After 7pm, Please call E-link [phone]

## 2020-12-22 NOTE — Progress Notes (Signed)
Subjective: NAEO. No clinical seizures  ROS: Unable to obtain due to poor mental status  Examination  Vital signs in last 24 hours: Temp:  [99.4 F (37.4 C)-102.1 F (38.9 C)] 101.1 F (38.4 C) (08/19 1013) Pulse Rate:  [51-103] 98 (08/19 0729) Resp:  [21-30] 30 (08/19 0729) BP: (106-140)/(59-83) 106/59 (08/19 0729) SpO2:  [91 %-100 %] 96 % (08/19 0729) Arterial Line BP: (99-157)/(50-76) 104/59 (08/19 0700) FiO2 (%):  [40 %-70 %] 40 % (08/19 0729)  General: lying in bed, NAD CVS: pulse-normal rate and rhythm RS: intubated, CTAB Extremities: normal, cool  Neuro: comatose, does not open eyes to noxious stimuli, PERRLA, corneal reflex present, gag reflex present, does not withdraw to noxious stimuli in all extremities except subtle withdrawal in LLE  Basic Metabolic Panel: Recent Labs  Lab 12/18/20 0551 12/18/20 1856 12/19/20 0542 12/19/20 2046 12/20/20 0448 12/20/20 1058 12/21/20 0440 12/21/20 1101 12/21/20 1507 12/22/20 0351  NA 142  --  142   < > 145 142 139 141 141 142  K 4.2  --  4.3   < > 3.7 3.8 3.8 3.8 3.6 4.1  CL 115*  --  114*  --  114*  --  110  --   --  111  CO2 22  --  20*  --  22  --  21*  --   --  24  GLUCOSE 106*  --  136*  --  123*  --  167*  --   --  182*  BUN 15  --  24*  --  29*  --  32*  --   --  27*  CREATININE 1.60*  --  1.41*  --  1.56*  --  1.70*  --   --  1.35*  CALCIUM 8.0*  --  7.6*  --  7.6*  --  7.5*  --   --  7.6*  MG 2.1 1.7 1.6*  --  1.9  --  2.4  --   --   --   PHOS 1.8* 4.3 4.1  --  2.7  --  4.3  --   --   --    < > = values in this interval not displayed.    CBC: Recent Labs  Lab 12/16/20 1118 12/16/20 1846 12/17/20 0503 12/19/20 0542 12/19/20 2046 12/20/20 0448 12/20/20 1058 12/21/20 0440 12/21/20 1101 12/21/20 1507 12/22/20 0351  WBC 12.1*  --  13.8* 12.1*  --  10.8*  --  17.7*  --   --  19.5*  NEUTROABS 9.6*  --   --   --   --   --   --  15.7*  --   --   --   HGB 13.7   < > 12.2* 11.4*   < > 11.1* 11.2* 11.1* 10.5*  10.2* 10.8*  HCT 41.0   < > 35.8* 35.1*   < > 34.5* 33.0* 32.5* 31.0* 30.0* 32.1*  MCV 90.5  --  88.8 90.9  --  90.6  --  86.2  --   --  84.5  PLT 214  --  215 204  --  206  --  222  --   --  198   < > = values in this interval not displayed.     Coagulation Studies: No results for input(s): LABPROT, INR in the last 72 hours.  Imaging No new brain imaging overnight   ASSESSMENT AND PLAN:  45 year old male with history of epilepsy who presented with status  epilepticus in the setting of medication noncompliance.   Status epilepticus (resolved) Epilepsy with breakthrough seizure Acute encephalopathy, postictal and medication induced -LTM EEG showed periodic discharges with triphasic morphology at 1 Hz which is on the ictal-interictal continuum with low potential for seizures.  Of note, this EEG pattern can also be seen with toxic-metabolic causes. There is also severe diffuse encephalopathy, nonspecific etiology. No seizures were seen during this study.   Recommendations - Will obtain MRI brain w and wo contrast today to look for acute abnormalities - Continue VEEG as patient still comatose. If no seizures, can consider reducing AEDs - Will also check UDS to assess is sedation is still lingering -Continue Keppra to 1500 mg twice daily, Vimpat 200 mg twice daily, phenobarbital 65 mg twice daily and phenytoin 100 mg every 8 hours -Continue seizure precautions -As needed IV Ativan 2 mg for clinical seizure-like activity -Management of rest of comorbidities per primary team   I have spent a total of 35  minutes with the patient reviewing hospital notes,  test results, labs and examining the patient as well as establishing an assessment and plan.  > 50% of time was spent in direct patient care.  Zeb Comfort Epilepsy [facility] For questions after 5pm please refer to AMION to reach the Neurologist on call

## 2020-12-22 NOTE — Progress Notes (Signed)
LTM maintenance completed; no skin breakdown was seen, reglued P3, C3, F4, and A1. Checked Fp1 and Fp2. Tested event button.

## 2020-12-23 DIAGNOSIS — J690 Pneumonitis due to inhalation of food and vomit: Secondary | ICD-10-CM

## 2020-12-23 DIAGNOSIS — G40901 Epilepsy, unspecified, not intractable, with status epilepticus: Secondary | ICD-10-CM | POA: Diagnosis not present

## 2020-12-23 DIAGNOSIS — J9601 Acute respiratory failure with hypoxia: Secondary | ICD-10-CM | POA: Diagnosis not present

## 2020-12-23 LAB — COMPREHENSIVE METABOLIC PANEL
ALT: 45 U/L — ABNORMAL HIGH (ref 0–44)
AST: 68 U/L — ABNORMAL HIGH (ref 15–41)
Albumin: 1.6 g/dL — ABNORMAL LOW (ref 3.5–5.0)
Alkaline Phosphatase: 104 U/L (ref 38–126)
Anion gap: 6 (ref 5–15)
BUN: 27 mg/dL — ABNORMAL HIGH (ref 6–20)
CO2: 25 mmol/L (ref 22–32)
Calcium: 7.6 mg/dL — ABNORMAL LOW (ref 8.9–10.3)
Chloride: 109 mmol/L (ref 98–111)
Creatinine, Ser: 1.22 mg/dL (ref 0.61–1.24)
GFR, Estimated: >60 mL/min (ref >60)
Glucose, Bld: 141 mg/dL — ABNORMAL HIGH (ref 70–99)
Potassium: 4.2 mmol/L (ref 3.5–5.1)
Sodium: 140 mmol/L (ref 135–145)
Total Bilirubin: 1 mg/dL (ref 0.3–1.2)
Total Protein: 5.3 g/dL — ABNORMAL LOW (ref 6.5–8.1)

## 2020-12-23 LAB — CBC WITH DIFFERENTIAL/PLATELET
Abs Immature Granulocytes: 1.15 10*3/uL — ABNORMAL HIGH (ref 0.00–0.07)
Basophils Absolute: 0.1 10*3/uL (ref 0.0–0.1)
Basophils Relative: 1 %
Eosinophils Absolute: 0.2 10*3/uL (ref 0.0–0.5)
Eosinophils Relative: 1 %
HCT: 29.4 % — ABNORMAL LOW (ref 39.0–52.0)
Hemoglobin: 10.2 g/dL — ABNORMAL LOW (ref 13.0–17.0)
Immature Granulocytes: 8 %
Lymphocytes Relative: 6 %
Lymphs Abs: 0.9 10*3/uL (ref 0.7–4.0)
MCH: 29.6 pg (ref 26.0–34.0)
MCHC: 34.7 g/dL (ref 30.0–36.0)
MCV: 85.2 fL (ref 80.0–100.0)
Monocytes Absolute: 1.6 10*3/uL — ABNORMAL HIGH (ref 0.1–1.0)
Monocytes Relative: 10 %
Neutro Abs: 11.5 10*3/uL — ABNORMAL HIGH (ref 1.7–7.7)
Neutrophils Relative %: 74 %
Platelets: 178 10*3/uL (ref 150–400)
RBC: 3.45 MIL/uL — ABNORMAL LOW (ref 4.22–5.81)
RDW: 15.2 % (ref 11.5–15.5)
WBC: 15.4 10*3/uL — ABNORMAL HIGH (ref 4.0–10.5)
nRBC: 0.3 % — ABNORMAL HIGH (ref 0.0–0.2)

## 2020-12-23 LAB — GLUCOSE, CAPILLARY
Glucose-Capillary: 102 mg/dL — ABNORMAL HIGH (ref 70–99)
Glucose-Capillary: 146 mg/dL — ABNORMAL HIGH (ref 70–99)
Glucose-Capillary: 116 mg/dL — ABNORMAL HIGH (ref 70–99)
Glucose-Capillary: 126 mg/dL — ABNORMAL HIGH (ref 70–99)
Glucose-Capillary: 114 mg/dL — ABNORMAL HIGH (ref 70–99)
Glucose-Capillary: 114 mg/dL — ABNORMAL HIGH (ref 70–99)

## 2020-12-23 LAB — PHENOBARBITAL LEVEL: Phenobarbital: 25.1 ug/mL (ref 15.0–30.0)

## 2020-12-23 LAB — PHENYTOIN LEVEL, TOTAL: Phenytoin Lvl: 10.2 ug/mL (ref 10.0–20.0)

## 2020-12-23 MED ORDER — FENTANYL CITRATE (PF) 100 MCG/2ML IJ SOLN
50.0000 ug | INTRAMUSCULAR | Status: DC | PRN
  Administered 2020-12-23 – 2020-12-26 (×6): 50 ug via INTRAVENOUS
  Filled 2020-12-23 (×7): qty 2

## 2020-12-23 MED ORDER — CEFTRIAXONE SODIUM 2 G IJ SOLR
2.0000 g | INTRAMUSCULAR | Status: DC
Start: 2020-12-23 — End: 2020-12-24
  Administered 2020-12-23: 2 g via INTRAVENOUS
  Filled 2020-12-23: qty 20

## 2020-12-23 MED ORDER — GLYCOPYRROLATE 0.2 MG/ML IJ SOLN
0.1000 mg | Freq: Three times a day (TID) | INTRAMUSCULAR | Status: DC | PRN
  Administered 2020-12-24 – 2020-12-25 (×2): 0.1 mg via INTRAVENOUS
  Filled 2020-12-23 (×2): qty 1

## 2020-12-23 MED ORDER — DEXMEDETOMIDINE HCL IN NACL 400 MCG/100ML IV SOLN
0.0000 ug/kg/h | INTRAVENOUS | Status: AC
  Administered 2020-12-23 (×2): 0.5 ug/kg/h via INTRAVENOUS
  Administered 2020-12-24 (×2): 0.4 ug/kg/h via INTRAVENOUS
  Administered 2020-12-24 – 2020-12-25 (×2): 0.8 ug/kg/h via INTRAVENOUS
  Administered 2020-12-25: 0.5 ug/kg/h via INTRAVENOUS
  Filled 2020-12-23 (×7): qty 100

## 2020-12-23 MED ORDER — METOPROLOL TARTRATE 25 MG PO TABS
25.0000 mg | ORAL_TABLET | Freq: Two times a day (BID) | ORAL | Status: DC
  Administered 2020-12-23 – 2020-12-26 (×7): 25 mg
  Filled 2020-12-23 (×7): qty 1

## 2020-12-23 NOTE — Progress Notes (Signed)
NAME:  Luis Hunter, MRN:  [id number], DOB:  01/11/1976, LOS: 7 ADMISSION DATE:  12/16/2020, CONSULTATION DATE:  12/23/2020  REFERRING MD:  Starleen Blue, EDP [facility], CHIEF COMPLAINT:  seizures, intubated   History of Present Illness:  45 yo male smoker found unresponsive by his wife.  Noted to have multiple seizures witnessed by his wife.  Intubated for airway protection and loaded with keppra.  UDS positive for cocaine and THC.  Transferred from [facility] to [facility] for continuous EEG monitoring.  Pertinent  Medical History  Schizophrenia, Seizures, Bipolar, Depression, Cocaine abuse  Significant Hospital Events:  8/13 intubated, transferred to [facility] 8/16 respiratory aspirate with gram positive cocci, few gram negative rods 8/16 started on unasyn 8/17 unasyn discontinued 8/18 started on zosyn  Interim History / Subjective:  Tm 102.1.  Remains on vent and continuous EEG.  Objective   Blood pressure (!) 140/106, pulse (!) 123, temperature 100 F (37.8 C), temperature source Oral, resp. rate (!) 30, height '5\' 11"'$  (1.803 m), weight 91.7 kg, SpO2 97 %.    Vent Mode: PRVC FiO2 (%):  [40 %] 40 % Set Rate:  [30 bmp] 30 bmp Vt Set:  [600 mL] 600 mL PEEP:  [10 cmH20] 10 cmH20 Plateau Pressure:  [23 cmH20-28 cmH20] 23 cmH20   Intake/Output Summary (Last 24 hours) at 12/23/2020 0946 Last data filed at 12/23/2020 0800 Gross per 24 hour  Intake 2091.76 ml  Output 1494 ml  Net 597.76 ml   Filed Weights   12/20/20 0102 12/21/20 0500 12/23/20 0500  Weight: 83.2 kg 86.8 kg 91.7 kg    Examination:  General - on continuous EEG, vent Eyes - pupils reactive ENT - ETT in place Cardiac - regular, tachycardic Chest - b/l crackles Abdomen - soft, non tender, + bowel sounds Extremities - no cyanosis, clubbing, or edema Skin - no rashes Neuro - withdraws to painful stimuli, not following commands  Resolved Hospital Problem list   Hypokalemia, Hypophosphatemia, AKI from ATN 2nd to sepsis  Assessment &  Plan:   Acute hypoxic/hypercapnic respiratory failure and ARDS with compromise airway in setting of Aspiration pneumonitis with Serratia in sputum culture. - f/u CXR - goal SpO2 90 to 95% - day 5 of ABx, currently on zosyn pending sensitivity results of sputum culture from 12/21/20  Status epilepticus with hx of seizure disorder. Polysubstance abuse. Hx of depression, bipolar, schizophrenia. - continuous EEG per neurology - continue keppra, vimpat, phenobarbital, phenytoin - prn ativan for seizures  Acute urinary retention. - continue bethanechol  Chronic systolic CHF, hypertension. - add lopressor  Anemia of critical illness and chronic disease. - f/u CBC - transfuse for Hb < 7  Best Practice (right click and "Reselect all SmartList Selections" daily)  Diet/type: tubefeeds DVT prophylaxis: lovenox GI prophylaxis: protonix Code Status:  full code Last date of multidisciplinary goals of care discussion 8/18   Labs    CMP Latest Ref Rng & Units 12/23/2020 12/22/2020 12/21/2020  Glucose 70 - 99 mg/dL 141(H) 182(H) -  BUN 6 - 20 mg/dL 27(H) 27(H) -  Creatinine 0.61 - 1.24 mg/dL 1.22 1.35(H) -  Sodium 135 - 145 mmol/L 140 142 141  Potassium 3.5 - 5.1 mmol/L 4.2 4.1 3.6  Chloride 98 - 111 mmol/L 109 111 -  CO2 22 - 32 mmol/L 25 24 -  Calcium 8.9 - 10.3 mg/dL 7.6(L) 7.6(L) -  Total Protein 6.5 - 8.1 g/dL 5.3(L) 4.9(L) -  Total Bilirubin 0.3 - 1.2 mg/dL 1.0 1.0 -  Alkaline  Phos 38 - 126 U/L 104 95 -  AST 15 - 41 U/L 68(H) 73(H) -  ALT 0 - 44 U/L 45(H) 38 -    CBC Latest Ref Rng & Units 12/23/2020 12/22/2020 12/21/2020  WBC 4.0 - 10.5 K/uL 15.4(H) 19.5(H) -  Hemoglobin 13.0 - 17.0 g/dL 10.2(L) 10.8(L) 10.2(L)  Hematocrit 39.0 - 52.0 % 29.4(L) 32.1(L) 30.0(L)  Platelets 150 - 400 K/uL 178 198 -    ABG    Component Value Date/Time   PHART 7.440 12/22/2020 1014   PCO2ART 37.3 12/22/2020 1014   PO2ART 78.6 (L) 12/22/2020 1014   HCO3 24.5 12/22/2020 1014   TCO2 24 12/21/2020  1507   ACIDBASEDEF 1.0 12/21/2020 1101   O2SAT 94.6 12/22/2020 1014    CBG (last 3)  Recent Labs    12/22/20 2315 12/23/20 0321 12/23/20 0742  GLUCAP 136* 102* 146*    Critical care time: 38 minutes  Chesley Mires, MD [place] Pager - [phone] 12/23/2020, 9:46 AM

## 2020-12-23 NOTE — Procedures (Addendum)
Patient Name: Luis Hunter  MRN: [id number]  Epilepsy Attending: Lora Havens  Referring Physician/Provider: Dr Amie [place] Duration: 12/23/2020 0030 to 8/21/ 0030   Patient history: 45 year old with status epilepticus-has a history of bipolar disorder, depression, schizophrenia polysubstance abuse and seizures with noncompliance to medications. EEG to evaluate for seizure   Level of alertness: awake, asleep   AEDs during EEG study: LEV, PHT, phenobarb, Vimpat   Technical aspects: This EEG study was done with scalp electrodes positioned according to the 10-20 International system of electrode placement. Electrical activity was acquired at a sampling rate of '500Hz'$  and reviewed with a high frequency filter of '70Hz'$  and a low frequency filter of '1Hz'$ . EEG data were recorded continuously and digitally stored.    Description: No clear posterior dominant rhythm was seen.  Sleep was characterized by sleep spindles (12 to 14 Hz), maximum frontocentral region. EEG showed continuous generalized 3-5 Hz theta-delta slowing as well as generalized periodic discharges with triphasic morphology at 1 Hz.   ABNORMALITY -Periodic discharges with triphasic morphology, generalized -Continuous slow, generalized   IMPRESSION: This study showed periodic discharges with triphasic morphology at 1 Hz which is on the ictal-interictal continuum with low potential for seizures.  Of note, this EEG pattern can also be seen with toxic-metabolic causes. There is also severe diffuse encephalopathy, nonspecific etiology. No seizures were seen during this study.   EEG appears similar to previous day.   Daelon Dunivan Barbra Sarks

## 2020-12-23 NOTE — Progress Notes (Signed)
RT NOTE: patient does not meet SBT criteria due to PEEP requirements.  Tolerating current ventilator settings well at this time.  Will continue to monitor.

## 2020-12-23 NOTE — Progress Notes (Signed)
eLink Physician-Brief Progress Note Patient Name: HARLAN MATHES DOB: April 10, 1976 MRN: [id number]   Date of Service  12/23/2020  HPI/Events of Note  Nursing request for AM labs.  eICU Interventions  Will order CBC with platelets and CMP at 5 AM.     Intervention Category Major Interventions: Other:  Casimer Russett Cornelia Copa 12/23/2020, 3:28 AM

## 2020-12-23 NOTE — Progress Notes (Signed)
Maintenance performed. No skin break down noted.

## 2020-12-23 NOTE — Progress Notes (Signed)
Vent dyssynchrony and increased respiratory secretions.  Add precedex and prn robinul.  Chesley Mires, MD [place] Pager - [phone] 12/23/2020, 10:46 AM

## 2020-12-24 ENCOUNTER — Inpatient Hospital Stay (HOSPITAL_COMMUNITY): Payer: Medicare HMO

## 2020-12-24 DIAGNOSIS — G40901 Epilepsy, unspecified, not intractable, with status epilepticus: Secondary | ICD-10-CM | POA: Diagnosis not present

## 2020-12-24 LAB — CBC
HCT: 28.3 % — ABNORMAL LOW (ref 39.0–52.0)
Hemoglobin: 9.7 g/dL — ABNORMAL LOW (ref 13.0–17.0)
MCH: 29.5 pg (ref 26.0–34.0)
MCHC: 34.3 g/dL (ref 30.0–36.0)
MCV: 86 fL (ref 80.0–100.0)
Platelets: 171 10*3/uL (ref 150–400)
RBC: 3.29 MIL/uL — ABNORMAL LOW (ref 4.22–5.81)
RDW: 14.9 % (ref 11.5–15.5)
WBC: 18.6 10*3/uL — ABNORMAL HIGH (ref 4.0–10.5)
nRBC: 0.2 % (ref 0.0–0.2)

## 2020-12-24 LAB — BASIC METABOLIC PANEL
Anion gap: 6 (ref 5–15)
BUN: 32 mg/dL — ABNORMAL HIGH (ref 6–20)
CO2: 27 mmol/L (ref 22–32)
Calcium: 7.9 mg/dL — ABNORMAL LOW (ref 8.9–10.3)
Chloride: 111 mmol/L (ref 98–111)
Creatinine, Ser: 1.06 mg/dL (ref 0.61–1.24)
GFR, Estimated: >60 mL/min (ref >60)
Glucose, Bld: 141 mg/dL — ABNORMAL HIGH (ref 70–99)
Potassium: 4.3 mmol/L (ref 3.5–5.1)
Sodium: 144 mmol/L (ref 135–145)

## 2020-12-24 LAB — GLUCOSE, CAPILLARY
Glucose-Capillary: 113 mg/dL — ABNORMAL HIGH (ref 70–99)
Glucose-Capillary: 123 mg/dL — ABNORMAL HIGH (ref 70–99)
Glucose-Capillary: 98 mg/dL (ref 70–99)
Glucose-Capillary: 104 mg/dL — ABNORMAL HIGH (ref 70–99)
Glucose-Capillary: 143 mg/dL — ABNORMAL HIGH (ref 70–99)

## 2020-12-24 LAB — CULTURE, RESPIRATORY W GRAM STAIN

## 2020-12-24 LAB — PHENOBARBITAL LEVEL: Phenobarbital: 23.2 ug/mL (ref 15.0–30.0)

## 2020-12-24 LAB — PHENYTOIN LEVEL, TOTAL: Phenytoin Lvl: 8.1 ug/mL — ABNORMAL LOW (ref 10.0–20.0)

## 2020-12-24 IMAGING — DX DG CHEST 1V PORT
1 series · 1 of 1 positions shown · non-contrast
Comparison: [DATE]

CLINICAL DATA: Respiratory failure

EXAM:
PORTABLE CHEST 1 VIEW

[chest]
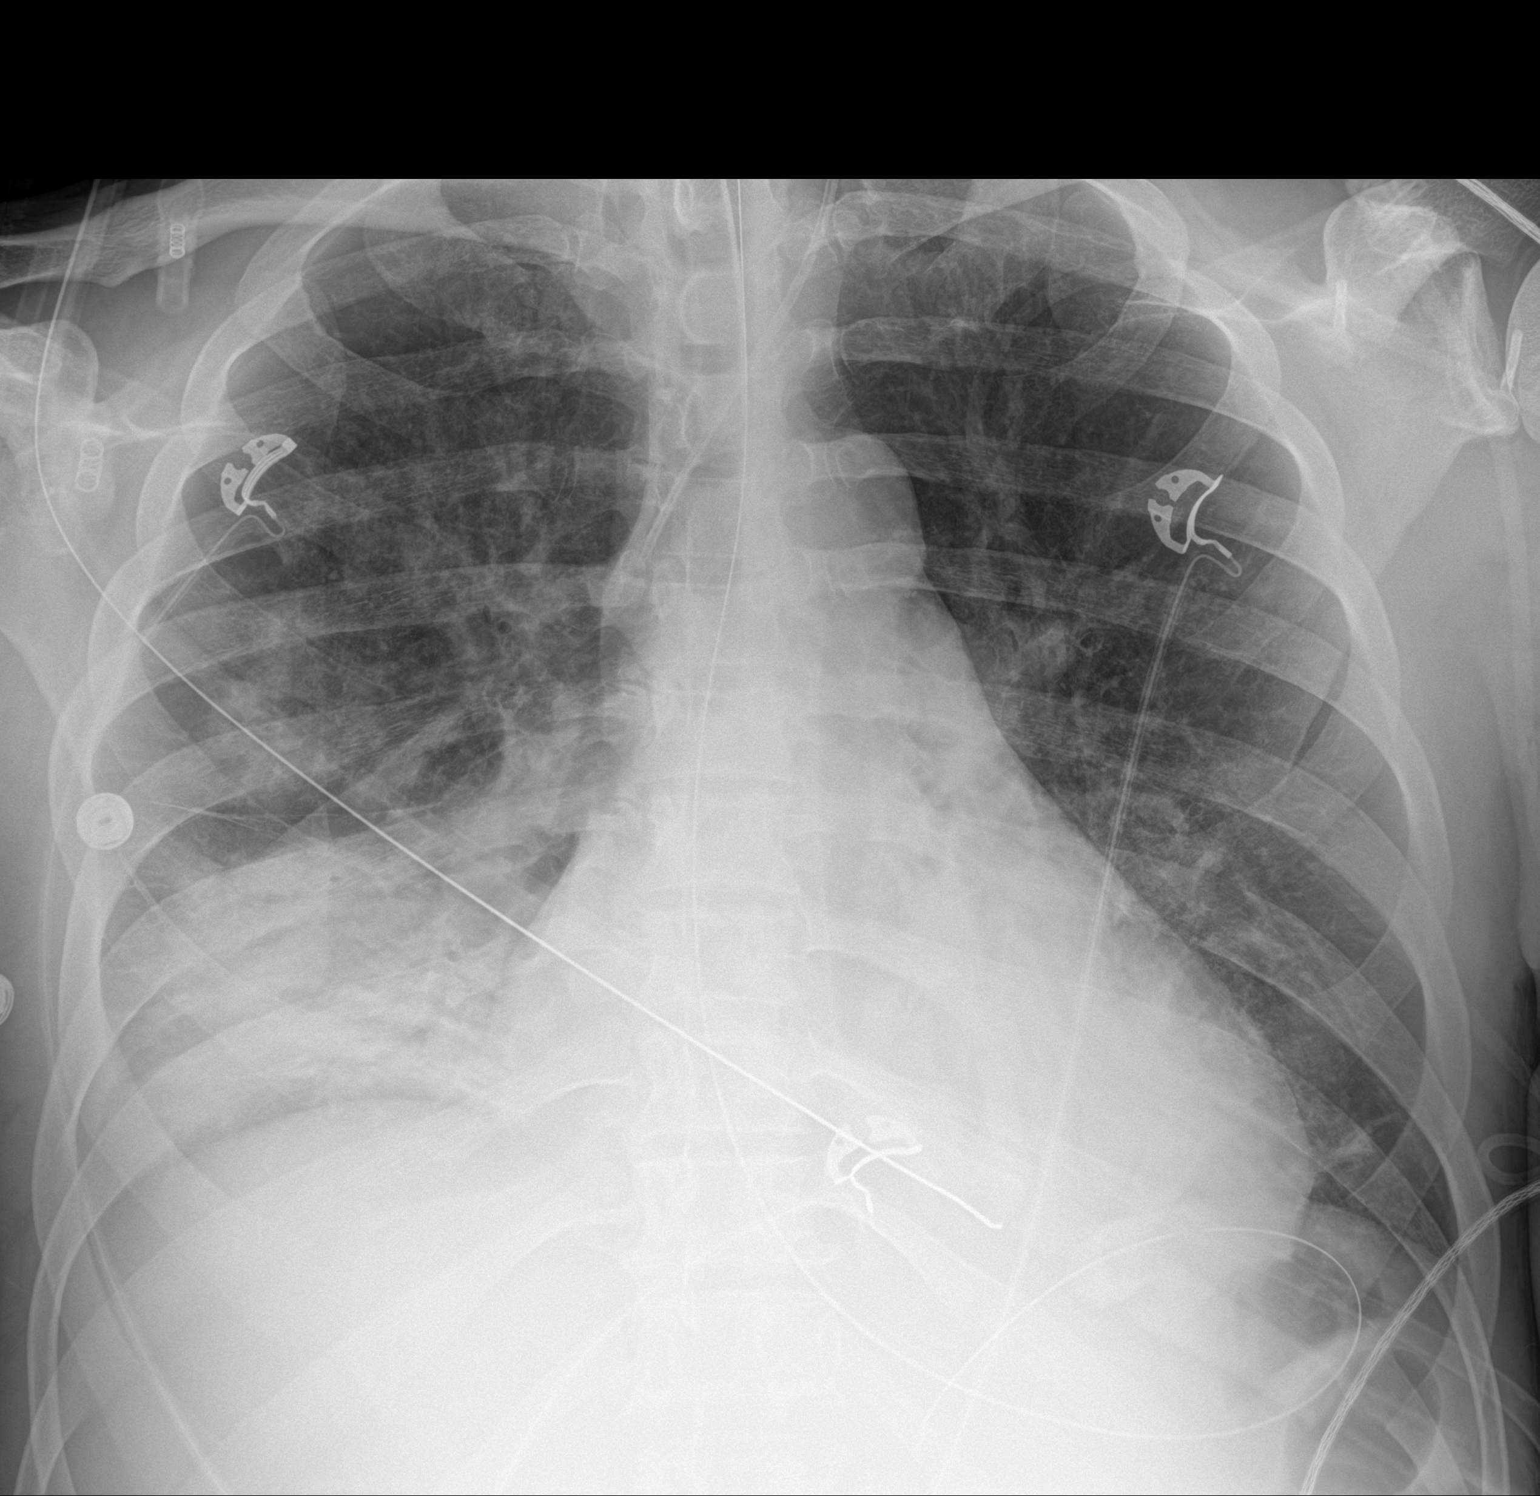

[1 of 1 positions shown; findings below may reference images not displayed]

FINDINGS: Endotracheal tube terminates 3.7 cm above carina. Nasogastric tube
is looped in the stomach with the tip in the proximal stomach.

Left internal jugular line tip at mid SVC. mild cardiomegaly. No
pleural effusion or pneumothorax. Right greater than left base
airspace disease is mildly progressive. Developing inferior right
upper lobe airspace disease as well.
IMPRESSION: Worsening aeration, favoring progressive right greater than left
pneumonia or aspiration.

Nasogastric tube looped with tip in proximal stomach. Consider
advancement.

## 2020-12-24 IMAGING — DX DG CHEST 1V PORT
1 series · 1 of 1 positions shown · non-contrast
Comparison: [DATE]

CLINICAL DATA: Acute respiratory failure. Endotracheally intubated.

EXAM:
PORTABLE CHEST 1 VIEW

[chest ap]
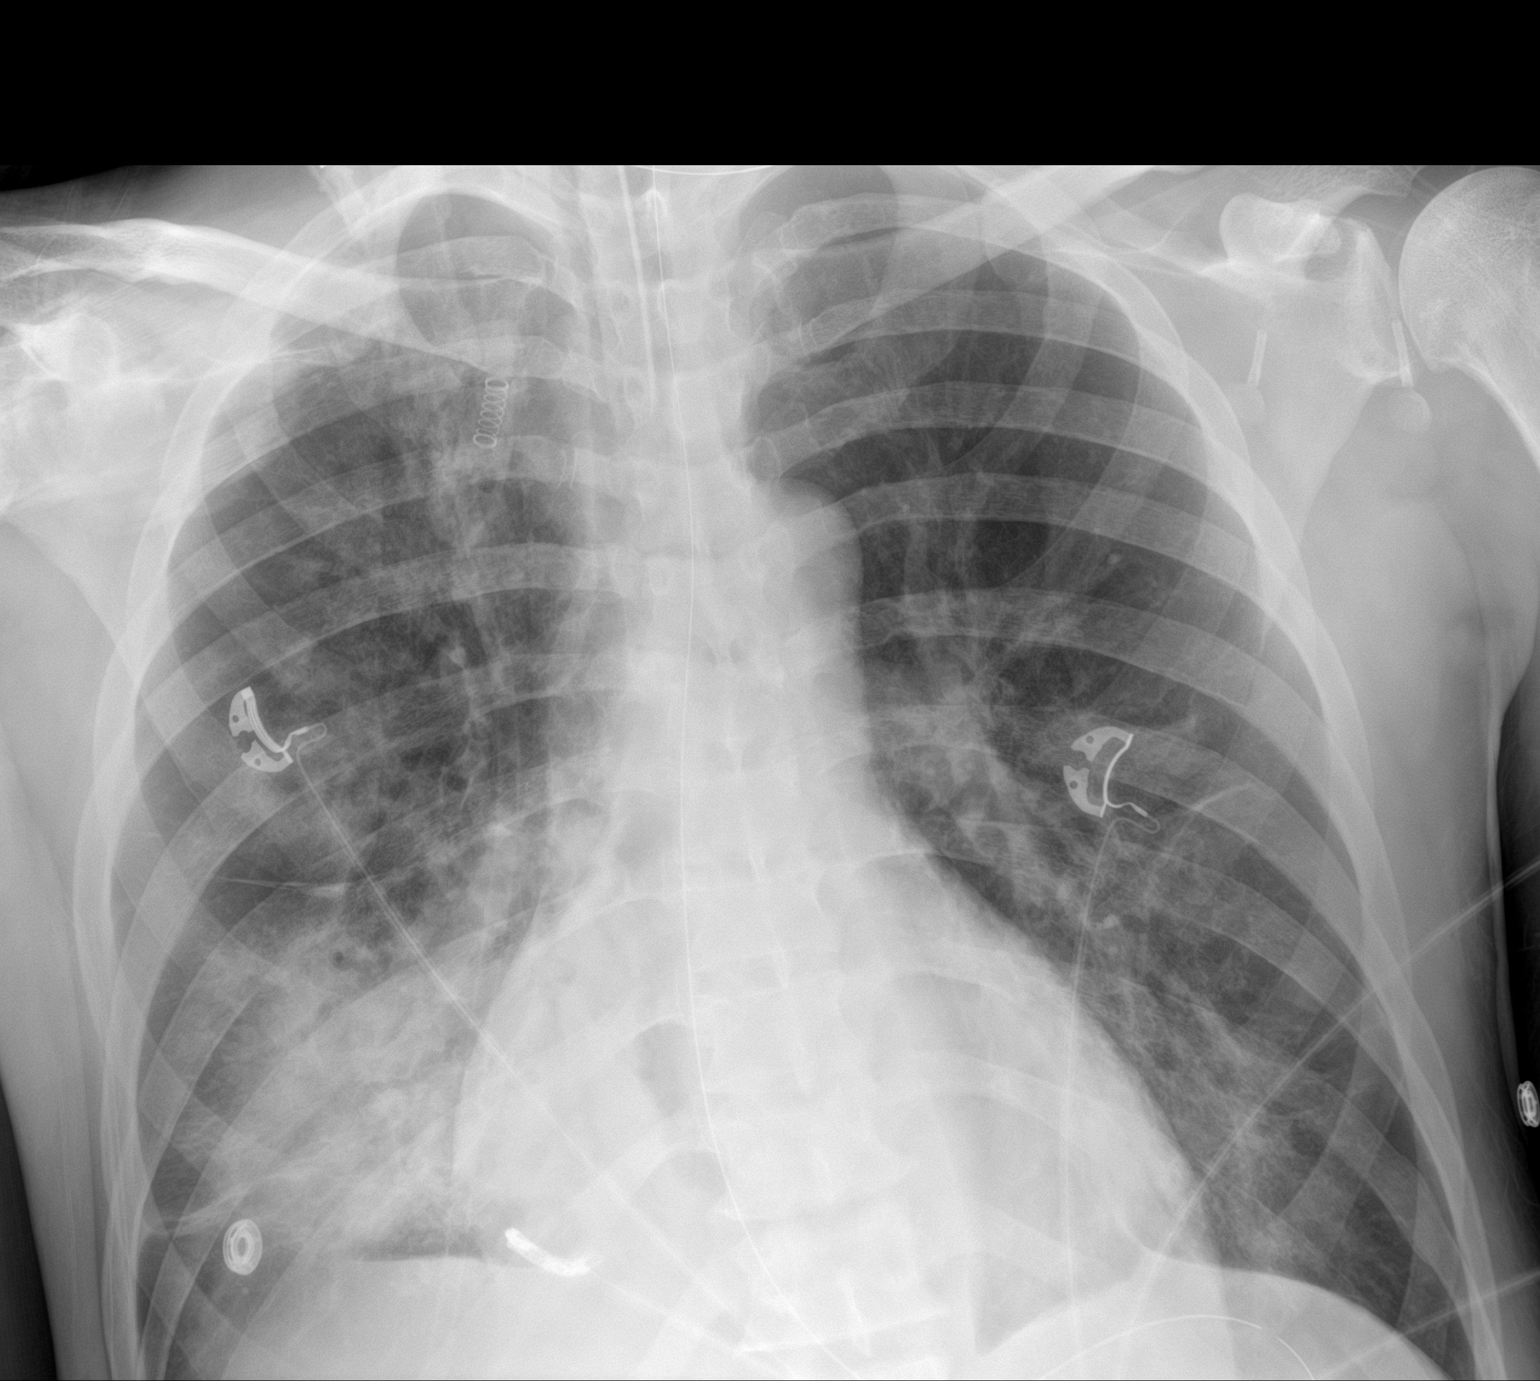

[1 of 1 positions shown; findings below may reference images not displayed]

FINDINGS: The endotracheal tube and nasogastric tube remain in appropriate
position. The left jugular central venous catheter has been removed
since previous study. No evidence of pneumothorax.

Heart size is stable. Airspace disease is again seen in both lower
lungs, right side greater than left, without significant change.
IMPRESSION: No significant change in bilateral lower lung airspace disease,
right side greater than left.

## 2020-12-24 MED ORDER — SODIUM CHLORIDE 0.9 % IV SOLN
2.0000 g | Freq: Three times a day (TID) | INTRAVENOUS | Status: AC
  Administered 2020-12-24 – 2020-12-27 (×9): 2 g via INTRAVENOUS
  Filled 2020-12-24 (×9): qty 2

## 2020-12-24 MED ORDER — FUROSEMIDE 10 MG/ML IJ SOLN
40.0000 mg | Freq: Once | INTRAMUSCULAR | Status: AC
  Administered 2020-12-24: 40 mg via INTRAVENOUS
  Filled 2020-12-24: qty 4

## 2020-12-24 MED ORDER — FENTANYL 2500MCG IN NS 250ML (10MCG/ML) PREMIX INFUSION
50.0000 ug/h | INTRAVENOUS | Status: DC
  Administered 2020-12-24: 50 ug/h via INTRAVENOUS
  Administered 2020-12-26: 100 ug/h via INTRAVENOUS
  Administered 2020-12-27: 150 ug/h via INTRAVENOUS
  Filled 2020-12-24 (×3): qty 250

## 2020-12-24 MED ORDER — FENTANYL BOLUS VIA INFUSION
50.0000 ug | INTRAVENOUS | Status: DC | PRN
  Administered 2020-12-24: 50 ug via INTRAVENOUS
  Administered 2020-12-24: 100 ug via INTRAVENOUS
  Administered 2020-12-25: 50 ug via INTRAVENOUS
  Filled 2020-12-24: qty 100

## 2020-12-24 MED ORDER — LEVETIRACETAM 100 MG/ML PO SOLN
1500.0000 mg | Freq: Two times a day (BID) | ORAL | Status: DC
  Administered 2020-12-24 – 2021-01-16 (×46): 1500 mg
  Filled 2020-12-24 (×48): qty 15

## 2020-12-24 MED ORDER — PHENYTOIN 50 MG PO CHEW
100.0000 mg | CHEWABLE_TABLET | Freq: Three times a day (TID) | ORAL | Status: DC
  Administered 2020-12-24 – 2020-12-26 (×7): 100 mg
  Filled 2020-12-24 (×8): qty 2

## 2020-12-24 MED ORDER — LACOSAMIDE 200 MG PO TABS
200.0000 mg | ORAL_TABLET | Freq: Two times a day (BID) | ORAL | Status: DC
  Administered 2020-12-24 – 2020-12-25 (×3): 200 mg
  Filled 2020-12-24 (×3): qty 1

## 2020-12-24 NOTE — Progress Notes (Signed)
  Phenytoin Initial Consult Indication: Seizures  No Known Allergies  Patient Measurements: Height: _0  (180.3 cm) Weight: 91.7 kg (202 lb 2.6 oz) IBW/kg (Calculated) : 75.3   Body mass index is 28.2 kg/m.   Vital signs: Temp: 102.2 F (39 C) (08/21 0746) Temp Source: Oral (08/21 0746) BP: 126/77 (08/21 0600) Pulse Rate: 97 (08/21 0600)  Labs: Lab Results  Component Value Date/Time   Phenytoin Lvl 8.1 (L) 12/24/2020 0520   Lab Results  Component Value Date   PHENYTOIN 8.1 (L) 12/24/2020   Estimated Creatinine Clearance: 100.9 mL/min (by C-G formula based on SCr of 1.06 mg/dL).   Medications:  Scheduled:   bethanechol  10 mg Per Tube QID   chlorhexidine gluconate (MEDLINE KIT)  15 mL Mouth Rinse BID   Chlorhexidine Gluconate Cloth  6 each Topical Daily   docusate  100 mg Per Tube BID   enoxaparin (LOVENOX) injection  40 mg Subcutaneous Q24H   feeding supplement (PROSource TF)  45 mL Per Tube Daily   free water  30 mL Per Tube Q4H   furosemide  40 mg Intravenous Once   insulin aspart  0-9 Units Subcutaneous Q4H   lacosamide  200 mg Per Tube Q12H   levETIRAcetam  1,500 mg Per Tube Q12H   mouth rinse  15 mL Mouth Rinse 10 times per day   metoprolol tartrate  25 mg Per Tube BID   pantoprazole sodium  40 mg Per Tube Daily   PHENObarbital  65 mg Intravenous BID   phenytoin  100 mg Per Tube Q8H   polyethylene glycol  17 g Per Tube Daily   sodium chloride flush  10-40 mL Intracatheter Q12H   Infusions:   sodium chloride 10 mL/hr at 12/23/20 2300   cefTRIAXone (ROCEPHIN)  IV 2 g (12/23/20 1403)   dexmedetomidine (PRECEDEX) IV infusion 0.2 mcg/kg/hr (12/24/20 0759)   feeding supplement (VITAL 1.5 CAL) 1,000 mL (12/23/20 1748)   PRN: acetaminophen, docusate, fentaNYL (SUBLIMAZE) injection, glycopyrrolate, hydrALAZINE, midazolam, polyethylene glycol, prochlorperazine, sodium chloride flush  Assessment:  8/13 DPH lvl < 2.5, albumin 3.6 8/14 DPH lvl 15 8/15 DPH  lvl 14.8, albumin 2.7 >> corrected to 23  mcg/mL. 8/16 DPH lvl 11.8, albumin 2.7 >> corrected to 18 8/17 DPH lvl 11.2, albumin 2.0>>corrected to 22   8/18 DPH lvl 11.2, albumin 1.7>>corrected to 25.45 8/19 Free DPH levwl sent  - ip  8/20 DPH 10.2, albumin 1.6>>corrected to 18.9 8/21 DPH 8.1, albumin 1.6>>corrected to 15   8/16 phenobarb level 20.6  8/18 phenobarb level 21.6  8/19 Phenobarb 23.7 8/20 Phenobarb 25.1 8/21 Phenobarb 23.2  Seizure activity: None  Goals of care:  Total phenytoin level: 10-20 mcg/ml Free phenytoin level: 1-2 mcg/ml  Plan:  Maintenance dose: DPH 100 per tube q8hr Pharmacy will continue to follow regarding obtaining total phenytoin levels and dose adjustments if corrected phenytoin levels falls below 10.   Alanda Slim, PharmD, [facility] Clinical Pharmacist Please see AMION for all Pharmacists' Contact Phone Numbers 12/24/2020, 8:26 AM

## 2020-12-24 NOTE — Procedures (Addendum)
Patient Name: Luis Hunter  MRN: [id number]  Epilepsy Attending: Lora Havens  Referring Physician/Provider: Dr Amie Portland Duration: 12/24/2020 0030 to 12/24/2020 1944   Patient history: 45 year old with status epilepticus-has a history of bipolar disorder, depression, schizophrenia polysubstance abuse and seizures with noncompliance to medications. EEG to evaluate for seizure   Level of alertness: awake, asleep   AEDs during EEG study: LEV, PHT, phenobarb, Vimpat   Technical aspects: This EEG study was done with scalp electrodes positioned according to the 10-20 International system of electrode placement. Electrical activity was acquired at a sampling rate of '500Hz'$  and reviewed with a high frequency filter of '70Hz'$  and a low frequency filter of '1Hz'$ . EEG data were recorded continuously and digitally stored.    Description: No clear posterior dominant rhythm was seen.  Sleep was characterized by sleep spindles (12 to 14 Hz), maximum frontocentral region. EEG showed continuous generalized 3-5 Hz theta-delta slowing admixed with 15-'18Hz'$  beta activity. Intermittent generalized periodic discharges with triphasic morphology at 1 Hz were noted predominantly when awake/stimulated.   ABNORMALITY -Periodic discharges with triphasic morphology, generalized -Continuous slow, generalized   IMPRESSION: This study showed periodic discharges with triphasic morphology at 1 Hz which is on the ictal-interictal continuum. However, this morphology, frequency and reactivity to stimulation is more frequently seen with toxic-metabolic causes. There is also moderate to severe diffuse encephalopathy, nonspecific etiology. No seizures were seen during this study.   EEG appears to be improving compared to previous day.   Natasha Paulson Barbra Sarks

## 2020-12-24 NOTE — Progress Notes (Signed)
Pt asynchronous with ventilator; RR 30-40's; pt also rhodochrous in all lung files; copious amount of thick, tan secretions orally and per ETT; respiratory therapy at bedside; Dr Halford Chessman notified and en route to bedside; pt given Midazolam 4 mg and Fentanyl 50 mcg IV; no orders received; will continue to monitor patient.

## 2020-12-24 NOTE — Progress Notes (Signed)
RT NOTE: patient placed on CPAP/PSV of 12/5 at [place].  Currently tolerating well.  Will continue to monitor and wean as tolerated.

## 2020-12-24 NOTE — Progress Notes (Signed)
NAME:  Luis Hunter, MRN:  [id number], DOB:  Dec 12, 1975, LOS: 8 ADMISSION DATE:  12/16/2020, CONSULTATION DATE:  12/24/2020  REFERRING MD:  Starleen Blue, EDP [facility], CHIEF COMPLAINT:  seizures, intubated   History of Present Illness:  45 yo male smoker found unresponsive by his wife.  Noted to have multiple seizures witnessed by his wife.  Intubated for airway protection and loaded with keppra.  UDS positive for cocaine and THC.  Transferred from [facility] to [facility] for continuous EEG monitoring.  Pertinent  Medical History  Schizophrenia, Seizures, Bipolar, Depression, Cocaine abuse  Significant Hospital Events:  8/13 intubated, transferred to [facility] 8/16 respiratory aspirate with gram positive cocci, few gram negative rods 8/16 started on unasyn 8/17 unasyn discontinued 8/18 started on zosyn 8/20 vent dyssynchrony >> add precedex; EEG with periodic triphasic morphology and generalized slowing  Interim History / Subjective:  Tm 102.2.  Increased respiratory secretions, but better than 8/20.  Transitioning off LTM.  Objective   Blood pressure 126/77, pulse 97, temperature (!) 102.2 F (39 C), temperature source Oral, resp. rate (!) 22, height '5\' 11"'$  (1.803 m), weight 91.7 kg, SpO2 98 %.    Vent Mode: PRVC FiO2 (%):  [40 %] 40 % Set Rate:  [20 bmp] 20 bmp Vt Set:  [600 mL] 600 mL PEEP:  [5 cmH20] 5 cmH20 Plateau Pressure:  [17 cmH20-18 cmH20] 18 cmH20   Intake/Output Summary (Last 24 hours) at 12/24/2020 0809 Last data filed at 12/24/2020 0600 Gross per 24 hour  Intake 2436.69 ml  Output 1216 ml  Net 1220.69 ml   Filed Weights   12/20/20 0102 12/21/20 0500 12/23/20 0500  Weight: 83.2 kg 86.8 kg 91.7 kg    Examination:  General - sedated Eyes - pupils reactive ENT - ETT in place Cardiac - regular rate/rhythm, no murmur Chest - scattered rhonchi Abdomen - soft, non tender, + bowel sounds Extremities - 1+ edema Skin - no rashes Neuro - RASS -2  Resolved Hospital Problem list    Hypokalemia, Hypophosphatemia, AKI from ATN 2nd to sepsis  Assessment & Plan:   Acute hypoxic/hypercapnic respiratory failure and ARDS with compromise airway in setting of Aspiration pneumonitis with Serratia in sputum culture. - pressure support wean; not ready for extubation trial until mental status and secretions improved - goal SpO2 90 to 95% - day 6 of Abx, currently on rocephin - prn robinul for respiratory secretions  Fever. - place peripheral IVs and then d/c central line on 8/21 - d/c arterial line 8/21  Status epilepticus with hx of seizure disorder. Polysubstance abuse. Hx of depression, bipolar, schizophrenia. - transition off LTM 8/21 - neurology consulted - continue keppra, vimpat, phenobarbital, phenytoin - prn ativan for seizures  Acute urinary retention. - continue bethanechol  Chronic systolic CHF, hypertension. - continue lopressor - lasix 40 mg IV x one on 8/21  Anemia of critical illness and chronic disease. - f/u CBC - transfuse for Hb < 7  Best Practice (right click and "Reselect all SmartList Selections" daily)  Diet/type: tubefeeds DVT prophylaxis: lovenox GI prophylaxis: protonix Code Status:  full code Last date of multidisciplinary goals of care discussion 8/18   Labs    CMP Latest Ref Rng & Units 12/24/2020 12/23/2020 12/22/2020  Glucose 70 - 99 mg/dL 141(H) 141(H) 182(H)  BUN 6 - 20 mg/dL 32(H) 27(H) 27(H)  Creatinine 0.61 - 1.24 mg/dL 1.06 1.22 1.35(H)  Sodium 135 - 145 mmol/L 144 140 142  Potassium 3.5 - 5.1 mmol/L 4.3 4.2 4.1  Chloride 98 - 111 mmol/L 111 109 111  CO2 22 - 32 mmol/L '27 25 24  '$ Calcium 8.9 - 10.3 mg/dL 7.9(L) 7.6(L) 7.6(L)  Total Protein 6.5 - 8.1 g/dL - 5.3(L) 4.9(L)  Total Bilirubin 0.3 - 1.2 mg/dL - 1.0 1.0  Alkaline Phos 38 - 126 U/L - 104 95  AST 15 - 41 U/L - 68(H) 73(H)  ALT 0 - 44 U/L - 45(H) 38    CBC Latest Ref Rng & Units 12/24/2020 12/23/2020 12/22/2020  WBC 4.0 - 10.5 K/uL 18.6(H) 15.4(H) 19.5(H)   Hemoglobin 13.0 - 17.0 g/dL 9.7(L) 10.2(L) 10.8(L)  Hematocrit 39.0 - 52.0 % 28.3(L) 29.4(L) 32.1(L)  Platelets 150 - 400 K/uL 171 178 198    ABG    Component Value Date/Time   PHART 7.440 12/22/2020 1014   PCO2ART 37.3 12/22/2020 1014   PO2ART 78.6 (L) 12/22/2020 1014   HCO3 24.5 12/22/2020 1014   TCO2 24 12/21/2020 1507   ACIDBASEDEF 1.0 12/21/2020 1101   O2SAT 94.6 12/22/2020 1014    CBG (last 3)  Recent Labs    12/23/20 2316 12/24/20 0314 12/24/20 0726  GLUCAP 114* 113* 123*    Critical care time: 38 minutes  Chesley Mires, MD [place] Pager - [phone] 12/24/2020, 8:09 AM

## 2020-12-24 NOTE — Progress Notes (Signed)
D/C no skin break down noted. [facility] notified.

## 2020-12-25 ENCOUNTER — Inpatient Hospital Stay (HOSPITAL_COMMUNITY): Payer: Medicare HMO

## 2020-12-25 ENCOUNTER — Encounter (HOSPITAL_COMMUNITY): Payer: Self-pay | Admitting: Internal Medicine

## 2020-12-25 ENCOUNTER — Encounter (HOSPITAL_COMMUNITY)
Admission: AD | Disposition: A | Discharge status: Nursing Home (Includes ICF) | Payer: Self-pay | Source: Other Acute Inpatient Hospital | Attending: Pulmonary Disease

## 2020-12-25 DIAGNOSIS — R9431 Abnormal electrocardiogram [ECG] [EKG]: Secondary | ICD-10-CM | POA: Diagnosis not present

## 2020-12-25 DIAGNOSIS — J156 Pneumonia due to other aerobic Gram-negative bacteria: Secondary | ICD-10-CM

## 2020-12-25 DIAGNOSIS — I213 ST elevation (STEMI) myocardial infarction of unspecified site: Secondary | ICD-10-CM

## 2020-12-25 DIAGNOSIS — G40901 Epilepsy, unspecified, not intractable, with status epilepticus: Secondary | ICD-10-CM | POA: Diagnosis not present

## 2020-12-25 HISTORY — PX: LEFT HEART CATH AND CORONARY ANGIOGRAPHY: CATH118249

## 2020-12-25 LAB — CBC
HCT: 29.3 % — ABNORMAL LOW (ref 39.0–52.0)
Hemoglobin: 9.9 g/dL — ABNORMAL LOW (ref 13.0–17.0)
MCH: 29.5 pg (ref 26.0–34.0)
MCHC: 33.8 g/dL (ref 30.0–36.0)
MCV: 87.2 fL (ref 80.0–100.0)
Platelets: 185 10*3/uL (ref 150–400)
RBC: 3.36 MIL/uL — ABNORMAL LOW (ref 4.22–5.81)
RDW: 14.9 % (ref 11.5–15.5)
WBC: 23.5 10*3/uL — ABNORMAL HIGH (ref 4.0–10.5)
nRBC: 0.1 % (ref 0.0–0.2)

## 2020-12-25 LAB — GLUCOSE, CAPILLARY
Glucose-Capillary: 106 mg/dL — ABNORMAL HIGH (ref 70–99)
Glucose-Capillary: 113 mg/dL — ABNORMAL HIGH (ref 70–99)
Glucose-Capillary: 116 mg/dL — ABNORMAL HIGH (ref 70–99)
Glucose-Capillary: 132 mg/dL — ABNORMAL HIGH (ref 70–99)
Glucose-Capillary: 134 mg/dL — ABNORMAL HIGH (ref 70–99)
Glucose-Capillary: 149 mg/dL — ABNORMAL HIGH (ref 70–99)
Glucose-Capillary: 92 mg/dL (ref 70–99)

## 2020-12-25 LAB — BASIC METABOLIC PANEL
Anion gap: 8 (ref 5–15)
BUN: 37 mg/dL — ABNORMAL HIGH (ref 6–20)
CO2: 25 mmol/L (ref 22–32)
Calcium: 7.9 mg/dL — ABNORMAL LOW (ref 8.9–10.3)
Chloride: 112 mmol/L — ABNORMAL HIGH (ref 98–111)
Creatinine, Ser: 1.11 mg/dL (ref 0.61–1.24)
GFR, Estimated: >60 mL/min (ref >60)
Glucose, Bld: 109 mg/dL — ABNORMAL HIGH (ref 70–99)
Potassium: 4.6 mmol/L (ref 3.5–5.1)
Sodium: 145 mmol/L (ref 135–145)

## 2020-12-25 LAB — PHENYTOIN LEVEL, TOTAL: Phenytoin Lvl: 5.5 ug/mL — ABNORMAL LOW (ref 10.0–20.0)

## 2020-12-25 LAB — ECHOCARDIOGRAM COMPLETE
AR max vel: 3.41 cm2
AV Area VTI: 4.04 cm2
AV Area mean vel: 3.53 cm2
AV Mean grad: 4 mmHg
AV Peak grad: 8.5 mmHg
Ao pk vel: 1.46 m/s
Calc EF: 66 %
Height: 71 in
S' Lateral: 4.9 cm
Single Plane A2C EF: 63.7 %
Single Plane A4C EF: 67.3 %
Weight: 3234.59 oz

## 2020-12-25 LAB — ALBUMIN: Albumin: 1.6 g/dL — ABNORMAL LOW (ref 3.5–5.0)

## 2020-12-25 LAB — PHENYTOIN LEVEL, FREE AND TOTAL
Phenytoin, Free: 3.5 ug/mL — ABNORMAL HIGH (ref 1.0–2.0)
Phenytoin, Total: 11.4 ug/mL (ref 10.0–20.0)

## 2020-12-25 LAB — TROPONIN I (HIGH SENSITIVITY): Troponin I (High Sensitivity): 73 ng/L — ABNORMAL HIGH (ref <18)

## 2020-12-25 LAB — PHENOBARBITAL LEVEL: Phenobarbital: 26.4 ug/mL (ref 15.0–30.0)

## 2020-12-25 IMAGING — DX DG CHEST 1V PORT
2 series · 2 of 2 positions shown · non-contrast
Comparison: [DATE]

CLINICAL DATA: Respiratory failure

EXAM:
PORTABLE CHEST 1 VIEW

[chest ap (1 of 2)]
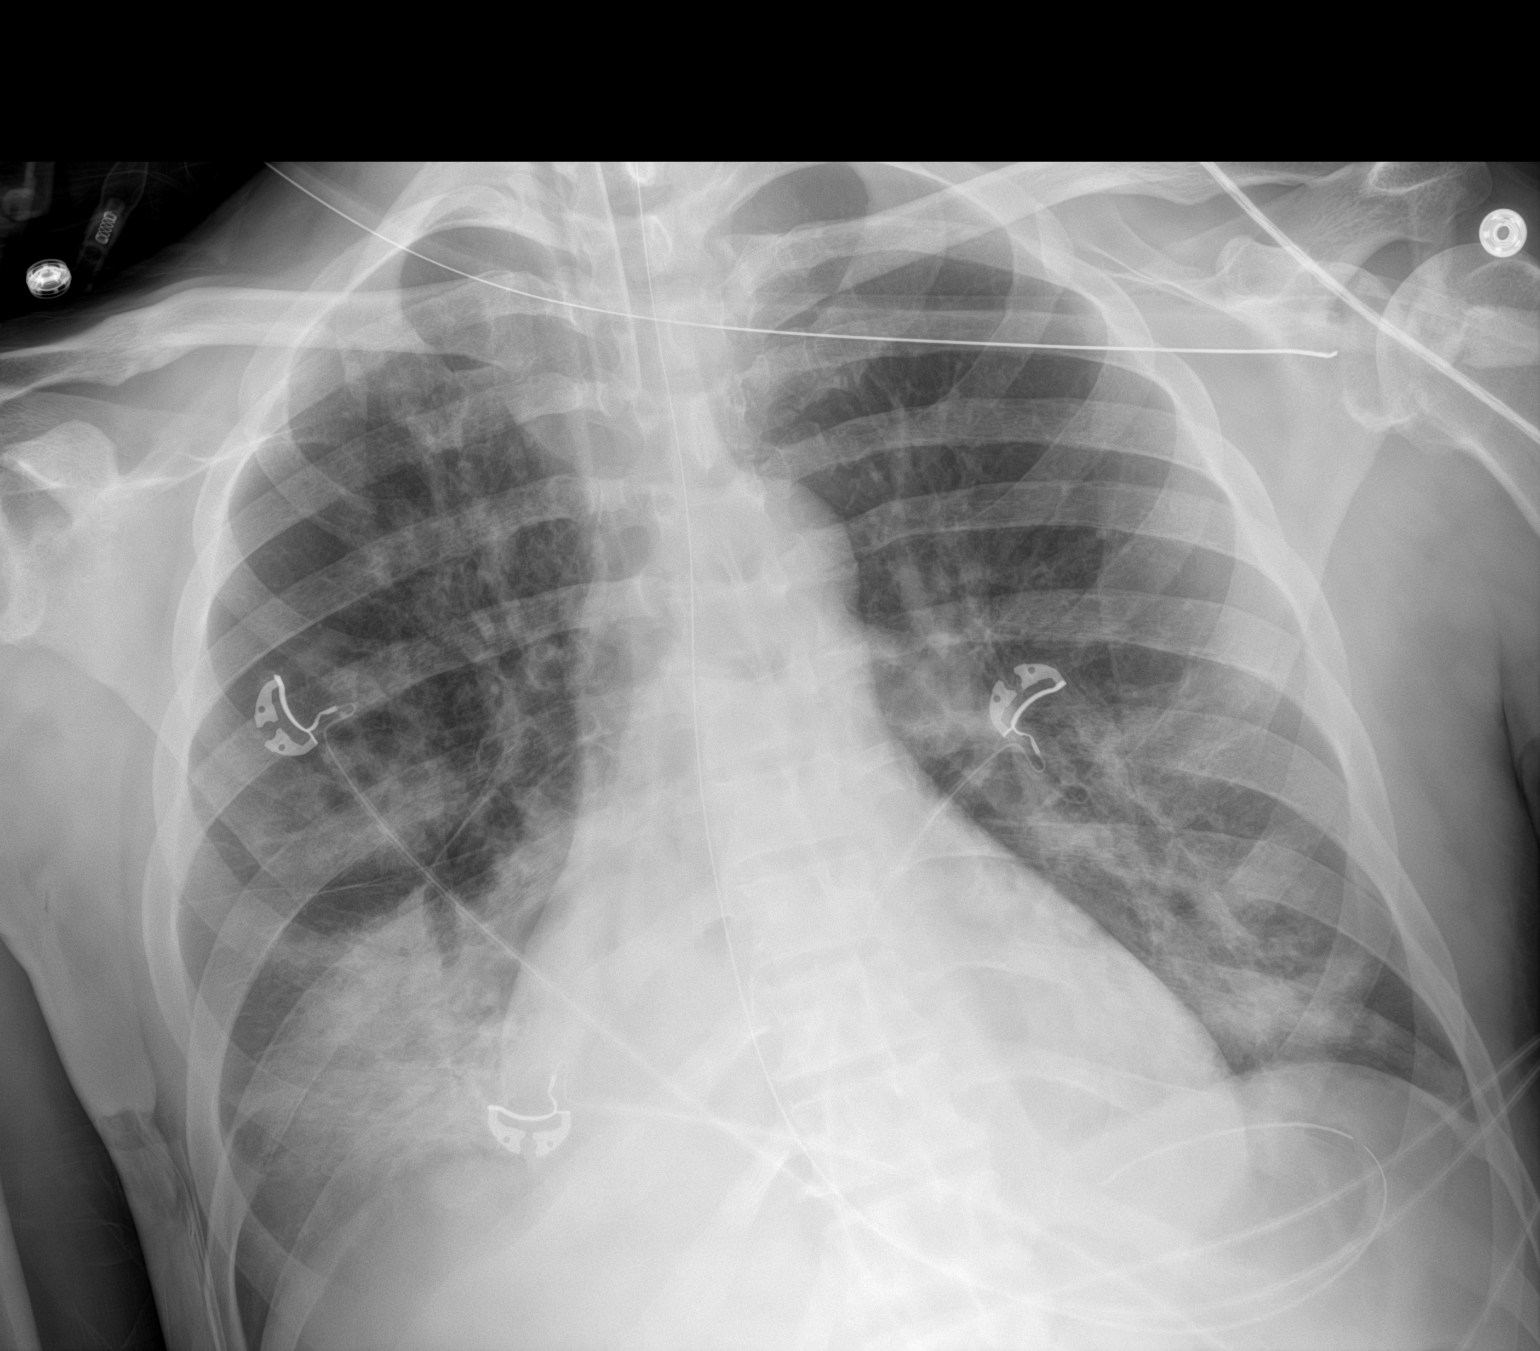

[chest ap (2 of 2)]
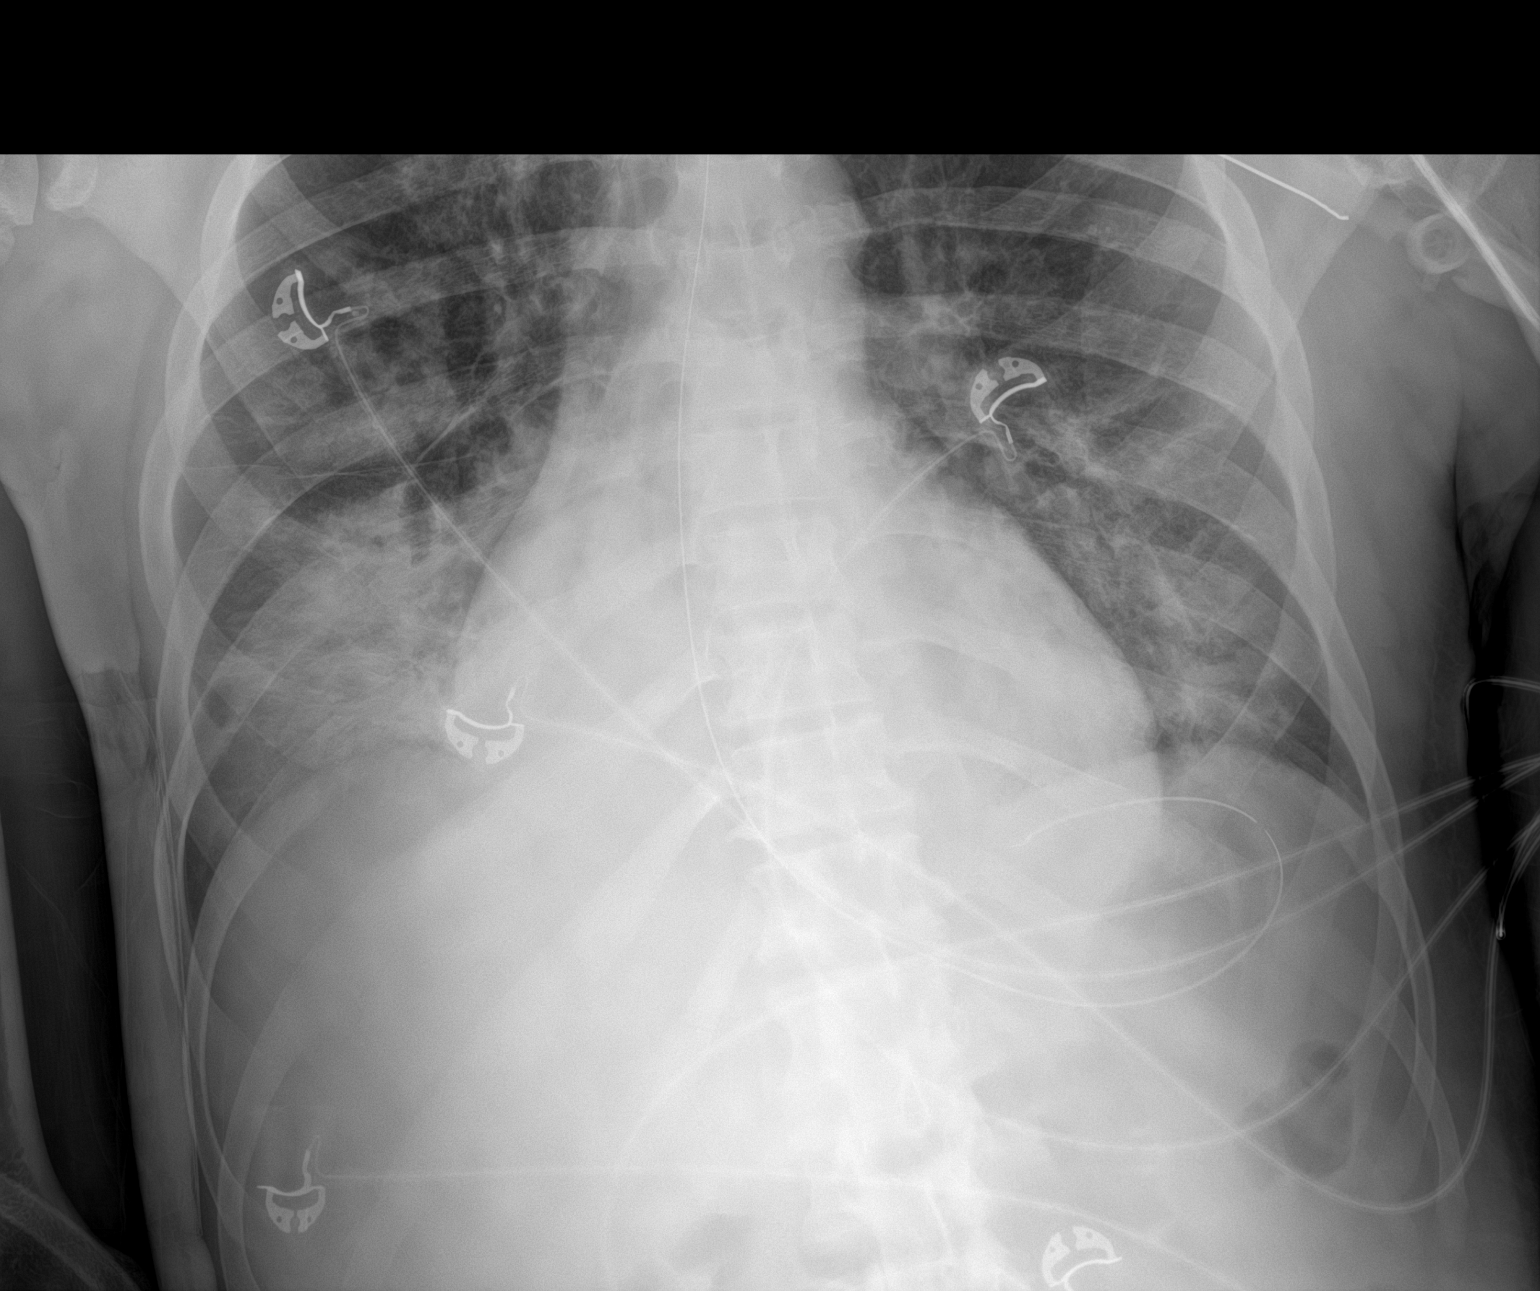

[2 of 2 positions shown; findings below may reference images not displayed]

FINDINGS: Endotracheal tube tip 4 cm above the carina. Orogastric or
nasogastric tube enters the stomach. Widespread patchy bilateral
infiltrates consistent with pneumonia appear similar allowing for
technical differences. Question small areas of cavitation,
particularly on the right. No worsening or new finding since
yesterday.
IMPRESSION: Persistent bilateral pulmonary infiltrates, lower lobe predominant.
No worsening or new finding since yesterday.

## 2020-12-25 SURGERY — LEFT HEART CATH AND CORONARY ANGIOGRAPHY
Anesthesia: LOCAL

## 2020-12-25 MED ORDER — LIDOCAINE HCL (PF) 1 % IJ SOLN
INTRAMUSCULAR | Status: DC | PRN
  Administered 2020-12-25: 2 mL

## 2020-12-25 MED ORDER — SODIUM CHLORIDE 0.9% FLUSH
3.0000 mL | Freq: Two times a day (BID) | INTRAVENOUS | Status: DC
  Administered 2020-12-25 – 2021-01-16 (×30): 3 mL via INTRAVENOUS

## 2020-12-25 MED ORDER — LABETALOL HCL 5 MG/ML IV SOLN
5.0000 mg | INTRAVENOUS | Status: AC | PRN
  Administered 2020-12-25 – 2020-12-26 (×2): 5 mg via INTRAVENOUS
  Filled 2020-12-25 (×2): qty 4

## 2020-12-25 MED ORDER — LACOSAMIDE 50 MG PO TABS
100.0000 mg | ORAL_TABLET | Freq: Two times a day (BID) | ORAL | Status: DC
  Administered 2020-12-25 – 2021-01-10 (×32): 100 mg
  Filled 2020-12-25 (×32): qty 2

## 2020-12-25 MED ORDER — HEPARIN (PORCINE) IN NACL 1000-0.9 UT/500ML-% IV SOLN
INTRAVENOUS | Status: DC | PRN
  Administered 2020-12-25 (×2): 500 mL

## 2020-12-25 MED ORDER — HEPARIN (PORCINE) IN NACL 1000-0.9 UT/500ML-% IV SOLN
INTRAVENOUS | Status: AC
  Filled 2020-12-25: qty 500

## 2020-12-25 MED ORDER — VERAPAMIL HCL 2.5 MG/ML IV SOLN
INTRAVENOUS | Status: AC
  Filled 2020-12-25: qty 2

## 2020-12-25 MED ORDER — LABETALOL HCL 5 MG/ML IV SOLN
10.0000 mg | INTRAVENOUS | Status: AC | PRN
  Administered 2020-12-25: 10 mg via INTRAVENOUS
  Filled 2020-12-25: qty 4

## 2020-12-25 MED ORDER — SODIUM CHLORIDE 0.9 % IV SOLN: 250.0000 mL | INTRAVENOUS | Status: DC | PRN

## 2020-12-25 MED ORDER — VERAPAMIL HCL 2.5 MG/ML IV SOLN
INTRAVENOUS | Status: DC | PRN
  Administered 2020-12-25: 10 mL via INTRA_ARTERIAL

## 2020-12-25 MED ORDER — LIDOCAINE HCL (PF) 1 % IJ SOLN
INTRAMUSCULAR | Status: AC
  Filled 2020-12-25: qty 30

## 2020-12-25 MED ORDER — SODIUM CHLORIDE 0.9 % IV SOLN: INTRAVENOUS | Status: AC

## 2020-12-25 MED ORDER — ZINC OXIDE 40 % EX OINT
TOPICAL_OINTMENT | Freq: Two times a day (BID) | CUTANEOUS | Status: DC
  Administered 2020-12-26 – 2021-01-15 (×7): 1 via TOPICAL
  Filled 2020-12-25 (×4): qty 57

## 2020-12-25 MED ORDER — IOHEXOL 350 MG/ML SOLN
INTRAVENOUS | Status: DC | PRN
  Administered 2020-12-25: 60 mL

## 2020-12-25 MED ORDER — HEPARIN SODIUM (PORCINE) 1000 UNIT/ML IJ SOLN
INTRAMUSCULAR | Status: DC | PRN
  Administered 2020-12-25: 4500 [IU] via INTRAVENOUS

## 2020-12-25 MED ORDER — ASPIRIN 81 MG PO CHEW
324.0000 mg | CHEWABLE_TABLET | Freq: Once | ORAL | Status: AC
  Administered 2020-12-25: 324 mg
  Filled 2020-12-25: qty 4

## 2020-12-25 MED ORDER — HEPARIN SODIUM (PORCINE) 1000 UNIT/ML IJ SOLN
INTRAMUSCULAR | Status: AC
  Filled 2020-12-25: qty 1

## 2020-12-25 MED ORDER — SODIUM CHLORIDE 0.9% FLUSH
3.0000 mL | INTRAVENOUS | Status: DC | PRN
  Administered 2020-12-29 – 2020-12-30 (×2): 3 mL via INTRAVENOUS

## 2020-12-25 SURGICAL SUPPLY — 10 items
CATH 5FR JL3.5 JR4 ANG PIG MP (CATHETERS) ×2 IMPLANT
DEVICE RAD COMP TR BAND LRG (VASCULAR PRODUCTS) ×2 IMPLANT
GLIDESHEATH SLEND SS 6F .021 (SHEATH) ×4 IMPLANT
GUIDEWIRE INQWIRE 1.5J.035X260 (WIRE) ×1 IMPLANT
INQWIRE 1.5J .035X260CM (WIRE) ×2
KIT ENCORE 26 ADVANTAGE (KITS) ×2 IMPLANT
KIT HEART LEFT (KITS) ×2 IMPLANT
PACK CARDIAC CATHETERIZATION (CUSTOM PROCEDURE TRAY) ×2 IMPLANT
TRANSDUCER W/STOPCOCK (MISCELLANEOUS) ×2 IMPLANT
TUBING CIL FLEX 10 FLL-RA (TUBING) ×2 IMPLANT

## 2020-12-25 NOTE — Brief Op Note (Signed)
BRIEF CARDIAC CATHETERIZATION NOTE  DATE: 12/25/2020  TIME: 7:29 AM  PATIENT:  Luis Hunter  45 y.o. male  PRE-OPERATIVE DIAGNOSIS:  STEMI  POST-OPERATIVE DIAGNOSIS:  Abnormal EKG  PROCEDURE:  Procedure(s): LEFT HEART CATH AND CORONARY ANGIOGRAPHY (N/A)  SURGEON:  Surgeon(s) and Role:    * Derrill Bagnell, MD - Primary  FINDINGS: No angiographically significant coronary artery disease. Grossly normal left ventricular systolic function. Normal left ventricular filling pressure.  RECOMMENDATIONS: Obtain echocardiogram.  Suspect EKG changes concerning for STEMI reflect abnormal repolarization in the setting of LVH and tachycardia. Primary prevention of coronary artery disease. Return to 48M-ICU for ongoing management per critical care.  Nelva Bush, MD [facility]

## 2020-12-25 NOTE — Progress Notes (Addendum)
Subjective: No acute events overnight.  No clinical seizures.  Stopped sedation this morning.  ROS: Unable to obtain due to poor mental status  Examination  Vital signs in last 24 hours: Temp:  [98.4 F (36.9 C)-102.6 F (39.2 C)] 100.9 F (38.3 C) (08/22 0800) Pulse Rate:  [95-134] 116 (08/22 0625) Resp:  [20-35] 30 (08/22 0625) BP: (100-151)/(55-106) 127/74 (08/22 0625) SpO2:  [94 %-100 %] 96 % (08/22 0732) FiO2 (%):  [30 %-60 %] 40 % (08/22 0732)  General: lying in bed, NAD CVS: pulse-normal rate and rhythm RS: intubated, CTAB Extremities: normal, warm Neuro: comatose, does not open eyes to noxious stimuli, PERRLA, corneal reflex present, gag reflex present, does not withdraw to noxious stimuli in all extremities     Basic Metabolic Panel: Recent Labs  Lab  0000 12/18/20 1856 12/19/20 0542 12/19/20 2046 12/20/20 0448 12/20/20 1058 12/21/20 0440 12/21/20 1101 12/21/20 1507 12/22/20 0351 12/23/20 0500 12/24/20 0520 12/25/20 0142  NA  --   --  142   < > 145   < > 139   < > 141 142 140 144 145  K  --   --  4.3   < > 3.7   < > 3.8   < > 3.6 4.1 4.2 4.3 4.6  CL   < >  --  114*  --  114*  --  110  --   --  111 109 111 112*  CO2   < >  --  20*  --  22  --  21*  --   --  '24 25 27 25  '$ GLUCOSE   < >  --  136*  --  123*  --  167*  --   --  182* 141* 141* 109*  BUN   < >  --  24*  --  29*  --  32*  --   --  27* 27* 32* 37*  CREATININE   < >  --  1.41*  --  1.56*  --  1.70*  --   --  1.35* 1.22 1.06 1.11  CALCIUM   < >  --  7.6*  --  7.6*  --  7.5*  --   --  7.6* 7.6* 7.9* 7.9*  MG  --  1.7 1.6*  --  1.9  --  2.4  --   --   --   --   --   --   PHOS  --  4.3 4.1  --  2.7  --  4.3  --   --   --   --   --   --    < > = values in this interval not displayed.    CBC: Recent Labs  Lab 12/21/20 0440 12/21/20 1101 12/21/20 1507 12/22/20 0351 12/23/20 0500 12/24/20 0520 12/25/20 0142  WBC 17.7*  --   --  19.5* 15.4* 18.6* 23.5*  NEUTROABS 15.7*  --   --   --  11.5*  --    --   HGB 11.1*   < > 10.2* 10.8* 10.2* 9.7* 9.9*  HCT 32.5*   < > 30.0* 32.1* 29.4* 28.3* 29.3*  MCV 86.2  --   --  84.5 85.2 86.0 87.2  PLT 222  --   --  198 178 171 185   < > = values in this interval not displayed.     Coagulation Studies: No results for input(s): LABPROT, INR in the last 72 hours.  Imaging  No new brain imaging overnight   ASSESSMENT AND PLAN:  45 year old male with history of epilepsy who presented with status epilepticus in the setting of medication noncompliance.   Status epilepticus (resolved) Epilepsy with breakthrough seizure Acute encephalopathy, postictal and medication induced -Stopped sedation this morning.  Continues to be comatose   Recommendations -Reduce Vimpat to 100 mg twice daily  -Continue Keppra to 1500 mg twice daily, phenobarbital 65 mg twice daily and phenytoin 100 mg every 8 hours -If patient remains seizure-free, will continue to wean AEDs -Minimize sedation to help with neuro prognostication -Continue seizure precautions -As needed IV Ativan 2 mg for clinical seizure-like activity -Management of rest of comorbidities per primary team   I have spent a total of 35  minutes with the patient reviewing hospital notes,  test results, labs and examining the patient as well as establishing an assessment and plan.  > 50% of time was spent in direct patient care.   Zeb Comfort Epilepsy [facility] For questions after 5pm please refer to AMION to reach the Neurologist on call

## 2020-12-25 NOTE — Consult Note (Signed)
Cardiology Consultation:   Patient ID: HOLMES HAYS MRN: [id number]; DOB: [birth year]  Admit date: 12/16/2020 Date of Consult: 12/25/2020  PCP:  [facility] [place] [facility] Cardiologist:  Esmond Plants, MD PhD   Patient Profile:   Luis Hunter is a 45 y.o. male with a hx of seizure disorder and schizophrenia who is being seen 12/25/2020 for the evaluation of abnormal EKG at the request of Dr. Prudencio Burly.  History of Present Illness:   Mr. Kalisz was admitted to [facility] on 8/13 with recurrent seizures and found to be in status epilepticus.  He is currently intubated and sedated.  History is obtained from the chart and nursing.  This morning, ST elevation was noted on telemetry prompting serial EKGs to be obtained over the last hour showing anterior ST segment elevation in the setting of sinus tachycardia.  Patient was also noted to be febrile at 102.6 at 4 AM with concern for aspiration pneumonia.  Patient has a history of moderately reduced LVEF by echo in 2021.  No ischemia evaluation was performed at that time.   Past Medical History:  Diagnosis Date   Bipolar disorder ([place])    Depression    Schizophrenia ([place])    Seizures ([place])     Past Surgical History:  Procedure Laterality Date   FINGER SURGERY       Home Medications:  Prior to Admission medications   Medication Sig Start Date Laiah Pouncey Date Taking? Authorizing Provider  ARIPiprazole ER (ABILIFY MAINTENA) 400 MG PRSY prefilled syringe Inject 400 mg as directed every 30 (thirty) days.  11/08/15  Yes [provider]  benztropine (COGENTIN) 0.5 MG tablet Take 0.5 mg by mouth 2 (two) times daily. 10/23/20  Yes [provider]  levETIRAcetam (KEPPRA) 750 MG tablet Take 2 tablets (1,500 mg total) by mouth 2 (two) times daily. 10/05/20 01/03/21 Yes Duffy Bruce, MD    Inpatient Medications: Scheduled Meds:  bethanechol  10 mg Per Tube QID   chlorhexidine gluconate (MEDLINE KIT)  15  mL Mouth Rinse BID   Chlorhexidine Gluconate Cloth  6 each Topical Daily   docusate  100 mg Per Tube BID   enoxaparin (LOVENOX) injection  40 mg Subcutaneous Q24H   feeding supplement (PROSource TF)  45 mL Per Tube Daily   free water  30 mL Per Tube Q4H   insulin aspart  0-9 Units Subcutaneous Q4H   lacosamide  200 mg Per Tube Q12H   levETIRAcetam  1,500 mg Per Tube Q12H   mouth rinse  15 mL Mouth Rinse 10 times per day   metoprolol tartrate  25 mg Per Tube BID   pantoprazole sodium  40 mg Per Tube Daily   PHENObarbital  65 mg Intravenous BID   phenytoin  100 mg Per Tube Q8H   polyethylene glycol  17 g Per Tube Daily   sodium chloride flush  10-40 mL Intracatheter Q12H   Continuous Infusions:  sodium chloride 10 mL/hr at 12/25/20 0200   ceFEPime (MAXIPIME) IV 2 g (12/25/20 0558)   dexmedetomidine (PRECEDEX) IV infusion 0.7 mcg/kg/hr (12/25/20 0200)   feeding supplement (VITAL 1.5 CAL) 1,000 mL (12/25/20 0600)   fentaNYL infusion INTRAVENOUS 100 mcg/hr (12/25/20 0200)   PRN Meds: acetaminophen, docusate, fentaNYL, fentaNYL (SUBLIMAZE) injection, glycopyrrolate, hydrALAZINE, midazolam, polyethylene glycol, prochlorperazine, sodium chloride flush  Allergies:   No Known Allergies  Social History:   Social History   Tobacco Use   Smoking status: Every Day  Smokeless tobacco: Never  Substance Use Topics   Alcohol use: No    Alcohol/week: 0.0 standard drinks   Drug use: Yes    Types: Marijuana, Cocaine      Family History:   Unable to obtain as the patient is intubated and sedated  ROS:  Unable to obtain as the patient is intubated and sedated.  Physical Exam/Data:   Vitals:   12/25/20 0400 12/25/20 0413 12/25/20 0500 12/25/20 0625  BP:   108/63 127/74  Pulse:   (!) 101 (!) 116  Resp:  (!) 28 (!) 22 (!) 30  Temp: (!) 102.6 F (39.2 C)     TempSrc: Axillary     SpO2:  99% 100% 100%  Weight:      Height:        Intake/Output Summary (Last 24 hours) at 12/25/2020  0652 Last data filed at 12/25/2020 0400 Gross per 24 hour  Intake 2374.24 ml  Output 2815 ml  Net -440.76 ml   Last 3 Weights 12/23/2020 12/21/2020 12/20/2020  Weight (lbs) 202 lb 2.6 oz 191 lb 5.8 oz 183 lb 6.8 oz  Weight (kg) 91.7 kg 86.8 kg 83.2 kg     Body mass index is 28.2 kg/m.  General: Thin man, intubated and sedated. HEENT: Intubated Lymph: no adenopathy Neck: No obvious JVD, though evaluation is limited by support devices. Endocrine:  No thryomegaly Vascular: No carotid bruits; FA pulses 2+ bilaterally without bruits  Cardiac: Tachycardic but regular without murmurs, rubs, or gallops. Lungs: Clear anteriorly without wheezes or crackles. Abd: soft, nontender, no hepatomegaly.  Rectal tube in place with brown stool. Ext: no edema Musculoskeletal:  No deformities.  Unable to assess strength. Skin: warm and dry  Neuro: Intubated and sedated. Psych: Unable to assess as the patient is intubated and sedated.  EKG:  The EKG was personally reviewed and demonstrates: Sinus tachycardia, LVH, and 2 mm anterior ST elevation.  Relevant CV Studies: TTE (11/19/2019): Normal LV size with moderate LVH.  LVEF 40-45% with global hypokinesis.  Normal RV size and function.  Normal biatrial size.  No significant valvular abnormality.  Laboratory Data:  High Sensitivity Troponin:   Recent Labs  Lab 12/25/20 0601  TROPONINIHS 73*     Chemistry Recent Labs  Lab 12/23/20 0500 12/24/20 0520 12/25/20 0142  NA 140 144 145  K 4.2 4.3 4.6  CL 109 111 112*  CO2 _0 GLUCOSE 141* 141* 109*  BUN 27* 32* 37*  CREATININE 1.22 1.06 1.11  CALCIUM 7.6* 7.9* 7.9*  GFRNONAA >60 >60 >60  ANIONGAP _1 Recent Labs  Lab 12/21/20 0440 12/22/20 0351 12/23/20 0500  PROT 4.8* 4.9* 5.3*  ALBUMIN 1.7* 1.6* 1.6*  AST 64* 73* 68*  ALT 30 38 45*  ALKPHOS 67 95 104  BILITOT 0.9 1.0 1.0   Hematology Recent Labs  Lab 12/23/20 0500 12/24/20 0520 12/25/20 0142  WBC 15.4* 18.6* 23.5*   RBC 3.45* 3.29* 3.36*  HGB 10.2* 9.7* 9.9*  HCT 29.4* 28.3* 29.3*  MCV 85.2 86.0 87.2  MCH 29.6 29.5 29.5  MCHC 34.7 34.3 33.8  RDW 15.2 14.9 14.9  PLT 178 171 185   BNPNo results for input(s): BNP, PROBNP in the last 168 hours.  DDimer No results for input(s): DDIMER in the last 168 hours.   Radiology/Studies:  MR BRAIN W WO CONTRAST  Result Date: 12/22/2020 CLINICAL DATA:  Seizure EXAM: MRI HEAD WITHOUT AND WITH CONTRAST TECHNIQUE: Multiplanar, multiecho pulse  sequences of the brain and surrounding structures were obtained without and with intravenous contrast. CONTRAST:  37m GADAVIST GADOBUTROL 1 MMOL/ML IV SOLN COMPARISON:  CT head 12/16/2020 FINDINGS: Brain: There is no evidence of acute intracranial hemorrhage, extra-axial fluid collection, or infarct. There is no parenchymal signal abnormality. The ventricles are not enlarged. There is no midline shift. No mass lesion is identified. There is no abnormal enhancement. The hippocampal formations are normal and symmetric. There is no structural or migrational abnormality. Vascular: Normal flow voids. Skull and upper cervical spine: Normal marrow signal. Sinuses/Orbits: There is complete opacification of the right maxillary sinus with mucosal thickening throughout the remainder of the paranasal sinuses. The globes and orbits are unremarkable. Other: Endotracheal and enteric catheters are noted. There are bilateral mastoid effusions likely related to instrumentation. IMPRESSION: No acute intracranial pathology or epileptogenic focus identified. Electronically Signed   By: PValetta MoleM.D.   On: 12/22/2020 15:33   DG Chest Port 1 View  Result Date: 12/24/2020 CLINICAL DATA:  Acute respiratory failure. Endotracheally intubated. EXAM: PORTABLE CHEST 1 VIEW COMPARISON:  12/24/2020 FINDINGS: The endotracheal tube and nasogastric tube remain in appropriate position. The left jugular central venous catheter has been removed since previous study. No  evidence of pneumothorax. Heart size is stable. Airspace disease is again seen in both lower lungs, right side greater than left, without significant change. IMPRESSION: No significant change in bilateral lower lung airspace disease, right side greater than left. Electronically Signed   By: JMarlaine HindM.D.   On: 12/24/2020 15:23   DG Chest Port 1 View  Result Date: 12/24/2020 CLINICAL DATA:  Respiratory failure EXAM: PORTABLE CHEST 1 VIEW COMPARISON:  12/20/2020 FINDINGS: Endotracheal tube terminates 3.7 cm above carina. Nasogastric tube is looped in the stomach with the tip in the proximal stomach. Left internal jugular line tip at mid SVC. mild cardiomegaly. No pleural effusion or pneumothorax. Right greater than left base airspace disease is mildly progressive. Developing inferior right upper lobe airspace disease as well. IMPRESSION: Worsening aeration, favoring progressive right greater than left pneumonia or aspiration. Nasogastric tube looped with tip in proximal stomach. Consider advancement. Electronically Signed   By: KAbigail MiyamotoM.D.   On: 12/24/2020 11:48     Assessment and Plan:   Anterior STEMI: Unable to obtain any history from the patient as he is intubated and sedated.  Has a history of moderately reduced LVEF by echo in 11/2019 but otherwise no known cardiac history.  ST elevation was noted on telemetry today.  EKG shows anterior ST elevation in the setting of sinus tachycardia.  While this could represent abnormal repolarization, given the patient's critical illness, I think it would be reasonable to perform emergent coronary angiography to exclude significant CAD.  I have discussed this with the patient's sister by phone, who has provided informed consent on his behalf.  Further recommendations to follow catheterization.  Shared Decision Making/Informed Consent The risks [stroke (1 in 1000), death (1 in 1000), kidney failure [usually temporary] (1 in 500), bleeding (1 in 200),  allergic reaction [possibly serious] (1 in 200)], benefits (diagnostic support and management of coronary artery disease) and alternatives of a cardiac catheterization were discussed in detail with Mr. HKennethdaughter and he is willing to proceed.   Fever: Potentially due to aspiration pneumonia.  Ongoing management per critical care team.  Status epilepticus: Per neurology and CCM.  For questions or updates, please contact CDumasPlease consult [email or web address] for contact info under  Signed, Nelva Bush, MD  12/25/2020 6:52 AM

## 2020-12-25 NOTE — Progress Notes (Signed)
RT NOTE: Holding on 0800 CPT due to patient being sent to cath lab this AM .  Will continue to monitor.

## 2020-12-25 NOTE — Interval H&P Note (Signed)
History and Physical Interval Note:  12/25/2020 7:07 AM  Luis Hunter  has presented today for surgery, with the diagnosis of STEMI.  The various methods of treatment have been discussed with the patient and family. After consideration of risks, benefits and other options for treatment, the patient has consented to  Procedure(s): LEFT HEART CATH AND CORONARY ANGIOGRAPHY (N/A) as a surgical intervention.  The patient's history has been reviewed, patient examined, no change in status, stable for surgery.  I have reviewed the patient's chart and labs.  Questions were answered to the patient's satisfaction.    Cath Lab Visit (complete for each Cath Lab visit)  Clinical Evaluation Leading to the Procedure:   ACS: Yes.    Non-ACS:  N/A   Shahab Polhamus

## 2020-12-25 NOTE — Progress Notes (Signed)
NAME:  Luis Hunter, MRN:  [id number], DOB:  1975-07-30, LOS: 9 ADMISSION DATE:  12/16/2020, CONSULTATION DATE:  12/25/2020  REFERRING MD:  Starleen Blue, EDP [facility], CHIEF COMPLAINT:  seizures, intubated   History of Present Illness:  45 yo male smoker found unresponsive by his wife.  Noted to have multiple seizures witnessed by his wife.  Intubated for airway protection and loaded with keppra.  UDS positive for cocaine and THC.  Transferred from [facility] to [facility] for continuous EEG monitoring.  Pertinent  Medical History  Schizophrenia, Seizures, Bipolar, Depression, Cocaine abuse  Significant Hospital Events:  8/13 intubated, transferred to [facility] 8/16 respiratory aspirate with gram positive cocci, few gram negative rods 8/16 started on unasyn 8/17 unasyn discontinued 8/18 started on zosyn 8/20 vent dyssynchrony >> add precedex; EEG with periodic triphasic morphology and generalized slowing 8/22 cardiac catheterization for STEMI-no significant coronary artery disease discovered  Interim History / Subjective:   T-max 102.6 Still with significant oral secretions, sedated  Objective   Blood pressure 127/74, pulse (!) 116, temperature (!) 102.6 F (39.2 C), temperature source Axillary, resp. rate (!) 30, height '5\' 11"'$  (1.803 m), weight 91.7 kg, SpO2 96 %.    Vent Mode: PRVC FiO2 (%):  [30 %-60 %] 40 % Set Rate:  [20 bmp-28 bmp] 28 bmp Vt Set:  [550 mL-600 mL] 550 mL PEEP:  [5 cmH20-8 cmH20] 8 cmH20 Plateau Pressure:  [17 cmH20-34 cmH20] 22 cmH20   Intake/Output Summary (Last 24 hours) at 12/25/2020 [id number] Last data filed at 12/25/2020 0600 Gross per 24 hour  Intake 2664 ml  Output 2490 ml  Net 174 ml   Filed Weights   12/20/20 0102 12/21/20 0500 12/23/20 0500  Weight: 83.2 kg 86.8 kg 91.7 kg    Examination:  General -sedated, unresponsive Eyes -pupils equal and reactive ENT -endotracheal tube in place Cardiac -S1-S2 appreciated Chest -decreased air entry at the bases, scattered  rhonchi anteriorly Abdomen -bowel sounds appreciated Extremities -no edema, no clubbing Skin - no rashes Neuro -RASS -2  7 L positive since admission Chest x-ray today with lower lobe infiltrates-unchanged from previous, reviewed by myself  Resolved Hospital Problem list   Hypokalemia, Hypophosphatemia, AKI from ATN 2nd to sepsis  Assessment & Plan:   Acute hypoxic/hypercapnic respiratory failure and ARDS Aspiration pneumonia with Serratia -Not ready for extubation with mental status changes and secretions -Day 7 of antibiotics -Robinul added for secretions -Work-up assessment today  Fever -A-line and central line discontinued 8/21 -Serratia cultured sensitive to Rocephin  Status epilepticus Polysubstance abuse History of depression/bipolar disorder/schizophrenia -Continue Keppra, Vimpat, phenobarbital, phenytoin -Ativan for seizures  Urinary retention -Continue bethanechol  Abnormal EKG with concern for STEMI -No significant angiographic evidence of coronary artery disease -Follow echocardiogram  Systolic congestive heart failure, ejection fraction of 40% 11/19/2019 Hypertension -On Lopressor -Did receive 1 dose of Lasix 8/21  Anemia critical illness and chronic disease -Monitor closely -Transfuse per protocol  Work-up assessment today   Best Practice (right click and "Reselect all SmartList Selections" daily)  Diet/type: tubefeeds DVT prophylaxis: lovenox GI prophylaxis: protonix Code Status:  full code Last date of multidisciplinary goals of care discussion 8/18   Labs    CMP Latest Ref Rng & Units 12/25/2020 12/24/2020 12/23/2020  Glucose 70 - 99 mg/dL 109(H) 141(H) 141(H)  BUN 6 - 20 mg/dL 37(H) 32(H) 27(H)  Creatinine 0.61 - 1.24 mg/dL 1.11 1.06 1.22  Sodium 135 - 145 mmol/L 145 144 140  Potassium 3.5 - 5.1 mmol/L 4.6 4.3  4.2  Chloride 98 - 111 mmol/L 112(H) 111 109  CO2 22 - 32 mmol/L '25 27 25  '$ Calcium 8.9 - 10.3 mg/dL 7.9(L) 7.9(L) 7.6(L)  Total  Protein 6.5 - 8.1 g/dL - - 5.3(L)  Total Bilirubin 0.3 - 1.2 mg/dL - - 1.0  Alkaline Phos 38 - 126 U/L - - 104  AST 15 - 41 U/L - - 68(H)  ALT 0 - 44 U/L - - 45(H)    CBC Latest Ref Rng & Units 12/25/2020 12/24/2020 12/23/2020  WBC 4.0 - 10.5 K/uL 23.5(H) 18.6(H) 15.4(H)  Hemoglobin 13.0 - 17.0 g/dL 9.9(L) 9.7(L) 10.2(L)  Hematocrit 39.0 - 52.0 % 29.3(L) 28.3(L) 29.4(L)  Platelets 150 - 400 K/uL 185 171 178    ABG    Component Value Date/Time   PHART 7.440 12/22/2020 1014   PCO2ART 37.3 12/22/2020 1014   PO2ART 78.6 (L) 12/22/2020 1014   HCO3 24.5 12/22/2020 1014   TCO2 24 12/21/2020 1507   ACIDBASEDEF 1.0 12/21/2020 1101   O2SAT 94.6 12/22/2020 1014    CBG (last 3)  Recent Labs    12/25/20 0005 12/25/20 0341 12/25/20 0835  GLUCAP 92 113* 116*   The patient is critically ill with multiple organ systems failure and requires high complexity decision making for assessment and support, frequent evaluation and titration of therapies, application of advanced monitoring technologies and extensive interpretation of multiple databases. Critical Care Time devoted to patient care services described in this note independent of APP/resident time (if applicable)  is 35 minutes.   Sherrilyn Rist MD [facility] Personal pager: See Amion If unanswered, please page CCM On-call: [phone]

## 2020-12-25 NOTE — Progress Notes (Signed)
  Echocardiogram 2D Echocardiogram has been performed.  Elmer Ramp 12/25/2020, 12:50 PM

## 2020-12-25 NOTE — Progress Notes (Signed)
Pt transported to & from cath lab via vent. No complications noted. Alphia Moh RRT notified of patients return.

## 2020-12-25 NOTE — Progress Notes (Signed)
eLink Physician-Brief Progress Note Patient Name: Luis Hunter DOB: [birth year] MRN: [id number]   Date of Service  12/25/2020  HPI/Events of Note  Hypertensive, Hydralazine not helping. S/p LHC. Sinus tachy.  eICU Interventions  Labetalol 5 mg prn q10 mins. Hold if HJR under 80.      Intervention Category Intermediate Interventions: Hypertension - evaluation and management  Elmer Sow 12/25/2020, 7:51 PM

## 2020-12-25 NOTE — Progress Notes (Signed)
  Phenytoin Follow-Up Consult Indication: Seizures  No Known Allergies  Patient Measurements: Height: _0  (180.3 cm) Weight: 91.7 kg (202 lb 2.6 oz) IBW/kg (Calculated) : 75.3   Body mass index is 28.2 kg/m.   Vital signs: Temp: 102.6 F (39.2 C) (08/22 0400) Temp Source: Axillary (08/22 0400) BP: 127/74 (08/22 0625) Pulse Rate: 116 (08/22 0625)  Labs: Lab Results  Component Value Date/Time   Albumin 1.6 (L) 12/25/2020 0142   Phenytoin Lvl 5.5 (L) 12/25/2020 0142   Lab Results  Component Value Date   PHENYTOIN 5.5 (L) 12/25/2020   Estimated Creatinine Clearance: 96.3 mL/min (by C-G formula based on SCr of 1.11 mg/dL).   Medications:  Scheduled:   bethanechol  10 mg Per Tube QID   chlorhexidine gluconate (MEDLINE KIT)  15 mL Mouth Rinse BID   Chlorhexidine Gluconate Cloth  6 each Topical Daily   docusate  100 mg Per Tube BID   enoxaparin (LOVENOX) injection  40 mg Subcutaneous Q24H   feeding supplement (PROSource TF)  45 mL Per Tube Daily   free water  30 mL Per Tube Q4H   insulin aspart  0-9 Units Subcutaneous Q4H   lacosamide  200 mg Per Tube Q12H   levETIRAcetam  1,500 mg Per Tube Q12H   mouth rinse  15 mL Mouth Rinse 10 times per day   metoprolol tartrate  25 mg Per Tube BID   pantoprazole sodium  40 mg Per Tube Daily   PHENObarbital  65 mg Intravenous BID   phenytoin  100 mg Per Tube Q8H   polyethylene glycol  17 g Per Tube Daily   sodium chloride flush  10-40 mL Intracatheter Q12H   sodium chloride flush  3 mL Intravenous Q12H   Infusions:   sodium chloride Stopped (12/25/20 0558)   sodium chloride 75 mL/hr at 12/25/20 0812   sodium chloride     ceFEPime (MAXIPIME) IV 200 mL/hr at 12/25/20 0600   dexmedetomidine (PRECEDEX) IV infusion 0.5 mcg/kg/hr (12/25/20 0600)   feeding supplement (VITAL 1.5 CAL) 1,000 mL (12/25/20 0600)   fentaNYL infusion INTRAVENOUS 50 mcg/hr (12/25/20 0600)   PRN: sodium chloride, acetaminophen, docusate, fentaNYL,  fentaNYL (SUBLIMAZE) injection, glycopyrrolate, hydrALAZINE, labetalol, midazolam, polyethylene glycol, prochlorperazine, sodium chloride flush, sodium chloride flush  Assessment:  8/13 DPH lvl < 2.5, albumin 3.6 8/14 DPH lvl 15 8/15 DPH lvl 14.8, albumin 2.7 >> corrected to 23  mcg/mL. 8/16 DPH lvl 11.8, albumin 2.7 >> corrected to 18 8/17 DPH lvl 11.2, albumin 2.0>>corrected to 22   8/18 DPH lvl 11.2, albumin 1.7>>corrected to 25.45 8/19 Free DPH level sent  - ip  8/20 DPH 10.2, albumin 1.6>>corrected to 18.9 8/21 DPH 8.1, albumin 1.6>>corrected to 15 8/22 DPH 5.5, albumin 1.6>>corrected to 13    8/16 phenobarb level 20.6  8/18 phenobarb level 21.6  8/19 Phenobarb 23.7 8/20 Phenobarb 25.1 8/21 Phenobarb 23.2 8/22 Phenobarb 26.4   Seizure activity: None  Goals of care:  Total phenytoin level: 10-20 mcg/ml Free phenytoin level: 1-2 mcg/ml  Plan:  Maintenance dose: DPH 100 per tube q8hr Pharmacy will continue to follow regarding obtaining total phenytoin levels and dose adjustments if corrected phenytoin levels falls below 10.  Adria Dill, PharmD PGY-1 Acute Care Resident  12/25/2020 9:07 AM

## 2020-12-25 NOTE — Progress Notes (Signed)
Wasted 86m fentanyl at bedside with AForestine NaRN.

## 2020-12-25 NOTE — Progress Notes (Addendum)
eLink Physician-Brief Progress Note Patient Name: Luis Hunter DOB: [birth year] MRN: [id number]   Date of Service  12/25/2020  HPI/Events of Note  RN reporting EKG - critical STEMI, ST with 1st degree AV block.  Has 2 ekg shwoing what looks like an artifact.  Camera: Propped up at 80 degree, VS stable. Monitor lead , no STEMI. On fenta and precedex.   K level normal.  Discussed with RN.  Off of eeg.   eICU Interventions  repeat ekg , lying flat. discussed with RN.  Get a stat troponin level.      Intervention Category Intermediate Interventions: Diagnostic test evaluation  Elmer Sow 12/25/2020, 5:30 AM  6:02 3 rd EKG reviewed. Sinus tachy, LVH.  ST up V3, V4. Echo from 11/2019 EF 45%, global hypokinesis.  Follow troponin.  Discussed with bed side CCM docs also.  Going to bed side, activating STEMI protocol.

## 2020-12-25 NOTE — Progress Notes (Signed)
Patient currently receiving ECHO for 1200 CPT.  Will hold.

## 2020-12-25 NOTE — Consult Note (Addendum)
[place] Nurse Consult Note: Patient receiving care in [place] Reason for Consult: new area on coccyx Wound type: MASD/IAD in the intergluteal cleft. Potential to evolve into a pressure injury Pressure Injury POA: NA Measurement: No open areas  Wound bed: Minor irritation in the intergluteal cleft that is moisture associated Drainage (amount, consistency, odor) None Periwound: Intact Dressing procedure/placement/frequency: Clean the sacral area with no rinse cleanser. Pat dry then apply Desitin to the intergluteal cleft area. Apply twice daily or PRN soiling.  ICD-10 CM Codes for Irritant Dermatitis L24A2 - Due to fecal, urinary or dual incontinence   Monitor the wound area(s) for worsening of condition such as: Signs/symptoms of infection, increase in size, development of or worsening of odor, development of pain, or increased pain at the affected locations.   Notify the medical team if any of these develop.  Pressure Injury Prevention Bundle May use any that apply to this patient. Support surfaces (air mattress) chair cushion Kellie Simmering # [phone]) Heel offloading boots Kellie Simmering # [phone]) Turning and Positioning  Measures to reduce shear (draw sheet, knees up) Skin protection Products (Foam dressing) Moisture management products (Critic-Aid Barrier Cream (Purple top) Sween moisturizing lotion (Pink top in clean supply) Nutrition Management Protection for Medical Devices Routine Skin Assessment   Thank you for the consult. [place] nurse will not follow at this time.   Please re-consult the [place] team if needed.  Cathlean Marseilles Tamala Julian, MSN, RN, [place], Lysle Pearl, [facility] Wound Treatment Associate Pager [phone]

## 2020-12-25 NOTE — H&P (View-Only) (Signed)
Cardiology Consultation:   Patient ID: Luis Hunter MRN: [id number]; DOB: 02/17/1976  Admit date: 12/16/2020 Date of Consult: 12/25/2020  PCP:  [facility]   [facility] Providers Cardiologist:  Tim Gollan, MD PhD   Patient Profile:   Luis Hunter is a 45 y.o. male with a hx of seizure disorder and schizophrenia who is being seen 12/25/2020 for the evaluation of abnormal EKG at the request of Dr. Mohan.  History of Present Illness:   Luis Hunter was admitted to  on 8/13 with recurrent seizures and found to be in status epilepticus.  He is currently intubated and sedated.  History is obtained from the chart and nursing.  This morning, ST elevation was noted on telemetry prompting serial EKGs to be obtained over the last hour showing anterior ST segment elevation in the setting of sinus tachycardia.  Patient was also noted to be febrile at 102.6 at 4 AM with concern for aspiration pneumonia.  Patient has a history of moderately reduced LVEF by echo in 2021.  No ischemia evaluation was performed at that time.   Past Medical History:  Diagnosis Date   Bipolar disorder (HCC)    Depression    Schizophrenia (HCC)    Seizures (HCC)     Past Surgical History:  Procedure Laterality Date   FINGER SURGERY       Home Medications:  Prior to Admission medications   Medication Sig Start Date Zarriah Starkel Date Taking? Authorizing Provider  ARIPiprazole ER (ABILIFY MAINTENA) 400 MG PRSY prefilled syringe Inject 400 mg as directed every 30 (thirty) days.  11/08/15  Yes [provider]  benztropine (COGENTIN) 0.5 MG tablet Take 0.5 mg by mouth 2 (two) times daily. 10/23/20  Yes [provider]  levETIRAcetam (KEPPRA) 750 MG tablet Take 2 tablets (1,500 mg total) by mouth 2 (two) times daily. 10/05/20 01/03/21 Yes Isaacs, Cameron, MD    Inpatient Medications: Scheduled Meds:  bethanechol  10 mg Per Tube QID   chlorhexidine gluconate (MEDLINE KIT)  15  mL Mouth Rinse BID   Chlorhexidine Gluconate Cloth  6 each Topical Daily   docusate  100 mg Per Tube BID   enoxaparin (LOVENOX) injection  40 mg Subcutaneous Q24H   feeding supplement (PROSource TF)  45 mL Per Tube Daily   free water  30 mL Per Tube Q4H   insulin aspart  0-9 Units Subcutaneous Q4H   lacosamide  200 mg Per Tube Q12H   levETIRAcetam  1,500 mg Per Tube Q12H   mouth rinse  15 mL Mouth Rinse 10 times per day   metoprolol tartrate  25 mg Per Tube BID   pantoprazole sodium  40 mg Per Tube Daily   PHENObarbital  65 mg Intravenous BID   phenytoin  100 mg Per Tube Q8H   polyethylene glycol  17 g Per Tube Daily   sodium chloride flush  10-40 mL Intracatheter Q12H   Continuous Infusions:  sodium chloride 10 mL/hr at 12/25/20 0200   ceFEPime (MAXIPIME) IV 2 g (12/25/20 0558)   dexmedetomidine (PRECEDEX) IV infusion 0.7 mcg/kg/hr (12/25/20 0200)   feeding supplement (VITAL 1.5 CAL) 1,000 mL (12/25/20 0600)   fentaNYL infusion INTRAVENOUS 100 mcg/hr (12/25/20 0200)   PRN Meds: acetaminophen, docusate, fentaNYL, fentaNYL (SUBLIMAZE) injection, glycopyrrolate, hydrALAZINE, midazolam, polyethylene glycol, prochlorperazine, sodium chloride flush  Allergies:   No Known Allergies  Social History:   Social History   Tobacco Use   Smoking status: Every Day     Smokeless tobacco: Never  Substance Use Topics   Alcohol use: No    Alcohol/week: 0.0 standard drinks   Drug use: Yes    Types: Marijuana, Cocaine      Family History:   Unable to obtain as the patient is intubated and sedated  ROS:  Unable to obtain as the patient is intubated and sedated.  Physical Exam/Data:   Vitals:   12/25/20 0400 12/25/20 0413 12/25/20 0500 12/25/20 0625  BP:   108/63 127/74  Pulse:   (!) 101 (!) 116  Resp:  (!) 28 (!) 22 (!) 30  Temp: (!) 102.6 F (39.2 C)     TempSrc: Axillary     SpO2:  99% 100% 100%  Weight:      Height:        Intake/Output Summary (Last 24 hours) at 12/25/2020  0652 Last data filed at 12/25/2020 0400 Gross per 24 hour  Intake 2374.24 ml  Output 2815 ml  Net -440.76 ml   Last 3 Weights 12/23/2020 12/21/2020 12/20/2020  Weight (lbs) 202 lb 2.6 oz 191 lb 5.8 oz 183 lb 6.8 oz  Weight (kg) 91.7 kg 86.8 kg 83.2 kg     Body mass index is 28.2 kg/m.  General: Thin man, intubated and sedated. HEENT: Intubated Lymph: no adenopathy Neck: No obvious JVD, though evaluation is limited by support devices. Endocrine:  No thryomegaly Vascular: No carotid bruits; FA pulses 2+ bilaterally without bruits  Cardiac: Tachycardic but regular without murmurs, rubs, or gallops. Lungs: Clear anteriorly without wheezes or crackles. Abd: soft, nontender, no hepatomegaly.  Rectal tube in place with brown stool. Ext: no edema Musculoskeletal:  No deformities.  Unable to assess strength. Skin: warm and dry  Neuro: Intubated and sedated. Psych: Unable to assess as the patient is intubated and sedated.  EKG:  The EKG was personally reviewed and demonstrates: Sinus tachycardia, LVH, and 2 mm anterior ST elevation.  Relevant CV Studies: TTE (11/19/2019): Normal LV size with moderate LVH.  LVEF 40-45% with global hypokinesis.  Normal RV size and function.  Normal biatrial size.  No significant valvular abnormality.  Laboratory Data:  High Sensitivity Troponin:   Recent Labs  Lab 12/25/20 0601  TROPONINIHS 73*     Chemistry Recent Labs  Lab 12/23/20 0500 12/24/20 0520 12/25/20 0142  NA 140 144 145  K 4.2 4.3 4.6  CL 109 111 112*  CO2 25 27 25  GLUCOSE 141* 141* 109*  BUN 27* 32* 37*  CREATININE 1.22 1.06 1.11  CALCIUM 7.6* 7.9* 7.9*  GFRNONAA >60 >60 >60  ANIONGAP 6 6 8    Recent Labs  Lab 12/21/20 0440 12/22/20 0351 12/23/20 0500  PROT 4.8* 4.9* 5.3*  ALBUMIN 1.7* 1.6* 1.6*  AST 64* 73* 68*  ALT 30 38 45*  ALKPHOS 67 95 104  BILITOT 0.9 1.0 1.0   Hematology Recent Labs  Lab 12/23/20 0500 12/24/20 0520 12/25/20 0142  WBC 15.4* 18.6* 23.5*   RBC 3.45* 3.29* 3.36*  HGB 10.2* 9.7* 9.9*  HCT 29.4* 28.3* 29.3*  MCV 85.2 86.0 87.2  MCH 29.6 29.5 29.5  MCHC 34.7 34.3 33.8  RDW 15.2 14.9 14.9  PLT 178 171 185   BNPNo results for input(s): BNP, PROBNP in the last 168 hours.  DDimer No results for input(s): DDIMER in the last 168 hours.   Radiology/Studies:  MR BRAIN W WO CONTRAST  Result Date: 12/22/2020 CLINICAL DATA:  Seizure EXAM: MRI HEAD WITHOUT AND WITH CONTRAST TECHNIQUE: Multiplanar, multiecho pulse   sequences of the brain and surrounding structures were obtained without and with intravenous contrast. CONTRAST:  8mL GADAVIST GADOBUTROL 1 MMOL/ML IV SOLN COMPARISON:  CT head 12/16/2020 FINDINGS: Brain: There is no evidence of acute intracranial hemorrhage, extra-axial fluid collection, or infarct. There is no parenchymal signal abnormality. The ventricles are not enlarged. There is no midline shift. No mass lesion is identified. There is no abnormal enhancement. The hippocampal formations are normal and symmetric. There is no structural or migrational abnormality. Vascular: Normal flow voids. Skull and upper cervical spine: Normal marrow signal. Sinuses/Orbits: There is complete opacification of the right maxillary sinus with mucosal thickening throughout the remainder of the paranasal sinuses. The globes and orbits are unremarkable. Other: Endotracheal and enteric catheters are noted. There are bilateral mastoid effusions likely related to instrumentation. IMPRESSION: No acute intracranial pathology or epileptogenic focus identified. Electronically Signed   By: Peter  Noone M.D.   On: 12/22/2020 15:33   DG Chest Port 1 View  Result Date: 12/24/2020 CLINICAL DATA:  Acute respiratory failure. Endotracheally intubated. EXAM: PORTABLE CHEST 1 VIEW COMPARISON:  12/24/2020 FINDINGS: The endotracheal tube and nasogastric tube remain in appropriate position. The left jugular central venous catheter has been removed since previous study. No  evidence of pneumothorax. Heart size is stable. Airspace disease is again seen in both lower lungs, right side greater than left, without significant change. IMPRESSION: No significant change in bilateral lower lung airspace disease, right side greater than left. Electronically Signed   By: John A Stahl M.D.   On: 12/24/2020 15:23   DG Chest Port 1 View  Result Date: 12/24/2020 CLINICAL DATA:  Respiratory failure EXAM: PORTABLE CHEST 1 VIEW COMPARISON:  12/20/2020 FINDINGS: Endotracheal tube terminates 3.7 cm above carina. Nasogastric tube is looped in the stomach with the tip in the proximal stomach. Left internal jugular line tip at mid SVC. mild cardiomegaly. No pleural effusion or pneumothorax. Right greater than left base airspace disease is mildly progressive. Developing inferior right upper lobe airspace disease as well. IMPRESSION: Worsening aeration, favoring progressive right greater than left pneumonia or aspiration. Nasogastric tube looped with tip in proximal stomach. Consider advancement. Electronically Signed   By: Kyle  Talbot M.D.   On: 12/24/2020 11:48     Assessment and Plan:   Anterior STEMI: Unable to obtain any history from the patient as he is intubated and sedated.  Has a history of moderately reduced LVEF by echo in 11/2019 but otherwise no known cardiac history.  ST elevation was noted on telemetry today.  EKG shows anterior ST elevation in the setting of sinus tachycardia.  While this could represent abnormal repolarization, given the patient's critical illness, I think it would be reasonable to perform emergent coronary angiography to exclude significant CAD.  I have discussed this with the patient's sister by phone, who has provided informed consent on his behalf.  Further recommendations to follow catheterization.  Shared Decision Making/Informed Consent The risks [stroke (1 in 1000), death (1 in 1000), kidney failure [usually temporary] (1 in 500), bleeding (1 in 200),  allergic reaction [possibly serious] (1 in 200)], benefits (diagnostic support and management of coronary artery disease) and alternatives of a cardiac catheterization were discussed in detail with Luis Hunter and he is willing to proceed.   Fever: Potentially due to aspiration pneumonia.  Ongoing management per critical care team.  Status epilepticus: Per neurology and CCM.  For questions or updates, please contact [facility] Please consult [email or web address] for contact info under      Signed, Sheilah Rayos, MD  12/25/2020 6:52 AM  

## 2020-12-26 DIAGNOSIS — R000 Tachycardia, unspecified: Secondary | ICD-10-CM

## 2020-12-26 DIAGNOSIS — G9340 Encephalopathy, unspecified: Secondary | ICD-10-CM

## 2020-12-26 LAB — GLUCOSE, CAPILLARY
Glucose-Capillary: 138 mg/dL — ABNORMAL HIGH (ref 70–99)
Glucose-Capillary: 119 mg/dL — ABNORMAL HIGH (ref 70–99)
Glucose-Capillary: 137 mg/dL — ABNORMAL HIGH (ref 70–99)
Glucose-Capillary: 124 mg/dL — ABNORMAL HIGH (ref 70–99)
Glucose-Capillary: 115 mg/dL — ABNORMAL HIGH (ref 70–99)

## 2020-12-26 LAB — PHENYTOIN LEVEL, TOTAL: Phenytoin Lvl: 2.9 ug/mL — ABNORMAL LOW (ref 10.0–20.0)

## 2020-12-26 LAB — PHENOBARBITAL LEVEL: Phenobarbital: 26.4 ug/mL (ref 15.0–30.0)

## 2020-12-26 MED ORDER — PHENYTOIN 50 MG PO CHEW
100.0000 mg | CHEWABLE_TABLET | Freq: Four times a day (QID) | ORAL | Status: DC
  Administered 2020-12-26 – 2020-12-27 (×4): 100 mg
  Filled 2020-12-26 (×5): qty 2

## 2020-12-26 MED ORDER — FENTANYL CITRATE (PF) 100 MCG/2ML IJ SOLN
50.0000 ug | Freq: Once | INTRAMUSCULAR | Status: AC
  Administered 2020-12-26: 50 ug via INTRAVENOUS

## 2020-12-26 MED ORDER — FENTANYL 2500MCG IN NS 250ML (10MCG/ML) PREMIX INFUSION: 50.0000 ug/h | INTRAVENOUS | Status: DC

## 2020-12-26 MED ORDER — METOPROLOL TARTRATE 25 MG PO TABS
50.0000 mg | ORAL_TABLET | Freq: Two times a day (BID) | ORAL | Status: DC
  Administered 2020-12-26 – 2020-12-28 (×4): 50 mg
  Filled 2020-12-26 (×4): qty 2

## 2020-12-26 MED ORDER — MIDAZOLAM-SODIUM CHLORIDE 100-0.9 MG/100ML-% IV SOLN
2.0000 mg/h | INTRAVENOUS | Status: DC
  Administered 2020-12-26: 2 mg/h via INTRAVENOUS

## 2020-12-26 MED ORDER — AMLODIPINE BESYLATE 10 MG PO TABS
10.0000 mg | ORAL_TABLET | Freq: Every day | ORAL | Status: DC
  Administered 2020-12-26 – 2021-01-05 (×11): 10 mg
  Filled 2020-12-26 (×11): qty 1

## 2020-12-26 MED ORDER — METOPROLOL TARTRATE 5 MG/5ML IV SOLN
2.5000 mg | Freq: Once | INTRAVENOUS | Status: AC
  Administered 2020-12-26: 2.5 mg via INTRAVENOUS
  Filled 2020-12-26: qty 5

## 2020-12-26 MED ORDER — FENTANYL BOLUS VIA INFUSION
50.0000 ug | INTRAVENOUS | Status: DC | PRN
  Administered 2020-12-27: 100 ug via INTRAVENOUS
  Filled 2020-12-26: qty 100

## 2020-12-26 NOTE — Progress Notes (Addendum)
Increased work of breathing  Diaphoretic, tachycardic, tachypneic  Fentanyl Versed started   MRI head 8/19 negative CT at 8/13 negative

## 2020-12-26 NOTE — Progress Notes (Signed)
Pt appears to have seizure activity with increased BP and asynchrony with vent. Administered versed '4mg'$  and hydralazine '10mg'$  per order with no change. [place] notified. Awaiting orders.

## 2020-12-26 NOTE — Progress Notes (Signed)
Progress Note  Patient Name: Luis Hunter Date of Encounter: 12/26/2020  [facility] Cardiologist: Rockey Situ  Subjective   Pt intubated and sedated.   Inpatient Medications    Scheduled Meds:  chlorhexidine gluconate (MEDLINE KIT)  15 mL Mouth Rinse BID   Chlorhexidine Gluconate Cloth  6 each Topical Daily   docusate  100 mg Per Tube BID   enoxaparin (LOVENOX) injection  40 mg Subcutaneous Q24H   feeding supplement (PROSource TF)  45 mL Per Tube Daily   free water  30 mL Per Tube Q4H   lacosamide  100 mg Per Tube Q12H   levETIRAcetam  1,500 mg Per Tube Q12H   liver oil-zinc oxide   Topical BID   mouth rinse  15 mL Mouth Rinse 10 times per day   metoprolol tartrate  25 mg Per Tube BID   pantoprazole sodium  40 mg Per Tube Daily   PHENObarbital  65 mg Intravenous BID   phenytoin  100 mg Per Tube Q8H   polyethylene glycol  17 g Per Tube Daily   sodium chloride flush  10-40 mL Intracatheter Q12H   sodium chloride flush  3 mL Intravenous Q12H   Continuous Infusions:  sodium chloride 10 mL/hr at 12/26/20 0700   sodium chloride     ceFEPime (MAXIPIME) IV Stopped (12/26/20 0536)   dexmedetomidine (PRECEDEX) IV infusion Stopped (12/25/20 0947)   feeding supplement (VITAL 1.5 CAL) 1,000 mL (12/25/20 2355)   fentaNYL infusion INTRAVENOUS 50 mcg/hr (12/25/20 0600)   PRN Meds: sodium chloride, acetaminophen, docusate, fentaNYL, fentaNYL (SUBLIMAZE) injection, glycopyrrolate, hydrALAZINE, midazolam, polyethylene glycol, prochlorperazine, sodium chloride flush, sodium chloride flush   Vital Signs    Vitals:   12/26/20 0930 12/26/20 1000 12/26/20 1020 12/26/20 1100  BP: (!) 168/109  (!) 168/110 (!) 166/95  Pulse: (!) 150 (!) 148  (!) 143  Resp: (!) 34 (!) 30  (!) 33  Temp:      TempSrc:      SpO2: 99% 99%  100%  Weight:      Height:        Intake/Output Summary (Last 24 hours) at 12/26/2020 1110 Last data filed at 12/26/2020 1100 Gross per 24 hour  Intake 3286.09 ml   Output 3053.1 ml  Net 232.99 ml   Last 3 Weights 12/26/2020 12/23/2020 12/21/2020  Weight (lbs) 191 lb 5.8 oz 202 lb 2.6 oz 191 lb 5.8 oz  Weight (kg) 86.8 kg 91.7 kg 86.8 kg      Telemetry    Sinus tachycardia - Personally Reviewed  ECG     - Personally Reviewed  Physical Exam    GEN: Intubated, sedated Neck: No JVD Cardiac: Tachycardic, no murmurs, rubs, or gallops.  Respiratory: Mechanical BS bilaterally. GI: Soft, nontender, non-distended  MS: No LE edema; No deformity. Neuro:  Nonfocal  Psych: Normal affect   Labs    High Sensitivity Troponin:   Recent Labs  Lab 12/25/20 0601  TROPONINIHS 73*      Chemistry Recent Labs  Lab 12/21/20 0440 12/21/20 1101 12/22/20 0351 12/23/20 0500 12/24/20 0520 12/25/20 0142  NA 139   < > 142 140 144 145  K 3.8   < > 4.1 4.2 4.3 4.6  CL 110  --  111 109 111 112*  CO2 21*  --  _0 GLUCOSE 167*  --  182* 141* 141* 109*  BUN 32*  --  27* 27* 32* 37*  CREATININE 1.70*  --  1.35*  1.22 1.06 1.11  CALCIUM 7.5*  --  7.6* 7.6* 7.9* 7.9*  PROT 4.8*  --  4.9* 5.3*  --   --   ALBUMIN 1.7*  --  1.6* 1.6*  --  1.6*  AST 64*  --  73* 68*  --   --   ALT 30  --  38 45*  --   --   ALKPHOS 67  --  95 104  --   --   BILITOT 0.9  --  1.0 1.0  --   --   GFRNONAA 50*  --  >60 >60 >60 >60  ANIONGAP 8  --  _0 < > = values in this interval not displayed.     Hematology Recent Labs  Lab 12/23/20 0500 12/24/20 0520 12/25/20 0142  WBC 15.4* 18.6* 23.5*  RBC 3.45* 3.29* 3.36*  HGB 10.2* 9.7* 9.9*  HCT 29.4* 28.3* 29.3*  MCV 85.2 86.0 87.2  MCH 29.6 29.5 29.5  MCHC 34.7 34.3 33.8  RDW 15.2 14.9 14.9  PLT 178 171 185    BNPNo results for input(s): BNP, PROBNP in the last 168 hours.   DDimer No results for input(s): DDIMER in the last 168 hours.   Radiology    CARDIAC CATHETERIZATION  Result Date: 12/25/2020 Conclusions: No angiographically significant coronary artery disease.  ST elevations on EKG most  likely represent abnormal repolarization in the setting of tachycardia. Normal left ventricular systolic function with upper normal filling pressure. Recommendations: Return to 26M-ICU for ongoing management. Primary prevention of coronary artery disease. Follow-up echocardiogram. Nelva Bush, MD [facility]  DG Chest Port 1 View  Result Date: 12/25/2020 CLINICAL DATA:  Respiratory failure EXAM: PORTABLE CHEST 1 VIEW COMPARISON:  12/24/2020 FINDINGS: Endotracheal tube tip 4 cm above the carina. Orogastric or nasogastric tube enters the stomach. Widespread patchy bilateral infiltrates consistent with pneumonia appear similar allowing for technical differences. Question small areas of cavitation, particularly on the right. No worsening or new finding since yesterday. IMPRESSION: Persistent bilateral pulmonary infiltrates, lower lobe predominant. No worsening or new finding since yesterday. Electronically Signed   By: Nelson Chimes M.D.   On: 12/25/2020 07:51   DG Chest Port 1 View  Result Date: 12/24/2020 CLINICAL DATA:  Acute respiratory failure. Endotracheally intubated. EXAM: PORTABLE CHEST 1 VIEW COMPARISON:  12/24/2020 FINDINGS: The endotracheal tube and nasogastric tube remain in appropriate position. The left jugular central venous catheter has been removed since previous study. No evidence of pneumothorax. Heart size is stable. Airspace disease is again seen in both lower lungs, right side greater than left, without significant change. IMPRESSION: No significant change in bilateral lower lung airspace disease, right side greater than left. Electronically Signed   By: Marlaine Hind M.D.   On: 12/24/2020 15:23   ECHOCARDIOGRAM COMPLETE  Result Date: 12/25/2020    ECHOCARDIOGRAM REPORT   Patient Name:   Luis Hunter Date of Exam: 12/25/2020 Medical Rec #:  [id number]     Height:       71.0 in Accession #:    [id number]    Weight:       202.2 lb Date of Birth:  1976-01-17     BSA:          2.118 m  Patient Age:    45 years      BP:           151/93 mmHg Patient Gender: M  HR:           113 bpm. Exam Location:  Inpatient Procedure: 2D Echo, Cardiac Doppler and Color Doppler Indications:    R94.31 Abnormal EKG  History:        Patient has prior history of Echocardiogram examinations, most                 recent 11/19/2019. Abnormal ECG. Seizures. Drug abuse.                 Schizophrenia. Bipolar. Respiratory Failure.  Sonographer:    Merrie Roof RDCS Referring Phys: [place]  1. Technically difficult study. Left ventricular ejection fraction, by estimation, is 50 to 55%. The left ventricle has low normal function. The left ventricle has no regional wall motion abnormalities. There is severe left ventricular hypertrophy. Left  ventricular diastolic parameters are indeterminate.  2. Right ventricular systolic function is normal. The right ventricular size is mildly enlarged. Tricuspid regurgitation signal is inadequate for assessing PA pressure.  3. A small pericardial effusion is present.  4. The mitral valve is normal in structure. No evidence of mitral valve regurgitation.  5. The aortic valve was not well visualized. Aortic valve regurgitation is not visualized. No aortic stenosis is present. FINDINGS  Left Ventricle: Left ventricular ejection fraction, by estimation, is 50 to 55%. The left ventricle has low normal function. The left ventricle has no regional wall motion abnormalities. The left ventricular internal cavity size was normal in size. There is severe left ventricular hypertrophy. Left ventricular diastolic parameters are indeterminate. Right Ventricle: The right ventricular size is mildly enlarged. Right vetricular wall thickness was not well visualized. Right ventricular systolic function is normal. Tricuspid regurgitation signal is inadequate for assessing PA pressure. Left Atrium: Left atrial size was normal in size. Right Atrium: Right atrial size was normal  in size. Pericardium: A small pericardial effusion is present. Mitral Valve: The mitral valve is normal in structure. No evidence of mitral valve regurgitation. Tricuspid Valve: The tricuspid valve is normal in structure. Tricuspid valve regurgitation is trivial. Aortic Valve: The aortic valve was not well visualized. Aortic valve regurgitation is not visualized. No aortic stenosis is present. Aortic valve mean gradient measures 4.0 mmHg. Aortic valve peak gradient measures 8.5 mmHg. Aortic valve area, by VTI measures 4.04 cm. Pulmonic Valve: The pulmonic valve was not well visualized. Pulmonic valve regurgitation is not visualized. Aorta: The aortic root is normal in size and structure. IAS/Shunts: The interatrial septum was not well visualized.  LEFT VENTRICLE PLAX 2D LVIDd:         6.80 cm LVIDs:         4.90 cm LV PW:         1.00 cm LV IVS:        1.00 cm LVOT diam:     2.30 cm LV SV:         70 LV SV Index:   33 LVOT Area:     4.15 cm  LV Volumes (MOD) LV vol d, MOD A2C: 86.3 ml LV vol d, MOD A4C: 102.0 ml LV vol s, MOD A2C: 31.3 ml LV vol s, MOD A4C: 33.4 ml LV SV MOD A2C:     55.0 ml LV SV MOD A4C:     102.0 ml LV SV MOD BP:      62.7 ml RIGHT VENTRICLE RV Basal diam:  3.50 cm RV S prime:     13.90 cm/s TAPSE (M-mode): 1.6 cm LEFT ATRIUM  Index       RIGHT ATRIUM           Index LA diam:      2.90 cm 1.37 cm/m  RA Area:     16.40 cm LA Vol (A4C): 40.4 ml 19.07 ml/m RA Volume:   47.50 ml  22.42 ml/m  AORTIC VALVE AV Area (Vmax):    3.41 cm AV Area (Vmean):   3.53 cm AV Area (VTI):     4.04 cm AV Vmax:           146.00 cm/s AV Vmean:          95.700 cm/s AV VTI:            0.174 m AV Peak Grad:      8.5 mmHg AV Mean Grad:      4.0 mmHg LVOT Vmax:         120.00 cm/s LVOT Vmean:        81.400 cm/s LVOT VTI:          0.169 m LVOT/AV VTI ratio: 0.97  AORTA Ao Root diam: 3.20 cm  SHUNTS Systemic VTI:  0.17 m Systemic Diam: 2.30 cm Oswaldo Milian MD Electronically signed by Oswaldo Milian MD Signature Date/Time: 12/25/2020/4:04:03 PM    Final     Cardiac Studies   See above.   Patient Profile     45 y.o. male with history of seizure disorder and schizophrenia admitted with recurrent seizures and status epilepticus. EKG with ST elevation. Cardiology consulted emergently 12/25/20. Cardiac cath with no obstructive CAD. LV function is normal by LV gram an echo. Severe LVH on echo which is felt to be the cause of his abnormal EKG.   Assessment & Plan    Abnormal EKG with changes likely due to LVH No obstructive CAD by cath Sinus tachycardia in the setting of acute illness  No new recommendations from a cardiac standpoint. We will sign off. Please call with questions.   For questions or updates, please contact [place] Please consult [email or web address] for contact info under        Signed, Lauree Chandler, MD  12/26/2020, 11:10 AM

## 2020-12-26 NOTE — Progress Notes (Signed)
Called the patient's listed contact to update  Luis Hunter [phone]  Phone rang multiple times without pickup or directing to voicemail

## 2020-12-26 NOTE — Progress Notes (Signed)
Subjective: Had a seizure-like episode overnight.  ROS: Unable to obtain due to poor mental status  Examination  Vital signs in last 24 hours: Temp:  [99.3 F (37.4 C)-102 F (38.9 C)] 101.7 F (38.7 C) (08/23 1111) Pulse Rate:  [111-150] 143 (08/23 1100) Resp:  [26-36] 33 (08/23 1100) BP: (148-177)/(91-117) 166/95 (08/23 1100) SpO2:  [99 %-100 %] 100 % (08/23 1100) FiO2 (%):  [40 %] 40 % (08/23 0817) Weight:  [86.8 kg] 86.8 kg (08/23 0500)  General: lying in bed, NAD CVS: pulse-normal rate and rhythm RS: intubated, CTAB Extremities: normal, warm Neuro: comatose, does not open eyes to noxious stimuli, PERRLA, corneal reflex present, gag reflex absent, does not withdraw to noxious stimuli in all extremities   Basic Metabolic Panel: Recent Labs  Lab 12/20/20 0448 12/20/20 1058 12/21/20 0440 12/21/20 1101 12/21/20 1507 12/22/20 0351 12/23/20 0500 12/24/20 0520 12/25/20 0142  NA 145   < > 139   < > 141 142 140 144 145  K 3.7   < > 3.8   < > 3.6 4.1 4.2 4.3 4.6  CL 114*  --  110  --   --  111 109 111 112*  CO2 22  --  21*  --   --  '24 25 27 25  '$ GLUCOSE 123*  --  167*  --   --  182* 141* 141* 109*  BUN 29*  --  32*  --   --  27* 27* 32* 37*  CREATININE 1.56*  --  1.70*  --   --  1.35* 1.22 1.06 1.11  CALCIUM 7.6*  --  7.5*  --   --  7.6* 7.6* 7.9* 7.9*  MG 1.9  --  2.4  --   --   --   --   --   --   PHOS 2.7  --  4.3  --   --   --   --   --   --    < > = values in this interval not displayed.    CBC: Recent Labs  Lab 12/21/20 0440 12/21/20 1101 12/21/20 1507 12/22/20 0351 12/23/20 0500 12/24/20 0520 12/25/20 0142  WBC 17.7*  --   --  19.5* 15.4* 18.6* 23.5*  NEUTROABS 15.7*  --   --   --  11.5*  --   --   HGB 11.1*   < > 10.2* 10.8* 10.2* 9.7* 9.9*  HCT 32.5*   < > 30.0* 32.1* 29.4* 28.3* 29.3*  MCV 86.2  --   --  84.5 85.2 86.0 87.2  PLT 222  --   --  198 178 171 185   < > = values in this interval not displayed.     Coagulation Studies: No results for  input(s): LABPROT, INR in the last 72 hours.  Imaging No new brain imaging overnight  ASSESSMENT AND PLAN: 45 year old male with history of epilepsy who presented with status epilepticus in the setting of medication noncompliance.   Status epilepticus (resolved) Epilepsy with breakthrough seizure Acute encephalopathy, postictal and medication induced Aspiration pneumonia with Serratia - Continues to be comatose.  Has been seizure-free for almost a week.  Has been on minimal sedation for vent dyssynchrony.  Also reducing antiseizure medications to minimize sedation.  MRI brain did not show any evidence of acute abnormality.  UDS did show cocaine and THC on 12/12/2020.  Could be due to infection.  Only 2 more doses of cefepime left   Recommendations -Corrected phenytoin subtherapeutic  at 6. Increase phenytoin to 100 mg every 6 hours -Continue Keppra to 1500 mg twice daily, Vimpat 100 mg twice daily, phenobarbital 65 mg twice daily  -Minimize sedation to help with neuro prognostication -Continue seizure precautions -As needed IV Ativan 2 mg for clinical seizure-like activity -Management of rest of comorbidities per primary team   I have spent a total of 35  minutes with the patient reviewing hospital notes,  test results, labs and examining the patient as well as establishing an assessment and plan.  > 50% of time was spent in direct patient care.  Zeb Comfort Epilepsy [facility] For questions after 5pm please refer to AMION to reach the Neurologist on call

## 2020-12-26 NOTE — Progress Notes (Signed)
NAME:  Luis Hunter, MRN:  [id number], DOB:  03-29-1976, LOS: 94 ADMISSION DATE:  12/16/2020, CONSULTATION DATE:  12/26/2020  REFERRING MD:  Starleen Blue, EDP [facility], CHIEF COMPLAINT:  seizures, intubated   History of Present Illness:  45 yo male smoker found unresponsive by his wife.  Noted to have multiple seizures witnessed by his wife.  Intubated for airway protection and loaded with keppra.  UDS positive for cocaine and THC.  Transferred from [facility] to [facility] for continuous EEG monitoring.  Pertinent  Medical History  Schizophrenia, Seizures, Bipolar, Depression, Cocaine abuse  Significant Hospital Events:  8/13 intubated, transferred to [facility] 8/16 respiratory aspirate with gram positive cocci, few gram negative rods 8/16 started on unasyn 8/17 unasyn discontinued 8/18 started on zosyn 8/20 vent dyssynchrony >> add precedex; EEG with periodic triphasic morphology and generalized slowing 8/22 cardiac catheterization for STEMI-no significant coronary artery disease discovered 8/23 remains encephalopathic  Interim History / Subjective:   T-max 101.7 Still with significant oral secretions Sedation was weaned, still encephalopathic Tachycardic, tachypneic  Objective   Blood pressure (!) 166/95, pulse (!) 147, temperature (!) 101.7 F (38.7 C), temperature source Oral, resp. rate (!) 36, height '5\' 11"'$  (1.803 m), weight 86.8 kg, SpO2 100 %.    Vent Mode: PRVC FiO2 (%):  [40 %] 40 % Set Rate:  [28 bmp] 28 bmp Vt Set:  [550 mL] 550 mL PEEP:  [8 cmH20] 8 cmH20 Plateau Pressure:  [12 cmH20-24 cmH20] 13 cmH20   Intake/Output Summary (Last 24 hours) at 12/26/2020 1154 Last data filed at 12/26/2020 1100 Gross per 24 hour  Intake 3286.09 ml  Output 3053.1 ml  Net 232.99 ml   Filed Weights   12/21/20 0500 12/23/20 0500 12/26/20 0500  Weight: 86.8 kg 91.7 kg 86.8 kg    Examination:  General -unresponsive Eyes -pupils equal and reactive ENT -endotracheal tube in place Cardiac  -tachycardic, S1-S2 appreciated  chest -mild rhonchi anteriorly Abdomen -bowel sounds appreciated Extremities -no edema, no clubbing Skin - no rashes Neuro -RASS -1  6 L positive since admission Chest x-ray 6/22 with lower lobe infiltrates-unchanged from previous, reviewed by myself  Resolved Hospital Problem list   Hypokalemia, Hypophosphatemia, AKI from ATN 2nd to sepsis  Assessment & Plan:   Acute hypoxic/hypercapnic respiratory failure and ARDS Aspiration pneumonia with Serratia -Will switch to Bactrim -Wake up assessment as tolerated  Fever -Related to Serratia been treated -A-line and central line discontinued 8/21  Status epilepticus Polysubstance abuse History of depression/bipolar disorder/schizophrenia -Continue Keppra, Vimpat, phenobarbital, phenytoin, -Ativan for seizures  Urinary retention -Resolved  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 -Continue to monitor closely -Transfuse per protocol  Remains encephalopathic  Best Practice (right click and "Reselect all SmartList Selections" daily)  Diet/type: tubefeeds DVT prophylaxis: lovenox GI prophylaxis: protonix Code Status:  full code Last date of multidisciplinary goals of care discussion 8/18   Labs    CMP Latest Ref Rng & Units 12/25/2020 12/24/2020 12/23/2020  Glucose 70 - 99 mg/dL 109(H) 141(H) 141(H)  BUN 6 - 20 mg/dL 37(H) 32(H) 27(H)  Creatinine 0.61 - 1.24 mg/dL 1.11 1.06 1.22  Sodium 135 - 145 mmol/L 145 144 140  Potassium 3.5 - 5.1 mmol/L 4.6 4.3 4.2  Chloride 98 - 111 mmol/L 112(H) 111 109  CO2 22 -  32 mmol/L '25 27 25  '$ Calcium 8.9 - 10.3 mg/dL 7.9(L) 7.9(L) 7.6(L)  Total Protein 6.5 - 8.1 g/dL - - 5.3(L)  Total Bilirubin 0.3 - 1.2  mg/dL - - 1.0  Alkaline Phos 38 - 126 U/L - - 104  AST 15 - 41 U/L - - 68(H)  ALT 0 - 44 U/L - - 45(H)    CBC Latest Ref Rng & Units 12/25/2020 12/24/2020 12/23/2020  WBC 4.0 - 10.5 K/uL 23.5(H) 18.6(H) 15.4(H)  Hemoglobin 13.0 - 17.0 g/dL 9.9(L) 9.7(L) 10.2(L)  Hematocrit 39.0 - 52.0 % 29.3(L) 28.3(L) 29.4(L)  Platelets 150 - 400 K/uL 185 171 178    ABG    Component Value Date/Time   PHART 7.440 12/22/2020 1014   PCO2ART 37.3 12/22/2020 1014   PO2ART 78.6 (L) 12/22/2020 1014   HCO3 24.5 12/22/2020 1014   TCO2 24 12/21/2020 1507   ACIDBASEDEF 1.0 12/21/2020 1101   O2SAT 94.6 12/22/2020 1014    CBG (last 3)  Recent Labs    12/25/20 2332 12/26/20 0722 12/26/20 1110  GLUCAP 149* 138* 119*   The patient is critically ill with multiple organ systems failure and requires high complexity decision making for assessment and support, frequent evaluation and titration of therapies, application of advanced monitoring technologies and extensive interpretation of multiple databases. Critical Care Time devoted to patient care services described in this note independent of APP/resident time (if applicable)  is 35 minutes.   Sherrilyn Rist MD [place] [facility] Personal pager: See Amion If unanswered, please page CCM On-call: [phone]

## 2020-12-26 NOTE — Progress Notes (Signed)
eLink Physician-Brief Progress Note Patient Name: LINWOOD FRIIS DOB: 17-Jul-1975 MRN: [id number]   Date of Service  12/26/2020  HPI/Events of Note  Patient with heart rate 130's, sinus tachycardia, BP 137/78.  eICU Interventions  Lopressor 2.5 mg iv x 1 ordered for heart rate of 135.        Frederik Pear 12/26/2020, 9:56 PM

## 2020-12-26 NOTE — Progress Notes (Addendum)
Phenytoin Follow-Up Consult Indication: Seizures  No Known Allergies  Patient Measurements: Height: _0  (180.3 cm) Weight: 86.8 kg (191 lb 5.8 oz) IBW/kg (Calculated) : 75.3   Body mass index is 26.69 kg/m.   Vital signs: Temp: 101.7 F (38.7 C) (08/23 1111) Temp Source: Oral (08/23 1111) BP: 166/95 (08/23 1100) Pulse Rate: 143 (08/23 1100)  Labs: Lab Results  Component Value Date/Time   Phenytoin Lvl 2.9 (L) 12/26/2020 0427   Lab Results  Component Value Date   PHENYTOIN 2.9 (L) 12/26/2020   Estimated Creatinine Clearance: 88.6 mL/min (by C-G formula based on SCr of 1.11 mg/dL).   Medications:  Scheduled:   chlorhexidine gluconate (MEDLINE KIT)  15 mL Mouth Rinse BID   Chlorhexidine Gluconate Cloth  6 each Topical Daily   docusate  100 mg Per Tube BID   enoxaparin (LOVENOX) injection  40 mg Subcutaneous Q24H   feeding supplement (PROSource TF)  45 mL Per Tube Daily   free water  30 mL Per Tube Q4H   lacosamide  100 mg Per Tube Q12H   levETIRAcetam  1,500 mg Per Tube Q12H   liver oil-zinc oxide   Topical BID   mouth rinse  15 mL Mouth Rinse 10 times per day   metoprolol tartrate  25 mg Per Tube BID   pantoprazole sodium  40 mg Per Tube Daily   PHENObarbital  65 mg Intravenous BID   phenytoin  100 mg Per Tube Q6H   polyethylene glycol  17 g Per Tube Daily   sodium chloride flush  10-40 mL Intracatheter Q12H   sodium chloride flush  3 mL Intravenous Q12H   Infusions:   sodium chloride 10 mL/hr at 12/26/20 0700   sodium chloride     ceFEPime (MAXIPIME) IV Stopped (12/26/20 0536)   dexmedetomidine (PRECEDEX) IV infusion Stopped (12/25/20 0947)   feeding supplement (VITAL 1.5 CAL) 1,000 mL (12/25/20 2355)   fentaNYL infusion INTRAVENOUS 50 mcg/hr (12/25/20 0600)   PRN: sodium chloride, acetaminophen, docusate, fentaNYL, fentaNYL (SUBLIMAZE) injection, glycopyrrolate, hydrALAZINE, midazolam, polyethylene glycol, prochlorperazine, sodium chloride flush,  sodium chloride flush  Assessment:  8/13 DPH lvl < 2.5, albumin 3.6 8/14 DPH lvl 15 8/15 DPH lvl 14.8, albumin 2.7 >> corrected to 23  mcg/mL. 8/16 DPH lvl 11.8, albumin 2.7 >> corrected to 18 8/17 DPH lvl 11.2, albumin 2.0>>corrected to 22   8/18 DPH lvl 11.2, albumin 1.7>>corrected to 25.45 8/19 Free DPH 3.5  8/20 DPH 10.2, albumin 1.6>>corrected to 18.9 8/21 DPH 8.1, albumin 1.6>>corrected to 15 8/22 DPH 5.5, albumin 1.6>>corrected to 13  8/23 DPH 2.9, albumin 1.6>> corrected to 6.9   8/16 phenobarb level 20.6  8/18 phenobarb level 21.6  8/19 Phenobarb 23.7 8/20 Phenobarb 25.1 8/21 Phenobarb 23.2 8/22 Phenobarb 26.4  8/22 Phenobarb 26.4   Seizure Activity: Per RN, patient appeared to have some seizure activity 8/23 AM which resolved with 23m versed. Notably, vimpat dose was decreased from 200 BID to 100 BID 8/22, and 8/23 corrected phenytoin was subtherapeutic (6.9). Phenytoin has been trending down, likely as a result of co-administration with TF. Phenytoin was switched from IV on 8/21 which coincides with drop in corrected phenytoin. Discussed with Dr. YHortense Ramal and will increase phenytoin dose to 100 mg Q6H PT while on TF with no load administered.   Goals of care:  Total phenytoin level: 10-20 mcg/ml Free phenytoin level: 1-2 mcg/ml  Plan:  Increase phenytoin to 100 mg Q6H PT (no load)  Pharmacy will continue to follow regarding  obtaining total phenytoin levels and dose adjustments if corrected phenytoin levels falls below 10. F/U daily phenytoin/phenobarb levels  F/U neuro recs  Monitor for seizure activity   Adria Dill, PharmD PGY-1 Acute Care Resident  12/26/2020 11:18 AM

## 2020-12-27 ENCOUNTER — Inpatient Hospital Stay (HOSPITAL_COMMUNITY): Payer: Medicare HMO

## 2020-12-27 DIAGNOSIS — J9602 Acute respiratory failure with hypercapnia: Secondary | ICD-10-CM

## 2020-12-27 DIAGNOSIS — G9340 Encephalopathy, unspecified: Secondary | ICD-10-CM

## 2020-12-27 LAB — CBC WITH DIFFERENTIAL/PLATELET
Abs Immature Granulocytes: 0.84 10*3/uL — ABNORMAL HIGH (ref 0.00–0.07)
Basophils Absolute: 0.1 10*3/uL (ref 0.0–0.1)
Basophils Relative: 0 %
Eosinophils Absolute: 0.3 10*3/uL (ref 0.0–0.5)
Eosinophils Relative: 1 %
HCT: 29.2 % — ABNORMAL LOW (ref 39.0–52.0)
Hemoglobin: 9.6 g/dL — ABNORMAL LOW (ref 13.0–17.0)
Immature Granulocytes: 4 %
Lymphocytes Relative: 6 %
Lymphs Abs: 1.4 10*3/uL (ref 0.7–4.0)
MCH: 29 pg (ref 26.0–34.0)
MCHC: 32.9 g/dL (ref 30.0–36.0)
MCV: 88.2 fL (ref 80.0–100.0)
Monocytes Absolute: 0.9 10*3/uL (ref 0.1–1.0)
Monocytes Relative: 4 %
Neutro Abs: 19.1 10*3/uL — ABNORMAL HIGH (ref 1.7–7.7)
Neutrophils Relative %: 85 %
Platelets: 240 10*3/uL (ref 150–400)
RBC: 3.31 MIL/uL — ABNORMAL LOW (ref 4.22–5.81)
RDW: 14.9 % (ref 11.5–15.5)
WBC: 22.6 10*3/uL — ABNORMAL HIGH (ref 4.0–10.5)
nRBC: 0.1 % (ref 0.0–0.2)

## 2020-12-27 LAB — GLUCOSE, CAPILLARY
Glucose-Capillary: 117 mg/dL — ABNORMAL HIGH (ref 70–99)
Glucose-Capillary: 111 mg/dL — ABNORMAL HIGH (ref 70–99)
Glucose-Capillary: 119 mg/dL — ABNORMAL HIGH (ref 70–99)
Glucose-Capillary: 121 mg/dL — ABNORMAL HIGH (ref 70–99)
Glucose-Capillary: 103 mg/dL — ABNORMAL HIGH (ref 70–99)
Glucose-Capillary: 99 mg/dL (ref 70–99)

## 2020-12-27 LAB — BASIC METABOLIC PANEL
Anion gap: 5 (ref 5–15)
BUN: 37 mg/dL — ABNORMAL HIGH (ref 6–20)
CO2: 27 mmol/L (ref 22–32)
Calcium: 8.1 mg/dL — ABNORMAL LOW (ref 8.9–10.3)
Chloride: 111 mmol/L (ref 98–111)
Creatinine, Ser: 0.97 mg/dL (ref 0.61–1.24)
GFR, Estimated: >60 mL/min (ref >60)
Glucose, Bld: 130 mg/dL — ABNORMAL HIGH (ref 70–99)
Potassium: 4.7 mmol/L (ref 3.5–5.1)
Sodium: 143 mmol/L (ref 135–145)

## 2020-12-27 LAB — TSH: TSH: 2.776 u[IU]/mL (ref 0.350–4.500)

## 2020-12-27 LAB — PHENYTOIN LEVEL, TOTAL: Phenytoin Lvl: <2.5 ug/mL — ABNORMAL LOW (ref 10.0–20.0)

## 2020-12-27 LAB — AMMONIA: Ammonia: 51 umol/L — ABNORMAL HIGH (ref 9–35)

## 2020-12-27 LAB — FOLATE: Folate: 8 ng/mL (ref >5.9)

## 2020-12-27 LAB — PHENOBARBITAL LEVEL: Phenobarbital: 25.9 ug/mL (ref 15.0–30.0)

## 2020-12-27 LAB — VITAMIN B12: Vitamin B-12: 884 pg/mL (ref 180–914)

## 2020-12-27 IMAGING — DX DG CHEST 1V PORT
1 series · 2 of 2 positions shown · non-contrast
Comparison: [DATE].

CLINICAL DATA: Respiratory failure.

EXAM:
PORTABLE CHEST 1 VIEW

[Series 1: chest ap · 0.14mm/px · 2 of 2 slices shown]
[im 1/2]
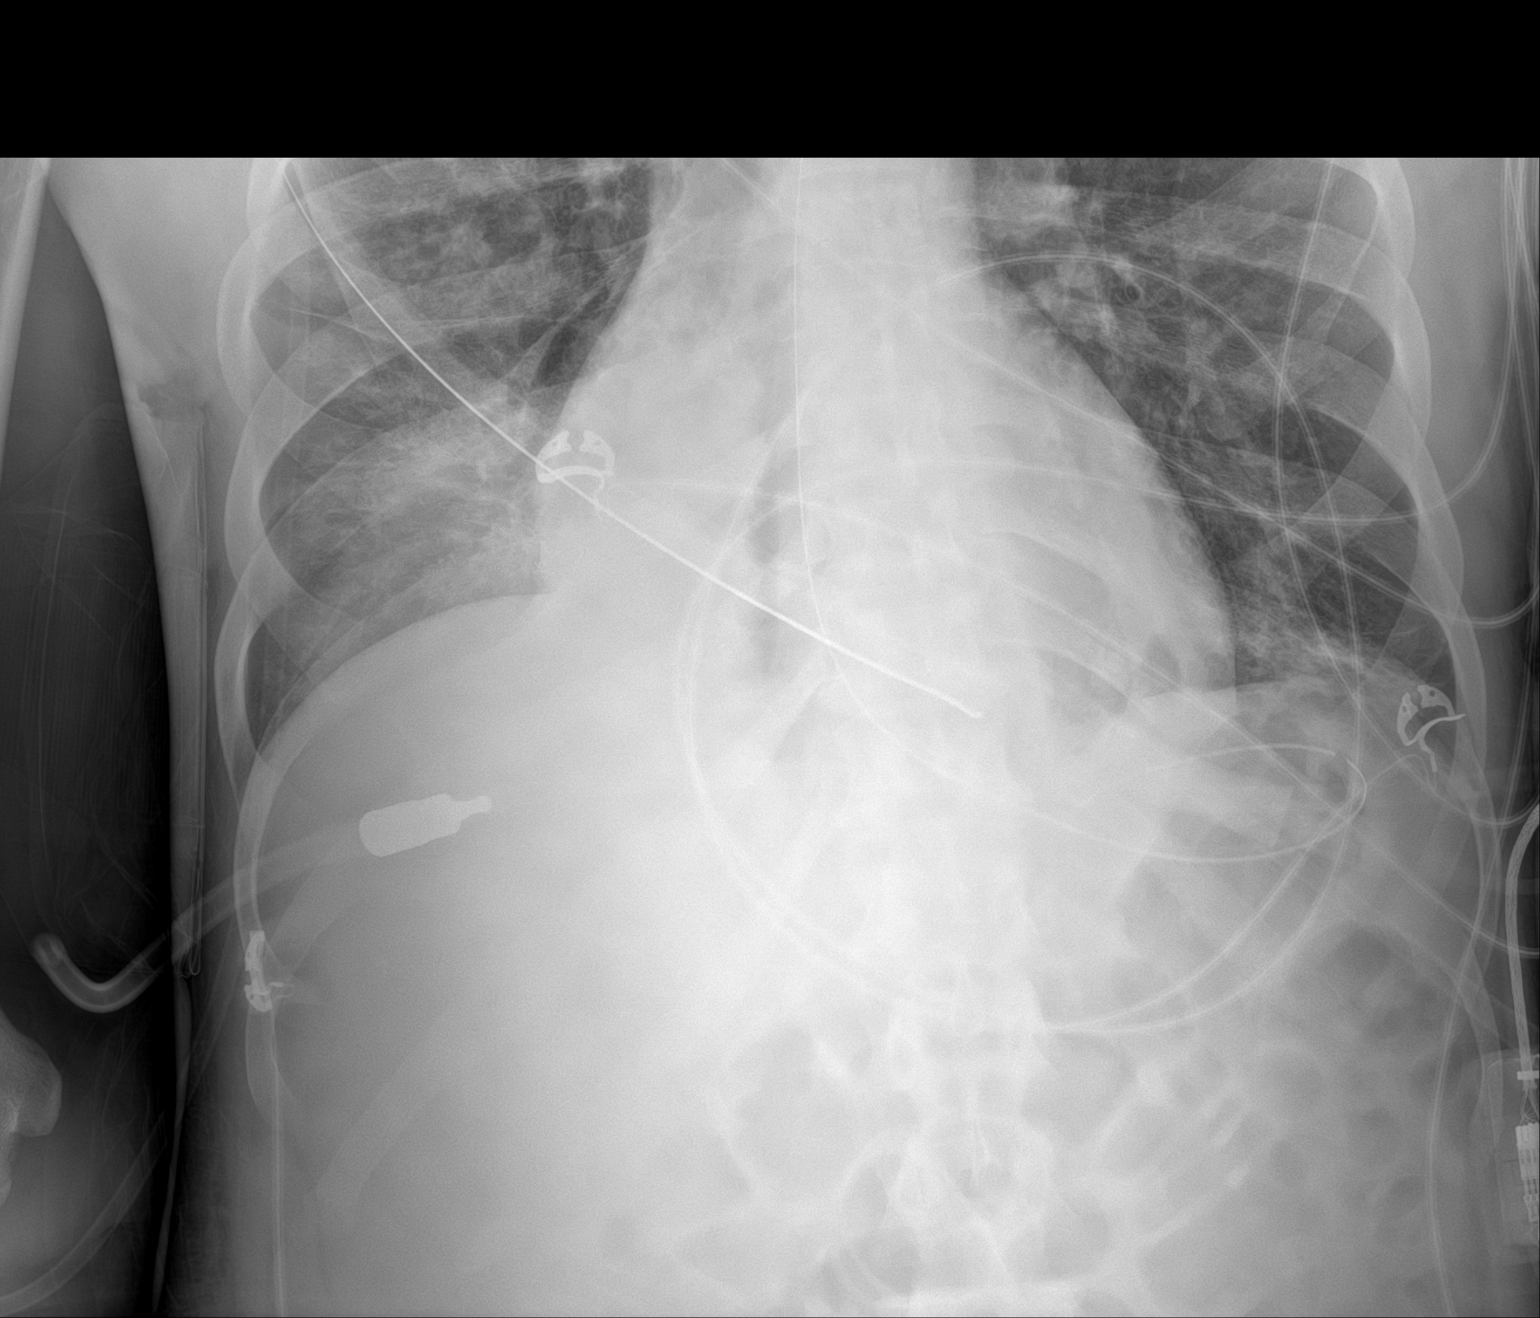
[im 2/2]
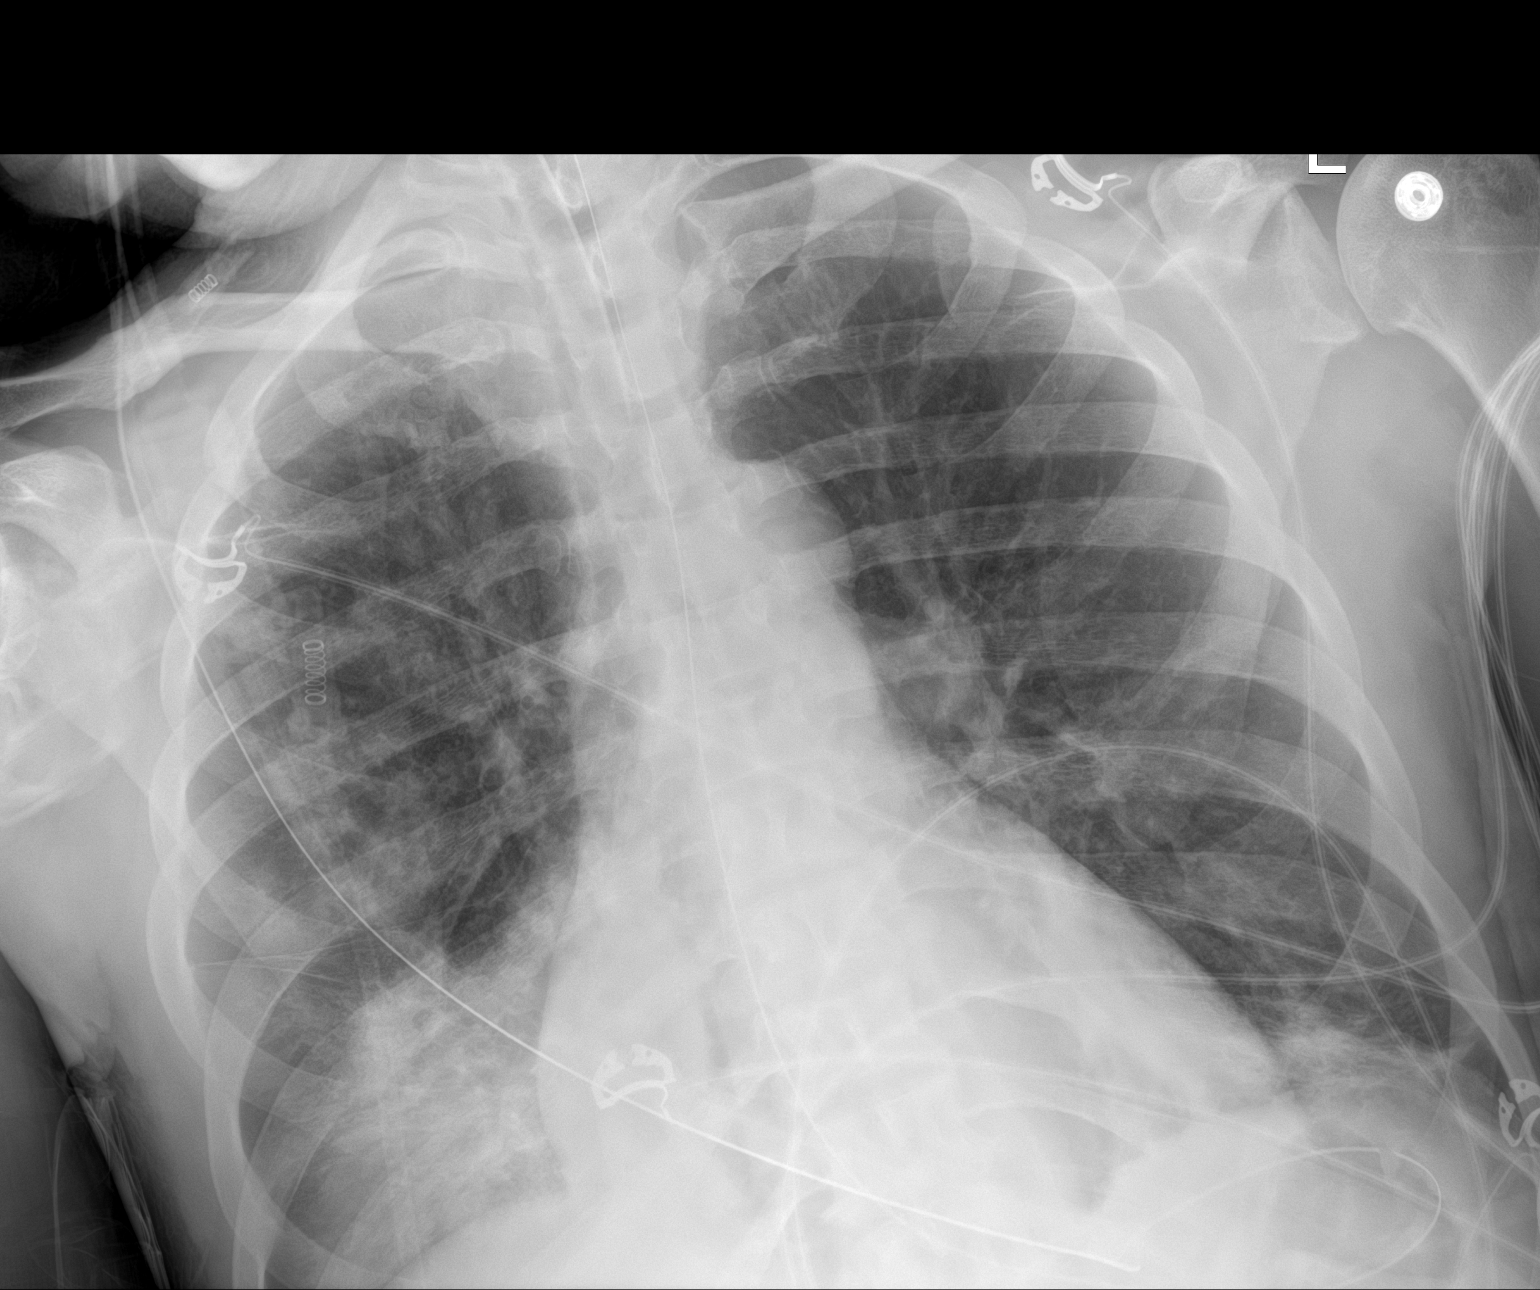

[2 of 2 positions shown; findings below may reference images not displayed]

FINDINGS: Stable cardiomediastinal silhouette. Endotracheal nasogastric tubes
are unchanged in position. No pneumothorax is noted. Stable
bibasilar opacities are noted, right greater than left, concerning
for pneumonia. Bony thorax is unremarkable.
IMPRESSION: Stable support apparatus. Stable bibasilar opacities as described
above.

## 2020-12-27 IMAGING — DX DG ABD PORTABLE 1V
1 series · 1 of 1 positions shown · non-contrast
Comparison: Radiograph [DATE]

CLINICAL DATA: Feeding tube placement.

EXAM:
PORTABLE ABDOMEN - 1 VIEW

[abdomen]
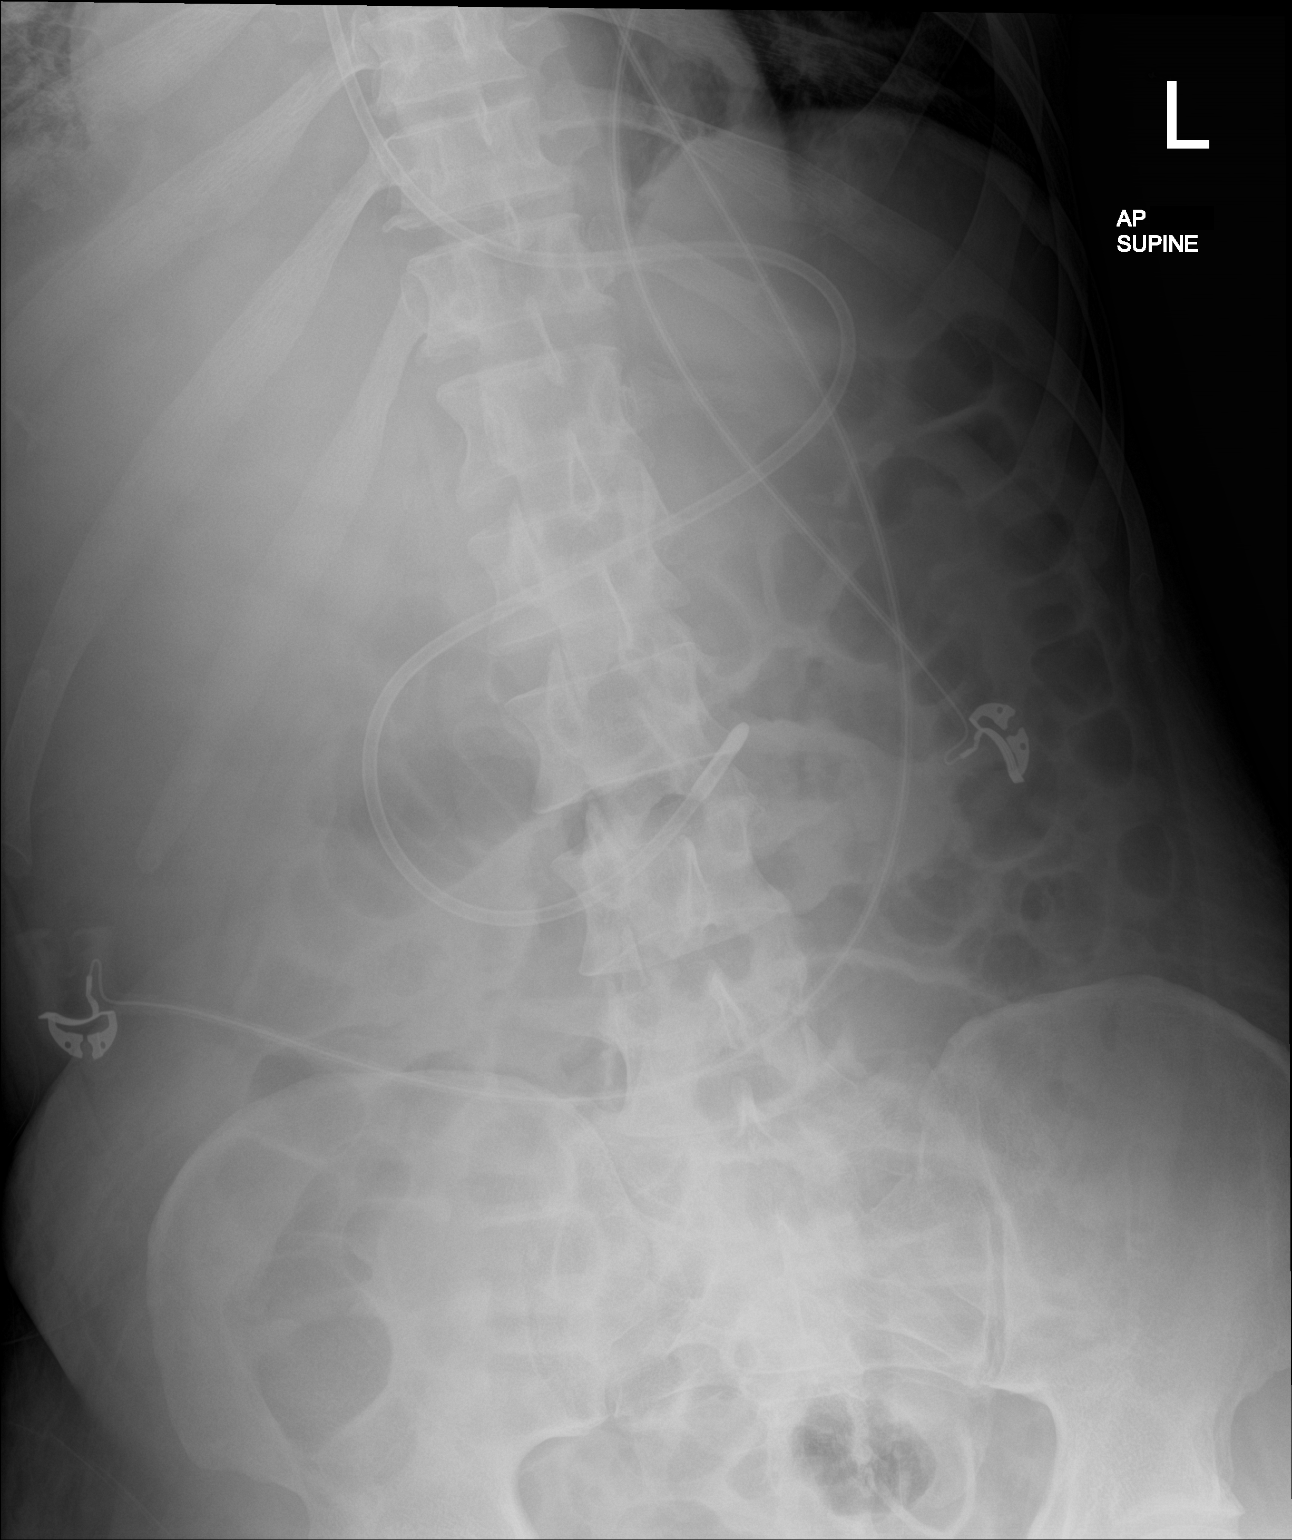

[1 of 1 positions shown; findings below may reference images not displayed]

FINDINGS: Tip of the weighted enteric tube is just to the left of midline in
the region of the distal duodenum. Air throughout mildly prominent
small bowel loops in the central abdomen. Previous gaseous gastric
distension is no longer seen.
IMPRESSION: Tip of the weighted enteric tube in the distal duodenum.

## 2020-12-27 IMAGING — MR MR HEAD W/O CM
12 of 13 series · 44 of 48 positions shown · non-contrast
Comparison: MRI [DATE].  CT [DATE].

CLINICAL DATA: Mental status change of unknown cause. Unresponsive.

EXAM:
MRI HEAD WITHOUT CONTRAST
TECHNIQUE: Multiplanar, multiecho pulse sequences of the brain and surrounding
structures were obtained without intravenous contrast.

[Series 5: DWI · axial · 3.0mm · 0.92mm/px · z∈[-101,+52]mm · 9 of 108 slices shown (1 of 4)]
[im 1/108]
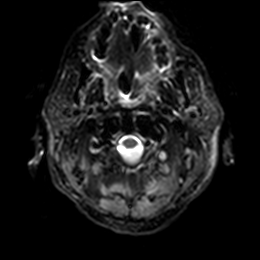
[im 14/108]
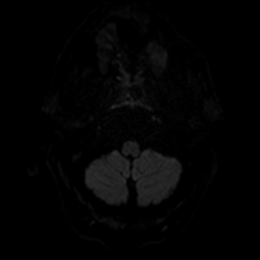
[im 27/108]
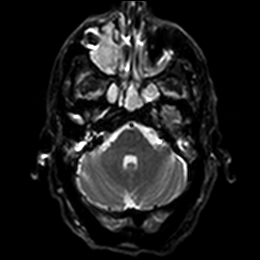
[im 41/108]
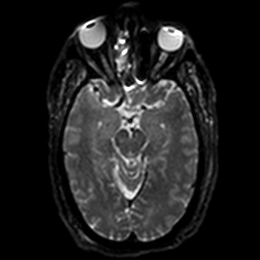
[im 54/108]
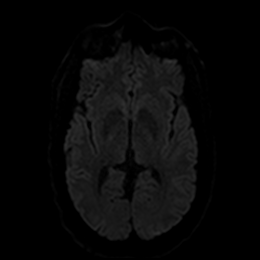
[im 67/108]
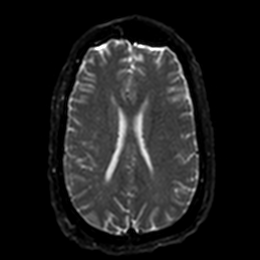
[im 81/108]
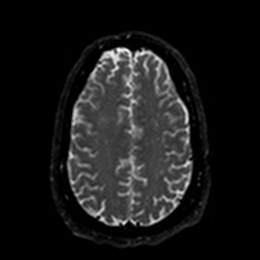
[im 94/108]
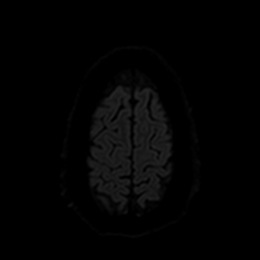
[im 108/108]
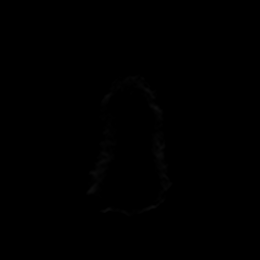

[Series 6: DWI · axial · 3.0mm · 0.92mm/px · z∈[-101,+52]mm · 4 of 52 slices shown (2 of 4)]
[im 1/52]
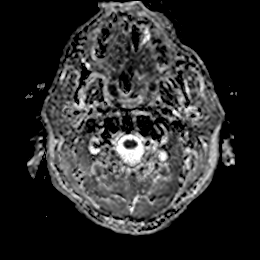
[im 18/52]
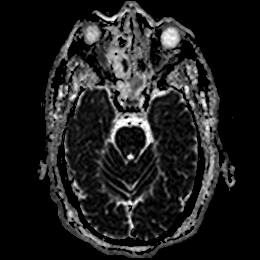
[im 35/52]
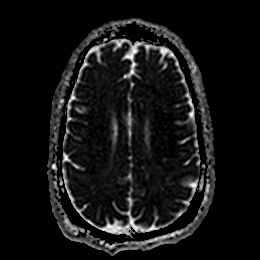
[im 52/52]
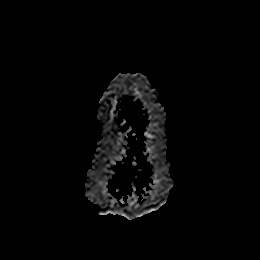

[Series 7: DWI · coronal · 4.0mm · 0.88mm/px · 5 of 72 slices shown (3 of 4)]
[im 1/72]
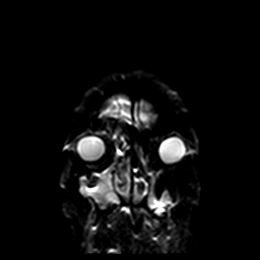
[im 18/72]
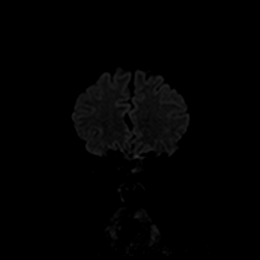
[im 36/72]
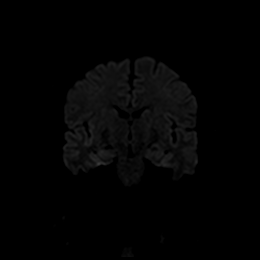
[im 54/72]
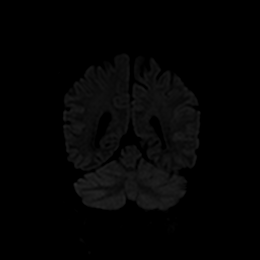
[im 72/72]
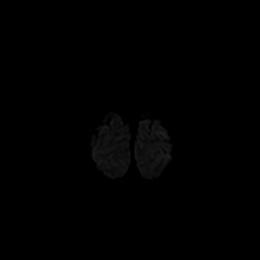

[Series 8: DWI · coronal · 4.0mm · 0.88mm/px · 2 of 35 slices shown (4 of 4)]
[im 1/35]
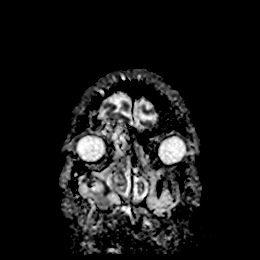
[im 35/35]
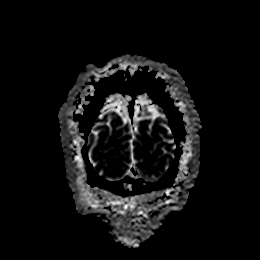

[Series 9: T1 · sagittal · 5.0mm · 0.75mm/px · 2 of 23 slices shown]
[im 1/23]
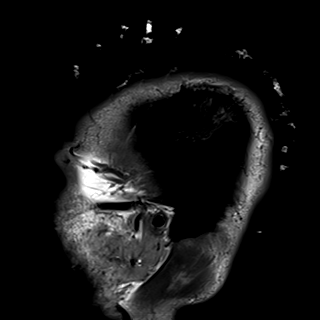
[im 23/23]
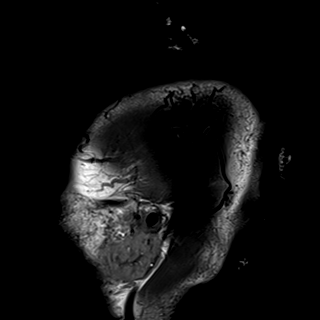

[Series 10: T2 · axial · 5.0mm · 0.78mm/px · z∈[-107,+61]mm · 2 of 30 slices shown (1 of 2)]
[im 1/30]
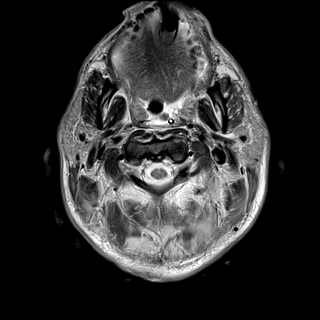
[im 30/30]
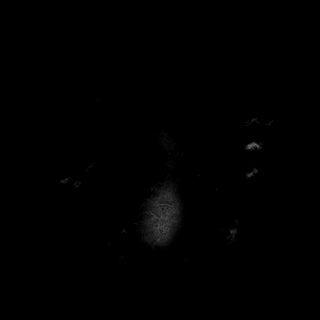

[Series 11: FLAIR · axial · 5.0mm · 0.45mm/px · z∈[-109,+59]mm · 2 of 30 slices shown]
[im 1/30]
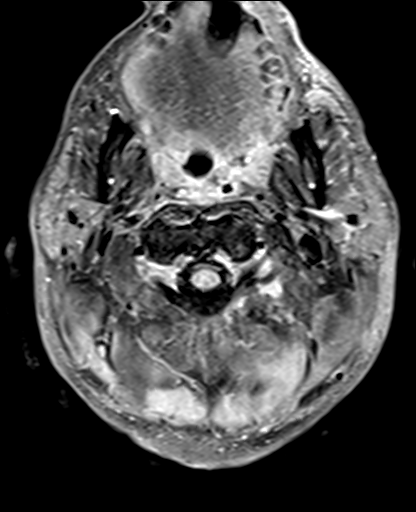
[im 30/30]
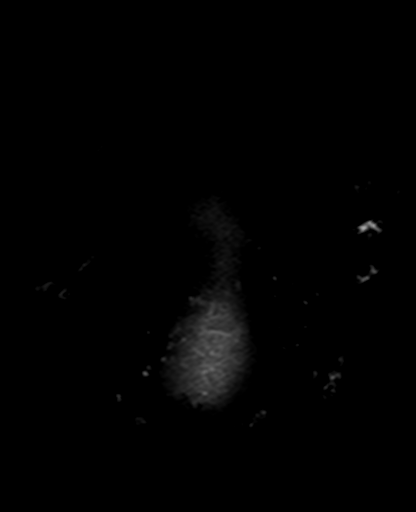

[Series 12: mag_images · axial · 3.0mm · 0.98mm/px · z∈[-110,+61]mm · 4 of 60 slices shown]
[im 1/60]
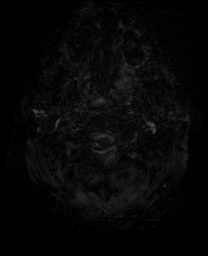
[im 20/60]
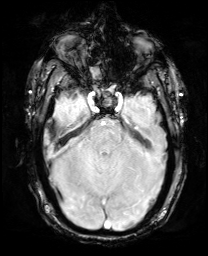
[im 40/60]
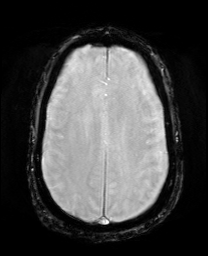
[im 60/60]
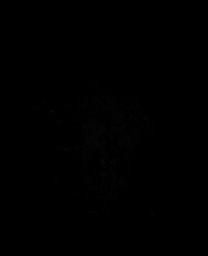

[Series 13: pha_images · axial · 3.0mm · 0.98mm/px · z∈[-110,+58]mm · 4 of 59 slices shown]
[im 1/59]
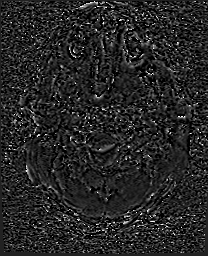
[im 20/59]
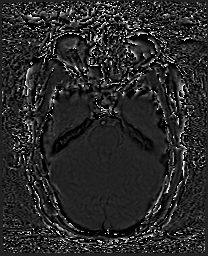
[im 39/59]
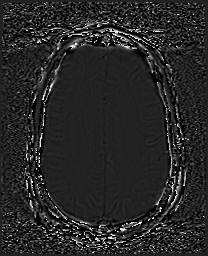
[im 59/59]
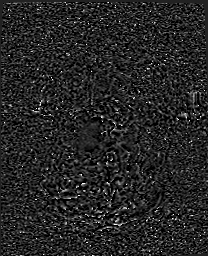

[Series 14: swi_images · axial · 3.0mm · 0.98mm/px · z∈[-110,+61]mm · 4 of 60 slices shown]
[im 1/60]
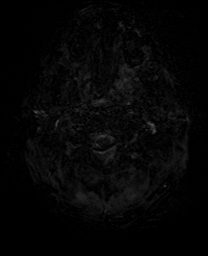
[im 20/60]
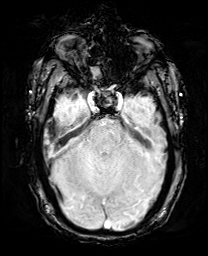
[im 40/60]
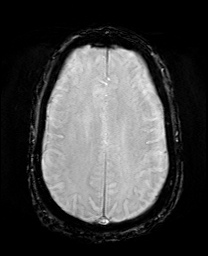
[im 60/60]
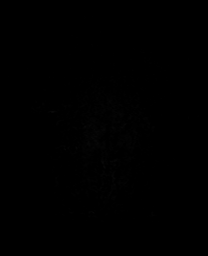

[Series 15: mip_images(sw) · axial · 24.0mm · 0.98mm/px · z∈[-100,+51]mm · 4 of 53 slices shown]
[im 1/53]
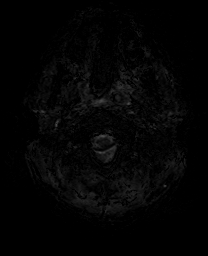
[im 18/53]
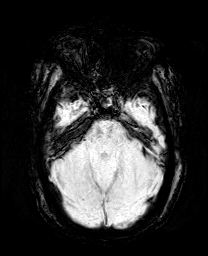
[im 35/53]
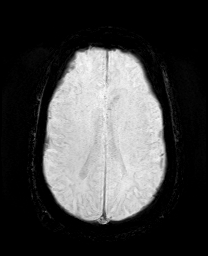
[im 53/53]
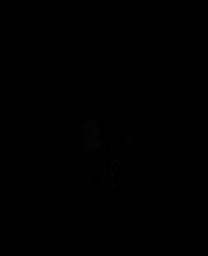

[Series 17: T2 · coronal · 5.0mm · 0.36mm/px · 2 of 30 slices shown (2 of 2)]
[im 1/30]
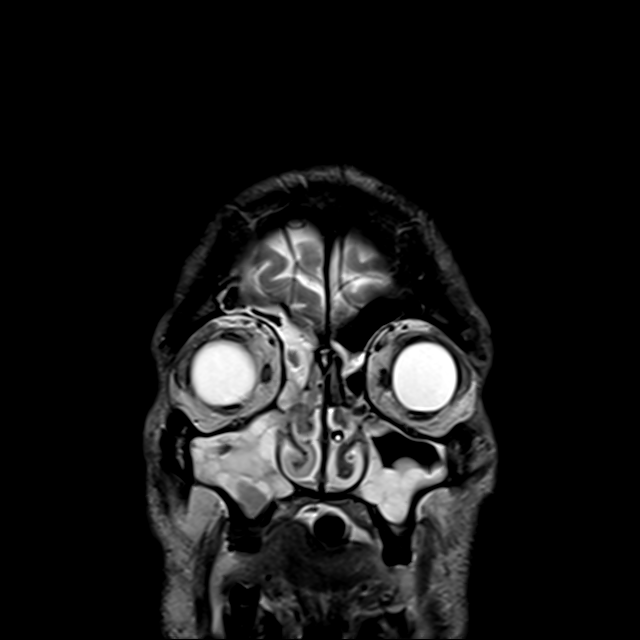
[im 30/30]
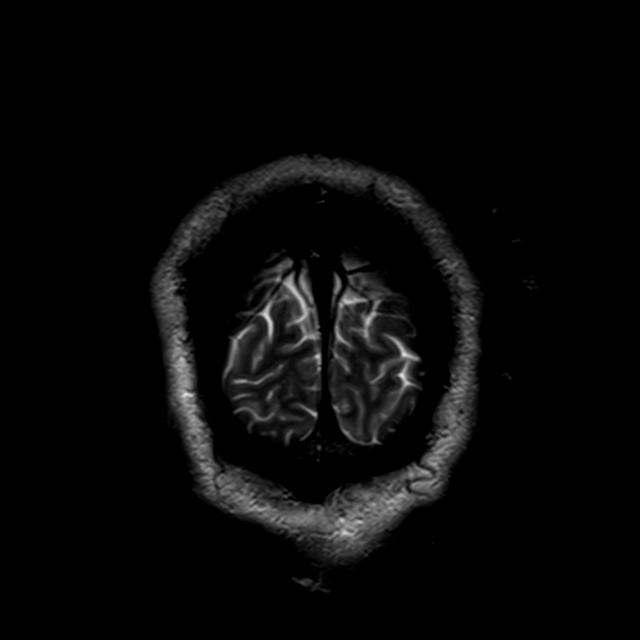

[44 of 48 positions shown; findings below may reference images not displayed]

FINDINGS: Brain: Diffusion imaging does not show any acute or subacute
infarction or other cause of restricted diffusion. The brainstem and
cerebellum are normal. Cerebral hemispheres show minimal small
vessel change of the white matter but no other abnormal finding.
Increased FLAIR signal within the dependent CSF is often seen in
patients with mechanical ventilation. No mass, hemorrhage,
hydrocephalus or extra-axial collection.

Vascular: Major vessels at the base of the brain show flow.

Skull and upper cervical spine: Negative

Sinuses/Orbits: Considerable inflammatory changes throughout
paranasal sinuses. Orbits negative.

Other: Bilateral mastoid effusions.
IMPRESSION: No acute or focal intracranial finding. Minimal small vessel change
of the hemispheric white matter.

Mild increase in FLAIR signal within the dependent CSF, often seen
in the setting of mechanical ventilation.

Inflammatory changes of the paranasal sinuses. Bilateral mastoid
effusions.

## 2020-12-27 MED ORDER — PHENYTOIN SODIUM 50 MG/ML IJ SOLN
100.0000 mg | Freq: Four times a day (QID) | INTRAMUSCULAR | Status: DC
  Administered 2020-12-27 – 2021-01-01 (×20): 100 mg via INTRAVENOUS
  Filled 2020-12-27 (×21): qty 2

## 2020-12-27 MED ORDER — DEXMEDETOMIDINE HCL IN NACL 400 MCG/100ML IV SOLN: 0.4000 ug/kg/h | INTRAVENOUS | Status: DC

## 2020-12-27 NOTE — Procedures (Signed)
Transported patient to MRI with RN at bedside

## 2020-12-27 NOTE — Progress Notes (Cosign Needed Addendum)
NAME:  Luis Hunter, MRN:  [id number], DOB:  Jul 04, 1975, LOS: 75 ADMISSION DATE:  12/16/2020, CONSULTATION DATE:  12/27/2020  REFERRING MD:  Starleen Blue, EDP [facility], CHIEF COMPLAINT:  seizures, intubated   History of Present Illness:  45 yo male smoker found unresponsive by his wife.  Noted to have multiple seizures witnessed by his wife.  Intubated for airway protection and loaded with keppra.  UDS positive for cocaine and THC.  Transferred from [facility] to [facility] for continuous EEG monitoring.  Pertinent  Medical History  Schizophrenia, Seizures, Bipolar, Depression, Cocaine abuse  Significant Hospital Events:  8/13 intubated, transferred to [facility] 8/16 respiratory aspirate with gram positive cocci, few gram negative rods 8/16 started on unasyn 8/17 unasyn discontinued 8/18 started on zosyn 8/20 vent dyssynchrony >> add precedex; EEG with periodic triphasic morphology and generalized slowing 8/22 cardiac catheterization for STEMI-no significant coronary artery disease discovered 8/23 remains encephalopathic, required escalation of sedatives for vent dyssynchrony   Interim History / Subjective:  Tachy (sinus) overnight treated with BB PRN  Objective   Blood pressure 132/78, pulse (!) 114, temperature 100.1 F (37.8 C), temperature source Axillary, resp. rate (!) 28, height '5\' 11"'$  (1.803 m), weight 88 kg, SpO2 97 %.    Vent Mode: PRVC FiO2 (%):  [40 %-60 %] 60 % Set Rate:  [28 bmp] 28 bmp Vt Set:  [550 mL] 550 mL PEEP:  [8 cmH20] 8 cmH20 Pressure Support:  [20 cmH20] 20 cmH20 Plateau Pressure:  [13 cmH20-36 cmH20] 23 cmH20   Intake/Output Summary (Last 24 hours) at 12/27/2020 0744 Last data filed at 12/27/2020 0700 Gross per 24 hour  Intake 3232.68 ml  Output 1820 ml  Net 1412.68 ml    Filed Weights   12/23/20 0500 12/26/20 0500 12/27/20 0444  Weight: 91.7 kg 86.8 kg 88 kg    Examination: General:  Middle aged male on vent Neuro:  Unresponsive, sedated HEENT:  [place]/AT, No JVD noted,  PERRL Cardiovascular:  Tachy, regular, no MRG Lungs:  bibasilar rhonchi Abdomen:  Soft, non-distended Musculoskeletal:  No acute deformity Skin:  Intact, MMM   Chest x-ray 6/24 with lower lobe infiltrates-unchanged from previous personally reviewed.   Resolved Hospital Problem list   Hypokalemia, Hypophosphatemia, AKI from ATN 2nd to sepsis, Urinary retention  Assessment & Plan:   Acute hypoxic/hypercapnic respiratory failure and ARDS Aspiration pneumonia with Serratia, enterobacter  -Full vent support  -Will switch to Bactrim to which both are sensitive  -Wake up assessment as tolerated -Fentanyl/Midazolam infusions for RASS goal -1 to -2.  Ideally we would wean, but vent dyssynchrony has been the limiting factor.   Fever -Related to Serratia/Enterobacter being treated -A-line and central line discontinued 8/21 -Will trend PCT to help guide antibiotics as we near the end of his course with continued fevers and leukocytosis.   Status epilepticus Polysubstance abuse History of depression/bipolar disorder/schizophrenia -Continue Keppra, Vimpat, phenobarbital, phenytoin, -Ativan for seizures -Epileptology following.   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 -Continue to monitor closely -Transfuse per protocol  Remains encephalopathic  Best Practice (right click and "Reselect all SmartList Selections" daily)  Diet/type: tubefeeds DVT prophylaxis: lovenox GI prophylaxis: protonix Code Status:  full code Last date of multidisciplinary goals of care discussion 8/18   Labs    CMP Latest Ref Rng &  Units 12/27/2020 12/25/2020 12/24/2020  Glucose 70 - 99 mg/dL 130(H) 109(H) 141(H)  BUN 6 - 20 mg/dL 37(H) 37(H) 32(H)   Creatinine 0.61 - 1.24 mg/dL 0.97 1.11 1.06  Sodium 135 - 145 mmol/L 143 145 144  Potassium 3.5 - 5.1 mmol/L 4.7 4.6 4.3  Chloride 98 - 111 mmol/L 111 112(H) 111  CO2 22 - 32 mmol/L '27 25 27  '$ Calcium 8.9 - 10.3 mg/dL 8.1(L) 7.9(L) 7.9(L)  Total Protein 6.5 - 8.1 g/dL - - -  Total Bilirubin 0.3 - 1.2 mg/dL - - -  Alkaline Phos 38 - 126 U/L - - -  AST 15 - 41 U/L - - -  ALT 0 - 44 U/L - - -    CBC Latest Ref Rng & Units 12/27/2020 12/25/2020 12/24/2020  WBC 4.0 - 10.5 K/uL 22.6(H) 23.5(H) 18.6(H)  Hemoglobin 13.0 - 17.0 g/dL 9.6(L) 9.9(L) 9.7(L)  Hematocrit 39.0 - 52.0 % 29.2(L) 29.3(L) 28.3(L)  Platelets 150 - 400 K/uL 240 185 171    ABG    Component Value Date/Time   PHART 7.440 12/22/2020 1014   PCO2ART 37.3 12/22/2020 1014   PO2ART 78.6 (L) 12/22/2020 1014   HCO3 24.5 12/22/2020 1014   TCO2 24 12/21/2020 1507   ACIDBASEDEF 1.0 12/21/2020 1101   O2SAT 94.6 12/22/2020 1014    CBG (last 3)  Recent Labs    12/26/20 2321 12/27/20 0327 12/27/20 0737  GLUCAP 115* 117* 111*    Critical care time 35 minutes.   Luis Hunter, AGACNP-BC [facility]  See Amion for personal pager PCCM on call pager [phone] until 7pm. Please call Elink 7p-7a. [phone]  12/27/2020 8:03 AM   Patient seen, independently examined, care plan was related and discussed with Luis Hunter as per documentation  Encephalopathy, remains unresponsive required escalating doses of fentanyl and Versed to sedate him yesterday for significant ventilator dyssynchrony, agitation Overnight did have tachycardia-treated  Unresponsive this morning Middle-age gentleman on the vent Moist oral mucosa Clear breath sounds anteriorly Bowel sounds appreciated  Labs reviewed  Acute hypoxemic respiratory failure Aspiration pneumonia with Serratia and Enterobacter -Was on cefepime-ended 12/26/2020  Status epilepticus Polysubstance abuse History of depression/bipolar  disorder/schizophrenia -Continues on Vimpat phenobarbital, phenytoin  Discussed with neurology -Unclear about his persistent unresponsiveness -MRI will be done  Try and wean off sedation Switched over to Precedex  Hypertension -Continue antihypertensives  May require PEG and trach for longer-term management with his persistent encephalopathy  The patient is critically ill with multiple organ systems failure and requires high complexity decision making for assessment and support, frequent evaluation and titration of therapies, application of advanced monitoring technologies and extensive interpretation of multiple databases. Critical Care Time devoted to patient care services described in this note independent of APP/resident time (if applicable)  is 32 minutes.   Sherrilyn Rist MD [facility] Personal pager: See Amion If unanswered, please page CCM On-call: [phone]

## 2020-12-27 NOTE — Progress Notes (Addendum)
Subjective: No clinical seizures overnight.  ROS: Unable to obtain due to poor mental status  Examination  Vital signs in last 24 hours: Temp:  [99.2 F (37.3 C)-101.7 F (38.7 C)] 100.1 F (37.8 C) (08/24 0739) Pulse Rate:  [101-255] 116 (08/24 0900) Resp:  [21-36] 28 (08/24 0900) BP: (107-170)/(55-130) 119/67 (08/24 0900) SpO2:  [86 %-100 %] 92 % (08/24 0900) FiO2 (%):  [40 %-60 %] 60 % (08/24 0749) Weight:  [88 kg] 88 kg (08/24 0444)  General: lying in bed, NAD CVS: pulse-normal rate and rhythm RS: intubated, CTAB Extremities: normal, warm Neuro: comatose, does not open eyes to noxious stimuli, PERRLA, corneal reflex present, gag reflex present, does not withdraw to noxious stimuli in all extremities   Basic Metabolic Panel: Recent Labs  Lab 12/21/20 0440 12/21/20 1101 12/22/20 0351 12/23/20 0500 12/24/20 0520 12/25/20 0142 12/27/20 0232  NA 139   < > 142 140 144 145 143  K 3.8   < > 4.1 4.2 4.3 4.6 4.7  CL 110  --  111 109 111 112* 111  CO2 21*  --  '24 25 27 25 27  '$ GLUCOSE 167*  --  182* 141* 141* 109* 130*  BUN 32*  --  27* 27* 32* 37* 37*  CREATININE 1.70*  --  1.35* 1.22 1.06 1.11 0.97  CALCIUM 7.5*  --  7.6* 7.6* 7.9* 7.9* 8.1*  MG 2.4  --   --   --   --   --   --   PHOS 4.3  --   --   --   --   --   --    < > = values in this interval not displayed.    CBC: Recent Labs  Lab 12/21/20 0440 12/21/20 1101 12/22/20 0351 12/23/20 0500 12/24/20 0520 12/25/20 0142 12/27/20 0232  WBC 17.7*  --  19.5* 15.4* 18.6* 23.5* 22.6*  NEUTROABS 15.7*  --   --  11.5*  --   --  19.1*  HGB 11.1*   < > 10.8* 10.2* 9.7* 9.9* 9.6*  HCT 32.5*   < > 32.1* 29.4* 28.3* 29.3* 29.2*  MCV 86.2  --  84.5 85.2 86.0 87.2 88.2  PLT 222  --  198 178 171 185 240   < > = values in this interval not displayed.     Coagulation Studies: No results for input(s): LABPROT, INR in the last 72 hours.  Imaging No new brain imaging overnight   ASSESSMENT AND PLAN: 45 year old male  with history of epilepsy who presented with status epilepticus in the setting of medication noncompliance.   Status epilepticus (resolved) Epilepsy with breakthrough seizure Acute encephalopathy, postictal and medication induced Aspiration pneumonia with Serratia Leukocytosis Microcytic anemia Hyperammonemia - Continues to be comatose.  Has been seizure-free for almost a week.  Has been on minimal sedation for vent dyssynchrony.  Also reducing antiseizure medications to minimize sedation.  MRI brain did not show any evidence of acute abnormality.  UDS did show cocaine and THC on 12/12/2020.  Could be due to infection vs cefepime toxicity ( last dose of cefepime this am)   Recommendations -Continues to have subtherapeutic phenytoin levels.  Discussed with pharmacy.  It appears that levels have been subtherapeutic since we transitioned him to p.o.  Therefore, we will switch to IV phenytoin and continue to monitor levels -We will discontinue phenobarbital to minimize sedation. -We will repeat MRI brain without contrast to look for any acute abnormality -We will repeat a urine  drug screen to see if he continues to have cocaine and THC -We will also check for other metabolic causes of encephalopathy like TSH, B12,folate, thiamine -Continue Keppra to 1500 mg twice daily, Vimpat 100 mg twice daily -Minimize sedation to help with neuro prognostication -Patient has been intubated for close to 10 days and I do not anticipate him to be extubated over the next few days.  Discussed with ICU physician Dr. Ander Slade.  He is going to discuss with family about potentially tracheostomy next week if patient continues to be comatose -Continue seizure precautions -As needed IV versed for clinical seizure-like activity -Discussed my assessment and plan with Dr. Ander Slade  -Management of rest of comorbidities per primary team   CRITICAL CARE Performed by: Lora Havens   Total critical care time: 35  minutes  Critical care time was exclusive of separately billable procedures and treating other patients.  Critical care was necessary to treat or prevent imminent or life-threatening deterioration.  Critical care was time spent personally by me on the following activities: development of treatment plan with patient and/or surrogate as well as nursing, discussions with consultants, evaluation of patient's response to treatment, examination of patient, obtaining history from patient or surrogate, ordering and performing treatments and interventions, ordering and review of laboratory studies, ordering and review of radiographic studies, pulse oximetry and re-evaluation of patient's condition.    Zeb Comfort Epilepsy [facility] For questions after 5pm please refer to AMION to reach the Neurologist on call

## 2020-12-27 NOTE — Procedures (Signed)
Cortrak  Person Inserting Tube:  Meerab Maselli, RD Tube Type:  Cortrak - 43 inches Tube Size:  10 Tube Location:  Left nare Initial Placement:  Postpyloric Technique Used to Measure Tube Placement:  Marking at nare/corner of mouth Cortrak Secured At:  90 cm Procedure Comments:  Cortrak Tube Team Note:  Consult received to place a Cortrak feeding tube.   X-ray is required, abdominal x-ray has been ordered by the Cortrak team. Please confirm tube placement before using the Cortrak tube.   If the tube becomes dislodged please keep the tube and contact the Cortrak team at [email or web address] ([email or web address]) for replacement.  If after hours and replacement cannot be delayed, place a NG tube and confirm placement with an abdominal x-ray.   Mariana Single MS, RD, LDN, CNSC Clinical Nutrition Pager listed in [place]

## 2020-12-27 NOTE — Progress Notes (Signed)
Phenytoin Follow-Up Consult Indication: Seizures  No Known Allergies  Patient Measurements: Height: _0  (180.3 cm) Weight: 88 kg (194 lb 0.1 oz) IBW/kg (Calculated) : 75.3   Body mass index is 27.06 kg/m.   Vital signs: Temp: 100.1 F (37.8 C) (08/24 0739) Temp Source: Axillary (08/24 0739) BP: 119/67 (08/24 0900) Pulse Rate: 116 (08/24 0900)  Labs: Lab Results  Component Value Date/Time   Phenytoin Lvl <2.5 (L) 12/27/2020 0232   Lab Results  Component Value Date   PHENYTOIN <2.5 (L) 12/27/2020   Estimated Creatinine Clearance: 101.3 mL/min (by C-G formula based on SCr of 0.97 mg/dL).   Medications:  Scheduled:   amLODipine  10 mg Per Tube Daily   chlorhexidine gluconate (MEDLINE KIT)  15 mL Mouth Rinse BID   Chlorhexidine Gluconate Cloth  6 each Topical Daily   docusate  100 mg Per Tube BID   enoxaparin (LOVENOX) injection  40 mg Subcutaneous Q24H   feeding supplement (PROSource TF)  45 mL Per Tube Daily   free water  30 mL Per Tube Q4H   lacosamide  100 mg Per Tube Q12H   levETIRAcetam  1,500 mg Per Tube Q12H   liver oil-zinc oxide   Topical BID   mouth rinse  15 mL Mouth Rinse 10 times per day   metoprolol tartrate  50 mg Per Tube BID   pantoprazole sodium  40 mg Per Tube Daily   PHENObarbital  65 mg Intravenous BID   phenytoin  100 mg Per Tube Q6H   polyethylene glycol  17 g Per Tube Daily   sodium chloride flush  10-40 mL Intracatheter Q12H   sodium chloride flush  3 mL Intravenous Q12H   Infusions:   sodium chloride 10 mL/hr at 12/27/20 0800   sodium chloride     feeding supplement (VITAL 1.5 CAL) 1,000 mL (12/27/20 0711)   fentaNYL infusion INTRAVENOUS 150 mcg/hr (12/27/20 0833)   midazolam 2 mg/hr (12/27/20 0800)   PRN: sodium chloride, acetaminophen, docusate, fentaNYL, fentaNYL, fentaNYL (SUBLIMAZE) injection, hydrALAZINE, midazolam, polyethylene glycol, prochlorperazine, sodium chloride flush, sodium chloride flush  Assessment: Phenytoin  has been trending down, likely as a result of co-administration with TF. Phenytoin was switched from IV on 8/21 which coincides with drop in corrected phenytoin. After discussion with Dr. Hortense Ramal, phenytoin dose increased to 100 mg Q6H PT while on TF 8/23. With phenytoin level plummeting to <2.5 (uncorrected) on 8/24, will switch back to IV phenytoin. Dr. Hortense Ramal is ok with this plan and emphasized we are trying to prevent adding other agents so as to assess neural activity and allow the patient to wake. Versed and fentanyl gtt started 8/23 afternoon as patient was fighting the ventilator. Discussed with Dr. Ander Slade and we will stop these agents and use precedex as our sedative of choice for now.   8/13 DPH lvl < 2.5, albumin 3.6 8/14 DPH lvl 15 8/15 DPH lvl 14.8, albumin 2.7 >> corrected to 23  mcg/mL. 8/16 DPH lvl 11.8, albumin 2.7 >> corrected to 18 8/17 DPH lvl 11.2, albumin 2.0>>corrected to 22   8/18 DPH lvl 11.2, albumin 1.7>>corrected to 25.45 8/19 Free DPH 3.5  8/20 DPH 10.2, albumin 1.6>>corrected to 18.9 8/21 DPH 8.1, albumin 1.6>>corrected to 15 8/22 DPH 5.5, albumin 1.6>>corrected to 13  8/23 DPH 2.9, albumin 1.6>> corrected to 6.9 8/24 DPH <2.5, albumin 1.6>>corrected to 5.95 (imprecise as level is <2.5)    8/16 phenobarb level 20.6  8/18 phenobarb level 21.6  8/19 Phenobarb 23.7 8/20  Phenobarb 25.1 8/21 Phenobarb 23.2 8/22 Phenobarb 26.4  8/23 Phenobarb 26.4  8/24 Phenobarb 25.9    Goals of care:  Total phenytoin level: 10-20 mcg/ml Free phenytoin level: 1-2 mcg/ml  Plan:  STOP phenytoin PT  START phenytoin 100 mg Q6H IV  Pharmacy will continue to follow regarding obtaining total phenytoin levels and dose adjustments if corrected phenytoin levels falls below 10. F/U daily phenytoin/phenobarb levels  F/U neuro recs  Monitor for seizure activity   Adria Dill, PharmD PGY-1 Acute Care Resident  12/27/2020 9:25 AM

## 2020-12-28 ENCOUNTER — Inpatient Hospital Stay (HOSPITAL_COMMUNITY): Payer: Medicare HMO

## 2020-12-28 LAB — PHOSPHORUS
Phosphorus: 2.4 mg/dL — ABNORMAL LOW (ref 2.5–4.6)
Phosphorus: 3.5 mg/dL (ref 2.5–4.6)
Phosphorus: 2.9 mg/dL (ref 2.5–4.6)

## 2020-12-28 LAB — PHENYTOIN LEVEL, TOTAL: Phenytoin Lvl: <2.5 ug/mL — ABNORMAL LOW (ref 10.0–20.0)

## 2020-12-28 LAB — CBC
HCT: 29.4 % — ABNORMAL LOW (ref 39.0–52.0)
Hemoglobin: 9.7 g/dL — ABNORMAL LOW (ref 13.0–17.0)
MCH: 28.8 pg (ref 26.0–34.0)
MCHC: 33 g/dL (ref 30.0–36.0)
MCV: 87.2 fL (ref 80.0–100.0)
Platelets: 287 10*3/uL (ref 150–400)
RBC: 3.37 MIL/uL — ABNORMAL LOW (ref 4.22–5.81)
RDW: 14.6 % (ref 11.5–15.5)
WBC: 21.9 10*3/uL — ABNORMAL HIGH (ref 4.0–10.5)
nRBC: 0 % (ref 0.0–0.2)

## 2020-12-28 LAB — GLUCOSE, CAPILLARY
Glucose-Capillary: 120 mg/dL — ABNORMAL HIGH (ref 70–99)
Glucose-Capillary: 122 mg/dL — ABNORMAL HIGH (ref 70–99)
Glucose-Capillary: 122 mg/dL — ABNORMAL HIGH (ref 70–99)
Glucose-Capillary: 130 mg/dL — ABNORMAL HIGH (ref 70–99)
Glucose-Capillary: 96 mg/dL (ref 70–99)
Glucose-Capillary: 98 mg/dL (ref 70–99)

## 2020-12-28 LAB — POCT I-STAT 7, (LYTES, BLD GAS, ICA,H+H)
Acid-Base Excess: 3 mmol/L — ABNORMAL HIGH (ref 0.0–2.0)
Bicarbonate: 27.4 mmol/L (ref 20.0–28.0)
Calcium, Ion: 1.19 mmol/L (ref 1.15–1.40)
HCT: 32 % — ABNORMAL LOW (ref 39.0–52.0)
Hemoglobin: 10.9 g/dL — ABNORMAL LOW (ref 13.0–17.0)
O2 Saturation: 97 %
Patient temperature: 98.8
Potassium: 4.3 mmol/L (ref 3.5–5.1)
Sodium: 143 mmol/L (ref 135–145)
TCO2: 29 mmol/L (ref 22–32)
pCO2 arterial: 42.6 mmHg (ref 32.0–48.0)
pH, Arterial: 7.417 (ref 7.350–7.450)
pO2, Arterial: 88 mmHg (ref 83.0–108.0)

## 2020-12-28 LAB — COMPREHENSIVE METABOLIC PANEL
ALT: 129 U/L — ABNORMAL HIGH (ref 0–44)
AST: 165 U/L — ABNORMAL HIGH (ref 15–41)
Albumin: 1.5 g/dL — ABNORMAL LOW (ref 3.5–5.0)
Alkaline Phosphatase: 85 U/L (ref 38–126)
Anion gap: 7 (ref 5–15)
BUN: 32 mg/dL — ABNORMAL HIGH (ref 6–20)
CO2: 25 mmol/L (ref 22–32)
Calcium: 8.1 mg/dL — ABNORMAL LOW (ref 8.9–10.3)
Chloride: 110 mmol/L (ref 98–111)
Creatinine, Ser: 0.75 mg/dL (ref 0.61–1.24)
GFR, Estimated: >60 mL/min (ref >60)
Glucose, Bld: 105 mg/dL — ABNORMAL HIGH (ref 70–99)
Potassium: 4.4 mmol/L (ref 3.5–5.1)
Sodium: 142 mmol/L (ref 135–145)
Total Bilirubin: 1.2 mg/dL (ref 0.3–1.2)
Total Protein: 5.7 g/dL — ABNORMAL LOW (ref 6.5–8.1)

## 2020-12-28 LAB — RAPID URINE DRUG SCREEN, HOSP PERFORMED
Amphetamines: NOT DETECTED
Barbiturates: POSITIVE — AB
Benzodiazepines: POSITIVE — AB
Cocaine: NOT DETECTED
Opiates: NOT DETECTED
Tetrahydrocannabinol: POSITIVE — AB

## 2020-12-28 LAB — MAGNESIUM
Magnesium: 1.8 mg/dL (ref 1.7–2.4)
Magnesium: 2 mg/dL (ref 1.7–2.4)
Magnesium: 2.1 mg/dL (ref 1.7–2.4)

## 2020-12-28 LAB — PHENOBARBITAL LEVEL: Phenobarbital: 25.1 ug/mL (ref 15.0–30.0)

## 2020-12-28 LAB — PROCALCITONIN: Procalcitonin: 0.83 ng/mL

## 2020-12-28 IMAGING — DX DG CHEST 1V PORT
2 series · 2 of 2 positions shown · non-contrast
Comparison: [DATE]

CLINICAL DATA: Increased oxygen requirement, respiratory distress

EXAM:
PORTABLE CHEST 1 VIEW

[chest ap (1 of 2)]
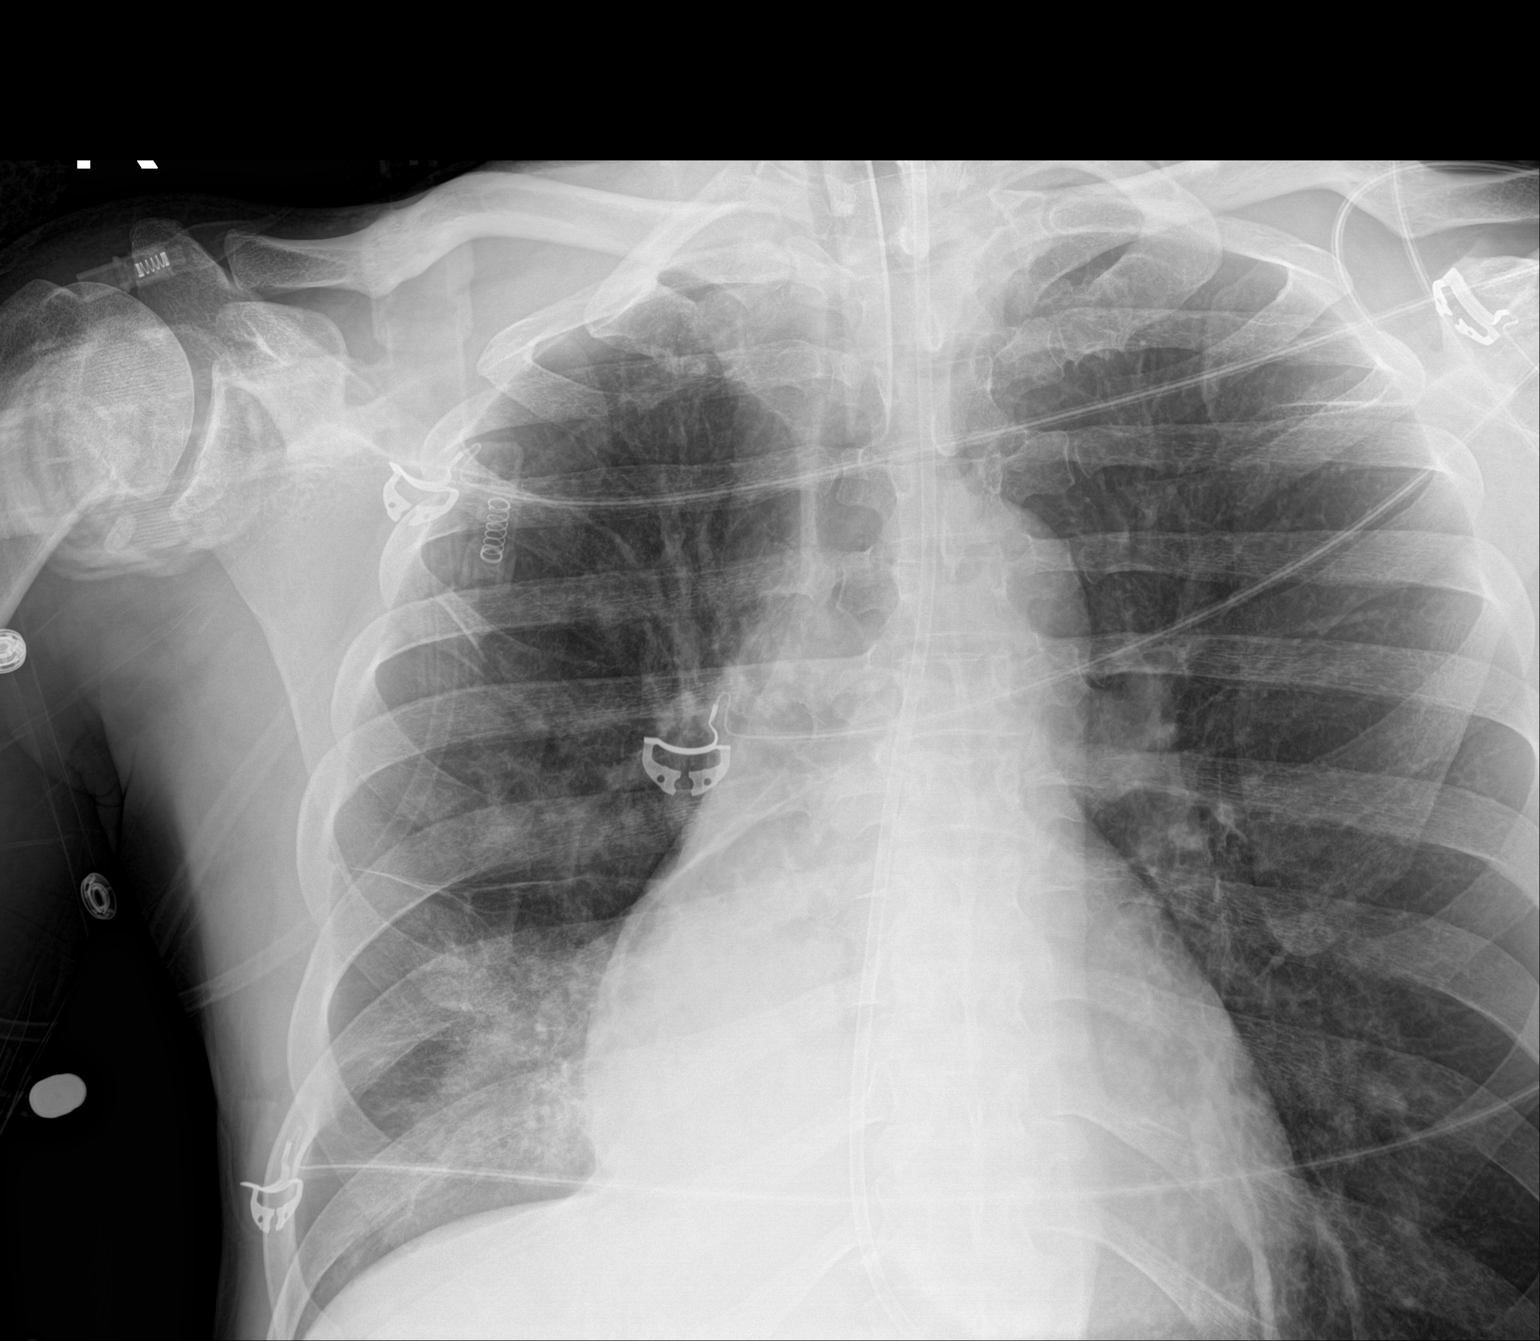

[chest ap (2 of 2)]
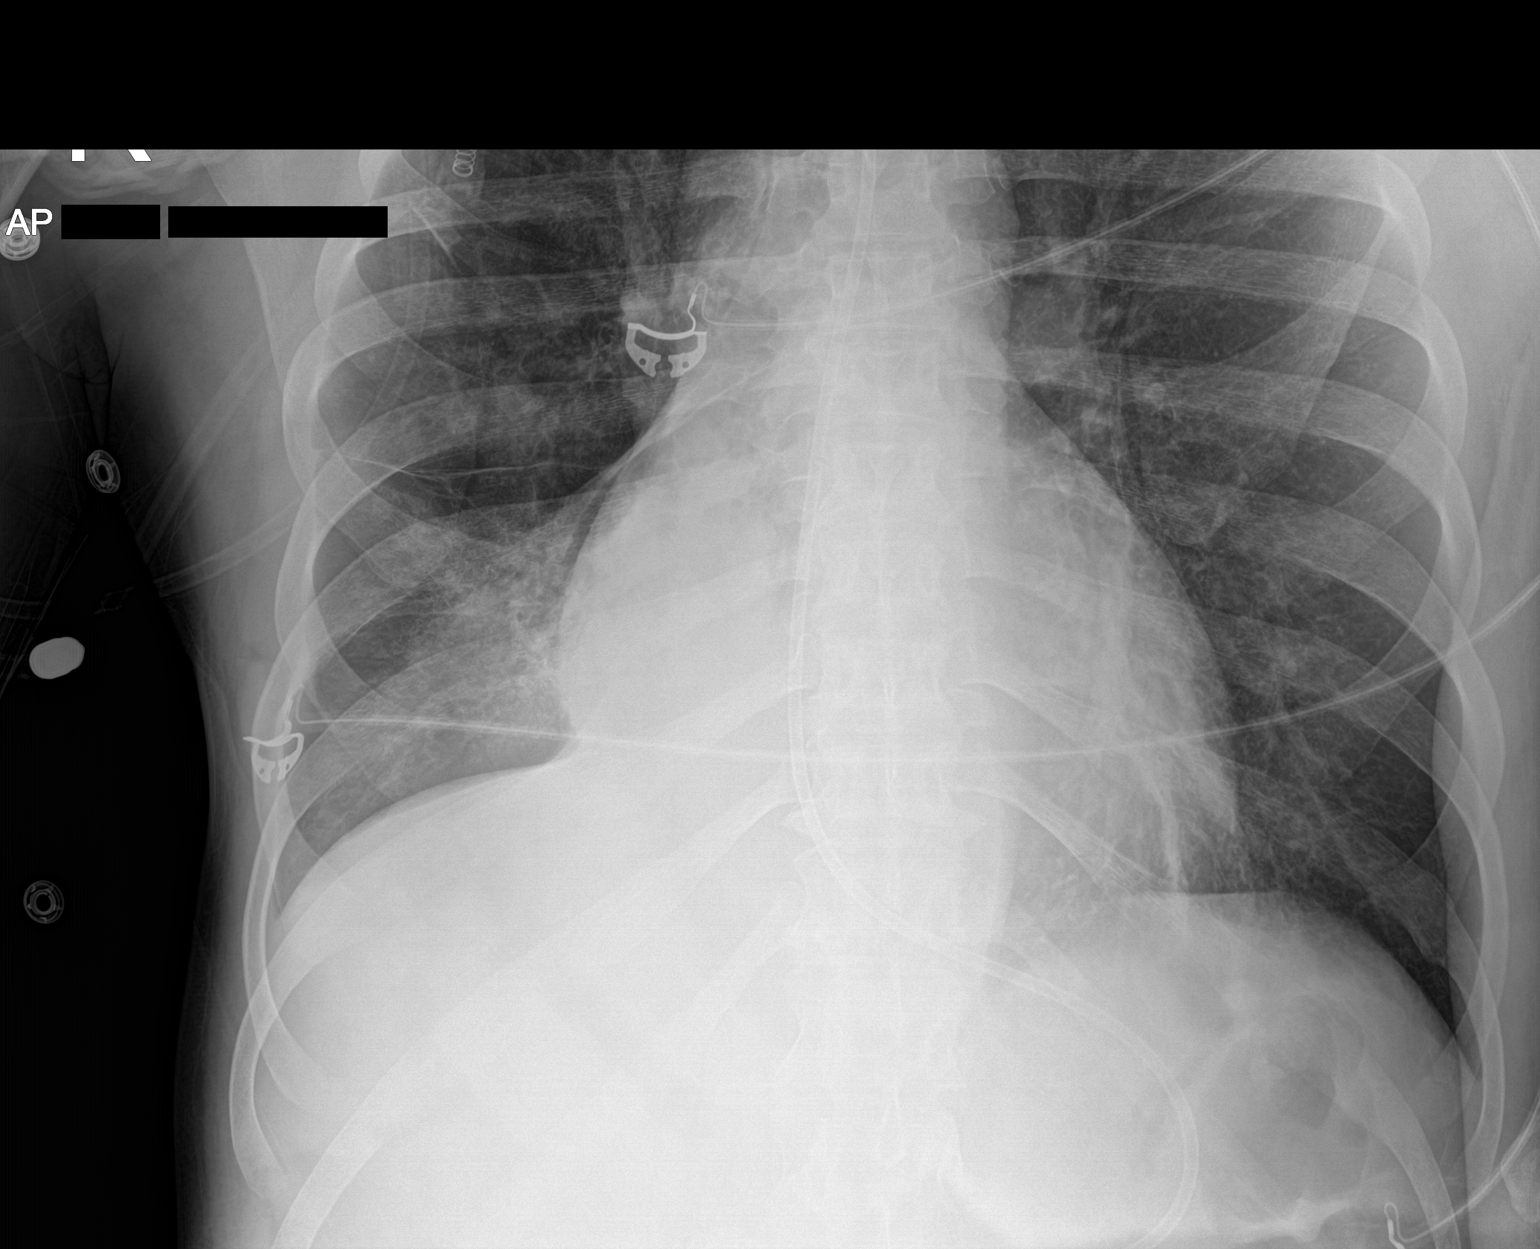

[2 of 2 positions shown; findings below may reference images not displayed]

FINDINGS: Endotracheal tube is unchanged. Patchy opacity in the right lower
lobe, improved since prior study improving left basilar opacity. No
effusions. Cardiomegaly.
IMPRESSION: Improving bibasilar airspace opacities with continued mild patchy
right lower lobe infiltrate.

Cardiomegaly.

## 2020-12-28 MED ORDER — METOPROLOL TARTRATE 25 MG PO TABS
25.0000 mg | ORAL_TABLET | Freq: Once | ORAL | Status: AC
  Administered 2020-12-28: 25 mg
  Filled 2020-12-28: qty 1

## 2020-12-28 MED ORDER — PROSOURCE TF PO LIQD: 45.0000 mL | Freq: Two times a day (BID) | ORAL | Status: DC

## 2020-12-28 MED ORDER — IPRATROPIUM-ALBUTEROL 0.5-2.5 (3) MG/3ML IN SOLN
RESPIRATORY_TRACT | Status: AC
  Filled 2020-12-28: qty 3

## 2020-12-28 MED ORDER — DEXMEDETOMIDINE HCL IN NACL 400 MCG/100ML IV SOLN
0.4000 ug/kg/h | INTRAVENOUS | Status: DC
  Administered 2020-12-28: 1.2 ug/kg/h via INTRAVENOUS
  Administered 2020-12-28: 0.7 ug/kg/h via INTRAVENOUS
  Administered 2020-12-28: 1.2 ug/kg/h via INTRAVENOUS
  Administered 2020-12-28: 0.4 ug/kg/h via INTRAVENOUS
  Administered 2020-12-28: 0.7 ug/kg/h via INTRAVENOUS
  Administered 2020-12-29: 1.2 ug/kg/h via INTRAVENOUS
  Administered 2020-12-29: 0.8 ug/kg/h via INTRAVENOUS
  Administered 2020-12-29 (×2): 1.2 ug/kg/h via INTRAVENOUS
  Administered 2020-12-30 (×4): 0.9 ug/kg/h via INTRAVENOUS
  Administered 2020-12-30: 0.8 ug/kg/h via INTRAVENOUS
  Administered 2020-12-31: 0.9 ug/kg/h via INTRAVENOUS
  Administered 2020-12-31: 1 ug/kg/h via INTRAVENOUS
  Administered 2020-12-31 (×2): 0.9 ug/kg/h via INTRAVENOUS
  Administered 2021-01-01 (×5): 1 ug/kg/h via INTRAVENOUS
  Administered 2021-01-02: 1.2 ug/kg/h via INTRAVENOUS
  Administered 2021-01-02: 1 ug/kg/h via INTRAVENOUS
  Administered 2021-01-02 (×2): 1.2 ug/kg/h via INTRAVENOUS
  Administered 2021-01-02: 1 ug/kg/h via INTRAVENOUS
  Administered 2021-01-03 (×2): 1.2 ug/kg/h via INTRAVENOUS
  Filled 2020-12-28 (×15): qty 100
  Filled 2020-12-28: qty 200
  Filled 2020-12-28 (×3): qty 100
  Filled 2020-12-28: qty 200
  Filled 2020-12-28 (×8): qty 100

## 2020-12-28 MED ORDER — METOPROLOL TARTRATE 25 MG PO TABS
75.0000 mg | ORAL_TABLET | Freq: Two times a day (BID) | ORAL | Status: DC
  Administered 2020-12-28 – 2021-01-10 (×26): 75 mg
  Filled 2020-12-28 (×27): qty 3

## 2020-12-28 MED ORDER — VITAL HIGH PROTEIN PO LIQD: 1000.0000 mL | ORAL | Status: DC

## 2020-12-28 MED ORDER — MAGNESIUM SULFATE 2 GM/50ML IV SOLN
2.0000 g | Freq: Once | INTRAVENOUS | Status: AC
  Administered 2020-12-28: 2 g via INTRAVENOUS
  Filled 2020-12-28: qty 50

## 2020-12-28 MED ORDER — IPRATROPIUM-ALBUTEROL 0.5-2.5 (3) MG/3ML IN SOLN
3.0000 mL | Freq: Four times a day (QID) | RESPIRATORY_TRACT | Status: DC
  Administered 2020-12-28 – 2020-12-31 (×12): 3 mL via RESPIRATORY_TRACT
  Filled 2020-12-28 (×12): qty 3

## 2020-12-28 MED ORDER — AMOXICILLIN-POT CLAVULANATE 875-125 MG PO TABS
1.0000 | ORAL_TABLET | Freq: Two times a day (BID) | ORAL | Status: DC
  Administered 2020-12-28 – 2020-12-30 (×5): 1
  Filled 2020-12-28 (×5): qty 1

## 2020-12-28 MED ORDER — METOPROLOL TARTRATE 25 MG PO TABS: 50.0000 mg | ORAL_TABLET | Freq: Three times a day (TID) | ORAL | Status: DC

## 2020-12-28 NOTE — Progress Notes (Signed)
Patient has frequent episodes of inc HR, inc RR, inc WOB, diaphoresis. Drl Olalere was notified. We will continue to give prn meds as ordered and monitor closely.

## 2020-12-28 NOTE — Progress Notes (Signed)
Subjective: No acute events overnight.  Continues to require sedation for vent dyssynchrony.   ROS: Unable to obtain due to poor mental status  Examination  Vital signs in last 24 hours: Temp:  [98.7 F (37.1 C)-101.4 F (38.6 C)] 98.8 F (37.1 C) (08/25 0718) Pulse Rate:  [101-135] 110 (08/25 1000) Resp:  [28-36] 29 (08/25 1000) BP: (127-170)/(71-101) 149/101 (08/25 1000) SpO2:  [9 %-100 %] 98 % (08/25 1000) FiO2 (%):  [40 %] 40 % (08/25 1000) Weight:  [88.2 kg] 88.2 kg (08/25 0500)  General: lying in bed, NAD CVS: pulse-normal rate and rhythm RS: intubated, CTAB Extremities: normal, warm Neuro: comatose, barely opens eyes to noxious stimuli, PERRLA, corneal reflex present, gag reflex present, does not withdraw to noxious stimuli in all extremities     Basic Metabolic Panel: Recent Labs  Lab 12/23/20 0500 12/24/20 0520 12/25/20 0142 12/27/20 0232 12/28/20 0149 12/28/20 0940  NA 140 144 145 143 142 143  K 4.2 4.3 4.6 4.7 4.4 4.3  CL 109 111 112* 111 110  --   CO2 '25 27 25 27 25  '$ --   GLUCOSE 141* 141* 109* 130* 105*  --   BUN 27* 32* 37* 37* 32*  --   CREATININE 1.22 1.06 1.11 0.97 0.75  --   CALCIUM 7.6* 7.9* 7.9* 8.1* 8.1*  --   MG  --   --   --   --  1.8  --   PHOS  --   --   --   --  2.4*  --     CBC: Recent Labs  Lab 12/23/20 0500 12/24/20 0520 12/25/20 0142 12/27/20 0232 12/28/20 0149 12/28/20 0940  WBC 15.4* 18.6* 23.5* 22.6* 21.9*  --   NEUTROABS 11.5*  --   --  19.1*  --   --   HGB 10.2* 9.7* 9.9* 9.6* 9.7* 10.9*  HCT 29.4* 28.3* 29.3* 29.2* 29.4* 32.0*  MCV 85.2 86.0 87.2 88.2 87.2  --   PLT 178 171 185 240 287  --      Coagulation Studies: No results for input(s): LABPROT, INR in the last 72 hours.  Imaging No new brain imaging overnight   ASSESSMENT AND PLAN: 45 year old male with history of epilepsy who presented with status epilepticus in the setting of medication noncompliance.   Status epilepticus (resolved) Epilepsy with  breakthrough seizure Acute encephalopathy, postictal and medication induced ( improving) -Slightly opening eyes today. Has been seizure-free for more than a week. Has been on minimal sedation for vent dyssynchrony.  Also reducing antiseizure medications to minimize sedation.  Repeat MRI brain did not show any evidence of acute abnormality.  UDS did show cocaine and THC on 12/12/2020.  -Most likely multifactorial encephalopathy due to infection, medications, drug use, prolonged postictal state.    Recommendations -Repeat urine drug screen pending  -Normal TSH, folate and B12.  Vitamin B1 level pending -Continue Keppra to 1500 mg twice daily, Vimpat 100 mg twice daily, continue phenytoin 100 mg every 6 hours 8. -Minimize sedation to help with neuro prognostication -Continue seizure precautions -As needed IV versed for clinical seizure-like activity -Discussed my assessment and plan with Dr. Ander Slade  -Management of rest of comorbidities per primary team  I have spent a total of  25 minutes with the patient reviewing hospital notes,  test results, labs and examining the patient as well as establishing an assessment and plan.  > 50% of time was spent in direct patient care.   Zeb Comfort Epilepsy  [facility] For questions after 5pm please refer to AMION to reach the Neurologist on call

## 2020-12-28 NOTE — Progress Notes (Signed)
Called to evaluate patient with increased oxygen requirement  Poor air movement bilaterally on auscultation  Not been suction for much secretions  Obtain stat chest x-ray Bronchodilator treatment  Will follow

## 2020-12-28 NOTE — Progress Notes (Signed)
NAME:  Luis Hunter, MRN:  [id number], DOB:  [birth year], LOS: 12 ADMISSION DATE:  12/16/2020, CONSULTATION DATE:  12/28/2020  REFERRING MD:  Starleen Blue, EDP [facility], CHIEF COMPLAINT:  seizures, intubated   History of Present Illness:  45 yo male smoker found unresponsive by his wife.  Noted to have multiple seizures witnessed by his wife.  Intubated for airway protection and loaded with keppra.  UDS positive for cocaine and THC.  Transferred from [facility] to [facility] for continuous EEG monitoring.  Pertinent  Medical History  Schizophrenia, Seizures, Bipolar, Depression, Cocaine abuse  Significant Hospital Events:  8/13 intubated, transferred to [facility] 8/16 respiratory aspirate with gram positive cocci, few gram negative rods 8/16 started on unasyn 8/17 unasyn discontinued 8/18 started on zosyn 8/20 vent dyssynchrony >> add precedex; EEG with periodic triphasic morphology and generalized slowing 8/22 cardiac catheterization for STEMI-no significant coronary artery disease discovered 8/23 remains encephalopathic, required escalation of sedatives for vent dyssynchrony  8/24 MRI-mastoid fullness, no intracerebral abnormality  Interim History / Subjective:  Tachycardic, tachypneic this morning -We will initiate Precedex  Objective   Blood pressure 139/81, pulse (!) 134, temperature 98.8 F (37.1 C), temperature source Oral, resp. rate (!) 29, height '5\' 11"'$  (1.803 m), weight 88.2 kg, SpO2 96 %.    Vent Mode: PRVC FiO2 (%):  [40 %] 40 % Set Rate:  [28 bmp-29 bmp] 29 bmp Vt Set:  [550 mL] 550 mL PEEP:  [5 cmH20-8 cmH20] 5 cmH20 Plateau Pressure:  [17 cmH20-38 cmH20] 38 cmH20   Intake/Output Summary (Last 24 hours) at 12/28/2020 0914 Last data filed at 12/28/2020 0600 Gross per 24 hour  Intake 535.63 ml  Output 2355 ml  Net -1819.37 ml   Filed Weights   12/26/20 0500 12/27/20 0444 12/28/20 0500  Weight: 86.8 kg 88 kg 88.2 kg    Examination: General: Middle-aged, on vent, chronically  ill-appearing Neuro: Unresponsive HEENT: Pupils equal Cardiovascular: Tachycardic, S1-S2 appreciated Lungs: Fair air entry, no rhonchi Abdomen: Bowel sounds appreciated Musculoskeletal:  No acute deformity Skin:  Intact, MMM   Chest x-ray 6/24 with lower lobe infiltrates-unchanged from previous personally reviewed.   Resolved Hospital Problem list   Hypokalemia, Hypophosphatemia, AKI from ATN 2nd to sepsis, Urinary retention  Assessment & Plan:   Acute hypoxic/hypercapnic respiratory failure and ARDS Aspiration pneumonia with Serratia, Enterobacter -Completed full course of antibiotics -Agitation management with Precedex -Not able to wean at present  Fever -T-max of 100.6 -A-line and central line discontinued 8/21 -Procalcitonin not elevated -Completed cefepime 8/23  Status epilepticus Polysubstance abuse History of depression/bipolar disorder/schizophrenia -Antiepileptics being optimized  Abnormal EKG with concern for ST elevation MI -Cardiac catheterization negative for significant coronary artery disease -Echocardiogram with severe left ventricular hypertrophy, ejection fraction of 50 to 55%  Hypertension -Pulmonary vascular on hold - Increase metoprolol for tachycardia  Anemia critical illness and chronic illness -Transfuse per protocol   Best Practice (right click and "Reselect all SmartList Selections" daily)  Diet/type: tubefeeds DVT prophylaxis: lovenox GI prophylaxis: protonix Code Status:  full code Last date of multidisciplinary goals of care discussion 8/18   Labs    CMP Latest Ref Rng & Units 12/28/2020 12/27/2020 12/25/2020  Glucose 70 - 99 mg/dL 105(H) 130(H) 109(H)  BUN 6 - 20 mg/dL 32(H) 37(H) 37(H)  Creatinine 0.61 - 1.24 mg/dL 0.75 0.97 1.11  Sodium 135 - 145 mmol/L 142 143 145  Potassium 3.5 - 5.1 mmol/L 4.4 4.7 4.6  Chloride 98 - 111 mmol/L 110 111 112(H)  CO2  22 - 32 mmol/L '25 27 25  '$ Calcium 8.9 - 10.3 mg/dL 8.1(L) 8.1(L) 7.9(L)   Total Protein 6.5 - 8.1 g/dL 5.7(L) - -  Total Bilirubin 0.3 - 1.2 mg/dL 1.2 - -  Alkaline Phos 38 - 126 U/L 85 - -  AST 15 - 41 U/L 165(H) - -  ALT 0 - 44 U/L 129(H) - -    CBC Latest Ref Rng & Units 12/28/2020 12/27/2020 12/25/2020  WBC 4.0 - 10.5 K/uL 21.9(H) 22.6(H) 23.5(H)  Hemoglobin 13.0 - 17.0 g/dL 9.7(L) 9.6(L) 9.9(L)  Hematocrit 39.0 - 52.0 % 29.4(L) 29.2(L) 29.3(L)  Platelets 150 - 400 K/uL 287 240 185    ABG    Component Value Date/Time   PHART 7.440 12/22/2020 1014   PCO2ART 37.3 12/22/2020 1014   PO2ART 78.6 (L) 12/22/2020 1014   HCO3 24.5 12/22/2020 1014   TCO2 24 12/21/2020 1507   ACIDBASEDEF 1.0 12/21/2020 1101   O2SAT 94.6 12/22/2020 1014    CBG (last 3)  Recent Labs    12/27/20 2322 12/28/20 0334 12/28/20 0716  GLUCAP 99 96 98   The patient is critically ill with multiple organ systems failure and requires high complexity decision making for assessment and support, frequent evaluation and titration of therapies, application of advanced monitoring technologies and extensive interpretation of multiple databases. Critical Care Time devoted to patient care services described in this note independent of APP/resident time (if applicable)  is 32 minutes.   Sherrilyn Rist MD [facility] Personal pager: See Amion If unanswered, please page CCM On-call: [phone]

## 2020-12-28 NOTE — Progress Notes (Signed)
Phenytoin Follow-Up Consult Indication: Seizures  No Known Allergies  Patient Measurements: Height: '5\' 11"'  (180.3 cm) Weight: 88.2 kg (194 lb 7.1 oz) IBW/kg (Calculated) : 75.3   Body mass index is 27.12 kg/m.   Vital signs: Temp: 98.8 F (37.1 C) (08/25 0718) Temp Source: Oral (08/25 0718) BP: 139/81 (08/25 0600) Pulse Rate: 134 (08/25 0726)  Labs: Lab Results  Component Value Date/Time   Albumin 1.5 (L) 12/28/2020 0149   Phenytoin Lvl <2.5 (L) 12/28/2020 0149   Lab Results  Component Value Date   PHENYTOIN <2.5 (L) 12/28/2020   Estimated Creatinine Clearance: 122.9 mL/min (by C-G formula based on SCr of 0.75 mg/dL).   Medications:  Scheduled:   amLODipine  10 mg Per Tube Daily   amoxicillin-clavulanate  1 tablet Per Tube Q12H   chlorhexidine gluconate (MEDLINE KIT)  15 mL Mouth Rinse BID   Chlorhexidine Gluconate Cloth  6 each Topical Daily   docusate  100 mg Per Tube BID   enoxaparin (LOVENOX) injection  40 mg Subcutaneous Q24H   feeding supplement (PROSource TF)  45 mL Per Tube Daily   free water  30 mL Per Tube Q4H   lacosamide  100 mg Per Tube Q12H   levETIRAcetam  1,500 mg Per Tube Q12H   liver oil-zinc oxide   Topical BID   mouth rinse  15 mL Mouth Rinse 10 times per day   metoprolol tartrate  50 mg Per Tube TID   pantoprazole sodium  40 mg Per Tube Daily   phenytoin (DILANTIN) IV  100 mg Intravenous Q6H   polyethylene glycol  17 g Per Tube Daily   sodium chloride flush  10-40 mL Intracatheter Q12H   sodium chloride flush  3 mL Intravenous Q12H   Infusions:   sodium chloride Stopped (12/27/20 1120)   sodium chloride     dexmedetomidine (PRECEDEX) IV infusion     feeding supplement (VITAL 1.5 CAL) Stopped (12/27/20 1307)   PRN: sodium chloride, acetaminophen, docusate, fentaNYL, fentaNYL, fentaNYL (SUBLIMAZE) injection, hydrALAZINE, midazolam, polyethylene glycol, prochlorperazine, sodium chloride flush, sodium chloride flush  Assessment:  Phenytoin has been trending down, likely as a result of co-administration with TF. Phenytoin was switched from IV on 8/21 which coincides with drop in corrected phenytoin. After discussion with Dr. Hortense Ramal, phenytoin dose increased to 100 mg Q6H PT while on TF 8/23. With phenytoin level plummeting to <2.5 (uncorrected) on 8/24, phenytoin was switched back to IV. Dr. Hortense Ramal emphasized we are trying to prevent adding other agents so as to assess neural activity and allow the patient to wake. Precedex is currently being used as sedative of choice. Phenytoin level continues to be low 8/25 AM (<2.5). However, with increased dose on 8/23, and switch to IV 8/24, we will continue with current regimen and make further adjustments based on daily levels when phenytoin is closer to steady state (~5-7d). Phenobarbital was discontinued 8/24 after discussion with Dr. Hortense Ramal to see if patient's neurologic function returns. Will continue to follow levels to ensure the medication has left his system.   8/13 DPH lvl < 2.5, albumin 3.6 8/14 DPH lvl 15 8/15 DPH lvl 14.8, albumin 2.7 >> corrected to 23  mcg/mL. 8/16 DPH lvl 11.8, albumin 2.7 >> corrected to 18 8/17 DPH lvl 11.2, albumin 2.0>>corrected to 22   8/18 DPH lvl 11.2, albumin 1.7>>corrected to 25.45 8/19 Free DPH 3.5  8/20 DPH 10.2, albumin 1.6>>corrected to 18.9 8/21 DPH 8.1, albumin 1.6>>corrected to 15 8/22 DPH 5.5, albumin 1.6>>corrected to  13  8/23 DPH 2.9, albumin 1.6>> corrected to 6.9 8/24 DPH <2.5, albumin 1.6>>corrected to 5.95 (imprecise as level is <2.5)  8/25 DPH <2.5, albumin 1.5>>corrected to ~6 (imprecise as level is <2.5)    8/16 phenobarb level 20.6  8/18 phenobarb level 21.6  8/19 Phenobarb 23.7 8/20 Phenobarb 25.1 8/21 Phenobarb 23.2 8/22 Phenobarb 26.4  8/23 Phenobarb 26.4  8/24 Phenobarb 25.9  8/25 Phenobarb 25.1  Goals of care:  Total phenytoin level: 10-20 mcg/ml Free phenytoin level: 1-2 mcg/ml  Plan:   CONTINUEphenytoin 100  mg Q6H IV  Pharmacy will continue to follow regarding obtaining total phenytoin levels and dose adjustments if corrected phenytoin levels falls below 10. F/U daily phenytoin/phenobarb levels  F/U neuro recs  Monitor for seizure activity   Adria Dill, PharmD PGY-1 Acute Care Resident  12/28/2020 9:33 AM

## 2020-12-28 NOTE — Progress Notes (Signed)
Nutrition Follow-up  DOCUMENTATION CODES:   Not applicable  INTERVENTION:   Continue TF via post pyloric Cortrak: Vital 1.5 at 65 ml/h (1560 ml per day) Increase Prosource TF to 45 ml TID  Provides 2460 kcal, 138 gm protein, 1192 ml free water daily.  NUTRITION DIAGNOSIS:   Inadequate oral intake related to inability to eat as evidenced by NPO status.  Ongoing   GOAL:   Patient will meet greater than or equal to 90% of their needs  Met with TF  MONITOR:   Vent status, Labs, Weight trends, TF tolerance, Skin, I & O's  REASON FOR ASSESSMENT:   Consult, Ventilator Enteral/tube feeding initiation and management  ASSESSMENT:   45 year old man with prior history of seizures and substance abuse found on the floor unresponsive by his wife.  Multiple witnessed seizures by wife and EMS, received 8 mg of IV Versed in the field and continued to seize on arrival to the ED, received 4 mg of IV Ativan, intubated for airway protection and started on propofol drip.  Loaded with Keppra.  Versed drip was added since he continued to have twitching on maximum dose of propofol.  Head CT was negative.  UDS positive for cocaine and THC  Patient remains on vent support. Being treated for acute respiratory failure and ARDS. He may require trach and PEG placement. Patient with ongoing fevers; finished abx course for serratia/enterobacter.   S/P cardiac cath 8/22; negative for significant CAD. TF was held for an episode of emesis 8/24. Cortrak placed with tip in the distal duodenum. TF resumed this morning with Vital 1.5 at 65 ml/h and Prosource TF 45 ml via tube once daily.   Patient is currently intubated on ventilator support MV: 16.1 L/min Temp (24hrs), Avg:99.9 F (37.7 C), Min:98.7 F (37.1 C), Max:101.4 F (38.6 C)   Labs reviewed. Phos 2.4 CBG: 96-98  Medications reviewed and include colace, keppra, protonix, dilantin, miralax, precedex.  Admission weight 81.3 kg Current weight  88.2 kg I/O +11 L since admission UOP 2440 ml x 24 hours Stool output 225 ml x 24 hours via rectal tube  Diet Order:   Diet Order             Diet NPO time specified  Diet effective now                   EDUCATION NEEDS:   Not appropriate for education at this time  Skin:  Skin Assessment: Skin Integrity Issues: Skin Integrity Issues:: Other (Comment) Other: skin tear L buttocks, MASD to coccyx  Last BM:  8/25 rectal tube  Height:   Ht Readings from Last 1 Encounters:  12/16/20 5' 11"  (1.803 m)    Weight:   Wt Readings from Last 1 Encounters:  12/28/20 88.2 kg    Ideal Body Weight:  78.2 kg  BMI:  Body mass index is 27.12 kg/m.  Estimated Nutritional Needs:   Kcal:  2450  Protein:  120-140 gm  Fluid:  >/= 2.4 L    Lucas Mallow, RD, LDN, CNSC Please refer to Amion for contact information.

## 2020-12-28 NOTE — Progress Notes (Signed)
[facility] ADULT ICU REPLACEMENT PROTOCOL   The patient does apply for the [facility] Adult ICU Electrolyte Replacment Protocol based on the criteria listed below:   1.Exclusion criteria: TCTS patients, ECMO patients and Hypothermia Protocol, and   Dialysis patients 2. Is GFR >/= 30 ml/min? Yes.    Patient's GFR today is >60 3. Is SCr </= 2? Yes.   Patient's SCr is 0.75 mg/dL 4. Did SCr increase >/= 0.5 in 24 hours? No. 5.Pt's weight >40kg  Yes.   6. Abnormal electrolyte(s): mag 1.8  7. Electrolytes replaced per protocol 8.  Call MD STAT for K+ </= 2.5, Phos </= 1, or Mag </= 1 Physician:  n/a  Darlys Gales 12/28/2020 2:56 AM

## 2020-12-29 ENCOUNTER — Inpatient Hospital Stay (HOSPITAL_COMMUNITY): Payer: Medicare HMO

## 2020-12-29 DIAGNOSIS — R000 Tachycardia, unspecified: Secondary | ICD-10-CM | POA: Diagnosis not present

## 2020-12-29 LAB — COMPREHENSIVE METABOLIC PANEL
ALT: 311 U/L — ABNORMAL HIGH (ref 0–44)
AST: 301 U/L — ABNORMAL HIGH (ref 15–41)
Albumin: 1.6 g/dL — ABNORMAL LOW (ref 3.5–5.0)
Alkaline Phosphatase: 105 U/L (ref 38–126)
Anion gap: 6 (ref 5–15)
BUN: 30 mg/dL — ABNORMAL HIGH (ref 6–20)
CO2: 26 mmol/L (ref 22–32)
Calcium: 8 mg/dL — ABNORMAL LOW (ref 8.9–10.3)
Chloride: 111 mmol/L (ref 98–111)
Creatinine, Ser: 0.77 mg/dL (ref 0.61–1.24)
GFR, Estimated: >60 mL/min (ref >60)
Glucose, Bld: 149 mg/dL — ABNORMAL HIGH (ref 70–99)
Potassium: 4.6 mmol/L (ref 3.5–5.1)
Sodium: 143 mmol/L (ref 135–145)
Total Bilirubin: 1.2 mg/dL (ref 0.3–1.2)
Total Protein: 5.9 g/dL — ABNORMAL LOW (ref 6.5–8.1)

## 2020-12-29 LAB — GLUCOSE, CAPILLARY
Glucose-Capillary: 115 mg/dL — ABNORMAL HIGH (ref 70–99)
Glucose-Capillary: 118 mg/dL — ABNORMAL HIGH (ref 70–99)
Glucose-Capillary: 122 mg/dL — ABNORMAL HIGH (ref 70–99)
Glucose-Capillary: 125 mg/dL — ABNORMAL HIGH (ref 70–99)
Glucose-Capillary: 130 mg/dL — ABNORMAL HIGH (ref 70–99)

## 2020-12-29 LAB — CULTURE, RESPIRATORY W GRAM STAIN

## 2020-12-29 LAB — MAGNESIUM: Magnesium: 2.1 mg/dL (ref 1.7–2.4)

## 2020-12-29 LAB — PHENOBARBITAL LEVEL: Phenobarbital: 21.5 ug/mL (ref 15.0–30.0)

## 2020-12-29 LAB — PROCALCITONIN: Procalcitonin: 0.8 ng/mL

## 2020-12-29 LAB — VITAMIN B1: Vitamin B1 (Thiamine): 202.6 nmol/L — ABNORMAL HIGH (ref 66.5–200.0)

## 2020-12-29 LAB — PHENYTOIN LEVEL, TOTAL: Phenytoin Lvl: <2.5 ug/mL — ABNORMAL LOW (ref 10.0–20.0)

## 2020-12-29 LAB — PHOSPHORUS: Phosphorus: 3 mg/dL (ref 2.5–4.6)

## 2020-12-29 LAB — D-DIMER, QUANTITATIVE: D-Dimer, Quant: 5.17 ug/mL-FEU — ABNORMAL HIGH (ref 0.00–0.50)

## 2020-12-29 MED ORDER — FENTANYL BOLUS VIA INFUSION
50.0000 ug | INTRAVENOUS | Status: DC | PRN
  Administered 2020-12-29: 50 ug via INTRAVENOUS
  Administered 2020-12-29 – 2020-12-30 (×3): 100 ug via INTRAVENOUS
  Administered 2020-12-30: 50 ug via INTRAVENOUS
  Administered 2020-12-30: 100 ug via INTRAVENOUS
  Administered 2020-12-30 – 2020-12-31 (×2): 50 ug via INTRAVENOUS
  Administered 2020-12-31 – 2021-01-01 (×7): 100 ug via INTRAVENOUS
  Filled 2020-12-29: qty 100

## 2020-12-29 MED ORDER — FENTANYL 2500MCG IN NS 250ML (10MCG/ML) PREMIX INFUSION
50.0000 ug/h | INTRAVENOUS | Status: DC
  Administered 2020-12-29: 50 ug/h via INTRAVENOUS
  Administered 2020-12-30: 100 ug/h via INTRAVENOUS
  Administered 2020-12-30 – 2020-12-31 (×4): 300 ug/h via INTRAVENOUS
  Administered 2021-01-01: 250 ug/h via INTRAVENOUS
  Administered 2021-01-01 (×2): 300 ug/h via INTRAVENOUS
  Administered 2021-01-02: 225 ug/h via INTRAVENOUS
  Filled 2020-12-29 (×10): qty 250

## 2020-12-29 MED ORDER — FENTANYL CITRATE (PF) 100 MCG/2ML IJ SOLN
50.0000 ug | Freq: Once | INTRAMUSCULAR | Status: AC
  Administered 2020-12-29: 50 ug via INTRAVENOUS

## 2020-12-29 MED ORDER — THIAMINE HCL 100 MG/ML IJ SOLN
500.0000 mg | Freq: Three times a day (TID) | INTRAMUSCULAR | Status: DC
  Administered 2020-12-29 – 2020-12-30 (×3): 500 mg via INTRAVENOUS
  Filled 2020-12-29 (×6): qty 5

## 2020-12-29 MED ORDER — THIAMINE HCL 100 MG/ML IJ SOLN: 100.0000 mg | INTRAMUSCULAR | Status: DC

## 2020-12-29 NOTE — Progress Notes (Addendum)
LTM maint complete - no skin breakdown Maintenance Pz P8 T8 [facility] monitored, Event button test confirmed by [facility].

## 2020-12-29 NOTE — Progress Notes (Addendum)
NAME:  Luis Hunter, MRN:  [id number], DOB:  [birth year], LOS: 36 ADMISSION DATE:  12/16/2020, CONSULTATION DATE:  12/29/2020  REFERRING MD:  Starleen Blue, EDP [facility], CHIEF COMPLAINT:  seizures, intubated   History of Present Illness:  45 yo male smoker found unresponsive by his wife.  Noted to have multiple seizures witnessed by his wife.  Intubated for airway protection and loaded with keppra.  UDS positive for cocaine and THC.  Transferred from [facility] to [facility] for continuous EEG monitoring.  Pertinent  Medical History  Schizophrenia, Seizures, Bipolar, Depression, Cocaine abuse  Significant Hospital Events:  8/13 intubated, transferred to [facility] 8/16 respiratory aspirate with gram positive cocci, few gram negative rods 8/16 started on unasyn 8/17 unasyn discontinued 8/18 started on zosyn 8/20 vent dyssynchrony >> add precedex; EEG with periodic triphasic morphology and generalized slowing 8/22 cardiac catheterization for STEMI-no significant coronary artery disease discovered 8/23 remains encephalopathic, required escalation of sedatives for vent dyssynchrony  8/24 MRI-mastoid fullness, no intracerebral abnormality 8/25 started augmentin 8/26 rare serratia trach aspirate, suspect colony since this was treated earlier this admit  Interim History / Subjective:  Weaning precedex this morning   After dexmed weaned off and POCUS performed, profound diuresis, tachypnea. Increased HR HTN   Objective   Blood pressure 118/79, pulse (!) 107, temperature 98.1 F (36.7 C), temperature source Axillary, resp. rate (!) 32, height '5\' 11"'$  (1.803 m), weight 88 kg, SpO2 97 %.    Vent Mode: PRVC FiO2 (%):  [40 %-100 %] 40 % Set Rate:  [20 bmp-28 bmp] 20 bmp Vt Set:  [550 mL] 550 mL PEEP:  [5 cmH20] 5 cmH20 Plateau Pressure:  [25 cmH20-32 cmH20] 26 cmH20   Intake/Output Summary (Last 24 hours) at 12/29/2020 1055 Last data filed at 12/29/2020 0408 Gross per 24 hour  Intake 878.78 ml  Output 1830 ml  Net  -951.22 ml   Filed Weights   12/27/20 0444 12/28/20 0500 12/29/20 0447  Weight: 88 kg 88.2 kg 88 kg    Examination: General: Critically and chronically ill appearing. Intubated, recently sedated NAD Neuro: +corneal reflexes. No response to painful stimuli. Pinpoint pupils. Overbreathing vent HEENT: NCAT ETT secure. Moderate thick secretions  Cardiovascular: Tachycardic  Lungs: Scattered rhonchi, symmetrical chest expansion, tachypneic. Mechanically ventilated  Abdomen: Soft round + bowel sounds  Musculoskeletal:  no acute joint deformity. No cyanosis.  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 Has had a consistent RR around 30 for several days. Not sure if this is neuro related, secretion related or if other etiology. Has had low grade temp, tachycardia, earlier this admit had ST changes (clean cath) -- ?PE.  P -7d course augmentin for sinusitis -not adding coverage for serratia, think colonized (dw CCM MD) -bedside POCUS with CCM MD assuring against DVT and RV dysfunction. Adding a Ddimer but suspicion lower for PE  -has been intubated for almost 2 weeks, is on minimal vent settings but we are not close to extubation-- Probably need to start discussing tracheostomy   Acute metabolic encephalopathy - multifactorial, postictal, medication related Status epilepticus, improved  Epilepsy with breakthrough seizures P -AEDs per neuro -PRN versed -dexmed and fent gtt   Polysubstance abuse Depression, Bipolar  disorder, Schizophrenia  -gets monthly abilify injection  P -when appropriate, cessation counseling   Abnormal ECG with ST changes -- felt due to severe LVH -Cardiac  catheterization negative for significant coronary artery disease -Echocardiogram with severe left ventricular hypertrophy, ejection fraction of 50 to 55% P -cards signed off   Elevated LFTs P -trend CMP    Best Practice (right click and "Reselect all SmartList Selections" daily)  Diet/type: tubefeeds DVT prophylaxis: lovenox GI prophylaxis: protonix Code Status:  full code Last date of multidisciplinary goals of care discussion 8/18   Labs    CMP Latest Ref Rng & Units 12/29/2020 12/28/2020 12/28/2020  Glucose 70 - 99 mg/dL 149(H) - 105(H)  BUN 6 - 20 mg/dL 30(H) - 32(H)  Creatinine 0.61 - 1.24 mg/dL 0.77 - 0.75  Sodium 135 - 145 mmol/L 143 143 142  Potassium 3.5 - 5.1 mmol/L 4.6 4.3 4.4  Chloride 98 - 111 mmol/L 111 - 110  CO2 22 - 32 mmol/L 26 - 25  Calcium 8.9 - 10.3 mg/dL 8.0(L) - 8.1(L)  Total Protein 6.5 - 8.1 g/dL 5.9(L) - 5.7(L)  Total Bilirubin 0.3 - 1.2 mg/dL 1.2 - 1.2  Alkaline Phos 38 - 126 U/L 105 - 85  AST 15 - 41 U/L 301(H) - 165(H)  ALT 0 - 44 U/L 311(H) - 129(H)    CBC Latest Ref Rng & Units 12/28/2020 12/28/2020 12/27/2020  WBC 4.0 - 10.5 K/uL - 21.9(H) 22.6(H)  Hemoglobin 13.0 - 17.0 g/dL 10.9(L) 9.7(L) 9.6(L)  Hematocrit 39.0 - 52.0 % 32.0(L) 29.4(L) 29.2(L)  Platelets 150 - 400 K/uL - 287 240    ABG    Component Value Date/Time   PHART 7.417 12/28/2020 0940   PCO2ART 42.6 12/28/2020 0940   PO2ART 88 12/28/2020 0940   HCO3 27.4 12/28/2020 0940   TCO2 29 12/28/2020 0940   ACIDBASEDEF 1.0 12/21/2020 1101   O2SAT 97.0 12/28/2020 0940    CBG (last 3)  Recent Labs    12/28/20 2344 12/29/20 0330 12/29/20 0809  GLUCAP 122* 115* 118*   CRITICAL CARE Performed by: Cristal Generous   Total critical care time: 40 minutes  Critical care time was exclusive of separately billable procedures and treating other patients. Critical care was necessary to treat or prevent imminent or life-threatening deterioration.  Critical care was time spent  personally by me on the following activities: development of treatment plan with patient and/or surrogate as well as nursing, discussions with consultants, evaluation of patient's response to treatment, examination of patient, obtaining history from patient or surrogate, ordering and performing treatments and interventions, ordering and review of laboratory studies, ordering and review of radiographic studies, pulse oximetry and re-evaluation of patient's condition.  Eliseo Gum MSN, AGACNP-BC [place] for pager  12/29/2020, 10:55 AM

## 2020-12-29 NOTE — Progress Notes (Signed)
Phenytoin Follow-Up Consult Indication: Seizures  No Known Allergies  Patient Measurements: Height: '5\' 11"'  (180.3 cm) Weight: 88 kg (194 lb 0.1 oz) IBW/kg (Calculated) : 75.3   Body mass index is 27.06 kg/m.   Vital signs: Temp: 98.1 F (36.7 C) (08/26 0811) Temp Source: Axillary (08/26 0811) BP: 118/79 (08/26 0800) Pulse Rate: 107 (08/26 0800)  Labs: Lab Results  Component Value Date/Time   Albumin 1.6 (L) 12/29/2020 0629   Phenytoin Lvl <2.5 (L) 12/29/2020 0629   Lab Results  Component Value Date   PHENYTOIN <2.5 (L) 12/29/2020   Estimated Creatinine Clearance: 122.9 mL/min (by C-G formula based on SCr of 0.77 mg/dL).   Medications:  Scheduled:   amLODipine  10 mg Per Tube Daily   amoxicillin-clavulanate  1 tablet Per Tube Q12H   chlorhexidine gluconate (MEDLINE KIT)  15 mL Mouth Rinse BID   Chlorhexidine Gluconate Cloth  6 each Topical Daily   docusate  100 mg Per Tube BID   enoxaparin (LOVENOX) injection  40 mg Subcutaneous Q24H   feeding supplement (PROSource TF)  45 mL Per Tube Daily   free water  30 mL Per Tube Q4H   ipratropium-albuterol  3 mL Nebulization QID   lacosamide  100 mg Per Tube Q12H   levETIRAcetam  1,500 mg Per Tube Q12H   liver oil-zinc oxide   Topical BID   mouth rinse  15 mL Mouth Rinse 10 times per day   metoprolol tartrate  75 mg Per Tube BID   pantoprazole sodium  40 mg Per Tube Daily   phenytoin (DILANTIN) IV  100 mg Intravenous Q6H   polyethylene glycol  17 g Per Tube Daily   sodium chloride flush  10-40 mL Intracatheter Q12H   sodium chloride flush  3 mL Intravenous Q12H   [START ON 01/01/2021] thiamine injection  100 mg Intravenous Q24H   Infusions:   sodium chloride 10 mL/hr at 12/29/20 0400   sodium chloride     dexmedetomidine (PRECEDEX) IV infusion 1.2 mcg/kg/hr (12/29/20 0756)   feeding supplement (VITAL 1.5 CAL) 1,000 mL (12/29/20 0514)   fentaNYL infusion INTRAVENOUS 50 mcg/hr (12/29/20 1149)   thiamine injection      PRN: sodium chloride, acetaminophen, docusate, fentaNYL, hydrALAZINE, midazolam, polyethylene glycol, prochlorperazine, sodium chloride flush, sodium chloride flush  Assessment: Phenytoin has been trending down, likely as a result of co-administration with TF. Phenytoin was switched from IV on 8/21 which coincides with drop in corrected phenytoin. After discussion with Dr. Hortense Ramal, phenytoin dose increased to 100 mg Q6H PT while on TF 8/23. With phenytoin level plummeting to <2.5 (uncorrected) on 8/24, phenytoin was switched back to IV. Dr. Hortense Ramal emphasized we are trying to prevent adding other agents so as to assess neural activity and allow the patient to wake. Precedex is currently being used as sedative of choice. Fentanyl gtt added back 8/26 for continued vent desynchrony/increased WOB, diaphoresis, and increased oxygen requirement. Phenytoin level continues to be low 8/26 AM (<2.5). However, with increased dose on 8/23, and switch to IV 8/24, we will continue with current regimen and make further adjustments based on daily levels when phenytoin is closer to steady state (~5-7d). Phenobarbital was discontinued 8/24 after discussion with Dr. Hortense Ramal to see if patient's neurologic function returns. Will continue to follow levels to ensure the medication has left his system.   8/13 DPH lvl < 2.5, albumin 3.6 8/14 DPH lvl 15 8/15 DPH lvl 14.8, albumin 2.7 >> corrected to 23  mcg/mL. 8/16 DPH  lvl 11.8, albumin 2.7 >> corrected to 18 8/17 DPH lvl 11.2, albumin 2.0>>corrected to 22   8/18 DPH lvl 11.2, albumin 1.7>>corrected to 25.45 8/19 Free DPH 3.5  8/20 DPH 10.2, albumin 1.6>>corrected to 18.9 8/21 DPH 8.1, albumin 1.6>>corrected to 15 8/22 DPH 5.5, albumin 1.6>>corrected to 13  8/23 DPH 2.9, albumin 1.6>> corrected to 6.9 8/24 DPH <2.5, albumin 1.6>>corrected to 5.95 (imprecise as level is <2.5)  8/25 DPH <2.5, albumin 1.5>>corrected to ~6 (imprecise as level is <2.5)  8/26 DPH <2.5, albumin  1.5>>corrected to ~6 (imprecise as level is <2.5)   8/16 phenobarb level 20.6  8/18 phenobarb level 21.6  8/19 Phenobarb 23.7 8/20 Phenobarb 25.1 8/21 Phenobarb 23.2 8/22 Phenobarb 26.4  8/23 Phenobarb 26.4  8/24 Phenobarb 25.9  8/25 Phenobarb 25.1 8/26 Phenobarb 21.5  Goals of care:  Total phenytoin level: 10-20 mcg/ml Free phenytoin level: 1-2 mcg/ml  Plan:   CONTINUE phenytoin 100 mg Q6H IV  Pharmacy will continue to follow regarding obtaining total phenytoin levels and dose adjustments if corrected phenytoin levels falls below 10. F/U daily phenytoin/phenobarb levels  F/U neuro recs  Monitor for seizure activity   Adria Dill, PharmD PGY-1 Acute Care Resident  12/29/2020 3:20 PM

## 2020-12-29 NOTE — Progress Notes (Addendum)
Subjective:  Remains minimally responsive  Overnight: frequent episodes of inc HR, inc RR, inc WOB, diaphoresis. increased oxygen requirement, poor air movement bilaterally on auscultation Received 4 doses of PRN midazolam 4 mg  On precedex since 8/25 AM as well Nursing reports he has also had episodes of slightly moving bilateral upper extremities in a flexion posture  ROS: Unable to obtain due to poor mental status   Current Facility-Administered Medications:    0.9 %  sodium chloride infusion, 250 mL, Intravenous, Continuous, End, Harrell Gave, MD, Last Rate: 10 mL/hr at 12/29/20 0400, Infusion Verify at 12/29/20 0400   0.9 %  sodium chloride infusion, 250 mL, Intravenous, PRN, End, Harrell Gave, MD   acetaminophen (TYLENOL) 160 MG/5ML solution 650 mg, 650 mg, Per Tube, Q4H PRN, End, Harrell Gave, MD, 650 mg at 12/27/20 1426   amLODipine (NORVASC) tablet 10 mg, 10 mg, Per Tube, Daily, Olalere, Adewale A, MD, 10 mg at 12/29/20 0916   amoxicillin-clavulanate (AUGMENTIN) 875-125 MG per tablet 1 tablet, 1 tablet, Per Tube, Q12H, Bowser, Laurel Dimmer, NP, 1 tablet at 12/29/20 0915   chlorhexidine gluconate (MEDLINE KIT) (PERIDEX) 0.12 % solution 15 mL, 15 mL, Mouth Rinse, BID, End, Harrell Gave, MD, 15 mL at 12/29/20 0916   Chlorhexidine Gluconate Cloth 2 % PADS 6 each, 6 each, Topical, Daily, End, Christopher, MD, 6 each at 12/29/20 0917   dexmedetomidine (PRECEDEX) 400 MCG/100ML (4 mcg/mL) infusion, 0.4-1.2 mcg/kg/hr, Intravenous, Titrated, Olalere, Adewale A, MD, Last Rate: 26.5 mL/hr at 12/29/20 0756, 1.2 mcg/kg/hr at 12/29/20 0756   docusate (COLACE) 50 MG/5ML liquid 100 mg, 100 mg, Per Tube, BID PRN, End, Harrell Gave, MD   docusate (COLACE) 50 MG/5ML liquid 100 mg, 100 mg, Per Tube, BID, End, Christopher, MD, 100 mg at 12/29/20 0915   enoxaparin (LOVENOX) injection 40 mg, 40 mg, Subcutaneous, Q24H, End, Christopher, MD, 40 mg at 12/28/20 2222   feeding supplement (PROSource TF) liquid 45 mL, 45 mL,  Per Tube, Daily, End, Christopher, MD, 45 mL at 12/29/20 0915   feeding supplement (VITAL 1.5 CAL) liquid 1,000 mL, 1,000 mL, Per Tube, Continuous, End, Christopher, MD, Last Rate: 65 mL/hr at 12/29/20 0514, 1,000 mL at 12/29/20 0514   fentaNYL (SUBLIMAZE) bolus via infusion 50-100 mcg, 50-100 mcg, Intravenous, Q15 min PRN, Bowser, Grace E, NP   fentaNYL 2574mg in NS 2532m(1043mml) infusion-PREMIX, 50-200 mcg/hr, Intravenous, Continuous, Bowser, Grace E, NP, Last Rate: 5 mL/hr at 12/29/20 1149, 50 mcg/hr at 12/29/20 1149   free water 30 mL, 30 mL, Per Tube, Q4H, End, Christopher, MD, 30 mL at 12/29/20 1152   hydrALAZINE (APRESOLINE) injection 10 mg, 10 mg, Intravenous, Q6H PRN, End, Christopher, MD, 10 mg at 12/26/20 1020   ipratropium-albuterol (DUONEB) 0.5-2.5 (3) MG/3ML nebulizer solution 3 mL, 3 mL, Nebulization, QID, Olalere, Adewale A, MD, 3 mL at 12/29/20 1106   lacosamide (VIMPAT) tablet 100 mg, 100 mg, Per Tube, Q12H, YadLora HavensD, 100 mg at 12/29/20 0916   levETIRAcetam (KEPPRA) 100 MG/ML solution 1,500 mg, 1,500 mg, Per Tube, Q12H, End, Christopher, MD, 1,500 mg at 12/29/20 0915   liver oil-zinc oxide (DESITIN) 40 % ointment, , Topical, BID, Olalere, Adewale A, MD, Given at 12/29/20 0917   MEDLINE mouth rinse, 15 mL, Mouth Rinse, 10 times per day, End, Christopher, MD, 15 mL at 12/29/20 1152   metoprolol tartrate (LOPRESSOR) tablet 75 mg, 75 mg, Per Tube, BID, Olalere, Adewale A, MD, 75 mg at 12/29/20 0916   midazolam (VERSED) injection 4 mg, 4 mg, Intravenous,  Q2H PRN, End, Harrell Gave, MD, 4 mg at 12/29/20 1050   pantoprazole sodium (PROTONIX) 40 mg/20 mL oral suspension 40 mg, 40 mg, Per Tube, Daily, End, Harrell Gave, MD, 40 mg at 12/29/20 0915   phenytoin (DILANTIN) injection 100 mg, 100 mg, Intravenous, Q6H, Hortense Ramal, Priyanka O, MD, 100 mg at 12/29/20 1202   polyethylene glycol (MIRALAX / GLYCOLAX) packet 17 g, 17 g, Per Tube, Daily PRN, End, Harrell Gave, MD   polyethylene  glycol (MIRALAX / GLYCOLAX) packet 17 g, 17 g, Per Tube, Daily, End, Christopher, MD, 17 g at 12/29/20 9485   prochlorperazine (COMPAZINE) injection 10 mg, 10 mg, Intravenous, Q6H PRN, End, Harrell Gave, MD   sodium chloride flush (NS) 0.9 % injection 10-40 mL, 10-40 mL, Intracatheter, Q12H, End, Christopher, MD, 10 mL at 12/29/20 0921   sodium chloride flush (NS) 0.9 % injection 10-40 mL, 10-40 mL, Intracatheter, PRN, End, Christopher, MD, 30 mL at 12/23/20 1016   sodium chloride flush (NS) 0.9 % injection 3 mL, 3 mL, Intravenous, Q12H, End, Christopher, MD, 3 mL at 12/29/20 0921   sodium chloride flush (NS) 0.9 % injection 3 mL, 3 mL, Intravenous, PRN, End, Christopher, MD, 3 mL at 12/29/20 0918   thiamine 567m in normal saline (514m IVPB, 500 mg, Intravenous, Q8H **FOLLOWED BY** [START ON 01/01/2021] thiamine (B-1) injection 100 mg, 100 mg, Intravenous, Q24H, Fatemah Pourciau L, MD   Examination  Vital signs in last 24 hours: Temp:  [98.1 F (36.7 C)-100.2 F (37.9 C)] 98.1 F (36.7 C) (08/26 0811) Pulse Rate:  [95-133] 107 (08/26 0800) Resp:  [22-40] 32 (08/26 0800) BP: (102-162)/(59-101) 118/79 (08/26 0800) SpO2:  [85 %-100 %] 97 % (08/26 0800) FiO2 (%):  [40 %-100 %] 40 % (08/26 0731) Weight:  [88 kg] 88 kg (08/26 0447)  General: lying in bed, diaphoretic  CVS: pulse-normal rhythm, tachycardic  RS: intubated, uncomfortable on the vent, thick secretions in ET tube   Extremities: warm and diaphoretic, with some peripheral edema in bilateral hands  Neuro: comatose, barely opens eyes to noxious stimuli, PERRLA, at times he has a gaze deviation to the right but nursing reports at times he has had one to the left as well and at times on my evaluation his gaze is midline, corneal reflex present, gag reflex absent today, cough absent, does not withdraw to noxious stimuli in all 4 extremities     Basic Metabolic Panel: Recent Labs  Lab 12/24/20 0520 12/25/20 0142 12/27/20 0232  12/28/20 0149 12/28/20 0940 12/28/20 1032 12/28/20 1638 12/29/20 0629  NA 144 145 143 142 143  --   --  143  K 4.3 4.6 4.7 4.4 4.3  --   --  4.6  CL 111 112* 111 110  --   --   --  111  CO2 _0 --   --   --  26  GLUCOSE 141* 109* 130* 105*  --   --   --  149*  BUN 32* 37* 37* 32*  --   --   --  30*  CREATININE 1.06 1.11 0.97 0.75  --   --   --  0.77  CALCIUM 7.9* 7.9* 8.1* 8.1*  --   --   --  8.0*  MG  --   --   --  1.8  --  2.0 2.1 2.1  PHOS  --   --   --  2.4*  --  3.5 2.9 3.0     CBC: Recent Labs  Lab 12/23/20 0500 12/24/20 0520 12/25/20 0142 12/27/20 0232 12/28/20 0149 12/28/20 0940  WBC 15.4* 18.6* 23.5* 22.6* 21.9*  --   NEUTROABS 11.5*  --   --  19.1*  --   --   HGB 10.2* 9.7* 9.9* 9.6* 9.7* 10.9*  HCT 29.4* 28.3* 29.3* 29.2* 29.4* 32.0*  MCV 85.2 86.0 87.2 88.2 87.2  --   PLT 178 171 185 240 287  --     Results for ABRAHM, MANCIA (MRN [id number]) as of 12/29/2020 09:01  Ref. Range 12/25/2020 01:42 12/26/2020 04:27 12/27/2020 02:32 12/28/2020 01:49 12/29/2020 06:29  PHENOBARBITAL Latest Ref Range: 15.0 - 30.0 ug/mL 26.4 26.4 25.9 25.1 21.5  Phenytoin Lvl Latest Ref Range: 10.0 - 20.0 ug/mL 5.5 (L) 2.9 (L) <2.5 (L) <2.5 (L) <2.5 (L)     Ref. Range 12/22/2020 10:05 12/28/2020 11:55  Amphetamines Latest Ref Range: NONE DETECTED  NONE DETECTED NONE DETECTED  Barbiturates Latest Ref Range: NONE DETECTED  POSITIVE (A) POSITIVE (A)  Benzodiazepines Latest Ref Range: NONE DETECTED  POSITIVE (A) POSITIVE (A)  Opiates Latest Ref Range: NONE DETECTED  NONE DETECTED NONE DETECTED  COCAINE Latest Ref Range: NONE DETECTED  POSITIVE (A) NONE DETECTED  Tetrahydrocannabinol Latest Ref Range: NONE DETECTED  POSITIVE (A) POSITIVE (A)   Imaging No new brain imaging overnight;  Most recent MRI brain was completed on 12/27/2020 due to his persistent somnolence and did not show any acute process there was a mild increase in flair signal within the CSF attributed to mechanical  ventilation and bilateral mastoid effusions and inflammatory changes of the paranasal sinuses   ASSESSMENT AND PLAN: 45 year old male with history of epilepsy who presented with status epilepticus in the setting of medication noncompliance.   Status epilepticus (resolved) Epilepsy with breakthrough seizure Acute encephalopathy, postictal and medication induced -Worsening again today though his best exam recent demonstrated minimal interaction. -Given we have been reducing his medications will repeat EEG -Alternatively these episodes may reflect a process such as autonomic storming though this is a diagnosis of exclusion -Most likely multifactorial encephalopathy due to infection, medications, drug use, prolonged postictal state.    Recommendations  -Normal TSH, folate and B12.  Vitamin B1 level pending, will add 500 mg q8hr thiamine empirically pending levels x 3 day course, followed by 100 mg daily as this is a low risk high benefit intervention -Continue Keppra to 1500 mg twice daily, Vimpat 100 mg twice daily, continue phenytoin 100 mg IV every 6 hours for now;  If there is seizure activity of EEG  Will first consider increasing vimpat to 200 mg BID  Then consider reloading phenytoin if his levels continue to be low  -Continue seizure precautions -As needed IV versed for clinical seizure-like activity, please push the button if this is administered so that we can correlate EEG -Discussed my assessment and plan with CCM team via secure chat -Management of rest of comorbidities per primary team  Lesleigh Noe MD-PhD [facility] [phone]  Available 7 AM to 7 PM, outside these hours please contact Neurologist on call listed on [place] Performed by: Lorenza Chick   Total critical care time: 45 minutes  Critical care time was exclusive of separately billable procedures and treating other patients.  Critical care was necessary to treat or prevent  imminent or life-threatening deterioration.  Critical care was time spent personally by me on the following activities: development of treatment plan with patient and/or surrogate as well as nursing, discussions with consultants,  evaluation of patient's response to treatment, examination of patient, obtaining history from patient or surrogate, ordering and performing treatments and interventions, ordering and review of laboratory studies, ordering and review of radiographic studies, pulse oximetry and re-evaluation of patient's condition.

## 2020-12-30 ENCOUNTER — Inpatient Hospital Stay (HOSPITAL_COMMUNITY): Payer: Medicare HMO

## 2020-12-30 DIAGNOSIS — J45901 Unspecified asthma with (acute) exacerbation: Secondary | ICD-10-CM | POA: Diagnosis not present

## 2020-12-30 DIAGNOSIS — J9600 Acute respiratory failure, unspecified whether with hypoxia or hypercapnia: Secondary | ICD-10-CM

## 2020-12-30 DIAGNOSIS — J9602 Acute respiratory failure with hypercapnia: Secondary | ICD-10-CM | POA: Diagnosis not present

## 2020-12-30 DIAGNOSIS — G9340 Encephalopathy, unspecified: Secondary | ICD-10-CM | POA: Diagnosis not present

## 2020-12-30 DIAGNOSIS — J9601 Acute respiratory failure with hypoxia: Secondary | ICD-10-CM | POA: Diagnosis not present

## 2020-12-30 LAB — POCT I-STAT 7, (LYTES, BLD GAS, ICA,H+H)
Acid-Base Excess: 5 mmol/L — ABNORMAL HIGH (ref 0.0–2.0)
Acid-Base Excess: 6 mmol/L — ABNORMAL HIGH (ref 0.0–2.0)
Bicarbonate: 32.9 mmol/L — ABNORMAL HIGH (ref 20.0–28.0)
Bicarbonate: 32.7 mmol/L — ABNORMAL HIGH (ref 20.0–28.0)
Calcium, Ion: 1.25 mmol/L (ref 1.15–1.40)
Calcium, Ion: 1.23 mmol/L (ref 1.15–1.40)
HCT: 31 % — ABNORMAL LOW (ref 39.0–52.0)
HCT: 29 % — ABNORMAL LOW (ref 39.0–52.0)
Hemoglobin: 10.5 g/dL — ABNORMAL LOW (ref 13.0–17.0)
Hemoglobin: 9.9 g/dL — ABNORMAL LOW (ref 13.0–17.0)
O2 Saturation: 96 %
O2 Saturation: 99 %
Patient temperature: 99.7
Patient temperature: 99.7
Potassium: 5.2 mmol/L — ABNORMAL HIGH (ref 3.5–5.1)
Potassium: 5.6 mmol/L — ABNORMAL HIGH (ref 3.5–5.1)
Sodium: 146 mmol/L — ABNORMAL HIGH (ref 135–145)
Sodium: 146 mmol/L — ABNORMAL HIGH (ref 135–145)
TCO2: 35 mmol/L — ABNORMAL HIGH (ref 22–32)
TCO2: 35 mmol/L — ABNORMAL HIGH (ref 22–32)
pCO2 arterial: 72.4 mmHg (ref 32.0–48.0)
pCO2 arterial: 61.2 mmHg — ABNORMAL HIGH (ref 32.0–48.0)
pH, Arterial: 7.268 — ABNORMAL LOW (ref 7.350–7.450)
pH, Arterial: 7.339 — ABNORMAL LOW (ref 7.350–7.450)
pO2, Arterial: 101 mmHg (ref 83.0–108.0)
pO2, Arterial: 148 mmHg — ABNORMAL HIGH (ref 83.0–108.0)

## 2020-12-30 LAB — GLUCOSE, CAPILLARY
Glucose-Capillary: 131 mg/dL — ABNORMAL HIGH (ref 70–99)
Glucose-Capillary: 138 mg/dL — ABNORMAL HIGH (ref 70–99)
Glucose-Capillary: 128 mg/dL — ABNORMAL HIGH (ref 70–99)
Glucose-Capillary: 155 mg/dL — ABNORMAL HIGH (ref 70–99)
Glucose-Capillary: 109 mg/dL — ABNORMAL HIGH (ref 70–99)
Glucose-Capillary: 119 mg/dL — ABNORMAL HIGH (ref 70–99)

## 2020-12-30 LAB — CBC
HCT: 31 % — ABNORMAL LOW (ref 39.0–52.0)
Hemoglobin: 10 g/dL — ABNORMAL LOW (ref 13.0–17.0)
MCH: 29 pg (ref 26.0–34.0)
MCHC: 32.3 g/dL (ref 30.0–36.0)
MCV: 89.9 fL (ref 80.0–100.0)
Platelets: 404 10*3/uL — ABNORMAL HIGH (ref 150–400)
RBC: 3.45 MIL/uL — ABNORMAL LOW (ref 4.22–5.81)
RDW: 14.9 % (ref 11.5–15.5)
WBC: 23.1 10*3/uL — ABNORMAL HIGH (ref 4.0–10.5)
nRBC: 0 % (ref 0.0–0.2)

## 2020-12-30 LAB — COMPREHENSIVE METABOLIC PANEL
ALT: 315 U/L — ABNORMAL HIGH (ref 0–44)
AST: 221 U/L — ABNORMAL HIGH (ref 15–41)
Albumin: 1.7 g/dL — ABNORMAL LOW (ref 3.5–5.0)
Alkaline Phosphatase: 130 U/L — ABNORMAL HIGH (ref 38–126)
Anion gap: 5 (ref 5–15)
BUN: 27 mg/dL — ABNORMAL HIGH (ref 6–20)
CO2: 27 mmol/L (ref 22–32)
Calcium: 8.1 mg/dL — ABNORMAL LOW (ref 8.9–10.3)
Chloride: 111 mmol/L (ref 98–111)
Creatinine, Ser: 0.69 mg/dL (ref 0.61–1.24)
GFR, Estimated: >60 mL/min (ref >60)
Glucose, Bld: 128 mg/dL — ABNORMAL HIGH (ref 70–99)
Potassium: 4.9 mmol/L (ref 3.5–5.1)
Sodium: 143 mmol/L (ref 135–145)
Total Bilirubin: 1.2 mg/dL (ref 0.3–1.2)
Total Protein: 6.3 g/dL — ABNORMAL LOW (ref 6.5–8.1)

## 2020-12-30 LAB — PHENOBARBITAL LEVEL: Phenobarbital: 19.5 ug/mL (ref 15.0–30.0)

## 2020-12-30 LAB — PROCALCITONIN: Procalcitonin: 0.65 ng/mL

## 2020-12-30 LAB — PHENYTOIN LEVEL, TOTAL: Phenytoin Lvl: <2.5 ug/mL — ABNORMAL LOW (ref 10.0–20.0)

## 2020-12-30 MED ORDER — MAGNESIUM SULFATE 2 GM/50ML IV SOLN
2.0000 g | Freq: Once | INTRAVENOUS | Status: AC
  Administered 2020-12-30: 2 g via INTRAVENOUS
  Filled 2020-12-30: qty 50

## 2020-12-30 MED ORDER — METHYLPREDNISOLONE SODIUM SUCC 125 MG IJ SOLR
80.0000 mg | INTRAMUSCULAR | Status: DC
  Administered 2020-12-31 – 2021-01-01 (×2): 80 mg via INTRAVENOUS
  Filled 2020-12-30 (×2): qty 2

## 2020-12-30 MED ORDER — SODIUM CHLORIDE 0.9 % IV SOLN
1.0000 g | INTRAVENOUS | Status: DC
  Administered 2020-12-30: 1 g via INTRAVENOUS
  Filled 2020-12-30 (×2): qty 10

## 2020-12-30 MED ORDER — METHYLPREDNISOLONE SODIUM SUCC 125 MG IJ SOLR
125.0000 mg | Freq: Once | INTRAMUSCULAR | Status: AC
  Administered 2020-12-30: 125 mg via INTRAVENOUS
  Filled 2020-12-30: qty 2

## 2020-12-30 MED ORDER — ALBUTEROL SULFATE (2.5 MG/3ML) 0.083% IN NEBU
INHALATION_SOLUTION | RESPIRATORY_TRACT | Status: AC
  Administered 2020-12-30: 2.5 mg
  Filled 2020-12-30: qty 6

## 2020-12-30 NOTE — Progress Notes (Signed)
Sister at bedside  Clinical updates provided Discussed plan of care Opened discussion of tracheostomy -- sister will d/w other family and revisit tomorrow    Eliseo Gum MSN, AGACNP-BC [place] 12/30/2020, 5:59 PM

## 2020-12-30 NOTE — Progress Notes (Signed)
Neurology Progress Note  Brief HPI: Luis Hunter is a 45 y.o. with PMHx of Bipolar disorder ([place]), Depression, Schizophrenia ([place]), and Seizures ([place]). He initially presented to [facility] after several witnessed seizures at home. Has a history of substance abuse and medication nonadherence, UDS notably positive for THC and cocaine in addition to the benzodiazepines he was administered for seizure control. Following benzodiazepine administration, he had sonorous respirations and was intubated for airway protection. He was transferred to [facility] for long-term EEG monitoring and continues on this.  Interval History: 8/15 EEG video evidence with left > right upper extremity > lower extremity rhythmic twitching with concomitant EEG sharply contoured 3 to 5 Hz theta-delta slowing which at times appeared rhythmic with triphasic morphology, without definite evolution. Appearance concerning for focal motor seizures.  8/16 EEG with profound diffuse encephalopathy of nonspecific etiology. On Versed and propofol gtt, Keppra 1,500 mg BID, Vimpat 200 mg BID, phenobarbital '65mg'$  BID, and phenytoin 100 mg q8h 8/17 Stopped versed gtt 8/18 - 8/21: Severe diffuse encephalopathy, GPDs with triphasic morphology at 1 Hz which of on ictal-interictal continuum with low potential for seizures 8/22: Stopped sedation, reduced vimpat to 100 mg BID, continues to be comatose 8/23: Aspiration pneumonia with Serratia; continues to be comatose. Subtherapeutic phenytoin, phenytoin increased to 100 mg q6h 8/24: Phenytoin subtherapeutic despite increasing dosages, transitioned from PO to IV. Discontinued phenobarbital to minimize sedation. MRI brain without acute or focal intracranial finding.  Subjective: Patient with respiratory complications overnight requiring increased sedation, concern for asthma exacerbation; On precedex gtt and fentanyl gtt.  Discussed with CCM NP and Neuro MD discussed with CCM MD with concerns for progression  of respiratory complications and the potential for ketamine and/or neuromuscular blockade   Exam: Vitals:   12/30/20 0738 12/30/20 0739  BP:    Pulse: (!) 127   Resp: (!) 27   Temp:    SpO2: 98% 99%   Gen: Critically ill appearing male, intubated and sedated in the ICU Resp: Respirations assisted via mechanical ventilator, ETT secured, tachypnea on cardiac monitor Abd: soft, non-distended  Neuro: Mental Status: Comatose, eyes are open throughout assessment as bedside RN and HCT are bathing patient,  Cranial Nerves:PERRL 2 mm and brisk, midline gaze, corneal reflexes are intact, no cough or gag appreciated. Motor: No spontaneous movement. Does not withdraw to noxious stimuli throughout.  Left foot with great toe minimal movement to noxious stimuli.  Sensory: Does not grimace or withdraw throughout. Plantars: Left toe up going, right mute Gait: Deferred  Pertinent Labs: CBC    Component Value Date/Time   WBC 23.1 (H) 12/30/2020 0047   RBC 3.45 (L) 12/30/2020 0047   HGB 10.5 (L) 12/30/2020 0824   HGB 14.6 03/12/2014 1249   HCT 31.0 (L) 12/30/2020 0824   HCT 44.5 03/12/2014 1249   PLT 404 (H) 12/30/2020 0047   PLT 183 03/12/2014 1249   MCV 89.9 12/30/2020 0047   MCV 90 03/12/2014 1249   MCH 29.0 12/30/2020 0047   MCHC 32.3 12/30/2020 0047   RDW 14.9 12/30/2020 0047   RDW 15.4 (H) 03/12/2014 1249   LYMPHSABS 1.4 12/27/2020 0232   MONOABS 0.9 12/27/2020 0232   EOSABS 0.3 12/27/2020 0232   BASOSABS 0.1 12/27/2020 0232   CMP     Component Value Date/Time   NA 146 (H) 12/30/2020 0824   NA 139 03/12/2014 1249   K 5.2 (H) 12/30/2020 0824   K 3.4 (L) 03/12/2014 1249   CL 111 12/30/2020 0047  CL 106 03/12/2014 1249   CO2 27 12/30/2020 0047   CO2 27 03/12/2014 1249   GLUCOSE 128 (H) 12/30/2020 0047   GLUCOSE 127 (H) 03/12/2014 1249   BUN 27 (H) 12/30/2020 0047   BUN 14 03/12/2014 1249   CREATININE 0.69 12/30/2020 0047   CREATININE 1.09 11/16/2015 1417   CALCIUM 8.1  (L) 12/30/2020 0047   CALCIUM 8.9 03/12/2014 1249   PROT 6.3 (L) 12/30/2020 0047   PROT 7.5 03/12/2014 1249   ALBUMIN 1.7 (L) 12/30/2020 0047   ALBUMIN 4.0 03/12/2014 1249   AST 221 (H) 12/30/2020 0047   AST 27 03/12/2014 1249   ALT 315 (H) 12/30/2020 0047   ALT 30 03/12/2014 1249   ALKPHOS 130 (H) 12/30/2020 0047   ALKPHOS 76 03/12/2014 1249   BILITOT 1.2 12/30/2020 0047   BILITOT 0.6 03/12/2014 1249   GFRNONAA >60 12/30/2020 0047   GFRNONAA >60 03/12/2014 1249   GFRNONAA >60 10/13/2012 1601   GFRAA >60 12/07/2019 1052   GFRAA >60 03/12/2014 1249   GFRAA >60 10/13/2012 1601    Ref. Range 12/22/2020 10:05 12/28/2020 11:55  Amphetamines Latest Ref Range: NONE DETECTED  NONE DETECTED NONE DETECTED  Barbiturates Latest Ref Range: NONE DETECTED  POSITIVE (A) POSITIVE (A)  Benzodiazepines Latest Ref Range: NONE DETECTED  POSITIVE (A) POSITIVE (A)  Opiates Latest Ref Range: NONE DETECTED  NONE DETECTED NONE DETECTED  COCAINE Latest Ref Range: NONE DETECTED  POSITIVE (A) NONE DETECTED  Tetrahydrocannabinol Latest Ref Range: NONE DETECTED  POSITIVE (A) POSITIVE (A)    Lab Results  Component Value Date   TSH 2.776 12/27/2020   Lab Results  Component Value Date   FOLATE 8.0 12/27/2020   Lab Results  Component Value Date   VITAMINB12 884 12/27/2020   Component Ref Range & Units 00:47 1 d ago 2 d ago 3 d ago 4 d ago 5 d ago 6 d ago  Phenobarbital 15.0 - 30.0 ug/mL 19.5  21.5 CM  25.1 CM  25.9 CM  26.4 CM  26.4 CM  23.2 CM    Component Ref Range & Units 00:47 1 d ago 2 d ago 3 d ago 4 d ago 5 d ago 6 d ago  Phenytoin Lvl 10.0 - 20.0 ug/mL <2.5 Low   <2.5 Low  CM  <2.5 Low  CM  <2.5 Low  CM  2.9 Low  CM  5.5 Low  CM  8.1 Low  CM    Thiamine 202.6  Imaging Reviewed: Most recent MRI brain was completed on 12/27/2020 due to his persistent somnolence and did not show any acute process there was a mild increase in flair signal within the CSF attributed to mechanical ventilation and  bilateral mastoid effusions and inflammatory changes of the paranasal sinuses  EEG overnight 12/29/2020 - 12/30/2020: "This study is suggestive of moderate to severe diffuse encephalopathy, nonspecific etiology. No seizures or definite epileptiform discharges were seen during this study.   Two events of sudden hypertension and diaphoresis were noted as described above without concomitant eeg change. These events were NOT epileptic."  Assessment and Plan: 45 year old male with history of epilepsy who presented with status epilepticus in the setting of medication noncompliance.   Status epilepticus (resolved) Epilepsy with breakthrough seizure Acute encephalopathy, postictal and medication induced -Worsening again today though his best exam recently demonstrated minimal interaction. -Most likely multifactorial encephalopathy due to infection, medications, drug use, prolonged postictal state.   -Requiring increased sedation for ventilator management due to likely asthma  exacerbation per CCM  Recommendations -Normal TSH, folate and B12.  Slightly elevated thiamine. Thiamine supplementation discontinued. -Discontinue LTM EEG as he has not had evidence of seizures. Do not expect improvement in neurologic evaluation in the setting of increasing sedation for respiratory status and ventilator compliance.  -Continue Keppra to 1500 mg twice daily, Vimpat 100 mg twice daily, continue phenytoin 100 mg IV every 6 hours for now;  -Continue seizure precautions -As needed IV versed for clinical seizure-like activity -Once patient is stabilized from a respiratory standpoint, weaning sedation again; can repeat spot EEG or LTM EEG if mental status remains poor or clinical concern for seizure activity arises.  -Neurology will be available on an as-needed basis, please consult if new questions arise -Discussed plan with CCM team at bedside  -Management of rest of comorbidities per primary team   Anibal Henderson,  AGACNP-BC [facility] [phone]  Attending Neurologist's note:  I saw this patient, gathering history, reviewing relevant labs, and formulated the assessment and plan, adding to the note above for completeness and clarity to accurately reflect my thoughts.  Discussed with CCM attending Dr. Lynetta Mare via telephone  Lesleigh Noe MD-PhD [facility] [phone] Available 7 AM to 7 PM, outside these hours please contact Neurologist on call listed on [place]

## 2020-12-30 NOTE — Procedures (Addendum)
Patient Name: Luis Hunter  MRN: [id number]  Epilepsy Attending: Lora Havens  Referring Physician/Provider: Dr Lesleigh Noe Duration: 12/29/2020 1201 to 12/30/2020 1201   Patient history: 45 year old with status epilepticus-has a history of bipolar disorder, depression, schizophrenia polysubstance abuse and seizures with noncompliance to medications. EEG to evaluate for seizure   Level of alertness: lethargic   AEDs during EEG study: LEV, PHT, Vimpat   Technical aspects: This EEG study was done with scalp electrodes positioned according to the 10-20 International system of electrode placement. Electrical activity was acquired at a sampling rate of '500Hz'$  and reviewed with a high frequency filter of '70Hz'$  and a low frequency filter of '1Hz'$ . EEG data were recorded continuously and digitally stored.    Description: No clear posterior dominant rhythm was seen.  Sleep was characterized by sleep spindles (12 to 14 Hz), maximum frontocentral region. EEG showed continuous generalized 3-5 Hz theta-delta slowing.  Event button was pressed on 12/29/2020 at 2045.  Per RN patient was noted to have diaphoresiswhich was difficult to visualize on camera.  Concomitant EEG before, during and after the event did not show any EEG changes suggest seizure.  Event button was pressed on 12/30/2020 at 0219.  Per RN patient was noted to have sudden hypertension and disphoresis which was difficult to visualize on camera.  Concomitant EEG before, during and after the event did not show any EEG changes suggest seizure.   ABNORMALITY -Continuous slow, generalized   IMPRESSION: This study is suggestive of moderate to severe diffuse encephalopathy, nonspecific etiology. No seizures or definite epileptiform discharges were seen during this study.  Two events of sudden hypertension and diaphoresis were noted as described above without concomitant eeg change. These events were NOT epileptic.    Luis Hunter Luis Hunter

## 2020-12-30 NOTE — Progress Notes (Addendum)
NAME:  Luis Hunter, MRN:  [id number], DOB:  1975-10-27, LOS: 60 ADMISSION DATE:  12/16/2020, CONSULTATION DATE:  12/30/2020  REFERRING MD:  Starleen Blue, EDP [facility], CHIEF COMPLAINT:  seizures, intubated   History of Present Illness:  45 yo male smoker found unresponsive by his wife.  Noted to have multiple seizures witnessed by his wife.  Intubated for airway protection and loaded with keppra.  UDS positive for cocaine and THC.  Transferred from [facility] to [facility] for continuous EEG monitoring.  Pertinent  Medical History  Schizophrenia, Seizures, Bipolar, Depression, Cocaine abuse  Significant Hospital Events:  8/13 intubated, transferred to [facility] 8/16 respiratory aspirate with gram positive cocci, few gram negative rods 8/16 started on unasyn 8/17 unasyn discontinued 8/18 started on zosyn 8/20 vent dyssynchrony >> add precedex; EEG with periodic triphasic morphology and generalized slowing 8/22 cardiac catheterization for STEMI-no significant coronary artery disease discovered 8/23 remains encephalopathic, required escalation of sedatives for vent dyssynchrony  8/24 MRI-mastoid fullness, no intracerebral abnormality 8/25 started augmentin 8/26 rare serratia trach aspirate, suspect colony since this was treated earlier this admit 8/27 on cEEG   Interim History / Subjective:   Very tachycardic this morning On cEEG On 150 fent 0.9 dexmed   Thick tan secretions    Objective   Blood pressure 112/72, pulse (!) 127, temperature 99.7 F (37.6 C), temperature source Axillary, resp. rate (!) 27, height '5\' 11"'$  (1.803 m), weight 88 kg, SpO2 99 %.    Vent Mode: PRVC FiO2 (%):  [40 %-60 %] 50 % Set Rate:  [20 bmp] 20 bmp Vt Set:  [550 mL] 550 mL PEEP:  [5 cmH20] 5 cmH20 Plateau Pressure:  [21 cmH20-33 cmH20] 33 cmH20   Intake/Output Summary (Last 24 hours) at 12/30/2020 [id number] Last data filed at 12/30/2020 0700 Gross per 24 hour  Intake 3776.12 ml  Output 2250 ml  Net 1526.12 ml   Filed  Weights   12/28/20 0500 12/29/20 0447 12/30/20 0433  Weight: 88.2 kg 88 kg 88 kg    Examination: General: Critically and chronically ill appearing middle aged M intubated sedated  Neuro: Sedated. Pinpoint pupils. No response to pain. Overbreathing vent + corneal reflexes    HEENT: NCAT pink tacky mm anicteric sclera ETT secure  Cardiovascular: tachycardic s1s2 no rgm  Lungs: Minimal air movement. Accessory muscle use, nasal flaring. Tachypneic. Mechanically ventilated.  Abdomen: soft round ndnt +bowel sounds  Musculoskeletal:  no acute joint deformity no cyanosis or clubbing  Skin:  c/d/w    Resolved Hospital Problem list   Hypokalemia,  Hypophosphatemia,  AKI from ATN 2nd to sepsis,  Urinary retention ARDS  Abnormal ECG with ST changes -- felt due to severe LVH (cath clean)  Assessment & Plan:    Acute hypoxic / hypercarbic respiratory failure -bedside US 8/26 assuring against R heart strain, dvt. Suspected asthma exacerbation (8/27)  Suspected Serratia Colonization (tx earlier this admit, trach aspirate 8/26 with rare serratia). That said, white count rising, PCT a bit elevated but downtrending  Sinusitis  P -7d course augmentin for sinusitis -not adding coverage for serratia, think colonized (dw CCM MD) -ABG STAT  -2g mag -incr fent gtt, bolus  -albuterol neb, ipatropium  -IV steroids  -if no progress, Ketamine -if no progress after ket, NMB  -pulm hygiene  -has been intubated for almost 2 weeks, is on minimal vent settings but we arent't close to extubation from mentation standpoint. Need to discuss trach. Recent attempts to reach listed contact not successful  Acute metabolic encephalopathy multifactorial, postictal, medication related SE, improved   Epilepsy with breakthrough seizures P -cEEG -AEDs per neuro   Polysubstance abuse Depression Bipolar disorder Schizophrenia  -gets monthly abilify injection  P -when appropriate, cessation counseling    Elevated LFTs P -trend CMP    Best Practice (right click and "Reselect all SmartList Selections" daily)  Diet/type: tubefeeds DVT prophylaxis: lovenox GI prophylaxis: protonix Code Status:  full code Last date of multidisciplinary goals of care discussion multiple attempts to reach [employer] without recent success   Labs    CMP Latest Ref Rng & Units 12/30/2020 12/30/2020 12/29/2020  Glucose 70 - 99 mg/dL - 128(H) 149(H)  BUN 6 - 20 mg/dL - 27(H) 30(H)  Creatinine 0.61 - 1.24 mg/dL - 0.69 0.77  Sodium 135 - 145 mmol/L 146(H) 143 143  Potassium 3.5 - 5.1 mmol/L 5.2(H) 4.9 4.6  Chloride 98 - 111 mmol/L - 111 111  CO2 22 - 32 mmol/L - 27 26  Calcium 8.9 - 10.3 mg/dL - 8.1(L) 8.0(L)  Total Protein 6.5 - 8.1 g/dL - 6.3(L) 5.9(L)  Total Bilirubin 0.3 - 1.2 mg/dL - 1.2 1.2  Alkaline Phos 38 - 126 U/L - 130(H) 105  AST 15 - 41 U/L - 221(H) 301(H)  ALT 0 - 44 U/L - 315(H) 311(H)    CBC Latest Ref Rng & Units 12/30/2020 12/30/2020 12/28/2020  WBC 4.0 - 10.5 K/uL - 23.1(H) -  Hemoglobin 13.0 - 17.0 g/dL 10.5(L) 10.0(L) 10.9(L)  Hematocrit 39.0 - 52.0 % 31.0(L) 31.0(L) 32.0(L)  Platelets 150 - 400 K/uL - 404(H) -    ABG    Component Value Date/Time   PHART 7.268 (L) 12/30/2020 0824   PCO2ART 72.4 (HH) 12/30/2020 0824   PO2ART 101 12/30/2020 0824   HCO3 32.9 (H) 12/30/2020 0824   TCO2 35 (H) 12/30/2020 0824   ACIDBASEDEF 1.0 12/21/2020 1101   O2SAT 96.0 12/30/2020 0824    CBG (last 3)  Recent Labs    12/29/20 2315 12/30/20 0312 12/30/20 0718  GLUCAP 130* 131* 138*   CRITICAL CARE Performed by: Cristal Generous   Total critical care time: 52 minutes  Critical care time was exclusive of separately billable procedures and treating other patients. Critical care was necessary to treat or prevent imminent or life-threatening deterioration.  Critical care was time spent personally by me on the following activities: development of treatment plan with patient and/or  surrogate as well as nursing, discussions with consultants, evaluation of patient's response to treatment, examination of patient, obtaining history from patient or surrogate, ordering and performing treatments and interventions, ordering and review of laboratory studies, ordering and review of radiographic studies, pulse oximetry and re-evaluation of patient's condition.  Eliseo Gum MSN, AGACNP-BC [place] for pager  12/30/2020, 9:21 AM

## 2020-12-30 NOTE — Progress Notes (Signed)
LTM EEG discontinued - no skin breakdown at unhook.   

## 2020-12-31 ENCOUNTER — Inpatient Hospital Stay (HOSPITAL_COMMUNITY): Payer: Medicare HMO

## 2020-12-31 DIAGNOSIS — J45901 Unspecified asthma with (acute) exacerbation: Secondary | ICD-10-CM

## 2020-12-31 DIAGNOSIS — E7220 Disorder of urea cycle metabolism, unspecified: Secondary | ICD-10-CM

## 2020-12-31 DIAGNOSIS — E875 Hyperkalemia: Secondary | ICD-10-CM | POA: Diagnosis not present

## 2020-12-31 DIAGNOSIS — J9601 Acute respiratory failure with hypoxia: Secondary | ICD-10-CM | POA: Diagnosis not present

## 2020-12-31 DIAGNOSIS — G9340 Encephalopathy, unspecified: Secondary | ICD-10-CM | POA: Diagnosis not present

## 2020-12-31 LAB — CBC
HCT: 26.6 % — ABNORMAL LOW (ref 39.0–52.0)
Hemoglobin: 8.5 g/dL — ABNORMAL LOW (ref 13.0–17.0)
MCH: 29.2 pg (ref 26.0–34.0)
MCHC: 32 g/dL (ref 30.0–36.0)
MCV: 91.4 fL (ref 80.0–100.0)
Platelets: 387 10*3/uL (ref 150–400)
RBC: 2.91 MIL/uL — ABNORMAL LOW (ref 4.22–5.81)
RDW: 15.2 % (ref 11.5–15.5)
WBC: 16.4 10*3/uL — ABNORMAL HIGH (ref 4.0–10.5)
nRBC: 0 % (ref 0.0–0.2)

## 2020-12-31 LAB — GLUCOSE, CAPILLARY
Glucose-Capillary: 112 mg/dL — ABNORMAL HIGH (ref 70–99)
Glucose-Capillary: 122 mg/dL — ABNORMAL HIGH (ref 70–99)
Glucose-Capillary: 169 mg/dL — ABNORMAL HIGH (ref 70–99)
Glucose-Capillary: 135 mg/dL — ABNORMAL HIGH (ref 70–99)
Glucose-Capillary: 110 mg/dL — ABNORMAL HIGH (ref 70–99)

## 2020-12-31 LAB — POCT I-STAT 7, (LYTES, BLD GAS, ICA,H+H)
Acid-Base Excess: 7 mmol/L — ABNORMAL HIGH (ref 0.0–2.0)
Bicarbonate: 33.8 mmol/L — ABNORMAL HIGH (ref 20.0–28.0)
Calcium, Ion: 1.2 mmol/L (ref 1.15–1.40)
HCT: 27 % — ABNORMAL LOW (ref 39.0–52.0)
Hemoglobin: 9.2 g/dL — ABNORMAL LOW (ref 13.0–17.0)
O2 Saturation: 97 %
Patient temperature: 98.9
Potassium: 4.4 mmol/L (ref 3.5–5.1)
Sodium: 146 mmol/L — ABNORMAL HIGH (ref 135–145)
TCO2: 36 mmol/L — ABNORMAL HIGH (ref 22–32)
pCO2 arterial: 59.2 mmHg — ABNORMAL HIGH (ref 32.0–48.0)
pH, Arterial: 7.366 (ref 7.350–7.450)
pO2, Arterial: 100 mmHg (ref 83.0–108.0)

## 2020-12-31 LAB — COMPREHENSIVE METABOLIC PANEL
ALT: 340 U/L — ABNORMAL HIGH (ref 0–44)
AST: 285 U/L — ABNORMAL HIGH (ref 15–41)
Albumin: 1.5 g/dL — ABNORMAL LOW (ref 3.5–5.0)
Alkaline Phosphatase: 130 U/L — ABNORMAL HIGH (ref 38–126)
Anion gap: 5 (ref 5–15)
BUN: 34 mg/dL — ABNORMAL HIGH (ref 6–20)
CO2: 30 mmol/L (ref 22–32)
Calcium: 8.1 mg/dL — ABNORMAL LOW (ref 8.9–10.3)
Chloride: 110 mmol/L (ref 98–111)
Creatinine, Ser: 0.8 mg/dL (ref 0.61–1.24)
GFR, Estimated: >60 mL/min (ref >60)
Glucose, Bld: 122 mg/dL — ABNORMAL HIGH (ref 70–99)
Potassium: 5.2 mmol/L — ABNORMAL HIGH (ref 3.5–5.1)
Sodium: 145 mmol/L (ref 135–145)
Total Bilirubin: 1.3 mg/dL — ABNORMAL HIGH (ref 0.3–1.2)
Total Protein: 5.9 g/dL — ABNORMAL LOW (ref 6.5–8.1)

## 2020-12-31 LAB — PHENOBARBITAL LEVEL: Phenobarbital: 14.3 ug/mL — ABNORMAL LOW (ref 15.0–30.0)

## 2020-12-31 LAB — AMMONIA: Ammonia: 63 umol/L — ABNORMAL HIGH (ref 9–35)

## 2020-12-31 LAB — MAGNESIUM: Magnesium: 2.3 mg/dL (ref 1.7–2.4)

## 2020-12-31 IMAGING — DX DG CHEST 1V
1 series · 1 of 1 positions shown · non-contrast
Comparison: Chest radiograph dated [DATE].

CLINICAL DATA: Respiratory failure.

EXAM:
CHEST  1 VIEW

[chest ap]
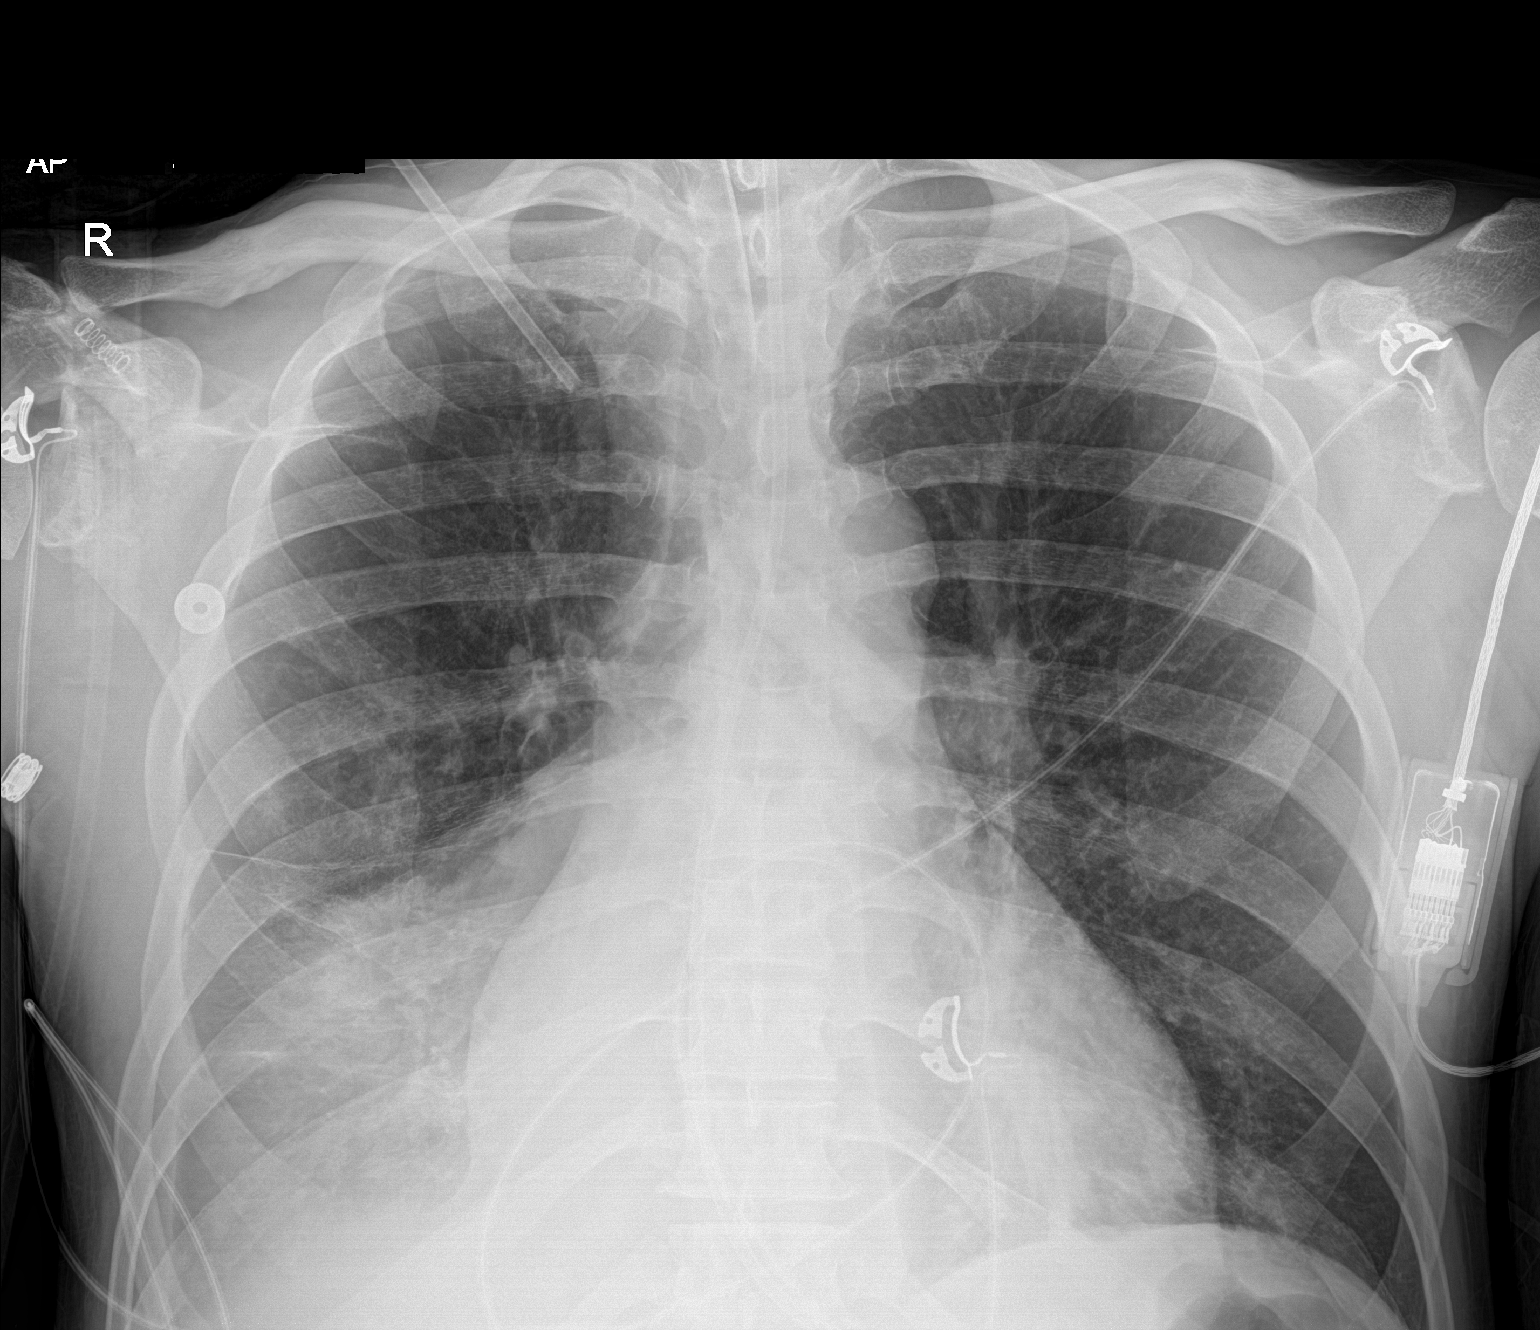

[1 of 1 positions shown; findings below may reference images not displayed]

FINDINGS: Endotracheal tube above the carina in similar position. Feeding tube
extends below the diaphragm with tip beyond the inferior margin of
the image. Interval progression of right lung base opacity and
development of a small right pleural effusion. Similar appearance of
left lung base atelectasis or infiltrate. No pneumothorax. Stable
cardiac silhouette. No acute osseous pathology.
IMPRESSION: Interval progression of right lung base opacity and development of a
small right pleural effusion. Similar appearance of left lung base
atelectasis or infiltrate.

## 2020-12-31 MED ORDER — LACTULOSE 10 GM/15ML PO SOLN
10.0000 g | Freq: Three times a day (TID) | ORAL | Status: DC
  Administered 2020-12-31 – 2021-01-02 (×7): 10 g
  Filled 2020-12-31 (×7): qty 15

## 2020-12-31 MED ORDER — PROPOFOL 10 MG/ML IV BOLUS
500.0000 mg | Freq: Once | INTRAVENOUS | Status: DC
  Filled 2020-12-31: qty 60

## 2020-12-31 MED ORDER — LACTATED RINGERS IV SOLN: INTRAVENOUS | Status: DC

## 2020-12-31 MED ORDER — POLYETHYLENE GLYCOL 3350 17 G PO PACK
17.0000 g | PACK | Freq: Two times a day (BID) | ORAL | Status: DC
  Administered 2020-12-31 – 2021-01-02 (×5): 17 g
  Filled 2020-12-31 (×5): qty 1

## 2020-12-31 MED ORDER — IPRATROPIUM-ALBUTEROL 0.5-2.5 (3) MG/3ML IN SOLN
3.0000 mL | Freq: Four times a day (QID) | RESPIRATORY_TRACT | Status: DC
  Administered 2020-12-31 – 2021-01-03 (×12): 3 mL via RESPIRATORY_TRACT
  Filled 2020-12-31 (×10): qty 3
  Filled 2020-12-31: qty 39
  Filled 2020-12-31: qty 3

## 2020-12-31 MED ORDER — FENTANYL CITRATE (PF) 100 MCG/2ML IJ SOLN: 200.0000 ug | Freq: Once | INTRAMUSCULAR | Status: DC

## 2020-12-31 MED ORDER — MIDAZOLAM HCL 2 MG/2ML IJ SOLN
5.0000 mg | Freq: Once | INTRAMUSCULAR | Status: AC
  Administered 2021-01-01: 4 mg via INTRAVENOUS
  Filled 2020-12-31: qty 6

## 2020-12-31 MED ORDER — ETOMIDATE 2 MG/ML IV SOLN
40.0000 mg | Freq: Once | INTRAVENOUS | Status: AC
  Administered 2021-01-01: 40 mg via INTRAVENOUS
  Filled 2020-12-31: qty 20

## 2020-12-31 MED ORDER — SODIUM CHLORIDE 0.9 % IV SOLN
1.0000 g | Freq: Three times a day (TID) | INTRAVENOUS | Status: AC
  Administered 2020-12-31 – 2021-01-06 (×20): 1 g via INTRAVENOUS
  Filled 2020-12-31 (×20): qty 1

## 2020-12-31 MED ORDER — FREE WATER
100.0000 mL | Freq: Three times a day (TID) | Status: DC
  Administered 2020-12-31 – 2021-01-09 (×26): 100 mL

## 2020-12-31 MED ORDER — VECURONIUM BROMIDE 10 MG IV SOLR
10.0000 mg | Freq: Once | INTRAVENOUS | Status: AC
  Administered 2021-01-01: 10 mg via INTRAVENOUS
  Filled 2020-12-31: qty 10

## 2020-12-31 MED ORDER — SENNOSIDES 8.8 MG/5ML PO SYRP
5.0000 mL | ORAL_SOLUTION | Freq: Two times a day (BID) | ORAL | Status: DC
  Administered 2020-12-31 – 2021-01-04 (×8): 5 mL
  Filled 2020-12-31 (×8): qty 5

## 2020-12-31 NOTE — Progress Notes (Addendum)
eLink Physician-Brief Progress Note Patient Name: Luis Hunter DOB: 1975-10-12 MRN: [id number]   Date of Service  12/31/2020  HPI/Events of Note  Pt hr in the 120's, had desated earlier, suctioned for large amount of secretions. RRT increased fio2 from 40 to 60%.  Pt had issues on dayshift similar.  RRT did indicate that he had suctioned large amount of secretions and that his lung sounds were worse.  Do you want to get a chest xray?    Nurse concerned with HR, does not have anything prn for hr control  Camera eval: St 120. 95% in synchrony with vent. 150/100 RR increased. Eyes- no up rolling.  AHRF. Leukocytosis on steroids Suspected asthma exacerbation.  8/26 rare serratia trach aspirate, suspect colony since this was treated earlier this admit Epilepsy.   eICU Interventions  - Versed prn to give once - has scheduled metoprolol, to give it now - CxR and ABG ordered.      Intervention Category Intermediate Interventions: Arrhythmia - evaluation and management;Respiratory distress - evaluation and management  Elmer Sow 12/31/2020, 9:31 PM  01:02 CxR and Abg reviewed. Stable.  Continue care

## 2020-12-31 NOTE — Procedures (Signed)
Patient Name: BRADLYN UNDERWOOD  MRN: [id number]  Epilepsy Attending: Lora Havens  Referring Physician/Provider: Dr Lesleigh Noe Duration: 12/30/2020 1201 to 12/30/2020 1459   Patient history: 45 year old with status epilepticus-has a history of bipolar disorder, depression, schizophrenia polysubstance abuse and seizures with noncompliance to medications. EEG to evaluate for seizure   Level of alertness: lethargic   AEDs during EEG study: LEV, PHT, Vimpat   Technical aspects: This EEG study was done with scalp electrodes positioned according to the 10-20 International system of electrode placement. Electrical activity was acquired at a sampling rate of '500Hz'$  and reviewed with a high frequency filter of '70Hz'$  and a low frequency filter of '1Hz'$ . EEG data were recorded continuously and digitally stored.    Description: No clear posterior dominant rhythm was seen.  Sleep was characterized by sleep spindles (12 to 14 Hz), maximum frontocentral region. EEG showed continuous generalized 3-5 Hz theta-delta slowing.    ABNORMALITY -Continuous slow, generalized   IMPRESSION: This study is suggestive of moderate to severe diffuse encephalopathy, nonspecific etiology. No seizures or definite epileptiform discharges were seen during this study.     Jody Silas Barbra Sarks

## 2020-12-31 NOTE — Progress Notes (Signed)
Due to pt's increase in HR, RT will hold CPT until next schedule time.

## 2020-12-31 NOTE — Progress Notes (Signed)
NAME:  Luis Hunter, MRN:  [id number], DOB:  06/03/1975, LOS: 41 ADMISSION DATE:  12/16/2020, CONSULTATION DATE:  12/31/2020  REFERRING MD:  Starleen Blue, EDP [facility], CHIEF COMPLAINT:  seizures, intubated   History of Present Illness:  44 yo male smoker found unresponsive by his wife.  Noted to have multiple seizures witnessed by his wife.  Intubated for airway protection and loaded with keppra.  UDS positive for cocaine and THC.  Transferred from [facility] to [facility] for continuous EEG monitoring.  Pertinent  Medical History  Schizophrenia, Seizures, Bipolar, Depression, Cocaine abuse  Significant Hospital Events:  8/13 intubated, transferred to [facility] 8/16 respiratory aspirate with gram positive cocci, few gram negative rods 8/16 started on unasyn 8/17 unasyn discontinued 8/18 started on zosyn 8/20 vent dyssynchrony >> add precedex; EEG with periodic triphasic morphology and generalized slowing 8/22 cardiac catheterization for STEMI-no significant coronary artery disease discovered 8/23 remains encephalopathic, required escalation of sedatives for vent dyssynchrony  8/24 MRI-mastoid fullness, no intracerebral abnormality 8/25 started augmentin 8/26 rare serratia trach aspirate, suspect colony since this was treated earlier this admit 8/27 on cEEG   Interim History / Subjective:   Improved vent mechanics   Did spike a temp overnight -- 101.4 but WBC is down to 16 from 23    Objective   Blood pressure (!) 180/100, pulse (!) 134, temperature 99.7 F (37.6 C), temperature source Oral, resp. rate (!) 26, height '5\' 11"'$  (1.803 m), weight 81.5 kg, SpO2 92 %.    Vent Mode: PRVC FiO2 (%):  [40 %-50 %] 40 % Set Rate:  [12 bmp] 12 bmp Vt Set:  [600 mL] 600 mL PEEP:  [8 cmH20] 8 cmH20 Pressure Support:  [8 cmH20] 8 cmH20 Plateau Pressure:  [14 cmH20-20 cmH20] 17 cmH20   Intake/Output Summary (Last 24 hours) at 12/31/2020 0944 Last data filed at 12/31/2020 0900 Gross per 24 hour  Intake 4251.98 ml   Output 2390 ml  Net 1861.98 ml   Filed Weights   12/29/20 0447 12/30/20 0433 12/31/20 0339  Weight: 88 kg 88 kg 81.5 kg    Examination: General: Critically and chronically ill appearing middle aged M, appears older than stated age, intubated sedated NAD  Neuro: Sedated. Opened eyes to verbal stimulation. Did not follow commands. Did not localize, withdraw or grimace to painful stimuli  HEENT: NCAT  pink mmm ETT secure tan secretions  Cardiovascular: rr s1s2 no rgm  Lungs: Upper lobe rhonchi. Faint wheeze Symmetrical chest expansion  Abdomen: Soft round ndnt  Musculoskeletal:  no acute deformity no cyanosis or clubbing  Skin:  c/d/w    Resolved Hospital Problem list   Hypokalemia,  Hypophosphatemia,  AKI from ATN 2nd to sepsis,  Urinary retention ARDS  Abnormal ECG with ST changes -- felt due to severe LVH (cath clean)  Assessment & Plan:   Acute metabolic encephalopathy, multifactorial  -medications, post-ictal, ?substance withdrawal, ? Hepatic component (had mild hyperammonemia but no follow up)  -EEGs have been without evidence of sz for about 7days, CT H without acute abnormality   P  -wean meds when resp status improves  -could consider an ammonia if there   Acute hypoxic and hypercarbic respiratory failure Suspected asthma exacerbation Serratia Pna vs colonization Sinusitis  P -7d course augmentin for sinusitis -mero starting 8/28 for serratia (previously felt to be colonization, but with new temp, treating now) -keep solumedrol for now, cont Bds  -Family discussing trach -- from medical standpoint this is appropriate path forward, from mentation standpoint  we are not close to extubation  -VAP, pulm hygiene   Hyperkalemia, mild  -looks dry.  P -trend BMP -adding a mIVF and incr FWF   Leukocytosis - improving -wouldn't be surprised if we see a bump with solumedrol in coming days P -abx as above -trend WBC, fever curve   SE - improved Epilepsy with  breakthrough sz P -neuro PRN  -cont AEDs  Polysub abuse Depression Schizophrenia Bipolar disorder  -gets monthly abilify injection  P -when appropriate, cessation counseling   Elevated LFTs   Mild hyperammonemia  P -trend CMP  -PRN coags -check ammonia 8/28  -going to start lactulose 8/28 based on 8/24's ammonia  51   At risk for constipation LBM 8/25 P -Incr bowel reg given sedation req for vent needs   Inadequate PO intake -EN per RDN   Best Practice (right click and "Reselect all SmartList Selections" daily)  Diet/type: tubefeeds DVT prophylaxis: lovenox GI prophylaxis: protonix Code Status:  full code Last date of multidisciplinary goals of care discussion: 8/27   Labs    CMP Latest Ref Rng & Units 12/31/2020 12/30/2020 12/30/2020  Glucose 70 - 99 mg/dL 122(H) - -  BUN 6 - 20 mg/dL 34(H) - -  Creatinine 0.61 - 1.24 mg/dL 0.80 - -  Sodium 135 - 145 mmol/L 145 146(H) 146(H)  Potassium 3.5 - 5.1 mmol/L 5.2(H) 5.6(H) 5.2(H)  Chloride 98 - 111 mmol/L 110 - -  CO2 22 - 32 mmol/L 30 - -  Calcium 8.9 - 10.3 mg/dL 8.1(L) - -  Total Protein 6.5 - 8.1 g/dL 5.9(L) - -  Total Bilirubin 0.3 - 1.2 mg/dL 1.3(H) - -  Alkaline Phos 38 - 126 U/L 130(H) - -  AST 15 - 41 U/L 285(H) - -  ALT 0 - 44 U/L 340(H) - -    CBC Latest Ref Rng & Units 12/31/2020 12/30/2020 12/30/2020  WBC 4.0 - 10.5 K/uL 16.4(H) - -  Hemoglobin 13.0 - 17.0 g/dL 8.5(L) 9.9(L) 10.5(L)  Hematocrit 39.0 - 52.0 % 26.6(L) 29.0(L) 31.0(L)  Platelets 150 - 400 K/uL 387 - -    ABG    Component Value Date/Time   PHART 7.339 (L) 12/30/2020 1512   PCO2ART 61.2 (H) 12/30/2020 1512   PO2ART 148 (H) 12/30/2020 1512   HCO3 32.7 (H) 12/30/2020 1512   TCO2 35 (H) 12/30/2020 1512   ACIDBASEDEF 1.0 12/21/2020 1101   O2SAT 99.0 12/30/2020 1512    CBG (last 3)  Recent Labs    12/30/20 2307 12/31/20 0307 12/31/20 0721  GLUCAP 119* 112* 122*   CRITICAL CARE Performed by: Cristal Generous   Total critical  care time: 43 minutes  Critical care time was exclusive of separately billable procedures and treating other patients. Critical care was necessary to treat or prevent imminent or life-threatening deterioration.  Critical care was time spent personally by me on the following activities: development of treatment plan with patient and/or surrogate as well as nursing, discussions with consultants, evaluation of patient's response to treatment, examination of patient, obtaining history from patient or surrogate, ordering and performing treatments and interventions, ordering and review of laboratory studies, ordering and review of radiographic studies, pulse oximetry and re-evaluation of patient's condition.

## 2020-12-31 NOTE — Progress Notes (Addendum)
PCCM family communication note  I met with pts sister and father at bedside Discussed clinical updates and tracheostomy  Family would like to move forward with tracheostomy placement.   Pre-trach order set placed, with intent for bedside trach by PCCM 01/01/21 approx 1400.  #6 shiley, cuffed   NPO at midnight    Eliseo Gum MSN, AGACNP-BC [place] for pager  12/31/2020, 4:33 PM

## 2021-01-01 ENCOUNTER — Inpatient Hospital Stay (HOSPITAL_COMMUNITY): Payer: Medicare HMO

## 2021-01-01 DIAGNOSIS — J9602 Acute respiratory failure with hypercapnia: Secondary | ICD-10-CM | POA: Diagnosis not present

## 2021-01-01 DIAGNOSIS — R569 Unspecified convulsions: Secondary | ICD-10-CM

## 2021-01-01 DIAGNOSIS — J9601 Acute respiratory failure with hypoxia: Secondary | ICD-10-CM | POA: Diagnosis not present

## 2021-01-01 LAB — ABO/RH: ABO/RH(D): O POS

## 2021-01-01 LAB — GLUCOSE, CAPILLARY
Glucose-Capillary: 106 mg/dL — ABNORMAL HIGH (ref 70–99)
Glucose-Capillary: 110 mg/dL — ABNORMAL HIGH (ref 70–99)
Glucose-Capillary: 118 mg/dL — ABNORMAL HIGH (ref 70–99)
Glucose-Capillary: 120 mg/dL — ABNORMAL HIGH (ref 70–99)
Glucose-Capillary: 91 mg/dL (ref 70–99)
Glucose-Capillary: 96 mg/dL (ref 70–99)

## 2021-01-01 LAB — BASIC METABOLIC PANEL
Anion gap: 6 (ref 5–15)
BUN: 30 mg/dL — ABNORMAL HIGH (ref 6–20)
CO2: 31 mmol/L (ref 22–32)
Calcium: 8.2 mg/dL — ABNORMAL LOW (ref 8.9–10.3)
Chloride: 107 mmol/L (ref 98–111)
Creatinine, Ser: 0.64 mg/dL (ref 0.61–1.24)
GFR, Estimated: >60 mL/min (ref >60)
Glucose, Bld: 112 mg/dL — ABNORMAL HIGH (ref 70–99)
Potassium: 4.4 mmol/L (ref 3.5–5.1)
Sodium: 144 mmol/L (ref 135–145)

## 2021-01-01 LAB — PHENOBARBITAL LEVEL: Phenobarbital: 13.3 ug/mL — ABNORMAL LOW (ref 15.0–30.0)

## 2021-01-01 LAB — AMMONIA: Ammonia: 36 umol/L — ABNORMAL HIGH (ref 9–35)

## 2021-01-01 LAB — TYPE AND SCREEN
ABO/RH(D): O POS
Antibody Screen: NEGATIVE

## 2021-01-01 LAB — PHENYTOIN LEVEL, TOTAL: Phenytoin Lvl: <2.5 ug/mL — ABNORMAL LOW (ref 10.0–20.0)

## 2021-01-01 IMAGING — DX DG CHEST 1V PORT
2 series · 2 of 2 positions shown · non-contrast
Comparison: Radiographs [DATE] and [DATE].

CLINICAL DATA: Status post tracheostomy.

EXAM:
PORTABLE CHEST 1 VIEW

[chest ap (1 of 2)]
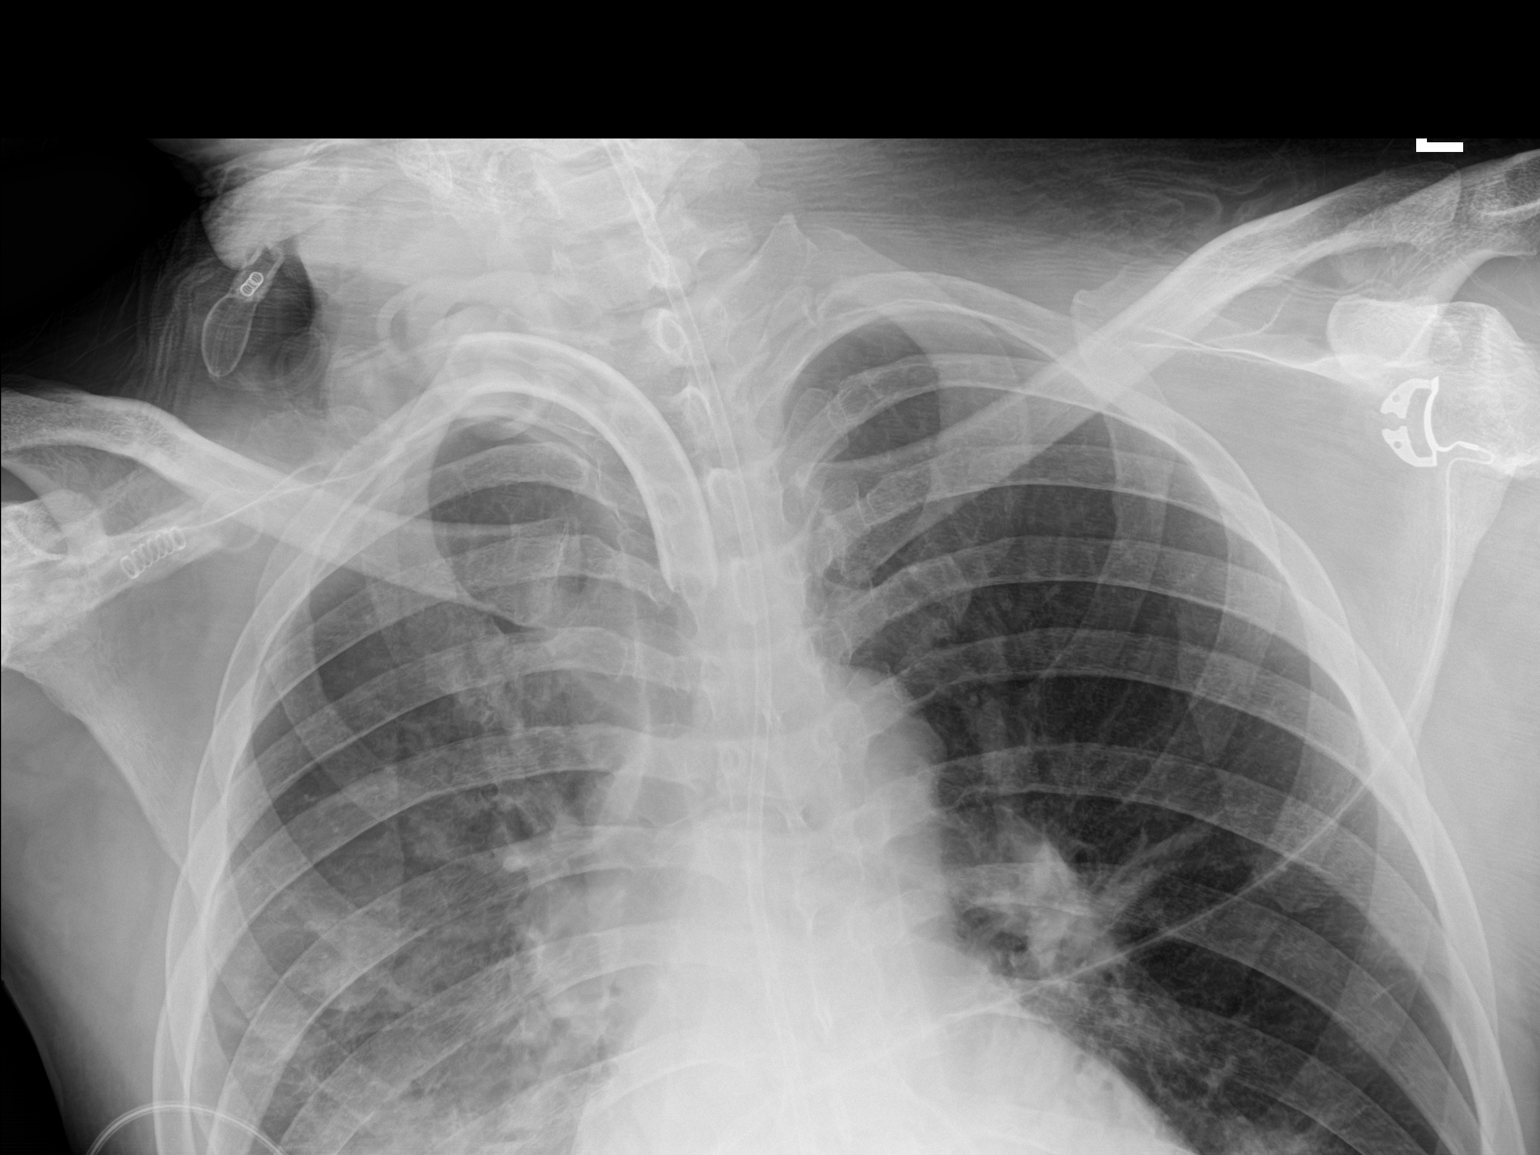

[chest ap (2 of 2)]
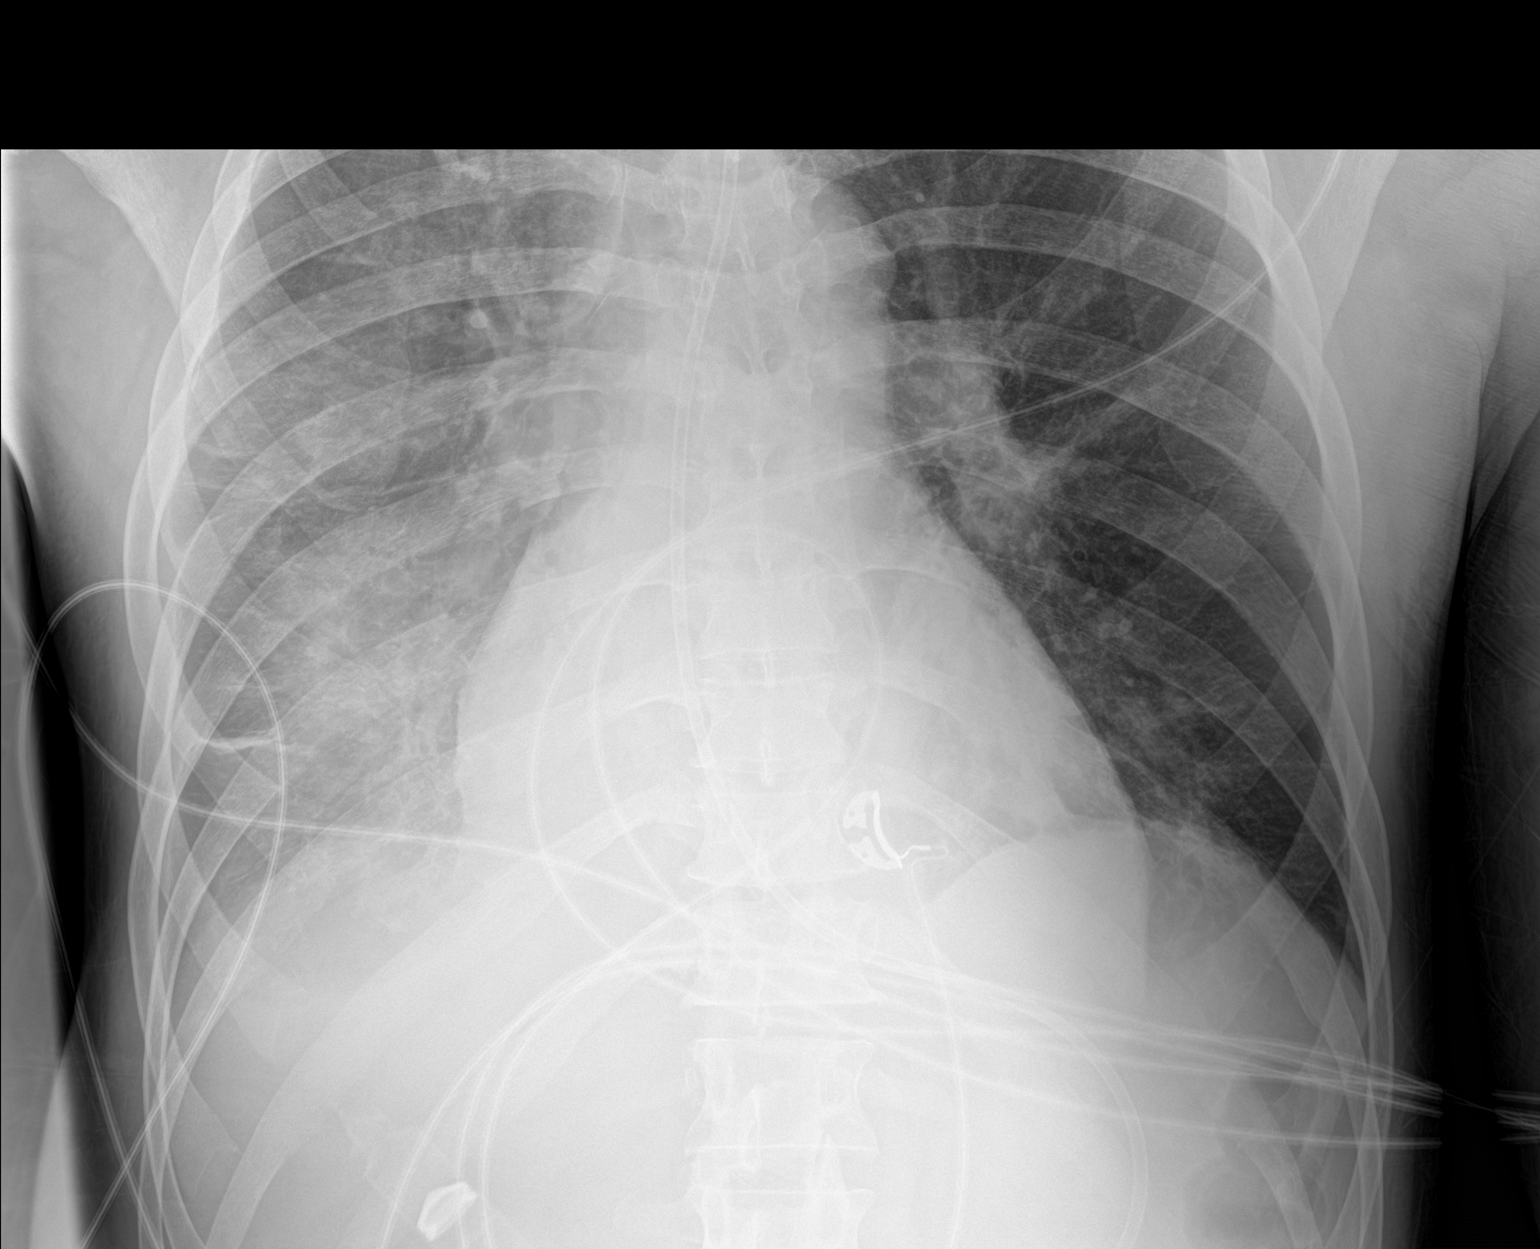

[2 of 2 positions shown; findings below may reference images not displayed]

FINDINGS: [ST] hours. Two views submitted. Interval tracheostomy. The
tracheostomy tube appears well positioned. Feeding tube projects
below the diaphragm, tip not visualized. A previously noted right IJ
sheath is no longer seen.

The heart size and mediastinal contours are stable. Small right
pleural effusion and right-greater-than-left basilar airspace
opacities are grossly unchanged. There is no pneumothorax.
IMPRESSION: 1. Satisfactory position of the tracheostomy tube.  No pneumothorax.
2. No significant change in right-greater-than-left basilar airspace
opacities and small right pleural effusion.

## 2021-01-01 IMAGING — US US ABDOMEN LIMITED
1 series · 13 of 25 positions shown · non-contrast
Comparison: None.

CLINICAL DATA: Abnormal liver function tests.

EXAM:
ULTRASOUND ABDOMEN LIMITED RIGHT UPPER QUADRANT

[Series 1: us abdomen limited ruq (liver/gb) · 13 of 61 slices shown]
[im 1/61]
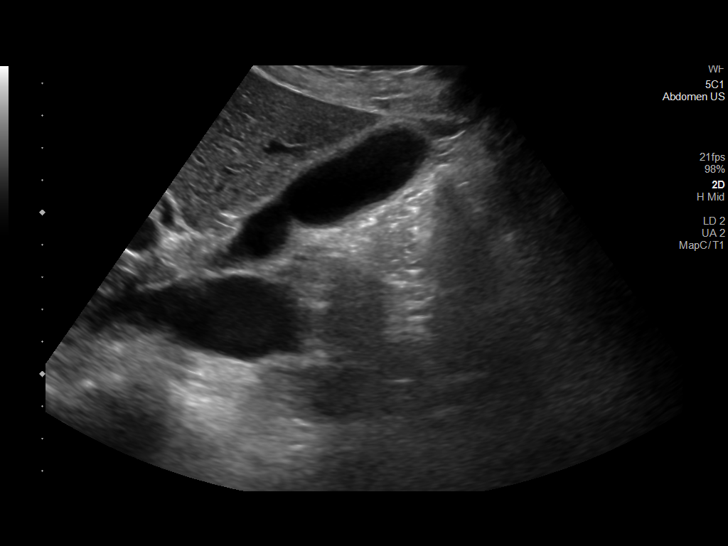
[im 6/61]
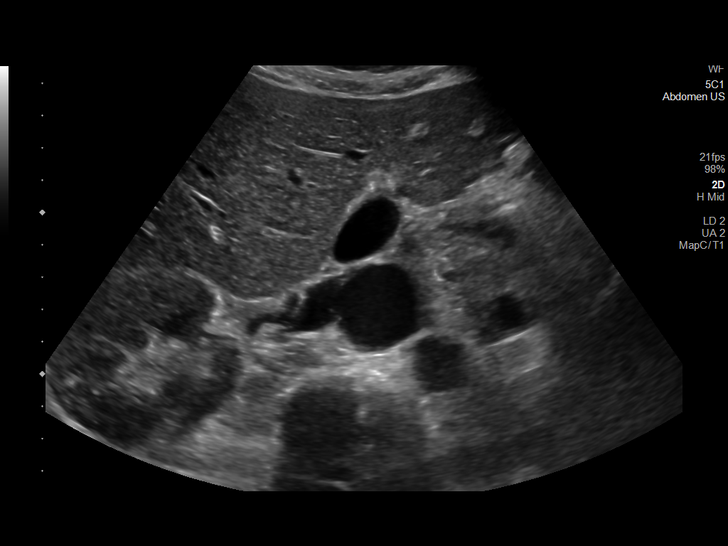
[im 11/61]
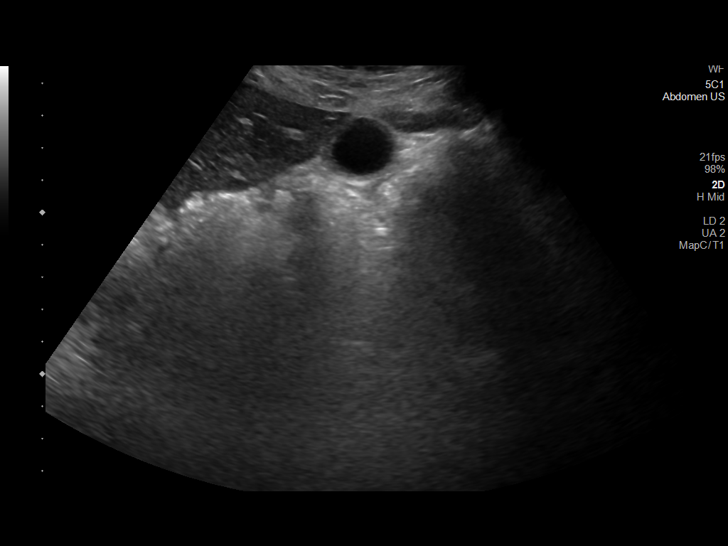
[im 16/61]
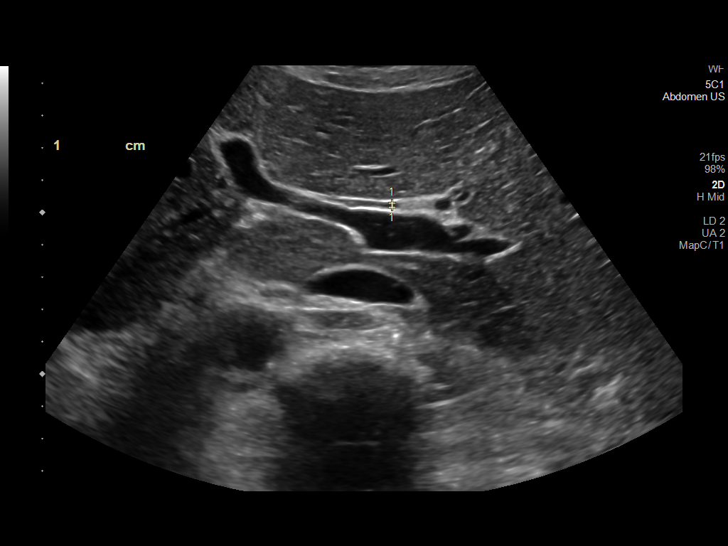
[im 21/61]
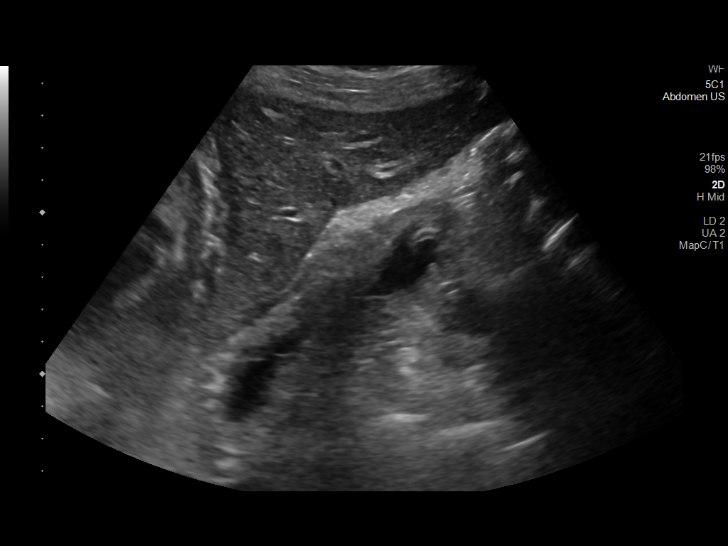
[im 26/61]
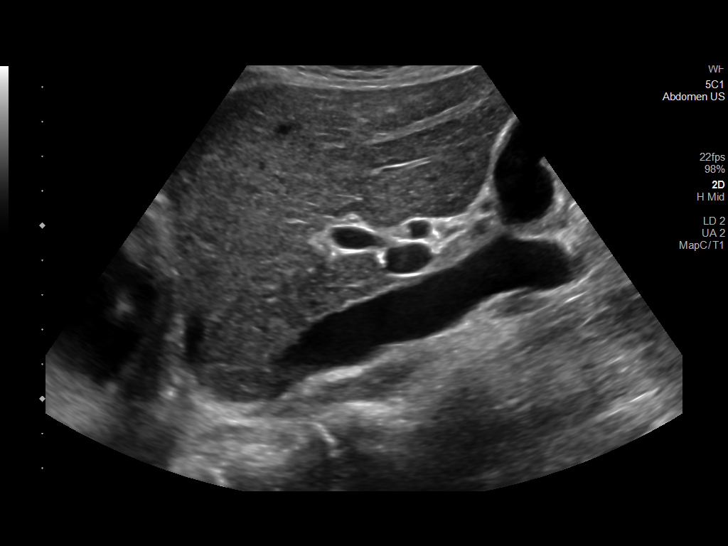
[im 31/61]
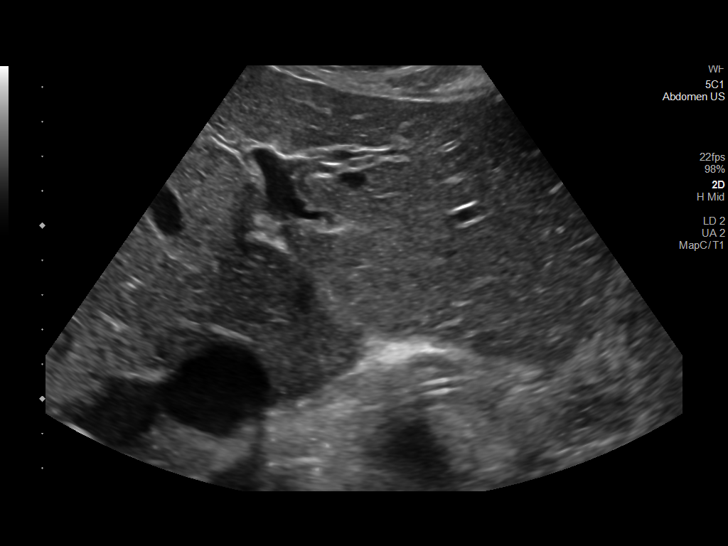
[im 36/61]
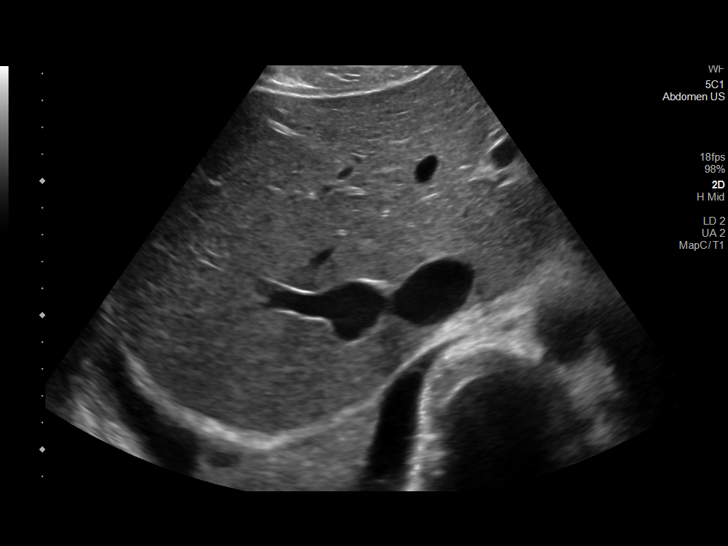
[im 41/61]
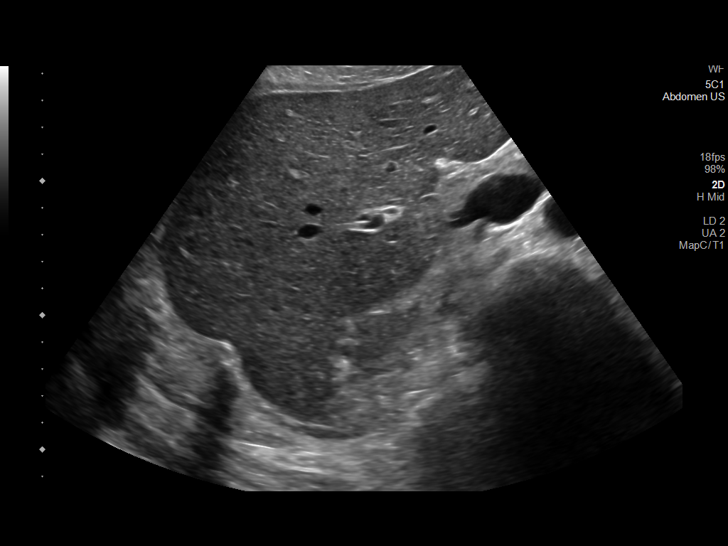
[im 46/61]
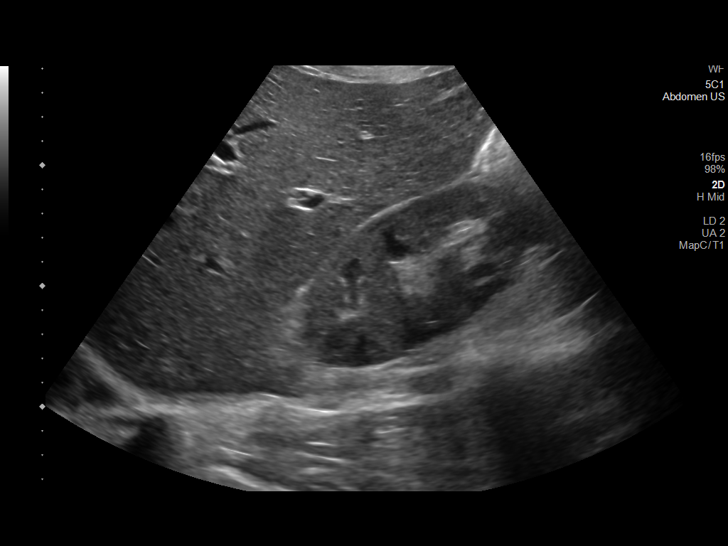
[im 51/61]
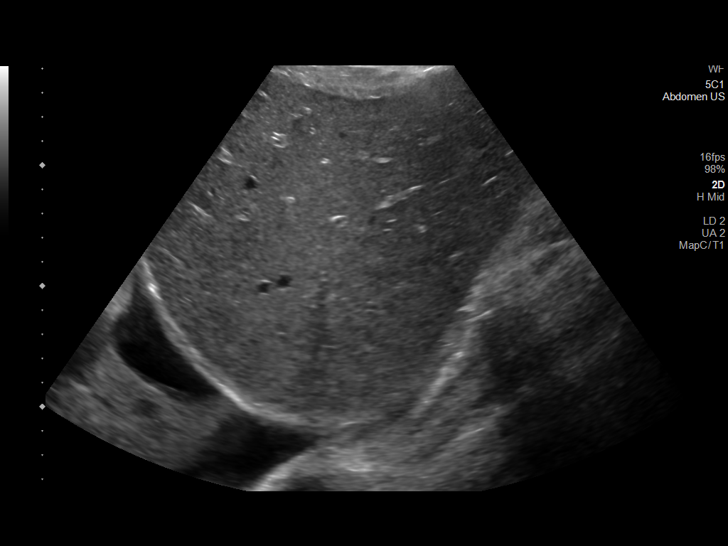
[im 56/61]
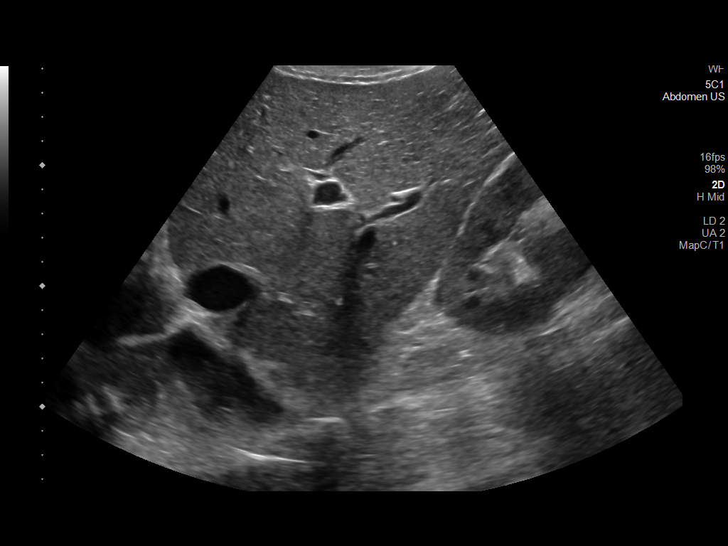
[im 61/61]
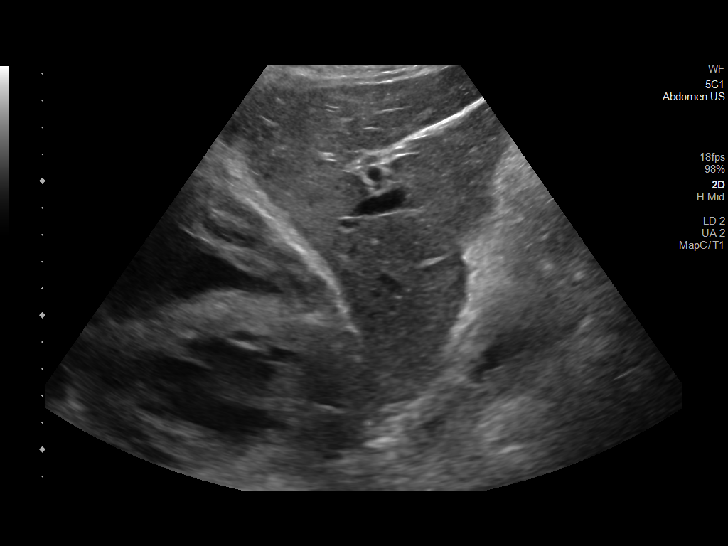

[13 of 25 positions shown; findings below may reference images not displayed]

FINDINGS: Gallbladder:

Moderately thickened gallbladder wall is noted at 4 mm. No
cholelithiasis or pericholecystic fluid is noted. No sonographic
Murphy's sign is noted.

Common bile duct:

Diameter: 1 mm which is within normal limits.

Liver:

2.4 x 2.2 x 0.9 cm hyperechoic focus is noted peripherally in the
posterior segment of right hepatic lobe. Within normal limits in
parenchymal echogenicity. Portal vein is patent on color Doppler
imaging with normal direction of blood flow towards the liver.

Other: None.
IMPRESSION: Moderately thickened gallbladder wall thickening is noted without
cholelithiasis or pericholecystic fluid. There is clinical concern
for cholecystitis, HIDA scan may be performed for further
evaluation.

2.4 x 2.2 x 0.9 cm hyperechoic focus is noted peripherally in
posterior segment of right hepatic lobe which most likely represents
benign hemangioma in the absence of any history of primary
malignancy. Follow-up ultrasound in 6 months is recommended to
ensure stability. If the patient does have a history of malignancy,
then MRI is recommended to evaluate for possible metastatic disease.

## 2021-01-01 MED ORDER — ETOMIDATE 2 MG/ML IV SOLN
INTRAVENOUS | Status: AC
  Filled 2021-01-01: qty 20

## 2021-01-01 NOTE — Progress Notes (Signed)
Diagnostic Bronchoscopy  Luis Hunter  [id number]  Feb 16, 1976  Date:01/01/21  Time:1:47 PM   Provider Performing:Kuba Shepherd   Procedure: Diagnostic Bronchoscopy [id number])  Indication(s) Assist with direct visualization of tracheostomy placement  Consent Risks of the procedure as well as the alternatives and risks of each were explained to the patient and/or caregiver.  Consent for the procedure was obtained.   Anesthesia See separate tracheostomy note   Time Out Verified patient identification, verified procedure, site/side was marked, verified correct patient position, special equipment/implants available, medications/allergies/relevant history reviewed, required imaging and test results available.   Sterile Technique Usual hand hygiene, masks, gowns, and gloves were used   Procedure Description Bronchoscope advanced through endotracheal tube and into airway.  After suctioning out tracheal secretions, bronchoscope used to provide direct visualization of tracheostomy placement.   Complications/Tolerance None; patient tolerated the procedure well.   EBL None  Specimen(s) None

## 2021-01-01 NOTE — Procedures (Signed)
Tracheostomy Change Note  Patient Details:   Name: Luis Hunter DOB: November 23, 1975 MRN: [id number]    Airway Documentation:     Evaluation  O2 sats: stable throughout Complications: No apparent complications Patient did tolerate procedure well. Bilateral Breath Sounds: Rhonchi, Diminished   RT x2 present during bedside tracheostomy procedure along with RN and MD x2. Pt tolerated procedure well with SVS.  Ingrid Shifrin R 01/01/2021, 2:02 PM

## 2021-01-01 NOTE — Progress Notes (Signed)
Phenytoin Follow-Up Consult Indication: Seizures  No Known Allergies  Patient Measurements: Height: _0  (180.3 cm) Weight: 80.6 kg (177 lb 11.1 oz) IBW/kg (Calculated) : 75.3   Body mass index is 24.78 kg/m.   Vital signs: Temp: 99.1 F (37.3 C) (08/29 0300) Temp Source: Axillary (08/29 0400) BP: 123/71 (08/29 0936) Pulse Rate: 97 (08/29 0936)  Labs: Lab Results  Component Value Date/Time   Phenytoin Lvl <2.5 (L) 01/01/2021 0654   Lab Results  Component Value Date   PHENYTOIN <2.5 (L) 01/01/2021   Estimated Creatinine Clearance: 122.9 mL/min (by C-G formula based on SCr of 0.64 mg/dL).   Medications:  Scheduled:   amLODipine  10 mg Per Tube Daily   chlorhexidine gluconate (MEDLINE KIT)  15 mL Mouth Rinse BID   Chlorhexidine Gluconate Cloth  6 each Topical Daily   docusate  100 mg Per Tube BID   enoxaparin (LOVENOX) injection  40 mg Subcutaneous Q24H   etomidate  40 mg Intravenous Once   feeding supplement (PROSource TF)  45 mL Per Tube Daily   fentaNYL (SUBLIMAZE) injection  200 mcg Intravenous Once   free water  100 mL Per Tube Q8H   ipratropium-albuterol  3 mL Nebulization Q6H   lacosamide  100 mg Per Tube Q12H   lactulose  10 g Per Tube TID   levETIRAcetam  1,500 mg Per Tube Q12H   liver oil-zinc oxide   Topical BID   mouth rinse  15 mL Mouth Rinse 10 times per day   metoprolol tartrate  75 mg Per Tube BID   midazolam  5 mg Intravenous Once   pantoprazole sodium  40 mg Per Tube Daily   polyethylene glycol  17 g Per Tube BID   propofol  500 mg Intravenous Once   sennosides  5 mL Per Tube BID   sodium chloride flush  10-40 mL Intracatheter Q12H   sodium chloride flush  3 mL Intravenous Q12H   vecuronium  10 mg Intravenous Once   Infusions:   sodium chloride Stopped (12/31/20 1127)   sodium chloride     dexmedetomidine (PRECEDEX) IV infusion 1 mcg/kg/hr (01/01/21 0700)   feeding supplement (VITAL 1.5 CAL) 1,000 mL (12/31/20 1128)   fentaNYL  infusion INTRAVENOUS 300 mcg/hr (01/01/21 1013)   meropenem (MERREM) IV Stopped (01/01/21 0641)   PRN: sodium chloride, acetaminophen, docusate, fentaNYL, hydrALAZINE, midazolam, polyethylene glycol, prochlorperazine, sodium chloride flush, sodium chloride flush  Assessment: Phenytoin has been trending down, likely as a result of co-administration with TF. Phenytoin was switched from IV on 8/21 which coincides with drop in corrected phenytoin. After discussion with Dr. Hortense Ramal, phenytoin dose increased to 100 mg Q6H PT while on TF 8/23. With phenytoin level plummeting to <2.5 (uncorrected) on 8/24, phenytoin was switched back to IV. Despite changes, phenytoin level has remained undetectable, despite being close to steady state 8/29. After discussion with Dr. Hortense Ramal, will discontinue phenytoin 8/29 as levels have remained undetectable with no noted seizure-like activity. Of note, patient recently developed an increased fever with suspicion for pneumonia on CXR which is the suspected source for decline in clinical status, leading to increased sedation requirements.   8/13 DPH lvl < 2.5, albumin 3.6 8/14 DPH lvl 15 8/15 DPH lvl 14.8, albumin 2.7 >> corrected to 23  mcg/mL. 8/16 DPH lvl 11.8, albumin 2.7 >> corrected to 18 8/17 DPH lvl 11.2, albumin 2.0>>corrected to 22   8/18 DPH lvl 11.2, albumin 1.7>>corrected to 25.45 8/19 Free DPH 3.5  8/20 DPH 10.2,  albumin 1.6>>corrected to 18.9 8/21 DPH 8.1, albumin 1.6>>corrected to 15 8/22 DPH 5.5, albumin 1.6>>corrected to 13  8/23 DPH 2.9, albumin 1.6>> corrected to 6.9 8/24 DPH <2.5, albumin 1.6>>corrected to 5.95 (imprecise as level is <2.5)  8/25 DPH <2.5, albumin 1.5>>corrected to ~6 (imprecise as level is <2.5)  8/26 DPH <2.5, albumin 1.5>>corrected to ~6 (imprecise as level is <2.5)  8/29 DPH free level in process  8/29 DPH <2.5, albumin 1.5>> corrected to ~6?     8/16 phenobarb level 20.6  8/18 phenobarb level 21.6  8/19 Phenobarb 23.7 8/20  Phenobarb 25.1 8/21 Phenobarb 23.2 8/22 Phenobarb 26.4  8/23 Phenobarb 26.4  8/24 Phenobarb 25.9  8/25 Phenobarb 25.1 8/26 Phenobarb 21.5 8/27 phenobarb 19.5 8/28 phenobarb 14.3 8/29 phenobarb 13.3    Plan:   STOP phenytoin  STOP phenytoin/phenobarb levles  F/U neuro recs  Monitor for seizure activity   Adria Dill, PharmD PGY-1 Acute Care Resident  01/01/2021 10:55 AM

## 2021-01-01 NOTE — Progress Notes (Signed)
NAME:  Luis Hunter, MRN:  [id number], DOB:  04-28-1976, LOS: 67 ADMISSION DATE:  12/16/2020, CONSULTATION DATE:  8/13 REFERRING MD:  Starleen Blue EDP [facility], CHIEF COMPLAINT:  Seizure   History of Present Illness:  45 y/o male found unresponsive after having multiple seizures.  Intubated for airway protection and loadded with Keppra.  UDS positive for cocaine and THC.  Transferred to [facility] to [facility] for cont EEG.  Since admission he has had a LHC for an abnormal EKG, but this was felt to be due to severe LVH.  He has been treated with antibiotics for pneumonia.  He has had persistent fevers for days.   Pertinent  Medical History  Schizophrenia Cocaine abuse Bipolar disorder Depression   Significant Hospital Events: Including procedures, antibiotic start and stop dates in addition to other pertinent events   8/13 intubated, transferred to [facility] 8/20 vent dyssynchrony >> add precedex; EEG with periodic triphasic morphology and generalized slowing 8/23 remains encephalopathic, required escalation of sedatives for vent dyssynchrony  8/28 goals of care conversation: plan tracheostomy   Tubes/lines: 8/13 ETT >   Antibiotics 8/16 started on unasyn 8/17 unasyn discontinued 8/18 started on zosyn 8/25 started augmentin for sinusitis 8/28 Meropenem  Cultures 8/16 respiratory aspirate with gram positive cocci, few gram negative rods 8/26 rare serratia trach aspirate, suspect colony since this was treated earlier this admit  Procedures/imaging: 8/22 cardiac catheterization for STEMI-no significant coronary artery disease discovered 8/24 MRI-mastoid fullness, no intracerebral abnormality 8/27 on cEEG  Interim History / Subjective:  No acute events For tracheostomy today  Objective   Blood pressure 123/71, pulse 97, temperature 99.1 F (37.3 C), temperature source Axillary, resp. rate 13, height '5\' 11"'$  (1.803 m), weight 80.6 kg, SpO2 95 %.    Vent Mode: PSV;CPAP FiO2 (%):  [40 %-60 %] 40  % Set Rate:  [12 bmp] 12 bmp Vt Set:  [600 mL] 600 mL PEEP:  [8 cmH20] 8 cmH20 Pressure Support:  [8 cmH20] 8 cmH20 Plateau Pressure:  [16 cmH20-19 cmH20] 16 cmH20   Intake/Output Summary (Last 24 hours) at 01/01/2021 1017 Last data filed at 01/01/2021 0700 Gross per 24 hour  Intake 3021.21 ml  Output 1800 ml  Net 1221.21 ml   Filed Weights   12/30/20 0433 12/31/20 0339 01/01/21 0500  Weight: 88 kg 81.5 kg 80.6 kg    Examination:  General:  In bed on vent HENT: NCAT ETT in place PULM: CTA B, vent supported breathing CV: RRR, no mgr GI: BS+, soft, nontender MSK: normal bulk and tone Neuro: sedated on vent, but opens eyes to voice  Resolved Hospital Problem list     Assessment & Plan:  Acute metabolic encephalopathy : unclear cause, initially felt to be post ictal state, now concern for multi-factorial metabolic encephalopathy and baseline drug use and underlying mental health disorder Minimize sedation Continue PAD protocol for now Stop sedation after tracheostomy  Frequent orientation  Acute hypoxemic respiratory failure Serratia pneumonia Right lower lobe Sinusitis asthma Meropenem 7 days Solumedrol> will d/c Continue duoneb Full mechanical vent support VAP prevention Daily WUA/SBT Tracheostomy today  Epilepsy Continue vimpat, keppra, dilantin  Hypernatremia Continue free water  Poly substance abuse Depression Schizophrenia Bipolar disorder Continue precedex and fenatnyl per PAD protocol while on ventilator, but then after trach consider antipsychotics  Elevated ammonia with abnormal LFT, unclear etiology Check RUQ ultrasound Continue lactulose for now  Constipation Lactulose Miralax prn  Hypertension LVH metoprolol   Best Practice (right click and "Reselect all SmartList  Selections" daily)   Diet/type: tubefeeds DVT prophylaxis: LMWH GI prophylaxis: PPI Lines: N/A Foley:  N/A Code Status:  full code Last date of multidisciplinary  goals of care discussion [8/28]  Labs   CBC: Recent Labs  Lab 12/27/20 0232 12/28/20 0149 12/28/20 0940 12/30/20 0047 12/30/20 0824 12/30/20 1512 12/31/20 0049 12/31/20 2242  WBC 22.6* 21.9*  --  23.1*  --   --  16.4*  --   NEUTROABS 19.1*  --   --   --   --   --   --   --   HGB 9.6* 9.7*   < > 10.0* 10.5* 9.9* 8.5* 9.2*  HCT 29.2* 29.4*   < > 31.0* 31.0* 29.0* 26.6* 27.0*  MCV 88.2 87.2  --  89.9  --   --  91.4  --   PLT 240 287  --  404*  --   --  387  --    < > = values in this interval not displayed.    Basic Metabolic Panel: Recent Labs  Lab 12/28/20 0149 12/28/20 0940 12/28/20 1032 12/28/20 1638 12/29/20 0629 12/30/20 0047 12/30/20 0824 12/30/20 1512 12/31/20 0049 12/31/20 2242 01/01/21 0654  NA 142   < >  --   --  143 143 146* 146* 145 146* 144  K 4.4   < >  --   --  4.6 4.9 5.2* 5.6* 5.2* 4.4 4.4  CL 110  --   --   --  111 111  --   --  110  --  107  CO2 25  --   --   --  26 27  --   --  30  --  31  GLUCOSE 105*  --   --   --  149* 128*  --   --  122*  --  112*  BUN 32*  --   --   --  30* 27*  --   --  34*  --  30*  CREATININE 0.75  --   --   --  0.77 0.69  --   --  0.80  --  0.64  CALCIUM 8.1*  --   --   --  8.0* 8.1*  --   --  8.1*  --  8.2*  MG 1.8  --  2.0 2.1 2.1  --   --   --  2.3  --   --   PHOS 2.4*  --  3.5 2.9 3.0  --   --   --   --   --   --    < > = values in this interval not displayed.   GFR: Estimated Creatinine Clearance: 122.9 mL/min (by C-G formula based on SCr of 0.64 mg/dL). Recent Labs  Lab 12/27/20 0232 12/28/20 0149 12/29/20 0629 12/30/20 0047 12/31/20 0049  PROCALCITON  --  0.83 0.80 0.65  --   WBC 22.6* 21.9*  --  23.1* 16.4*    Liver Function Tests: Recent Labs  Lab 12/28/20 0149 12/29/20 0629 12/30/20 0047 12/31/20 0049  AST 165* 301* 221* 285*  ALT 129* 311* 315* 340*  ALKPHOS 85 105 130* 130*  BILITOT 1.2 1.2 1.2 1.3*  PROT 5.7* 5.9* 6.3* 5.9*  ALBUMIN 1.5* 1.6* 1.7* 1.5*   No results for input(s):  LIPASE, AMYLASE in the last 168 hours. Recent Labs  Lab 12/27/20 0830 12/31/20 1046 01/01/21 0654  AMMONIA 51* 63* 36*    ABG    Component Value  Date/Time   PHART 7.366 12/31/2020 2242   PCO2ART 59.2 (H) 12/31/2020 2242   PO2ART 100 12/31/2020 2242   HCO3 33.8 (H) 12/31/2020 2242   TCO2 36 (H) 12/31/2020 2242   ACIDBASEDEF 1.0 12/21/2020 1101   O2SAT 97.0 12/31/2020 2242     Coagulation Profile: No results for input(s): INR, PROTIME in the last 168 hours.  Cardiac Enzymes: No results for input(s): CKTOTAL, CKMB, CKMBINDEX, TROPONINI in the last 168 hours.  HbA1C: Hgb A1c MFr Bld  Date/Time Value Ref Range Status  11/20/2019 12:50 AM 5.8 (H) 4.8 - 5.6 % Final    Comment:    (NOTE) Pre diabetes:          5.7%-6.4%  Diabetes:              >6.4%  Glycemic control for   <7.0% adults with diabetes     CBG: Recent Labs  Lab 12/31/20 1527 12/31/20 2038 01/01/21 0018 01/01/21 0314 01/01/21 0721  GLUCAP 135* 110* 91 106* 96    Critical care time: 35 minutes    [name], MD [place] [facility] Pager: [phone] Cell: [phone] After 7:00 pm call Elink  [phone]

## 2021-01-01 NOTE — Procedures (Signed)
Percutaneous Tracheostomy Procedure Note   Luis Hunter  [id number]  1975-07-26  Date:01/01/21  Time:1:49 PM   Provider Performing:Brent Kase Shughart  Procedure: Percutaneous Tracheostomy with Bronchoscopic Guidance (31600)  Indication(s) Prolonged respiratory failure with hypoxemia  Consent Risks of the procedure as well as the alternatives and risks of each were explained to the patient and/or caregiver.  Consent for the procedure was obtained.  Anesthesia Etomidate, Versed, Fentanyl, Vecuronium   Time Out Verified patient identification, verified procedure, site/side was marked, verified correct patient position, special equipment/implants available, medications/allergies/relevant history reviewed, required imaging and test results available.   Sterile Technique Maximal sterile technique including sterile barrier drape, hand hygiene, sterile gown, sterile gloves, mask, hair covering.    Procedure Description Appropriate anatomy identified by palpation.  Patient's neck prepped and draped in sterile fashion.  1% lidocaine with epinephrine was used to anesthetize skin overlying neck.  1.5cm incision made and blunt dissection performed until tracheal rings could be easily palpated.   Then a size 8 Shiley tracheostomy was placed under bronchoscopic visualization using usual Seldinger technique and serial dilation.   There was minor bleeding treated by suturing a small superficial vessel.  Bronchoscope confirmed placement above the carina.  Tracheostomy was sutured in place with adhesive pad to protect skin under pressure.    Patient connected to ventilator.   Complications/Tolerance None; patient tolerated the procedure well. Chest X-ray is ordered to confirm no post-procedural complication.   EBL Minimal   Specimen(s) None    [name], MD [place] [facility] Pager: [phone] Cell: [phone] After 7:00 pm call Elink  [phone]

## 2021-01-02 ENCOUNTER — Inpatient Hospital Stay (HOSPITAL_COMMUNITY): Payer: Medicare HMO

## 2021-01-02 DIAGNOSIS — G9340 Encephalopathy, unspecified: Secondary | ICD-10-CM | POA: Diagnosis not present

## 2021-01-02 DIAGNOSIS — J9602 Acute respiratory failure with hypercapnia: Secondary | ICD-10-CM | POA: Diagnosis not present

## 2021-01-02 DIAGNOSIS — G40901 Epilepsy, unspecified, not intractable, with status epilepticus: Secondary | ICD-10-CM | POA: Diagnosis not present

## 2021-01-02 LAB — CBC WITH DIFFERENTIAL/PLATELET
Abs Immature Granulocytes: 0.72 10*3/uL — ABNORMAL HIGH (ref 0.00–0.07)
Basophils Absolute: 0.1 10*3/uL (ref 0.0–0.1)
Basophils Relative: 1 %
Eosinophils Absolute: 0.4 10*3/uL (ref 0.0–0.5)
Eosinophils Relative: 3 %
HCT: 26.6 % — ABNORMAL LOW (ref 39.0–52.0)
Hemoglobin: 8.4 g/dL — ABNORMAL LOW (ref 13.0–17.0)
Immature Granulocytes: 5 %
Lymphocytes Relative: 10 %
Lymphs Abs: 1.4 10*3/uL (ref 0.7–4.0)
MCH: 28.8 pg (ref 26.0–34.0)
MCHC: 31.6 g/dL (ref 30.0–36.0)
MCV: 91.1 fL (ref 80.0–100.0)
Monocytes Absolute: 1.2 10*3/uL — ABNORMAL HIGH (ref 0.1–1.0)
Monocytes Relative: 9 %
Neutro Abs: 9.9 10*3/uL — ABNORMAL HIGH (ref 1.7–7.7)
Neutrophils Relative %: 72 %
Platelets: 452 10*3/uL — ABNORMAL HIGH (ref 150–400)
RBC: 2.92 MIL/uL — ABNORMAL LOW (ref 4.22–5.81)
RDW: 15 % (ref 11.5–15.5)
WBC: 13.7 10*3/uL — ABNORMAL HIGH (ref 4.0–10.5)
nRBC: 0 % (ref 0.0–0.2)

## 2021-01-02 LAB — COMPREHENSIVE METABOLIC PANEL
ALT: 447 U/L — ABNORMAL HIGH (ref 0–44)
AST: 390 U/L — ABNORMAL HIGH (ref 15–41)
Albumin: 1.4 g/dL — ABNORMAL LOW (ref 3.5–5.0)
Alkaline Phosphatase: 140 U/L — ABNORMAL HIGH (ref 38–126)
Anion gap: 6 (ref 5–15)
BUN: 26 mg/dL — ABNORMAL HIGH (ref 6–20)
CO2: 29 mmol/L (ref 22–32)
Calcium: 8.1 mg/dL — ABNORMAL LOW (ref 8.9–10.3)
Chloride: 108 mmol/L (ref 98–111)
Creatinine, Ser: 0.67 mg/dL (ref 0.61–1.24)
GFR, Estimated: >60 mL/min (ref >60)
Glucose, Bld: 128 mg/dL — ABNORMAL HIGH (ref 70–99)
Potassium: 4.3 mmol/L (ref 3.5–5.1)
Sodium: 143 mmol/L (ref 135–145)
Total Bilirubin: 0.9 mg/dL (ref 0.3–1.2)
Total Protein: 5.7 g/dL — ABNORMAL LOW (ref 6.5–8.1)

## 2021-01-02 LAB — GLUCOSE, CAPILLARY
Glucose-Capillary: 112 mg/dL — ABNORMAL HIGH (ref 70–99)
Glucose-Capillary: 119 mg/dL — ABNORMAL HIGH (ref 70–99)
Glucose-Capillary: 120 mg/dL — ABNORMAL HIGH (ref 70–99)
Glucose-Capillary: 122 mg/dL — ABNORMAL HIGH (ref 70–99)
Glucose-Capillary: 124 mg/dL — ABNORMAL HIGH (ref 70–99)
Glucose-Capillary: 134 mg/dL — ABNORMAL HIGH (ref 70–99)
Glucose-Capillary: 137 mg/dL — ABNORMAL HIGH (ref 70–99)

## 2021-01-02 LAB — HEPATITIS PANEL, ACUTE
HCV Ab: NONREACTIVE
Hep A IgM: NONREACTIVE
Hep B C IgM: NONREACTIVE
Hepatitis B Surface Ag: NONREACTIVE

## 2021-01-02 LAB — PHENYTOIN LEVEL, FREE AND TOTAL
Phenytoin, Free: NOT DETECTED ug/mL (ref 1.0–2.0)
Phenytoin, Total: 1.5 ug/mL — ABNORMAL LOW (ref 10.0–20.0)

## 2021-01-02 IMAGING — DX DG CHEST 1V PORT
1 series · 1 of 1 positions shown · non-contrast
Comparison: [DATE]

CLINICAL DATA: Respiratory failure

EXAM:
PORTABLE CHEST 1 VIEW

[chest ap]
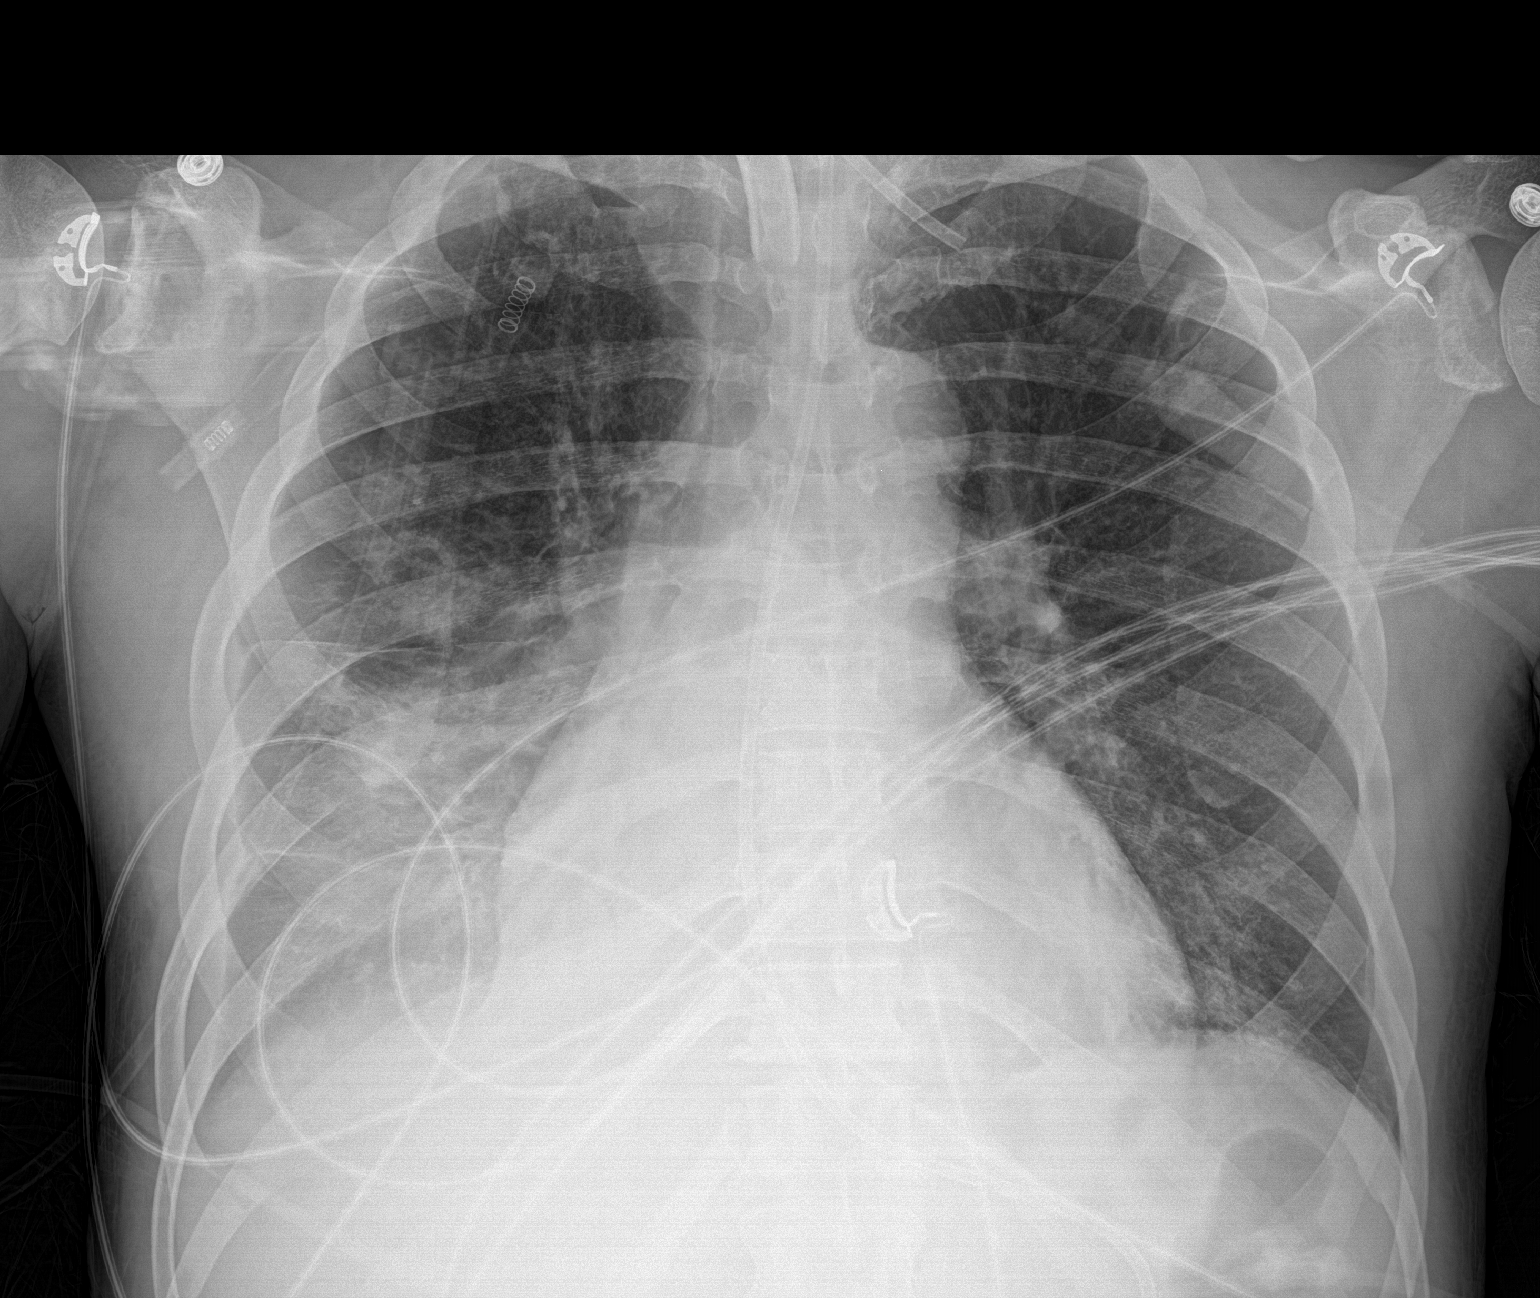

[1 of 1 positions shown; findings below may reference images not displayed]

FINDINGS: Tracheostomy tube and feeding tube remain in place. Stable
cardiomegaly. Small right pleural effusion with patchy airspace
opacities within the right mid to lower lung field. Mild streaky
left basilar opacity. No pneumothorax.
IMPRESSION: Stable radiographic appearance of the chest including small right
pleural effusion with patchy right lower lobe opacity.

## 2021-01-02 MED ORDER — BUDESONIDE 0.25 MG/2ML IN SUSP
0.2500 mg | Freq: Two times a day (BID) | RESPIRATORY_TRACT | Status: DC
  Administered 2021-01-02 – 2021-01-16 (×30): 0.25 mg via RESPIRATORY_TRACT
  Filled 2021-01-02 (×30): qty 2

## 2021-01-02 MED ORDER — ARFORMOTEROL TARTRATE 15 MCG/2ML IN NEBU
15.0000 ug | INHALATION_SOLUTION | Freq: Two times a day (BID) | RESPIRATORY_TRACT | Status: DC
  Administered 2021-01-02 – 2021-01-16 (×28): 15 ug via RESPIRATORY_TRACT
  Filled 2021-01-02 (×33): qty 2

## 2021-01-02 MED ORDER — FENTANYL CITRATE (PF) 100 MCG/2ML IJ SOLN
25.0000 ug | INTRAMUSCULAR | Status: DC | PRN
  Administered 2021-01-02 – 2021-01-08 (×11): 100 ug via INTRAVENOUS
  Filled 2021-01-02 (×11): qty 2

## 2021-01-02 NOTE — Progress Notes (Signed)
Neurology Progress Note  Brief HPI: Luis Hunter is a 45 y.o. with PMHx of Bipolar disorder ([place]), Depression, Schizophrenia ([place]), and Seizures ([place]). He initially presented to [facility] after several witnessed seizures at home. Has a history of substance abuse and medication nonadherence, UDS notably positive for THC and cocaine in addition to the benzodiazepines he was administered for seizure control. Following benzodiazepine administration, he had sonorous respirations and was intubated for airway protection. He was transferred to [facility] for long-term EEG monitoring and continues on this.  Interval History: 8/15 EEG video evidence with left > right upper extremity > lower extremity rhythmic twitching with concomitant EEG sharply contoured 3 to 5 Hz theta-delta slowing which at times appeared rhythmic with triphasic morphology, without definite evolution. Appearance concerning for focal motor seizures.  8/16 EEG with profound diffuse encephalopathy of nonspecific etiology. On Versed and propofol gtt, Keppra 1,500 mg BID, Vimpat 200 mg BID, phenobarbital '65mg'$  BID, and phenytoin 100 mg q8h 8/17 Stopped versed gtt 8/18 - 8/21: Severe diffuse encephalopathy, GPDs with triphasic morphology at 1 Hz which of on ictal-interictal continuum with low potential for seizures 8/22: Stopped sedation, reduced vimpat to 100 mg BID, continues to be comatose 8/23: Aspiration pneumonia with Serratia; continues to be comatose. Subtherapeutic phenytoin, phenytoin increased to 100 mg q6h 8/24: Phenytoin subtherapeutic despite increasing dosages, transitioned from PO to IV. Discontinued phenobarbital to minimize sedation. MRI brain without acute or focal intracranial finding. LTM EEG 8/27: no seizures  Subjective: Patient with respiratory complications overnight requiring increased sedation, concern for asthma exacerbation; On fentanyl gtt.    Exam: Vitals:   01/02/21 0736 01/02/21 0750  BP: (!) 168/99   Pulse:  (!) 115   Resp: (!) 25   Temp:  (!) 100.5 F (38.1 C)  SpO2: 98%    Gen: Critically ill appearing male, intubated and sedated in the ICU Resp: Respirations assisted via mechanical ventilator, ETT secured, tachypnea on cardiac monitor Abd: soft, non-distended  Neuro: Mental Status: Opens eyes to voice, follows simple commands Cranial Nerves:PERRL 2 mm and brisk, midline gaze, corneal reflexes are intact, no cough or gag appreciated. Motor: Able to wiggle fingers on R side, and wiggle toes bilat Sensory: minimal withdrawal RUE and BUE Plantars: Left toe up going, right mute Gait: Deferred  Pertinent Labs: CBC    Component Value Date/Time   WBC 13.7 (H) 01/02/2021 0133   RBC 2.92 (L) 01/02/2021 0133   HGB 8.4 (L) 01/02/2021 0133   HGB 14.6 03/12/2014 1249   HCT 26.6 (L) 01/02/2021 0133   HCT 44.5 03/12/2014 1249   PLT 452 (H) 01/02/2021 0133   PLT 183 03/12/2014 1249   MCV 91.1 01/02/2021 0133   MCV 90 03/12/2014 1249   MCH 28.8 01/02/2021 0133   MCHC 31.6 01/02/2021 0133   RDW 15.0 01/02/2021 0133   RDW 15.4 (H) 03/12/2014 1249   LYMPHSABS 1.4 01/02/2021 0133   MONOABS 1.2 (H) 01/02/2021 0133   EOSABS 0.4 01/02/2021 0133   BASOSABS 0.1 01/02/2021 0133   CMP     Component Value Date/Time   NA 143 01/02/2021 0133   NA 139 03/12/2014 1249   K 4.3 01/02/2021 0133   K 3.4 (L) 03/12/2014 1249   CL 108 01/02/2021 0133   CL 106 03/12/2014 1249   CO2 29 01/02/2021 0133   CO2 27 03/12/2014 1249   GLUCOSE 128 (H) 01/02/2021 0133   GLUCOSE 127 (H) 03/12/2014 1249   BUN 26 (H) 01/02/2021 0133   BUN  14 03/12/2014 1249   CREATININE 0.67 01/02/2021 0133   CREATININE 1.09 11/16/2015 1417   CALCIUM 8.1 (L) 01/02/2021 0133   CALCIUM 8.9 03/12/2014 1249   PROT 5.7 (L) 01/02/2021 0133   PROT 7.5 03/12/2014 1249   ALBUMIN 1.4 (L) 01/02/2021 0133   ALBUMIN 4.0 03/12/2014 1249   AST 390 (H) 01/02/2021 0133   AST 27 03/12/2014 1249   ALT 447 (H) 01/02/2021 0133   ALT 30  03/12/2014 1249   ALKPHOS 140 (H) 01/02/2021 0133   ALKPHOS 76 03/12/2014 1249   BILITOT 0.9 01/02/2021 0133   BILITOT 0.6 03/12/2014 1249   GFRNONAA >60 01/02/2021 0133   GFRNONAA >60 03/12/2014 1249   GFRNONAA >60 10/13/2012 1601   GFRAA >60 12/07/2019 1052   GFRAA >60 03/12/2014 1249   GFRAA >60 10/13/2012 1601    Ref. Range 12/22/2020 10:05 12/28/2020 11:55  Amphetamines Latest Ref Range: NONE DETECTED  NONE DETECTED NONE DETECTED  Barbiturates Latest Ref Range: NONE DETECTED  POSITIVE (A) POSITIVE (A)  Benzodiazepines Latest Ref Range: NONE DETECTED  POSITIVE (A) POSITIVE (A)  Opiates Latest Ref Range: NONE DETECTED  NONE DETECTED NONE DETECTED  COCAINE Latest Ref Range: NONE DETECTED  POSITIVE (A) NONE DETECTED  Tetrahydrocannabinol Latest Ref Range: NONE DETECTED  POSITIVE (A) POSITIVE (A)    Lab Results  Component Value Date   TSH 2.776 12/27/2020   Lab Results  Component Value Date   FOLATE 8.0 12/27/2020   Lab Results  Component Value Date   VITAMINB12 884 12/27/2020   Component Ref Range & Units 00:47 1 d ago 2 d ago 3 d ago 4 d ago 5 d ago 6 d ago  Phenobarbital 15.0 - 30.0 ug/mL 19.5  21.5 CM  25.1 CM  25.9 CM  26.4 CM  26.4 CM  23.2 CM    Component Ref Range & Units 00:47 1 d ago 2 d ago 3 d ago 4 d ago 5 d ago 6 d ago  Phenytoin Lvl 10.0 - 20.0 ug/mL <2.5 Low   <2.5 Low  CM  <2.5 Low  CM  <2.5 Low  CM  2.9 Low  CM  5.5 Low  CM  8.1 Low  CM    Thiamine 202.6  Imaging Reviewed: Most recent MRI brain was completed on 12/27/2020 due to his persistent somnolence and did not show any acute process there was a mild increase in flair signal within the CSF attributed to mechanical ventilation and bilateral mastoid effusions and inflammatory changes of the paranasal sinuses  EEG overnight 12/29/2020 - 12/30/2020: "This study is suggestive of moderate to severe diffuse encephalopathy, nonspecific etiology. No seizures or definite epileptiform discharges were seen during  this study.   Two events of sudden hypertension and diaphoresis were noted as described above without concomitant eeg change. These events were NOT epileptic."  Assessment and Plan: 45 year old male with history of epilepsy who presented with status epilepticus in the setting of medication noncompliance.   Status epilepticus (resolved) Epilepsy with breakthrough seizure Acute encephalopathy, postictal and medication induced - Exam slightly improved today with some ability to follow commands -Most likely multifactorial encephalopathy due to infection, medications, drug use, prolonged postictal state.   -Requiring increased sedation for ventilator management due to likely asthma exacerbation per CCM  Recommendations -Normal TSH, folate and B12.  Slightly elevated thiamine. Thiamine supplementation discontinued. -Discontinue LTM EEG as he has not had evidence of seizures. Do not expect improvement in neurologic evaluation in the setting of increasing  sedation for respiratory status and ventilator compliance.  -Continue Keppra to 1500 mg twice daily, Vimpat 100 mg twice daily, continue phenytoin 100 mg IV every 6 hours for now;  -Continue seizure precautions -As needed IV versed for clinical seizure-like activity -Once patient is stabilized from a respiratory standpoint, weaning sedation again; can repeat spot EEG or LTM EEG if mental status remains poor or clinical concern for seizure activity arises.  -Neurology will be available on an as-needed basis, please consult if new questions arise -Discussed plan with CCM team at bedside  -Management of rest of comorbidities per primary team -Anticipatory guidance:with sz he had left>right UE>LE rhythmic twitching. Can increase lacosamide to 200 if any sz overnight and order video EEG again. discussed with CCM team that EEG can be reconsidered once they are weaning his sedation again after asthma exacerbation treatment if further clinical concern for  seizures arise or if his mental status remains poor with weaning sedation  - If this occurs please re-engage neurology. For now we will not continue to actively follow.  Su Monks, MD [facility] [phone]  If 7pm- 7am, please page neurology on call as listed in [place].'

## 2021-01-02 NOTE — TOC Initial Note (Addendum)
Transition of Care [facility]) - Initial/Assessment Note    Patient Details  Name: Luis Hunter MRN: [id number] Date of Birth: Oct 05, 1975  Transition of Care [facility]) CM/SW Contact:    Sharin Mons, RN Phone Number: 01/02/2021, 2:07 PM  Clinical Narrative:                 Pt transferred from [facility] 8/13 for management of status epilepticus, encephalopathy. Hx of Poly substance abuse, depression, Schizophrenia, Bipolar disorder.          -s/p trach 8/28, remains on ventilator  NCM spoke with sister Rene Kocher [phone] POC) regarding d/c planning. Arianne states PTA patient independent with ADL's , no DME usage. ResideS with girlfriend [place] who lives in [place]. Pt doesn't work, states on disability. Pt has one daughter,Kiarra, who lives in a group home. States if d/c to home, girlfriend Tianna [phone]) plans to care for pt.  TOC team monitoring and following for TOC needs .Marland Kitchen...  01/03/2021 @ 1000 NCM spoke with sister Arianne regarding pt transitioning to [facility] facility once medically ready. Arianne agreeable to LTAC placement for pt. NCM provived choice. [facility]rian[facility]. Referral made with Jennifer/ [facility] liaison  for LTAC placement by phone, voice message left.  Expected Discharge Plan: [place] Barriers to Discharge: Continued Medical Work up   Patient Goals and CMS Choice        Expected Discharge Plan and Services Expected Discharge Plan: [place]   Discharge Planning Services: CM Consult   Living arrangements for the past 2 months: Single Family Home                                      Prior Living Arrangements/Services Living arrangements for the past 2 months: Single Family Home Lives with:: Significant Other                   Activities of Daily Living      Permission Sought/Granted                  Emotional Assessment              Admission diagnosis:   Status epilepticus ([place]) [G40.901] Patient Active Problem List   Diagnosis Date Noted   Acute respiratory failure with hypercapnia (HCC)    Encephalopathy acute    ST elevation myocardial infarction (STEMI) ([place])    Status epilepticus ([place]) 12/16/2020   Polysubstance abuse ([place]) 11/19/2019   Schizophrenia ([place])    Bipolar disorder ([place])    Elevated troponin    Unresponsiveness    Tobacco abuse    Leukocytosis    Seizure ([place]) 06/05/2017   BPPV (benign paroxysmal positional vertigo) 11/16/2015   Dizziness 11/16/2015   PCP:  [facility], [place], [place] [address] [place] [address] Phone: [phone] Fax: [phone]     Social Determinants of Health (SDOH) Interventions    Readmission Risk Interventions No flowsheet data found.

## 2021-01-02 NOTE — Progress Notes (Signed)
NAME:  Luis Hunter, MRN:  [id number], DOB:  [birth year], LOS: 3 ADMISSION DATE:  12/16/2020, CONSULTATION DATE:  8/13 REFERRING MD:  Starleen Blue EDP [facility], CHIEF COMPLAINT:  Seizure   History of Present Illness:  45 y/o male found unresponsive after having multiple seizures.  Intubated for airway protection and loadded with Keppra.  UDS positive for cocaine and THC.  Transferred to [facility] to [facility] for cont EEG.  Since admission he has had a LHC for an abnormal EKG, but this was felt to be due to severe LVH.  He has been treated with antibiotics for pneumonia.  He has had persistent fevers for days.   Pertinent  Medical History  Schizophrenia Cocaine abuse Bipolar disorder Depression  Significant Hospital Events: Including procedures, antibiotic start and stop dates in addition to other pertinent events   8/13 intubated, transferred to [facility] 8/20 vent dyssynchrony >> add precedex; EEG with periodic triphasic morphology and generalized slowing 8/23 remains encephalopathic, required escalation of sedatives for vent dyssynchrony  8/28 goals of care conversation: plan tracheostomy  8/29 dilantin stopped by neurology  Tubes/lines: 8/13 ETT > 8/29 8/29 tracheostomy >   Antibiotics 8/16 started on unasyn 8/17 unasyn discontinued 8/18 started on zosyn 8/25 started augmentin for sinusitis 8/28 Meropenem  Cultures 8/16 respiratory aspirate with gram positive cocci, few gram negative rods 8/26 resp culture > serratia   Procedures/imaging: 8/22 cardiac catheterization for STEMI-no significant coronary artery disease discovered 8/24 MRI-mastoid fullness, no intracerebral abnormality 8/27 on cEEG 8/29 RUQ ultrasound > hepatic hemangioma  Interim History / Subjective:  Had tracheostomy yesterday More awake and alert today Having bowel movements Dilantin stopped yesterday  Objective   Blood pressure (!) 154/94, pulse (!) 119, temperature (!) 100.5 F (38.1 C), temperature source Oral, resp.  rate (!) 22, height '5\' 11"'$  (1.803 m), weight 84.9 kg, SpO2 93 %.    Vent Mode: PRVC FiO2 (%):  [40 %] 40 % Set Rate:  [12 bmp] 12 bmp Vt Set:  [600 mL] 600 mL PEEP:  [8 cmH20] 8 cmH20 Pressure Support:  [8 cmH20] 8 cmH20 Plateau Pressure:  [6 cmH20-15 cmH20] 15 cmH20   Intake/Output Summary (Last 24 hours) at 01/02/2021 0948 Last data filed at 01/02/2021 0800 Gross per 24 hour  Intake 2949.57 ml  Output 2012.2 ml  Net 937.37 ml   Filed Weights   12/31/20 0339 01/01/21 0500 01/02/21 0416  Weight: 81.5 kg 80.6 kg 84.9 kg    Examination:  General:  In bed on vent HENT: NCAT tracheostomy in place PULM: CTA B, vent supported breathing CV: RRR, no mgr GI: BS+, soft, nontender MSK: normal bulk and tone Neuro: awake today, R eye ptosis noticed   Resolved Hospital Problem list     Assessment & Plan:    Acute metabolic encephalopathy : unclear cause, initially felt to be post ictal state, now concern for multi-factorial metabolic encephalopathy and baseline drug use and underlying mental health disorder Minimize sedation Continue PAD protocol for now Stop fentanyl infusion, change to prn Continue precedex  Acute hypoxemic respiratory failure Serratia pneumonia Right lower lobe Sinusitis Asthma, presumed Meropenem 7 days Start brovana/pulmicort> can transition to [facility] when off vent and then will need outpatient PFT VAP prevention Daily WUA/SBT Trach care per routine  Epilepsy Continue vimpat, keppra  Hypernatremia > improved Continue free water  Poly substance abuse Depression Schizophrenia Bipolar disorder Continue precedex today Changing fentanyl to prn Continue PAD protocol Likely start antipsychotics tomorrow  Elevated ammonia with abnormal LFT, unclear  etiology Transaminitis of uncertain etiology Liver hemangioma D/c lactulose Will need outpatient repeat imaging of hepatic hemangioma  Constipation > resolved Miralax  prn  Hypertension LVH metoprolol  Best Practice (right click and "Reselect all SmartList Selections" daily)   Diet/type: tubefeeds DVT prophylaxis: LMWH GI prophylaxis: PPI Lines: N/A Foley:  N/A Code Status:  full code Last date of multidisciplinary goals of care discussion [8/28]  Labs   CBC: Recent Labs  Lab 12/27/20 0232 12/28/20 0149 12/28/20 0940 12/30/20 0047 12/30/20 0824 12/30/20 1512 12/31/20 0049 12/31/20 2242 01/02/21 0133  WBC 22.6* 21.9*  --  23.1*  --   --  16.4*  --  13.7*  NEUTROABS 19.1*  --   --   --   --   --   --   --  9.9*  HGB 9.6* 9.7*   < > 10.0* 10.5* 9.9* 8.5* 9.2* 8.4*  HCT 29.2* 29.4*   < > 31.0* 31.0* 29.0* 26.6* 27.0* 26.6*  MCV 88.2 87.2  --  89.9  --   --  91.4  --  91.1  PLT 240 287  --  404*  --   --  387  --  452*   < > = values in this interval not displayed.    Basic Metabolic Panel: Recent Labs  Lab 12/28/20 0149 12/28/20 0940 12/28/20 1032 12/28/20 1638 12/29/20 0629 12/30/20 0047 12/30/20 0824 12/30/20 1512 12/31/20 0049 12/31/20 2242 01/01/21 0654 01/02/21 0133  NA 142   < >  --   --  143 143   < > 146* 145 146* 144 143  K 4.4   < >  --   --  4.6 4.9   < > 5.6* 5.2* 4.4 4.4 4.3  CL 110  --   --   --  111 111  --   --  110  --  107 108  CO2 25  --   --   --  26 27  --   --  30  --  31 29  GLUCOSE 105*  --   --   --  149* 128*  --   --  122*  --  112* 128*  BUN 32*  --   --   --  30* 27*  --   --  34*  --  30* 26*  CREATININE 0.75  --   --   --  0.77 0.69  --   --  0.80  --  0.64 0.67  CALCIUM 8.1*  --   --   --  8.0* 8.1*  --   --  8.1*  --  8.2* 8.1*  MG 1.8  --  2.0 2.1 2.1  --   --   --  2.3  --   --   --   PHOS 2.4*  --  3.5 2.9 3.0  --   --   --   --   --   --   --    < > = values in this interval not displayed.   GFR: Estimated Creatinine Clearance: 122.9 mL/min (by C-G formula based on SCr of 0.67 mg/dL). Recent Labs  Lab 12/28/20 0149 12/29/20 0629 12/30/20 0047 12/31/20 0049 01/02/21 0133   PROCALCITON 0.83 0.80 0.65  --   --   WBC 21.9*  --  23.1* 16.4* 13.7*    Liver Function Tests: Recent Labs  Lab 12/28/20 0149 12/29/20 0629 12/30/20 0047 12/31/20 0049 01/02/21 0133  AST 165* 301* 221*  285* 390*  ALT 129* 311* 315* 340* 447*  ALKPHOS 85 105 130* 130* 140*  BILITOT 1.2 1.2 1.2 1.3* 0.9  PROT 5.7* 5.9* 6.3* 5.9* 5.7*  ALBUMIN 1.5* 1.6* 1.7* 1.5* 1.4*   No results for input(s): LIPASE, AMYLASE in the last 168 hours. Recent Labs  Lab 12/27/20 0830 12/31/20 1046 01/01/21 0654  AMMONIA 51* 63* 36*    ABG    Component Value Date/Time   PHART 7.366 12/31/2020 2242   PCO2ART 59.2 (H) 12/31/2020 2242   PO2ART 100 12/31/2020 2242   HCO3 33.8 (H) 12/31/2020 2242   TCO2 36 (H) 12/31/2020 2242   ACIDBASEDEF 1.0 12/21/2020 1101   O2SAT 97.0 12/31/2020 2242     Coagulation Profile: No results for input(s): INR, PROTIME in the last 168 hours.  Cardiac Enzymes: No results for input(s): CKTOTAL, CKMB, CKMBINDEX, TROPONINI in the last 168 hours.  HbA1C: Hgb A1c MFr Bld  Date/Time Value Ref Range Status  11/20/2019 12:50 AM 5.8 (H) 4.8 - 5.6 % Final    Comment:    (NOTE) Pre diabetes:          5.7%-6.4%  Diabetes:              >6.4%  Glycemic control for   <7.0% adults with diabetes     CBG: Recent Labs  Lab 01/01/21 1529 01/01/21 2010 01/02/21 0014 01/02/21 0406 01/02/21 0748  GLUCAP 110* 120* 122* 124* 112*    Critical care time: 35 minutes    [name], MD [place] [facility] Pager: [phone] Cell: [phone] After 7:00 pm call Elink  [phone]

## 2021-01-02 NOTE — Progress Notes (Signed)
RT at bedside due to ventilator alarm sounding. Pt's HR increased to 130's, increased RR in 30's with increased WOB. RT placed pt back on full ventilator support. RT will continue to monitor pt.

## 2021-01-02 NOTE — Progress Notes (Signed)
SLP Cancellation Note  Patient Details Name: Luis Hunter MRN: [id number] DOB: 1976-04-07   Cancelled treatment:       Reason Eval/Treat Not Completed: Medical issues which prohibited therapy. Per chart review pt back on full vent support. Will continue to follow for readiness for PMSV evaluation and clinical swallow evaluation.   [place], [place] [facility]   01/02/2021, 1:23 PM

## 2021-01-02 NOTE — Progress Notes (Signed)
PT Cancellation Note  Patient Details Name: RECTOR BELYEU MRN: [id number] DOB: 10/20/1975   Cancelled Treatment:    Reason Eval/Treat Not Completed: Patient not medically ready (pt tachy at 120 at rest, non responsive weaning sedation and per RN not yet appropriate)   Aleksei Goodlin B Holly Iannaccone 01/02/2021, 8:23 AM Bayard Males, PT Acute Rehabilitation Services Pager: [phone] Office: [phone]

## 2021-01-03 ENCOUNTER — Inpatient Hospital Stay (HOSPITAL_COMMUNITY): Payer: Medicare HMO

## 2021-01-03 DIAGNOSIS — J9602 Acute respiratory failure with hypercapnia: Secondary | ICD-10-CM | POA: Diagnosis not present

## 2021-01-03 LAB — CBC WITH DIFFERENTIAL/PLATELET
Abs Immature Granulocytes: 0.5 10*3/uL — ABNORMAL HIGH (ref 0.00–0.07)
Basophils Absolute: 0 10*3/uL (ref 0.0–0.1)
Basophils Relative: 0 %
Eosinophils Absolute: 0.5 10*3/uL (ref 0.0–0.5)
Eosinophils Relative: 3 %
HCT: 26 % — ABNORMAL LOW (ref 39.0–52.0)
Hemoglobin: 8.5 g/dL — ABNORMAL LOW (ref 13.0–17.0)
Lymphocytes Relative: 6 %
Lymphs Abs: 0.9 10*3/uL (ref 0.7–4.0)
MCH: 28.9 pg (ref 26.0–34.0)
MCHC: 32.7 g/dL (ref 30.0–36.0)
MCV: 88.4 fL (ref 80.0–100.0)
Metamyelocytes Relative: 2 %
Monocytes Absolute: 0.5 10*3/uL (ref 0.1–1.0)
Monocytes Relative: 3 %
Myelocytes: 1 %
Neutro Abs: 12.9 10*3/uL — ABNORMAL HIGH (ref 1.7–7.7)
Neutrophils Relative %: 85 %
Platelets: 425 10*3/uL — ABNORMAL HIGH (ref 150–400)
RBC: 2.94 MIL/uL — ABNORMAL LOW (ref 4.22–5.81)
RDW: 14.6 % (ref 11.5–15.5)
WBC: 15.2 10*3/uL — ABNORMAL HIGH (ref 4.0–10.5)
nRBC: 0.1 % (ref 0.0–0.2)
nRBC: 1 /100 WBC — ABNORMAL HIGH

## 2021-01-03 LAB — COMPREHENSIVE METABOLIC PANEL
ALT: 485 U/L — ABNORMAL HIGH (ref 0–44)
AST: 315 U/L — ABNORMAL HIGH (ref 15–41)
Albumin: 1.5 g/dL — ABNORMAL LOW (ref 3.5–5.0)
Alkaline Phosphatase: 144 U/L — ABNORMAL HIGH (ref 38–126)
Anion gap: 5 (ref 5–15)
BUN: 19 mg/dL (ref 6–20)
CO2: 28 mmol/L (ref 22–32)
Calcium: 7.8 mg/dL — ABNORMAL LOW (ref 8.9–10.3)
Chloride: 107 mmol/L (ref 98–111)
Creatinine, Ser: 0.56 mg/dL — ABNORMAL LOW (ref 0.61–1.24)
GFR, Estimated: >60 mL/min (ref >60)
Glucose, Bld: 138 mg/dL — ABNORMAL HIGH (ref 70–99)
Potassium: 3.7 mmol/L (ref 3.5–5.1)
Sodium: 140 mmol/L (ref 135–145)
Total Bilirubin: 0.7 mg/dL (ref 0.3–1.2)
Total Protein: 5.9 g/dL — ABNORMAL LOW (ref 6.5–8.1)

## 2021-01-03 LAB — GLUCOSE, CAPILLARY
Glucose-Capillary: 102 mg/dL — ABNORMAL HIGH (ref 70–99)
Glucose-Capillary: 121 mg/dL — ABNORMAL HIGH (ref 70–99)
Glucose-Capillary: 123 mg/dL — ABNORMAL HIGH (ref 70–99)
Glucose-Capillary: 126 mg/dL — ABNORMAL HIGH (ref 70–99)
Glucose-Capillary: 127 mg/dL — ABNORMAL HIGH (ref 70–99)
Glucose-Capillary: 131 mg/dL — ABNORMAL HIGH (ref 70–99)

## 2021-01-03 IMAGING — CT CT HEAD W/O CM
3 of 5 series · 14 of 47 positions shown, 16 images · non-contrast
Comparison: None.

CLINICAL DATA: Acute neurologic deficit

EXAM:
CT HEAD WITHOUT CONTRAST
TECHNIQUE: Contiguous axial images were obtained from the base of the skull
through the vertex without intravenous contrast.

[Series 5: head 5.0 h30s · axial · 0.49mm/px · z∈[+1117,+1267]mm · 8 of 36 slices shown, 10 images]
[im 3/36  brain]
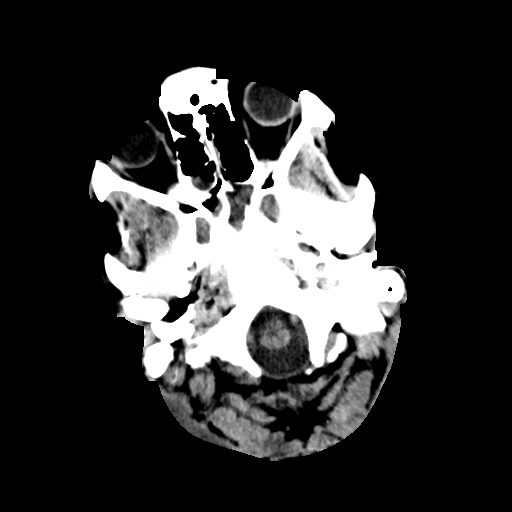
[im 3/36  bone]
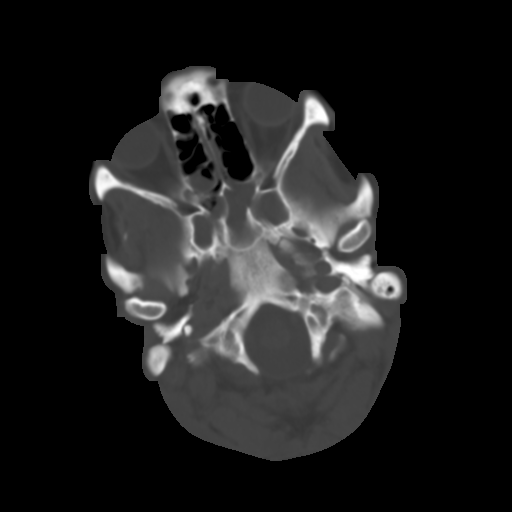
[im 9/36  brain]
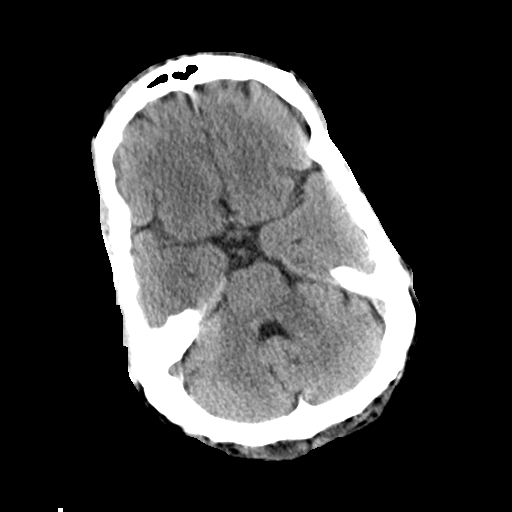
[im 12/36  brain]
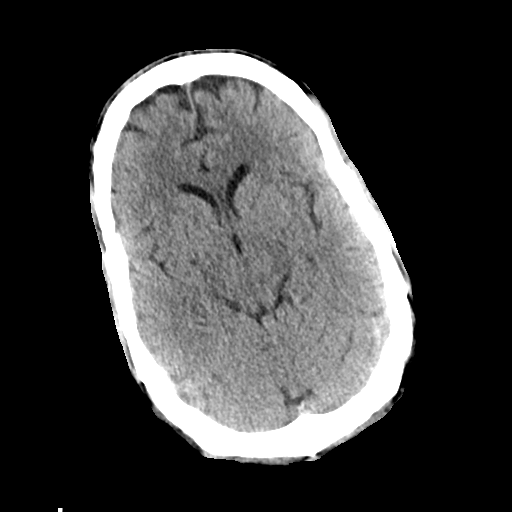
[im 15/36  brain]
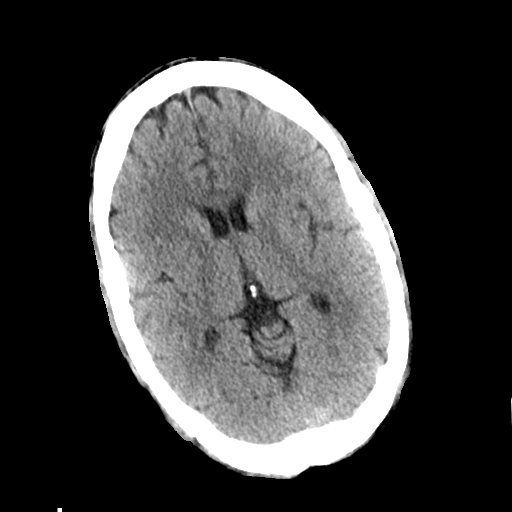
[im 21/36  brain]
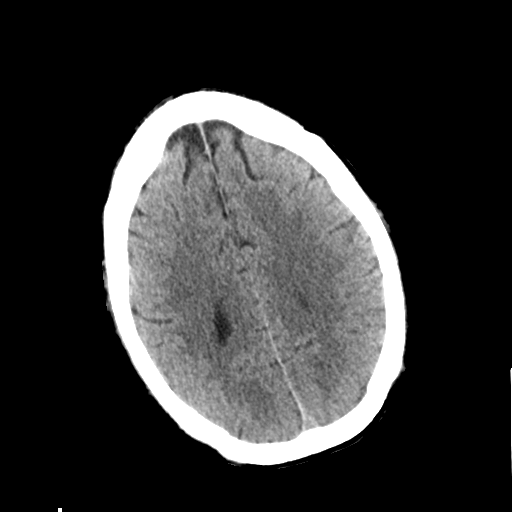
[im 21/36  bone]
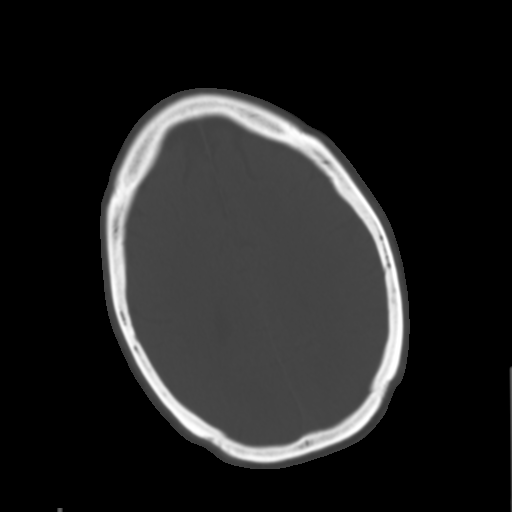
[im 24/36  brain]
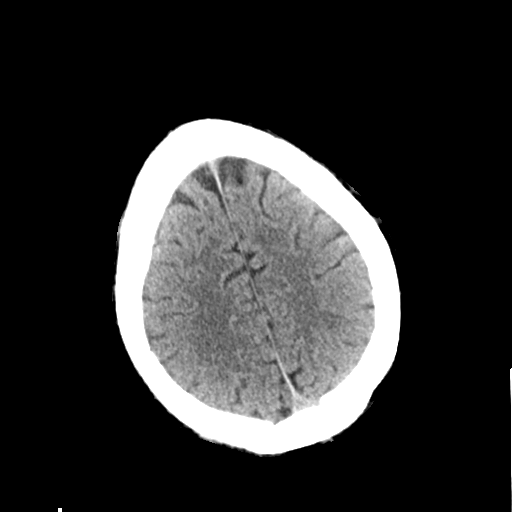
[im 27/36  brain]
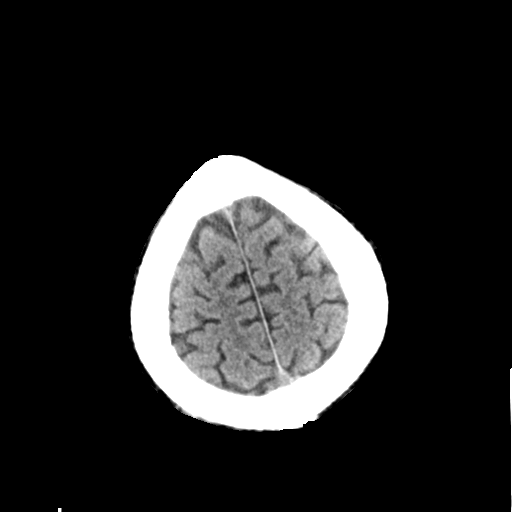
[im 33/36  brain]
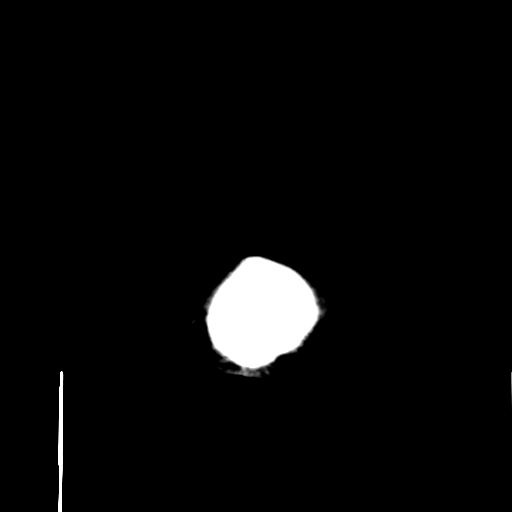

[Series 7: head 3.0 mpr cor · coronal · 0.35mm/px · 3 of 75 slices shown]
[im 25/75  brain]
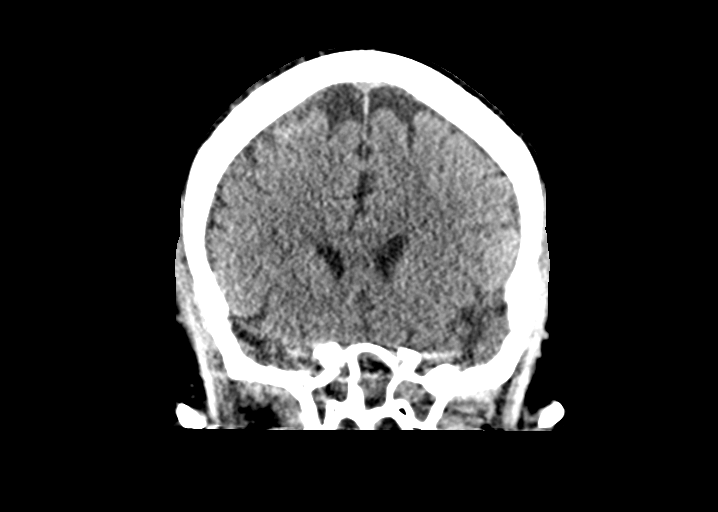
[im 33/75  brain]
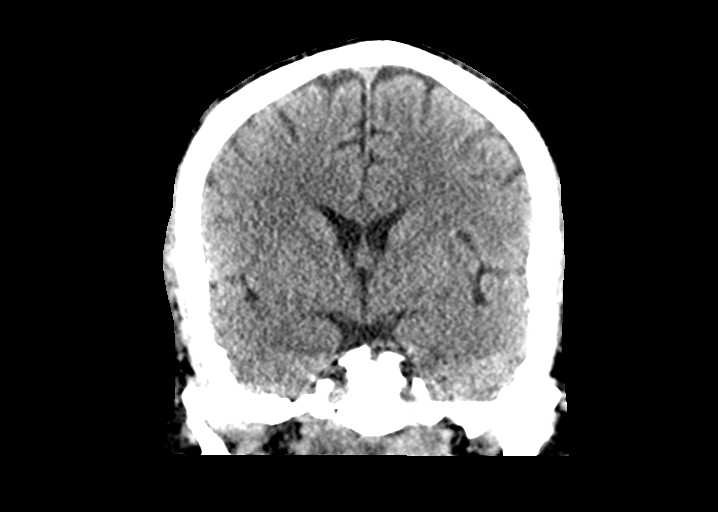
[im 42/75  brain]
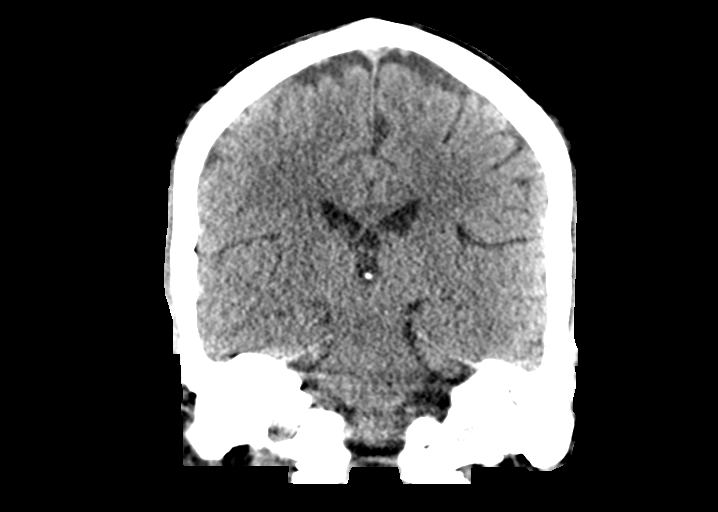

[Series 8: head 3.0 mpr sag · sagittal · 0.35mm/px · 3 of 56 slices shown]
[im 19/56  brain]
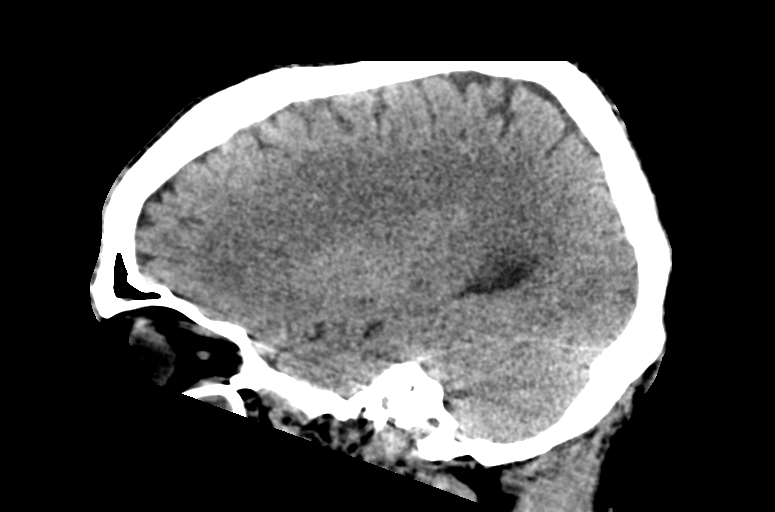
[im 28/56  brain]
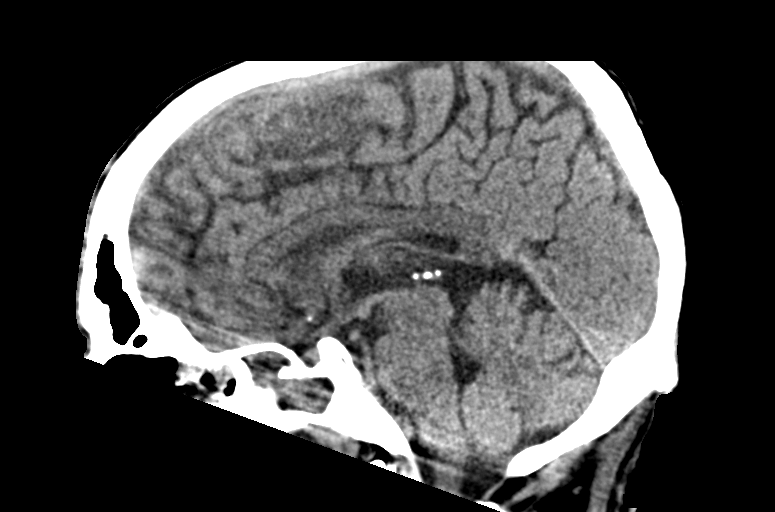
[im 37/56  brain]
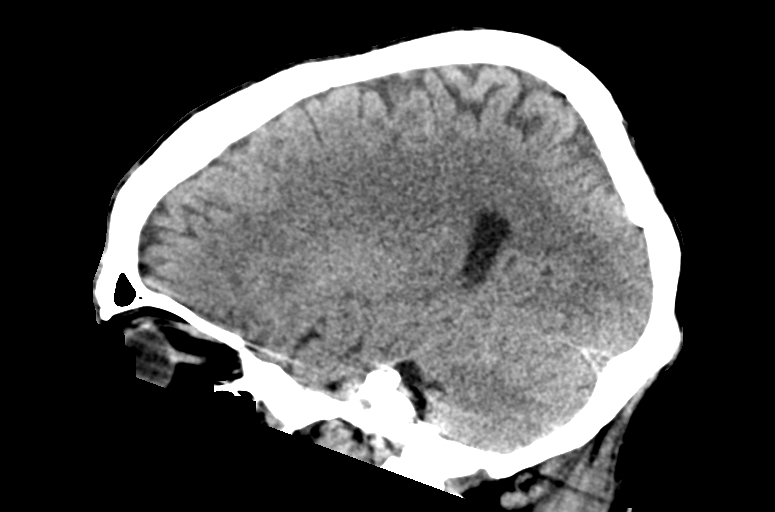

[14 of 47 positions shown; findings below may reference images not displayed]

FINDINGS: Brain: There is no mass, hemorrhage or extra-axial collection. The
size and configuration of the ventricles and extra-axial CSF spaces
are normal. The brain parenchyma is normal, without acute or chronic
infarction.

Vascular: No abnormal hyperdensity of the major intracranial
arteries or dural venous sinuses. No intracranial atherosclerosis.

Skull: The visualized skull base, calvarium and extracranial soft
tissues are normal.

Sinuses/Orbits: No fluid levels or advanced mucosal thickening of
the visualized paranasal sinuses. No mastoid or middle ear effusion.
The orbits are normal.
IMPRESSION: Normal head CT.

## 2021-01-03 MED ORDER — HYDROCHLOROTHIAZIDE 25 MG PO TABS
25.0000 mg | ORAL_TABLET | Freq: Every day | ORAL | Status: DC
  Administered 2021-01-03 – 2021-01-10 (×8): 25 mg
  Filled 2021-01-03 (×9): qty 1

## 2021-01-03 MED ORDER — CLONAZEPAM 0.5 MG PO TBDP
0.5000 mg | ORAL_TABLET | Freq: Two times a day (BID) | ORAL | Status: DC
  Administered 2021-01-03 – 2021-01-04 (×4): 0.5 mg
  Filled 2021-01-03 (×4): qty 1

## 2021-01-03 MED ORDER — IPRATROPIUM-ALBUTEROL 0.5-2.5 (3) MG/3ML IN SOLN
3.0000 mL | Freq: Four times a day (QID) | RESPIRATORY_TRACT | Status: DC | PRN
  Administered 2021-01-05: 3 mL via RESPIRATORY_TRACT
  Filled 2021-01-03: qty 3

## 2021-01-03 MED ORDER — CLONAZEPAM 0.1 MG/ML ORAL SUSPENSION
0.5000 mg | Freq: Two times a day (BID) | ORAL | Status: DC
  Filled 2021-01-03: qty 5

## 2021-01-03 MED ORDER — LORAZEPAM 2 MG/ML IJ SOLN
1.0000 mg | Freq: Three times a day (TID) | INTRAMUSCULAR | Status: DC | PRN
  Administered 2021-01-03 – 2021-01-10 (×4): 1 mg via INTRAVENOUS
  Filled 2021-01-03 (×7): qty 1

## 2021-01-03 NOTE — Progress Notes (Signed)
SLP Cancellation Note  Patient Details Name: Luis Hunter MRN: [id number] DOB: [birth year]   Cancelled treatment:       Reason Eval/Treat Not Completed: Patient not medically ready (Pt currently remains on the vent. Per Janett Billow, RN's report, there are plans to attempt weaning today, but he likely will not be ready for PMSV due to baseline RR in high 30-40s. SLP will follow up on a subsequent date.)  Kc Summerson I. Hardin Negus, [place], [place] Office number [phone] Pager [place] 01/03/2021, 12:09 PM

## 2021-01-03 NOTE — Progress Notes (Signed)
NAME:  Luis Hunter, MRN:  [id number], DOB:  1975-08-19, LOS: 2 ADMISSION DATE:  12/16/2020, CONSULTATION DATE:  8/13 REFERRING MD:  Starleen Blue EDP [facility], CHIEF COMPLAINT:  Seizure   History of Present Illness:  45 y/o male found unresponsive after having multiple seizures.  Intubated for airway protection and loadded with Keppra.  UDS positive for cocaine and THC.  Transferred to [facility] to [facility] for cont EEG.  Since admission he has had a LHC for an abnormal EKG, but this was felt to be due to severe LVH.  He has been treated with antibiotics for pneumonia.  He has had persistent fevers for days.   Pertinent  Medical History  Schizophrenia Cocaine abuse Bipolar disorder Depression  Significant Hospital Events: Including procedures, antibiotic start and stop dates in addition to other pertinent events   8/13 intubated, transferred to [facility] 8/20 vent dyssynchrony >> add precedex; EEG with periodic triphasic morphology and generalized slowing 8/23 remains encephalopathic, required escalation of sedatives for vent dyssynchrony  8/28 goals of care conversation: plan tracheostomy  8/29 dilantin stopped by neurology, tracheostomy 8/31 followed commands  Tubes/lines: 8/13 ETT > 8/29 8/29 tracheostomy >   Antibiotics 8/16 started on unasyn 8/17 unasyn discontinued 8/18 started on zosyn 8/25 started augmentin for sinusitis 8/28 Meropenem  Cultures 8/16 respiratory aspirate with gram positive cocci, few gram negative rods 8/26 resp culture > serratia   Procedures/imaging: 8/22 cardiac catheterization for STEMI-no significant coronary artery disease discovered 8/24 MRI-mastoid fullness, no intracerebral abnormality 8/27 on cEEG 8/29 RUQ ultrasound > hepatic hemangioma  Interim History / Subjective:   Tachypnea on vent but normal minute ventilation No other acute issues    Objective   Blood pressure (!) 150/103, pulse 99, temperature 100 F (37.8 C), temperature source Oral, resp.  rate (!) 26, height '5\' 11"'$  (1.803 m), weight 84.6 kg, SpO2 97 %.    Vent Mode: PRVC FiO2 (%):  [40 %] 40 % Set Rate:  [12 bmp] 12 bmp Vt Set:  [600 mL] 600 mL PEEP:  [8 cmH20] 8 cmH20 Pressure Support:  [8 cmH20] 8 cmH20 Plateau Pressure:  [16 cmH20-23 cmH20] 18 cmH20   Intake/Output Summary (Last 24 hours) at 01/03/2021 0757 Last data filed at 01/03/2021 0700 Gross per 24 hour  Intake 2365.75 ml  Output 2596 ml  Net -230.25 ml   Filed Weights   01/01/21 0500 01/02/21 0416 01/03/21 0420  Weight: 80.6 kg 84.9 kg 84.6 kg    Examination:  General:  In bed on vent HENT: NCAT Tracheostomy in place PULM: CTA B, vent supported breathing CV: RRR, no mgr GI: BS+, soft, nontender MSK: normal bulk and tone Neuro: awake, tracks with eye, raised R thumb on command    Resolved Hospital Problem list     Assessment & Plan:   Acute metabolic encephalopathy : unclear cause, initially felt to be post ictal state, now concern for multi-factorial metabolic encephalopathy and baseline drug use and underlying mental health disorder Minimize sedation Stop precedex Use fentanyl prn  Acute hypoxemic respiratory failure > tachypnea on 8/31 with weaning sedation, but otherwise stable; suspect withdrawal syndrome contributing to symptoms Serratia pneumonia Right lower lobe Sinusitis Asthma, presumed Meropenem 7 days Continue brovana/pulmicort for now VAP prevention Daily WUA/SBT Trach care per routine Tolerate tachypnea, only use sedation if accessory muscle use, paradoxical breathing or hypoxemia  Epilepsy Continue vimpat, keppra  Hypernatremia > improved Continue free water  Hypertension Start hctz amlodipine  Poly substance abuse Depression Schizophrenia Bipolar disorder D/c  precedex today Use fentanyl prn Hold any other sedatives  Elevated ammonia with abnormal LFT, unclear etiology Transaminitis of uncertain etiology > stable Liver hemangioma Will need outpatient  repeat imaging of hepatic hemangioma Repeat LFT in AM  Constipation > resolved Miralax prn  Hypertension LVH metoprolol  Best Practice (right click and "Reselect all SmartList Selections" daily)   Diet/type: tubefeeds DVT prophylaxis: LMWH GI prophylaxis: PPI Lines: N/A Foley:  N/A Code Status:  full code Last date of multidisciplinary goals of care discussion [8/28]  Labs   CBC: Recent Labs  Lab 12/28/20 0149 12/28/20 0940 12/30/20 0047 12/30/20 0824 12/30/20 1512 12/31/20 0049 12/31/20 2242 01/02/21 0133 01/03/21 0043  WBC 21.9*  --  23.1*  --   --  16.4*  --  13.7* 15.2*  NEUTROABS  --   --   --   --   --   --   --  9.9* 12.9*  HGB 9.7*   < > 10.0*   < > 9.9* 8.5* 9.2* 8.4* 8.5*  HCT 29.4*   < > 31.0*   < > 29.0* 26.6* 27.0* 26.6* 26.0*  MCV 87.2  --  89.9  --   --  91.4  --  91.1 88.4  PLT 287  --  404*  --   --  387  --  452* 425*   < > = values in this interval not displayed.    Basic Metabolic Panel: Recent Labs  Lab 12/28/20 0149 12/28/20 0940 12/28/20 1032 12/28/20 1638 12/29/20 0629 12/30/20 0047 12/30/20 [id number] 12/31/20 0049 12/31/20 2242 01/01/21 0654 01/02/21 0133 01/03/21 0043  NA 142   < >  --   --  143 143   < > 145 146* 144 143 140  K 4.4   < >  --   --  4.6 4.9   < > 5.2* 4.4 4.4 4.3 3.7  CL 110  --   --   --  111 111  --  110  --  107 108 107  CO2 25  --   --   --  26 27  --  30  --  '31 29 28  '$ GLUCOSE 105*  --   --   --  149* 128*  --  122*  --  112* 128* 138*  BUN 32*  --   --   --  30* 27*  --  34*  --  30* 26* 19  CREATININE 0.75  --   --   --  0.77 0.69  --  0.80  --  0.64 0.67 0.56*  CALCIUM 8.1*  --   --   --  8.0* 8.1*  --  8.1*  --  8.2* 8.1* 7.8*  MG 1.8  --  2.0 2.1 2.1  --   --  2.3  --   --   --   --   PHOS 2.4*  --  3.5 2.9 3.0  --   --   --   --   --   --   --    < > = values in this interval not displayed.   GFR: Estimated Creatinine Clearance: 122.9 mL/min (A) (by C-G formula based on SCr of 0.56 mg/dL  (L)). Recent Labs  Lab 12/28/20 0149 12/29/20 0629 12/30/20 0047 12/31/20 0049 01/02/21 0133 01/03/21 0043  PROCALCITON 0.83 0.80 0.65  --   --   --   WBC 21.9*  --  23.1* 16.4* 13.7* 15.2*  Liver Function Tests: Recent Labs  Lab 12/29/20 0629 12/30/20 0047 12/31/20 0049 01/02/21 0133 01/03/21 0043  AST 301* 221* 285* 390* 315*  ALT 311* 315* 340* 447* 485*  ALKPHOS 105 130* 130* 140* 144*  BILITOT 1.2 1.2 1.3* 0.9 0.7  PROT 5.9* 6.3* 5.9* 5.7* 5.9*  ALBUMIN 1.6* 1.7* 1.5* 1.4* 1.5*   No results for input(s): LIPASE, AMYLASE in the last 168 hours. Recent Labs  Lab 12/27/20 0830 12/31/20 1046 01/01/21 0654  AMMONIA 51* 63* 36*    ABG    Component Value Date/Time   PHART 7.366 12/31/2020 2242   PCO2ART 59.2 (H) 12/31/2020 2242   PO2ART 100 12/31/2020 2242   HCO3 33.8 (H) 12/31/2020 2242   TCO2 36 (H) 12/31/2020 2242   ACIDBASEDEF 1.0 12/21/2020 1101   O2SAT 97.0 12/31/2020 2242     Coagulation Profile: No results for input(s): INR, PROTIME in the last 168 hours.  Cardiac Enzymes: No results for input(s): CKTOTAL, CKMB, CKMBINDEX, TROPONINI in the last 168 hours.  HbA1C: Hgb A1c MFr Bld  Date/Time Value Ref Range Status  11/20/2019 12:50 AM 5.8 (H) 4.8 - 5.6 % Final    Comment:    (NOTE) Pre diabetes:          5.7%-6.4%  Diabetes:              >6.4%  Glycemic control for   <7.0% adults with diabetes     CBG: Recent Labs  Lab 01/02/21 1529 01/02/21 1937 01/02/21 2301 01/03/21 0310 01/03/21 0726  GLUCAP 137* 120* 119* 123* 127*    Critical care time: 35 minutes    [name], MD [place] P[facility]M Pager: [phone] Cell: [phone] After 7:00 pm call Elink  [phone]

## 2021-01-03 NOTE — Progress Notes (Signed)
RT note. Patient transported to CT and back to rm Luis Hunter on vent without any complications.

## 2021-01-03 NOTE — Progress Notes (Signed)
Nutrition Follow-up  DOCUMENTATION CODES:   Not applicable  INTERVENTION:   Continue TF via post pyloric Cortrak: Vital 1.5 at 65 ml/h (1560 ml per day) Prosource TF 45 ml TID  Provides 2460 kcal, 138 gm protein, 1192 ml free water daily.  Free water flushes 100 ml q 8 h for a total of 1492 ml per day.  NUTRITION DIAGNOSIS:   Inadequate oral intake related to inability to eat as evidenced by NPO status.  Ongoing   GOAL:   Patient will meet greater than or equal to 90% of their needs  Met with TF  MONITOR:   Vent status, Labs, Weight trends, TF tolerance, Skin, I & O's  REASON FOR ASSESSMENT:   Consult, Ventilator Enteral/tube feeding initiation and management  ASSESSMENT:   45 year old man with prior history of seizures and substance abuse found on the floor unresponsive by his wife.  Multiple witnessed seizures by wife and EMS, received 8 mg of IV Versed in the field and continued to seize on arrival to the ED, received 4 mg of IV Ativan, intubated for airway protection and started on propofol drip.  Loaded with Keppra.  Versed drip was added since he continued to have twitching on maximum dose of propofol.  Head CT was negative.  UDS positive for cocaine and THC  Discussed patient in ICU rounds and with RN today. Precedex stopped this morning. S/P tracheostomy 8/29. Cortrak in place, tip is post-pyloric in the distal duodenum. Tolerating TF at goal meeting 100% of estimated nutrition needs: Vital 1.5 at 65 ml/h with Prosource TF 45 ml via tube TID  Patient remains on ventilator support via trach. MV: 17.2 L/min Temp (24hrs), Avg:99.8 F (37.7 C), Min:99 F (37.2 C), Max:100.5 F (38.1 C)   Labs reviewed.  CBG: [id number]  Medications reviewed and include colace, HCTZ, keppra, protonix, senokot.  Admission weight 81.3 kg Current weight 84.6 kg I/O +15.7 L since admission UOP 2145 ml x 24 hours Stool output 450 ml x 24 hours via rectal tube  Diet  Order:   Diet Order     None       EDUCATION NEEDS:   Not appropriate for education at this time  Skin:  Skin Assessment: Skin Integrity Issues: Skin Integrity Issues:: Other (Comment) Other: skin tear L buttocks, MASD to coccyx  Last BM:  8/31 rectal tube  Height:   Ht Readings from Last 1 Encounters:  01/01/21 '5\' 11"'  (1.803 m)    Weight:   Wt Readings from Last 1 Encounters:  01/03/21 84.6 kg    Ideal Body Weight:  78.2 kg  BMI:  Body mass index is 26.01 kg/m.  Estimated Nutritional Needs:   Kcal:  2450  Protein:  120-140 gm  Fluid:  >/= 2.4 L    Lucas Mallow, RD, LDN, CNSC Please refer to Amion for contact information.

## 2021-01-03 NOTE — Progress Notes (Signed)
Pt placed back on full vent support due to increased RR into the 30s, increased WOB, minute ventilation > 16.0, RN aware, RT will continue to monitor.

## 2021-01-03 NOTE — Progress Notes (Signed)
eLink Physician-Brief Progress Note Patient Name: JARYAN MCKINZIE DOB: 01/22/1976 MRN: [id number]   Date of Service  01/03/2021  HPI/Events of Note  Patient with new onset anisocoria (right 2 mm, left 4 mm) rest of neuro exam is non-focal per bedside RN. No other explanation for anisocoria.  eICU Interventions  Non-contrast CT brain ordered to r/o ischemic stroke vs ICH.        Kerry Kass Kenadee Gates 01/03/2021, 9:10 PM

## 2021-01-03 NOTE — Evaluation (Signed)
Physical Therapy Evaluation Patient Details Name: Luis Hunter MRN: [id number] DOB: 1976-05-04 Today's Date: 01/03/2021   History of Present Illness  pt is a 45 y/o male admitted 8/13 after being found on the floor unresponsive by his wife with witnessed seizures by both wife and EMS.  Intubated on admission, s/p trach 8/29.  PMHx:  Schizophrenia, Cocaine abuse, Bipolar d/o,  Clinical Impression  Pt admitted with/for unresponsiveness/witnessed seizure.  Pt not able to participate in therapy at this point and needing total assist.  Pt currently limited functionally due to the problems listed. ( See problems list.)   Pt will benefit from PT to maximize function and safety in order to get ready for next venue listed below.     Follow Up Recommendations SNF;LTACH;Supervision/Assistance - 24 hour    Equipment Recommendations  Other (comment) (TBA as status changes)    Recommendations for Other Services       Precautions / Restrictions Precautions Precautions: Fall      Mobility  Bed Mobility Overal bed mobility: Needs Assistance Bed Mobility: Sit to Supine;Supine to Sit     Supine to sit: Total assist Sit to supine: Total assist;+2 for physical assistance   General bed mobility comments: no response/righting reaction to transitionto sitting in midline.    Transfers                 General transfer comment: not safe  Ambulation/Gait                Stairs            Wheelchair Mobility    Modified Rankin (Stroke Patients Only)       Balance Overall balance assessment: Needs assistance Sitting-balance support: No upper extremity supported Sitting balance-Leahy Scale: Zero Sitting balance - Comments: no righting reaction                                     Pertinent Vitals/Pain Pain Assessment: Faces Faces Pain Scale: No hurt Pain Intervention(s): Monitored during session    Home Living Family/patient expects to be discharged  to:: Unsure                 Additional Comments: no family present to ask about home environment or PLOF    Prior Function           Comments: no family present, pt unable to participate     Hand Dominance        Extremity/Trunk Assessment   Upper Extremity Assessment Upper Extremity Assessment:  (no spontaneous/voluntary movement noted or any resistance to movement/stretch.)    Lower Extremity Assessment Lower Extremity Assessment:  (no spontaneous/voluntary movement noted or any resistance to movement/stretch.)       Communication   Communication: Tracheostomy  Cognition Arousal/Alertness: Awake/alert (but not responsive) Behavior During Therapy: Flat affect Overall Cognitive Status: Impaired/Different from baseline (limited responsiveness to input.)                                        General Comments General comments (skin integrity, edema, etc.): vss on vent support.  Pt unable to manage secretions.    Exercises Other Exercises Other Exercises: basic UE and LE PROM without heelcord stretch.   Assessment/Plan    PT Assessment Patient needs continued PT services  PT Problem  List Decreased strength;Decreased activity tolerance;Decreased balance;Decreased mobility;Decreased coordination;Cardiopulmonary status limiting activity       PT Treatment Interventions Functional mobility training;Therapeutic activities;Therapeutic exercise;Balance training;Patient/family education;Neuromuscular re-education    PT Goals (Current goals can be found in the Care Plan section)  Acute Rehab PT Goals PT Goal Formulation: Patient unable to participate in goal setting Time For Goal Achievement: 01/17/21 Potential to Achieve Goals: Fair    Frequency Min 2X/week   Barriers to discharge        Co-evaluation               AM-PAC PT "6 Clicks" Mobility  Outcome Measure Help needed turning from your back to your side while in a flat bed  without using bedrails?: Total Help needed moving from lying on your back to sitting on the side of a flat bed without using bedrails?: Total Help needed moving to and from a bed to a chair (including a wheelchair)?: Total Help needed standing up from a chair using your arms (e.g., wheelchair or bedside chair)?: Total Help needed to walk in hospital room?: Total Help needed climbing 3-5 steps with a railing? : Total 6 Click Score: 6    End of Session Equipment Utilized During Treatment: Oxygen Activity Tolerance: Patient tolerated treatment well Patient left: in bed;with call bell/phone within reach;with bed alarm set Nurse Communication: Mobility status;Need for lift equipment PT Visit Diagnosis: Muscle weakness (generalized) (M62.81);Other symptoms and signs involving the nervous system (R29.898)    Time: 1240-1301 PT Time Calculation (min) (ACUTE ONLY): 21 min   Charges:   PT Evaluation $PT Eval Moderate Complexity: 1 Mod          01/03/2021  Ginger Carne., PT Acute Rehabilitation Services [phone]  (pager) [phone]  (office)  Tessie Fass Reizy Dunlow 01/03/2021, 2:17 PM

## 2021-01-04 ENCOUNTER — Inpatient Hospital Stay (HOSPITAL_COMMUNITY): Payer: Medicare HMO

## 2021-01-04 DIAGNOSIS — G9340 Encephalopathy, unspecified: Secondary | ICD-10-CM | POA: Diagnosis not present

## 2021-01-04 DIAGNOSIS — J9602 Acute respiratory failure with hypercapnia: Secondary | ICD-10-CM | POA: Diagnosis not present

## 2021-01-04 LAB — COMPREHENSIVE METABOLIC PANEL
ALT: 477 U/L — ABNORMAL HIGH (ref 0–44)
AST: 256 U/L — ABNORMAL HIGH (ref 15–41)
Albumin: 1.6 g/dL — ABNORMAL LOW (ref 3.5–5.0)
Alkaline Phosphatase: 148 U/L — ABNORMAL HIGH (ref 38–126)
Anion gap: 9 (ref 5–15)
BUN: 16 mg/dL (ref 6–20)
CO2: 27 mmol/L (ref 22–32)
Calcium: 7.9 mg/dL — ABNORMAL LOW (ref 8.9–10.3)
Chloride: 102 mmol/L (ref 98–111)
Creatinine, Ser: 0.56 mg/dL — ABNORMAL LOW (ref 0.61–1.24)
GFR, Estimated: >60 mL/min (ref >60)
Glucose, Bld: 136 mg/dL — ABNORMAL HIGH (ref 70–99)
Potassium: 3.1 mmol/L — ABNORMAL LOW (ref 3.5–5.1)
Sodium: 138 mmol/L (ref 135–145)
Total Bilirubin: 1.1 mg/dL (ref 0.3–1.2)
Total Protein: 6.4 g/dL — ABNORMAL LOW (ref 6.5–8.1)

## 2021-01-04 LAB — CBC WITH DIFFERENTIAL/PLATELET
Abs Immature Granulocytes: 0.8 10*3/uL — ABNORMAL HIGH (ref 0.00–0.07)
Basophils Absolute: 0 10*3/uL (ref 0.0–0.1)
Basophils Relative: 0 %
Eosinophils Absolute: 0.8 10*3/uL — ABNORMAL HIGH (ref 0.0–0.5)
Eosinophils Relative: 4 %
HCT: 26.6 % — ABNORMAL LOW (ref 39.0–52.0)
Hemoglobin: 9 g/dL — ABNORMAL LOW (ref 13.0–17.0)
Lymphocytes Relative: 6 %
Lymphs Abs: 1.1 10*3/uL (ref 0.7–4.0)
MCH: 29.3 pg (ref 26.0–34.0)
MCHC: 33.8 g/dL (ref 30.0–36.0)
MCV: 86.6 fL (ref 80.0–100.0)
Metamyelocytes Relative: 2 %
Monocytes Absolute: 0.8 10*3/uL (ref 0.1–1.0)
Monocytes Relative: 4 %
Myelocytes: 2 %
Neutro Abs: 15.6 10*3/uL — ABNORMAL HIGH (ref 1.7–7.7)
Neutrophils Relative %: 82 %
Platelets: 430 10*3/uL — ABNORMAL HIGH (ref 150–400)
RBC: 3.07 MIL/uL — ABNORMAL LOW (ref 4.22–5.81)
RDW: 14.8 % (ref 11.5–15.5)
WBC: 19 10*3/uL — ABNORMAL HIGH (ref 4.0–10.5)
nRBC: 0 /100 WBC
nRBC: 0.3 % — ABNORMAL HIGH (ref 0.0–0.2)

## 2021-01-04 LAB — GLUCOSE, CAPILLARY
Glucose-Capillary: 112 mg/dL — ABNORMAL HIGH (ref 70–99)
Glucose-Capillary: 121 mg/dL — ABNORMAL HIGH (ref 70–99)
Glucose-Capillary: 130 mg/dL — ABNORMAL HIGH (ref 70–99)
Glucose-Capillary: 105 mg/dL — ABNORMAL HIGH (ref 70–99)
Glucose-Capillary: 114 mg/dL — ABNORMAL HIGH (ref 70–99)

## 2021-01-04 IMAGING — DX DG CHEST 1V PORT
2 series · 2 of 2 positions shown · non-contrast
Comparison: [DATE]

CLINICAL DATA: Follow-up CHF

EXAM:
PORTABLE CHEST 1 VIEW

[chest ap (1 of 2)]
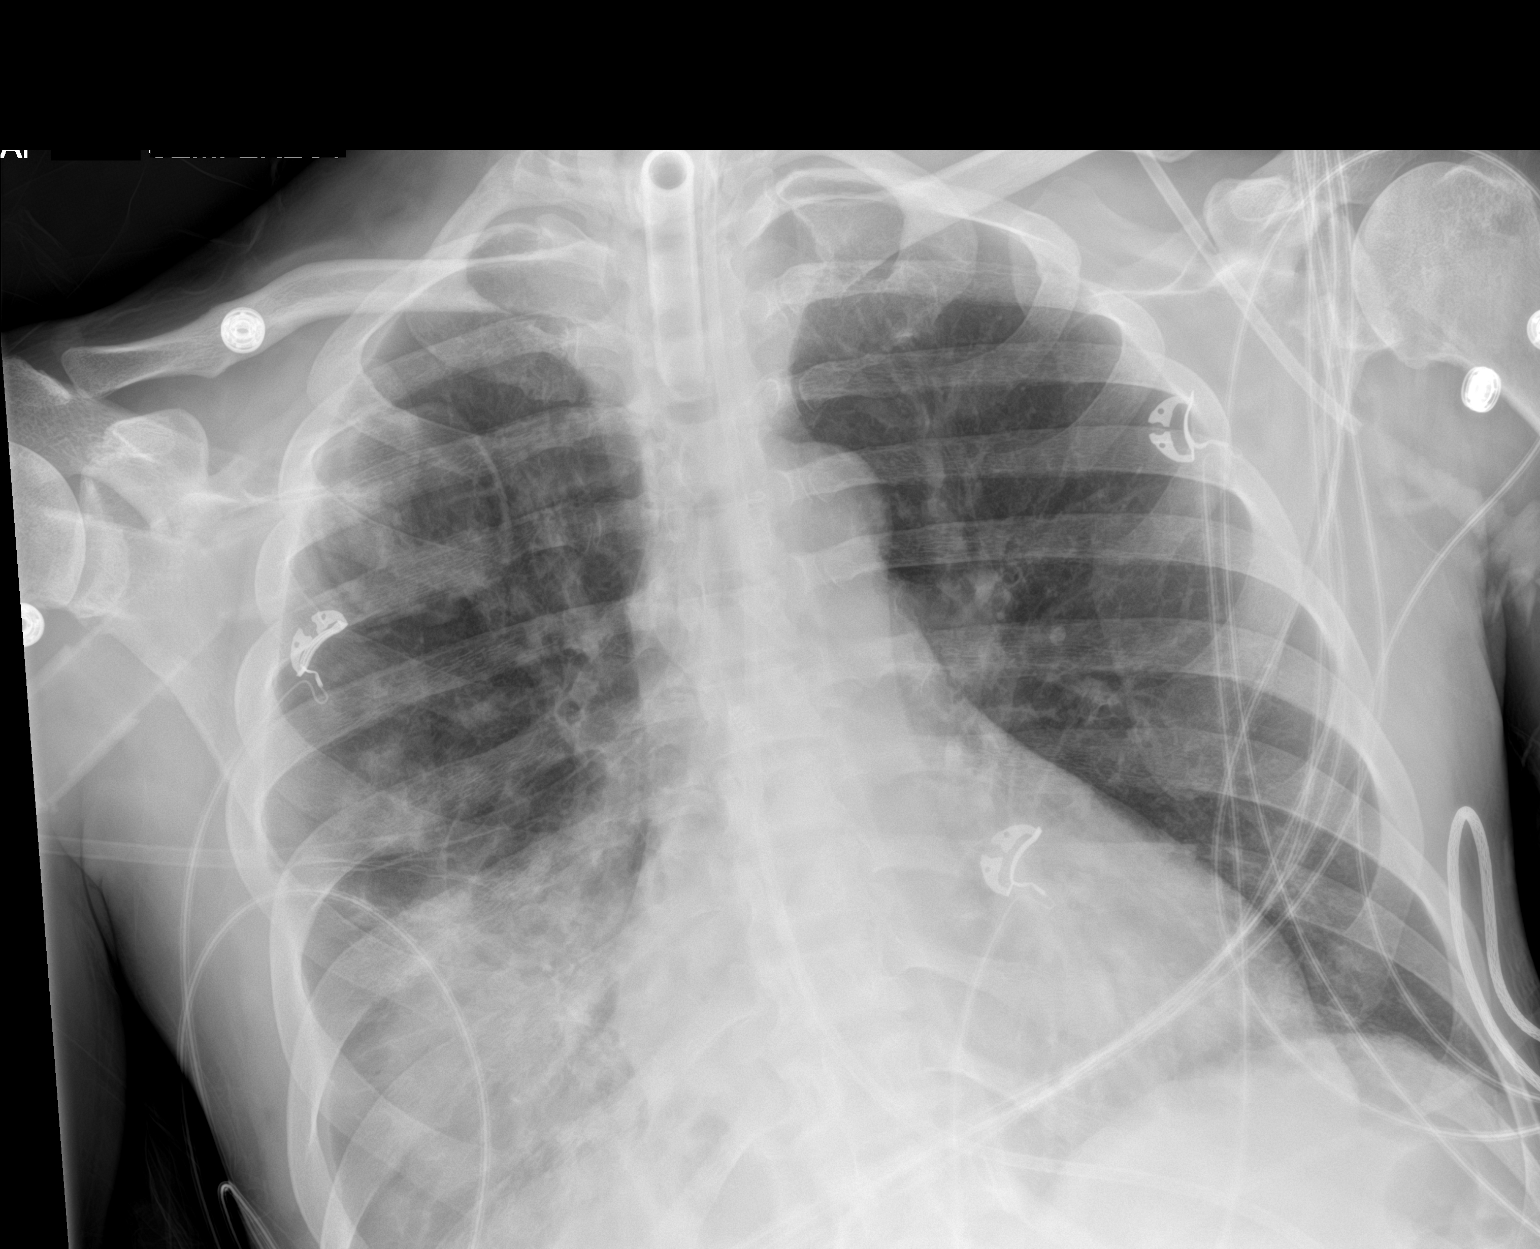

[chest ap (2 of 2)]
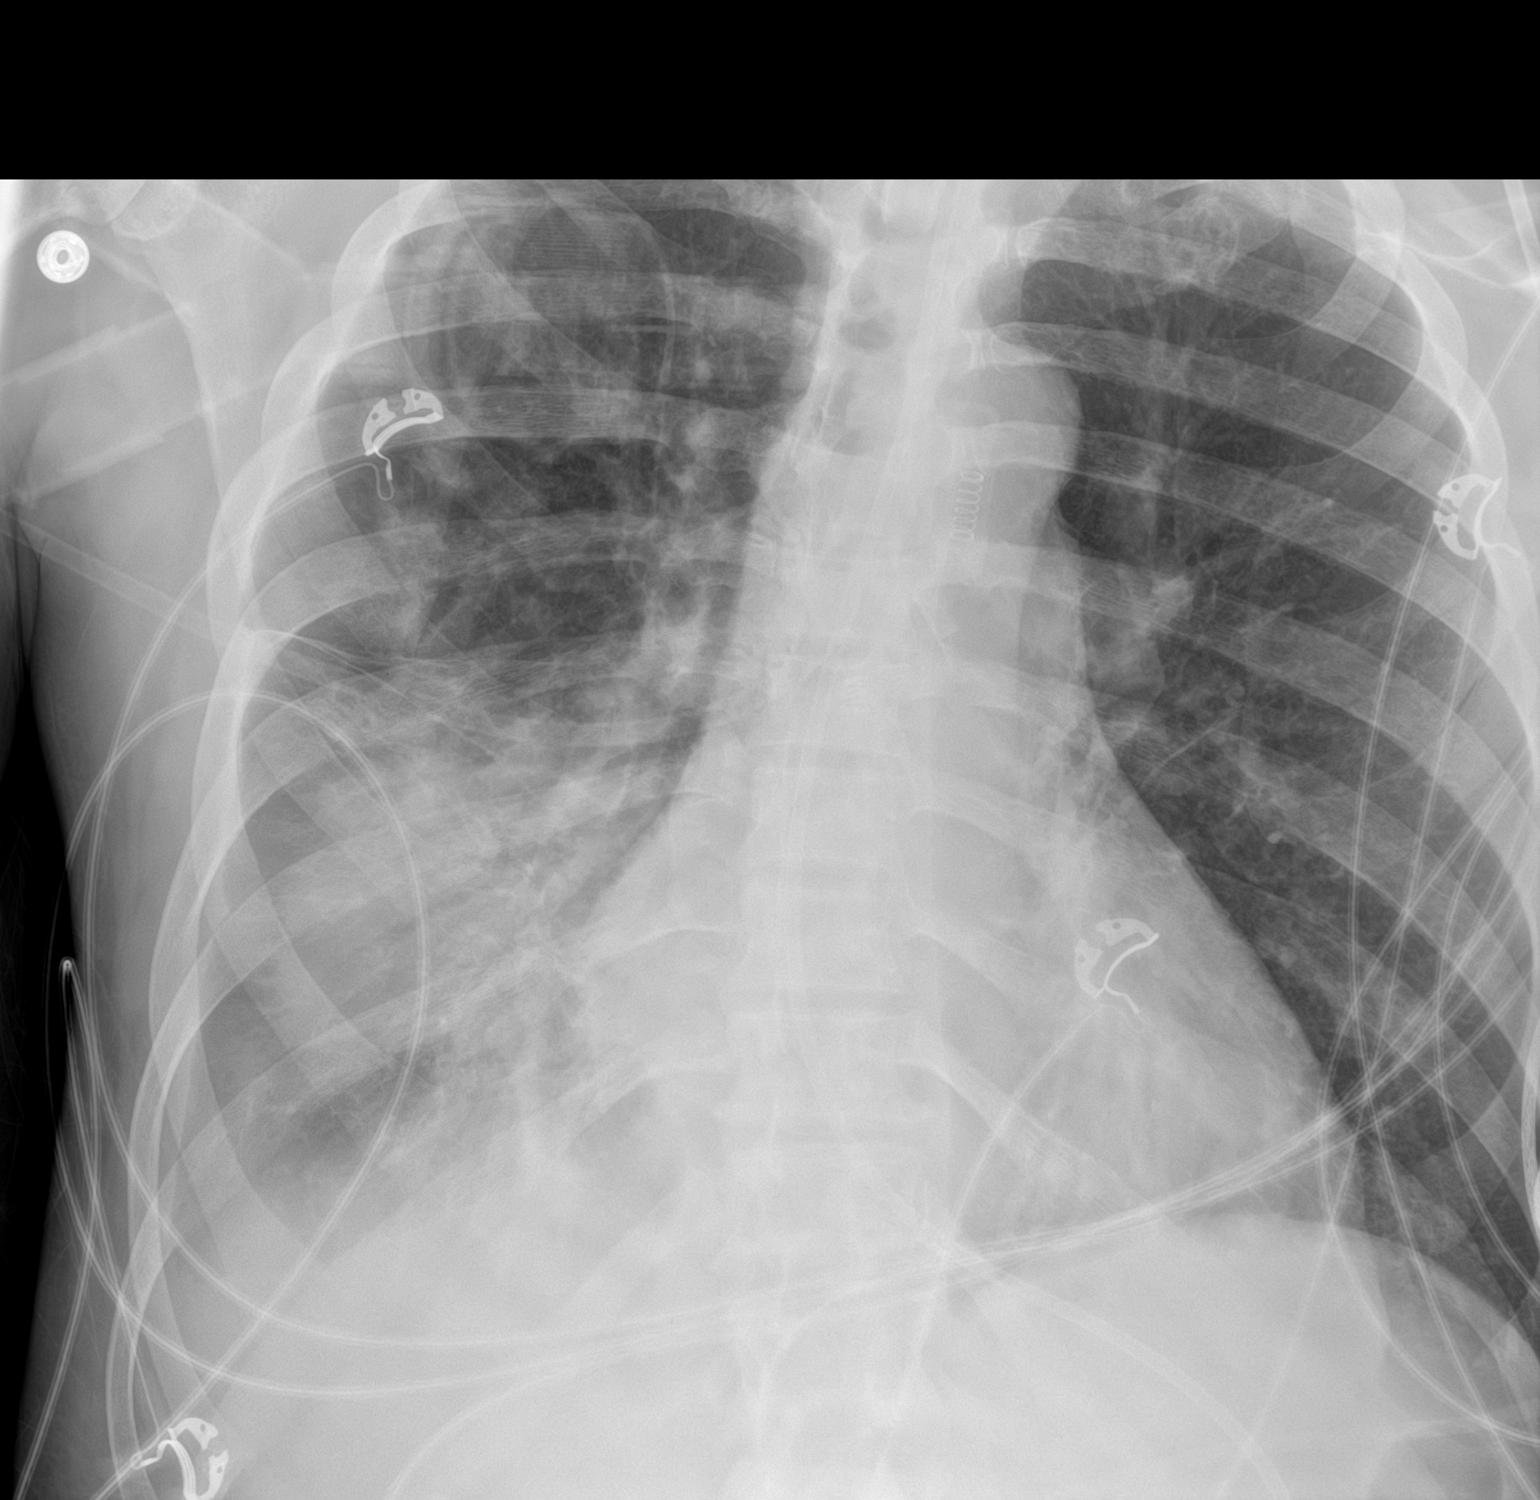

[2 of 2 positions shown; findings below may reference images not displayed]

FINDINGS: Cardiomegaly. Tracheostomy. Unchanged layering right pleural
effusion associated atelectasis or consolidation. The left lung is
normally aerated. Partially imaged enteric feeding tube. The
visualized skeletal structures are unremarkable.
IMPRESSION: 1.  Tracheostomy.

2. Unchanged layering right pleural effusion associated atelectasis
or consolidation. The left lung is normally aerated.

3.  Cardiomegaly.

## 2021-01-04 MED ORDER — POTASSIUM CHLORIDE 10 MEQ/100ML IV SOLN
10.0000 meq | INTRAVENOUS | Status: AC
  Administered 2021-01-04 (×4): 10 meq via INTRAVENOUS
  Filled 2021-01-04 (×4): qty 100

## 2021-01-04 MED ORDER — SODIUM CHLORIDE 3 % IN NEBU
4.0000 mL | INHALATION_SOLUTION | Freq: Two times a day (BID) | RESPIRATORY_TRACT | Status: AC
  Administered 2021-01-04 – 2021-01-07 (×6): 4 mL via RESPIRATORY_TRACT
  Filled 2021-01-04 (×7): qty 4

## 2021-01-04 MED ORDER — GUAIFENESIN 100 MG/5ML PO SOLN
10.0000 mL | Freq: Four times a day (QID) | ORAL | Status: DC
Start: 2021-01-04 — End: 2021-01-04

## 2021-01-04 MED ORDER — POTASSIUM CHLORIDE 20 MEQ PO PACK
20.0000 meq | PACK | ORAL | Status: AC
  Administered 2021-01-04 (×2): 20 meq
  Filled 2021-01-04 (×2): qty 1

## 2021-01-04 MED ORDER — METOPROLOL TARTRATE 5 MG/5ML IV SOLN
INTRAVENOUS | Status: AC
  Administered 2021-01-04: 5 mg
  Filled 2021-01-04: qty 5

## 2021-01-04 MED ORDER — DEXMEDETOMIDINE HCL IN NACL 400 MCG/100ML IV SOLN
0.0000 ug/kg/h | INTRAVENOUS | Status: DC
  Administered 2021-01-04: 0.4 ug/kg/h via INTRAVENOUS
  Administered 2021-01-05 (×2): 0.5 ug/kg/h via INTRAVENOUS
  Administered 2021-01-06: 0.3 ug/kg/h via INTRAVENOUS
  Administered 2021-01-06: 0.5 ug/kg/h via INTRAVENOUS
  Filled 2021-01-04 (×4): qty 100

## 2021-01-04 MED ORDER — GUAIFENESIN 100 MG/5ML PO SOLN
10.0000 mL | Freq: Four times a day (QID) | ORAL | Status: AC
Start: 2021-01-04 — End: 2021-01-07
  Administered 2021-01-04 – 2021-01-07 (×12): 200 mg
  Filled 2021-01-04: qty 10
  Filled 2021-01-04 (×2): qty 5
  Filled 2021-01-04 (×3): qty 10
  Filled 2021-01-04: qty 5
  Filled 2021-01-04 (×4): qty 10
  Filled 2021-01-04: qty 5

## 2021-01-04 NOTE — Progress Notes (Signed)
NAME:  Luis Hunter, MRN:  [id number], DOB:  June 06, 1975, LOS: 26 ADMISSION DATE:  12/16/2020, CONSULTATION DATE:  8/13 REFERRING MD:  Starleen Blue EDP [facility], CHIEF COMPLAINT:  Seizure   History of Present Illness:  45 y/o male found unresponsive after having multiple seizures.  Intubated for airway protection and loadded with Keppra.  UDS positive for cocaine and THC.  Transferred to [facility] to [facility] for cont EEG.  Since admission he has had a LHC for an abnormal EKG, but this was felt to be due to severe LVH.  He has been treated with antibiotics for pneumonia.  He has had persistent fevers for days.   Pertinent  Medical History  Schizophrenia Cocaine abuse Bipolar disorder Depression  Significant Hospital Events: Including procedures, antibiotic start and stop dates in addition to other pertinent events   8/13 intubated, transferred to [facility] 8/20 vent dyssynchrony >> add precedex; EEG with periodic triphasic morphology and generalized slowing 8/23 remains encephalopathic, required escalation of sedatives for vent dyssynchrony  8/28 goals of care conversation: plan tracheostomy  8/29 dilantin stopped by neurology, tracheostomy 8/31 followed commands, stopped precedex  Tubes/lines: 8/13 ETT > 8/29 8/29 tracheostomy >   Antibiotics 8/16 started on unasyn 8/17 unasyn discontinued 8/18 started on zosyn 8/25 started augmentin for sinusitis 8/28 Meropenem  Cultures 8/16 respiratory aspirate with gram positive cocci, few gram negative rods 8/26 resp culture > serratia   Procedures/imaging: 8/22 cardiac catheterization for STEMI-no significant coronary artery disease discovered 8/24 MRI-mastoid fullness, no intracerebral abnormality 8/27 on cEEG 8/29 RUQ ultrasound > hepatic hemangioma 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 rate (!) 24, height '5\' 11"'$  (1.803 m), weight 77.6 kg, SpO2 99 %.    Vent Mode: PSV;CPAP FiO2 (%):  [40 %] 40 % Set Rate:  [12 bmp] 12 bmp Vt Set:  [600 mL] 600 mL PEEP:  [8 cmH20] 8 cmH20 Pressure Support:  [10 [id number] cmH20] 10 cmH20 Plateau Pressure:  [15 cmH20-17 cmH20] 17 cmH20   Intake/Output Summary (Last 24 hours) at 01/04/2021 1020 Last data filed at 01/04/2021 0800 Gross per 24 hour  Intake 1979.58 ml  Output 2301 ml  Net -321.42 ml   Filed Weights   01/02/21 0416 01/03/21 0420 01/04/21 0327  Weight: 84.9 kg 84.6 kg 77.6 kg    Examination:  General:  In bed on vent HENT: NCAT tracheostomy in place PULM: CTA B, vent supported breathing CV: RRR, no mgr GI: BS+, soft, nontender MSK: normal bulk and tone Neuro: sedated on vent   Resolved Hospital Problem list     Assessment & Plan:    Acute metabolic encephalopathy : see prior notes for discussion, slowly improving Minimize sedation  Frequent orientation  Acute hypoxemic respiratory failure > improving but CXR with dense consolidation, no mucus plug on 8/29 bronchoscopy Serratia pneumonia Right lower lobe Sinusitis Asthma, presumed Meropenem 7 days, however if the WBC is still rising on 9/2 would consider CT chest  Continue brovana/pulmicort for now VAP prevention Pressure support as long as possible Tracheostomy care per routine Pulmonary toilette, start 9/1 hypertonic saline, chest PT  Epilepsy Vimpat, keppra  Hypernatremia > improved Continue free water  Hypertension Hctz  amlodipine  Poly substance abuse Depression Schizophrenia Bipolar disorder  Fentanyl prn Hold other sedatives  Elevated ammonia with abnormal LFT, unclear etiology Transaminitis of uncertain etiology > stable Liver hemangioma Will need outpatient repeat imaging of hepatic hemangioma Repeat LFT in AM  Constipation  > resolved Miralax prn  Hypertension LVH Metoprolol  Best Practice (right click and "Reselect all SmartList Selections" daily)   Diet/type: tubefeeds DVT prophylaxis: LMWH GI prophylaxis: PPI Lines: N/A Foley:  N/A Code Status:  full code Last date of multidisciplinary goals of care discussion [8/28]  Labs   CBC: Recent Labs  Lab 12/30/20 0047 12/30/20 0824 12/31/20 0049 12/31/20 2242 01/02/21 0133 01/03/21 0043 01/04/21 0217  WBC 23.1*  --  16.4*  --  13.7* 15.2* 19.0*  NEUTROABS  --   --   --   --  9.9* 12.9* 15.6*  HGB 10.0*   < > 8.5* 9.2* 8.4* 8.5* 9.0*  HCT 31.0*   < > 26.6* 27.0* 26.6* 26.0* 26.6*  MCV 89.9  --  91.4  --  91.1 88.4 86.6  PLT 404*  --  387  --  452* 425* 430*   < > = values in this interval not displayed.    Basic Metabolic Panel: Recent Labs  Lab 12/28/20 1032 12/28/20 1638 12/29/20 [id number] 12/30/20 0047 12/31/20 0049 12/31/20 2242 01/01/21 0654 01/02/21 0133 01/03/21 0043 01/04/21 0217  NA  --   --  143   < > 145 146* 144 143 140 138  K  --   --  4.6   < > 5.2* 4.4 4.4 4.3 3.7 3.1*  CL  --   --  111   < > 110  --  107 108 107 102  CO2  --   --  26   < > 30  --  '31 29 28 27  '$ GLUCOSE  --   --  149*   < > 122*  --  112* 128* 138* 136*  BUN  --   --  30*   < > 34*  --  30* 26* 19 16  CREATININE  --   --  0.77   < > 0.80  --  0.64 0.67 0.56* 0.56*  CALCIUM  --   --  8.0*   < > 8.1*  --  8.2* 8.1* 7.8* 7.9*  MG 2.0 2.1 2.1  --  2.3  --   --   --   --   --   PHOS 3.5 2.9 3.0  --   --   --   --   --   --   --    < > = values in this interval not displayed.   GFR: Estimated Creatinine Clearance: 122.9 mL/min (A) (by C-G formula based on SCr of 0.56 mg/dL (L)). Recent Labs  Lab 12/29/20 0629 12/30/20 0047 12/30/20 0047 12/31/20 0049 01/02/21 0133 01/03/21 0043 01/04/21 0217  PROCALCITON 0.80 0.65  --   --   --   --   --   WBC  --  23.1*   < > 16.4* 13.7* 15.2* 19.0*   < > = values in this interval not displayed.    Liver  Function Tests: Recent Labs  Lab 12/30/20 0047 12/31/20 0049 01/02/21 0133 01/03/21 0043 01/04/21 0217  AST 221* 285* 390* 315* 256*  ALT 315* 340* 447* 485* 477*  ALKPHOS 130* 130* 140* 144* 148*  BILITOT 1.2 1.3* 0.9 0.7 1.1  PROT 6.3* 5.9* 5.7* 5.9* 6.4*  ALBUMIN 1.7* 1.5* 1.4* 1.5* 1.6*   No  results for input(s): LIPASE, AMYLASE in the last 168 hours. Recent Labs  Lab 12/31/20 1046 01/01/21 0654  AMMONIA 63* 36*    ABG    Component Value Date/Time   PHART 7.366 12/31/2020 2242   PCO2ART 59.2 (H) 12/31/2020 2242   PO2ART 100 12/31/2020 2242   HCO3 33.8 (H) 12/31/2020 2242   TCO2 36 (H) 12/31/2020 2242   ACIDBASEDEF 1.0 12/21/2020 1101   O2SAT 97.0 12/31/2020 2242     Coagulation Profile: No results for input(s): INR, PROTIME in the last 168 hours.  Cardiac Enzymes: No results for input(s): CKTOTAL, CKMB, CKMBINDEX, TROPONINI in the last 168 hours.  HbA1C: Hgb A1c MFr Bld  Date/Time Value Ref Range Status  11/20/2019 12:50 AM 5.8 (H) 4.8 - 5.6 % Final    Comment:    (NOTE) Pre diabetes:          5.7%-6.4%  Diabetes:              >6.4%  Glycemic control for   <7.0% adults with diabetes     CBG: Recent Labs  Lab 01/03/21 1544 01/03/21 1946 01/03/21 2336 01/04/21 0331 01/04/21 0745  GLUCAP 126* 102* 121* 112* 121*    Critical care time: 32 minutes    [name], MD [place] [facility] Pager: [phone] Cell: [phone] After 7:00 pm call Elink  [phone]

## 2021-01-04 NOTE — Progress Notes (Signed)
eLink Physician-Brief Progress Note Patient Name: Luis Hunter DOB: 1975-06-20 MRN: [id number]   Date of Service  01/04/2021  HPI/Events of Note  Patient with severe agitation, thrashing around in bed, associated sinus tachycardia (rate 140's), he did not settle down with 100 mcg of Fentanyl iv bolus and 1 mg of Ativan.  eICU Interventions  Precedex gtt ordered.        Frederik Pear 01/04/2021, 9:37 PM

## 2021-01-04 NOTE — Progress Notes (Signed)
SLP Cancellation Note  Patient Details Name: DEJESUS BRENA MRN: [id number] DOB: 07-04-1975   Cancelled treatment:       Reason Eval/Treat Not Completed: Patient not medically ready (Pt currently on the vent. Crystal, RN reported that weaning is planned for today, but pt currently has a significant amount of secretions. SLP will follow up on subsequent date to assess readiness unless contacted sooner by staff.)  Tobie Poet I. Hardin Negus, [place], [place] Office number [phone] Pager [phone]  Horton Marshall 01/04/2021, 11:46 AM

## 2021-01-04 NOTE — Progress Notes (Signed)
[facility] ADULT ICU REPLACEMENT PROTOCOL   The patient does apply for the [facility] Adult ICU Electrolyte Replacment Protocol based on the criteria listed below:   1.Exclusion criteria: TCTS patients, ECMO patients and Hypothermia Protocol, and   Dialysis patients 2. Is GFR >/= 30 ml/min? Yes.    Patient's GFR today is >60 3. Is SCr </= 2? No. Patient's SCr is 0.56 mg/dL 4. Did SCr increase >/= 0.5 in 24 hours? No. 5.Pt's weight >40kg  Yes.   6. Abnormal electrolyte(s): K+ 3.1  7. Electrolytes replaced per protocol 8.  Call MD STAT for K+ </= 2.5, Phos </= 1, or Mag </= 1 Physician:  n/a  Darlys Gales 01/04/2021 4:02 AM

## 2021-01-04 NOTE — Progress Notes (Signed)
[facility] evening rounds  Increased respiratory secretions today Starting to move around more purposefully Given rising WBC will check resp culture, continue meropenem  [name], MD [place] [facility] Pager: [phone] Cell: [phone] After 7:00 pm call Elink  [phone]

## 2021-01-05 ENCOUNTER — Inpatient Hospital Stay (HOSPITAL_COMMUNITY): Payer: Medicare HMO

## 2021-01-05 DIAGNOSIS — J189 Pneumonia, unspecified organism: Secondary | ICD-10-CM

## 2021-01-05 DIAGNOSIS — J9602 Acute respiratory failure with hypercapnia: Secondary | ICD-10-CM | POA: Diagnosis not present

## 2021-01-05 LAB — BASIC METABOLIC PANEL
Anion gap: 10 (ref 5–15)
BUN: 15 mg/dL (ref 6–20)
CO2: 28 mmol/L (ref 22–32)
Calcium: 8.3 mg/dL — ABNORMAL LOW (ref 8.9–10.3)
Chloride: 99 mmol/L (ref 98–111)
Creatinine, Ser: 0.78 mg/dL (ref 0.61–1.24)
GFR, Estimated: >60 mL/min (ref >60)
Glucose, Bld: 130 mg/dL — ABNORMAL HIGH (ref 70–99)
Potassium: 3.7 mmol/L (ref 3.5–5.1)
Sodium: 137 mmol/L (ref 135–145)

## 2021-01-05 LAB — GLUCOSE, CAPILLARY
Glucose-Capillary: 105 mg/dL — ABNORMAL HIGH (ref 70–99)
Glucose-Capillary: 105 mg/dL — ABNORMAL HIGH (ref 70–99)
Glucose-Capillary: 108 mg/dL — ABNORMAL HIGH (ref 70–99)
Glucose-Capillary: 110 mg/dL — ABNORMAL HIGH (ref 70–99)
Glucose-Capillary: 117 mg/dL — ABNORMAL HIGH (ref 70–99)
Glucose-Capillary: 127 mg/dL — ABNORMAL HIGH (ref 70–99)
Glucose-Capillary: 97 mg/dL (ref 70–99)

## 2021-01-05 LAB — CBC
HCT: 27.6 % — ABNORMAL LOW (ref 39.0–52.0)
Hemoglobin: 9.1 g/dL — ABNORMAL LOW (ref 13.0–17.0)
MCH: 28.6 pg (ref 26.0–34.0)
MCHC: 33 g/dL (ref 30.0–36.0)
MCV: 86.8 fL (ref 80.0–100.0)
Platelets: 401 10*3/uL — ABNORMAL HIGH (ref 150–400)
RBC: 3.18 MIL/uL — ABNORMAL LOW (ref 4.22–5.81)
RDW: 14.9 % (ref 11.5–15.5)
WBC: 17.9 10*3/uL — ABNORMAL HIGH (ref 4.0–10.5)
nRBC: 0.2 % (ref 0.0–0.2)

## 2021-01-05 IMAGING — DX DG CHEST 1V PORT
1 series · 1 of 1 positions shown · non-contrast
Comparison: Chest radiograph [DATE]

CLINICAL DATA: Acute respiratory failure

EXAM:
PORTABLE CHEST 1 VIEW

[chest]
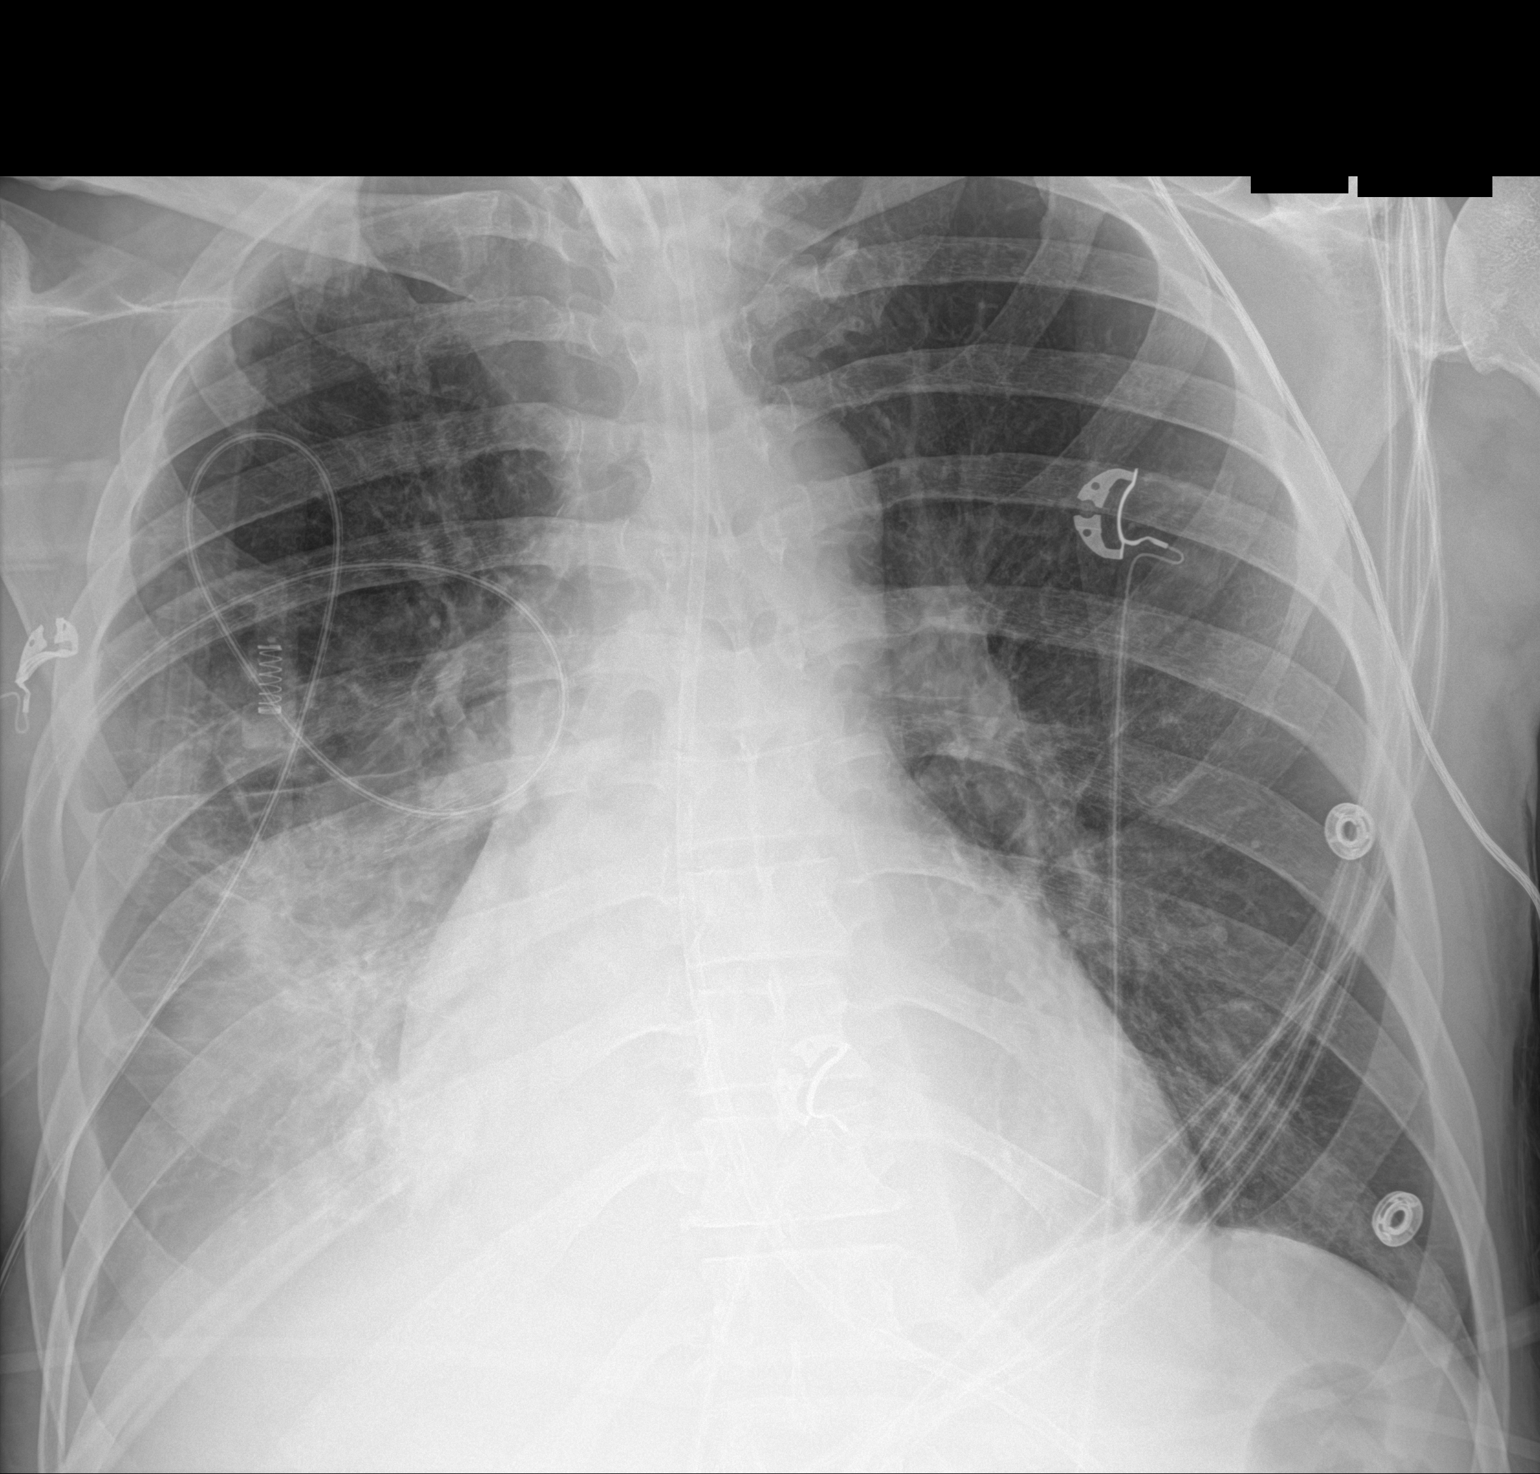

[1 of 1 positions shown; findings below may reference images not displayed]

FINDINGS: The tracheostomy tube is stable. The enteric catheter tip is off
field of view.

The heart is enlarged, unchanged. The mediastinal contours are
stable.

There is a persistent right pleural effusion with hazy opacity
throughout the right mid and lower lung. The right upper lung
remains well aerated. The left lung is clear. There is no left
pleural effusion. There is no pneumothorax.

The bones are stable.
IMPRESSION: Unchanged right pleural effusion with adjacent airspace disease.

## 2021-01-05 MED ORDER — ARIPIPRAZOLE 10 MG PO TABS
10.0000 mg | ORAL_TABLET | Freq: Every day | ORAL | Status: DC
  Administered 2021-01-05 – 2021-01-16 (×12): 10 mg
  Filled 2021-01-05 (×13): qty 1

## 2021-01-05 MED ORDER — MIRTAZAPINE 15 MG PO TABS
15.0000 mg | ORAL_TABLET | Freq: Every day | ORAL | Status: DC
  Administered 2021-01-05 – 2021-01-15 (×11): 15 mg
  Filled 2021-01-05 (×11): qty 1

## 2021-01-05 MED ORDER — CLONAZEPAM 0.5 MG PO TBDP
0.5000 mg | ORAL_TABLET | Freq: Three times a day (TID) | ORAL | Status: DC
  Administered 2021-01-05 – 2021-01-06 (×2): 0.5 mg
  Filled 2021-01-05 (×2): qty 1

## 2021-01-05 NOTE — Progress Notes (Signed)
Patient is with PT at this time. Will do CPT at next scheduled time. Patient is stable.

## 2021-01-05 NOTE — Progress Notes (Signed)
SLP Cancellation Note  Patient Details Name: Luis Hunter MRN: [id number] DOB: Jun 30, 1975   Cancelled treatment:       Reason Eval/Treat Not Completed: Patient not medically ready (Pt remains on the vent. SLP will follow up next week.)  Leetta Hendriks I. Hardin Negus, [place], [place] Office number [phone] Pager [phone]  Horton Marshall 01/05/2021, 2:56 PM

## 2021-01-05 NOTE — Progress Notes (Signed)
Physical Therapy Treatment Patient Details Name: Luis Hunter MRN: [id number] DOB: [birth year] Today's Date: 01/05/2021    History of Present Illness pt is a 45 y/o male admitted 8/13 after being found on the floor unresponsive by his wife with witnessed seizures by both wife and EMS.  Intubated on admission, s/p trach 8/29.  PMHx:  Schizophrenia, Cocaine abuse, Bipolar d/o,    PT Comments    Pt awake on arrival on vent PRVC 40% with VSS and increased secretions. Pt able to squeeze right hand on command 2/5 times and assist with quad activation on RLE on command 2/10 trials but unable to achieve any further command following or activation. Pt with limited automatic movement of bil UE and bil hip adduction. Pt with limited visual tracking and constant mild tremor of eyes and head. Will follow for progression.   HR 92 97% SPO2  Follow Up Recommendations  LTACH;Supervision/Assistance - 24 hour;SNF     Equipment Recommendations  Other (comment) (TBD)    Recommendations for Other Services       Precautions / Restrictions Precautions Precautions: Fall;Other (comment) Precaution Comments: vent, trach, cortrak, bil mittens Restrictions Weight Bearing Restrictions: No    Mobility  Bed Mobility Overal bed mobility: Needs Assistance Bed Mobility: Supine to Sit     Supine to sit: Total assist     General bed mobility comments: foot egress positioning to achieve sitting. total assist to lift trunk from surface with pt with lack of head control after grossly 10 sec with need for supported sitting. Pt with no effort to assist with transfers or positioning    Transfers                 General transfer comment: unable  Ambulation/Gait                 Stairs             Wheelchair Mobility    Modified Rankin (Stroke Patients Only)       Balance Overall balance assessment: Needs assistance   Sitting balance-Leahy Scale: Zero Sitting balance - Comments:  no righting reaction                                    Cognition Arousal/Alertness: Awake/alert Behavior During Therapy: Flat affect Overall Cognitive Status: Impaired/Different from baseline                                 General Comments: pt without significant response to questions, commands or mobility. pt mouthing 'Ow" with noxious stimuli to bil LE but does not withdraw      Exercises General Exercises - Upper Extremity Shoulder Flexion: PROM;Both;Seated;10 reps General Exercises - Lower Extremity Long Arc Quad: PROM;Both;Seated;10 reps Hip Flexion/Marching: PROM;Both;Seated;10 reps    General Comments        Pertinent Vitals/Pain Pain Assessment: No/denies pain    Home Living                      Prior Function            PT Goals (current goals can now be found in the care plan section) Progress towards PT goals: Not progressing toward goals - comment    Frequency    Min 2X/week      PT Plan Current plan remains appropriate  Co-evaluation              AM-PAC PT "6 Clicks" Mobility   Outcome Measure  Help needed turning from your back to your side while in a flat bed without using bedrails?: Total Help needed moving from lying on your back to sitting on the side of a flat bed without using bedrails?: Total Help needed moving to and from a bed to a chair (including a wheelchair)?: Total Help needed standing up from a chair using your arms (e.g., wheelchair or bedside chair)?: Total Help needed to walk in hospital room?: Total Help needed climbing 3-5 steps with a railing? : Total 6 Click Score: 6    End of Session   Activity Tolerance: Patient tolerated treatment well Patient left: in bed;with call bell/phone within reach;with bed alarm set Nurse Communication: Mobility status;Need for lift equipment PT Visit Diagnosis: Muscle weakness (generalized) (M62.81);Other symptoms and signs involving the  nervous system (R29.898)     Time: [id number] PT Time Calculation (min) (ACUTE ONLY): 22 min  Charges:  $Therapeutic Activity: 8-22 mins                     Thurston Brendlinger P, PT Acute Rehabilitation Services Pager: [phone] Office: [place] Travian Kerner 01/05/2021, 11:48 AM

## 2021-01-05 NOTE — Progress Notes (Signed)
NAME:  Luis Hunter, MRN:  [id number], DOB:  01/04/1976, LOS: 8 ADMISSION DATE:  12/16/2020, CONSULTATION DATE:  8/13 REFERRING MD:  Starleen Blue EDP [facility], CHIEF COMPLAINT:  Seizure   History of Present Illness:  45 y/o male found unresponsive after having multiple seizures.  Intubated for airway protection and loadded with Keppra.  UDS positive for cocaine and THC.  Transferred to [facility] to [facility] for cont EEG.  Since admission he has had a LHC for an abnormal EKG, but this was felt to be due to severe LVH.  He has been treated with antibiotics for pneumonia.  He has had persistent fevers for days.   Pertinent  Medical History  Schizophrenia Cocaine abuse Bipolar disorder Depression  Significant Hospital Events: Including procedures, antibiotic start and stop dates in addition to other pertinent events   8/13 intubated, transferred to [facility] 8/20 vent dyssynchrony >> add precedex; EEG with periodic triphasic morphology and generalized slowing 8/23 remains encephalopathic, required escalation of sedatives for vent dyssynchrony  8/28 goals of care conversation: plan tracheostomy  8/29 dilantin stopped by neurology, tracheostomy 8/31 followed commands, stopped precedex but restarted overnight  Tubes/lines: 8/13 ETT > 8/29 8/29 tracheostomy >   Antibiotics 8/16 started on unasyn 8/17 unasyn discontinued 8/18 started on zosyn 8/25 started augmentin for sinusitis 8/28 Meropenem  Cultures 8/16 respiratory aspirate with gram positive cocci, few gram negative rods 8/26 resp culture > serratia   Procedures/imaging: 8/22 cardiac catheterization for STEMI-no significant coronary artery disease discovered 8/24 MRI-mastoid fullness, no intracerebral abnormality 8/27 on cEEG 8/29 RUQ ultrasound > hepatic hemangioma 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  Objective   Blood pressure (!) 131/92, pulse 93, temperature 99 F (37.2 C),  temperature source Axillary, resp. rate (!) 28, height '5\' 11"'$  (1.803 m), weight 78.2 kg, SpO2 98 %.    Vent Mode: PSV;CPAP FiO2 (%):  [40 %] 40 % Set Rate:  [12 bmp] 12 bmp Vt Set:  [600 mL] 600 mL PEEP:  [5 cmH20] 5 cmH20 Pressure Support:  [5 cmH20-10 cmH20] 5 cmH20 Plateau Pressure:  [9 cmH20-14 cmH20] 14 cmH20   Intake/Output Summary (Last 24 hours) at 01/05/2021 [id number] Last data filed at 01/05/2021 0800 Gross per 24 hour  Intake 2593.12 ml  Output 2475 ml  Net 118.12 ml   Filed Weights   01/03/21 0420 01/04/21 0327 01/05/21 0500  Weight: 84.6 kg 77.6 kg 78.2 kg    Examination:  General - alert Eyes - pupils reactive ENT - trach site clean Cardiac - regular rate/rhythm, no murmur Chest - b/l rhonchi Abdomen - soft, non tender, + bowel sounds Extremities - no cyanosis, clubbing, or edema Skin - no rashes Neuro - follows simple commands, moves extremities   Resolved Hospital Problem list   Hypernatremia  Assessment & Plan:    Acute metabolic encephalopathy. Polysubstance abuse with hx of depression, bipolar, and schizophrenia. Epilepsy. - initially felt to be post ictal.  now concern is for sepsis, hypoxia, and underlying mental health disorder - wean off precedex to keep RASS 0 - increase klonopin to 0.5 mg q8h - add remeron qhs - restart outpt abilify - continue vimpat, keppra  Acute hypoxemic respiratory failure from HCAP. Presumptive diagnosis of asthma. 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 - pressure support wean as able - trach care - f/u CXR intermittently - bronchial hygiene - goal SpO2 > 92% - continue pulmicort, brovana  HCAP with Serratia and Enterobacter. -  recurrent fever with increased WBC and respiratory secretions on 8/28 - day 6 of meropenem  Hypertension. - goal SBP < 140, DBP < 95 - continue norvasc, HCTZ, lopressor  Elevated LFTs from shock and hypoxia. Liver hemangioma. - Will need outpatient repeat imaging of hepatic hemangioma -  f/u LFTs intermittently  Best Practice (right click and "Reselect all SmartList Selections" daily)   Diet/type: tubefeeds DVT prophylaxis: LMWH GI prophylaxis: PPI Lines: N/A Foley:  N/A Code Status:  full code Last date of multidisciplinary goals of care discussion [8/28]  Labs    CMP Latest Ref Rng & Units 01/05/2021 01/04/2021 01/03/2021  Glucose 70 - 99 mg/dL 130(H) 136(H) 138(H)  BUN 6 - 20 mg/dL '15 16 19  '$ Creatinine 0.61 - 1.24 mg/dL 0.78 0.56(L) 0.56(L)  Sodium 135 - 145 mmol/L 137 138 140  Potassium 3.5 - 5.1 mmol/L 3.7 3.1(L) 3.7  Chloride 98 - 111 mmol/L 99 102 107  CO2 22 - 32 mmol/L '28 27 28  '$ Calcium 8.9 - 10.3 mg/dL 8.3(L) 7.9(L) 7.8(L)  Total Protein 6.5 - 8.1 g/dL - 6.4(L) 5.9(L)  Total Bilirubin 0.3 - 1.2 mg/dL - 1.1 0.7  Alkaline Phos 38 - 126 U/L - 148(H) 144(H)  AST 15 - 41 U/L - 256(H) 315(H)  ALT 0 - 44 U/L - 477(H) 485(H)    CBC Latest Ref Rng & Units 01/05/2021 01/04/2021 01/03/2021  WBC 4.0 - 10.5 K/uL 17.9(H) 19.0(H) 15.2(H)  Hemoglobin 13.0 - 17.0 g/dL 9.1(L) 9.0(L) 8.5(L)  Hematocrit 39.0 - 52.0 % 27.6(L) 26.6(L) 26.0(L)  Platelets 150 - 400 K/uL 401(H) 430(H) 425(H)    CBG (last 3)  Recent Labs    01/05/21 0029 01/05/21 0358 01/05/21 0742  GLUCAP 127* 117* 105*    ABG    Component Value Date/Time   PHART 7.366 12/31/2020 2242   PCO2ART 59.2 (H) 12/31/2020 2242   PO2ART 100 12/31/2020 2242   HCO3 33.8 (H) 12/31/2020 2242   TCO2 36 (H) 12/31/2020 2242   ACIDBASEDEF 1.0 12/21/2020 1101   O2SAT 97.0 12/31/2020 2242    Critical care time: 34 minutes  Chesley Mires, MD [place] Pager - [phone] 01/05/2021, 10:04 AM

## 2021-01-05 NOTE — Progress Notes (Signed)
Assisted tele visit to patient with family member.  Stan Cantave Anderson, RN   

## 2021-01-06 DIAGNOSIS — J9602 Acute respiratory failure with hypercapnia: Secondary | ICD-10-CM | POA: Diagnosis not present

## 2021-01-06 LAB — COMPREHENSIVE METABOLIC PANEL
ALT: 379 U/L — ABNORMAL HIGH (ref 0–44)
AST: 179 U/L — ABNORMAL HIGH (ref 15–41)
Albumin: 1.6 g/dL — ABNORMAL LOW (ref 3.5–5.0)
Alkaline Phosphatase: 150 U/L — ABNORMAL HIGH (ref 38–126)
Anion gap: 7 (ref 5–15)
BUN: 16 mg/dL (ref 6–20)
CO2: 28 mmol/L (ref 22–32)
Calcium: 8.3 mg/dL — ABNORMAL LOW (ref 8.9–10.3)
Chloride: 100 mmol/L (ref 98–111)
Creatinine, Ser: 0.65 mg/dL (ref 0.61–1.24)
GFR, Estimated: >60 mL/min (ref >60)
Glucose, Bld: 116 mg/dL — ABNORMAL HIGH (ref 70–99)
Potassium: 4.2 mmol/L (ref 3.5–5.1)
Sodium: 135 mmol/L (ref 135–145)
Total Bilirubin: 1 mg/dL (ref 0.3–1.2)
Total Protein: 6.5 g/dL (ref 6.5–8.1)

## 2021-01-06 LAB — CBC
HCT: 26.4 % — ABNORMAL LOW (ref 39.0–52.0)
Hemoglobin: 8.8 g/dL — ABNORMAL LOW (ref 13.0–17.0)
MCH: 28.8 pg (ref 26.0–34.0)
MCHC: 33.3 g/dL (ref 30.0–36.0)
MCV: 86.3 fL (ref 80.0–100.0)
Platelets: 319 10*3/uL (ref 150–400)
RBC: 3.06 MIL/uL — ABNORMAL LOW (ref 4.22–5.81)
RDW: 15 % (ref 11.5–15.5)
WBC: 17.4 10*3/uL — ABNORMAL HIGH (ref 4.0–10.5)
nRBC: 0.2 % (ref 0.0–0.2)

## 2021-01-06 LAB — GLUCOSE, CAPILLARY
Glucose-Capillary: 119 mg/dL — ABNORMAL HIGH (ref 70–99)
Glucose-Capillary: 118 mg/dL — ABNORMAL HIGH (ref 70–99)
Glucose-Capillary: 110 mg/dL — ABNORMAL HIGH (ref 70–99)
Glucose-Capillary: 117 mg/dL — ABNORMAL HIGH (ref 70–99)
Glucose-Capillary: 115 mg/dL — ABNORMAL HIGH (ref 70–99)
Glucose-Capillary: 129 mg/dL — ABNORMAL HIGH (ref 70–99)

## 2021-01-06 MED ORDER — CLONAZEPAM 0.5 MG PO TBDP
1.0000 mg | ORAL_TABLET | Freq: Three times a day (TID) | ORAL | Status: DC
  Administered 2021-01-06 – 2021-01-09 (×9): 1 mg
  Filled 2021-01-06 (×9): qty 2

## 2021-01-06 MED ORDER — CLONIDINE HCL 0.2 MG PO TABS
0.1000 mg | ORAL_TABLET | Freq: Two times a day (BID) | ORAL | Status: AC
Start: 2021-01-08 — End: 2021-01-09
  Administered 2021-01-08 – 2021-01-09 (×4): 0.1 mg via ORAL
  Filled 2021-01-06 (×4): qty 1

## 2021-01-06 MED ORDER — DICYCLOMINE HCL 20 MG PO TABS
20.0000 mg | ORAL_TABLET | Freq: Four times a day (QID) | ORAL | Status: DC | PRN
  Filled 2021-01-06: qty 1

## 2021-01-06 MED ORDER — CLONIDINE HCL 0.2 MG PO TABS
0.1000 mg | ORAL_TABLET | Freq: Every day | ORAL | Status: AC
  Administered 2021-01-10 – 2021-01-11 (×2): 0.1 mg via ORAL
  Filled 2021-01-06: qty 0.5
  Filled 2021-01-06 (×2): qty 1

## 2021-01-06 MED ORDER — HYDROXYZINE HCL 25 MG PO TABS
25.0000 mg | ORAL_TABLET | Freq: Four times a day (QID) | ORAL | Status: DC | PRN
  Administered 2021-01-10 (×2): 25 mg via ORAL
  Filled 2021-01-06 (×2): qty 1

## 2021-01-06 MED ORDER — AMLODIPINE BESYLATE 5 MG PO TABS
5.0000 mg | ORAL_TABLET | Freq: Every day | ORAL | Status: DC
  Administered 2021-01-06 – 2021-01-10 (×5): 5 mg
  Filled 2021-01-06 (×5): qty 1

## 2021-01-06 MED ORDER — ONDANSETRON 4 MG PO TBDP: 4.0000 mg | ORAL_TABLET | Freq: Four times a day (QID) | ORAL | Status: AC | PRN

## 2021-01-06 MED ORDER — CLONIDINE HCL 0.2 MG PO TABS
0.1000 mg | ORAL_TABLET | Freq: Four times a day (QID) | ORAL | Status: AC
  Administered 2021-01-06 – 2021-01-07 (×8): 0.1 mg via ORAL
  Filled 2021-01-06 (×8): qty 1

## 2021-01-06 MED ORDER — METHOCARBAMOL 500 MG PO TABS
500.0000 mg | ORAL_TABLET | Freq: Three times a day (TID) | ORAL | Status: DC | PRN
  Administered 2021-01-10: 500 mg via ORAL
  Filled 2021-01-06: qty 1

## 2021-01-06 MED ORDER — REVEFENACIN 175 MCG/3ML IN SOLN
175.0000 ug | Freq: Every day | RESPIRATORY_TRACT | Status: DC
  Administered 2021-01-06 – 2021-01-16 (×11): 175 ug via RESPIRATORY_TRACT
  Filled 2021-01-06 (×12): qty 3

## 2021-01-06 MED ORDER — ALBUTEROL SULFATE (2.5 MG/3ML) 0.083% IN NEBU
2.5000 mg | INHALATION_SOLUTION | RESPIRATORY_TRACT | Status: DC | PRN
  Administered 2021-01-11: 2.5 mg via RESPIRATORY_TRACT
  Filled 2021-01-06: qty 3

## 2021-01-06 MED ORDER — LOPERAMIDE HCL 2 MG PO CAPS
2.0000 mg | ORAL_CAPSULE | ORAL | Status: DC | PRN
  Filled 2021-01-06: qty 2

## 2021-01-06 NOTE — Progress Notes (Signed)
NAME:  Luis Hunter, MRN:  [id number], DOB:  07/23/1975, LOS: 21 ADMISSION DATE:  12/16/2020, CONSULTATION DATE:  8/13 REFERRING MD:  Starleen Blue EDP [facility], CHIEF COMPLAINT:  Seizure   History of Present Illness:  45 y/o male found unresponsive after having multiple seizures.  Intubated for airway protection and loadded with Keppra.  UDS positive for cocaine and THC.  Transferred from [facility] to [facility] for continuous EEG.  Since admission he has had a LHC for an abnormal EKG, but this was felt to be due to severe LVH.  He has been treated with antibiotics for pneumonia.  He has had recurrent fevers for days.   Pertinent  Medical History  Schizophrenia, Cocaine abuse, Bipolar disorder, Depression  Significant Hospital Events: Including procedures, antibiotic start and stop dates in addition to other pertinent events   8/13 intubated, transferred to [facility] 8/20 vent dyssynchrony >> add precedex; EEG with periodic triphasic morphology and generalized slowing 8/23 remains encephalopathic, required escalation of sedatives for vent dyssynchrony  8/28 goals of care conversation: plan tracheostomy  8/29 dilantin stopped by neurology, tracheostomy 8/31 followed commands, stopped precedex but restarted overnight 9/03 changed to pressure control, start clonidine taper  Tubes/lines: 8/13 ETT > 8/29 8/29 tracheostomy >   Antibiotics 8/16 started on unasyn 8/17 unasyn discontinued 8/18 started on zosyn 8/25 started augmentin for sinusitis 8/28 Meropenem  Cultures 8/16 respiratory aspirate with gram positive cocci, few gram negative rods 8/26 resp culture > serratia   Procedures/imaging: 8/22 cardiac catheterization for STEMI-no significant coronary artery disease discovered 8/24 MRI-mastoid fullness, no intracerebral abnormality 8/27 on cEEG 8/29 RUQ ultrasound > hepatic hemangioma 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  Remains on precedex.  Objective    Blood pressure (!) 129/93, pulse 98, temperature 99.5 F (37.5 C), temperature source Oral, resp. rate (!) 23, height '5\' 11"'$  (1.803 m), weight 76.4 kg, SpO2 97 %.    Vent Mode: PRVC FiO2 (%):  [40 %] 40 % Set Rate:  [12 bmp] 12 bmp Vt Set:  [300 mL-600 mL] 600 mL PEEP:  [5 cmH20] 5 cmH20 Pressure Support:  [10 cmH20] 10 cmH20 Plateau Pressure:  [13 cmH20-19 cmH20] 18 cmH20   Intake/Output Summary (Last 24 hours) at 01/06/2021 0858 Last data filed at 01/06/2021 0800 Gross per 24 hour  Intake 2580.64 ml  Output 2500 ml  Net 80.64 ml   Filed Weights   01/04/21 0327 01/05/21 0500 01/06/21 0500  Weight: 77.6 kg 78.2 kg 76.4 kg    Examination:  General - on vent Eyes - pupils reactive ENT - cortrak in place, trach site clean Cardiac - regular rate/rhythm, no murmur Chest - scattered rhonchi Abdomen - soft, non tender, + bowel sounds Extremities - 1+ edema Skin - no rashes Neuro - intermittent head tremor  Resolved Hospital Problem list   Hypernatremia  Assessment & Plan:   Acute metabolic encephalopathy. Polysubstance abuse with hx of depression, bipolar, and schizophrenia. Epilepsy. - initially felt to be post ictal.  now concern is for sepsis, hypoxia, and underlying mental health disorder - start clonidine taper on 9/03 and wean of precedex to keep RASS 0 - increased klonopin to 1 mg q8h - continue remeron, abilify - continue vimpat, keppra  Acute hypoxemic respiratory failure from HCAP. Presumptive diagnosis of asthma. 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 - change to pressure control on 9/03 - trach care - f/u CXR - bronchial hygiene - goal SpO2 > 92% - pulmicort, brovana,  yupelri with prn albuterol  HCAP with Serratia and Enterobacter. - recurrent fever with increased WBC and respiratory secretions on 8/28 - day 7 of meropenem >> d/c after dose on 9/03  Hypertension. - goal SBP < 140, DBP < 95 - decrease dose of norvasc while on clonidine - continue HCTZ,  lopressor  Elevated LFTs from shock and hypoxia >> improving. Liver hemangioma. - Will need outpatient repeat imaging of hepatic hemangioma - f/u LFTs intermittently  Dysphagia. - will likely need PEG >> will need to d/w family  Disposition. - will likely need LTAC  Best Practice (right click and "Reselect all SmartList Selections" daily)   Diet/type: tubefeeds DVT prophylaxis: LMWH GI prophylaxis: PPI Lines: N/A Foley:  N/A Code Status:  full code Last date of multidisciplinary goals of care discussion [8/28]  Labs    CMP Latest Ref Rng & Units 01/06/2021 01/05/2021 01/04/2021  Glucose 70 - 99 mg/dL 116(H) 130(H) 136(H)  BUN 6 - 20 mg/dL '16 15 16  '$ Creatinine 0.61 - 1.24 mg/dL 0.65 0.78 0.56(L)  Sodium 135 - 145 mmol/L 135 137 138  Potassium 3.5 - 5.1 mmol/L 4.2 3.7 3.1(L)  Chloride 98 - 111 mmol/L 100 99 102  CO2 22 - 32 mmol/L '28 28 27  '$ Calcium 8.9 - 10.3 mg/dL 8.3(L) 8.3(L) 7.9(L)  Total Protein 6.5 - 8.1 g/dL 6.5 - 6.4(L)  Total Bilirubin 0.3 - 1.2 mg/dL 1.0 - 1.1  Alkaline Phos 38 - 126 U/L 150(H) - 148(H)  AST 15 - 41 U/L 179(H) - 256(H)  ALT 0 - 44 U/L 379(H) - 477(H)    CBC Latest Ref Rng & Units 01/06/2021 01/05/2021 01/04/2021  WBC 4.0 - 10.5 K/uL 17.4(H) 17.9(H) 19.0(H)  Hemoglobin 13.0 - 17.0 g/dL 8.8(L) 9.1(L) 9.0(L)  Hematocrit 39.0 - 52.0 % 26.4(L) 27.6(L) 26.6(L)  Platelets 150 - 400 K/uL 319 401(H) 430(H)    CBG (last 3)  Recent Labs    01/05/21 2310 01/06/21 0314 01/06/21 0725  GLUCAP 108* 119* 118*    ABG    Component Value Date/Time   PHART 7.366 12/31/2020 2242   PCO2ART 59.2 (H) 12/31/2020 2242   PO2ART 100 12/31/2020 2242   HCO3 33.8 (H) 12/31/2020 2242   TCO2 36 (H) 12/31/2020 2242   ACIDBASEDEF 1.0 12/21/2020 1101   O2SAT 97.0 12/31/2020 2242    Critical care time: 33 minutes  Chesley Mires, MD [place] Pager - [phone] 01/06/2021, 8:58 AM

## 2021-01-07 ENCOUNTER — Inpatient Hospital Stay (HOSPITAL_COMMUNITY): Payer: Medicare HMO

## 2021-01-07 DIAGNOSIS — J9602 Acute respiratory failure with hypercapnia: Secondary | ICD-10-CM | POA: Diagnosis not present

## 2021-01-07 LAB — BASIC METABOLIC PANEL
Anion gap: 8 (ref 5–15)
BUN: 20 mg/dL (ref 6–20)
CO2: 27 mmol/L (ref 22–32)
Calcium: 8.4 mg/dL — ABNORMAL LOW (ref 8.9–10.3)
Chloride: 98 mmol/L (ref 98–111)
Creatinine, Ser: 0.66 mg/dL (ref 0.61–1.24)
GFR, Estimated: >60 mL/min (ref >60)
Glucose, Bld: 125 mg/dL — ABNORMAL HIGH (ref 70–99)
Potassium: 5 mmol/L (ref 3.5–5.1)
Sodium: 133 mmol/L — ABNORMAL LOW (ref 135–145)

## 2021-01-07 LAB — CBC
HCT: 27.3 % — ABNORMAL LOW (ref 39.0–52.0)
Hemoglobin: 9 g/dL — ABNORMAL LOW (ref 13.0–17.0)
MCH: 28.8 pg (ref 26.0–34.0)
MCHC: 33 g/dL (ref 30.0–36.0)
MCV: 87.5 fL (ref 80.0–100.0)
Platelets: 308 10*3/uL (ref 150–400)
RBC: 3.12 MIL/uL — ABNORMAL LOW (ref 4.22–5.81)
RDW: 15 % (ref 11.5–15.5)
WBC: 18.5 10*3/uL — ABNORMAL HIGH (ref 4.0–10.5)
nRBC: 0.3 % — ABNORMAL HIGH (ref 0.0–0.2)

## 2021-01-07 LAB — CULTURE, RESPIRATORY W GRAM STAIN

## 2021-01-07 LAB — GLUCOSE, CAPILLARY
Glucose-Capillary: 126 mg/dL — ABNORMAL HIGH (ref 70–99)
Glucose-Capillary: 112 mg/dL — ABNORMAL HIGH (ref 70–99)
Glucose-Capillary: 125 mg/dL — ABNORMAL HIGH (ref 70–99)
Glucose-Capillary: 108 mg/dL — ABNORMAL HIGH (ref 70–99)
Glucose-Capillary: 96 mg/dL (ref 70–99)
Glucose-Capillary: 114 mg/dL — ABNORMAL HIGH (ref 70–99)

## 2021-01-07 IMAGING — DX DG CHEST 1V PORT
1 series · 1 of 1 positions shown · non-contrast
Comparison: [DATE] chest radiograph.

CLINICAL DATA: Healthcare associated pneumonia

EXAM:
PORTABLE CHEST 1 VIEW

[chest]
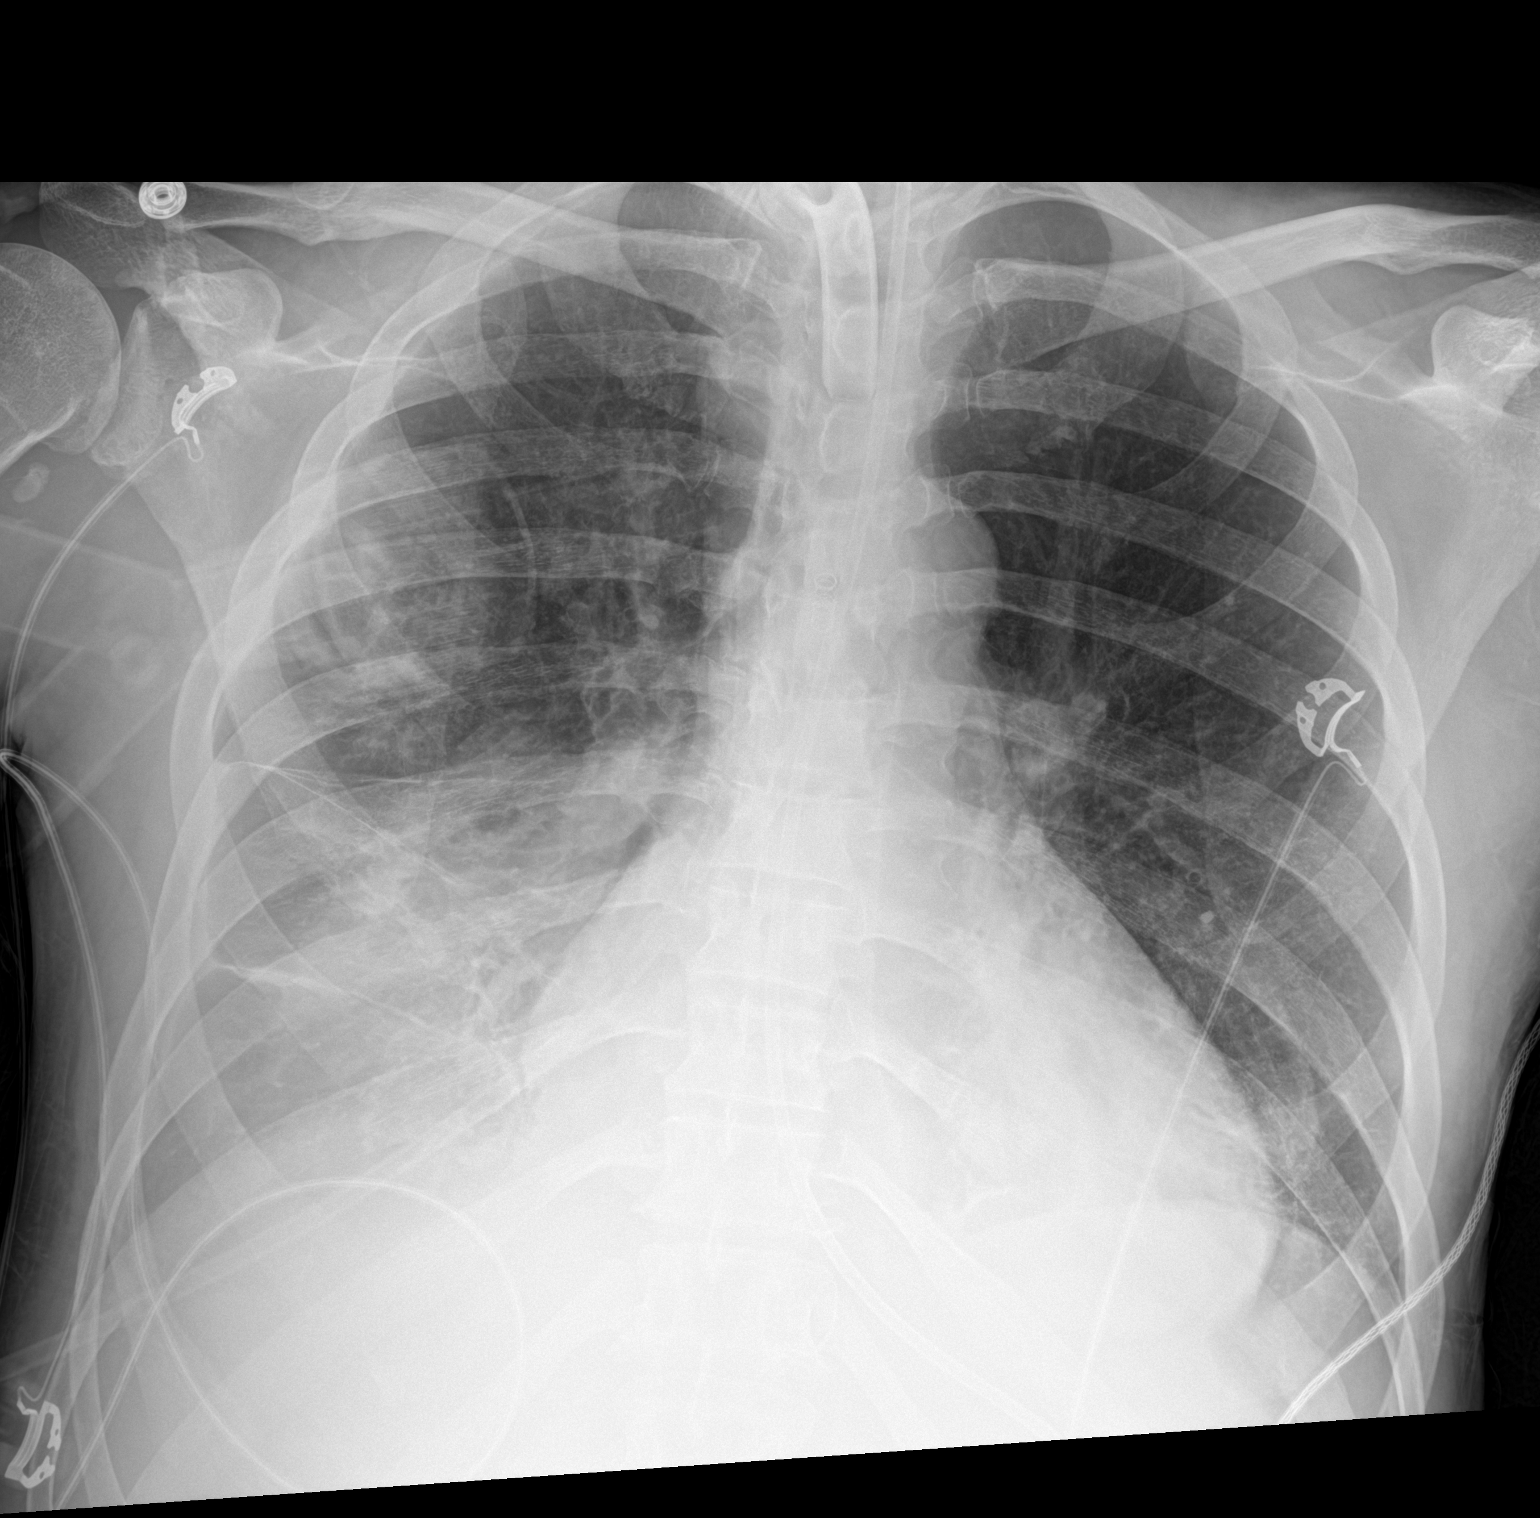

[1 of 1 positions shown; findings below may reference images not displayed]

FINDINGS: Tracheostomy tube tip overlies the tracheal air column just below
the thoracic inlet. Enteric tube enters stomach with the tip not
seen on this image. Stable cardiomediastinal silhouette with mild
cardiomegaly. No pneumothorax. Small right pleural effusion is
stable. No left pleural effusion. Hazy patchy mid to lower right
lung and left lung base opacities, similar on the right and slightly
worsened on the left.
IMPRESSION: 1. Hazy patchy mid to lower right lung and left lung base opacities,
similar on the right and slightly worsened on the left, compatible
with multifocal pneumonia.
2. Stable small right pleural effusion.

## 2021-01-07 IMAGING — DX DG ABD PORTABLE 1V
1 series · 2 of 2 positions shown · non-contrast
Comparison: Prior abdominal radiographs.

CLINICAL DATA: Constipation.

EXAM:
PORTABLE ABDOMEN - 1 VIEW

[Series 1: abdomen · 0.14mm/px · 2 of 2 slices shown]
[im 1/2]
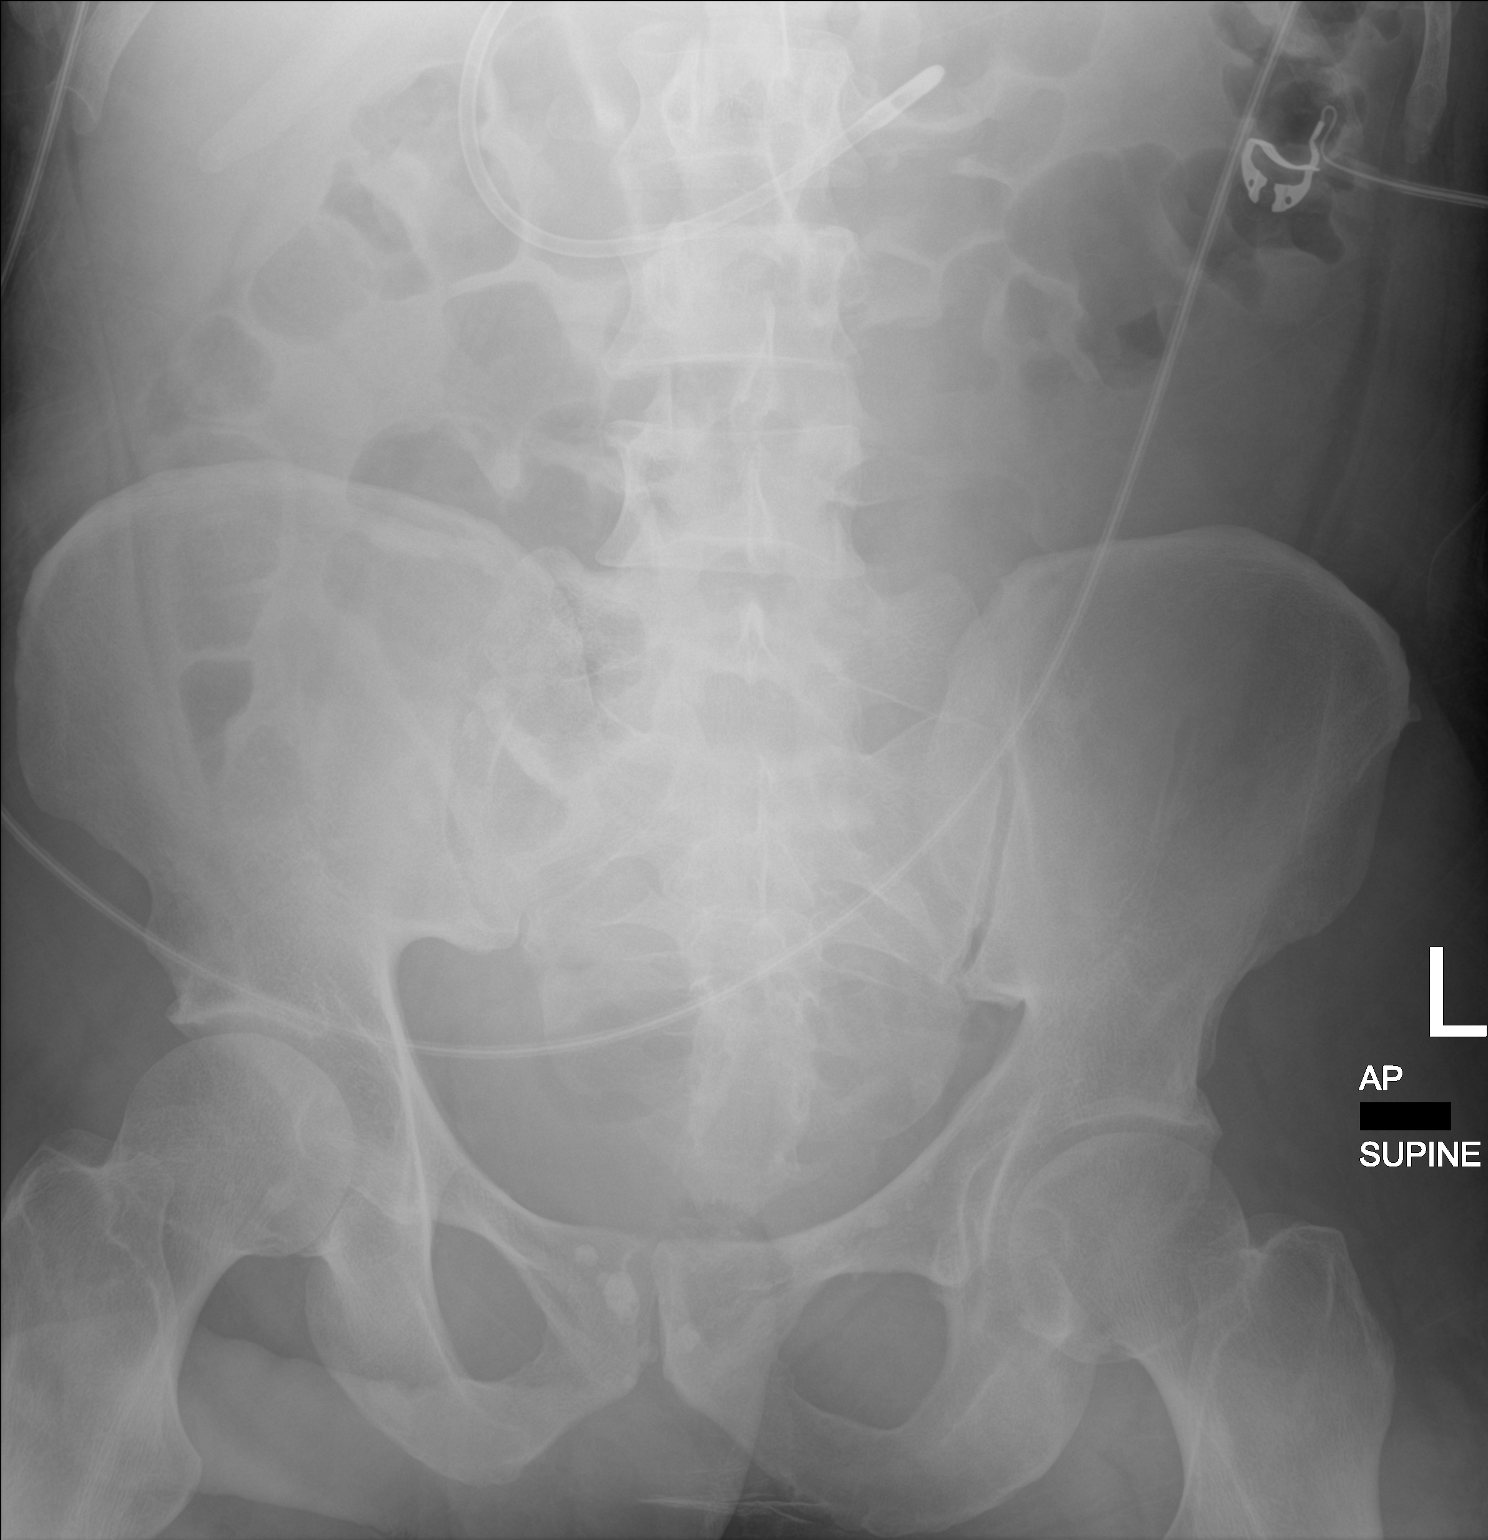
[im 2/2]
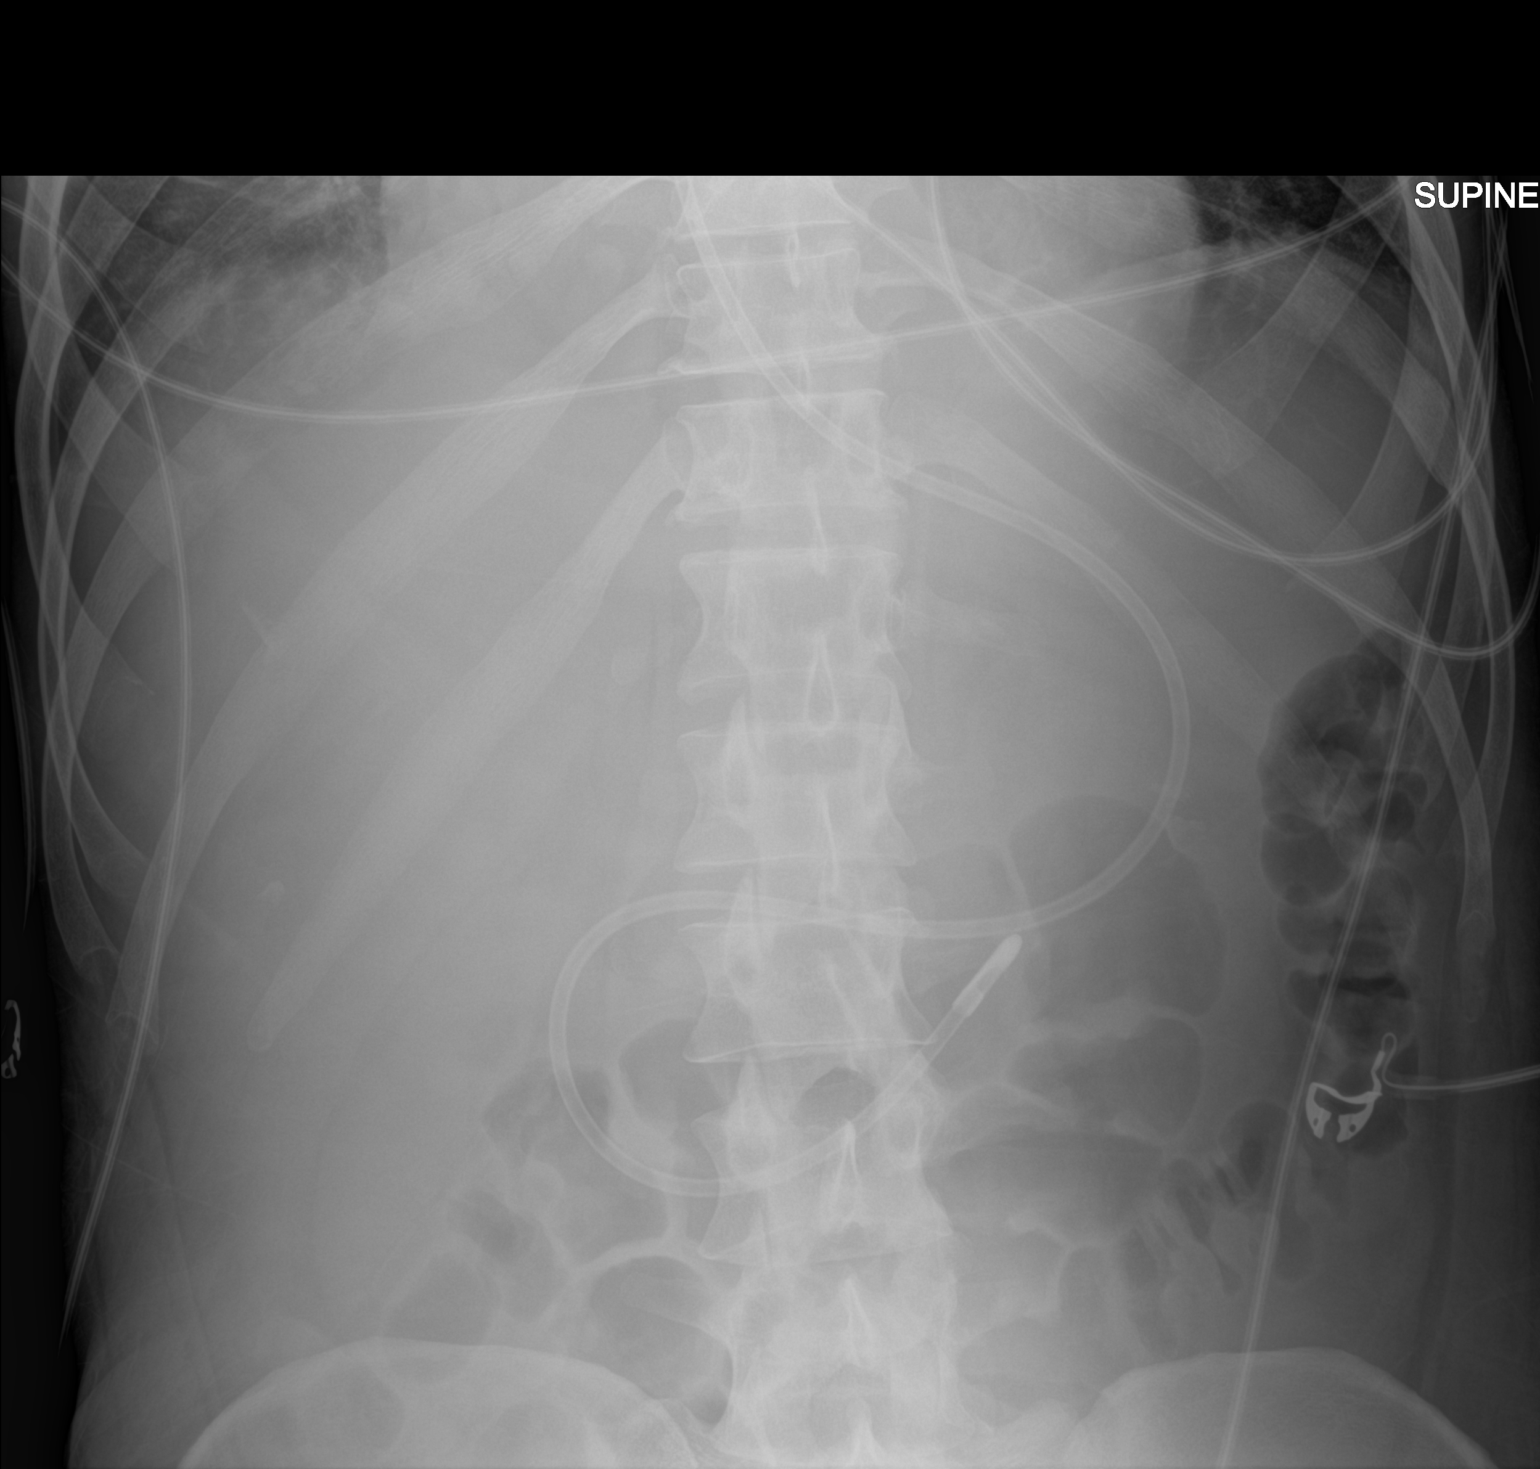

[2 of 2 positions shown; findings below may reference images not displayed]

FINDINGS: Divided supine views of the abdomen obtained. Weighted enteric tube
in place with tip in the region of the duodenal jejunal junction.
Generalized paucity of small bowel gas. There is air within
nondistended ascending, transverse, and sigmoid colon. No
significant formed stool is seen. Calcification in the right upper
quadrant is felt to be related to chondral cartilage rather than a
soft tissue calcification. Right lower lobe airspace disease and
cardiomegaly were assessed on chest radiograph earlier today.
IMPRESSION: 1. Nonspecific generalized paucity of small bowel gas.
2. Air within nondilated colon. No significant formed stool to
suggest constipation.
3. Feeding tube in place.

## 2021-01-07 MED ORDER — POLYETHYLENE GLYCOL 3350 17 G PO PACK
17.0000 g | PACK | Freq: Every day | ORAL | Status: DC
  Administered 2021-01-07 – 2021-01-10 (×4): 17 g
  Filled 2021-01-07 (×4): qty 1

## 2021-01-07 NOTE — Progress Notes (Signed)
CCM notified of pupil size differing from right to left. Patient has no other focal deficits. CCM thinks the difference is a result of nebulizer treatments. Patient had recent CT Head on 01/03/2021 for this issue. Will continue to monitor.

## 2021-01-07 NOTE — Progress Notes (Signed)
NAME:  Luis Hunter, MRN:  [id number], DOB:  10/21/1975, LOS: 26 ADMISSION DATE:  12/16/2020, CONSULTATION DATE:  8/13 REFERRING MD:  Starleen Blue EDP [facility], CHIEF COMPLAINT:  Seizure   History of Present Illness:  45 y/o male found unresponsive after having multiple seizures.  Intubated for airway protection and loadded with Keppra.  UDS positive for cocaine and THC.  Transferred from [facility] to [facility] for continuous EEG.  Since admission he has had a LHC for an abnormal EKG, but this was felt to be due to severe LVH.  He has been treated with antibiotics for pneumonia.  He has had recurrent fevers for days.   Pertinent  Medical History  Schizophrenia, Cocaine abuse, Bipolar disorder, Depression  Significant Hospital Events: Including procedures, antibiotic start and stop dates in addition to other pertinent events   8/13 intubated, transferred to [facility] 8/20 vent dyssynchrony >> add precedex; EEG with periodic triphasic morphology and generalized slowing 8/23 remains encephalopathic, required escalation of sedatives for vent dyssynchrony  8/28 goals of care conversation: plan tracheostomy  8/29 dilantin stopped by neurology, tracheostomy 8/31 followed commands, stopped precedex but restarted overnight 9/03 changed to pressure control, start clonidine taper  Tubes/lines: 8/13 ETT > 8/29 8/29 tracheostomy >   Antibiotics 8/16 started on unasyn 8/17 unasyn discontinued 8/18 started on zosyn 8/25 started augmentin for sinusitis 8/28 Meropenem  Cultures 8/16 respiratory aspirate with gram positive cocci, few gram negative rods 8/26 resp culture > serratia   Procedures/imaging: 8/22 cardiac catheterization for STEMI-no significant coronary artery disease discovered 8/24 MRI-mastoid fullness, no intracerebral abnormality 8/27 on cEEG 8/29 RUQ ultrasound > hepatic hemangioma 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  Tolerating pressure support.  Objective    Blood pressure 110/75, pulse 86, temperature 97.6 F (36.4 C), temperature source Axillary, resp. rate 15, height '5\' 11"'$  (1.803 m), weight 75 kg, SpO2 99 %.    Vent Mode: PSV;CPAP FiO2 (%):  [30 %] 30 % Set Rate:  [16 bmp] 16 bmp Vt Set:  [600 mL] 600 mL PEEP:  [5 cmH20] 5 cmH20 Pressure Support:  [12 cmH20] 12 cmH20 Plateau Pressure:  [18 cmH20] 18 cmH20   Intake/Output Summary (Last 24 hours) at 01/07/2021 0842 Last data filed at 01/07/2021 0700 Gross per 24 hour  Intake 2366.48 ml  Output 2375 ml  Net -8.52 ml   Filed Weights   01/05/21 0500 01/06/21 0500 01/07/21 0500  Weight: 78.2 kg 76.4 kg 75 kg    Examination:  General - on vent Eyes - pupils reactive ENT - trach site clean Cardiac - regular rate/rhythm, no murmur Chest - scattered rhonchi Abdomen - soft, non tender, + bowel sounds Extremities - no cyanosis, clubbing, or edema Skin - no rashes Neuro - intermittent head tremor  Resolved Hospital Problem list   Hypernatremia  Assessment & Plan:   Acute metabolic encephalopathy. Polysubstance abuse with hx of depression, bipolar, and schizophrenia. Epilepsy. - initially felt to be post ictal.  now concern is for sepsis, hypoxia, and underlying mental health disorder - started clonidine taper on 9/03 to wean off precedex - continue klonopin 1 mg q8h - continue remeron, abilify - continue vimpat, keppra  Acute hypoxemic respiratory failure from HCAP. Presumptive diagnosis of asthma. 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 - changed to pressure control on 9/03 - pressure support wean to trach collar as able - d/c trach sutures on 9/04 - f/u CXR intermittently - bronchial hygiene - goal SpO2 >  92% - continue pulmicort, brovana, yupelri with prn albuterol  HCAP with Serratia and Enterobacter. - recurrent fever with increased WBC and respiratory secretions on 8/28 - completed ABx on 9/03  Hypertension. - goal SBP < 140, DBP < 95 - decreased dose of norvasc  while on clonidine - continue HCTZ, lopressor  Elevated LFTs from shock and hypoxia >> improving. Liver hemangioma. - Will need outpatient repeat imaging of hepatic hemangioma - f/u LFT intermittently  Dysphagia. - will likely need PEG >> if family agreeable, then need to plan for later this week  Disposition. - will likely need LTAC  Best Practice (right click and "Reselect all SmartList Selections" daily)   Diet/type: tubefeeds DVT prophylaxis: LMWH GI prophylaxis: PPI Lines: N/A Foley:  N/A Code Status:  full code Last date of multidisciplinary goals of care discussion [8/28]  Labs    CMP Latest Ref Rng & Units 01/07/2021 01/06/2021 01/05/2021  Glucose 70 - 99 mg/dL 125(H) 116(H) 130(H)  BUN 6 - 20 mg/dL '20 16 15  '$ Creatinine 0.61 - 1.24 mg/dL 0.66 0.65 0.78  Sodium 135 - 145 mmol/L 133(L) 135 137  Potassium 3.5 - 5.1 mmol/L 5.0 4.2 3.7  Chloride 98 - 111 mmol/L 98 100 99  CO2 22 - 32 mmol/L '27 28 28  '$ Calcium 8.9 - 10.3 mg/dL 8.4(L) 8.3(L) 8.3(L)  Total Protein 6.5 - 8.1 g/dL - 6.5 -  Total Bilirubin 0.3 - 1.2 mg/dL - 1.0 -  Alkaline Phos 38 - 126 U/L - 150(H) -  AST 15 - 41 U/L - 179(H) -  ALT 0 - 44 U/L - 379(H) -    CBC Latest Ref Rng & Units 01/07/2021 01/06/2021 01/05/2021  WBC 4.0 - 10.5 K/uL 18.5(H) 17.4(H) 17.9(H)  Hemoglobin 13.0 - 17.0 g/dL 9.0(L) 8.8(L) 9.1(L)  Hematocrit 39.0 - 52.0 % 27.3(L) 26.4(L) 27.6(L)  Platelets 150 - 400 K/uL 308 319 401(H)    CBG (last 3)  Recent Labs    01/06/21 2316 01/07/21 0317 01/07/21 0722  GLUCAP 129* 126* 112*    ABG    Component Value Date/Time   PHART 7.366 12/31/2020 2242   PCO2ART 59.2 (H) 12/31/2020 2242   PO2ART 100 12/31/2020 2242   HCO3 33.8 (H) 12/31/2020 2242   TCO2 36 (H) 12/31/2020 2242   ACIDBASEDEF 1.0 12/21/2020 1101   O2SAT 97.0 12/31/2020 2242    Signature:  Chesley Mires, MD [place] Pager - [phone] 01/07/2021, 8:42 AM

## 2021-01-08 ENCOUNTER — Inpatient Hospital Stay (HOSPITAL_COMMUNITY): Payer: Medicare HMO

## 2021-01-08 DIAGNOSIS — J9602 Acute respiratory failure with hypercapnia: Secondary | ICD-10-CM | POA: Diagnosis not present

## 2021-01-08 LAB — GLUCOSE, CAPILLARY
Glucose-Capillary: 105 mg/dL — ABNORMAL HIGH (ref 70–99)
Glucose-Capillary: 106 mg/dL — ABNORMAL HIGH (ref 70–99)
Glucose-Capillary: 120 mg/dL — ABNORMAL HIGH (ref 70–99)
Glucose-Capillary: 121 mg/dL — ABNORMAL HIGH (ref 70–99)
Glucose-Capillary: 129 mg/dL — ABNORMAL HIGH (ref 70–99)
Glucose-Capillary: 99 mg/dL (ref 70–99)

## 2021-01-08 IMAGING — DX DG CHEST 1V PORT
1 series · 1 of 1 positions shown · non-contrast
Comparison: Radiograph yesterday.

CLINICAL DATA: Trachea displaced.  Tracking for trach displacement.

EXAM:
PORTABLE CHEST 1 VIEW

[chest ap]
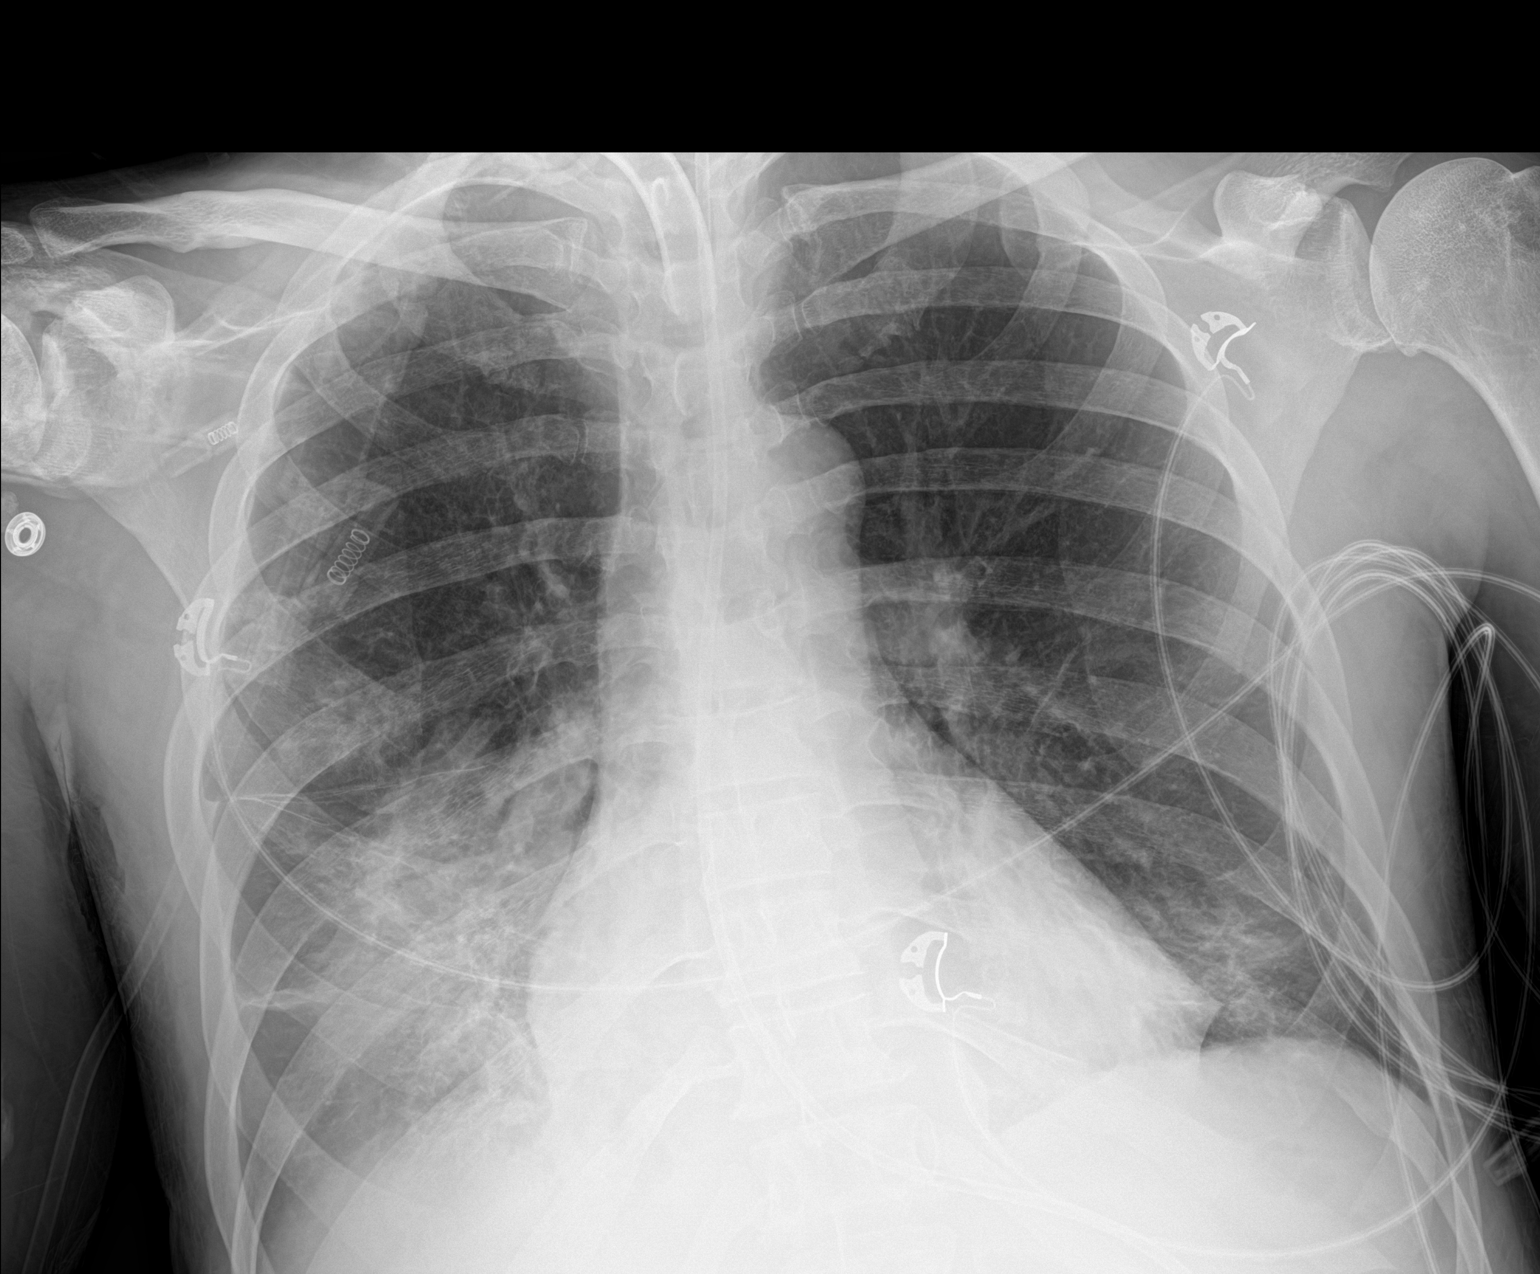

[1 of 1 positions shown; findings below may reference images not displayed]

FINDINGS: The tracheostomy tube projects over the trachea at the level of the
clavicular heads. This appears normally positioned on AP imaging.
Enteric tube remains in place. Slight improvement in right basilar
airspace disease. Similar appearance of left basilar airspace
disease. Stable heart size and mediastinal contours. No pneumothorax
or evidence of pneumomediastinum. Suspected right pleural effusion
IMPRESSION: 1. Tracheostomy tube projects over the trachea at the level of the
clavicular heads, appropriately positioned on AP view.
2. Slight improvement in right basilar airspace disease. Similar
appearance of left basilar airspace disease. Similar right pleural
effusion.

## 2021-01-08 NOTE — Progress Notes (Signed)
NAME:  Luis Hunter, MRN:  [id number], DOB:  [birth year], LOS: 23 ADMISSION DATE:  12/16/2020, CONSULTATION DATE:  8/13 REFERRING MD:  Starleen Blue EDP [facility], CHIEF COMPLAINT:  Seizure   History of Present Illness:  45 y/o male found unresponsive after having multiple seizures.  Intubated for airway protection and loadded with Keppra.  UDS positive for cocaine and THC.  Transferred from [facility] to [facility] for continuous EEG.  Since admission he has had a LHC for an abnormal EKG, but this was felt to be due to severe LVH.  He has been treated with antibiotics for pneumonia.  He has had recurrent fevers for days.   Pertinent  Medical History  Schizophrenia, Cocaine abuse, Bipolar disorder, Depression  Significant Hospital Events: Including procedures, antibiotic start and stop dates in addition to other pertinent events   8/13 intubated, transferred to [facility] 8/20 vent dyssynchrony >> add precedex; EEG with periodic triphasic morphology and generalized slowing 8/23 remains encephalopathic, required escalation of sedatives for vent dyssynchrony  8/28 goals of care conversation: plan tracheostomy  8/29 dilantin stopped by neurology, tracheostomy 8/31 followed commands, stopped precedex but restarted overnight 9/03 changed to pressure control, start clonidine taper  Tubes/lines: 8/13 ETT > 8/29 8/29 tracheostomy >   Antibiotics 8/16 started on unasyn 8/17 unasyn discontinued 8/18 started on zosyn 8/25 started augmentin for sinusitis 8/28 Meropenem  Cultures 8/16 respiratory aspirate with gram positive cocci, few gram negative rods 8/26 resp culture > serratia   Procedures/imaging: 8/22 cardiac catheterization for STEMI-no significant coronary artery disease discovered 8/24 MRI-mastoid fullness, no intracerebral abnormality 8/27 on cEEG 8/29 RUQ ultrasound > hepatic hemangioma 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  Objective   Blood pressure (!) 135/97, pulse (!) 101, temperature 100.1 F (37.8 C), temperature source Oral, resp. rate (!) 29, height '5\' 11"'$  (1.803 m), weight 73 kg, SpO2 96 %.    Vent Mode: CPAP;PSV FiO2 (%):  [30 %] 30 % Set Rate:  [16 bmp] 16 bmp PEEP:  [5 cmH20] 5 cmH20 Pressure Support:  [10 [id number] cmH20] 10 cmH20 Plateau Pressure:  [18 cmH20] 18 cmH20   Intake/Output Summary (Last 24 hours) at 01/08/2021 0933 Last data filed at 01/08/2021 [id number] Gross per 24 hour  Intake 2205 ml  Output 3260 ml  Net -1055 ml   Filed Weights   01/06/21 0500 01/07/21 0500 01/08/21 0600  Weight: 76.4 kg 75 kg 73 kg    Examination:  General - sweaty Eyes - pupils reactive ENT - trach site clean Cardiac - regular rate/rhythm, no murmur Chest - scattered rhonchi Abdomen - soft, non tender, + bowel sounds Extremities - no cyanosis, clubbing, or edema Skin - no rashes Neuro - intermittent head and Rt arm tremor  Resolved Hospital Problem list   Hypernatremia  Assessment & Plan:   Acute metabolic encephalopathy. Polysubstance abuse with hx of depression, bipolar, and schizophrenia. Epilepsy. - initially felt to be post ictal.  now concern is for sepsis, hypoxia, and underlying mental health disorder - started clonidine taper on 9/03 to wean off precedex - continue klonopin 1 mg q8h - continue remeron, abilify - continue vimpat, keppra  Acute hypoxemic respiratory failure from HCAP. Presumptive diagnosis of asthma. 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 - changed to pressure control on 9/03 - pressure support to TC as able - f/u CXR intermittently - bronchial hygiene - goal SpO2 > 92% - continue pulmicort, brovana, yupelri with  prn albuterol  HCAP with Serratia and Enterobacter. - recurrent fever with increased WBC and respiratory secretions on 8/28 - completed ABx on 9/03  Hypertension. - goal SBP < 140, DBP < 95 - decreased dose of norvasc while on clonidine - continue  HCTZ, lopressor  Elevated LFTs from shock and hypoxia >> improving. Liver hemangioma. - Will need outpatient repeat imaging of hepatic hemangioma - f/u LFT intermittently  Dysphagia. - will likely need PEG >> if family agreeable, then need to plan for later this week  Disposition. - will likely need LTAC  Best Practice (right click and "Reselect all SmartList Selections" daily)   Diet/type: tubefeeds DVT prophylaxis: LMWH GI prophylaxis: PPI Lines: N/A Foley:  N/A Code Status:  full code Last date of multidisciplinary goals of care discussion [8/28]  Labs    CMP Latest Ref Rng & Units 01/07/2021 01/06/2021 01/05/2021  Glucose 70 - 99 mg/dL 125(H) 116(H) 130(H)  BUN 6 - 20 mg/dL '20 16 15  '$ Creatinine 0.61 - 1.24 mg/dL 0.66 0.65 0.78  Sodium 135 - 145 mmol/L 133(L) 135 137  Potassium 3.5 - 5.1 mmol/L 5.0 4.2 3.7  Chloride 98 - 111 mmol/L 98 100 99  CO2 22 - 32 mmol/L '27 28 28  '$ Calcium 8.9 - 10.3 mg/dL 8.4(L) 8.3(L) 8.3(L)  Total Protein 6.5 - 8.1 g/dL - 6.5 -  Total Bilirubin 0.3 - 1.2 mg/dL - 1.0 -  Alkaline Phos 38 - 126 U/L - 150(H) -  AST 15 - 41 U/L - 179(H) -  ALT 0 - 44 U/L - 379(H) -    CBC Latest Ref Rng & Units 01/07/2021 01/06/2021 01/05/2021  WBC 4.0 - 10.5 K/uL 18.5(H) 17.4(H) 17.9(H)  Hemoglobin 13.0 - 17.0 g/dL 9.0(L) 8.8(L) 9.1(L)  Hematocrit 39.0 - 52.0 % 27.3(L) 26.4(L) 27.6(L)  Platelets 150 - 400 K/uL 308 319 401(H)    CBG (last 3)  Recent Labs    01/07/21 2317 01/08/21 0315 01/08/21 0730  GLUCAP 114* 129* 120*    ABG    Component Value Date/Time   PHART 7.366 12/31/2020 2242   PCO2ART 59.2 (H) 12/31/2020 2242   PO2ART 100 12/31/2020 2242   HCO3 33.8 (H) 12/31/2020 2242   TCO2 36 (H) 12/31/2020 2242   ACIDBASEDEF 1.0 12/21/2020 1101   O2SAT 97.0 12/31/2020 2242    Signature:  Chesley Mires, MD [place] Pager - [phone] 01/08/2021, 9:33 AM

## 2021-01-08 NOTE — Progress Notes (Signed)
eLink Physician-Brief Progress Note Patient Name: Luis Hunter DOB: Nov 01, 1975 MRN: [id number]   Date of Service  01/08/2021  HPI/Events of Note  - having periods of tremor with drop in SpO2 and thick trach secretions - CXR unchanged - these episodes are not new according to chart notes - pt was given prn pain meds and on my eval he was alert and resting comfortably, although FiO2 needs were indeed higher  eICU Interventions  - asked RN to monitor for further SIRS signs (such as fever) - continue aggressive suctioning     Intervention Category Intermediate Interventions: Respiratory distress - evaluation and management  Tilden Dome 01/08/2021, 11:01 PM

## 2021-01-08 NOTE — Progress Notes (Signed)
Updated pt's family at bedside about current status and treatment plan.  They understand next step will be to determine LTACH placement and as part of this process he will need gastrostomy tube placement.  They are in agreement with this.  Will consult IR to arrange for percutaneous gastrostomy tube.  Chesley Mires, MD [place] Pager - [phone] 01/08/2021, 2:32 PM

## 2021-01-08 NOTE — Progress Notes (Signed)
Pt. presenting with frequent twitching/tremmors in facial and jaw area. Day shift confirmed that this is not a new symptom. E-link made aware.

## 2021-01-08 NOTE — Progress Notes (Signed)
Pt having episode of desat to upper 80s but not recovering to mid 90s as earlier.  At this time, pt has aggressively suction with copious amts os secretions removed.  Pt seems agitated and having tremors?  Pt shakes constantly and is sweating profusely.  Rt spoke with RN about obtaining cxr to check for changes.  Rt will continue to monitor.

## 2021-01-09 ENCOUNTER — Inpatient Hospital Stay (HOSPITAL_COMMUNITY): Payer: Medicare HMO

## 2021-01-09 DIAGNOSIS — J9602 Acute respiratory failure with hypercapnia: Secondary | ICD-10-CM | POA: Diagnosis not present

## 2021-01-09 LAB — CBC
HCT: 31.8 % — ABNORMAL LOW (ref 39.0–52.0)
Hemoglobin: 10.6 g/dL — ABNORMAL LOW (ref 13.0–17.0)
MCH: 28.8 pg (ref 26.0–34.0)
MCHC: 33.3 g/dL (ref 30.0–36.0)
MCV: 86.4 fL (ref 80.0–100.0)
Platelets: 358 10*3/uL (ref 150–400)
RBC: 3.68 MIL/uL — ABNORMAL LOW (ref 4.22–5.81)
RDW: 15 % (ref 11.5–15.5)
WBC: 23.5 10*3/uL — ABNORMAL HIGH (ref 4.0–10.5)
nRBC: 0 % (ref 0.0–0.2)

## 2021-01-09 LAB — CBC WITH DIFFERENTIAL/PLATELET
Abs Immature Granulocytes: 0 10*3/uL (ref 0.00–0.07)
Basophils Absolute: 0 10*3/uL (ref 0.0–0.1)
Basophils Relative: 0 %
Eosinophils Absolute: 1.4 10*3/uL — ABNORMAL HIGH (ref 0.0–0.5)
Eosinophils Relative: 6 %
HCT: 28.1 % — ABNORMAL LOW (ref 39.0–52.0)
Hemoglobin: 9.2 g/dL — ABNORMAL LOW (ref 13.0–17.0)
Lymphocytes Relative: 5 %
Lymphs Abs: 1.2 10*3/uL (ref 0.7–4.0)
MCH: 28.9 pg (ref 26.0–34.0)
MCHC: 32.7 g/dL (ref 30.0–36.0)
MCV: 88.4 fL (ref 80.0–100.0)
Monocytes Absolute: 1 10*3/uL (ref 0.1–1.0)
Monocytes Relative: 4 %
Neutro Abs: 20.4 10*3/uL — ABNORMAL HIGH (ref 1.7–7.7)
Neutrophils Relative %: 85 %
Platelets: 321 10*3/uL (ref 150–400)
RBC: 3.18 MIL/uL — ABNORMAL LOW (ref 4.22–5.81)
RDW: 15 % (ref 11.5–15.5)
WBC: 24 10*3/uL — ABNORMAL HIGH (ref 4.0–10.5)
nRBC: 0 /100 WBC
nRBC: 0.1 % (ref 0.0–0.2)

## 2021-01-09 LAB — COMPREHENSIVE METABOLIC PANEL
ALT: 427 U/L — ABNORMAL HIGH (ref 0–44)
AST: 140 U/L — ABNORMAL HIGH (ref 15–41)
Albumin: 2.2 g/dL — ABNORMAL LOW (ref 3.5–5.0)
Alkaline Phosphatase: 197 U/L — ABNORMAL HIGH (ref 38–126)
Anion gap: 9 (ref 5–15)
BUN: 26 mg/dL — ABNORMAL HIGH (ref 6–20)
CO2: 26 mmol/L (ref 22–32)
Calcium: 8.8 mg/dL — ABNORMAL LOW (ref 8.9–10.3)
Chloride: 95 mmol/L — ABNORMAL LOW (ref 98–111)
Creatinine, Ser: 0.56 mg/dL — ABNORMAL LOW (ref 0.61–1.24)
GFR, Estimated: >60 mL/min (ref >60)
Glucose, Bld: 128 mg/dL — ABNORMAL HIGH (ref 70–99)
Potassium: 4.6 mmol/L (ref 3.5–5.1)
Sodium: 130 mmol/L — ABNORMAL LOW (ref 135–145)
Total Bilirubin: 0.8 mg/dL (ref 0.3–1.2)
Total Protein: 8 g/dL (ref 6.5–8.1)

## 2021-01-09 LAB — GLUCOSE, CAPILLARY
Glucose-Capillary: 108 mg/dL — ABNORMAL HIGH (ref 70–99)
Glucose-Capillary: 120 mg/dL — ABNORMAL HIGH (ref 70–99)
Glucose-Capillary: 124 mg/dL — ABNORMAL HIGH (ref 70–99)
Glucose-Capillary: 120 mg/dL — ABNORMAL HIGH (ref 70–99)
Glucose-Capillary: 113 mg/dL — ABNORMAL HIGH (ref 70–99)
Glucose-Capillary: 110 mg/dL — ABNORMAL HIGH (ref 70–99)

## 2021-01-09 LAB — PROCALCITONIN: Procalcitonin: 0.12 ng/mL

## 2021-01-09 IMAGING — DX DG CHEST 1V PORT
1 series · 1 of 1 positions shown · non-contrast
Comparison: [DATE] and earlier.

CLINICAL DATA: 45-year-old male with respiratory failure.  STEMI.

EXAM:
PORTABLE CHEST 1 VIEW

[chest ap]
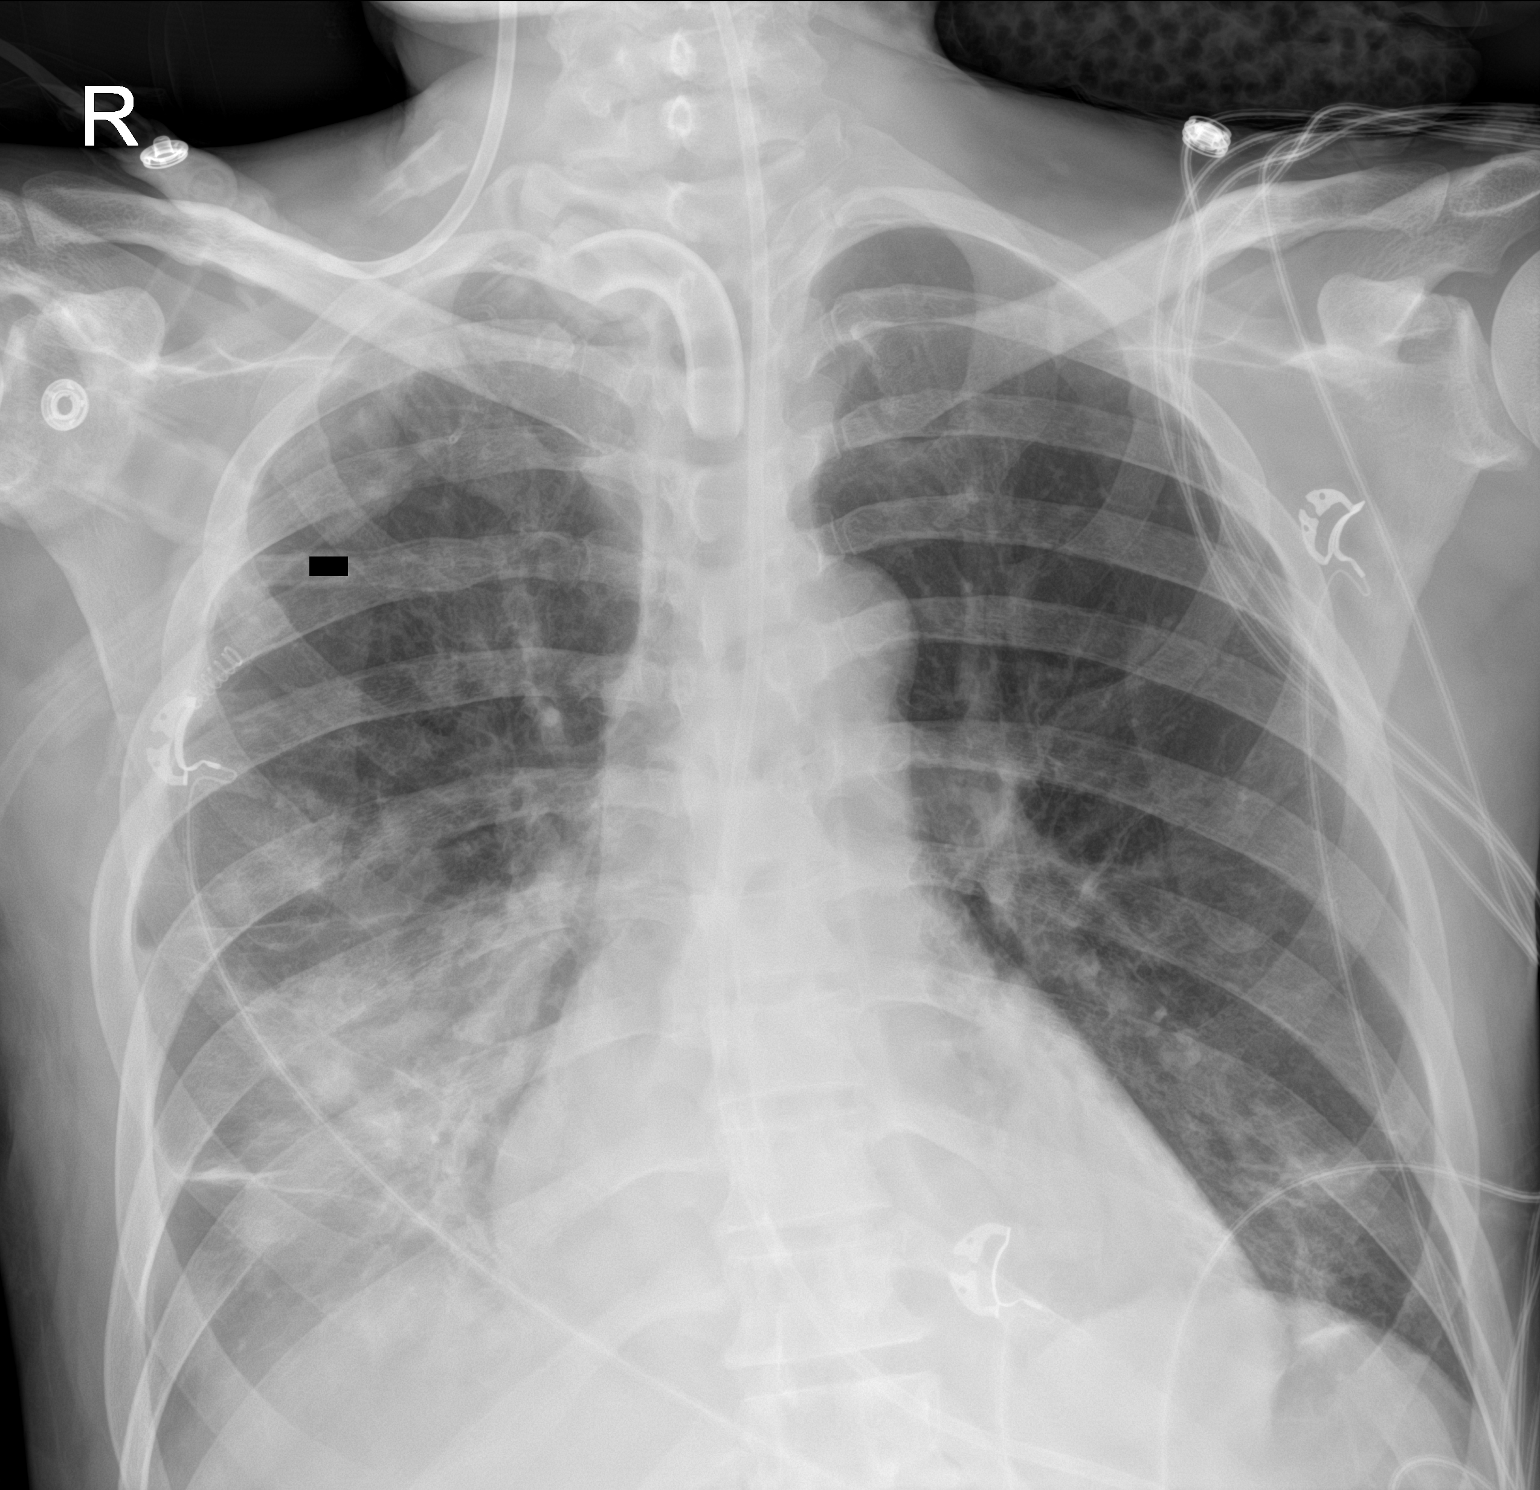

[1 of 1 positions shown; findings below may reference images not displayed]

FINDINGS: Portable AP view at [SK] hours. Stable tracheostomy and visible
enteric feeding tube. Stable lung volumes and mediastinal contours.
Confluent and veiling right lower lung opacity persists.
Retrocardiac air bronchograms and opacity on the left again noted.
No superimposed pneumothorax or pulmonary edema. Ventilation has not
significantly changed since [DATE].
IMPRESSION: 1. Stable lines and tubes.
2. Stable ventilation since [DATE] with suspected combined
pleural effusion and airspace opacity at the right lung base, and
left lower lobe collapse or consolidation.

## 2021-01-09 IMAGING — CT CT ABDOMEN W/O CM
2 of 4 series · 14 of 46 positions shown, 16 images · non-contrast
Comparison: None.

CLINICAL DATA: Dysphagia. Evaluate anatomy prior to potential
percutaneous gastrostomy tube placement.

EXAM:
CT ABDOMEN WITHOUT CONTRAST
TECHNIQUE: Multidetector CT imaging of the abdomen was performed following the
standard protocol without IV contrast.

[Series 3: a/p w/o 5mm · axial · non-contrast · 0.84mm/px · z∈[+1088,+1363]mm · 11 of 64 slices shown, 13 images]
[im 6/64  soft-tissue]
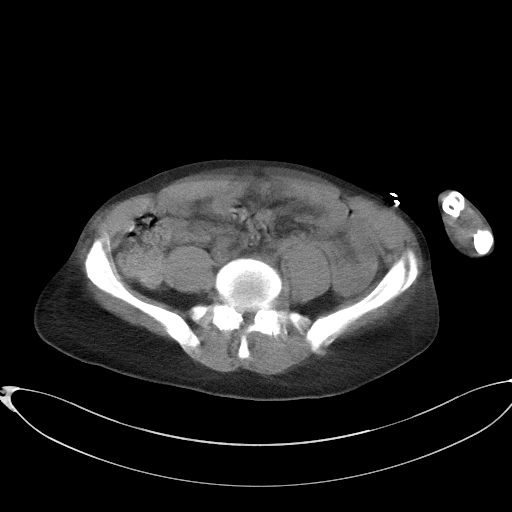
[im 6/64  bone]
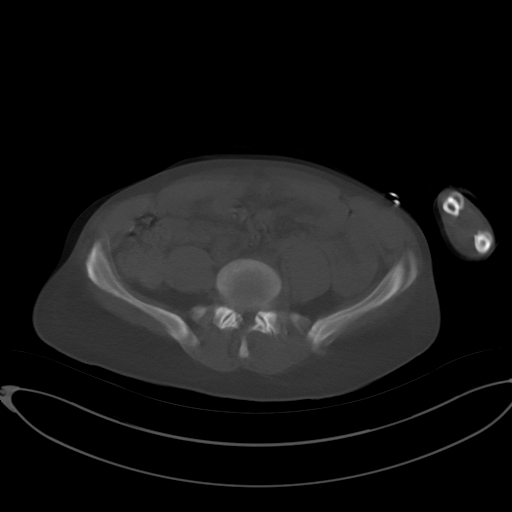
[im 11/64  soft-tissue]
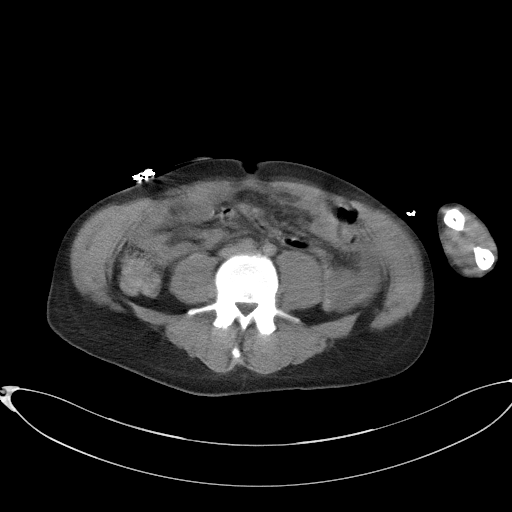
[im 17/64  soft-tissue]
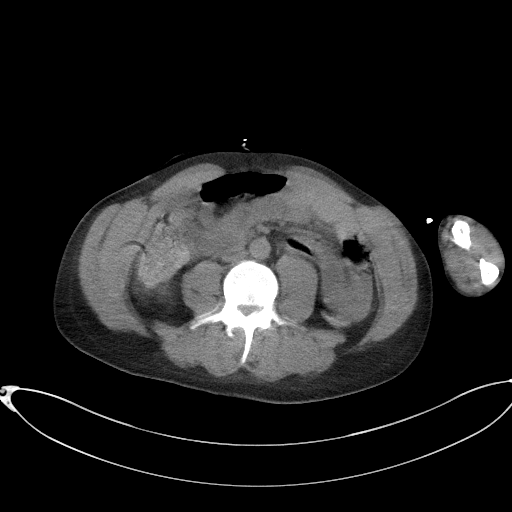
[im 22/64  soft-tissue]
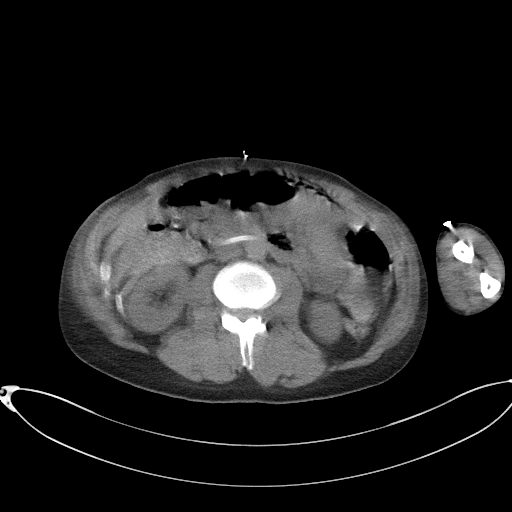
[im 28/64  soft-tissue]
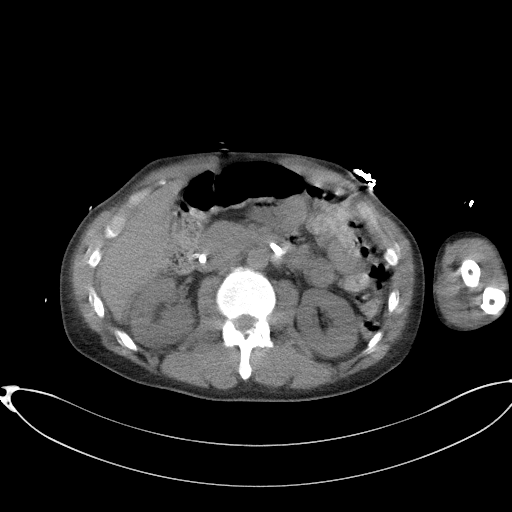
[im 33/64  soft-tissue]
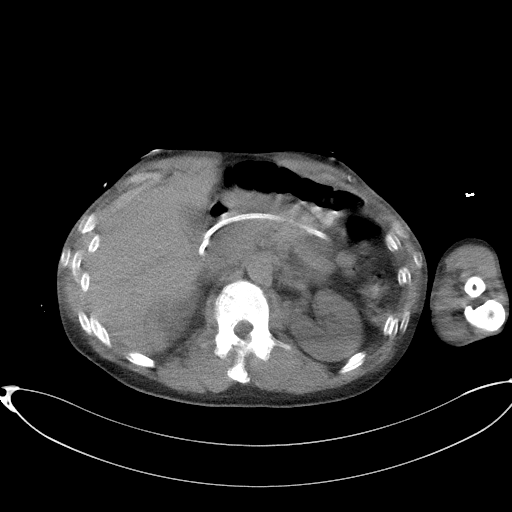
[im 39/64  soft-tissue]
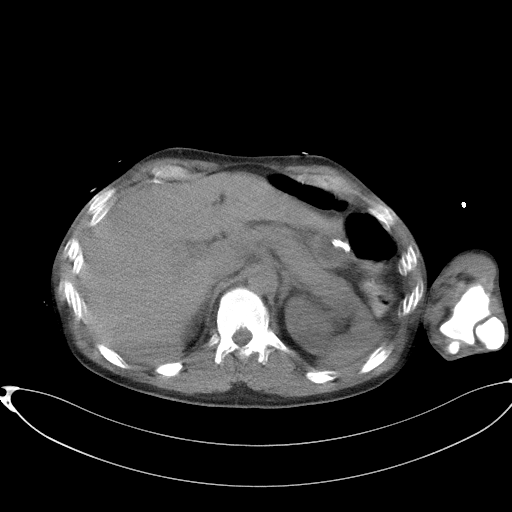
[im 44/64  soft-tissue]
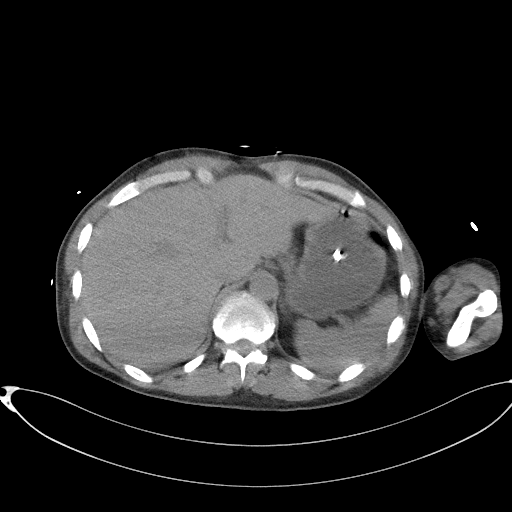
[im 50/64  soft-tissue]
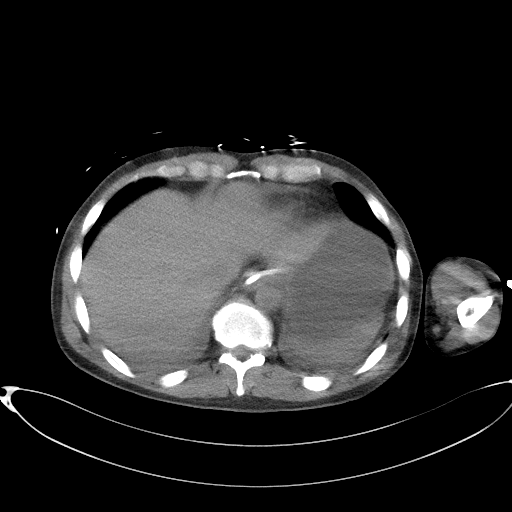
[im 50/64  bone]
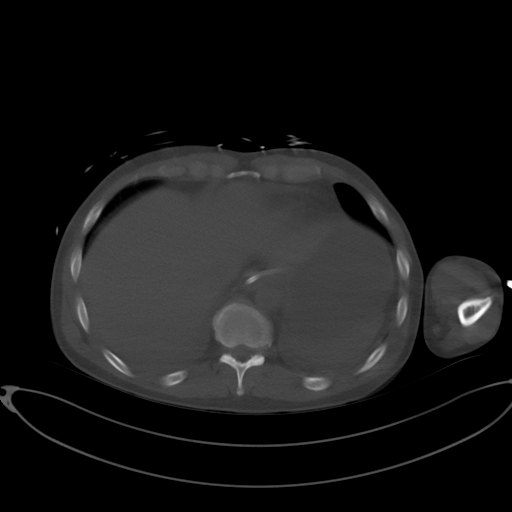
[im 55/64  soft-tissue]
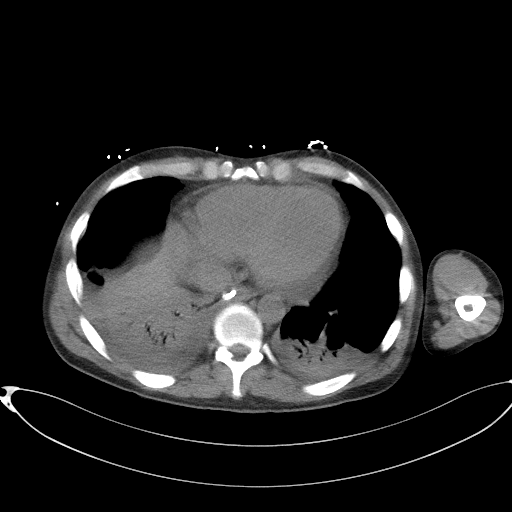
[im 61/64  soft-tissue]
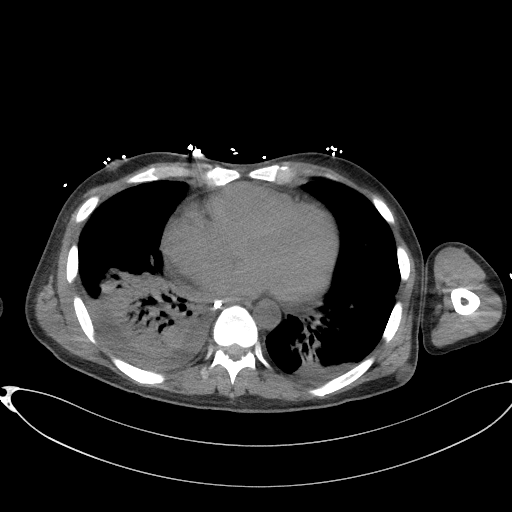

[Series 6: a/p w/o cor · coronal · non-contrast · 0.63mm/px · 3 of 150 slices shown]
[im 50/150  soft-tissue]
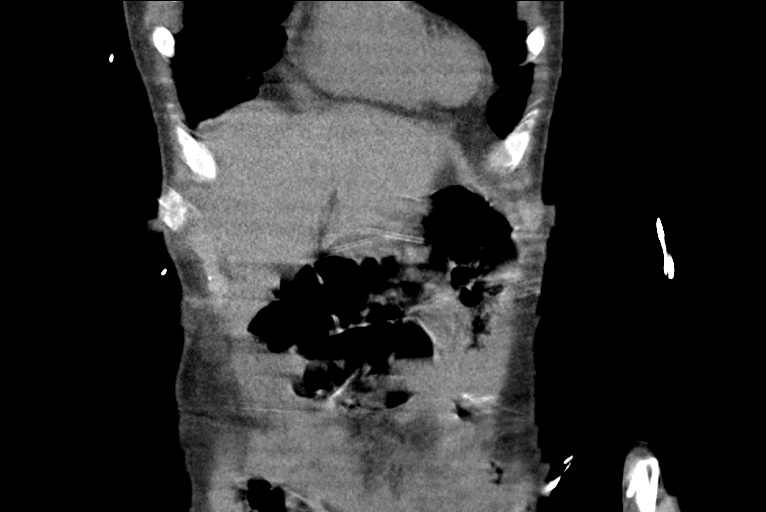
[im 67/150  soft-tissue]
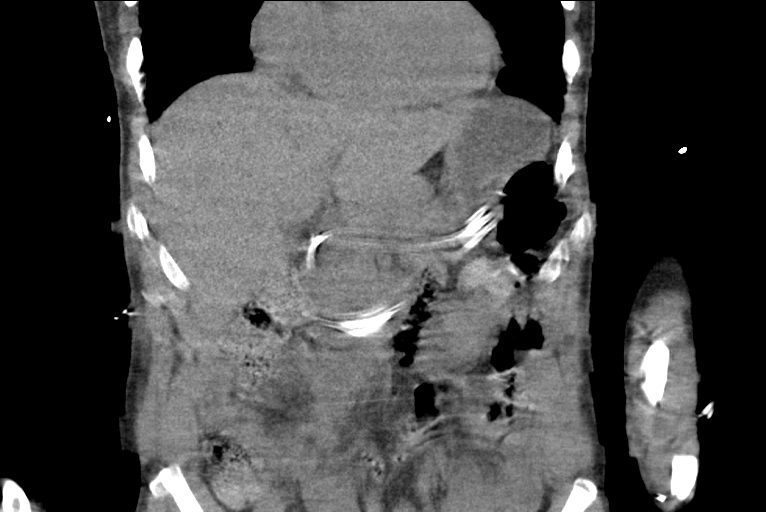
[im 83/150  soft-tissue]
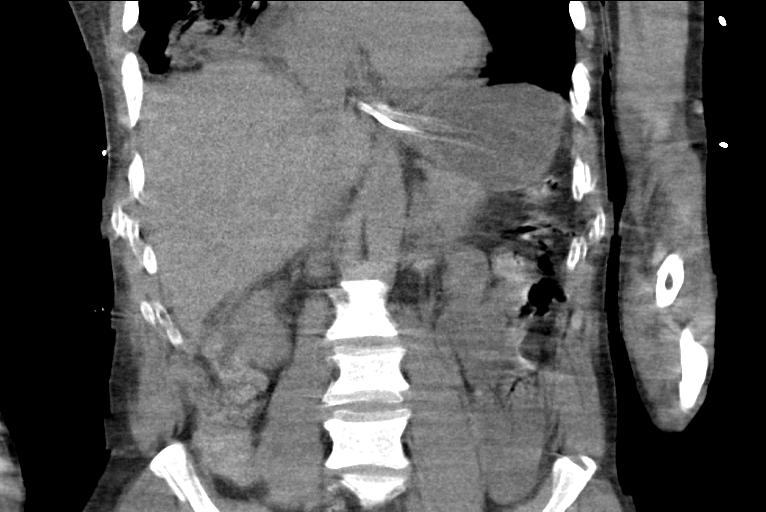

[14 of 46 positions shown; findings below may reference images not displayed]

FINDINGS: The lack of intravenous contrast limits the ability to evaluate
solid abdominal organs. Examination is further degraded secondary to
patient motion artifact as well as streak artifact from the
patient's overlying left upper extremity.

Lower chest: Limited visualization of the lower thorax demonstrates
a trace right-sided pleural effusion with bibasilar consolidative
opacities and air bronchograms, right greater than left.

Cardiomegaly.  Trace pericardial effusion.

Hepatobiliary: Normal hepatic contour. Normal noncontrast appearance
of the gallbladder given degree of distention. No radiopaque
gallstones. No ascites.

Pancreas: Normal noncontrast appearance of the pancreas.

Spleen: Normal noncontrast appearance of the spleen.

Adrenals/Urinary Tract: Normal noncontrast appearance of the
bilateral kidneys given significant patient motion artifact. No
renal stones. No urinary obstruction or perinephric stranding.

Normal noncontrast appearance of the bilateral adrenal glands.

The urinary bladder was not imaged.

Stomach/Bowel: Enteric tip terminates within the ascending portion
of the duodenum. The transverse colon is interposed between the
anterior wall the stomach and ventral wall of the upper abdomen
though the percutaneous window may be improved with gastric
insufflation. No definite evidence of enteric obstruction. No
pneumoperitoneum, pneumatosis or portal venous gas.

Vascular/Lymphatic: Normal caliber of the abdominal aorta.

No bulky retroperitoneal or mesenteric lymphadenopathy on this
motion degraded noncontrast examination.

Other: Regional soft tissues appear normal.

Musculoskeletal: No definite acute or aggressive osseous
abnormalities given significant patient motion artifact.
IMPRESSION: 1. Suboptimal percutaneous window for gastrostomy tube placement
secondary to interposition of the transverse colon between the
anterior wall of the stomach and ventral wall of the upper abdomen
however the percutaneous window may be improved with gastric
insufflation. As such, would recommend patient received enteric
contrast the night before planned percutaneous gastrostomy tube
placement.
2. Cardiomegaly with small pericardial effusion. Further evaluation
cardiac echo could be performed as indicated.
3. Small right-sided pleural effusion with bibasilar
heterogeneous/consolidative opacities and associated air
bronchograms, right greater than left, atelectasis versus
infiltrate/aspiration.

## 2021-01-09 MED ORDER — CLONAZEPAM 0.5 MG PO TBDP
0.5000 mg | ORAL_TABLET | Freq: Three times a day (TID) | ORAL | Status: DC
  Administered 2021-01-09 – 2021-01-10 (×4): 0.5 mg
  Filled 2021-01-09 (×5): qty 1

## 2021-01-09 NOTE — Progress Notes (Signed)
eLink Physician-Brief Progress Note Patient Name: Luis Hunter DOB: 06/08/1975 MRN: [id number]   Date of Service  01/09/2021  HPI/Events of Note  Sodium low  eICU Interventions  Free water flushes held     Intervention Category Minor Interventions: Electrolytes abnormality - evaluation and management  Tilden Dome 01/09/2021, 6:02 AM

## 2021-01-09 NOTE — Progress Notes (Signed)
RT assisted with patient transport to CT and back to ICU with no complications. Patient left in full support upon arrival back to room due to WOB, will rassess for readiness for PSV once patient has recovered from transport.

## 2021-01-09 NOTE — Consult Note (Signed)
Chief Complaint: Patient was seen in consultation today for evaluation of PEG placement at the request of Dr. Chesley Mires  Supervising Physician: Corrie Mckusick  Patient Status: [facility] - In-pt  History of Present Illness: LORD BADLEY is a 45 y.o. male who was found unresponsive after having multiple seizures.  He has been intubated and converted to trach.  He has been running fevers and is being treated for pneumonia.  He remains vent dependent and encephalopathic.  Plan of care includes LTACH and gastrostomy tube  Past Medical History:  Diagnosis Date   Bipolar disorder ([place])    Depression    Schizophrenia ([place])    Seizures ([place])     Past Surgical History:  Procedure Laterality Date   FINGER SURGERY     LEFT HEART CATH AND CORONARY ANGIOGRAPHY N/A 12/25/2020   Procedure: LEFT HEART CATH AND CORONARY ANGIOGRAPHY;  Surgeon: Nelva Bush, MD;  Location: [place] [facility];  Service: Cardiovascular;  Laterality: N/A;    Allergies: Patient has no known allergies.  Medications: Prior to Admission medications   Medication Sig Start Date End Date Taking? Authorizing Provider  ARIPiprazole ER (ABILIFY MAINTENA) 400 MG PRSY prefilled syringe Inject 400 mg as directed every 30 (thirty) days.  11/08/15  Yes [provider]  benztropine (COGENTIN) 0.5 MG tablet Take 0.5 mg by mouth 2 (two) times daily. 10/23/20  Yes [provider]  levETIRAcetam (KEPPRA) 750 MG tablet Take 2 tablets (1,500 mg total) by mouth 2 (two) times daily. 10/05/20 01/03/21 Yes Duffy Bruce, MD     No family history on file.  Social History   Socioeconomic History   Marital status: Single    Spouse name: Not on file   Number of children: Not on file   Years of education: Not on file   Highest education level: Not on file  Occupational History   Not on file  Tobacco Use   Smoking status: Every Day   Smokeless tobacco: Never  Substance and Sexual Activity   Alcohol use: No     Alcohol/week: 0.0 standard drinks   Drug use: Yes    Types: Marijuana, Cocaine   Sexual activity: Not Currently  Other Topics Concern   Not on file  Social History Narrative   Not on file   Social Determinants of Health   Financial Resource Strain: Not on file  Food Insecurity: Not on file  Transportation Needs: Not on file  Physical Activity: Not on file  Stress: Not on file  Social Connections: Not on file    Review of Systems: Unobtainable   Vital Signs: BP 98/62   Pulse 90   Temp 98.5 F (36.9 C) (Axillary)   Resp 16   Ht '5\' 11"'$  (1.803 m)   Wt 164 lb 14.5 oz (74.8 kg)   SpO2 98%   BMI 23.00 kg/m   Physical Exam Vitals reviewed.  Constitutional:      Comments: Opens eyes when provider walks into room and begins to speak, otherwise unresponsive  HENT:     Head: Normocephalic.  Cardiovascular:     Rate and Rhythm: Normal rate and regular rhythm.     Heart sounds: Normal heart sounds.  Pulmonary:     Comments: Wet expiratory breath sounds Abdominal:     General: Abdomen is flat. There is no distension.     Palpations: Abdomen is soft.     Hernia: No hernia is present.  Skin:    General: Skin is warm and  dry.    Imaging: CT ABDOMEN WO CONTRAST  Result Date: 01/09/2021 CLINICAL DATA:  Dysphagia. Evaluate anatomy prior to potential percutaneous gastrostomy tube placement. EXAM: CT ABDOMEN WITHOUT CONTRAST TECHNIQUE: Multidetector CT imaging of the abdomen was performed following the standard protocol without IV contrast. COMPARISON:  None. FINDINGS: The lack of intravenous contrast limits the ability to evaluate solid abdominal organs. Examination is further degraded secondary to patient motion artifact as well as streak artifact from the patient's overlying left upper extremity. Lower chest: Limited visualization of the lower thorax demonstrates a trace right-sided pleural effusion with bibasilar consolidative opacities and air bronchograms, right greater than  left. Cardiomegaly.  Trace pericardial effusion. Hepatobiliary: Normal hepatic contour. Normal noncontrast appearance of the gallbladder given degree of distention. No radiopaque gallstones. No ascites. Pancreas: Normal noncontrast appearance of the pancreas. Spleen: Normal noncontrast appearance of the spleen. Adrenals/Urinary Tract: Normal noncontrast appearance of the bilateral kidneys given significant patient motion artifact. No renal stones. No urinary obstruction or perinephric stranding. Normal noncontrast appearance of the bilateral adrenal glands. The urinary bladder was not imaged. Stomach/Bowel: Enteric tip terminates within the ascending portion of the duodenum. The transverse colon is interposed between the anterior wall the stomach and ventral wall of the upper abdomen though the percutaneous window may be improved with gastric insufflation. No definite evidence of enteric obstruction. No pneumoperitoneum, pneumatosis or portal venous gas. Vascular/Lymphatic: Normal caliber of the abdominal aorta. No bulky retroperitoneal or mesenteric lymphadenopathy on this motion degraded noncontrast examination. Other: Regional soft tissues appear normal. Musculoskeletal: No definite acute or aggressive osseous abnormalities given significant patient motion artifact. IMPRESSION: 1. Suboptimal percutaneous window for gastrostomy tube placement secondary to interposition of the transverse colon between the anterior wall of the stomach and ventral wall of the upper abdomen however the percutaneous window may be improved with gastric insufflation. As such, would recommend patient received enteric contrast the night before planned percutaneous gastrostomy tube placement. 2. Cardiomegaly with small pericardial effusion. Further evaluation cardiac echo could be performed as indicated. 3. Small right-sided pleural effusion with bibasilar heterogeneous/consolidative opacities and associated air bronchograms, right greater  than left, atelectasis versus infiltrate/aspiration. Electronically Signed   By: Sandi Mariscal M.D.   On: 01/09/2021 10:39   DG Chest 1 View  Result Date: 12/31/2020 CLINICAL DATA:  Respiratory failure. EXAM: CHEST  1 VIEW COMPARISON:  Chest radiograph dated 12/30/2020. FINDINGS: Endotracheal tube above the carina in similar position. Feeding tube extends below the diaphragm with tip beyond the inferior margin of the image. Interval progression of right lung base opacity and development of a small right pleural effusion. Similar appearance of left lung base atelectasis or infiltrate. No pneumothorax. Stable cardiac silhouette. No acute osseous pathology. IMPRESSION: Interval progression of right lung base opacity and development of a small right pleural effusion. Similar appearance of left lung base atelectasis or infiltrate. Electronically Signed   By: Anner Crete M.D.   On: 12/31/2020 23:07   CT HEAD WO CONTRAST (5MM)  Result Date: 01/03/2021 CLINICAL DATA:  Acute neurologic deficit EXAM: CT HEAD WITHOUT CONTRAST TECHNIQUE: Contiguous axial images were obtained from the base of the skull through the vertex without intravenous contrast. COMPARISON:  None. FINDINGS: Brain: There is no mass, hemorrhage or extra-axial collection. The size and configuration of the ventricles and extra-axial CSF spaces are normal. The brain parenchyma is normal, without acute or chronic infarction. Vascular: No abnormal hyperdensity of the major intracranial arteries or dural venous sinuses. No intracranial atherosclerosis. Skull: The visualized skull  base, calvarium and extracranial soft tissues are normal. Sinuses/Orbits: No fluid levels or advanced mucosal thickening of the visualized paranasal sinuses. No mastoid or middle ear effusion. The orbits are normal. IMPRESSION: Normal head CT. Electronically Signed   By: Ulyses Jarred M.D.   On: 01/03/2021 22:16   CT HEAD WO CONTRAST (5MM)  Result Date: 12/16/2020 CLINICAL  DATA:  Pt began having seizure like activity- called charge and asked her to notify MDSeizure, abnormal neuro exam EXAM: CT HEAD WITHOUT CONTRAST TECHNIQUE: Contiguous axial images were obtained from the base of the skull through the vertex without intravenous contrast. COMPARISON:  Head CT 11/19/2019 FINDINGS: Brain: No acute intracranial hemorrhage. No focal mass lesion. No CT evidence of acute infarction. No midline shift or mass effect. No hydrocephalus. Basilar cisterns are patent. Vascular: No hyperdense vessel or unexpected calcification. Skull: Normal. Negative for fracture or focal lesion. Sinuses/Orbits: Mucosal thickening in the maxillary sinus. NG tube noted. Other: Intubated patient with some skull artifact due to nonstandard positioning. IMPRESSION: No acute intracranial findings. Intubated patient with skull base artifact. Electronically Signed   By: Suzy Bouchard M.D.   On: 12/16/2020 13:16   MR BRAIN WO CONTRAST  Result Date: 12/27/2020 CLINICAL DATA:  Mental status change of unknown cause. Unresponsive. EXAM: MRI HEAD WITHOUT CONTRAST TECHNIQUE: Multiplanar, multiecho pulse sequences of the brain and surrounding structures were obtained without intravenous contrast. COMPARISON:  MRI 12/22/2020.  CT 12/16/2020. FINDINGS: Brain: Diffusion imaging does not show any acute or subacute infarction or other cause of restricted diffusion. The brainstem and cerebellum are normal. Cerebral hemispheres show minimal small vessel change of the white matter but no other abnormal finding. Increased FLAIR signal within the dependent CSF is often seen in patients with mechanical ventilation. No mass, hemorrhage, hydrocephalus or extra-axial collection. Vascular: Major vessels at the base of the brain show flow. Skull and upper cervical spine: Negative Sinuses/Orbits: Considerable inflammatory changes throughout paranasal sinuses. Orbits negative. Other: Bilateral mastoid effusions. IMPRESSION: No acute or  focal intracranial finding. Minimal small vessel change of the hemispheric white matter. Mild increase in FLAIR signal within the dependent CSF, often seen in the setting of mechanical ventilation. Inflammatory changes of the paranasal sinuses. Bilateral mastoid effusions. Electronically Signed   By: Nelson Chimes M.D.   On: 12/27/2020 14:15   MR BRAIN W WO CONTRAST  Result Date: 12/22/2020 CLINICAL DATA:  Seizure EXAM: MRI HEAD WITHOUT AND WITH CONTRAST TECHNIQUE: Multiplanar, multiecho pulse sequences of the brain and surrounding structures were obtained without and with intravenous contrast. CONTRAST:  50m GADAVIST GADOBUTROL 1 MMOL/ML IV SOLN COMPARISON:  CT head 12/16/2020 FINDINGS: Brain: There is no evidence of acute intracranial hemorrhage, extra-axial fluid collection, or infarct. There is no parenchymal signal abnormality. The ventricles are not enlarged. There is no midline shift. No mass lesion is identified. There is no abnormal enhancement. The hippocampal formations are normal and symmetric. There is no structural or migrational abnormality. Vascular: Normal flow voids. Skull and upper cervical spine: Normal marrow signal. Sinuses/Orbits: There is complete opacification of the right maxillary sinus with mucosal thickening throughout the remainder of the paranasal sinuses. The globes and orbits are unremarkable. Other: Endotracheal and enteric catheters are noted. There are bilateral mastoid effusions likely related to instrumentation. IMPRESSION: No acute intracranial pathology or epileptogenic focus identified. Electronically Signed   By: PValetta MoleM.D.   On: 12/22/2020 15:33   CARDIAC CATHETERIZATION  Result Date: 12/25/2020 Conclusions: No angiographically significant coronary artery disease.  ST elevations on  EKG most likely represent abnormal repolarization in the setting of tachycardia. Normal left ventricular systolic function with upper normal filling pressure. Recommendations:  Return to [facility]-ICU for ongoing management. Primary prevention of coronary artery disease. Follow-up echocardiogram. Nelva Bush, MD [facility]  DG Chest Port 1 View  Result Date: 01/09/2021 CLINICAL DATA:  45 year old male with respiratory failure.  STEMI. EXAM: PORTABLE CHEST 1 VIEW COMPARISON:  01/08/2021 and earlier. FINDINGS: Portable AP view at 0444 hours. Stable tracheostomy and visible enteric feeding tube. Stable lung volumes and mediastinal contours. Confluent and veiling right lower lung opacity persists. Retrocardiac air bronchograms and opacity on the left again noted. No superimposed pneumothorax or pulmonary edema. Ventilation has not significantly changed since 01/04/2021. IMPRESSION: 1. Stable lines and tubes. 2. Stable ventilation since 01/04/2021 with suspected combined pleural effusion and airspace opacity at the right lung base, and left lower lobe collapse or consolidation. Electronically Signed   By: Genevie Ann M.D.   On: 01/09/2021 07:31   DG CHEST PORT 1 VIEW  Result Date: 01/08/2021 CLINICAL DATA:  Trachea displaced.  Tracking for trach displacement. EXAM: PORTABLE CHEST 1 VIEW COMPARISON:  Radiograph yesterday. FINDINGS: The tracheostomy tube projects over the trachea at the level of the clavicular heads. This appears normally positioned on AP imaging. Enteric tube remains in place. Slight improvement in right basilar airspace disease. Similar appearance of left basilar airspace disease. Stable heart size and mediastinal contours. No pneumothorax or evidence of pneumomediastinum. Suspected right pleural effusion IMPRESSION: 1. Tracheostomy tube projects over the trachea at the level of the clavicular heads, appropriately positioned on AP view. 2. Slight improvement in right basilar airspace disease. Similar appearance of left basilar airspace disease. Similar right pleural effusion. Electronically Signed   By: Keith Rake M.D.   On: 01/08/2021 22:58   DG Chest Port 1  View  Result Date: 01/07/2021 CLINICAL DATA:  Healthcare associated pneumonia EXAM: PORTABLE CHEST 1 VIEW COMPARISON:  01/05/2021 chest radiograph. FINDINGS: Tracheostomy tube tip overlies the tracheal air column just below the thoracic inlet. Enteric tube enters stomach with the tip not seen on this image. Stable cardiomediastinal silhouette with mild cardiomegaly. No pneumothorax. Small right pleural effusion is stable. No left pleural effusion. Hazy patchy mid to lower right lung and left lung base opacities, similar on the right and slightly worsened on the left. IMPRESSION: 1. Hazy patchy mid to lower right lung and left lung base opacities, similar on the right and slightly worsened on the left, compatible with multifocal pneumonia. 2. Stable small right pleural effusion. Electronically Signed   By: Ilona Sorrel M.D.   On: 01/07/2021 07:40   DG CHEST PORT 1 VIEW  Result Date: 01/05/2021 CLINICAL DATA:  Acute respiratory failure EXAM: PORTABLE CHEST 1 VIEW COMPARISON:  Chest radiograph 01/04/2021 FINDINGS: The tracheostomy tube is stable. The enteric catheter tip is off field of view. The heart is enlarged, unchanged. The mediastinal contours are stable. There is a persistent right pleural effusion with hazy opacity throughout the right mid and lower lung. The right upper lung remains well aerated. The left lung is clear. There is no left pleural effusion. There is no pneumothorax. The bones are stable. IMPRESSION: Unchanged right pleural effusion with adjacent airspace disease. Electronically Signed   By: Valetta Mole M.D.   On: 01/05/2021 07:31   DG CHEST PORT 1 VIEW  Result Date: 01/04/2021 CLINICAL DATA:  Follow-up CHF EXAM: PORTABLE CHEST 1 VIEW COMPARISON:  01/02/2021 FINDINGS: Cardiomegaly. Tracheostomy. Unchanged layering right pleural effusion  associated atelectasis or consolidation. The left lung is normally aerated. Partially imaged enteric feeding tube. The visualized skeletal structures are  unremarkable. IMPRESSION: 1.  Tracheostomy. 2. Unchanged layering right pleural effusion associated atelectasis or consolidation. The left lung is normally aerated. 3.  Cardiomegaly. Electronically Signed   By: Eddie Candle M.D.   On: 01/04/2021 10:52   DG Chest Port 1 View  Result Date: 01/02/2021 CLINICAL DATA:  Respiratory failure EXAM: PORTABLE CHEST 1 VIEW COMPARISON:  01/01/2021 FINDINGS: Tracheostomy tube and feeding tube remain in place. Stable cardiomegaly. Small right pleural effusion with patchy airspace opacities within the right mid to lower lung field. Mild streaky left basilar opacity. No pneumothorax. IMPRESSION: Stable radiographic appearance of the chest including small right pleural effusion with patchy right lower lobe opacity. Electronically Signed   By: Davina Poke D.O.   On: 01/02/2021 08:19   DG Chest Port 1 View  Result Date: 01/01/2021 CLINICAL DATA:  Status post tracheostomy. EXAM: PORTABLE CHEST 1 VIEW COMPARISON:  Radiographs 12/31/2020 and 12/30/2020. FINDINGS: 1403 hours. Two views submitted. Interval tracheostomy. The tracheostomy tube appears well positioned. Feeding tube projects below the diaphragm, tip not visualized. A previously noted right IJ sheath is no longer seen. The heart size and mediastinal contours are stable. Small right pleural effusion and right-greater-than-left basilar airspace opacities are grossly unchanged. There is no pneumothorax. IMPRESSION: 1. Satisfactory position of the tracheostomy tube.  No pneumothorax. 2. No significant change in right-greater-than-left basilar airspace opacities and small right pleural effusion. Electronically Signed   By: Richardean Sale M.D.   On: 01/01/2021 14:31   DG CHEST PORT 1 VIEW  Result Date: 12/30/2020 CLINICAL DATA:  Acute respiratory failure. EXAM: PORTABLE CHEST 1 VIEW COMPARISON:  12/28/2020. FINDINGS: ET tube tip is stable. There is a feeding tube with tip well below the GE junction. Stable  cardiomediastinal contours. Persistent airspace consolidation and atelectasis within right lower lung is unchanged from previous exam. Mild patchy opacities within the left lower lobe are unchanged. IMPRESSION: 1. Persistent right lower lung opacification which, likely represents a combination right middle lobe atelectasis and right lower lobe airspace disease. 2. Stable support apparatus. Electronically Signed   By: Kerby Moors M.D.   On: 12/30/2020 08:31   DG CHEST PORT 1 VIEW  Result Date: 12/28/2020 CLINICAL DATA:  Increased oxygen requirement, respiratory distress EXAM: PORTABLE CHEST 1 VIEW COMPARISON:  12/27/2020 FINDINGS: Endotracheal tube is unchanged. Patchy opacity in the right lower lobe, improved since prior study improving left basilar opacity. No effusions. Cardiomegaly. IMPRESSION: Improving bibasilar airspace opacities with continued mild patchy right lower lobe infiltrate. Cardiomegaly. Electronically Signed   By: Rolm Baptise M.D.   On: 12/28/2020 14:54   DG Chest Port 1 View  Result Date: 12/27/2020 CLINICAL DATA:  Respiratory failure. EXAM: PORTABLE CHEST 1 VIEW COMPARISON:  December 25, 2020. FINDINGS: Stable cardiomediastinal silhouette. Endotracheal nasogastric tubes are unchanged in position. No pneumothorax is noted. Stable bibasilar opacities are noted, right greater than left, concerning for pneumonia. Bony thorax is unremarkable. IMPRESSION: Stable support apparatus. Stable bibasilar opacities as described above. Electronically Signed   By: Marijo Conception M.D.   On: 12/27/2020 08:22   DG Chest Port 1 View  Result Date: 12/25/2020 CLINICAL DATA:  Respiratory failure EXAM: PORTABLE CHEST 1 VIEW COMPARISON:  12/24/2020 FINDINGS: Endotracheal tube tip 4 cm above the carina. Orogastric or nasogastric tube enters the stomach. Widespread patchy bilateral infiltrates consistent with pneumonia appear similar allowing for technical differences. Question small areas  of cavitation,  particularly on the right. No worsening or new finding since yesterday. IMPRESSION: Persistent bilateral pulmonary infiltrates, lower lobe predominant. No worsening or new finding since yesterday. Electronically Signed   By: Nelson Chimes M.D.   On: 12/25/2020 07:51   DG Chest Port 1 View  Result Date: 12/24/2020 CLINICAL DATA:  Acute respiratory failure. Endotracheally intubated. EXAM: PORTABLE CHEST 1 VIEW COMPARISON:  12/24/2020 FINDINGS: The endotracheal tube and nasogastric tube remain in appropriate position. The left jugular central venous catheter has been removed since previous study. No evidence of pneumothorax. Heart size is stable. Airspace disease is again seen in both lower lungs, right side greater than left, without significant change. IMPRESSION: No significant change in bilateral lower lung airspace disease, right side greater than left. Electronically Signed   By: Marlaine Hind M.D.   On: 12/24/2020 15:23   DG Chest Port 1 View  Result Date: 12/24/2020 CLINICAL DATA:  Respiratory failure EXAM: PORTABLE CHEST 1 VIEW COMPARISON:  12/20/2020 FINDINGS: Endotracheal tube terminates 3.7 cm above carina. Nasogastric tube is looped in the stomach with the tip in the proximal stomach. Left internal jugular line tip at mid SVC. mild cardiomegaly. No pleural effusion or pneumothorax. Right greater than left base airspace disease is mildly progressive. Developing inferior right upper lobe airspace disease as well. IMPRESSION: Worsening aeration, favoring progressive right greater than left pneumonia or aspiration. Nasogastric tube looped with tip in proximal stomach. Consider advancement. Electronically Signed   By: Abigail Miyamoto M.D.   On: 12/24/2020 11:48   DG CHEST PORT 1 VIEW  Result Date: 12/20/2020 CLINICAL DATA:  Central line placement EXAM: PORTABLE CHEST 1 VIEW COMPARISON:  Radiograph 12/20/2020 FINDINGS: Endotracheal tube overlies the upper trachea. Nasogastric tube tip overlies the  stomach. There is a left approach central venous catheter with tip overlying the mid superior vena cava. Unchanged cardiomediastinal silhouette. There is persistent bilateral lower lung airspace disease, right greater than left. No large pleural effusion or visible pneumothorax. No acute osseous abnormality. IMPRESSION: Left approach central venous catheter tip overlies the mid superior vena cava. Persistent lower lung airspace disease bilaterally, right greater than left. Electronically Signed   By: Maurine Simmering M.D.   On: 12/20/2020 15:38   DG CHEST PORT 1 VIEW  Result Date: 12/20/2020 CLINICAL DATA:  Acute respiratory failure with hypercapnia EXAM: PORTABLE CHEST 1 VIEW COMPARISON:  Portable exam 0832 hours compared to 12/18/2020 FINDINGS: Tip of endotracheal tube projects 8.0 cm above carina. Nasogastric tube coiled in proximal stomach. Normal heart size, mediastinal contours, and pulmonary vascularity. BILATERAL lower lobe airspace consolidation greater on RIGHT consistent with either pneumonia or aspiration. Upper lungs clear. No pleural effusion or pneumothorax. IMPRESSION: BILATERAL lower lobe infiltrates greater on RIGHT either representing pneumonia or aspiration. Electronically Signed   By: Lavonia Dana M.D.   On: 12/20/2020 10:58   DG CHEST PORT 1 VIEW  Result Date: 12/18/2020 CLINICAL DATA:  Respiratory failure EXAM: PORTABLE CHEST 1 VIEW COMPARISON:  12/16/2020 FINDINGS: Endotracheal tube terminates 5 cm above the carina. Enteric tube terminates in the proximal stomach. Lungs are clear.  No pleural effusion or pneumothorax. Heart is normal in size. IMPRESSION: Endotracheal tube terminates 5 cm above the carina. No evidence of acute cardiopulmonary disease. Electronically Signed   By: Julian Hy M.D.   On: 12/18/2020 03:04   DG Chest Portable 1 View  Result Date: 12/16/2020 CLINICAL DATA:  Endotracheal tube and OG placement. EXAM: PORTABLE CHEST 1 VIEW COMPARISON:  Chest x-ray from  earlier same day. FINDINGS: Endotracheal tube now appears well positioned with tip approximately 3 cm above the level of the carina. Enteric tube passes below the diaphragm. Lungs are clear. No pleural effusion or pneumothorax is seen. Heart size is stable. IMPRESSION: 1. Endotracheal tube now appears well positioned with tip approximately 3 cm above the level of the carina. 2. Enteric tube passes below the diaphragm. 3. Lungs are clear. Electronically Signed   By: Franki Cabot M.D.   On: 12/16/2020 12:15   DG Chest Portable 1 View  Result Date: 12/16/2020 CLINICAL DATA:  Endotracheal tube placement EXAM: PORTABLE CHEST 1 VIEW COMPARISON:  Chest x-ray dated 11/19/2019 FINDINGS: Endotracheal tube is positioned with tip at the level of the clavicles, approximately 5 cm above the carina. Heart size is stable. Lungs are clear. No pleural effusion or pneumothorax is seen. Osseous structures about the chest are unremarkable. IMPRESSION: 1. Endotracheal tube tip at the level of the clavicles, approximately 5 cm above the carina. 2. Lungs are clear. Electronically Signed   By: Franki Cabot M.D.   On: 12/16/2020 12:14   DG Abd Portable 1V  Result Date: 01/07/2021 CLINICAL DATA:  Constipation. EXAM: PORTABLE ABDOMEN - 1 VIEW COMPARISON:  Prior abdominal radiographs. FINDINGS: Divided supine views of the abdomen obtained. Weighted enteric tube in place with tip in the region of the duodenal jejunal junction. Generalized paucity of small bowel gas. There is air within nondistended ascending, transverse, and sigmoid colon. No significant formed stool is seen. Calcification in the right upper quadrant is felt to be related to chondral cartilage rather than a soft tissue calcification. Right lower lobe airspace disease and cardiomegaly were assessed on chest radiograph earlier today. IMPRESSION: 1. Nonspecific generalized paucity of small bowel gas. 2. Air within nondilated colon. No significant formed stool to suggest  constipation. 3. Feeding tube in place. Electronically Signed   By: Keith Rake M.D.   On: 01/07/2021 18:49   DG Abd Portable 1V  Result Date: 12/27/2020 CLINICAL DATA:  Feeding tube placement. EXAM: PORTABLE ABDOMEN - 1 VIEW COMPARISON:  Radiograph 12/16/2020 FINDINGS: Tip of the weighted enteric tube is just to the left of midline in the region of the distal duodenum. Air throughout mildly prominent small bowel loops in the central abdomen. Previous gaseous gastric distension is no longer seen. IMPRESSION: Tip of the weighted enteric tube in the distal duodenum. Electronically Signed   By: Keith Rake M.D.   On: 12/27/2020 15:40   DG Abd Portable 1 View  Result Date: 12/16/2020 CLINICAL DATA:  OG placement EXAM: PORTABLE ABDOMEN - 1 VIEW COMPARISON:  None. FINDINGS: Enteric tube appears appropriately positioned in the stomach. Lung bases appear clear. No free intraperitoneal air seen in the upper abdomen. IMPRESSION: Enteric tube appears appropriately positioned in the stomach. Electronically Signed   By: Franki Cabot M.D.   On: 12/16/2020 12:16   Overnight EEG with video  Result Date: 12/30/2020 Lora Havens, MD     12/31/2020  9:30 AM Patient Name: Luis Hunter MRN: [id number] Epilepsy Attending: Lora Havens Referring Physician/Provider: Dr Lesleigh Noe Duration: 12/29/2020 1201 to 12/30/2020 1201  Patient history: 45 year old with status epilepticus-has a history of bipolar disorder, depression, schizophrenia polysubstance abuse and seizures with noncompliance to medications. EEG to evaluate for seizure  Level of alertness: lethargic  AEDs during EEG study: LEV, PHT, Vimpat  Technical aspects: This EEG study was done with scalp electrodes positioned according to the 10-20 International system of electrode placement.  Electrical activity was acquired at a sampling rate of '500Hz'$  and reviewed with a high frequency filter of '70Hz'$  and a low frequency filter of '1Hz'$ . EEG data were  recorded continuously and digitally stored.  Description: No clear posterior dominant rhythm was seen.  Sleep was characterized by sleep spindles (12 to 14 Hz), maximum frontocentral region. EEG showed continuous generalized 3-5 Hz theta-delta slowing. Event button was pressed on 12/29/2020 at 2045.  Per RN patient was noted to have diaphoresiswhich was difficult to visualize on camera.  Concomitant EEG before, during and after the event did not show any EEG changes suggest seizure. Event button was pressed on 12/30/2020 at 0219.  Per RN patient was noted to have sudden hypertension and disphoresis which was difficult to visualize on camera.  Concomitant EEG before, during and after the event did not show any EEG changes suggest seizure.  ABNORMALITY -Continuous slow, generalized  IMPRESSION: This study is suggestive of moderate to severe diffuse encephalopathy, nonspecific etiology. No seizures or definite epileptiform discharges were seen during this study. Two events of sudden hypertension and diaphoresis were noted as described above without concomitant eeg change. These events were NOT epileptic.  Priyanka Barbra Sarks   Overnight EEG with video  Result Date: 12/17/2020 Lora Havens, MD     12/18/2020  9:55 AM Patient Name: SENAY CAPOZZA MRN: [id number] Epilepsy Attending: Lora Havens Referring Physician/Provider: Dr Amie [place] Duration: 12/16/2020 1803 to 12/17/2020 1803 Patient history: 45 year old with status epilepticus-has a history of bipolar disorder, depression, schizophrenia polysubstance abuse and seizures with noncompliance to medications. EEG to evaluate for seizure Level of alertness: comatose AEDs during EEG study: LEV, PHT, propofol, Versed Technical aspects: This EEG study was done with scalp electrodes positioned according to the 10-20 International system of electrode placement. Electrical activity was acquired at a sampling rate of '500Hz'$  and reviewed with a high frequency filter of '70Hz'$   and a low frequency filter of '1Hz'$ . EEG data were recorded continuously and digitally stored. Description:  EEG initially showed burst suppression pattern with burst of generalized 2-'3hz'$  delta slowing and overriding 15-'18Hz'$  beta activity lasting 2-3 seconds and eeg suppression lasting 8-12 seconds. Gradually, EEG became near continuous and showed 3 to 5 Hz sharply contoured theta-delta slowing in right hemisphere which at times appeared rhythmic with triphasic morphology, no definite evolution was seen.  There was also near continuous 3 to 5 Hz theta - delta slowing in left hemisphere.  Brief 2 to 3 seconds of generalized EEG attenuation were also noted. IMPRESSION: This study is suggestive of cortical dysfunction in left hemisphere likely secondary to underlying structural abnormality, postictal state.  EEG also showed lateralized rhythmic delta activity which at times appeared sharply contoured without definite evolution and right hemisphere which is suggestive of cortical dysfunction in right hemisphere.  This EEG pattern is on the ictal-interictal continuum with low to intermediate potential for seizure recurrence.  Additionally there is severe diffuse encephalopathy, nonspecific etiology but likely related to sedation, seizure. Lora Havens   ECHOCARDIOGRAM COMPLETE  Result Date: 12/25/2020    ECHOCARDIOGRAM REPORT   Patient Name:   JOSHAUN NANNINI Date of Exam: 12/25/2020 Medical Rec #:  [id number]     Height:       71.0 in Accession #:    [id number]    Weight:       202.2 lb Date of Birth:  Jun 01, 1975     BSA:          2.118 m Patient  Age:    11 years      BP:           151/93 mmHg Patient Gender: M             HR:           113 bpm. Exam Location:  Inpatient Procedure: 2D Echo, Cardiac Doppler and Color Doppler Indications:    R94.31 Abnormal EKG  History:        Patient has prior history of Echocardiogram examinations, most                 recent 11/19/2019. Abnormal ECG. Seizures. Drug abuse.                  Schizophrenia. Bipolar. Respiratory Failure.  Sonographer:    Merrie Roof RDCS Referring Phys: [place]  1. Technically difficult study. Left ventricular ejection fraction, by estimation, is 50 to 55%. The left ventricle has low normal function. The left ventricle has no regional wall motion abnormalities. There is severe left ventricular hypertrophy. Left  ventricular diastolic parameters are indeterminate.  2. Right ventricular systolic function is normal. The right ventricular size is mildly enlarged. Tricuspid regurgitation signal is inadequate for assessing PA pressure.  3. A small pericardial effusion is present.  4. The mitral valve is normal in structure. No evidence of mitral valve regurgitation.  5. The aortic valve was not well visualized. Aortic valve regurgitation is not visualized. No aortic stenosis is present. FINDINGS  Left Ventricle: Left ventricular ejection fraction, by estimation, is 50 to 55%. The left ventricle has low normal function. The left ventricle has no regional wall motion abnormalities. The left ventricular internal cavity size was normal in size. There is severe left ventricular hypertrophy. Left ventricular diastolic parameters are indeterminate. Right Ventricle: The right ventricular size is mildly enlarged. Right vetricular wall thickness was not well visualized. Right ventricular systolic function is normal. Tricuspid regurgitation signal is inadequate for assessing PA pressure. Left Atrium: Left atrial size was normal in size. Right Atrium: Right atrial size was normal in size. Pericardium: A small pericardial effusion is present. Mitral Valve: The mitral valve is normal in structure. No evidence of mitral valve regurgitation. Tricuspid Valve: The tricuspid valve is normal in structure. Tricuspid valve regurgitation is trivial. Aortic Valve: The aortic valve was not well visualized. Aortic valve regurgitation is not visualized. No aortic stenosis is  present. Aortic valve mean gradient measures 4.0 mmHg. Aortic valve peak gradient measures 8.5 mmHg. Aortic valve area, by VTI measures 4.04 cm. Pulmonic Valve: The pulmonic valve was not well visualized. Pulmonic valve regurgitation is not visualized. Aorta: The aortic root is normal in size and structure. IAS/Shunts: The interatrial septum was not well visualized.  LEFT VENTRICLE PLAX 2D LVIDd:         6.80 cm LVIDs:         4.90 cm LV PW:         1.00 cm LV IVS:        1.00 cm LVOT diam:     2.30 cm LV SV:         70 LV SV Index:   33 LVOT Area:     4.15 cm  LV Volumes (MOD) LV vol d, MOD A2C: 86.3 ml LV vol d, MOD A4C: 102.0 ml LV vol s, MOD A2C: 31.3 ml LV vol s, MOD A4C: 33.4 ml LV SV MOD A2C:     55.0 ml LV SV MOD A4C:  102.0 ml LV SV MOD BP:      62.7 ml RIGHT VENTRICLE RV Basal diam:  3.50 cm RV S prime:     13.90 cm/s TAPSE (M-mode): 1.6 cm LEFT ATRIUM           Index       RIGHT ATRIUM           Index LA diam:      2.90 cm 1.37 cm/m  RA Area:     16.40 cm LA Vol (A4C): 40.4 ml 19.07 ml/m RA Volume:   47.50 ml  22.42 ml/m  AORTIC VALVE AV Area (Vmax):    3.41 cm AV Area (Vmean):   3.53 cm AV Area (VTI):     4.04 cm AV Vmax:           146.00 cm/s AV Vmean:          95.700 cm/s AV VTI:            0.174 m AV Peak Grad:      8.5 mmHg AV Mean Grad:      4.0 mmHg LVOT Vmax:         120.00 cm/s LVOT Vmean:        81.400 cm/s LVOT VTI:          0.169 m LVOT/AV VTI ratio: 0.97  AORTA Ao Root diam: 3.20 cm  SHUNTS Systemic VTI:  0.17 m Systemic Diam: 2.30 cm Oswaldo Milian MD Electronically signed by Oswaldo Milian MD Signature Date/Time: 12/25/2020/4:04:03 PM    Final    US Abdomen Limited RUQ (LIVER/GB)  Result Date: 01/01/2021 CLINICAL DATA:  Abnormal liver function tests. EXAM: ULTRASOUND ABDOMEN LIMITED RIGHT UPPER QUADRANT COMPARISON:  None. FINDINGS: Gallbladder: Moderately thickened gallbladder wall is noted at 4 mm. No cholelithiasis or pericholecystic fluid is noted. No  sonographic Murphy's sign is noted. Common bile duct: Diameter: 1 mm which is within normal limits. Liver: 2.4 x 2.2 x 0.9 cm hyperechoic focus is noted peripherally in the posterior segment of right hepatic lobe. Within normal limits in parenchymal echogenicity. Portal vein is patent on color Doppler imaging with normal direction of blood flow towards the liver. Other: None. IMPRESSION: Moderately thickened gallbladder wall thickening is noted without cholelithiasis or pericholecystic fluid. There is clinical concern for cholecystitis, HIDA scan may be performed for further evaluation. 2.4 x 2.2 x 0.9 cm hyperechoic focus is noted peripherally in posterior segment of right hepatic lobe which most likely represents benign hemangioma in the absence of any history of primary malignancy. Follow-up ultrasound in 6 months is recommended to ensure stability. If the patient does have a history of malignancy, then MRI is recommended to evaluate for possible metastatic disease. Electronically Signed   By: Marijo Conception M.D.   On: 01/01/2021 14:21    Labs:  CBC: Recent Labs    01/06/21 0030 01/07/21 0158 01/09/21 0315 01/09/21 1112  WBC 17.4* 18.5* 23.5* 24.0*  HGB 8.8* 9.0* 10.6* 9.2*  HCT 26.4* 27.3* 31.8* 28.1*  PLT 319 308 358 321    COAGS: No results for input(s): INR, APTT in the last 8760 hours.  BMP: Recent Labs    01/05/21 0154 01/06/21 0030 01/07/21 0158 01/09/21 0315  NA 137 135 133* 130*  K 3.7 4.2 5.0 4.6  CL 99 100 98 95*  CO2 '28 28 27 26  '$ GLUCOSE 130* 116* 125* 128*  BUN '15 16 20 '$ 26*  CALCIUM 8.3* 8.3* 8.4* 8.8*  CREATININE 0.78 0.65 0.66  0.56*  GFRNONAA >60 >60 >60 >60    LIVER FUNCTION TESTS: Recent Labs    01/03/21 0043 01/04/21 0217 01/06/21 0030 01/09/21 0315  BILITOT 0.7 1.1 1.0 0.8  AST 315* 256* 179* 140*  ALT 485* 477* 379* 427*  ALKPHOS 144* 148* 150* 197*  PROT 5.9* 6.4* 6.5 8.0  ALBUMIN 1.5* 1.6* 1.6* 2.2*     Assessment and Plan:  Non  responsive patient in need of nutritional support in transition to [facility]  Due to upward trending WBC, will hold off on scheduling procedure, until downtrend.  He is currently afebrile.    Procedure request reviewed and approved by Dr. Pascal Lux.  Risks and benefits image guided gastrostomy tube placement was discussed with the patient's father via phone including, but not limited to the need for a barium enema during the procedure, bleeding, infection, peritonitis and/or damage to adjacent structures.  All of the father's questions were answered, and  is agreeable to proceed.  Consent signed and in chart.   Thank you for this interesting consult.  I greatly enjoyed meeting LEXON BEHLEN and look forward to participating in their care.  A copy of this report was sent to the requesting provider on this date.  Electronically Signed: Trenton Founds Boisseau 01/09/2021, 1:54 PM   I spent a total of 40 Minutes in face to face in clinical consultation, greater than 50% of which was counseling/coordinating care for Percutaneous gastrostomy tube placement.

## 2021-01-09 NOTE — Progress Notes (Signed)
NAME:  Luis Hunter, MRN:  [id number], DOB:  Aug 11, 1975, LOS: 24 ADMISSION DATE:  12/16/2020, CONSULTATION DATE:  8/13 REFERRING MD:  Starleen Blue EDP [facility], CHIEF COMPLAINT:  Seizure   History of Present Illness:  45 y/o male found unresponsive after having multiple seizures.  Intubated for airway protection and loadded with Keppra.  UDS positive for cocaine and THC.  Transferred from [facility] to [facility] for continuous EEG.  Since admission he has had a LHC for an abnormal EKG, but this was felt to be due to severe LVH.  He has been treated with antibiotics for pneumonia.  He has had recurrent fevers for days.   Pertinent  Medical History  Schizophrenia, Cocaine abuse, Bipolar disorder, Depression  Significant Hospital Events: Including procedures, antibiotic start and stop dates in addition to other pertinent events   8/13 intubated, transferred to [facility] 8/20 vent dyssynchrony >> add precedex; EEG with periodic triphasic morphology and generalized slowing 8/23 remains encephalopathic, required escalation of sedatives for vent dyssynchrony  8/28 goals of care conversation: plan tracheostomy  8/29 dilantin stopped by neurology, tracheostomy 8/31 followed commands, stopped precedex but restarted overnight 9/03 changed to pressure control, start clonidine taper  Tubes/lines: 8/13 ETT > 8/29 8/29 tracheostomy >   Antibiotics 8/16 started on unasyn 8/17 unasyn discontinued 8/18 started on zosyn 8/25 started augmentin for sinusitis 8/28 Meropenem  Cultures 8/16 respiratory aspirate with gram positive cocci, few gram negative rods 8/26 resp culture > serratia   Procedures/imaging: 8/22 cardiac catheterization for STEMI-no significant coronary artery disease discovered 8/24 MRI-mastoid fullness, no intracerebral abnormality 8/27 on cEEG 8/29 RUQ ultrasound > hepatic hemangioma 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 No acute issues overnight.  Objective   Blood pressure (!) 150/109, pulse (!) 106, temperature 98.7 F (37.1 C), temperature source Axillary, resp. rate (!) 27, height '5\' 11"'$  (1.803 m), weight 74.8 kg, SpO2 94 %.    Vent Mode: PCV FiO2 (%):  [30 %-100 %] 40 % Set Rate:  [16 bmp] 16 bmp PEEP:  [5 cmH20] 5 cmH20 Pressure Support:  [5 cmH20-10 cmH20] 5 cmH20 Plateau Pressure:  [12 cmH20-17 cmH20] 12 cmH20   Intake/Output Summary (Last 24 hours) at 01/09/2021 1021 Last data filed at 01/09/2021 0900 Gross per 24 hour  Intake 1685 ml  Output 1820 ml  Net -135 ml   Filed Weights   01/07/21 0500 01/08/21 0600 01/09/21 0236  Weight: 75 kg 73 kg 74.8 kg    Examination:  General - no acute distress, chronically ill appearing Eyes - pupils reactive, sclera anicteric ENT - trach site clean Cardiac - regular rate/rhythm, no murmur Chest - course breath sounds bilaterally Abdomen - soft, non tender, + bowel sounds Extremities - no cyanosis, clubbing, or edema Skin - no rashes Neuro - intermittent head and Rt arm tremor  Resolved Hospital Problem list   Hypernatremia  Assessment & Plan:   Acute metabolic encephalopathy. Polysubstance abuse with hx of depression, bipolar, and schizophrenia. Epilepsy. - initially felt to be post ictal.  now concern is for sepsis, hypoxia, and underlying mental health disorder - started clonidine taper on 9/03 to wean off precedex - reduce klonopin to 0.'5mg'$  from 1 mg q8h on 9/6 - continue remeron, abilify - continue vimpat, keppra  Acute hypoxemic respiratory failure from HCAP. Presumptive diagnosis of asthma. 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 - changed to pressure control on 9/03 - pressure support  to TC as able - f/u CXR intermittently - bronchial hygiene - goal SpO2 > 92% - continue pulmicort, brovana, yupelri with prn albuterol  HCAP with Serratia and Enterobacter. - recurrent fever with increased WBC and respiratory secretions on  8/28 - completed ABx on 9/03  Leukocytosis/?Allergic Reaction He has had elevated eosinophil levels on previous differentials. Will repeat today and consider possible allergic reaction driving his WBC count and fevers  Hypertension. - goal SBP < 140, DBP < 95 - decreased dose of norvasc while on clonidine - continue HCTZ, lopressor  Elevated LFTs from shock and hypoxia >> improving. Liver hemangioma. - Will need outpatient repeat imaging of hepatic hemangioma - f/u LFT intermittently  Dysphagia. - IR evaluating for PEG placement  Disposition. - will likely need LTACH  Best Practice (right click and "Reselect all SmartList Selections" daily)   Diet/type: tubefeeds DVT prophylaxis: LMWH GI prophylaxis: PPI Lines: N/A Foley:  N/A Code Status:  full code Last date of multidisciplinary goals of care discussion [8/28]  Labs    CMP Latest Ref Rng & Units 01/09/2021 01/07/2021 01/06/2021  Glucose 70 - 99 mg/dL 128(H) 125(H) 116(H)  BUN 6 - 20 mg/dL 26(H) 20 16  Creatinine 0.61 - 1.24 mg/dL 0.56(L) 0.66 0.65  Sodium 135 - 145 mmol/L 130(L) 133(L) 135  Potassium 3.5 - 5.1 mmol/L 4.6 5.0 4.2  Chloride 98 - 111 mmol/L 95(L) 98 100  CO2 22 - 32 mmol/L '26 27 28  '$ Calcium 8.9 - 10.3 mg/dL 8.8(L) 8.4(L) 8.3(L)  Total Protein 6.5 - 8.1 g/dL 8.0 - 6.5  Total Bilirubin 0.3 - 1.2 mg/dL 0.8 - 1.0  Alkaline Phos 38 - 126 U/L 197(H) - 150(H)  AST 15 - 41 U/L 140(H) - 179(H)  ALT 0 - 44 U/L 427(H) - 379(H)    CBC Latest Ref Rng & Units 01/09/2021 01/07/2021 01/06/2021  WBC 4.0 - 10.5 K/uL 23.5(H) 18.5(H) 17.4(H)  Hemoglobin 13.0 - 17.0 g/dL 10.6(L) 9.0(L) 8.8(L)  Hematocrit 39.0 - 52.0 % 31.8(L) 27.3(L) 26.4(L)  Platelets 150 - 400 K/uL 358 308 319    CBG (last 3)  Recent Labs    01/08/21 2340 01/09/21 0332 01/09/21 0738  GLUCAP 121* 108* 120*    ABG    Component Value Date/Time   PHART 7.366 12/31/2020 2242   PCO2ART 59.2 (H) 12/31/2020 2242   PO2ART 100 12/31/2020 2242   HCO3  33.8 (H) 12/31/2020 2242   TCO2 36 (H) 12/31/2020 2242   ACIDBASEDEF 1.0 12/21/2020 1101   O2SAT 97.0 12/31/2020 2242    Critical Care time: 45 minutes   Freda Jackson, MD [facility] Office: [phone]   See Amion for personal pager PCCM on call pager [phone] until 7pm. Please call Elink 7p-7a. [phone]

## 2021-01-09 NOTE — Progress Notes (Signed)
Assisted tele visit to patient with family member.  Mumtaz Lovins M, RN   

## 2021-01-10 DIAGNOSIS — J9602 Acute respiratory failure with hypercapnia: Secondary | ICD-10-CM | POA: Diagnosis not present

## 2021-01-10 LAB — BASIC METABOLIC PANEL
Anion gap: 10 (ref 5–15)
BUN: 27 mg/dL — ABNORMAL HIGH (ref 6–20)
CO2: 24 mmol/L (ref 22–32)
Calcium: 9.2 mg/dL (ref 8.9–10.3)
Chloride: 97 mmol/L — ABNORMAL LOW (ref 98–111)
Creatinine, Ser: 0.55 mg/dL — ABNORMAL LOW (ref 0.61–1.24)
GFR, Estimated: >60 mL/min (ref >60)
Glucose, Bld: 131 mg/dL — ABNORMAL HIGH (ref 70–99)
Potassium: 4.5 mmol/L (ref 3.5–5.1)
Sodium: 131 mmol/L — ABNORMAL LOW (ref 135–145)

## 2021-01-10 LAB — GLUCOSE, CAPILLARY
Glucose-Capillary: 125 mg/dL — ABNORMAL HIGH (ref 70–99)
Glucose-Capillary: 104 mg/dL — ABNORMAL HIGH (ref 70–99)
Glucose-Capillary: 106 mg/dL — ABNORMAL HIGH (ref 70–99)
Glucose-Capillary: 119 mg/dL — ABNORMAL HIGH (ref 70–99)
Glucose-Capillary: 120 mg/dL — ABNORMAL HIGH (ref 70–99)

## 2021-01-10 MED ORDER — LACOSAMIDE 50 MG PO TABS: 50.0000 mg | ORAL_TABLET | Freq: Every day | ORAL | Status: DC

## 2021-01-10 MED ORDER — CLONAZEPAM 0.5 MG PO TBDP
1.0000 mg | ORAL_TABLET | Freq: Three times a day (TID) | ORAL | Status: DC
  Administered 2021-01-10 – 2021-01-16 (×17): 1 mg
  Filled 2021-01-10 (×17): qty 2

## 2021-01-10 MED ORDER — LORAZEPAM 1 MG PO TABS
1.0000 mg | ORAL_TABLET | Freq: Once | ORAL | Status: AC
  Administered 2021-01-10: 1 mg via ORAL
  Filled 2021-01-10 (×2): qty 1

## 2021-01-10 MED ORDER — PROSOURCE TF PO LIQD
45.0000 mL | Freq: Three times a day (TID) | ORAL | Status: DC
  Administered 2021-01-10 – 2021-01-16 (×19): 45 mL
  Filled 2021-01-10 (×19): qty 45

## 2021-01-10 MED ORDER — LORAZEPAM 2 MG/ML IJ SOLN
1.0000 mg | Freq: Four times a day (QID) | INTRAMUSCULAR | Status: DC | PRN
  Administered 2021-01-12 – 2021-01-15 (×10): 1 mg via INTRAVENOUS
  Filled 2021-01-10 (×10): qty 1

## 2021-01-10 MED ORDER — LACOSAMIDE 50 MG PO TABS: 75.0000 mg | ORAL_TABLET | Freq: Two times a day (BID) | ORAL | Status: DC

## 2021-01-10 MED ORDER — POLYETHYLENE GLYCOL 3350 17 G PO PACK
17.0000 g | PACK | Freq: Two times a day (BID) | ORAL | Status: DC
  Administered 2021-01-10 – 2021-01-16 (×7): 17 g
  Filled 2021-01-10 (×8): qty 1

## 2021-01-10 MED ORDER — LACOSAMIDE 50 MG PO TABS: 50.0000 mg | ORAL_TABLET | Freq: Two times a day (BID) | ORAL | Status: DC

## 2021-01-10 MED ORDER — BISACODYL 10 MG RE SUPP: 10.0000 mg | Freq: Once | RECTAL | Status: DC

## 2021-01-10 NOTE — Progress Notes (Signed)
Patient's "mental health team" the [place] team called asking for an update and provided the patient's password. Thayer Headings with the [employer] states she has been getting updates from family and they have provided her with the password to get information.    [place]

## 2021-01-10 NOTE — Progress Notes (Signed)
Subjective: No definite seizures per ICU team  ROS: Unable to obtain due to poor mental status  Examination  Vital signs in last 24 hours: Temp:  [98.7 F (37.1 C)-99.8 F (37.7 C)] 99.1 F (37.3 C) (09/07 1215) Pulse Rate:  [87-111] 93 (09/07 1300) Resp:  [16-35] 25 (09/07 1300) BP: (98-147)/(68-104) 104/71 (09/07 1300) SpO2:  [86 %-100 %] 96 % (09/07 1300) FiO2 (%):  [40 %] 40 % (09/07 1300) Weight:  [71.8 kg] 71.8 kg (09/07 0500)  General: lying in bed, NAD CVS: pulse-normal rate and rhythm RS: trach, coarse breath sounds bilaterally Extremities: warm , mild edema Neuro: Awake, alert, attempts to follow commands like sticking out his tongue and wiggling toes but not consistently, PERRLA, tracks examiner in room, no apparent facial asymmetry, spontaneously moving all 4 extremities  Basic Metabolic Panel: Recent Labs  Lab 01/05/21 0154 01/06/21 0030 01/07/21 0158 01/09/21 0315 01/10/21 1047  NA 137 135 133* 130* 131*  K 3.7 4.2 5.0 4.6 4.5  CL 99 100 98 95* 97*  CO2 '28 28 27 26 24  '$ GLUCOSE 130* 116* 125* 128* 131*  BUN '15 16 20 '$ 26* 27*  CREATININE 0.78 0.65 0.66 0.56* 0.55*  CALCIUM 8.3* 8.3* 8.4* 8.8* 9.2    CBC: Recent Labs  Lab 01/04/21 0217 01/05/21 0154 01/06/21 0030 01/07/21 0158 01/09/21 0315 01/09/21 1112  WBC 19.0* 17.9* 17.4* 18.5* 23.5* 24.0*  NEUTROABS 15.6*  --   --   --   --  20.4*  HGB 9.0* 9.1* 8.8* 9.0* 10.6* 9.2*  HCT 26.6* 27.6* 26.4* 27.3* 31.8* 28.1*  MCV 86.6 86.8 86.3 87.5 86.4 88.4  PLT 430* 401* 319 308 358 321     Coagulation Studies: No results for input(s): LABPROT, INR in the last 72 hours.  Imaging  No new brain imaging overnight  ASSESSMENT AND PLAN: 45 year old male with history of epilepsy who presented with status epilepticus in the setting of medication noncompliance.   Status epilepticus (resolved) Epilepsy with breakthrough seizure Acute encephalopathy, postictal and medication induced ( improving) -No  further seizures.  Patient mental status appears to be slowly improving.  Recommendations -I was initially planning on tapering off Vimpat.  However, ICU team is concerned about eosinophilia.  To minimize confounding factors, I will discontinue Vimpat for now and keep a close eye for seizure recurrence versus worsening mental status. -Continue Keppra to 1500 mg twice daily and Klonopin 0.5 mg every 8 hours -If patient has worsening mental status or seizure recurrence, please notify neurology.  Will likely consider repeat EEG and adjust antiseizure medications -Continue seizure precautions -As needed IV versed for clinical seizure-like activity -Management of rest of comorbidities per primary team  Thank you for allowing [place] to participate in the care of this patient.  Neurology will follow peripherally.  Please call us for any further questions.  Zeb Comfort Epilepsy [facility] For questions after 5pm please refer to AMION to reach the Neurologist on call

## 2021-01-10 NOTE — Progress Notes (Signed)
Nutrition Follow-up  DOCUMENTATION CODES:   Not applicable  INTERVENTION:   Continue TF via post pyloric Cortrak: Vital 1.5 at 65 ml/h (1560 ml per day) Prosource TF 45 ml increase to TID  Provides 2460 kcal, 138 gm protein, 1192 ml free water daily.  Free water flushes 100 ml q 8 h for a total of 1492 ml per day.  NUTRITION DIAGNOSIS:   Inadequate oral intake related to inability to eat as evidenced by NPO status.  Ongoing   GOAL:   Patient will meet greater than or equal to 90% of their needs  Met with TF  MONITOR:   Vent status, Labs, Weight trends, TF tolerance, Skin, I & O's  REASON FOR ASSESSMENT:   Consult, Ventilator Enteral/tube feeding initiation and management  ASSESSMENT:   45 year old man with prior history of seizures and substance abuse found on the floor unresponsive by his wife.  Multiple witnessed seizures by wife and EMS, received 8 mg of IV Versed in the field and continued to seize on arrival to the ED, received 4 mg of IV Ativan, intubated for airway protection and started on propofol drip.  Loaded with Keppra.  Versed drip was added since he continued to have twitching on maximum dose of propofol.  Head CT was negative.  UDS positive for cocaine and THC  Discussed patient in ICU rounds and with RN today. Plans for PEG placement soon.   S/P tracheostomy 8/29. Cortrak in place, tip is post-pyloric in the distal duodenum. Tolerating TF at goal: Vital 1.5 at 65 ml/h with Prosource TF 45 ml via tube once daily.  Patient remains on ventilator support via trach. MV: 18.2 L/min Temp (24hrs), Avg:99.2 F (37.3 C), Min:98.7 F (37.1 C), Max:99.8 F (37.7 C)   Labs reviewed. Na 131  CBG: 125-104-106  Medications reviewed and include HCTZ, keppra, remeron, protonix, miralax.  Admission weight 81.3 kg Current weight 71.8 kg I/O +18 L since admission UOP 2075 ml (+1 unmeasured occurrence) x 24 hours  No BM since 9/2. Miralax increased to BID  today.  New stage 2 pressure injury noted. Will increase protein supplementation today.   Diet Order:   Diet Order     None       EDUCATION NEEDS:   Not appropriate for education at this time  Skin:  Skin Assessment: Skin Integrity Issues: Skin Integrity Issues:: Stage II Stage II: sacrum Other: skin tear L buttocks, MASD to coccyx  Last BM:  9/2  Height:   Ht Readings from Last 1 Encounters:  01/01/21 5' 11"  (1.803 m)    Weight:   Wt Readings from Last 1 Encounters:  01/10/21 71.8 kg    Ideal Body Weight:  78.2 kg  BMI:  Body mass index is 22.08 kg/m.  Estimated Nutritional Needs:   Kcal:  2450  Protein:  120-140 gm  Fluid:  >/= 2.4 L    Lucas Mallow, RD, LDN, CNSC Please refer to Amion for contact information.

## 2021-01-10 NOTE — Progress Notes (Addendum)
NAME:  Luis Hunter, MRN:  [id number], DOB:  April 18, 1976, LOS: 28 ADMISSION DATE:  12/16/2020, CONSULTATION DATE:  8/13 REFERRING MD:  Starleen Blue EDP [facility], CHIEF COMPLAINT:  Seizure   History of Present Illness:  45 y/o male found unresponsive after having multiple seizures.  Intubated for airway protection and loadded with Keppra.  UDS positive for cocaine and THC.  Transferred from [facility] to [facility] for continuous EEG.  Since admission he has had a LHC for an abnormal EKG, but this was felt to be due to severe LVH.  He has been treated with antibiotics for pneumonia.  He has had recurrent fevers for days.   Pertinent  Medical History  Schizophrenia, Cocaine abuse, Bipolar disorder, Depression  Significant Hospital Events: Including procedures, antibiotic start and stop dates in addition to other pertinent events   8/13 intubated, transferred to [facility] 8/20 vent dyssynchrony >> add precedex; EEG with periodic triphasic morphology and generalized slowing 8/23 remains encephalopathic, required escalation of sedatives for vent dyssynchrony  8/28 goals of care conversation: plan tracheostomy  8/29 dilantin stopped by neurology, tracheostomy 8/31 followed commands, stopped precedex but restarted overnight 9/03 changed to pressure control, start clonidine taper  Tubes/lines: 8/13 ETT > 8/29 8/29 tracheostomy >   Antibiotics 8/16 started on unasyn 8/17 unasyn discontinued 8/18 started on zosyn 8/25 started augmentin for sinusitis 8/28 Meropenem  Cultures 8/16 respiratory aspirate with gram positive cocci, few gram negative rods 8/26 resp culture > serratia   Procedures/imaging: 8/22 cardiac catheterization for STEMI-no significant coronary artery disease discovered 8/24 MRI-mastoid fullness, no intracerebral abnormality 8/27 on cEEG 8/29 RUQ ultrasound > hepatic hemangioma 8/31 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 Contacted neurology about coming off vimpat due to concern for allergic reaction  Objective   Blood pressure 112/84, pulse 100, temperature 99.3 F (37.4 C), temperature source Oral, resp. rate (!) 28, height '5\' 11"'$  (1.803 m), weight 71.8 kg, SpO2 96 %.    Vent Mode: CPAP;PSV FiO2 (%):  [40 %] 40 % Set Rate:  [16 bmp] 16 bmp PEEP:  [5 cmH20] 5 cmH20 Pressure Support:  [8 cmH20-10 cmH20] 8 cmH20 Plateau Pressure:  [15 cmH20-17 cmH20] 16 cmH20   Intake/Output Summary (Last 24 hours) at 01/10/2021 1008 Last data filed at 01/10/2021 0800 Gross per 24 hour  Intake 1010 ml  Output 2150 ml  Net -1140 ml   Filed Weights   01/08/21 0600 01/09/21 0236 01/10/21 0500  Weight: 73 kg 74.8 kg 71.8 kg    Examination:  General - no acute distress, chronically ill appearing, sweaty Eyes - pupils reactive, sclera anicteric ENT - trach site clean Cardiac - regular rate/rhythm, no murmur Chest - course breath sounds bilaterally Abdomen - soft, non tender, + bowel sounds Extremities - no cyanosis, clubbing, or edema Skin - no rashes Neuro - intermittent head and Rt arm tremor  Resolved Hospital Problem list   Hypernatremia  Assessment & Plan:   Acute metabolic encephalopathy. Polysubstance abuse with hx of depression, bipolar, and schizophrenia. Epilepsy. - initially felt to be post ictal.  now concern is for sepsis, hypoxia, and underlying mental health disorder - started clonidine taper on 9/03 to wean off precedex - reduce klonopin to 0.'5mg'$  from 1 mg q8h on 9/6 - continue remeron, abilify - continue keppra, stopping vimpat due to concern for allergic reaction with peripheral eosinophilia and persistent  leukocytosis  Acute hypoxemic respiratory failure from HCAP. Presumptive diagnosis of asthma. 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 - changed to pressure control on 9/03 - pressure support to TC as able - f/u CXR intermittently - bronchial  hygiene - goal SpO2 > 92% - continue pulmicort, brovana, yupelri with prn albuterol - Dense lower lobe infiltrates concerning for aspiration vs organizing pneumonia. Will continue to monitor and consider starting steroid taper if more concerned for organizing pneumonia  HCAP with Serratia and Enterobacter. - recurrent fever with increased WBC and respiratory secretions on 8/28 - completed ABx on 9/03  Leukocytosis/?Allergic Reaction Peripheral Eosinophilia - peripheral eosinophil count has trended up since admission - contacted neurology to transition off vimpat to other antiepileptic  Hypertension. - goal SBP < 140, DBP < 95 - decreased dose of norvasc while on clonidine - continue HCTZ, lopressor  Hyponatremia - free water was stopped yesterday morning - HCTZ started 8/31 - check BMP today, will consider stopping HCTZ  Elevated LFTs from shock and hypoxia >> improving. Liver hemangioma. - Will need outpatient repeat imaging of hepatic hemangioma - f/u LFT intermittently  Dysphagia. - IR evaluating for PEG placement  Disposition. - will likely need LTACH  Constipation - last bowel movement 9/2 - miralax BID - give suppository today  Best Practice (right click and "Reselect all SmartList Selections" daily)   Diet/type: tubefeeds DVT prophylaxis: LMWH GI prophylaxis: PPI Lines: N/A Foley:  N/A Code Status:  full code Last date of multidisciplinary goals of care discussion [8/28]  Labs    CMP Latest Ref Rng & Units 01/09/2021 01/07/2021 01/06/2021  Glucose 70 - 99 mg/dL 128(H) 125(H) 116(H)  BUN 6 - 20 mg/dL 26(H) 20 16  Creatinine 0.61 - 1.24 mg/dL 0.56(L) 0.66 0.65  Sodium 135 - 145 mmol/L 130(L) 133(L) 135  Potassium 3.5 - 5.1 mmol/L 4.6 5.0 4.2  Chloride 98 - 111 mmol/L 95(L) 98 100  CO2 22 - 32 mmol/L '26 27 28  '$ Calcium 8.9 - 10.3 mg/dL 8.8(L) 8.4(L) 8.3(L)  Total Protein 6.5 - 8.1 g/dL 8.0 - 6.5  Total Bilirubin 0.3 - 1.2 mg/dL 0.8 - 1.0  Alkaline Phos 38 -  126 U/L 197(H) - 150(H)  AST 15 - 41 U/L 140(H) - 179(H)  ALT 0 - 44 U/L 427(H) - 379(H)    CBC Latest Ref Rng & Units 01/09/2021 01/09/2021 01/07/2021  WBC 4.0 - 10.5 K/uL 24.0(H) 23.5(H) 18.5(H)  Hemoglobin 13.0 - 17.0 g/dL 9.2(L) 10.6(L) 9.0(L)  Hematocrit 39.0 - 52.0 % 28.1(L) 31.8(L) 27.3(L)  Platelets 150 - 400 K/uL 321 358 308    CBG (last 3)  Recent Labs    01/09/21 2329 01/10/21 0341 01/10/21 0722  GLUCAP 110* 125* 104*    ABG    Component Value Date/Time   PHART 7.366 12/31/2020 2242   PCO2ART 59.2 (H) 12/31/2020 2242   PO2ART 100 12/31/2020 2242   HCO3 33.8 (H) 12/31/2020 2242   TCO2 36 (H) 12/31/2020 2242   ACIDBASEDEF 1.0 12/21/2020 1101   O2SAT 97.0 12/31/2020 2242    Critical Care time: 35 minutes   Freda Jackson, MD [facility] Office: [phone]   See Amion for personal pager PCCM on call pager [phone] until 7pm. Please call Elink 7p-7a. [phone]

## 2021-01-11 ENCOUNTER — Encounter (HOSPITAL_COMMUNITY): Payer: Self-pay

## 2021-01-11 ENCOUNTER — Inpatient Hospital Stay (HOSPITAL_COMMUNITY): Payer: Medicare HMO

## 2021-01-11 DIAGNOSIS — J9602 Acute respiratory failure with hypercapnia: Secondary | ICD-10-CM | POA: Diagnosis not present

## 2021-01-11 LAB — CBC WITH DIFFERENTIAL/PLATELET
Abs Immature Granulocytes: 1.34 10*3/uL — ABNORMAL HIGH (ref 0.00–0.07)
Basophils Absolute: 0.2 10*3/uL — ABNORMAL HIGH (ref 0.0–0.1)
Basophils Relative: 1 %
Eosinophils Absolute: 1 10*3/uL — ABNORMAL HIGH (ref 0.0–0.5)
Eosinophils Relative: 4 %
HCT: 32.9 % — ABNORMAL LOW (ref 39.0–52.0)
Hemoglobin: 10.9 g/dL — ABNORMAL LOW (ref 13.0–17.0)
Immature Granulocytes: 5 %
Lymphocytes Relative: 5 %
Lymphs Abs: 1.5 10*3/uL (ref 0.7–4.0)
MCH: 28.8 pg (ref 26.0–34.0)
MCHC: 33.1 g/dL (ref 30.0–36.0)
MCV: 86.8 fL (ref 80.0–100.0)
Monocytes Absolute: 1.5 10*3/uL — ABNORMAL HIGH (ref 0.1–1.0)
Monocytes Relative: 5 %
Neutro Abs: 22.8 10*3/uL — ABNORMAL HIGH (ref 1.7–7.7)
Neutrophils Relative %: 80 %
Platelets: 390 10*3/uL (ref 150–400)
RBC: 3.79 MIL/uL — ABNORMAL LOW (ref 4.22–5.81)
RDW: 15.1 % (ref 11.5–15.5)
WBC: 28.4 10*3/uL — ABNORMAL HIGH (ref 4.0–10.5)
nRBC: 0 % (ref 0.0–0.2)

## 2021-01-11 LAB — BASIC METABOLIC PANEL
Anion gap: 9 (ref 5–15)
BUN: 31 mg/dL — ABNORMAL HIGH (ref 6–20)
CO2: 26 mmol/L (ref 22–32)
Calcium: 9 mg/dL (ref 8.9–10.3)
Chloride: 97 mmol/L — ABNORMAL LOW (ref 98–111)
Creatinine, Ser: 0.52 mg/dL — ABNORMAL LOW (ref 0.61–1.24)
GFR, Estimated: >60 mL/min (ref >60)
Glucose, Bld: 144 mg/dL — ABNORMAL HIGH (ref 70–99)
Potassium: 3.8 mmol/L (ref 3.5–5.1)
Sodium: 132 mmol/L — ABNORMAL LOW (ref 135–145)

## 2021-01-11 LAB — GLUCOSE, CAPILLARY
Glucose-Capillary: 101 mg/dL — ABNORMAL HIGH (ref 70–99)
Glucose-Capillary: 113 mg/dL — ABNORMAL HIGH (ref 70–99)
Glucose-Capillary: 113 mg/dL — ABNORMAL HIGH (ref 70–99)
Glucose-Capillary: 116 mg/dL — ABNORMAL HIGH (ref 70–99)
Glucose-Capillary: 118 mg/dL — ABNORMAL HIGH (ref 70–99)
Glucose-Capillary: 125 mg/dL — ABNORMAL HIGH (ref 70–99)

## 2021-01-11 IMAGING — DX DG CHEST 1V PORT
1 series · 1 of 1 positions shown · non-contrast
Comparison: Chest x-ray dated [DATE].

CLINICAL DATA: Respiratory failure.

EXAM:
PORTABLE CHEST 1 VIEW

[chest ap]
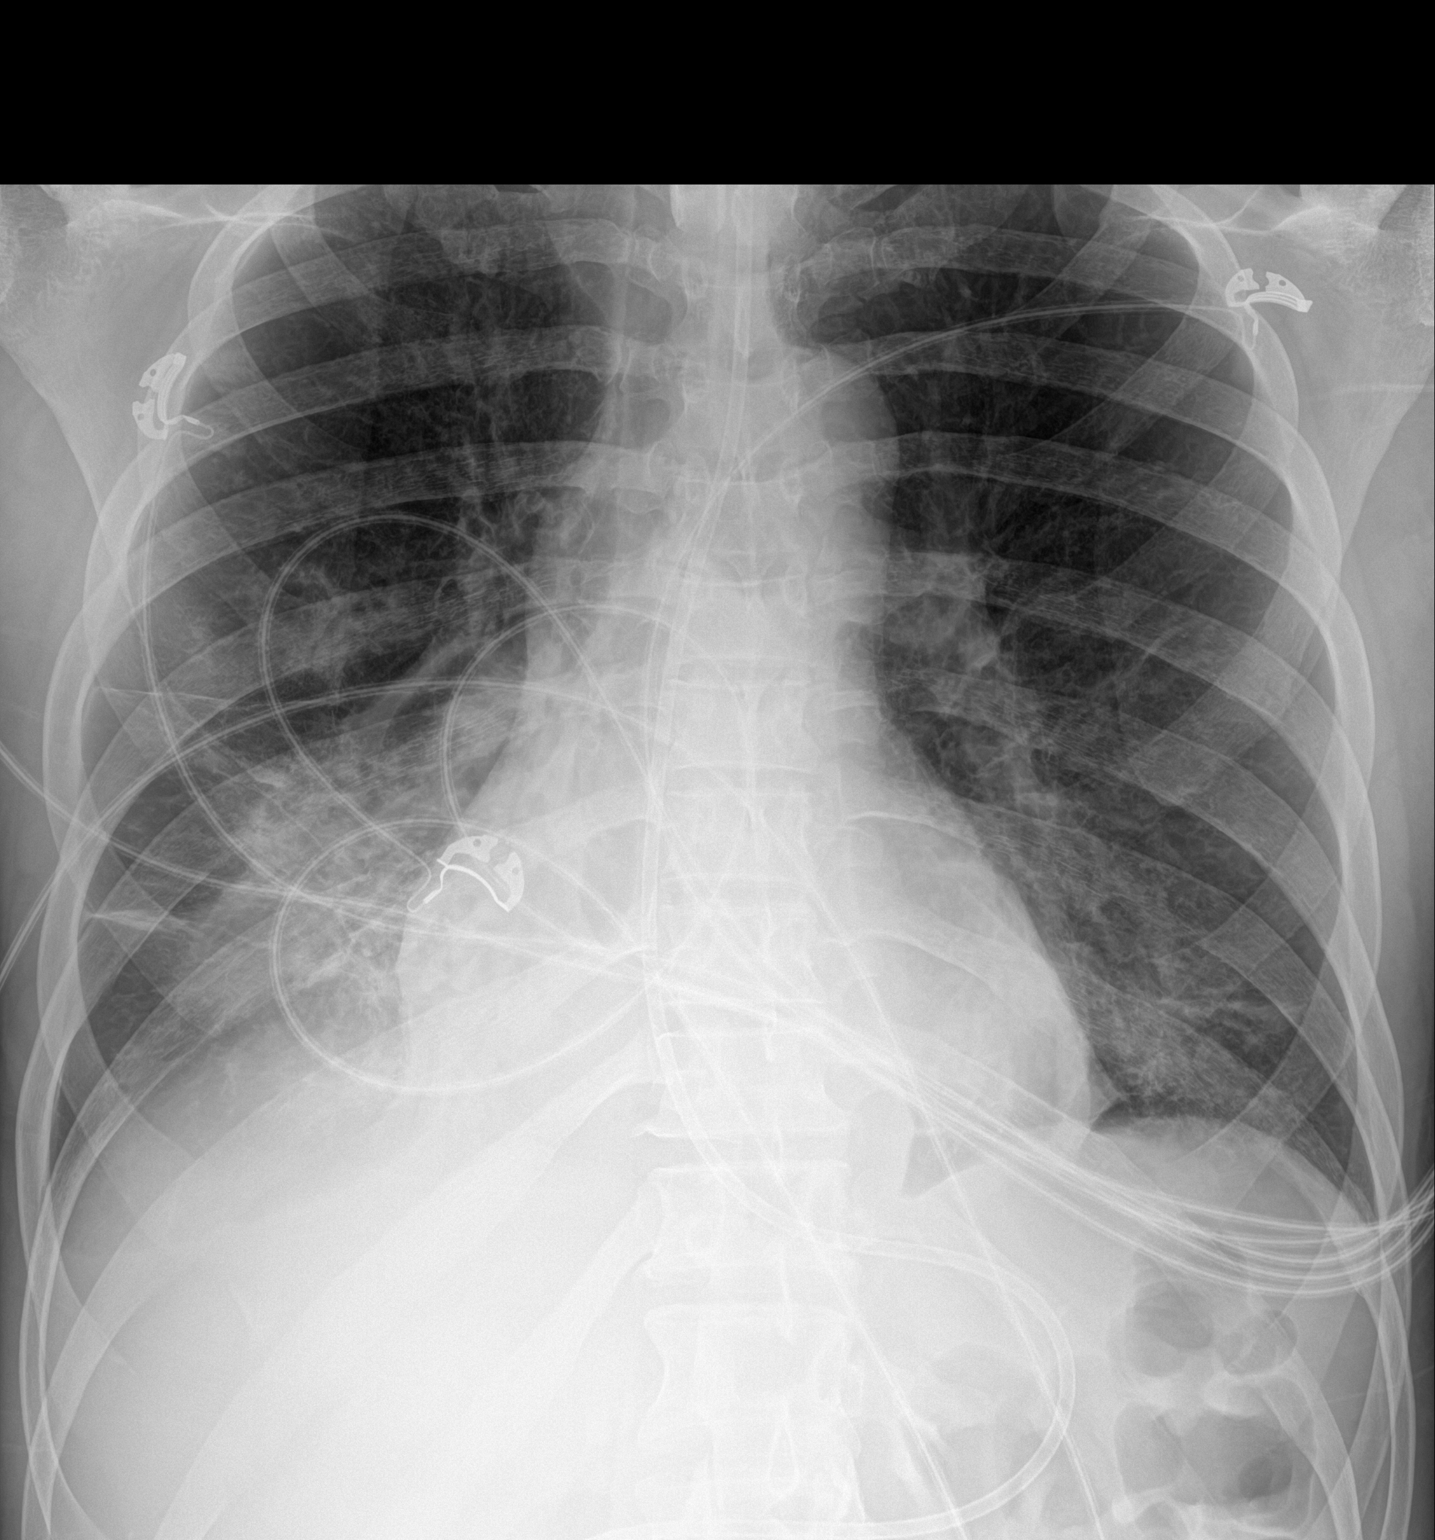

[1 of 1 positions shown; findings below may reference images not displayed]

FINDINGS: Unchanged tracheostomy and feeding tubes. Normal pulmonary
vascularity. Unchanged bibasilar consolidation and small right
pleural effusion. No pneumothorax. No acute osseous abnormality.
IMPRESSION: 1. Unchanged bibasilar consolidation and small right pleural
effusion.

## 2021-01-11 MED ORDER — GUAIFENESIN-DM 100-10 MG/5ML PO SYRP: 5.0000 mL | ORAL_SOLUTION | ORAL | Status: DC | PRN

## 2021-01-11 MED ORDER — SODIUM CHLORIDE 0.9 % IV BOLUS
500.0000 mL | Freq: Once | INTRAVENOUS | Status: AC
  Administered 2021-01-11: 500 mL via INTRAVENOUS

## 2021-01-11 MED ORDER — METOPROLOL TARTRATE 25 MG PO TABS
100.0000 mg | ORAL_TABLET | Freq: Two times a day (BID) | ORAL | Status: DC
  Administered 2021-01-11 – 2021-01-16 (×11): 100 mg
  Filled 2021-01-11 (×5): qty 4
  Filled 2021-01-11: qty 8
  Filled 2021-01-11 (×5): qty 4

## 2021-01-11 MED ORDER — GUAIFENESIN-DM 100-10 MG/5ML PO SYRP
5.0000 mL | ORAL_SOLUTION | ORAL | Status: DC | PRN
  Administered 2021-01-12 – 2021-01-15 (×11): 5 mL
  Filled 2021-01-11 (×12): qty 5

## 2021-01-11 MED ORDER — AMLODIPINE BESYLATE 10 MG PO TABS
10.0000 mg | ORAL_TABLET | Freq: Every day | ORAL | Status: DC
  Administered 2021-01-11 – 2021-01-16 (×6): 10 mg
  Filled 2021-01-11 (×6): qty 1

## 2021-01-11 NOTE — Progress Notes (Signed)
    Pt is planned for perc g tube in IR asap WBC still trending higher Will recheck Mon am  Plan for G tube early next week- if wbc trending down

## 2021-01-11 NOTE — Progress Notes (Signed)
NAME:  Luis Hunter, MRN:  [id number], DOB:  11-08-1975, LOS: 11 ADMISSION DATE:  12/16/2020, CONSULTATION DATE:  8/13 REFERRING MD:  Starleen Blue EDP [facility], CHIEF COMPLAINT:  Seizure   History of Present Illness:  45 y/o male found unresponsive after having multiple seizures.  Intubated for airway protection and loadded with Keppra.  UDS positive for cocaine and THC.  Transferred from [facility] to [facility] for continuous EEG.  Since admission he has had a LHC for an abnormal EKG, but this was felt to be due to severe LVH.  He has been treated with antibiotics for pneumonia.  He has had recurrent fevers for days.   Pertinent  Medical History  Schizophrenia, Cocaine abuse, Bipolar disorder, Depression  Significant Hospital Events: Including procedures, antibiotic start and stop dates in addition to other pertinent events   8/13 intubated, transferred to [facility] 8/20 vent dyssynchrony >> add precedex; EEG with periodic triphasic morphology and generalized slowing 8/23 remains encephalopathic, required escalation of sedatives for vent dyssynchrony  8/28 goals of care conversation: plan tracheostomy  8/29 dilantin stopped by neurology, tracheostomy 8/31 followed commands, stopped precedex but restarted overnight 9/03 changed to pressure control, start clonidine taper 9/7 Vimpat discontinued for concern of DRESS 9/8 HCTZ discontinued for concern of DRESS  Tubes/lines: 8/13 ETT > 8/29 8/29 tracheostomy >   Antibiotics 8/16 started on unasyn 8/17 unasyn discontinued 8/18 started on zosyn 8/25 started augmentin for sinusitis 8/28 Meropenem  Cultures 8/16 respiratory aspirate with gram positive cocci, few gram negative rods 8/26 resp culture > serratia   Procedures/imaging: 8/22 cardiac catheterization for STEMI-no significant coronary artery disease discovered 8/24 MRI-mastoid fullness, no intracerebral abnormality 8/27 on cEEG 8/29 RUQ ultrasound > hepatic hemangioma 8/31 CT head > NAICP 9/6 CT abd  - dense right lower lobe infiltrate, small left lower lobe infiltrate  Interim History / Subjective:   No acute issues overnight. Vimpat stopped yesterday afternoon and HCTZ stopped this AM for concern of DRESS.  Patient had increasing agitation yesterday so klonipin increased to '1mg'$  TID. Clonidine taper ends today  Objective   Blood pressure (!) 124/93, pulse (!) 113, temperature 99.7 F (37.6 C), temperature source Oral, resp. rate 20, height '5\' 11"'$  (1.803 m), weight 72 kg, SpO2 97 %.    Vent Mode: PCV FiO2 (%):  [40 %] 40 % Set Rate:  [16 bmp] 16 bmp PEEP:  [5 cmH20] 5 cmH20 Pressure Support:  [8 cmH20] 8 cmH20 Plateau Pressure:  [12 cmH20-27 cmH20] 27 cmH20   Intake/Output Summary (Last 24 hours) at 01/11/2021 0744 Last data filed at 01/11/2021 0700 Gross per 24 hour  Intake 2045 ml  Output 1970 ml  Net 75 ml   Filed Weights   01/09/21 0236 01/10/21 0500 01/11/21 0500  Weight: 74.8 kg 71.8 kg 72 kg    Examination:  General - no acute distress, chronically ill appearing, thin male Eyes - pupils reactive, sclera anicteric ENT - trach site clean Cardiac - regular rate/rhythm, no murmur Chest - breath sounds clear bilaterally Abdomen - soft, non tender, + bowel sounds Extremities - no cyanosis, clubbing, or edema Skin - no rashes Neuro - intermittent head and Rt arm tremor  Resolved Hospital Problem list   Hypernatremia HCAP with Serratia and Enterobacter  Assessment & Plan:   Acute metabolic encephalopathy. Polysubstance abuse with hx of depression, bipolar, and schizophrenia. Epilepsy. - on going altered mentation due to post ictal state, polysubstance abuse and underlying mental health disorder.  - clonidine taper ended today -  klonopin 1 mg q8h - continue remeron, abilify - continue keppra, stopping vimpat due to concern for DRESS  Acute hypoxemic respiratory failure from HCAP. Presumptive diagnosis of asthma. 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 - pressure  support trials - f/u CXR intermittently - bronchial hygiene - goal SpO2 > 92% - continue pulmicort, brovana, yupelri with prn albuterol - Dense lower lobe infiltrates concerning for aspiration vs organizing pneumonia. Improving on CXR 9/8  ?DRESS Leukocytosis Peripheral Eosinophilia - peripheral eosinophil count has trended up since admission, but down today - Vimpat stopped 9/7 and and HCTZ stopped 9/8 - Will continue to monitor CBC with diff daily  Hypertension. - goal SBP < 140, DBP < 95 - decreased dose of norvasc while on clonidine - continue HCTZ, lopressor  Hyponatremia - free water was stopped yesterday morning - HCTZ started 8/31 - check BMP today, will consider stopping HCTZ  Elevated LFTs from shock and hypoxia, now possible related to DRESS Liver hemangioma. - Will need outpatient repeat imaging of hepatic hemangioma - f/u LFT intermittently  Dysphagia. - IR evaluating for PEG placement, waiting for WBC count to trend down before placement  Disposition. - will likely need LTACH  Constipation - last bowel movement 9/8 - miralax BID  Best Practice (right click and "Reselect all SmartList Selections" daily)   Diet/type: tubefeeds DVT prophylaxis: LMWH GI prophylaxis: PPI Lines: N/A Foley:  N/A Code Status:  full code Last date of multidisciplinary goals of care discussion [8/28]  Labs    CMP Latest Ref Rng & Units 01/10/2021 01/09/2021 01/07/2021  Glucose 70 - 99 mg/dL 131(H) 128(H) 125(H)  BUN 6 - 20 mg/dL 27(H) 26(H) 20  Creatinine 0.61 - 1.24 mg/dL 0.55(L) 0.56(L) 0.66  Sodium 135 - 145 mmol/L 131(L) 130(L) 133(L)  Potassium 3.5 - 5.1 mmol/L 4.5 4.6 5.0  Chloride 98 - 111 mmol/L 97(L) 95(L) 98  CO2 22 - 32 mmol/L '24 26 27  '$ Calcium 8.9 - 10.3 mg/dL 9.2 8.8(L) 8.4(L)  Total Protein 6.5 - 8.1 g/dL - 8.0 -  Total Bilirubin 0.3 - 1.2 mg/dL - 0.8 -  Alkaline Phos 38 - 126 U/L - 197(H) -  AST 15 - 41 U/L - 140(H) -  ALT 0 - 44 U/L - 427(H) -    CBC  Latest Ref Rng & Units 01/09/2021 01/09/2021 01/07/2021  WBC 4.0 - 10.5 K/uL 24.0(H) 23.5(H) 18.5(H)  Hemoglobin 13.0 - 17.0 g/dL 9.2(L) 10.6(L) 9.0(L)  Hematocrit 39.0 - 52.0 % 28.1(L) 31.8(L) 27.3(L)  Platelets 150 - 400 K/uL 321 358 308    CBG (last 3)  Recent Labs    01/10/21 1530 01/10/21 1937 01/11/21 0737  GLUCAP 119* 120* 125*    ABG    Component Value Date/Time   PHART 7.366 12/31/2020 2242   PCO2ART 59.2 (H) 12/31/2020 2242   PO2ART 100 12/31/2020 2242   HCO3 33.8 (H) 12/31/2020 2242   TCO2 36 (H) 12/31/2020 2242   ACIDBASEDEF 1.0 12/21/2020 1101   O2SAT 97.0 12/31/2020 2242    Critical Care time: 40 minutes   Freda Jackson, MD [facility] Office: [phone]   See Amion for personal pager PCCM on call pager [phone] until 7pm. Please call Elink 7p-7a. [phone]

## 2021-01-11 NOTE — Progress Notes (Signed)
Assisted tele visit to patient with partner.  Sedonia Small, RN

## 2021-01-12 LAB — CBC WITH DIFFERENTIAL/PLATELET
Abs Immature Granulocytes: 1.19 10*3/uL — ABNORMAL HIGH (ref 0.00–0.07)
Basophils Absolute: 0.2 10*3/uL — ABNORMAL HIGH (ref 0.0–0.1)
Basophils Relative: 1 %
Eosinophils Absolute: 0.8 10*3/uL — ABNORMAL HIGH (ref 0.0–0.5)
Eosinophils Relative: 4 %
HCT: 31.9 % — ABNORMAL LOW (ref 39.0–52.0)
Hemoglobin: 10.3 g/dL — ABNORMAL LOW (ref 13.0–17.0)
Immature Granulocytes: 5 %
Lymphocytes Relative: 6 %
Lymphs Abs: 1.5 10*3/uL (ref 0.7–4.0)
MCH: 28.5 pg (ref 26.0–34.0)
MCHC: 32.3 g/dL (ref 30.0–36.0)
MCV: 88.4 fL (ref 80.0–100.0)
Monocytes Absolute: 2 10*3/uL — ABNORMAL HIGH (ref 0.1–1.0)
Monocytes Relative: 8 %
Neutro Abs: 18.2 10*3/uL — ABNORMAL HIGH (ref 1.7–7.7)
Neutrophils Relative %: 76 %
Platelets: 400 10*3/uL (ref 150–400)
RBC: 3.61 MIL/uL — ABNORMAL LOW (ref 4.22–5.81)
RDW: 15.1 % (ref 11.5–15.5)
WBC: 23.8 10*3/uL — ABNORMAL HIGH (ref 4.0–10.5)
nRBC: 0 % (ref 0.0–0.2)

## 2021-01-12 LAB — GLUCOSE, CAPILLARY
Glucose-Capillary: 109 mg/dL — ABNORMAL HIGH (ref 70–99)
Glucose-Capillary: 129 mg/dL — ABNORMAL HIGH (ref 70–99)
Glucose-Capillary: 124 mg/dL — ABNORMAL HIGH (ref 70–99)
Glucose-Capillary: 111 mg/dL — ABNORMAL HIGH (ref 70–99)
Glucose-Capillary: 129 mg/dL — ABNORMAL HIGH (ref 70–99)
Glucose-Capillary: 121 mg/dL — ABNORMAL HIGH (ref 70–99)

## 2021-01-12 LAB — BASIC METABOLIC PANEL
Anion gap: 9 (ref 5–15)
BUN: 28 mg/dL — ABNORMAL HIGH (ref 6–20)
CO2: 27 mmol/L (ref 22–32)
Calcium: 8.8 mg/dL — ABNORMAL LOW (ref 8.9–10.3)
Chloride: 98 mmol/L (ref 98–111)
Creatinine, Ser: 0.54 mg/dL — ABNORMAL LOW (ref 0.61–1.24)
GFR, Estimated: >60 mL/min (ref >60)
Glucose, Bld: 137 mg/dL — ABNORMAL HIGH (ref 70–99)
Potassium: 3.8 mmol/L (ref 3.5–5.1)
Sodium: 134 mmol/L — ABNORMAL LOW (ref 135–145)

## 2021-01-12 LAB — MAGNESIUM: Magnesium: 1.9 mg/dL (ref 1.7–2.4)

## 2021-01-12 LAB — PHOSPHORUS: Phosphorus: 4.1 mg/dL (ref 2.5–4.6)

## 2021-01-12 NOTE — Progress Notes (Addendum)
NAME:  Luis Hunter, MRN:  [id number], DOB:  1975-06-22, LOS: 71 ADMISSION DATE:  12/16/2020, CONSULTATION DATE:  8/13 REFERRING MD:  Starleen Blue EDP [facility], CHIEF COMPLAINT:  Seizure   History of Present Illness:  45 y/o male found unresponsive after having multiple seizures.  Intubated for airway protection and loadded with Keppra.  UDS positive for cocaine and THC.  Transferred from [facility] to [facility] for continuous EEG.  Since admission he has had a LHC for an abnormal EKG, but this was felt to be due to severe LVH.  He has been treated with antibiotics for pneumonia.  He has had recurrent fevers for days.   Pertinent  Medical History  Schizophrenia, Cocaine abuse, Bipolar disorder, Depression  Significant Hospital Events: Including procedures, antibiotic start and stop dates in addition to other pertinent events   8/13 intubated, transferred to [facility] 8/20 vent dyssynchrony >> add precedex; EEG with periodic triphasic morphology and generalized slowing 8/23 remains encephalopathic, required escalation of sedatives for vent dyssynchrony  8/28 goals of care conversation: plan tracheostomy  8/29 dilantin stopped by neurology, tracheostomy 8/31 followed commands, stopped precedex but restarted overnight 9/03 changed to pressure control, start clonidine taper 9/7 Vimpat discontinued for concern of DRESS 9/8 HCTZ discontinued for concern of DRESS  Tubes/lines: 8/13 ETT > 8/29 8/29 tracheostomy >   Antibiotics 8/16 started on unasyn 8/17 unasyn discontinued 8/18 started on zosyn 8/25 started augmentin for sinusitis 8/28 > 9/3 Meropenem  Cultures 8/16 respiratory aspirate with gram positive cocci, few gram negative rods 8/26 resp culture > serratia   Procedures/imaging: 8/22 cardiac catheterization for STEMI-no significant coronary artery disease discovered 8/24 MRI-mastoid fullness, no intracerebral abnormality 8/27 on cEEG 8/29 RUQ ultrasound > hepatic hemangioma 8/31 CT head > NAICP 9/6  CT abd - dense right lower lobe infiltrate, small left lower lobe infiltrate  Interim History / Subjective:   Tolerating pressure support weans No new events overnight  Objective   Blood pressure 110/82, pulse 100, temperature 98.9 F (37.2 C), temperature source Oral, resp. rate 16, height '5\' 11"'$  (1.803 m), weight 71.9 kg, SpO2 99 %.    Vent Mode: PCV FiO2 (%):  [40 %] 40 % Set Rate:  [16 bmp] 16 bmp PEEP:  [5 cmH20] 5 cmH20 Pressure Support:  [10 cmH20] 10 cmH20 Plateau Pressure:  [15 cmH20-22 cmH20] 16 cmH20   Intake/Output Summary (Last 24 hours) at 01/12/2021 0953 Last data filed at 01/12/2021 0900 Gross per 24 hour  Intake 1560 ml  Output 755 ml  Net 805 ml   Filed Weights   01/10/21 0500 01/11/21 0500 01/12/21 0500  Weight: 71.8 kg 72 kg 71.9 kg    Examination: Gen:      No acute distress, chronically ill apprearing HEENT:  EOMI, sclera anicteric Neck:     No masses; no thyromegaly, trach Lungs:    Clear to auscultation bilaterally; normal respiratory effort CV:         Regular rate and rhythm; no murmurs Abd:      + bowel sounds; soft, non-tender; no palpable masses, no distension Ext:    No edema; adequate peripheral perfusion Skin:      Warm and dry; no rash Neuro: Sedated, arousable and follows commands  Labs/imaging reviewed Significant for BUN/creatinine 28/0.54 WBC 19.8, hemoglobin 10.5, platelets 400  Resolved Hospital Problem list   Hypernatremia HCAP with Serratia and Enterobacter  Assessment & Plan:   Acute metabolic encephalopathy. Polysubstance abuse with hx of depression, bipolar, and schizophrenia. Epilepsy. -  on going altered mentation due to post ictal state, polysubstance abuse and underlying mental health disorder.  - clonidine taper ended 9/8 - klonopin 1 mg q8h - continue remeron, abilify - continue keppra, vimpat stopped 9/7 due to concern for DRESS  Acute hypoxemic respiratory failure from HCAP. Serratia, Enterobacter  pneumonia Presumptive diagnosis of asthma. Failure to wean s/p tracheostomy Finished antibiotics on 9/3 PSV weans as tolerated Follow intermittent chest x-ray Continue Pulmicort, Brovana and Yupelri, as needed albuterol  ?DRESS Leukocytosis Peripheral Eosinophilia - peripheral eosinophil count has trended up since admission with down trend since holding vimpat then HCTZ - Vimpat stopped 9/7 and and HCTZ stopped 9/8 - Will continue to monitor CBC with diff daily  Hypertension. - goal SBP < 140, DBP < 95 - Continue norvasc and lopressor  Hyponatremia - free water was stopped 9/7 and HCTZ stopped 9/8 Na improved to 134  Elevated LFTs from shock and hypoxia, now possible related to DRESS Liver hemangioma. - Will need outpatient repeat imaging of hepatic hemangioma - f/u LFT intermittently  Dysphagia. - IR evaluating for PEG placement, waiting for WBC count to trend down before placement  Disposition. - will likely need LTACH  Constipation - miralax BID  Best Practice (right click and "Reselect all SmartList Selections" daily)   Diet/type: tubefeeds DVT prophylaxis: LMWH GI prophylaxis: PPI Lines: N/A Foley:  N/A Code Status:  full code Last date of multidisciplinary goals of care discussion [8/28]  Critical Care time:    The patient is critically ill with multiple organ system failure and requires high complexity decision making for assessment and support, frequent evaluation and titration of therapies, advanced monitoring, review of radiographic studies and interpretation of complex data.   Critical Care Time devoted to patient care services, exclusive of separately billable procedures, described in this note is 45 minutes.   Marshell Garfinkel MD [facility] See Amion for pager  If no response to pager , please call [phone] until 7pm After 7:00 pm call Elink  [phone] 01/13/2021, 8:15 AM

## 2021-01-12 NOTE — Progress Notes (Signed)
Assisted tele visit to patient with partner.  Sedonia Small, RN

## 2021-01-12 NOTE — Progress Notes (Signed)
RT attempted SBT with pt. Pt placed on CPAP/PS 10/5 40%. Pt with adequate volumes but periods of apnea despite being awake. Pt placed back on full support settings at this time. Will attempt SBT again at a later time. RN notified, RT will continue to monitor and be available.

## 2021-01-12 NOTE — TOC Progression Note (Signed)
Transition of Care [facility]) - Progression Note    Patient Details  Name: Luis Hunter MRN: [id number] Date of Birth: January 18, 1976  [facility]) CM/SW [place], RN Phone Number: 01/12/2021, 5:58 PM  Clinical Narrative:     Patient continues intubated lined, PEG will be placed. Tracheostomy inserted on 8/29. Assess for potential LTAC<PT recommended ltac v SNF,however patient is encephalopathic, with periods of dyssynchrony  with periods of Apnea on SBT and the dyssynchrony requiring Precedex at times, however he has made some strides in mentation in the last few days. . Recommend   Palliative care need to involvement ,for GOC and  in messaging with Nursing, he has a father and sister making decisions.   Questioning DRESS,esonophils 4 absolute esonophils slightly elevated.   Patient had myriad complexities that continue to impact his placement for discharge  from ICU.  CM /CSW will follow for needs, recommendations and transitions.   Expected Discharge Plan: [place] Barriers to Discharge: Continued Medical Work up  Expected Discharge Plan and Services Expected Discharge Plan: [place]   Discharge Planning Services: CM Consult   Living arrangements for the past 2 months: Single Family Home                                       Social Determinants of Health (SDOH) Interventions    Readmission Risk Interventions No flowsheet data found.

## 2021-01-13 DIAGNOSIS — L8990 Pressure ulcer of unspecified site, unspecified stage: Secondary | ICD-10-CM | POA: Insufficient documentation

## 2021-01-13 LAB — CBC WITH DIFFERENTIAL/PLATELET
Abs Immature Granulocytes: 0.96 10*3/uL — ABNORMAL HIGH (ref 0.00–0.07)
Basophils Absolute: 0.2 10*3/uL — ABNORMAL HIGH (ref 0.0–0.1)
Basophils Relative: 1 %
Eosinophils Absolute: 0.8 10*3/uL — ABNORMAL HIGH (ref 0.0–0.5)
Eosinophils Relative: 4 %
HCT: 32.1 % — ABNORMAL LOW (ref 39.0–52.0)
Hemoglobin: 10.5 g/dL — ABNORMAL LOW (ref 13.0–17.0)
Immature Granulocytes: 5 %
Lymphocytes Relative: 7 %
Lymphs Abs: 1.4 10*3/uL (ref 0.7–4.0)
MCH: 28.9 pg (ref 26.0–34.0)
MCHC: 32.7 g/dL (ref 30.0–36.0)
MCV: 88.4 fL (ref 80.0–100.0)
Monocytes Absolute: 1.4 10*3/uL — ABNORMAL HIGH (ref 0.1–1.0)
Monocytes Relative: 7 %
Neutro Abs: 15.1 10*3/uL — ABNORMAL HIGH (ref 1.7–7.7)
Neutrophils Relative %: 76 %
Platelets: 400 10*3/uL (ref 150–400)
RBC: 3.63 MIL/uL — ABNORMAL LOW (ref 4.22–5.81)
RDW: 14.9 % (ref 11.5–15.5)
WBC: 19.8 10*3/uL — ABNORMAL HIGH (ref 4.0–10.5)
nRBC: 0 % (ref 0.0–0.2)

## 2021-01-13 LAB — GLUCOSE, CAPILLARY
Glucose-Capillary: 132 mg/dL — ABNORMAL HIGH (ref 70–99)
Glucose-Capillary: 109 mg/dL — ABNORMAL HIGH (ref 70–99)
Glucose-Capillary: 110 mg/dL — ABNORMAL HIGH (ref 70–99)
Glucose-Capillary: 102 mg/dL — ABNORMAL HIGH (ref 70–99)
Glucose-Capillary: 108 mg/dL — ABNORMAL HIGH (ref 70–99)
Glucose-Capillary: 117 mg/dL — ABNORMAL HIGH (ref 70–99)

## 2021-01-13 MED ORDER — WHITE PETROLATUM EX OINT
TOPICAL_OINTMENT | CUTANEOUS | Status: AC
  Administered 2021-01-13: 0.2
  Filled 2021-01-13: qty 28.35

## 2021-01-13 NOTE — Progress Notes (Signed)
Assisted tele visit to patient with family member.  Luis Hunter M, RN  

## 2021-01-13 NOTE — Progress Notes (Signed)
Assisted tele visit to patient with significant other.  Lenox Ahr, RN

## 2021-01-14 LAB — GLUCOSE, CAPILLARY
Glucose-Capillary: 105 mg/dL — ABNORMAL HIGH (ref 70–99)
Glucose-Capillary: 112 mg/dL — ABNORMAL HIGH (ref 70–99)
Glucose-Capillary: 120 mg/dL — ABNORMAL HIGH (ref 70–99)
Glucose-Capillary: 113 mg/dL — ABNORMAL HIGH (ref 70–99)
Glucose-Capillary: 111 mg/dL — ABNORMAL HIGH (ref 70–99)
Glucose-Capillary: 109 mg/dL — ABNORMAL HIGH (ref 70–99)

## 2021-01-14 LAB — BASIC METABOLIC PANEL
Anion gap: 12 (ref 5–15)
BUN: 33 mg/dL — ABNORMAL HIGH (ref 6–20)
CO2: 26 mmol/L (ref 22–32)
Calcium: 9.1 mg/dL (ref 8.9–10.3)
Chloride: 101 mmol/L (ref 98–111)
Creatinine, Ser: 0.46 mg/dL — ABNORMAL LOW (ref 0.61–1.24)
GFR, Estimated: >60 mL/min (ref >60)
Glucose, Bld: 111 mg/dL — ABNORMAL HIGH (ref 70–99)
Potassium: 4.6 mmol/L (ref 3.5–5.1)
Sodium: 139 mmol/L (ref 135–145)

## 2021-01-14 LAB — CBC WITH DIFFERENTIAL/PLATELET
Abs Immature Granulocytes: 0.87 10*3/uL — ABNORMAL HIGH (ref 0.00–0.07)
Basophils Absolute: 0.1 10*3/uL (ref 0.0–0.1)
Basophils Relative: 1 %
Eosinophils Absolute: 0.9 10*3/uL — ABNORMAL HIGH (ref 0.0–0.5)
Eosinophils Relative: 5 %
HCT: 32.5 % — ABNORMAL LOW (ref 39.0–52.0)
Hemoglobin: 10.7 g/dL — ABNORMAL LOW (ref 13.0–17.0)
Immature Granulocytes: 5 %
Lymphocytes Relative: 9 %
Lymphs Abs: 1.7 10*3/uL (ref 0.7–4.0)
MCH: 29.5 pg (ref 26.0–34.0)
MCHC: 32.9 g/dL (ref 30.0–36.0)
MCV: 89.5 fL (ref 80.0–100.0)
Monocytes Absolute: 1.5 10*3/uL — ABNORMAL HIGH (ref 0.1–1.0)
Monocytes Relative: 9 %
Neutro Abs: 12.9 10*3/uL — ABNORMAL HIGH (ref 1.7–7.7)
Neutrophils Relative %: 71 %
Platelets: 418 10*3/uL — ABNORMAL HIGH (ref 150–400)
RBC: 3.63 MIL/uL — ABNORMAL LOW (ref 4.22–5.81)
RDW: 14.9 % (ref 11.5–15.5)
WBC: 18 10*3/uL — ABNORMAL HIGH (ref 4.0–10.5)
nRBC: 0 % (ref 0.0–0.2)

## 2021-01-14 LAB — PHOSPHORUS: Phosphorus: 4.5 mg/dL (ref 2.5–4.6)

## 2021-01-14 LAB — MAGNESIUM: Magnesium: 2.1 mg/dL (ref 1.7–2.4)

## 2021-01-14 MED ORDER — POLYVINYL ALCOHOL 1.4 % OP SOLN
1.0000 [drp] | OPHTHALMIC | Status: DC | PRN
  Administered 2021-01-14: 1 [drp] via OPHTHALMIC
  Filled 2021-01-14: qty 15

## 2021-01-14 NOTE — Progress Notes (Signed)
   IR aware of this pt Awaiting wbc to improve to safely place percutaneous G tube  WBC coming down over weekend-- now at [place] for probable g tube 9/13 in IR Will recheck in am--if still trending down - will prepare for same

## 2021-01-14 NOTE — Progress Notes (Signed)
eLink Physician-Brief Progress Note Patient Name: Luis Hunter DOB: 1975-08-25 MRN: [id number]   Date of Service  01/14/2021  HPI/Events of Note  Patient with dry eyes.  eICU Interventions  PRN artificial tears ordered.        Kerry Kass Lindell Tussey 01/14/2021, 9:17 PM

## 2021-01-14 NOTE — Progress Notes (Signed)
NAME:  Luis Hunter, MRN:  [id number], DOB:  1975/09/07, LOS: 65 ADMISSION DATE:  12/16/2020, CONSULTATION DATE:  8/13 REFERRING MD:  Starleen Blue EDP [facility], CHIEF COMPLAINT:  Seizure   History of Present Illness:  45 y/o male found unresponsive after having multiple seizures.  Intubated for airway protection and loadded with Keppra.  UDS positive for cocaine and THC.  Transferred from [facility] to [facility] for continuous EEG.  Since admission he has had a LHC for an abnormal EKG, but this was felt to be due to severe LVH.  He has been treated with antibiotics for pneumonia.  He has had recurrent fevers for days.   Pertinent  Medical History  Schizophrenia, Cocaine abuse, Bipolar disorder, Depression  Significant Hospital Events: Including procedures, antibiotic start and stop dates in addition to other pertinent events   8/13 intubated, transferred to [facility] 8/20 vent dyssynchrony >> add precedex; EEG with periodic triphasic morphology and generalized slowing 8/23 remains encephalopathic, required escalation of sedatives for vent dyssynchrony  8/28 goals of care conversation: plan tracheostomy  8/29 dilantin stopped by neurology, tracheostomy 8/31 followed commands, stopped precedex but restarted overnight 9/03 changed to pressure control, start clonidine taper 9/7 Vimpat discontinued for concern of DRESS 9/8 HCTZ discontinued for concern of DRESS 9/10 tolerating pressure support wean   Tubes/lines: 8/13 ETT > 8/29 8/29 tracheostomy >   Antibiotics 8/16 started on unasyn 8/17 unasyn discontinued 8/18 started on zosyn 8/25 started augmentin for sinusitis 8/28 > 9/3 Meropenem  Cultures 8/18 respiratory culture> Enterobacter 8/18, 8/24, 9/1 respiratory culture with Serratia   Procedures/imaging: 8/22 cardiac catheterization for STEMI-no significant coronary artery disease discovered 8/24 MRI-mastoid fullness, no intracerebral abnormality 8/27 on cEEG 8/29 RUQ ultrasound > hepatic  hemangioma 8/31 CT head > NAICP 9/6 CT abd - dense right lower lobe infiltrate, small left lower lobe infiltrate  Interim History / Subjective:   Tolerated pressure support weans for 2 hours.  Rested on the vent overnight.  No acute events.  Objective   Blood pressure (!) 137/92, pulse 97, temperature 98.1 F (36.7 C), temperature source Axillary, resp. rate 19, height '5\' 11"'$  (1.803 m), weight 70.5 kg, SpO2 100 %.    Vent Mode: PSV;CPAP FiO2 (%):  [40 %] 40 % Set Rate:  [16 bmp] 16 bmp Vt Set:  [600 mL] 600 mL PEEP:  [5 cmH20] 5 cmH20 Pressure Support:  [10 cmH20] 10 cmH20 Plateau Pressure:  [9 cmH20-18 cmH20] 18 cmH20   Intake/Output Summary (Last 24 hours) at 01/14/2021 0856 Last data filed at 01/14/2021 0700 Gross per 24 hour  Intake 1595 ml  Output 1400 ml  Net 195 ml   Filed Weights   01/12/21 0500 01/13/21 0208 01/14/21 0121  Weight: 71.9 kg 70.8 kg 70.5 kg    Examination: Gen:      No acute distress, chronically ill-appearing HEENT:  EOMI, sclera anicteric Neck:     No masses; no thyromegaly, tracheostomy Lungs:    Clear to auscultation bilaterally; normal respiratory effort CV:         Regular rate and rhythm; no murmurs Abd:      + bowel sounds; soft, non-tender; no palpable masses, no distension Ext:    No edema; adequate peripheral perfusion Skin:      Warm and dry; no rash Neuro: Sedated  Labs/imaging reviewed Significant for BUN/creatinine 33/0.46 WBC count 18, hemoglobin 10.7 No new imaging  Resolved Hospital Problem list   Hypernatremia HCAP with Serratia and Enterobacter  Assessment & Plan:  Acute metabolic encephalopathy. Polysubstance abuse with hx of depression, bipolar, and schizophrenia. Epilepsy. - on going altered mentation due to post ictal state, polysubstance abuse and underlying mental health disorder.  - clonidine taper ended 9/8 - klonopin 1 mg q8h - continue remeron, abilify - continue keppra, vimpat stopped 9/7 due to concern  for DRESS  Acute hypoxemic respiratory failure from HCAP. Serratia, Enterobacter pneumonia Presumptive diagnosis of asthma. Failure to wean s/p tracheostomy Finished antibiotics on 9/3.  He continues to have persistent cultures with Serratia and is probably colonized at this point PSV weans as tolerated Follow intermittent chest x-ray Continue Pulmicort, Brovana and Yupelri, as needed albuterol  ?DRESS Leukocytosis.  Improving Peripheral Eosinophilia - peripheral eosinophil count has trended up since admission with down trend since holding vimpat then HCTZ - Vimpat stopped 9/7 and and HCTZ stopped 9/8 - Will continue to monitor CBC with diff daily  Hypertension. - goal SBP < 140, DBP < 95 - Continue norvasc and lopressor  Hyponatremia - free water was stopped 9/7 and HCTZ stopped 9/8 Na improved to 134  Elevated LFTs from shock and hypoxia, now possible related to DRESS Liver hemangioma. - Will need outpatient repeat imaging of hepatic hemangioma - f/u LFT intermittently  Dysphagia. - IR evaluating for PEG placement, waiting for WBC count to trend down before placement  Disposition. - will likely need LTACH  Constipation - miralax BID  Best Practice (right click and "Reselect all SmartList Selections" daily)   Diet/type: tubefeeds DVT prophylaxis: LMWH GI prophylaxis: PPI Lines: N/A Foley:  N/A Code Status:  full code Last date of multidisciplinary goals of care discussion [8/28]  Critical Care time:    The patient is critically ill with multiple organ system failure and requires high complexity decision making for assessment and support, frequent evaluation and titration of therapies, advanced monitoring, review of radiographic studies and interpretation of complex data.   Critical Care Time devoted to patient care services, exclusive of separately billable procedures, described in this note is 35 minutes.   Marshell Garfinkel MD [place] [facility] See Amion for pager  If no response to pager , please call [phone] until 7pm After 7:00 pm call Elink  [phone] 01/14/2021, 9:00 AM

## 2021-01-14 NOTE — Progress Notes (Signed)
Assisted girlfriend with video call

## 2021-01-14 NOTE — Progress Notes (Signed)
Assisted tele visit to patient with girl friend  Lenox Ahr, RN

## 2021-01-15 LAB — COMPREHENSIVE METABOLIC PANEL
ALT: 232 U/L — ABNORMAL HIGH (ref 0–44)
AST: 93 U/L — ABNORMAL HIGH (ref 15–41)
Albumin: 2.3 g/dL — ABNORMAL LOW (ref 3.5–5.0)
Alkaline Phosphatase: 201 U/L — ABNORMAL HIGH (ref 38–126)
Anion gap: 10 (ref 5–15)
BUN: 32 mg/dL — ABNORMAL HIGH (ref 6–20)
CO2: 25 mmol/L (ref 22–32)
Calcium: 9 mg/dL (ref 8.9–10.3)
Chloride: 102 mmol/L (ref 98–111)
Creatinine, Ser: 0.54 mg/dL — ABNORMAL LOW (ref 0.61–1.24)
GFR, Estimated: >60 mL/min (ref >60)
Glucose, Bld: 126 mg/dL — ABNORMAL HIGH (ref 70–99)
Potassium: 4.1 mmol/L (ref 3.5–5.1)
Sodium: 137 mmol/L (ref 135–145)
Total Bilirubin: 0.7 mg/dL (ref 0.3–1.2)
Total Protein: 7.7 g/dL (ref 6.5–8.1)

## 2021-01-15 LAB — CBC WITH DIFFERENTIAL/PLATELET
Abs Immature Granulocytes: 0.81 10*3/uL — ABNORMAL HIGH (ref 0.00–0.07)
Basophils Absolute: 0.1 10*3/uL (ref 0.0–0.1)
Basophils Relative: 1 %
Eosinophils Absolute: 1.2 10*3/uL — ABNORMAL HIGH (ref 0.0–0.5)
Eosinophils Relative: 7 %
HCT: 31.5 % — ABNORMAL LOW (ref 39.0–52.0)
Hemoglobin: 10.2 g/dL — ABNORMAL LOW (ref 13.0–17.0)
Immature Granulocytes: 5 %
Lymphocytes Relative: 10 %
Lymphs Abs: 1.7 10*3/uL (ref 0.7–4.0)
MCH: 29.1 pg (ref 26.0–34.0)
MCHC: 32.4 g/dL (ref 30.0–36.0)
MCV: 90 fL (ref 80.0–100.0)
Monocytes Absolute: 1.3 10*3/uL — ABNORMAL HIGH (ref 0.1–1.0)
Monocytes Relative: 8 %
Neutro Abs: 11.6 10*3/uL — ABNORMAL HIGH (ref 1.7–7.7)
Neutrophils Relative %: 69 %
Platelets: 392 10*3/uL (ref 150–400)
RBC: 3.5 MIL/uL — ABNORMAL LOW (ref 4.22–5.81)
RDW: 14.9 % (ref 11.5–15.5)
WBC: 16.7 10*3/uL — ABNORMAL HIGH (ref 4.0–10.5)
nRBC: 0 % (ref 0.0–0.2)

## 2021-01-15 LAB — PHOSPHORUS: Phosphorus: 4.4 mg/dL (ref 2.5–4.6)

## 2021-01-15 LAB — MAGNESIUM: Magnesium: 2 mg/dL (ref 1.7–2.4)

## 2021-01-15 LAB — GLUCOSE, CAPILLARY
Glucose-Capillary: 118 mg/dL — ABNORMAL HIGH (ref 70–99)
Glucose-Capillary: 112 mg/dL — ABNORMAL HIGH (ref 70–99)
Glucose-Capillary: 101 mg/dL — ABNORMAL HIGH (ref 70–99)
Glucose-Capillary: 95 mg/dL (ref 70–99)
Glucose-Capillary: 105 mg/dL — ABNORMAL HIGH (ref 70–99)

## 2021-01-15 LAB — ANCA TITERS
Atypical P-ANCA titer: <1:20 {titer}
C-ANCA: <1:20 {titer}
P-ANCA: <1:20 {titer}

## 2021-01-15 MED ORDER — IOHEXOL 300 MG/ML  SOLN: 75.0000 mL | Freq: Once | INTRAMUSCULAR | Status: DC | PRN

## 2021-01-15 MED ORDER — CEFAZOLIN SODIUM-DEXTROSE 2-4 GM/100ML-% IV SOLN
2.0000 g | INTRAVENOUS | Status: AC
  Filled 2021-01-15: qty 100

## 2021-01-15 MED ORDER — IOHEXOL 300 MG/ML  SOLN
75.0000 mL | Freq: Once | INTRAMUSCULAR | Status: AC
  Administered 2021-01-15: 75 mL

## 2021-01-15 MED ORDER — INFLUENZA VAC SPLIT QUAD 0.5 ML IM SUSY
0.5000 mL | PREFILLED_SYRINGE | INTRAMUSCULAR | Status: AC | PRN
  Administered 2021-01-16: 0.5 mL via INTRAMUSCULAR
  Filled 2021-01-15: qty 0.5

## 2021-01-15 NOTE — Progress Notes (Signed)
Pt placed back on full support for the night. Will continue to monitor.

## 2021-01-15 NOTE — Progress Notes (Signed)
Patient is tentatively scheduled for G tube placement with IR tomorrow pending IR schedule.   Pt needs 75 mg Omnipaque 300 administered via feeding tube this afternoon at 5 pm, order placed.  Tube feeding held at [facility].  Pt seen/consult note in place, consent obtained from father, ready for the procedure.   Please call IR for questions and concerns regarding G tube placement tomorrow.   Armando Gang Afifa Truax PA-C 01/15/2021 12:45 PM

## 2021-01-15 NOTE — Progress Notes (Signed)
Assisted tele visit to patient with partner.  Hillard Danker, RN

## 2021-01-15 NOTE — TOC Progression Note (Signed)
Transition of Care [facility]) - Progression Note    Patient Details  Name: Luis Hunter MRN: [id number] Date of Birth: 08-02-1975  [facility] CM/SW Contact  Pollie Friar, RN Phone Number: 01/15/2021, 12:16 PM  Clinical Narrative:    CM talked with Anderson Malta at [place] about admission. She is going to start insurance authorization for potential admission to [facility].  TOC following.   Expected Discharge Plan: [place] Barriers to Discharge: Continued Medical Work up  Expected Discharge Plan and Services Expected Discharge Plan: [place]   Discharge Planning Services: CM Consult   Living arrangements for the past 2 months: Single Family Home                                       Social Determinants of Health (SDOH) Interventions    Readmission Risk Interventions No flowsheet data found.

## 2021-01-15 NOTE — Progress Notes (Addendum)
NAME:  Luis Hunter, MRN:  [id number], DOB:  08-21-1975, LOS: 78 ADMISSION DATE:  12/16/2020, CONSULTATION DATE:  8/13 REFERRING MD:  Starleen Blue EDP [facility], CHIEF COMPLAINT:  Seizure   History of Present Illness:  45 y/o male found unresponsive after having multiple seizures.  Intubated for airway protection and loadded with Keppra.  UDS positive for cocaine and THC.  Transferred from [facility] to [facility] for continuous EEG.  Since admission he has had a LHC for an abnormal EKG, but this was felt to be due to severe LVH.  He has been treated with antibiotics for pneumonia.  He has had recurrent fevers for days.   Pertinent  Medical History  Schizophrenia, Cocaine abuse, Bipolar disorder, Depression  Significant Hospital Events: Including procedures, antibiotic start and stop dates in addition to other pertinent events   8/13 intubated, transferred to [facility] 8/20 vent dyssynchrony >> add precedex; EEG with periodic triphasic morphology and generalized slowing 8/23 remains encephalopathic, required escalation of sedatives for vent dyssynchrony  8/28 goals of care conversation: plan tracheostomy  8/29 dilantin stopped by neurology, tracheostomy 8/31 followed commands, stopped precedex but restarted overnight 9/03 changed to pressure control, start clonidine taper 9/7 Vimpat discontinued for concern of DRESS 9/8 HCTZ discontinued for concern of DRESS 9/10 tolerating pressure support wean   Tubes/lines: 8/13 ETT > 8/29 8/29 tracheostomy >   Antibiotics 8/16 started on unasyn 8/17 unasyn discontinued 8/18 started on zosyn 8/25 started augmentin for sinusitis 8/28 > 9/3 Meropenem  Cultures 8/18 respiratory culture> Enterobacter 8/18, 8/24, 9/1 respiratory culture with Serratia   Procedures/imaging: 8/22 cardiac catheterization for STEMI-no significant coronary artery disease discovered 8/24 MRI-mastoid fullness, no intracerebral abnormality 8/27 on cEEG 8/29 RUQ ultrasound > hepatic  hemangioma 8/31 CT head > NAICP 9/6 CT abd - dense right lower lobe infiltrate, small left lower lobe infiltrate  Interim History / Subjective:   No acute events.  Tolerating pressure support weans for few hours every day PEG tube planned for 9/30  Objective   Blood pressure 110/79, pulse 87, temperature 98 F (36.7 C), temperature source Oral, resp. rate 16, height '5\' 11"'$  (1.803 m), weight 69.7 kg, SpO2 100 %.    Vent Mode: PCV FiO2 (%):  [40 %] 40 % Set Rate:  [16 bmp] 16 bmp PEEP:  [5 cmH20] 5 cmH20 Pressure Support:  [10 cmH20] 10 cmH20 Plateau Pressure:  [15 cmH20-16 cmH20] 16 cmH20   Intake/Output Summary (Last 24 hours) at 01/15/2021 0839 Last data filed at 01/15/2021 0700 Gross per 24 hour  Intake 1635 ml  Output --  Net 1635 ml   Filed Weights   01/13/21 0208 01/14/21 0121 01/15/21 0321  Weight: 70.8 kg 70.5 kg 69.7 kg    Examination: Gen:      No acute distress, chronically ill appearing HEENT:  EOMI, sclera anicteric Neck:     No masses; no thyromegaly, trach Lungs:    Clear to auscultation bilaterally; normal respiratory effort CV:         Regular rate and rhythm; no murmurs Abd:      + bowel sounds; soft, non-tender; no palpable masses, no distension Ext:    No edema; adequate peripheral perfusion Skin:      Warm and dry; no rash Neuro: Awake, tracks with eyes.  Labs/imaging reviewed BUN/creatinine 32/0.54 WBC 16.7, hemoglobin 10.2, platelets 392 No new imaging  Resolved Hospital Problem list   Hypernatremia HCAP with Serratia and Enterobacter  Assessment & Plan:   Acute metabolic encephalopathy. Polysubstance  abuse with hx of depression, bipolar, and schizophrenia. Epilepsy. - on going altered mentation due to post ictal state, polysubstance abuse and underlying mental health disorder.  - clonidine taper ended 9/8 - klonopin 1 mg q8h - continue remeron, abilify - continue keppra, vimpat stopped 9/7 due to concern for DRESS  Acute hypoxemic  respiratory failure from HCAP. Serratia, Enterobacter pneumonia Presumptive diagnosis of asthma. Failure to wean s/p tracheostomy Finished antibiotics on 9/3.  He continues to have persistent cultures with Serratia and is probably colonized at this point PSV weans as tolerated. Hope to get to trach collar today Follow intermittent chest x-ray Continue Pulmicort, Brovana and Yupelri, as needed albuterol  ?DRESS Leukocytosis.  Improving Peripheral Eosinophilia - peripheral eosinophil count has trended up since admission with down trend since holding vimpat then HCTZ - Vimpat stopped 9/7 and and HCTZ stopped 9/8 - Will continue to monitor CBC with diff daily  Hypertension. - goal SBP < 140, DBP < 95 - Continue norvasc and lopressor  Hyponatremia - free water was stopped 9/7 and HCTZ stopped 9/8 Na improved to 134  Elevated LFTs from shock and hypoxia, now possible related to DRESS Liver hemangioma. - Will need outpatient repeat imaging of hepatic hemangioma - f/u LFT intermittently  Dysphagia. - IR evaluating for PEG placement, waiting for WBC count to trend down before placement  Disposition. - will likely need LTACH  Constipation - miralax BID  Best Practice (right click and "Reselect all SmartList Selections" daily)   Diet/type: tubefeeds DVT prophylaxis: LMWH GI prophylaxis: PPI Lines: N/A Foley:  N/A Code Status:  full code Last date of multidisciplinary goals of care discussion '[]'$ . Sister updated over the telephone 9/12. Sister if the main contact person. Daughter is also listed but lives in a group home.   Critical Care time:    The patient is critically ill with multiple organ system failure and requires high complexity decision making for assessment and support, frequent evaluation and titration of therapies, advanced monitoring, review of radiographic studies and interpretation of complex data.   Critical Care Time devoted to patient care services, exclusive  of separately billable procedures, described in this note is 35 minutes.   Marshell Garfinkel MD  Pulmonary & Critical care See Amion for pager  If no response to pager , please call [phone] until 7pm After 7:00 pm call Elink  [phone] 01/15/2021, 8:39 AM

## 2021-01-16 ENCOUNTER — Other Ambulatory Visit (HOSPITAL_COMMUNITY): Payer: Medicare HMO

## 2021-01-16 ENCOUNTER — Inpatient Hospital Stay (HOSPITAL_COMMUNITY): Payer: Medicare HMO

## 2021-01-16 ENCOUNTER — Institutional Professional Consult (permissible substitution)
Admit: 2021-01-16 | Discharge: 2021-03-06 | Disposition: A | Discharge status: Rehabilitation Facility | Payer: Medicare HMO | Attending: Internal Medicine | Admitting: Internal Medicine

## 2021-01-16 DIAGNOSIS — R059 Cough, unspecified: Secondary | ICD-10-CM

## 2021-01-16 DIAGNOSIS — Z95828 Presence of other vascular implants and grafts: Secondary | ICD-10-CM

## 2021-01-16 DIAGNOSIS — J9621 Acute and chronic respiratory failure with hypoxia: Secondary | ICD-10-CM

## 2021-01-16 DIAGNOSIS — Z931 Gastrostomy status: Secondary | ICD-10-CM

## 2021-01-16 DIAGNOSIS — T17908A Unspecified foreign body in respiratory tract, part unspecified causing other injury, initial encounter: Secondary | ICD-10-CM

## 2021-01-16 DIAGNOSIS — F1910 Other psychoactive substance abuse, uncomplicated: Secondary | ICD-10-CM

## 2021-01-16 DIAGNOSIS — J40 Bronchitis, not specified as acute or chronic: Secondary | ICD-10-CM

## 2021-01-16 DIAGNOSIS — K567 Ileus, unspecified: Secondary | ICD-10-CM

## 2021-01-16 DIAGNOSIS — G40901 Epilepsy, unspecified, not intractable, with status epilepticus: Secondary | ICD-10-CM

## 2021-01-16 DIAGNOSIS — J90 Pleural effusion, not elsewhere classified: Secondary | ICD-10-CM

## 2021-01-16 DIAGNOSIS — J189 Pneumonia, unspecified organism: Secondary | ICD-10-CM

## 2021-01-16 DIAGNOSIS — R0902 Hypoxemia: Secondary | ICD-10-CM

## 2021-01-16 DIAGNOSIS — G9341 Metabolic encephalopathy: Secondary | ICD-10-CM

## 2021-01-16 HISTORY — PX: IR GASTROSTOMY TUBE MOD SED: IMG625

## 2021-01-16 LAB — CBC WITH DIFFERENTIAL/PLATELET
Abs Immature Granulocytes: 0.57 10*3/uL — ABNORMAL HIGH (ref 0.00–0.07)
Basophils Absolute: 0.1 10*3/uL (ref 0.0–0.1)
Basophils Relative: 1 %
Eosinophils Absolute: 1.4 10*3/uL — ABNORMAL HIGH (ref 0.0–0.5)
Eosinophils Relative: 7 %
HCT: 31 % — ABNORMAL LOW (ref 39.0–52.0)
Hemoglobin: 10.2 g/dL — ABNORMAL LOW (ref 13.0–17.0)
Immature Granulocytes: 3 %
Lymphocytes Relative: 10 %
Lymphs Abs: 1.8 10*3/uL (ref 0.7–4.0)
MCH: 29.2 pg (ref 26.0–34.0)
MCHC: 32.9 g/dL (ref 30.0–36.0)
MCV: 88.8 fL (ref 80.0–100.0)
Monocytes Absolute: 1.2 10*3/uL — ABNORMAL HIGH (ref 0.1–1.0)
Monocytes Relative: 6 %
Neutro Abs: 13.9 10*3/uL — ABNORMAL HIGH (ref 1.7–7.7)
Neutrophils Relative %: 73 %
Platelets: 426 10*3/uL — ABNORMAL HIGH (ref 150–400)
RBC: 3.49 MIL/uL — ABNORMAL LOW (ref 4.22–5.81)
RDW: 15 % (ref 11.5–15.5)
WBC: 18.9 10*3/uL — ABNORMAL HIGH (ref 4.0–10.5)
nRBC: 0 % (ref 0.0–0.2)

## 2021-01-16 LAB — GLUCOSE, CAPILLARY
Glucose-Capillary: 88 mg/dL (ref 70–99)
Glucose-Capillary: 127 mg/dL — ABNORMAL HIGH (ref 70–99)
Glucose-Capillary: 94 mg/dL (ref 70–99)
Glucose-Capillary: 78 mg/dL (ref 70–99)

## 2021-01-16 IMAGING — DX DG ABDOMEN 1V
1 series · 1 of 1 positions shown · non-contrast
Comparison: [DATE]

CLINICAL DATA: Peg tube placement

EXAM:
ABDOMEN - 1 VIEW

[abdomen kub]
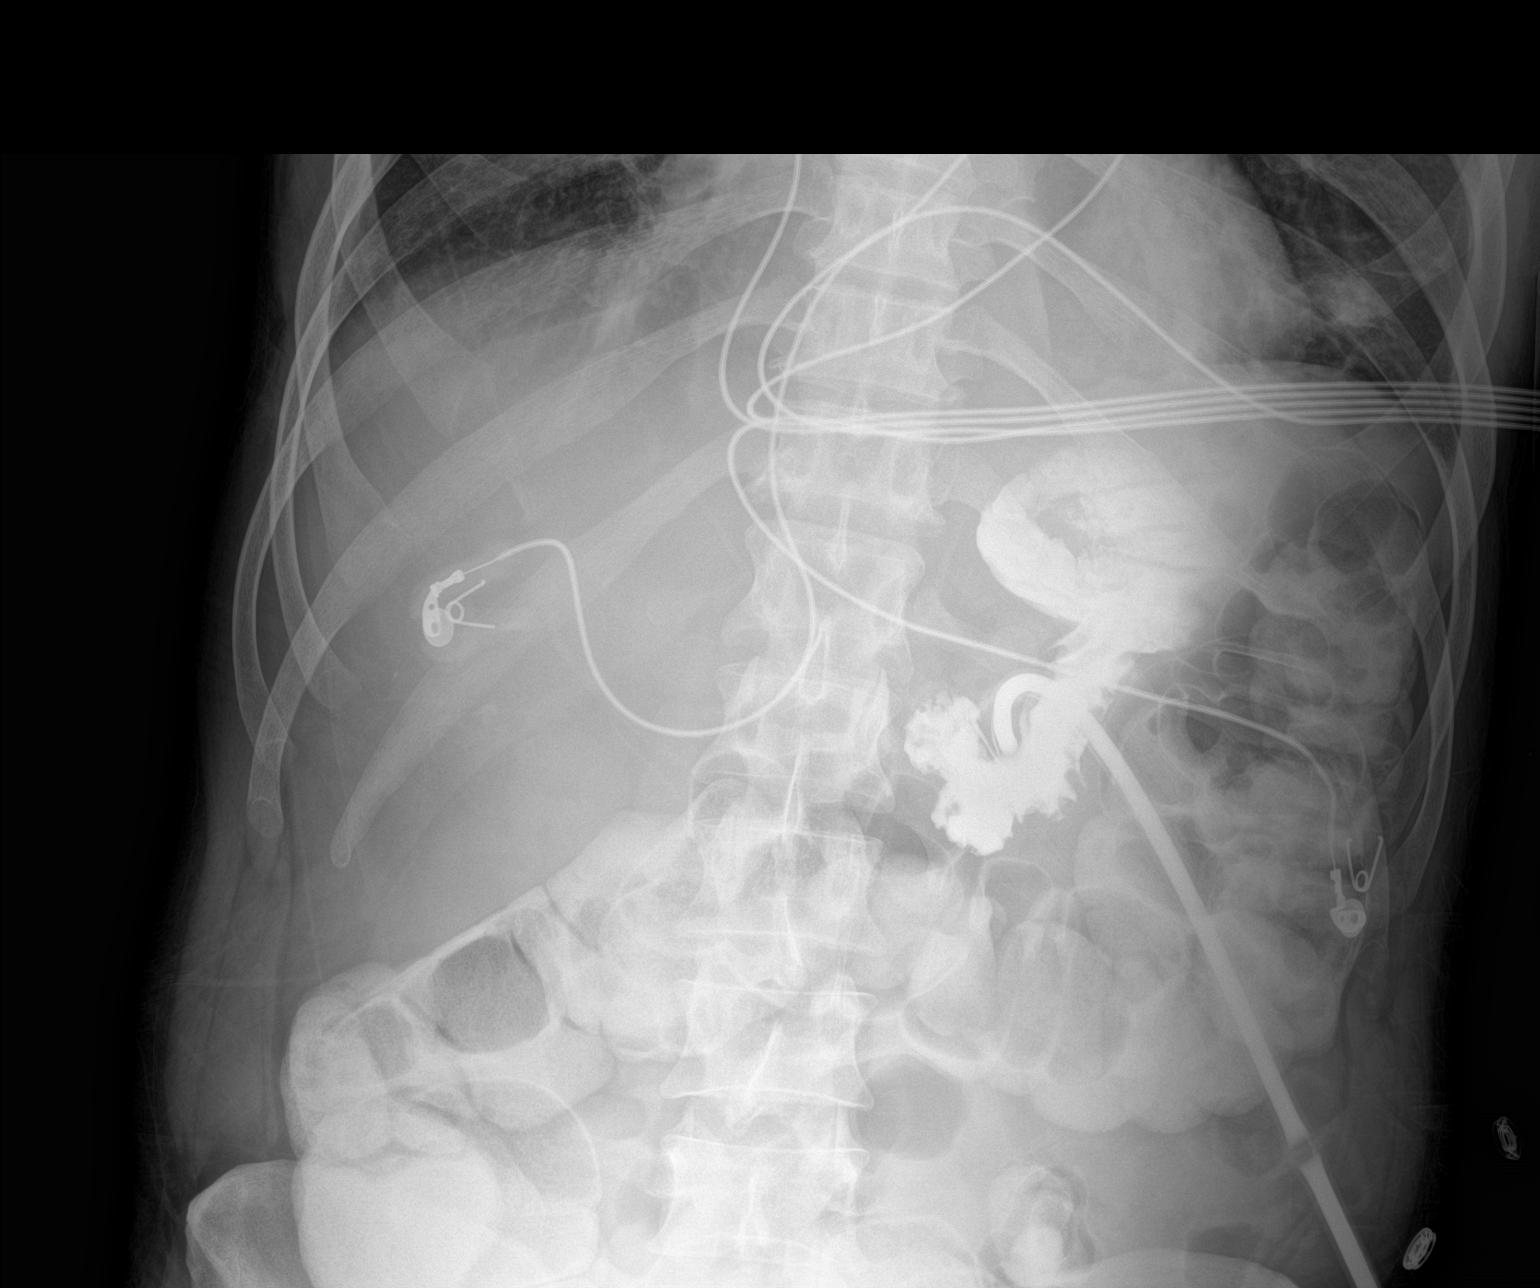

[1 of 1 positions shown; findings below may reference images not displayed]

FINDINGS: Single frontal view of the lower chest and upper abdomen
demonstrates a percutaneous gastrostomy tube within the gastric
lumen. Injected contrast outlines the gastric rugal folds. No
evidence of extravasation. Retained contrast and stool throughout
the colon. Persistent bibasilar airspace disease.
IMPRESSION: 1. Percutaneous gastrostomy tube within the stomach.
2. Persistent bibasilar airspace disease.

## 2021-01-16 IMAGING — DX DG ABD PORTABLE 1V
2 series · 2 of 2 positions shown · non-contrast
Comparison: None.

CLINICAL DATA: Evaluate Oral contrast material prior to gastrostomy
tube placement

EXAM:
PORTABLE ABDOMEN - 1 VIEW

[abdomen supine (1 of 2)]
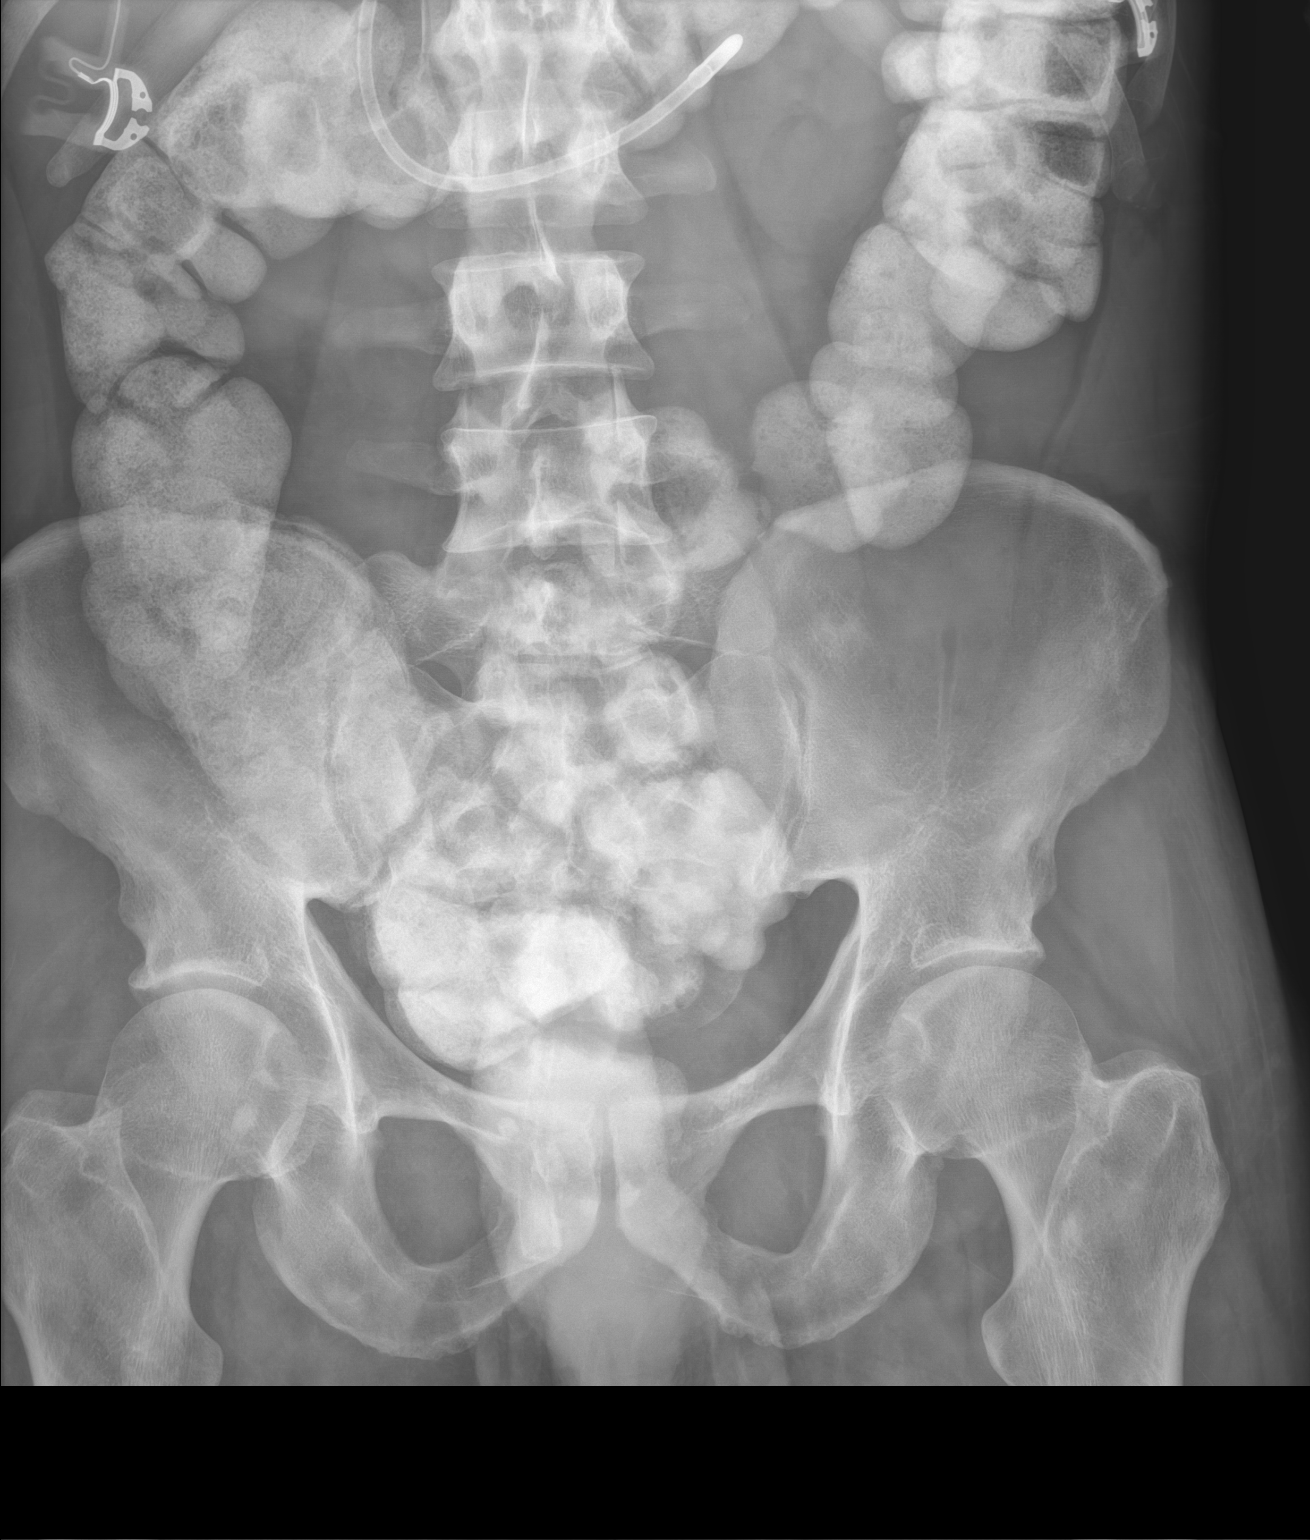

[abdomen supine (2 of 2)]
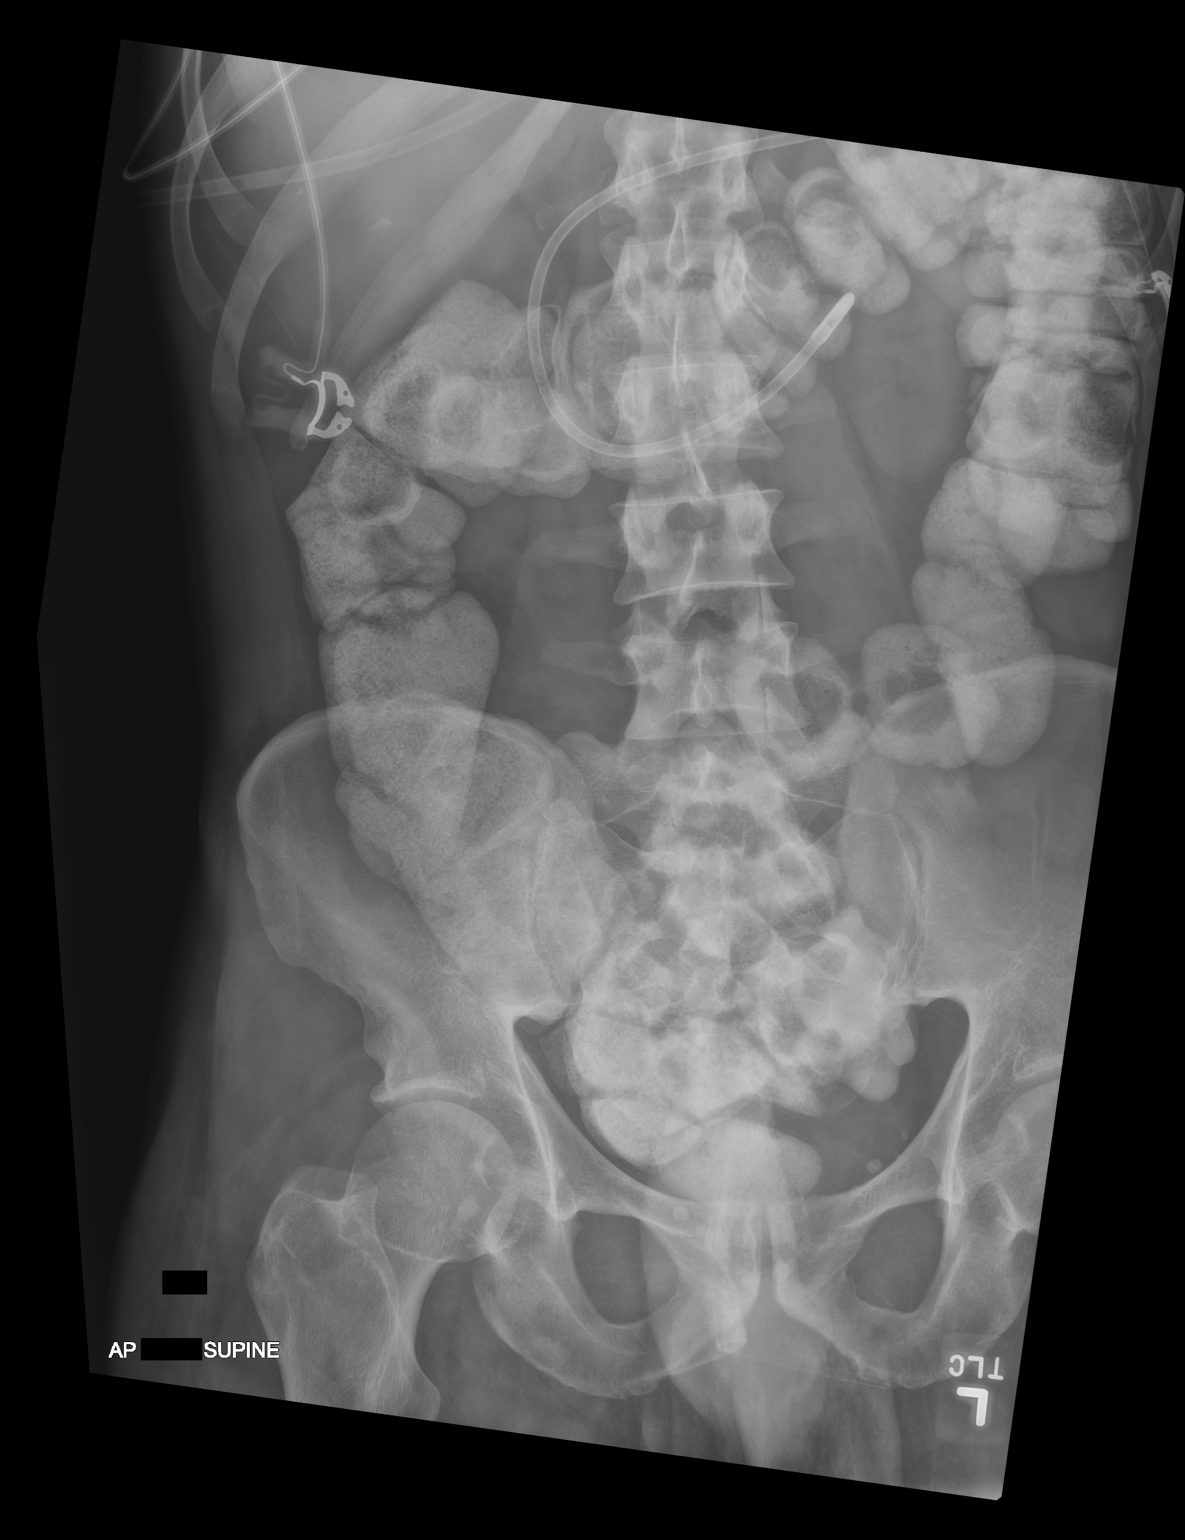

[2 of 2 positions shown; findings below may reference images not displayed]

FINDINGS: There is contrast material seen opacify large bowel. The distal
aspect of an enteric tube is seen with tip overlying the gastric
body. Normal bowel gas pattern. No acute osseous abnormality.
IMPRESSION: Oral contrast material is seen opacifying the large bowel without
acute abnormality. Enteric tube with tip overlying the gastric body.

## 2021-01-16 MED ORDER — LORAZEPAM 2 MG/ML IJ SOLN: 1.0000 mg | Freq: Four times a day (QID) | INTRAMUSCULAR | 0 refills | Status: DC | PRN

## 2021-01-16 MED ORDER — ARIPIPRAZOLE 10 MG PO TABS: 10.0000 mg | ORAL_TABLET | Freq: Every day | ORAL | Status: DC

## 2021-01-16 MED ORDER — ARIPIPRAZOLE ER 400 MG IM PRSY: 400.0000 mg | PREFILLED_SYRINGE | INTRAMUSCULAR | Status: DC

## 2021-01-16 MED ORDER — POLYVINYL ALCOHOL 1.4 % OP SOLN: 1.0000 [drp] | OPHTHALMIC | 0 refills | Status: DC | PRN

## 2021-01-16 MED ORDER — BUDESONIDE 0.25 MG/2ML IN SUSP: 0.2500 mg | Freq: Two times a day (BID) | RESPIRATORY_TRACT | 12 refills | Status: DC

## 2021-01-16 MED ORDER — GLUCAGON HCL RDNA (DIAGNOSTIC) 1 MG IJ SOLR
INTRAMUSCULAR | Status: DC | PRN
  Administered 2021-01-16: 1 mg via INTRAVENOUS

## 2021-01-16 MED ORDER — SODIUM CHLORIDE 0.9% FLUSH: 3.0000 mL | INTRAVENOUS | Status: DC | PRN

## 2021-01-16 MED ORDER — ARFORMOTEROL TARTRATE 15 MCG/2ML IN NEBU: 15.0000 ug | INHALATION_SOLUTION | Freq: Two times a day (BID) | RESPIRATORY_TRACT | Status: DC

## 2021-01-16 MED ORDER — PROSOURCE TF PO LIQD: 45.0000 mL | Freq: Three times a day (TID) | ORAL | Status: DC

## 2021-01-16 MED ORDER — OXYCODONE HCL 5 MG PO TABS: 5.0000 mg | ORAL_TABLET | ORAL | 0 refills | Status: DC | PRN

## 2021-01-16 MED ORDER — PANTOPRAZOLE SODIUM 40 MG PO PACK: 40.0000 mg | PACK | Freq: Every day | ORAL | Status: DC

## 2021-01-16 MED ORDER — SODIUM CHLORIDE 0.9% FLUSH: 10.0000 mL | INTRAVENOUS | Status: DC | PRN

## 2021-01-16 MED ORDER — MIDAZOLAM HCL 2 MG/2ML IJ SOLN
INTRAMUSCULAR | Status: AC
  Filled 2021-01-16: qty 2

## 2021-01-16 MED ORDER — ACETAMINOPHEN 160 MG/5ML PO SOLN: 650.0000 mg | ORAL | 0 refills | Status: DC | PRN

## 2021-01-16 MED ORDER — ENOXAPARIN SODIUM 40 MG/0.4ML IJ SOSY: 40.0000 mg | PREFILLED_SYRINGE | INTRAMUSCULAR | Status: DC

## 2021-01-16 MED ORDER — OXYCODONE HCL 5 MG PO TABS
5.0000 mg | ORAL_TABLET | ORAL | Status: DC | PRN
  Administered 2021-01-16: 5 mg
  Filled 2021-01-16: qty 1

## 2021-01-16 MED ORDER — MIDAZOLAM HCL 2 MG/2ML IJ SOLN
INTRAMUSCULAR | Status: DC | PRN
  Administered 2021-01-16: 1 mg via INTRAVENOUS

## 2021-01-16 MED ORDER — VITAL 1.5 CAL PO LIQD
1000.0000 mL | ORAL | Status: DC
Start: 2021-01-16 — End: 2021-04-28

## 2021-01-16 MED ORDER — BENZTROPINE MESYLATE 0.5 MG PO TABS: 0.5000 mg | ORAL_TABLET | Freq: Two times a day (BID) | ORAL | Status: DC

## 2021-01-16 MED ORDER — HYDROMORPHONE HCL 1 MG/ML IJ SOLN
0.5000 mg | Freq: Once | INTRAMUSCULAR | Status: AC
  Administered 2021-01-16: 0.5 mg via INTRAVENOUS
  Filled 2021-01-16: qty 0.5

## 2021-01-16 MED ORDER — LIDOCAINE-EPINEPHRINE 1 %-1:100000 IJ SOLN
INTRAMUSCULAR | Status: AC
  Filled 2021-01-16: qty 1

## 2021-01-16 MED ORDER — FENTANYL CITRATE (PF) 100 MCG/2ML IJ SOLN
INTRAMUSCULAR | Status: AC
  Filled 2021-01-16: qty 2

## 2021-01-16 MED ORDER — GUAIFENESIN-DM 100-10 MG/5ML PO SYRP: 5.0000 mL | ORAL_SOLUTION | ORAL | 0 refills | Status: DC | PRN

## 2021-01-16 MED ORDER — FENTANYL CITRATE (PF) 100 MCG/2ML IJ SOLN
INTRAMUSCULAR | Status: DC | PRN
  Administered 2021-01-16: 50 ug via INTRAVENOUS

## 2021-01-16 MED ORDER — METOPROLOL TARTRATE 100 MG PO TABS: 100.0000 mg | ORAL_TABLET | Freq: Two times a day (BID) | ORAL | Status: DC

## 2021-01-16 MED ORDER — POLYETHYLENE GLYCOL 3350 17 G PO PACK: 17.0000 g | PACK | Freq: Two times a day (BID) | ORAL | 0 refills | Status: DC

## 2021-01-16 MED ORDER — SODIUM CHLORIDE 0.9 % IV SOLN: 250.0000 mL | INTRAVENOUS | 0 refills | Status: DC

## 2021-01-16 MED ORDER — DIATRIZOATE MEGLUMINE & SODIUM 66-10 % PO SOLN
ORAL | Status: AC
  Filled 2021-01-16: qty 30

## 2021-01-16 MED ORDER — AMLODIPINE BESYLATE 10 MG PO TABS
10.0000 mg | ORAL_TABLET | Freq: Every day | ORAL | Status: DC
Start: 2021-01-17 — End: 2021-06-06

## 2021-01-16 MED ORDER — REVEFENACIN 175 MCG/3ML IN SOLN: 175.0000 ug | Freq: Every day | RESPIRATORY_TRACT | Status: DC

## 2021-01-16 MED ORDER — CEFAZOLIN SODIUM-DEXTROSE 2-4 GM/100ML-% IV SOLN
INTRAVENOUS | Status: DC | PRN
  Administered 2021-01-16: 2 g via INTRAVENOUS

## 2021-01-16 MED ORDER — IOHEXOL 240 MG/ML SOLN
50.0000 mL | Freq: Once | INTRAMUSCULAR | Status: AC | PRN
  Administered 2021-01-16: 20 mL via INTRAVENOUS

## 2021-01-16 MED ORDER — CLONAZEPAM 1 MG PO TBDP: 1.0000 mg | ORAL_TABLET | Freq: Three times a day (TID) | ORAL | 0 refills | Status: DC

## 2021-01-16 MED ORDER — SODIUM CHLORIDE 0.9 % IV SOLN: 250.0000 mL | INTRAVENOUS | 0 refills | Status: DC | PRN

## 2021-01-16 MED ORDER — ALBUTEROL SULFATE (2.5 MG/3ML) 0.083% IN NEBU: 2.5000 mg | INHALATION_SOLUTION | RESPIRATORY_TRACT | 12 refills | Status: DC | PRN

## 2021-01-16 MED ORDER — SODIUM CHLORIDE 0.9% FLUSH: 3.0000 mL | Freq: Two times a day (BID) | INTRAVENOUS | Status: DC

## 2021-01-16 MED ORDER — GLUCAGON HCL RDNA (DIAGNOSTIC) 1 MG IJ SOLR
INTRAMUSCULAR | Status: AC
  Filled 2021-01-16: qty 1

## 2021-01-16 MED ORDER — LIDOCAINE HCL (PF) 1 % IJ SOLN
INTRAMUSCULAR | Status: DC | PRN
  Administered 2021-01-16: 15 mL via SUBCUTANEOUS

## 2021-01-16 MED ORDER — INFLUENZA VAC SPLIT QUAD 0.5 ML IM SUSY: 0.5000 mL | PREFILLED_SYRINGE | Freq: Once | INTRAMUSCULAR | 0 refills | Status: AC

## 2021-01-16 MED ORDER — LEVETIRACETAM 100 MG/ML PO SOLN: 1500.0000 mg | Freq: Two times a day (BID) | ORAL | 12 refills | Status: DC

## 2021-01-16 MED ORDER — MIRTAZAPINE 15 MG PO TABS: 15.0000 mg | ORAL_TABLET | Freq: Every day | ORAL | Status: DC

## 2021-01-16 NOTE — Procedures (Signed)
Pre procedure Dx: Dysphagia Post Procedure Dx: Same  Successful fluoroscopic guided insertion of gastrostomy tube.   The gastrostomy tube may be used immediately for medications.   Tube feeds may be initiated in 24 hours as per the primary team.    EBL: Minimal  Complications: None immediate  Jay Reta Norgren, MD Pager #: [phone]    

## 2021-01-16 NOTE — Progress Notes (Signed)
Called report to [facility] and report given to Eston Esters, RN. [facility] is unable to take patient at this time.

## 2021-01-16 NOTE — Progress Notes (Signed)
NAME:  Luis Hunter, MRN:  [id number], DOB:  08/03/1975, LOS: 59 ADMISSION DATE:  12/16/2020, CONSULTATION DATE:  12/16/2020 REFERRING MD:  Starleen Blue - EDP [facility] CHIEF COMPLAINT:  Seizure   History of Present Illness:  45 year old man found unresponsive after having multiple seizures. Intubated for airway protection and loaded with Keppra.  UDS positive for cocaine and THC.  Transferred from [facility] to [facility] for continuous EEG.  Since admission, patient has had a LHC for an abnormal EKG, but this was felt to be due to severe LVH.  He has been treated with antibiotics for pneumonia and has had recurrent fevers.  Pertinent Medical History:  Schizophrenia, Cocaine abuse, Bipolar disorder, Depression  Significant Hospital Events: Including procedures, antibiotic start and stop dates in addition to other pertinent events   8/13 intubated, transferred to [facility] 8/20 vent dyssynchrony >> add precedex; EEG with periodic triphasic morphology and generalized slowing 8/23 remains encephalopathic, required escalation of sedatives for vent dyssynchrony  8/28 goals of care conversation: plan tracheostomy  8/29 dilantin stopped by neurology, tracheostomy 8/31 followed commands, stopped precedex but restarted overnight 9/03 changed to pressure control, start clonidine taper 9/7 Vimpat discontinued for concern of DRESS 9/8 HCTZ discontinued for concern of DRESS 9/10 tolerating pressure support wean 9/13 Alert, tolerating PSV. Significant secretions per RRT. PEG today with IR.  Tubes/Lines: 8/13 ETT >> 8/29 8/29 Tracheostomy >>  Antibiotics: 8/16 Started on unasyn 8/17 Unasyn discontinued 8/18 Started on zosyn 8/25 Started augmentin for sinusitis 8/28 >> 9/3 Meropenem  Cultures: 8/18 Respiratory culture >> Enterobacter 8/18, 8/24, 9/1 Respiratory culture >> Serratia  Procedures/Imaging: 8/22 cardiac catheterization for STEMI-no significant coronary artery disease discovered 8/24 MRI-mastoid fullness, no  intracerebral abnormality 8/27 on cEEG 8/29 RUQ ultrasound > hepatic hemangioma 8/31 CT head > NAICP 9/6 CT abd - dense right lower lobe infiltrate, small left lower lobe infiltrate  Interim History/Subjective:  No significant events overnight Weaned on PSV most of the day yesterday PRVC overnight, back to PSV this AM Significant secretions per RRT Appears anxious this AM PEG today with IR  Objective:  Blood pressure (!) 122/94, pulse 92, temperature 97.8 F (36.6 C), temperature source Oral, resp. rate (!) 27, height '5\' 11"'$  (1.803 m), weight 69.3 kg, SpO2 99 %.    Vent Mode: PCV FiO2 (%):  [35 %-40 %] 40 % Set Rate:  [16 bmp] 16 bmp PEEP:  [5 cmH20] 5 cmH20 Plateau Pressure:  [12 cmH20-16 cmH20] 14 cmH20   Intake/Output Summary (Last 24 hours) at 01/16/2021 1000 Last data filed at 01/16/2021 0200 Gross per 24 hour  Intake 975 ml  Output 850 ml  Net 125 ml    Filed Weights   01/14/21 0121 01/15/21 0321 01/16/21 0500  Weight: 70.5 kg 69.7 kg 69.3 kg   Physical Examination: General: Chronically ill-appearing middle-aged man in NAD, appears anxious. HEENT: [place]/AT, anicteric sclera, PERRL, slightly dry mucous membranes. Neuro: Awake, oriented x 4. Responds to verbal stimuli. Following commands consistently. Moves all 4 extremities spontaneously. CV: RRR, no m/g/r. PULM: Breathing even and unlabored on vent via trach (PSV 10/5, FiO2 40%). Lung fields diminished at bases, R > L. GI: Soft, nontender, nondistended. Normoactive bowel sounds. Extremities: No LE edema noted. Skin: Warm/dry, no rashes.  Resolved Hospital Problem List:   Hypernatremia HCAP with Serratia and Enterobacter  Assessment & Plan:   Acute metabolic encephalopathy Polysubstance abuse with hx of depression, bipolar, and schizophrenia Epilepsy Ongoing altered mentation, likely multifactorial in the setting of post-ictal state, polysubstance  abuse and underlying mental health disorder. - S/p clonidine  taper (end 9/8) - Continue Keppra; Vimpat discontinued 9/7 in the setting of ?DRESS - Continue klonopin - Continue mirtazapine, Abilify  Acute hypoxemic respiratory failure from HCAP. Serratia, Enterobacter pneumonia Presumptive diagnosis of asthma Failure to wean s/p tracheostomy Finished antibiotics on 9/3.  He continues to have persistent cultures with Serratia and is probably colonized at this point. - Wean vent, PSV as tolerated - PRVC at night/when fatiguing - Goal trach collar - Wean FiO2 for O2 sat > 90% - Daily WUA/SBT - VAP bundle - Pulmonary hygiene - Continue Pulmicort, Brovana, Yupelri, Albuterol PRN - Intermittent CXR  ?DRESS Leukocytosis.  Improving Peripheral Eosinophilia Peripheral eosinophil count has trended up since admission with downtrend since holding vimpat then HCTZ. Vimpat stopped 9/7 and and HCTZ stopped 9/8. - Trend CBC with diff daily  Hypertension. Goal SBP < 140, DBP < 95 - Continue Norvasc, Lopressor  Hyponatremia Free water was stopped 9/7 and HCTZ stopped 9/8. - Trend Na - Na normalized to 134 9/12  Elevated LFTs from shock and hypoxia, now possible related to DRESS Liver hemangioma. - Intermittent LFTs - Will require outpatient imaging/monitoring of hepatic hemangioma  Dysphagia - Plan for PEG with IR today, 9/13 - Nutrition consulted, appreciate recommendations  Disposition - Likely will require LTACH, TOC team following  Constipation - Bowel regimen - Miralax BID  Best Practice (right click and "Reselect all SmartList Selections" daily)   Diet/type: tubefeeds - PEG 9/13 DVT prophylaxis: LMWH GI prophylaxis: PPI Lines: N/A Foley:  N/A Code Status:  full code Last date of multidisciplinary goals of care discussion [9/12]. Sister updated over the telephone 9/12. Sister if the main contact person. Daughter is also listed but lives in a group home.   Critical Care time: 35 minutes   Lestine Mount, [place]  Pulmonary & Critical Care 01/16/21 10:01 AM  Please see [email or web address] for pager details.  From 7A-7P if no response, please call [phone] After hours, please call ELink [phone]

## 2021-01-16 NOTE — Progress Notes (Signed)
[facility] at change of shift to see if mattress had been delivered. Charge nurse says mattress is delivered and it is ok to transfer patient at this time. Writer made sure that night nurse received report from day shift and has been informed that night nurse already has report.

## 2021-01-16 NOTE — TOC Progression Note (Signed)
Transition of Care [facility]) - Progression Note    Patient Details  Name: Luis Hunter MRN: [id number] Date of Birth: August 15, 1975  Transition of Care [facility]) CM/SW Contact  Sharin Mons, RN Phone Number: 01/16/2021, 2:17 PM  Clinical Narrative:    Biochemist, clinical received for [employer], admission's liaison Anderson Malta [phone]) made NCM aware. NCM made made MD, nurse and sister Arriane  aware of approval.  MD states is pt medically ready for transition to LTAC today, [facility] made aware. Per Anderson Malta once d/c summary completed pt can d[facility]c to [facility]. Receiving MD will be Dr. Rosilyn Mings. Pt will go into Rm# [id number]...nurse to call report to receiving nurse [phone] ) 45 min prior to transfer.    Expected Discharge Plan: Long Term Acute Care (LTAC) Barriers to Discharge: Continued Medical Work up  Expected Discharge Plan and Services Expected Discharge Plan: [place] (LTAC)   Discharge Planning Services: CM Consult   Living arrangements for the past 2 months: Single Family Home                                       Social Determinants of Health (SDOH) Interventions    Readmission Risk Interventions No flowsheet data found.

## 2021-01-16 NOTE — Progress Notes (Signed)
   01/15/21 2000  Oxygen Therapy  SpO2 100 %  O2 Device Tracheostomy Collar  O2 Flow Rate (L/min) 8 L/min  FiO2 (%) 35 %  Patient Activity (if Appropriate) In bed  Pulse Oximetry Type Continuous  Oximetry Probe Site Changed Yes    [place], [place]

## 2021-01-16 NOTE — Discharge Summary (Signed)
Physician Discharge Summary       Patient ID: Luis Hunter MRN: [id number] DOB/AGE: 1975-07-12 45 y.o.  Admission Date: 12/16/2020 Discharge Date: 01/16/2021  Discharge Diagnoses:  Active Problems:   Status epilepticus ([place])   ST elevation myocardial infarction (STEMI) (HCC)   Acute respiratory failure with hypercapnia (HCC)   Encephalopathy acute   Pressure injury of skin  History of Present Illness: 45 year old man found unresponsive 8/13 after having multiple seizures. Intubated for airway protection and loaded with Keppra.  UDS positive for cocaine and THC.  [facility] to [facility] 8/13 for continuous EEG.  Since admission, patient has had a LHC for an abnormal EKG, but this was felt to be due to severe LVH.  He has been treated with antibiotics for pneumonia and has had recurrent fevers.  Hospital Course:  Luis Hunter is a 45 year old man who was found unresponsive after having multiple seizures. He presented to [facility], intubated for airway protection in the setting of altered mental status and loaded with Keppra.  UDS on admission was positive for cocaine and THC. Given concern for status epilepticus, patient was transferred from [facility] to [facility] for continuous EEG.  On 8/22, patient had an abnormal EKG c/f ACS. LHC was completed and negative with EKG abnormality attributed to severe LVH. Patient was unable to be weaned from ventilator and underwent tracheostomy creation 8/29. Post-tracheostomy, he was able to tolerate PSV and eventually trach collar. During admission, there was concern for DRESS with increase in eosinophils on CBC and Vimpat/HCTZ were stopped with subsequent improvement in eosinophilia. He has been treated with antibiotics for pneumonia.  On 9/13, patient underwent uneventful PEG tube placement with IR, tolerated well. Post-procedure, patient was weaned to trach collar (8L) with O2 sats 98-99%. He was deemed medically ready for transfer to [facility]  Discharge Plan by Active Problems:  Acute metabolic encephalopathy Polysubstance abuse with hx of depression, bipolar, and schizophrenia Epilepsy Ongoing altered mentation, likely multifactorial in the setting of post-ictal state, polysubstance abuse and underlying mental health disorder. - S/p clonidine taper (end 9/8) - Continue Keppra; Vimpat discontinued 9/7 in the setting of ?DRESS - Continue klonopin - Continue mirtazapine, Abilify   Acute hypoxemic respiratory failure from HCAP Serratia, Enterobacter pneumonia Presumptive diagnosis of asthma Failure to wean s/p tracheostomy Finished antibiotics on 9/3.  He continues to have persistent cultures with Serratia and is probably colonized at this point. - Tolerating trach collar - Wean FiO2 for O2 sat > 90% - Pulmonary hygiene - Continue Pulmicort, Brovana, Yupelri, Albuterol PRN - Intermittent CXR   ?DRESS Leukocytosis, improving Peripheral Eosinophilia, improving Peripheral eosinophil count has trended up since admission with downtrend since holding vimpat then HCTZ. Vimpat stopped 9/7 and and HCTZ stopped 9/8. - Trend CBC with diff daily   Hypertension. Goal SBP < 140, DBP < 95 - Continue Norvasc, Lopressor   Hyponatremia Free water was stopped 9/7 and HCTZ stopped 9/8. - Trend Na - Na normalized to 134 9/12   Elevated LFTs from shock and hypoxia, now possible related to DRESS Liver hemangioma - Intermittent LFTs - Will require outpatient imaging/monitoring of hepatic hemangioma   Dysphagia - S/p PEG with IR 9/13 - Nutrition consulted, appreciate recommendations   Disposition - [facility] for ongoing care 9/13   Constipation - Bowel regimen - Miralax BID  Significant Hospital Tests/Studies:  Consults: IR, Neurology  Discharge Exam: BP 119/70   Pulse 90   Temp 98.3 F (36.8 C) (Oral)  Resp 16   Ht '5\' 11"'$  (1.803 m)   Wt 69.3 kg   SpO2 97%   BMI 21.31 kg/m   Physical  Examination: General: Chronically ill-appearing middle-aged man in NAD. Resting comfortably in bed. HEENT: [place]/AT, anicteric sclera, PERRL, slightly dry mucous membranes. Poor dentition. Clear-light yellow secretions around tracheostomy. Trach collar in place. Neuro: Awake, oriented x 4. Responds to verbal stimuli. Following commands consistently. Moves all 4 extremities spontaneously. CV: RRR, no m/g/r. PULM: Breathing even and unlabored on trach collar (8L 35% FiO2). Lung fields diminished at bases, R > L. GI: Soft, nontender, nondistended. Normoactive bowel sounds. PEG in place to RUQ, dressing c/d/I. Extremities: No LE edema noted. Skin: Warm/dry, no rashes.  Labs at Discharge: Lab Results  Component Value Date   CREATININE 0.54 (L) 01/15/2021   BUN 32 (H) 01/15/2021   NA 137 01/15/2021   K 4.1 01/15/2021   CL 102 01/15/2021   CO2 25 01/15/2021   Lab Results  Component Value Date   WBC 18.9 (H) 01/16/2021   HGB 10.2 (L) 01/16/2021   HCT 31.0 (L) 01/16/2021   MCV 88.8 01/16/2021   PLT 426 (H) 01/16/2021   Lab Results  Component Value Date   ALT 232 (H) 01/15/2021   AST 93 (H) 01/15/2021   ALKPHOS 201 (H) 01/15/2021   BILITOT 0.7 01/15/2021   Lab Results  Component Value Date   INR 1.0 11/19/2019   Current Radiology Studies: IR GASTROSTOMY TUBE MOD SED  Result Date: 01/16/2021 INDICATION: Dysphagia. Please perform percutaneous gastrostomy tube placement for enteric nutrition supplementation purposes. EXAM: PULL TROUGH GASTROSTOMY TUBE PLACEMENT COMPARISON:  CT abdomen and pelvis-01/09/2021 MEDICATIONS: Ancef 2 gm IV; Antibiotics were administered within 1 hour of the procedure. Glucagon 1 mg IV CONTRAST:  20 mL of Omnipaque 240 administered into the gastric lumen. ANESTHESIA/SEDATION: Moderate (conscious) sedation was employed during this procedure. A total of Versed 1 mg and Fentanyl 50 mcg was administered intravenously. Moderate Sedation Time: 10 minutes. The patient's  level of consciousness and vital signs were monitored continuously by radiology nursing throughout the procedure under my direct supervision. FLUOROSCOPY TIME:  2 minutes, 42 seconds (50 mGy) COMPLICATIONS: None immediate. PROCEDURE: Informed written consent was obtained from the patient's family following explanation of the procedure, risks, benefits and alternatives. A time out was performed prior to the initiation of the procedure. Ultrasound scanning was performed to demarcate the edge of the left lobe of the liver. Maximal barrier sterile technique utilized including caps, mask, sterile gowns, sterile gloves, large sterile drape, hand hygiene and Betadine prep. The left upper quadrant was sterilely prepped and draped. An oral gastric catheter was inserted into the stomach under fluoroscopy. The existing nasogastric feeding tube was removed. The left costal margin and barium/air opacified transverse colon were identified and avoided. Air was injected into the stomach for insufflation and visualization under fluoroscopy. Under sterile conditions a 17 gauge trocar needle was utilized to access the stomach percutaneously beneath the left subcostal margin after the overlying soft tissues were anesthetized with 1% Lidocaine with epinephrine. Needle position was confirmed within the stomach with aspiration of air and injection of small amount of contrast. A single T tack was deployed for gastropexy. Over an Amplatz guide wire, a 9-French sheath was inserted into the stomach. A snare device was utilized to capture the oral gastric catheter. The snare device was pulled retrograde from the stomach up the esophagus and out the oropharynx. The 20-French pull-through gastrostomy was connected to the snare  device and pulled antegrade through the oropharynx down the esophagus into the stomach and then through the percutaneous tract external to the patient. The gastrostomy was assembled externally. Contrast injection confirms  appropriate positioning within the stomach. Several spot radiographic images were obtained in various obliquities for documentation. Dressings were applied. The patient tolerated procedure well without immediate post procedural complication. FINDINGS: After successful fluoroscopic guided placement, the gastrostomy tube is appropriately positioned with internal disc positioned against the inner ventral wall of the gastric lumen. IMPRESSION: Successful fluoroscopic insertion of a 20-French pull-through gastrostomy tube. The gastrostomy may be used immediately for medication administration and in 24 hrs for the initiation of feeds. Electronically Signed   By: Sandi Mariscal M.D.   On: 01/16/2021 11:00   DG Abd Portable 1V  Result Date: 01/16/2021 CLINICAL DATA:  Evaluate Oral contrast material prior to gastrostomy tube placement EXAM: PORTABLE ABDOMEN - 1 VIEW COMPARISON:  None. FINDINGS: There is contrast material seen opacify large bowel. The distal aspect of an enteric tube is seen with tip overlying the gastric body. Normal bowel gas pattern. No acute osseous abnormality. IMPRESSION: Oral contrast material is seen opacifying the large bowel without acute abnormality. Enteric tube with tip overlying the gastric body. Electronically Signed   By: Albin Felling M.D.   On: 01/16/2021 08:02    Disposition: [facility]  Discharge disposition: 63-Long Term Care     Allergies as of 01/16/2021   No Known Allergies      Medication List     STOP taking these medications    levETIRAcetam 750 MG tablet Commonly known as: KEPPRA Replaced by: levETIRAcetam 100 MG/ML solution       TAKE these medications    acetaminophen 160 MG/5ML solution Commonly known as: TYLENOL Place 20.3 mLs (650 mg total) into feeding tube every 4 (four) hours as needed for mild pain (temp > 101.5).   albuterol (2.5 MG/3ML) 0.083% nebulizer solution Commonly known as: PROVENTIL Take 3 mLs (2.5 mg total) by  nebulization every 4 (four) hours as needed for wheezing or shortness of breath.   amLODipine 10 MG tablet Commonly known as: NORVASC Place 1 tablet (10 mg total) into feeding tube daily. Start taking on: January 17, 2021   arformoterol 15 MCG/2ML Nebu Commonly known as: BROVANA Take 2 mLs (15 mcg total) by nebulization 2 (two) times daily.   ARIPiprazole ER 400 MG Prsy prefilled syringe Commonly known as: ABILIFY MAINTENA Inject 400 mg into the muscle every 30 (thirty) days. Resume after discharge from hospital. Do not take concurrently with PO/VT Abilify. What changed:  how to take this additional instructions   ARIPiprazole 10 MG tablet Commonly known as: ABILIFY Place 1 tablet (10 mg total) into feeding tube daily. Start taking on: January 17, 2021 What changed: You were already taking a medication with the same name, and this prescription was added. Make sure you understand how and when to take each.   benztropine 0.5 MG tablet Commonly known as: COGENTIN Take 1 tablet (0.5 mg total) by mouth 2 (two) times daily. Resume on discharge from hospital. What changed: additional instructions   budesonide 0.25 MG/2ML nebulizer solution Commonly known as: PULMICORT Take 2 mLs (0.25 mg total) by nebulization 2 (two) times daily.   clonazePAM 1 MG disintegrating tablet Commonly known as: KLONOPIN Place 1 tablet (1 mg total) into feeding tube every 8 (eight) hours.   enoxaparin 40 MG/0.4ML injection Commonly known as: LOVENOX Inject 0.4 mLs (40 mg total) into the  skin daily. Start taking on: January 17, 2021   feeding supplement (VITAL 1.5 CAL) Liqd Place 1,000 mLs into feeding tube continuous.   feeding supplement (PROSource TF) liquid Place 45 mLs into feeding tube 3 (three) times daily.   guaiFENesin-dextromethorphan 100-10 MG/5ML syrup Commonly known as: ROBITUSSIN DM Place 5 mLs into feeding tube every 4 (four) hours as needed for cough.   influenza vac split  quadrivalent PF 0.5 ML injection Commonly known as: FLUARIX Inject 0.5 mLs into the muscle once for 1 dose.   levETIRAcetam 100 MG/ML solution Commonly known as: KEPPRA Place 15 mLs (1,500 mg total) into feeding tube every 12 (twelve) hours. Replaces: levETIRAcetam 750 MG tablet   LORazepam 2 MG/ML injection Commonly known as: ATIVAN Inject 0.5 mLs (1 mg total) into the vein every 6 (six) hours as needed for anxiety.   metoprolol tartrate 100 MG tablet Commonly known as: LOPRESSOR Place 1 tablet (100 mg total) into feeding tube 2 (two) times daily.   mirtazapine 15 MG tablet Commonly known as: REMERON Place 1 tablet (15 mg total) into feeding tube at bedtime.   oxyCODONE 5 MG immediate release tablet Commonly known as: Oxy IR/ROXICODONE Place 1 tablet (5 mg total) into feeding tube every 4 (four) hours as needed for moderate pain or severe pain.   pantoprazole sodium 40 mg Pack Commonly known as: PROTONIX Place 20 mLs (40 mg total) into feeding tube daily. Start taking on: January 17, 2021   polyethylene glycol 17 g packet Commonly known as: MIRALAX / GLYCOLAX Place 17 g into feeding tube 2 (two) times daily.   polyvinyl alcohol 1.4 % ophthalmic solution Commonly known as: LIQUIFILM TEARS Place 1 drop into both eyes as needed for dry eyes.   revefenacin 175 MCG/3ML nebulizer solution Commonly known as: YUPELRI Take 3 mLs (175 mcg total) by nebulization daily. Start taking on: January 17, 2021   sodium chloride 0.9 % infusion Inject 250 mLs into the vein continuous.   sodium chloride 0.9 % infusion Inject 250 mLs into the vein as needed (for IV line care  (Saline / Heparin Lock)).   sodium chloride flush 0.9 % Soln Commonly known as: NS 10-40 mLs by Intracatheter route as needed (flush).   sodium chloride flush 0.9 % Soln Commonly known as: NS Inject 3 mLs into the vein every 12 (twelve) hours.   sodium chloride flush 0.9 % Soln Commonly known as:  NS Inject 3 mLs into the vein as needed.        Discharged Condition: stable  45 minutes of time have been dedicated to discharge assessment, planning and discharge instructions.   Signed:  Rhae Lerner [place] [facility] 01/16/21 2:52 PM  Please see [email or web address] for pager details.  From 7A-7P if no response, please call [phone] After hours, please call ELink [phone]

## 2021-01-16 NOTE — Progress Notes (Signed)
IR notified that there was no consent documentation in the chart for his PEG procedure.

## 2021-01-16 NOTE — Progress Notes (Signed)
   01/16/21 1107  Oxygen Therapy  SpO2 97 %  O2 Device Tracheostomy Collar  O2 Flow Rate (L/min) 8 L/min  FiO2 (%) 35 %    [place], [place] [phone]

## 2021-01-16 NOTE — Progress Notes (Signed)
Patient was transported to IR & back to room 2M09 on the ventilator with no problems.

## 2021-01-16 NOTE — Progress Notes (Signed)
[facility] to [facility] on 6L venturi trach collar.

## 2021-01-16 NOTE — Progress Notes (Signed)
Assisted tele visit to patient with partner.  Hillard Danker, RN

## 2021-01-17 DIAGNOSIS — J9621 Acute and chronic respiratory failure with hypoxia: Secondary | ICD-10-CM

## 2021-01-17 DIAGNOSIS — G40901 Epilepsy, unspecified, not intractable, with status epilepticus: Secondary | ICD-10-CM

## 2021-01-17 DIAGNOSIS — F1910 Other psychoactive substance abuse, uncomplicated: Secondary | ICD-10-CM

## 2021-01-17 DIAGNOSIS — J189 Pneumonia, unspecified organism: Secondary | ICD-10-CM

## 2021-01-17 DIAGNOSIS — G9341 Metabolic encephalopathy: Secondary | ICD-10-CM

## 2021-01-17 LAB — BASIC METABOLIC PANEL
Anion gap: 10 (ref 5–15)
BUN: 32 mg/dL — ABNORMAL HIGH (ref 6–20)
CO2: 28 mmol/L (ref 22–32)
Calcium: 9.5 mg/dL (ref 8.9–10.3)
Chloride: 99 mmol/L (ref 98–111)
Creatinine, Ser: 0.56 mg/dL — ABNORMAL LOW (ref 0.61–1.24)
GFR, Estimated: >60 mL/min (ref >60)
Glucose, Bld: 118 mg/dL — ABNORMAL HIGH (ref 70–99)
Potassium: 4.1 mmol/L (ref 3.5–5.1)
Sodium: 137 mmol/L (ref 135–145)

## 2021-01-17 LAB — CBC
HCT: 31.5 % — ABNORMAL LOW (ref 39.0–52.0)
Hemoglobin: 10 g/dL — ABNORMAL LOW (ref 13.0–17.0)
MCH: 28.5 pg (ref 26.0–34.0)
MCHC: 31.7 g/dL (ref 30.0–36.0)
MCV: 89.7 fL (ref 80.0–100.0)
Platelets: 418 10*3/uL — ABNORMAL HIGH (ref 150–400)
RBC: 3.51 MIL/uL — ABNORMAL LOW (ref 4.22–5.81)
RDW: 15.1 % (ref 11.5–15.5)
WBC: 25.6 10*3/uL — ABNORMAL HIGH (ref 4.0–10.5)
nRBC: 0 % (ref 0.0–0.2)

## 2021-01-17 NOTE — Consult Note (Signed)
Pulmonary [place]  Date of Service: 01/17/2021  PULMONARY CRITICAL CARE CONSULT   Luis Hunter  [id number]  DOB: December 09, 1975   DOA: 01/16/2021  Referring Physician: Satira Sark, MD  HPI: Luis Hunter is a 45 y.o. male seen for follow up of Acute on Chronic Respiratory Failure.  Patient has multiple medical problems including coronary artery disease myocardial infarction encephalopathy status epilepticus came into the hospital because of having multiple seizures.  Apparently patient had been intubated and started on Keppra and was found to have a UDS positive for cocaine as well as THC patient was transferred to [facility] for further management and continuous EEG monitoring.  Patient subsequently was not able to be weaned and ended up having to have a tracheostomy as well as a PEG placed and then was able to start weaning on T collar.  Patient is significantly encephalopathic on presentation  Review of Systems:  ROS performed and is unremarkable other than noted above.  Past Medical History:  Diagnosis Date   Bipolar disorder ([place])    Depression    Schizophrenia ([place])    Seizures ([place])     Past Surgical History:  Procedure Laterality Date   FINGER SURGERY     IR GASTROSTOMY TUBE MOD SED  01/16/2021   LEFT HEART CATH AND CORONARY ANGIOGRAPHY N/A 12/25/2020   Procedure: LEFT HEART CATH AND CORONARY ANGIOGRAPHY;  Surgeon: Nelva Bush, MD;  Location: [facility];  Service: Cardiovascular;  Laterality: N/A;    Social History:    reports that he has been smoking. He has never used smokeless tobacco. He reports current drug use. Drugs: Marijuana and Cocaine. He reports that he does not drink alcohol.  Family History: Non-Contributory to the present illness  No Known Allergies  Medications: Reviewed on Rounds  Physical Exam:  Vitals: Temperature is 99.8 pulse 90 respiratory rate is 18  blood pressure is 107/61 saturations 98%  Ventilator Settings on T collar  General: Comfortable at this time Eyes: Grossly normal lids, irises & conjunctiva ENT: grossly tongue is normal Neck: no obvious mass Cardiovascular: S1-S2 no is normal no gallop Respiratory: No rhonchi no rales Abdomen: Soft nontender Skin: no rash seen on limited exam Musculoskeletal: not rigid Psychiatric:unable to assess Neurologic: no seizure no involuntary movements         Labs on Admission:  Basic Metabolic Panel: Recent Labs  Lab 01/11/21 0705 01/12/21 0933 01/14/21 0226 01/15/21 0313 01/17/21 0327  NA 132* 134* 139 137 137  K 3.8 3.8 4.6 4.1 4.1  CL 97* 98 101 102 99  CO2 '26 27 26 25 28  '$ GLUCOSE 144* 137* 111* 126* 118*  BUN 31* 28* 33* 32* 32*  CREATININE 0.52* 0.54* 0.46* 0.54* 0.56*  CALCIUM 9.0 8.8* 9.1 9.0 9.5  MG  --  1.9 2.1 2.0  --   PHOS  --  4.1 4.5 4.4  --     No results for input(s): PHART, PCO2ART, PO2ART, HCO3, O2SAT in the last 168 hours.  Liver Function Tests: Recent Labs  Lab 01/15/21 0313  AST 93*  ALT 232*  ALKPHOS 201*  BILITOT 0.7  PROT 7.7  ALBUMIN 2.3*   No results for input(s): LIPASE, AMYLASE in the last 168 hours. No results for input(s): AMMONIA in the last 168 hours.  CBC: Recent Labs  Lab 01/12/21 0300 01/13/21 0142 01/14/21 0226 01/15/21 0313 01/16/21 0159 01/17/21 0327  WBC 23.8* 19.8* 18.0*  16.7* 18.9* 25.6*  NEUTROABS 18.2* 15.1* 12.9* 11.6* 13.9*  --   HGB 10.3* 10.5* 10.7* 10.2* 10.2* 10.0*  HCT 31.9* 32.1* 32.5* 31.5* 31.0* 31.5*  MCV 88.4 88.4 89.5 90.0 88.8 89.7  PLT 400 400 418* 392 426* 418*    Cardiac Enzymes: No results for input(s): CKTOTAL, CKMB, CKMBINDEX, TROPONINI in the last 168 hours.  BNP (last 3 results) No results for input(s): BNP in the last 8760 hours.  ProBNP (last 3 results) No results for input(s): PROBNP in the last 8760 hours.   Radiological Exams on Admission: IR GASTROSTOMY TUBE MOD  SED  Result Date: 01/16/2021 INDICATION: Dysphagia. Please perform percutaneous gastrostomy tube placement for enteric nutrition supplementation purposes. EXAM: PULL TROUGH GASTROSTOMY TUBE PLACEMENT COMPARISON:  CT abdomen and pelvis-01/09/2021 MEDICATIONS: Ancef 2 gm IV; Antibiotics were administered within 1 hour of the procedure. Glucagon 1 mg IV CONTRAST:  20 mL of Omnipaque 240 administered into the gastric lumen. ANESTHESIA/SEDATION: Moderate (conscious) sedation was employed during this procedure. A total of Versed 1 mg and Fentanyl 50 mcg was administered intravenously. Moderate Sedation Time: 10 minutes. The patient's level of consciousness and vital signs were monitored continuously by radiology nursing throughout the procedure under my direct supervision. FLUOROSCOPY TIME:  2 minutes, 42 seconds (50 mGy) COMPLICATIONS: None immediate. PROCEDURE: Informed written consent was obtained from the patient's family following explanation of the procedure, risks, benefits and alternatives. A time out was performed prior to the initiation of the procedure. Ultrasound scanning was performed to demarcate the edge of the left lobe of the liver. Maximal barrier sterile technique utilized including caps, mask, sterile gowns, sterile gloves, large sterile drape, hand hygiene and Betadine prep. The left upper quadrant was sterilely prepped and draped. An oral gastric catheter was inserted into the stomach under fluoroscopy. The existing nasogastric feeding tube was removed. The left costal margin and barium/air opacified transverse colon were identified and avoided. Air was injected into the stomach for insufflation and visualization under fluoroscopy. Under sterile conditions a 17 gauge trocar needle was utilized to access the stomach percutaneously beneath the left subcostal margin after the overlying soft tissues were anesthetized with 1% Lidocaine with epinephrine. Needle position was confirmed within the stomach  with aspiration of air and injection of small amount of contrast. A single T tack was deployed for gastropexy. Over an Amplatz guide wire, a 9-French sheath was inserted into the stomach. A snare device was utilized to capture the oral gastric catheter. The snare device was pulled retrograde from the stomach up the esophagus and out the oropharynx. The 20-French pull-through gastrostomy was connected to the snare device and pulled antegrade through the oropharynx down the esophagus into the stomach and then through the percutaneous tract external to the patient. The gastrostomy was assembled externally. Contrast injection confirms appropriate positioning within the stomach. Several spot radiographic images were obtained in various obliquities for documentation. Dressings were applied. The patient tolerated procedure well without immediate post procedural complication. FINDINGS: After successful fluoroscopic guided placement, the gastrostomy tube is appropriately positioned with internal disc positioned against the inner ventral wall of the gastric lumen. IMPRESSION: Successful fluoroscopic insertion of a 20-French pull-through gastrostomy tube. The gastrostomy may be used immediately for medication administration and in 24 hrs for the initiation of feeds. Electronically Signed   By: Sandi Mariscal M.D.   On: 01/16/2021 11:00   DG ABDOMEN PEG TUBE LOCATION  Result Date: 01/16/2021 CLINICAL DATA:  Peg tube placement EXAM: ABDOMEN - 1 VIEW COMPARISON:  01/16/2021 FINDINGS: Single frontal view of the lower chest and upper abdomen demonstrates a percutaneous gastrostomy tube within the gastric lumen. Injected contrast outlines the gastric rugal folds. No evidence of extravasation. Retained contrast and stool throughout the colon. Persistent bibasilar airspace disease. IMPRESSION: 1. Percutaneous gastrostomy tube within the stomach. 2. Persistent bibasilar airspace disease. Electronically Signed   By: Randa Ngo M.D.    On: 01/16/2021 22:21   DG Abd Portable 1V  Result Date: 01/16/2021 CLINICAL DATA:  Evaluate Oral contrast material prior to gastrostomy tube placement EXAM: PORTABLE ABDOMEN - 1 VIEW COMPARISON:  None. FINDINGS: There is contrast material seen opacify large bowel. The distal aspect of an enteric tube is seen with tip overlying the gastric body. Normal bowel gas pattern. No acute osseous abnormality. IMPRESSION: Oral contrast material is seen opacifying the large bowel without acute abnormality. Enteric tube with tip overlying the gastric body. Electronically Signed   By: Albin Felling M.D.   On: 01/16/2021 08:02    Assessment/Plan Active Problems:   Acute on chronic respiratory failure with hypoxia (HCC)   Status epilepticus (HCC)   Polysubstance abuse (HCC)   Healthcare-associated pneumonia   Acute metabolic encephalopathy   Acute on chronic respiratory failure with hypoxia plan is going to be to continue with the T collar needed as an assessment of secretions.  We will continue with pulmonary toilet Status epilepticus right now there is no active seizures but we will need to continue to monitor closely Polysubstance abuse patient has a history of polysubstance abuse we will continue to follow for any withdrawal activity and seizures. Acute encephalopathy basically no change multifactorial again Healthcare associated pneumonia patient had been found to have Serratia pneumonia treated with antibiotics we will continue to follow-up with serial x-rays  I have personally seen and evaluated the patient, evaluated laboratory and imaging results, formulated the assessment and plan and placed orders. The Patient requires high complexity decision making with multiple systems involvement.  Case was discussed on Rounds with the Respiratory Therapy Director and the Respiratory staff Time Spent 79mnutes  Kymberlie Brazeau A Azreal Stthomas, MD FPlastic Surgery Center Of St Joseph IncPulmonary Critical Care Medicine Sleep Medicine

## 2021-01-18 DIAGNOSIS — F1910 Other psychoactive substance abuse, uncomplicated: Secondary | ICD-10-CM

## 2021-01-18 DIAGNOSIS — G9341 Metabolic encephalopathy: Secondary | ICD-10-CM

## 2021-01-18 DIAGNOSIS — J9621 Acute and chronic respiratory failure with hypoxia: Secondary | ICD-10-CM

## 2021-01-18 DIAGNOSIS — G40901 Epilepsy, unspecified, not intractable, with status epilepticus: Secondary | ICD-10-CM

## 2021-01-18 DIAGNOSIS — J189 Pneumonia, unspecified organism: Secondary | ICD-10-CM

## 2021-01-18 NOTE — Progress Notes (Signed)
Pulmonary [place]  PROGRESS NOTE     Luis Hunter  [id number]  DOB: 1975/08/04   DOA: 01/16/2021  Referring Physician: Satira Sark, MD  HPI: Luis Hunter is a 45 y.o. male being followed for ventilator/airway/oxygen weaning Acute on Chronic Respiratory Failure.  At this time patient is on T collar has been on 35% FiO2 still remains confused and agitated at times  Medications: Reviewed on Rounds  Physical Exam:  Vitals: Temperature is 97.2 pulse 94 respiratory is 28 blood pressure is 147/85 saturations 97%  Ventilator Settings off the ventilator currently on T collar with an FiO2 of 35%  General: Comfortable at this time Neck: supple Cardiovascular: no malignant arrhythmias Respiratory: No rhonchi very coarse breath sounds Skin: no rash seen on limited exam Musculoskeletal: No gross abnormality Psychiatric:unable to assess Neurologic:no involuntary movements         Lab Data:   Basic Metabolic Panel: Recent Labs  Lab 01/12/21 0933 01/14/21 0226 01/15/21 0313 01/17/21 0327  NA 134* 139 137 137  K 3.8 4.6 4.1 4.1  CL 98 101 102 99  CO2 '27 26 25 28  '$ GLUCOSE 137* 111* 126* 118*  BUN 28* 33* 32* 32*  CREATININE 0.54* 0.46* 0.54* 0.56*  CALCIUM 8.8* 9.1 9.0 9.5  MG 1.9 2.1 2.0  --   PHOS 4.1 4.5 4.4  --     ABG: No results for input(s): PHART, PCO2ART, PO2ART, HCO3, O2SAT in the last 168 hours.  Liver Function Tests: Recent Labs  Lab 01/15/21 0313  AST 93*  ALT 232*  ALKPHOS 201*  BILITOT 0.7  PROT 7.7  ALBUMIN 2.3*   No results for input(s): LIPASE, AMYLASE in the last 168 hours. No results for input(s): AMMONIA in the last 168 hours.  CBC: Recent Labs  Lab 01/12/21 0300 01/13/21 0142 01/14/21 0226 01/15/21 0313 01/16/21 0159 01/17/21 0327  WBC 23.8* 19.8* 18.0* 16.7* 18.9* 25.6*  NEUTROABS 18.2* 15.1* 12.9* 11.6* 13.9*  --   HGB 10.3* 10.5* 10.7*  10.2* 10.2* 10.0*  HCT 31.9* 32.1* 32.5* 31.5* 31.0* 31.5*  MCV 88.4 88.4 89.5 90.0 88.8 89.7  PLT 400 400 418* 392 426* 418*    Cardiac Enzymes: No results for input(s): CKTOTAL, CKMB, CKMBINDEX, TROPONINI in the last 168 hours.  BNP (last 3 results) No results for input(s): BNP in the last 8760 hours.  ProBNP (last 3 results) No results for input(s): PROBNP in the last 8760 hours.  Radiological Exams: DG ABDOMEN PEG TUBE LOCATION  Result Date: 01/16/2021 CLINICAL DATA:  Peg tube placement EXAM: ABDOMEN - 1 VIEW COMPARISON:  01/16/2021 FINDINGS: Single frontal view of the lower chest and upper abdomen demonstrates a percutaneous gastrostomy tube within the gastric lumen. Injected contrast outlines the gastric rugal folds. No evidence of extravasation. Retained contrast and stool throughout the colon. Persistent bibasilar airspace disease. IMPRESSION: 1. Percutaneous gastrostomy tube within the stomach. 2. Persistent bibasilar airspace disease. Electronically Signed   By: Randa Ngo M.D.   On: 01/16/2021 22:21    Assessment/Plan Active Problems:   Acute on chronic respiratory failure with hypoxia (HCC)   Status epilepticus (HCC)   Polysubstance abuse (HCC)   Healthcare-associated pneumonia   Acute metabolic encephalopathy   Acute on chronic respiratory failure with hypoxia plan is going to be to continue with T collar trials patient right now is on 35% FiO2 plan is going to be to continue to monitor the confusion  and agitation however is limiting [place] as far as being able to advance the weaning Status epilepticus no sign of active seizures right now we will continue to monitor closely. Polysubstance abuse no change we will continue to follow along closely Healthcare associated pneumonia treated we will continue with supportive care Acute metabolic encephalopathy no change   I have personally seen and evaluated the patient, evaluated laboratory and imaging results, formulated the  assessment and plan and placed orders. The Patient requires high complexity decision making with multiple systems involvement.  Rounds were done with the Respiratory Therapy Director and Staff therapists and discussed with nursing staff also.  Allyne Gee, MD [facility] Pulmonary Critical Care Medicine Sleep Medicine

## 2021-01-20 DIAGNOSIS — G9341 Metabolic encephalopathy: Secondary | ICD-10-CM | POA: Diagnosis not present

## 2021-01-20 DIAGNOSIS — F1910 Other psychoactive substance abuse, uncomplicated: Secondary | ICD-10-CM | POA: Diagnosis not present

## 2021-01-20 DIAGNOSIS — J189 Pneumonia, unspecified organism: Secondary | ICD-10-CM | POA: Diagnosis not present

## 2021-01-20 DIAGNOSIS — J9621 Acute and chronic respiratory failure with hypoxia: Secondary | ICD-10-CM | POA: Diagnosis not present

## 2021-01-20 NOTE — Progress Notes (Signed)
Pulmonary [place]  PROGRESS NOTE     Luis Hunter  [id number]  DOB: [birth year]   DOA: 01/16/2021  Referring Physician: Satira Sark, MD  HPI: Luis Hunter is a 45 y.o. male being followed for ventilator/airway/oxygen weaning Acute on Chronic Respiratory Failure.  Comfortable right now without distress patient is afebrile remains on T collar  Medications: Reviewed on Rounds  Physical Exam:  Vitals: Temperature is 97.3 pulse 85 respiratory rate is 20 blood pressure is 160/64 saturations 97%  Ventilator Settings off the vent on T collar  General: Comfortable at this time Neck: supple Cardiovascular: no malignant arrhythmias Respiratory: Very coarse breath sounds with increased upper airway secretions Skin: no rash seen on limited exam Musculoskeletal: No gross abnormality Psychiatric:unable to assess Neurologic:no involuntary movements         Lab Data:   Basic Metabolic Panel: Recent Labs  Lab 01/14/21 0226 01/15/21 0313 01/17/21 0327  NA 139 137 137  K 4.6 4.1 4.1  CL 101 102 99  CO2 '26 25 28  '$ GLUCOSE 111* 126* 118*  BUN 33* 32* 32*  CREATININE 0.46* 0.54* 0.56*  CALCIUM 9.1 9.0 9.5  MG 2.1 2.0  --   PHOS 4.5 4.4  --     ABG: No results for input(s): PHART, PCO2ART, PO2ART, HCO3, O2SAT in the last 168 hours.  Liver Function Tests: Recent Labs  Lab 01/15/21 0313  AST 93*  ALT 232*  ALKPHOS 201*  BILITOT 0.7  PROT 7.7  ALBUMIN 2.3*   No results for input(s): LIPASE, AMYLASE in the last 168 hours. No results for input(s): AMMONIA in the last 168 hours.  CBC: Recent Labs  Lab 01/14/21 0226 01/15/21 0313 01/16/21 0159 01/17/21 0327  WBC 18.0* 16.7* 18.9* 25.6*  NEUTROABS 12.9* 11.6* 13.9*  --   HGB 10.7* 10.2* 10.2* 10.0*  HCT 32.5* 31.5* 31.0* 31.5*  MCV 89.5 90.0 88.8 89.7  PLT 418* 392 426* 418*    Cardiac Enzymes: No results for input(s):  CKTOTAL, CKMB, CKMBINDEX, TROPONINI in the last 168 hours.  BNP (last 3 results) No results for input(s): BNP in the last 8760 hours.  ProBNP (last 3 results) No results for input(s): PROBNP in the last 8760 hours.  Radiological Exams: No results found.  Assessment/Plan Active Problems:   Acute on chronic respiratory failure with hypoxia (HCC)   Status epilepticus (HCC)   Polysubstance abuse (HCC)   Healthcare-associated pneumonia   Acute metabolic encephalopathy   Acute on chronic respiratory failure with hypoxia plan is to continue with T collar trials on 35% FiO2 Status epilepticus no active seizures noted Polysubstance abuse mental baseline Healthcare associated pneumonia treated patient has residual cursive secretion in necessary. Acute metabolic encephalopathy no change   I have personally seen and evaluated the patient, evaluated laboratory and imaging results, formulated the assessment and plan and placed orders. The Patient requires high complexity decision making with multiple systems involvement.  Rounds were done with the Respiratory Therapy Director and Staff therapists and discussed with nursing staff also.  Allyne Gee, MD [facility] Pulmonary Critical Care Medicine Sleep Medicine

## 2021-01-22 DIAGNOSIS — J189 Pneumonia, unspecified organism: Secondary | ICD-10-CM | POA: Diagnosis not present

## 2021-01-22 DIAGNOSIS — F1910 Other psychoactive substance abuse, uncomplicated: Secondary | ICD-10-CM | POA: Diagnosis not present

## 2021-01-22 DIAGNOSIS — G9341 Metabolic encephalopathy: Secondary | ICD-10-CM | POA: Diagnosis not present

## 2021-01-22 DIAGNOSIS — J9621 Acute and chronic respiratory failure with hypoxia: Secondary | ICD-10-CM | POA: Diagnosis not present

## 2021-01-22 LAB — CULTURE, RESPIRATORY W GRAM STAIN

## 2021-01-22 NOTE — Progress Notes (Signed)
Pulmonary [place]  PROGRESS NOTE     Luis Hunter  LKT:[id number]  DOB: 1976/04/22   DOA: 01/16/2021  Referring Physician: Satira Sark, MD  HPI: Luis Hunter is a 45 y.o. male being followed for ventilator/airway/oxygen weaning Acute on Chronic Respiratory Failure.  Patient remains on the T collar secretions are quite copious.  Medications: Reviewed on Rounds  Physical Exam:  Vitals: Temperature is 97.3 pulse 90 respiratory rate is 25 saturations 98%  Ventilator Settings currently off the ventilator has been on T collar 28%  General: Comfortable at this time Neck: supple Cardiovascular: no malignant arrhythmias Respiratory: Scattered rhonchi expansion is equal Skin: no rash seen on limited exam Musculoskeletal: No gross abnormality Psychiatric:unable to assess Neurologic:no involuntary movements         Lab Data:   Basic Metabolic Panel: Recent Labs  Lab 01/17/21 0327  NA 137  K 4.1  CL 99  CO2 28  GLUCOSE 118*  BUN 32*  CREATININE 0.56*  CALCIUM 9.5    ABG: No results for input(s): PHART, PCO2ART, PO2ART, HCO3, O2SAT in the last 168 hours.  Liver Function Tests: No results for input(s): AST, ALT, ALKPHOS, BILITOT, PROT, ALBUMIN in the last 168 hours. No results for input(s): LIPASE, AMYLASE in the last 168 hours. No results for input(s): AMMONIA in the last 168 hours.  CBC: Recent Labs  Lab 01/16/21 0159 01/17/21 0327  WBC 18.9* 25.6*  NEUTROABS 13.9*  --   HGB 10.2* 10.0*  HCT 31.0* 31.5*  MCV 88.8 89.7  PLT 426* 418*    Cardiac Enzymes: No results for input(s): CKTOTAL, CKMB, CKMBINDEX, TROPONINI in the last 168 hours.  BNP (last 3 results) No results for input(s): BNP in the last 8760 hours.  ProBNP (last 3 results) No results for input(s): PROBNP in the last 8760 hours.  Radiological Exams: No results found.  Assessment/Plan Active Problems:    Acute on chronic respiratory failure with hypoxia (HCC)   Status epilepticus (HCC)   Polysubstance abuse (HCC)   Healthcare-associated pneumonia   Acute metabolic encephalopathy   Acute on chronic respiratory failure with hypoxia patient continues to be on full support with the T collar.  Secretions are still very copious and therefore not able to be decannulated Status epilepticus no change we will continue with supportive care Polysubstance abuse no change no withdrawal Healthcare associated pneumonia has been treated we will continue to monitor along closely Acute metabolic encephalopathy slow to improve   I have personally seen and evaluated the patient, evaluated laboratory and imaging results, formulated the assessment and plan and placed orders. The Patient requires high complexity decision making with multiple systems involvement.  Rounds were done with the Respiratory Therapy Director and Staff therapists and discussed with nursing staff also.  Allyne Gee, MD [facility] Pulmonary Critical Care Medicine Sleep Medicine

## 2021-01-23 ENCOUNTER — Other Ambulatory Visit (HOSPITAL_COMMUNITY): Payer: Medicare HMO

## 2021-01-23 DIAGNOSIS — J9621 Acute and chronic respiratory failure with hypoxia: Secondary | ICD-10-CM | POA: Diagnosis not present

## 2021-01-23 DIAGNOSIS — F1910 Other psychoactive substance abuse, uncomplicated: Secondary | ICD-10-CM | POA: Diagnosis not present

## 2021-01-23 DIAGNOSIS — G9341 Metabolic encephalopathy: Secondary | ICD-10-CM | POA: Diagnosis not present

## 2021-01-23 DIAGNOSIS — J189 Pneumonia, unspecified organism: Secondary | ICD-10-CM | POA: Diagnosis not present

## 2021-01-23 LAB — CBC
HCT: 34.3 % — ABNORMAL LOW (ref 39.0–52.0)
Hemoglobin: 10.8 g/dL — ABNORMAL LOW (ref 13.0–17.0)
MCH: 28.6 pg (ref 26.0–34.0)
MCHC: 31.5 g/dL (ref 30.0–36.0)
MCV: 91 fL (ref 80.0–100.0)
Platelets: 264 10*3/uL (ref 150–400)
RBC: 3.77 MIL/uL — ABNORMAL LOW (ref 4.22–5.81)
RDW: 15.6 % — ABNORMAL HIGH (ref 11.5–15.5)
WBC: 11.2 10*3/uL — ABNORMAL HIGH (ref 4.0–10.5)
nRBC: 0 % (ref 0.0–0.2)

## 2021-01-23 LAB — BASIC METABOLIC PANEL
Anion gap: 14 (ref 5–15)
BUN: 20 mg/dL (ref 6–20)
CO2: 31 mmol/L (ref 22–32)
Calcium: 9.6 mg/dL (ref 8.9–10.3)
Chloride: 93 mmol/L — ABNORMAL LOW (ref 98–111)
Creatinine, Ser: 0.52 mg/dL — ABNORMAL LOW (ref 0.61–1.24)
GFR, Estimated: >60 mL/min (ref >60)
Glucose, Bld: 94 mg/dL (ref 70–99)
Potassium: 3.9 mmol/L (ref 3.5–5.1)
Sodium: 138 mmol/L (ref 135–145)

## 2021-01-23 IMAGING — CT CT HEAD W/O CM
4 series · 17 of 47 positions shown, 19 images · non-contrast
Comparison: [DATE]

CLINICAL DATA: Head trauma.  Fall.

EXAM:
CT HEAD WITHOUT CONTRAST
TECHNIQUE: Contiguous axial images were obtained from the base of the skull
through the vertex without intravenous contrast.

[Series 3: head without · axial · non-contrast · 0.52mm/px · z∈[-221,-86]mm · 7 of 37 slices shown, 9 images]
[im 5/37  brain]
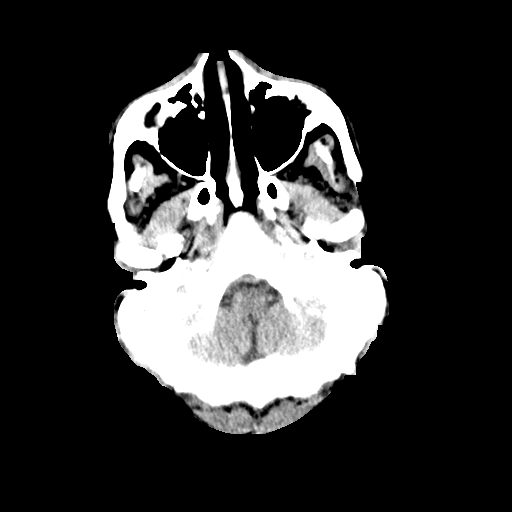
[im 5/37  bone]
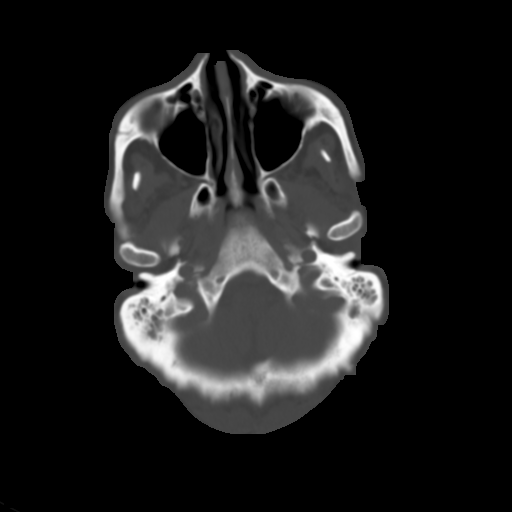
[im 10/37  brain]
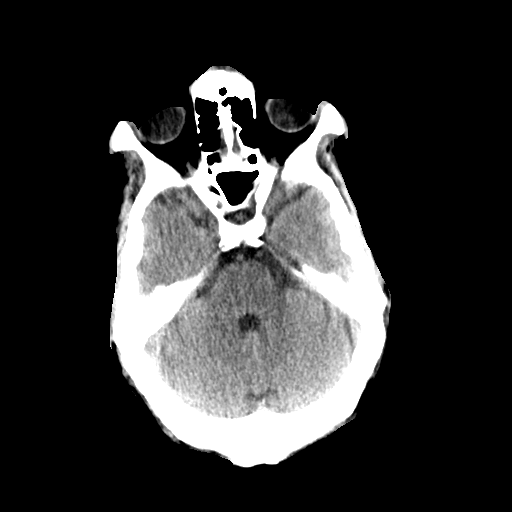
[im 14/37  brain]
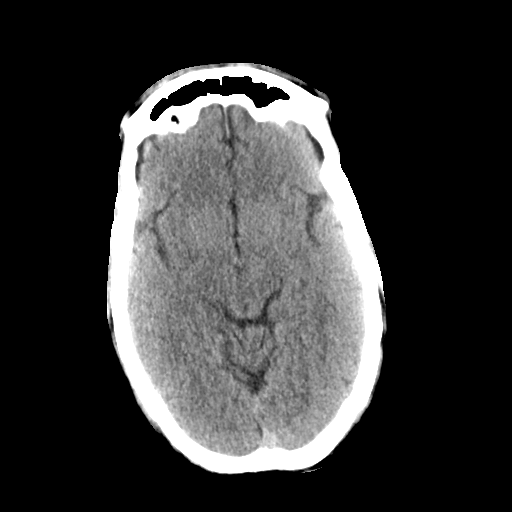
[im 19/37  brain]
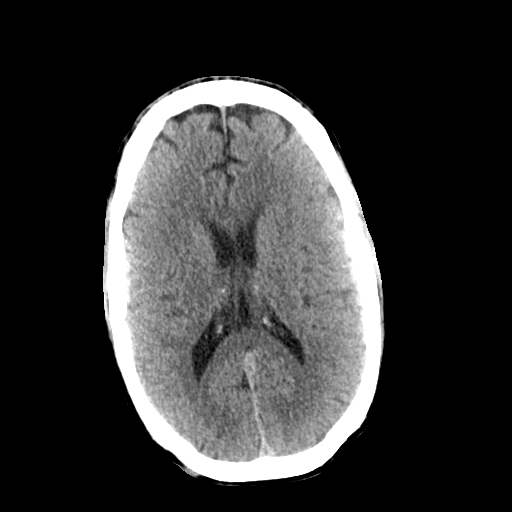
[im 23/37  brain]
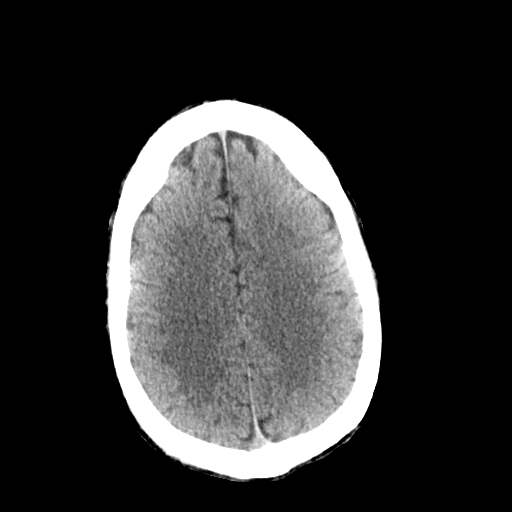
[im 23/37  bone]
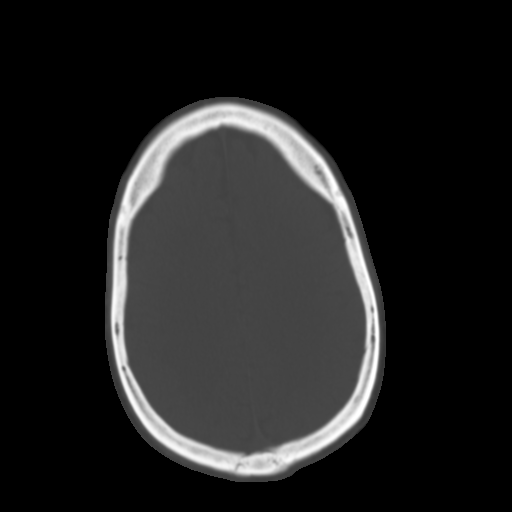
[im 28/37  brain]
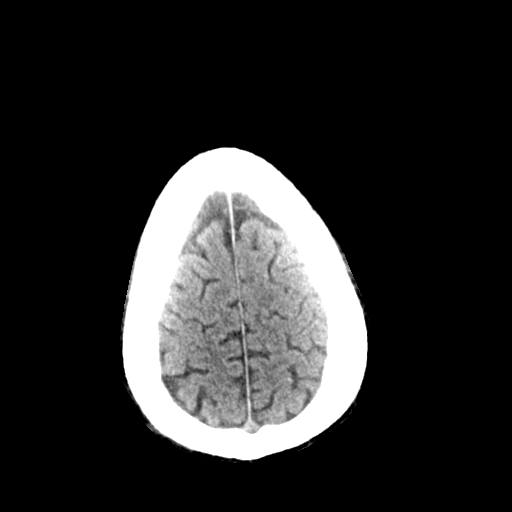
[im 32/37  brain]
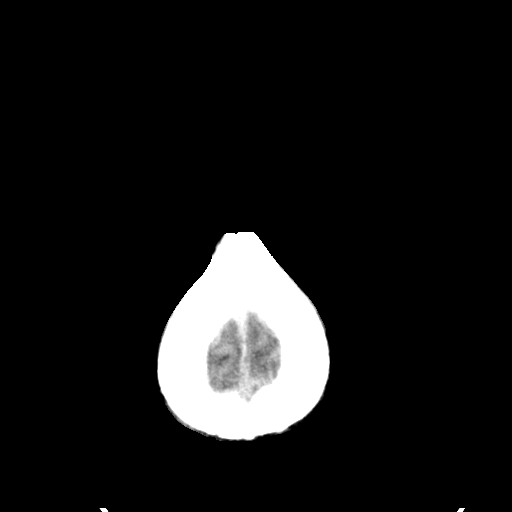

[Series 4: head bone · axial · 0.52mm/px · z∈[-223,-161]mm · 4 of 91 slices shown]
[im 10/91  bone]
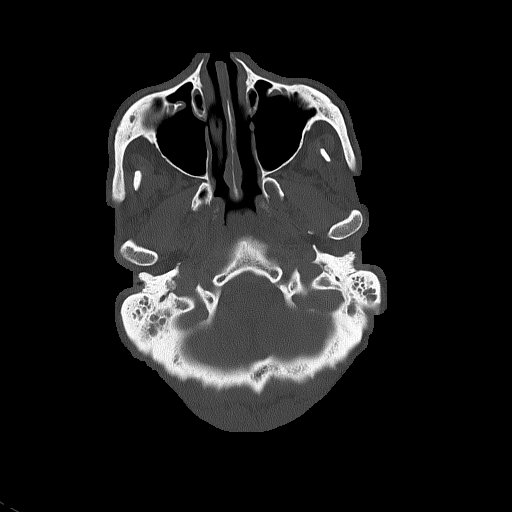
[im 19/91  bone]
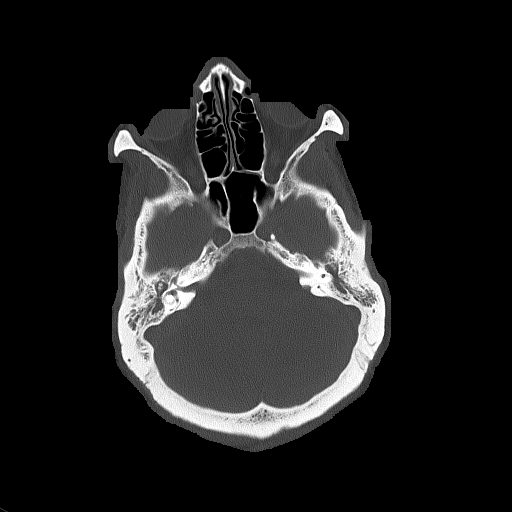
[im 28/91  bone]
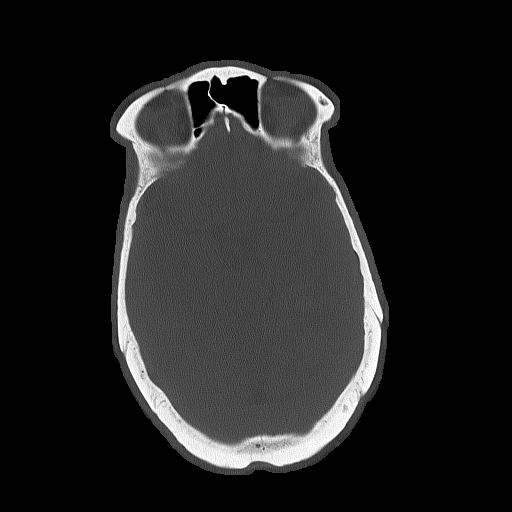
[im 41/91  bone]
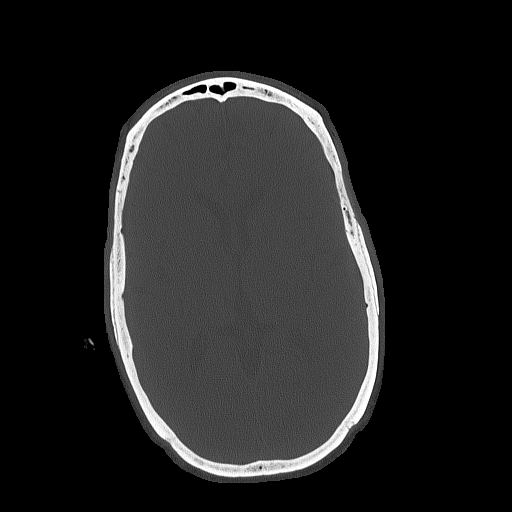

[Series 5: head without cor · coronal · non-contrast · 0.35mm/px · 3 of 79 slices shown]
[im 27/79  brain]
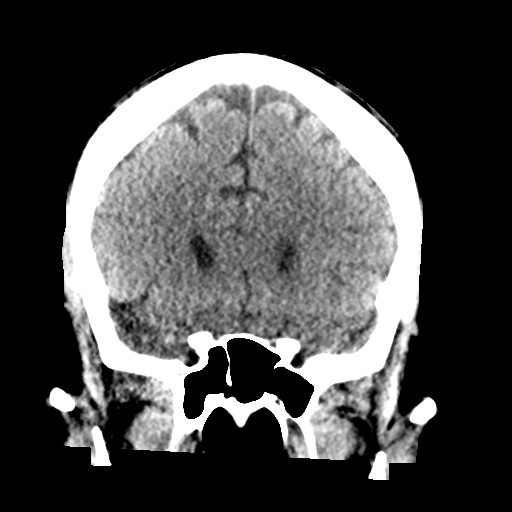
[im 35/79  brain]
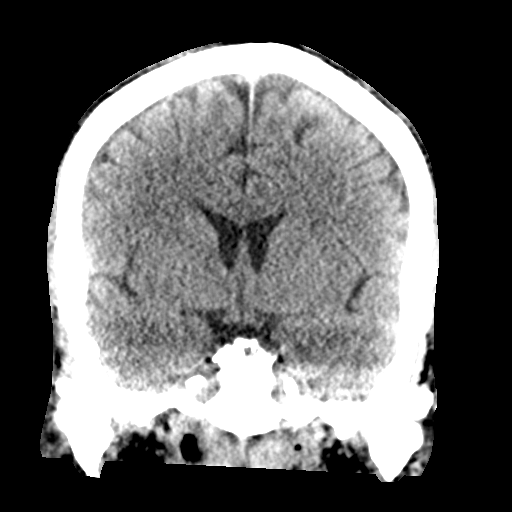
[im 44/79  brain]
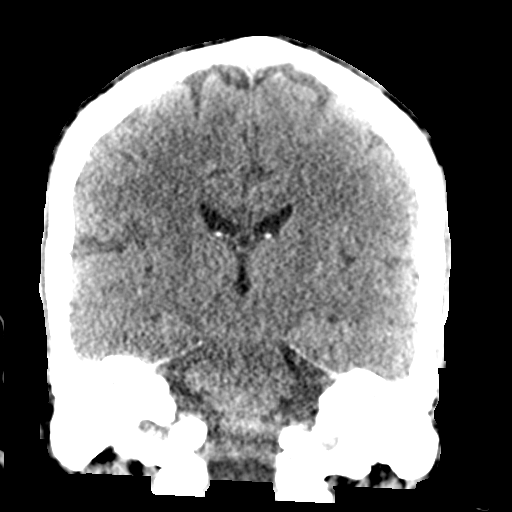

[Series 6: head without sag · sagittal · non-contrast · 0.41mm/px · 3 of 67 slices shown]
[im 23/67  brain]
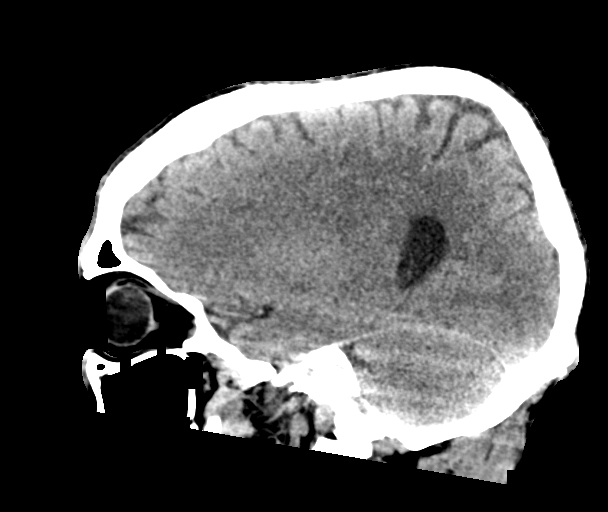
[im 34/67  brain]
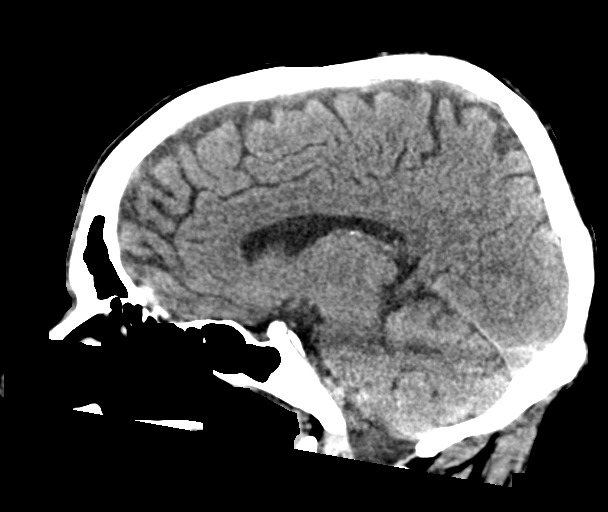
[im 45/67  brain]
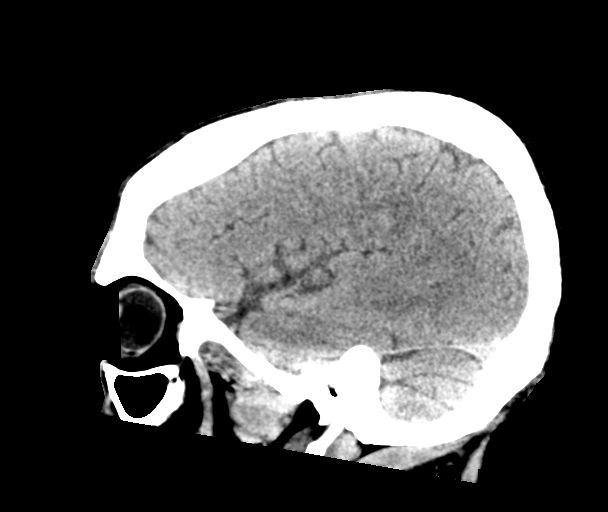

[17 of 47 positions shown; findings below may reference images not displayed]

FINDINGS: Brain: No evidence of acute infarction, hemorrhage, hydrocephalus,
extra-axial collection or mass lesion/mass effect.

Vascular: No hyperdense vessel or unexpected calcification.

Skull: Normal. Negative for fracture or focal lesion.

Sinuses/Orbits: There is mild mucosal thickening involving maxillary
sinuses. Mastoid air cells are opacified bilaterally.

Other: None
IMPRESSION: 1. No acute intracranial abnormalities.
2. Bilateral mastoid air cell effusions.

## 2021-01-23 IMAGING — DX DG ABD PORTABLE 1V
1 series · 1 of 1 positions shown · non-contrast
Comparison: [DATE]

CLINICAL DATA: Abdominal pain.

EXAM:
PORTABLE ABDOMEN - 1 VIEW

[abdomen supine]
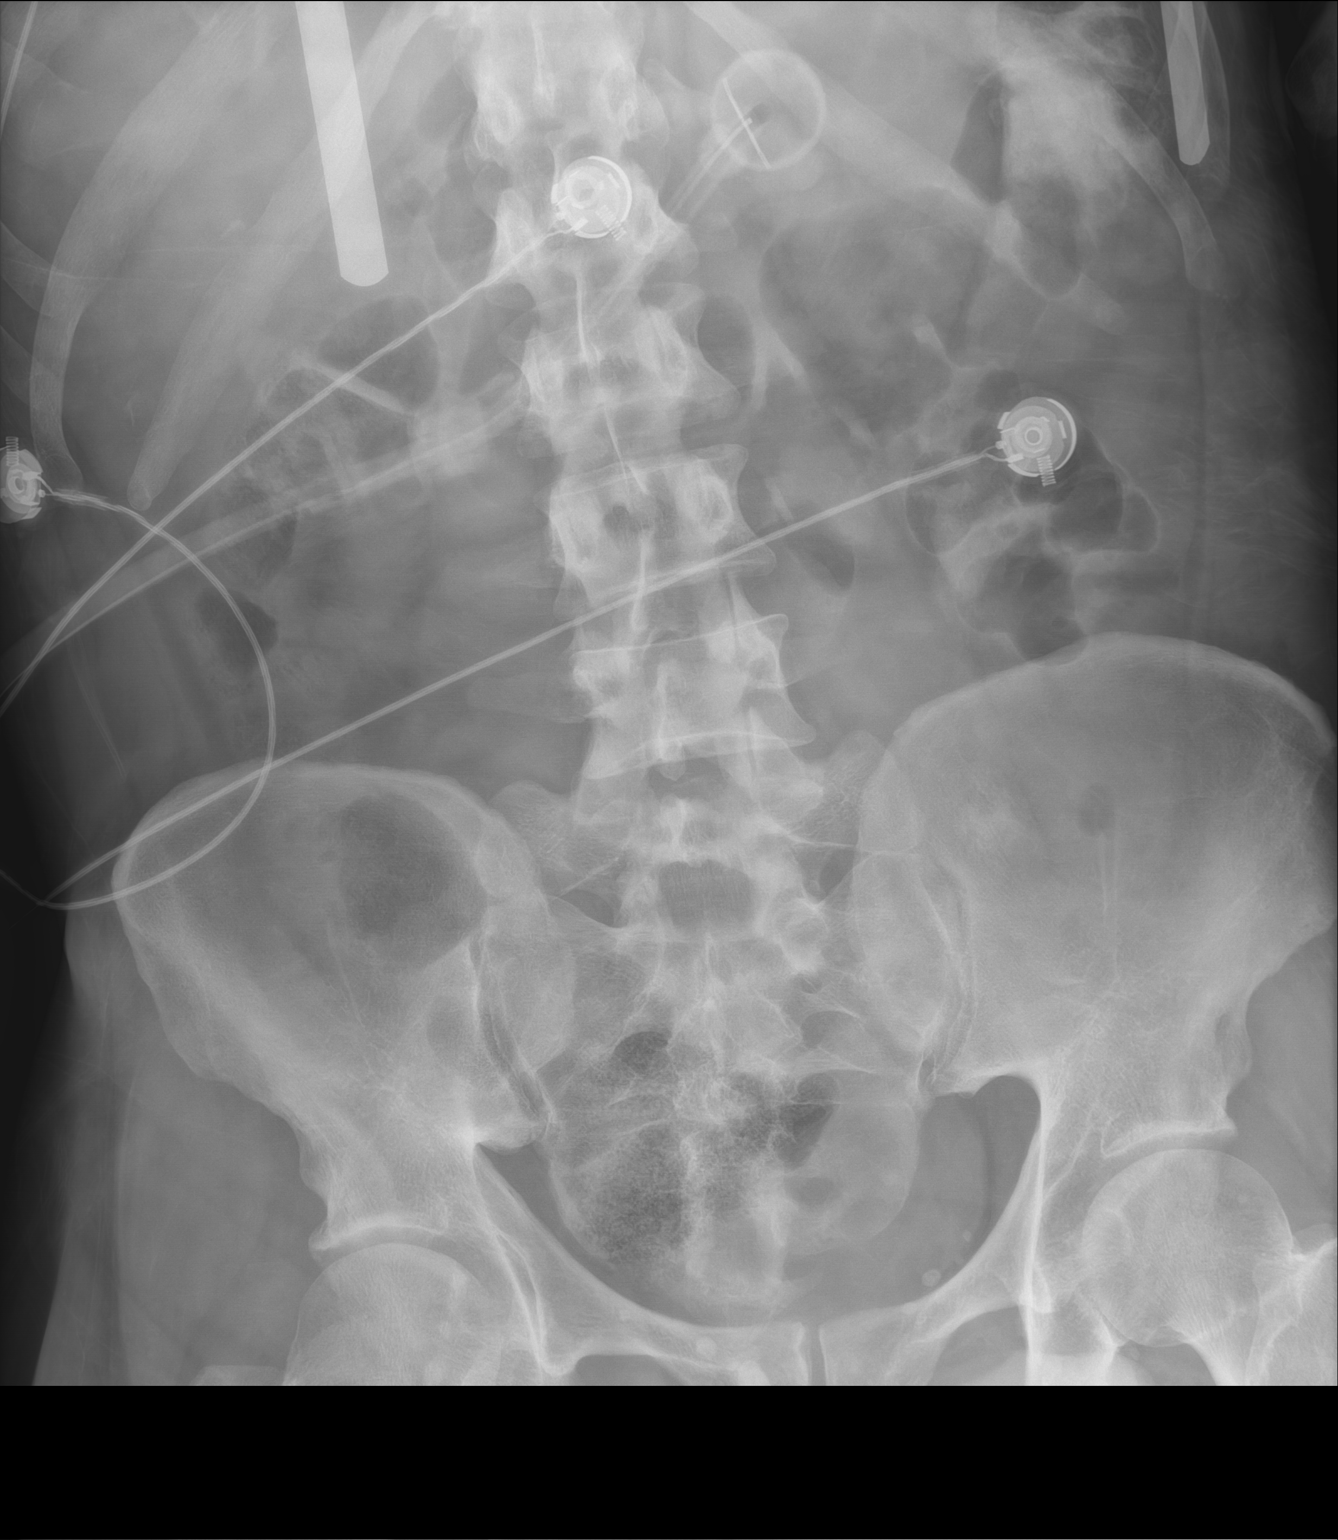

[1 of 1 positions shown; findings below may reference images not displayed]

FINDINGS: Gastrostomy tube is again noted in the left upper quadrant.

Scattered air and minimal stool in the colon. No distended small
bowel loops to suggest obstruction or ileus. The soft tissue shadows
of the abdomen are maintained. No worrisome calcifications. The bony
structures are intact.
IMPRESSION: Unremarkable abdominal radiograph.

## 2021-01-23 NOTE — Progress Notes (Signed)
Luis Hunter  PROGRESS NOTE     Luis Hunter  VCB:[id number]  DOB: Oct 08, 1975   DOA: 01/16/2021  Referring Physician: Satira Sark, MD  HPI: Luis Hunter is a 45 y.o. male being followed for ventilator/airway/oxygen weaning Acute on Chronic Respiratory Failure.  Remains off the ventilator patient has been on T collar on 28% FiO2 secretions are fairly moderate  Medications: Reviewed on Rounds  Physical Exam:  Vitals: Temperature is 97.7 pulse 70 respiratory rate is 21 blood pressure is 106/51 saturations 99%  Ventilator Settings patient remains on T collar on 28% FiO2  General: Comfortable at this time Neck: supple Cardiovascular: no malignant arrhythmias Respiratory: Coarse breath sounds with a few scattered rhonchi Skin: no rash seen on limited exam Musculoskeletal: No gross abnormality Psychiatric:unable to assess Neurologic:no involuntary movements         Lab Data:   Basic Metabolic Panel: Recent Labs  Lab 01/17/21 0327 01/23/21 0417  NA 137 138  K 4.1 3.9  CL 99 93*  CO2 28 31  GLUCOSE 118* 94  BUN 32* 20  CREATININE 0.56* 0.52*  CALCIUM 9.5 9.6    ABG: No results for input(s): PHART, PCO2ART, PO2ART, HCO3, O2SAT in the last 168 hours.  Liver Function Tests: No results for input(s): AST, ALT, ALKPHOS, BILITOT, PROT, ALBUMIN in the last 168 hours. No results for input(s): LIPASE, AMYLASE in the last 168 hours. No results for input(s): AMMONIA in the last 168 hours.  CBC: Recent Labs  Lab 01/17/21 0327 01/23/21 0417  WBC 25.6* 11.2*  HGB 10.0* 10.8*  HCT 31.5* 34.3*  MCV 89.7 91.0  PLT 418* 264    Cardiac Enzymes: No results for input(s): CKTOTAL, CKMB, CKMBINDEX, TROPONINI in the last 168 hours.  BNP (last 3 results) No results for input(s): BNP in the last 8760 hours.  ProBNP (last 3 results) No results for input(s): PROBNP in the last 8760  hours.  Radiological Exams: CT HEAD WO CONTRAST (5MM)  Result Date: 01/23/2021 CLINICAL DATA:  Head trauma.  Fall. EXAM: CT HEAD WITHOUT CONTRAST TECHNIQUE: Contiguous axial images were obtained from the base of the skull through the vertex without intravenous contrast. COMPARISON:  01/03/2021 FINDINGS: Brain: No evidence of acute infarction, hemorrhage, hydrocephalus, extra-axial collection or mass lesion/mass effect. Vascular: No hyperdense vessel or unexpected calcification. Skull: Normal. Negative for fracture or focal lesion. Sinuses/Orbits: There is mild mucosal thickening involving maxillary sinuses. Mastoid air cells are opacified bilaterally. Other: None IMPRESSION: 1. No acute intracranial abnormalities. 2. Bilateral mastoid air cell effusions. Electronically Signed   By: Kerby Moors M.D.   On: 01/23/2021 07:32    Assessment/Plan Active Problems:   Acute on chronic respiratory failure with hypoxia (HCC)   Status epilepticus (HCC)   Polysubstance abuse (HCC)   Healthcare-associated pneumonia   Acute metabolic encephalopathy   Acute on chronic respiratory failure hypoxia we will continue with the T collar patient still has very copious secretions noted. Status epilepticus no change we will continue to follow along closely. Polysubstance abuse supportive care no change Healthcare associated pneumonia treated we will continue to follow along closely. Acute metabolic encephalopathy patient is at baseline   I have personally seen and evaluated the patient, evaluated laboratory and imaging results, formulated the assessment and plan and placed orders. The Patient requires high complexity decision making with multiple systems involvement.  Rounds were done with the Respiratory Therapy Director and Staff therapists and  discussed with nursing staff also.  Allyne Gee, MD [facility] Sleep Medicine

## 2021-01-24 NOTE — Progress Notes (Signed)
P[facility] [place]  PROGRESS NOTE     Luis Hunter  [id number]:[id number]  DOB: [birth year]   DOA: 01/16/2021  Referring Physician: Satira Sark, MD  HPI: Luis Hunter is a 45 y.o. male being followed for ventilator/airway/oxygen weaning Acute on Chronic Respiratory Failure.  Continues to do well making good progress on T collar secretions are still copious but he has a very strong cough and is able to bring the secretions up  Medications: Reviewed on Rounds  Physical Exam:  Vitals: Temperature is 98.0 pulse 88 respiratory is 18 blood pressure is 108/64 saturations 99%  Ventilator Settings off the ventilator on T collar  General: Comfortable at this time Neck: supple Cardiovascular: no malignant arrhythmias Respiratory: Very coarse breath sounds scattered rhonchi Skin: no rash seen on limited exam Musculoskeletal: No gross abnormality Psychiatric:unable to assess Neurologic:no involuntary movements         Lab Data:   Basic Metabolic Panel: Recent Labs  Lab 01/23/21 0417  NA 138  K 3.9  CL 93*  CO2 31  GLUCOSE 94  BUN 20  CREATININE 0.52*  CALCIUM 9.6    ABG: No results for input(s): PHART, PCO2ART, PO2ART, HCO3, O2SAT in the last 168 hours.  Liver Function Tests: No results for input(s): AST, ALT, ALKPHOS, BILITOT, PROT, ALBUMIN in the last 168 hours. No results for input(s): LIPASE, AMYLASE in the last 168 hours. No results for input(s): AMMONIA in the last 168 hours.  CBC: Recent Labs  Lab 01/23/21 0417  WBC 11.2*  HGB 10.8*  HCT 34.3*  MCV 91.0  PLT 264    Cardiac Enzymes: No results for input(s): CKTOTAL, CKMB, CKMBINDEX, TROPONINI in the last 168 hours.  BNP (last 3 results) No results for input(s): BNP in the last 8760 hours.  ProBNP (last 3 results) No results for input(s): PROBNP in the last 8760 hours.  Radiological Exams: CT HEAD WO CONTRAST  (5MM)  Result Date: 01/23/2021 CLINICAL DATA:  Head trauma.  Fall. EXAM: CT HEAD WITHOUT CONTRAST TECHNIQUE: Contiguous axial images were obtained from the base of the skull through the vertex without intravenous contrast. COMPARISON:  01/03/2021 FINDINGS: Brain: No evidence of acute infarction, hemorrhage, hydrocephalus, extra-axial collection or mass lesion/mass effect. Vascular: No hyperdense vessel or unexpected calcification. Skull: Normal. Negative for fracture or focal lesion. Sinuses/Orbits: There is mild mucosal thickening involving maxillary sinuses. Mastoid air cells are opacified bilaterally. Other: None IMPRESSION: 1. No acute intracranial abnormalities. 2. Bilateral mastoid air cell effusions. Electronically Signed   By: Kerby Moors M.D.   On: 01/23/2021 07:32   DG Abd Portable 1V  Result Date: 01/23/2021 CLINICAL DATA:  Abdominal pain. EXAM: PORTABLE ABDOMEN - 1 VIEW COMPARISON:  01/16/2021 FINDINGS: Gastrostomy tube is again noted in the left upper quadrant. Scattered air and minimal stool in the colon. No distended small bowel loops to suggest obstruction or ileus. The soft tissue shadows of the abdomen are maintained. No worrisome calcifications. The bony structures are intact. IMPRESSION: Unremarkable abdominal radiograph. Electronically Signed   By: Marijo Sanes M.D.   On: 01/23/2021 14:53    Assessment/Plan Active Problems:   Acute on chronic respiratory failure with hypoxia (HCC)   Status epilepticus (HCC)   Polysubstance abuse ([place])   Healthcare-associated pneumonia   Acute metabolic encephalopathy   Acute on chronic respiratory failure with hypoxia the plan is going to be to continue the weaning process patient has been tolerating the PMV  so therefore we can start with capping trials Status epilepticus no seizures noted at this time Polysubstance abuse no sign of active withdrawal Healthcare associated pneumonia treated Metabolic encephalopathy no change   I have  personally seen and evaluated the patient, evaluated laboratory and imaging results, formulated the assessment and plan and placed orders. The Patient requires high complexity decision making with multiple systems involvement.  Rounds were done with the Respiratory Therapy Director and Staff therapists and discussed with nursing staff also.  Allyne Gee, MD [facility] Pulmonary Critical Care Medicine Sleep Medicine

## 2021-01-25 ENCOUNTER — Other Ambulatory Visit (HOSPITAL_COMMUNITY): Payer: Medicare HMO

## 2021-01-25 IMAGING — DX DG CHEST 1V PORT
1 series · 1 of 1 positions shown · non-contrast
Comparison: [DATE]

CLINICAL DATA: Oxygen desaturation.  Cough.

EXAM:
PORTABLE CHEST 1 VIEW

[chest ap]
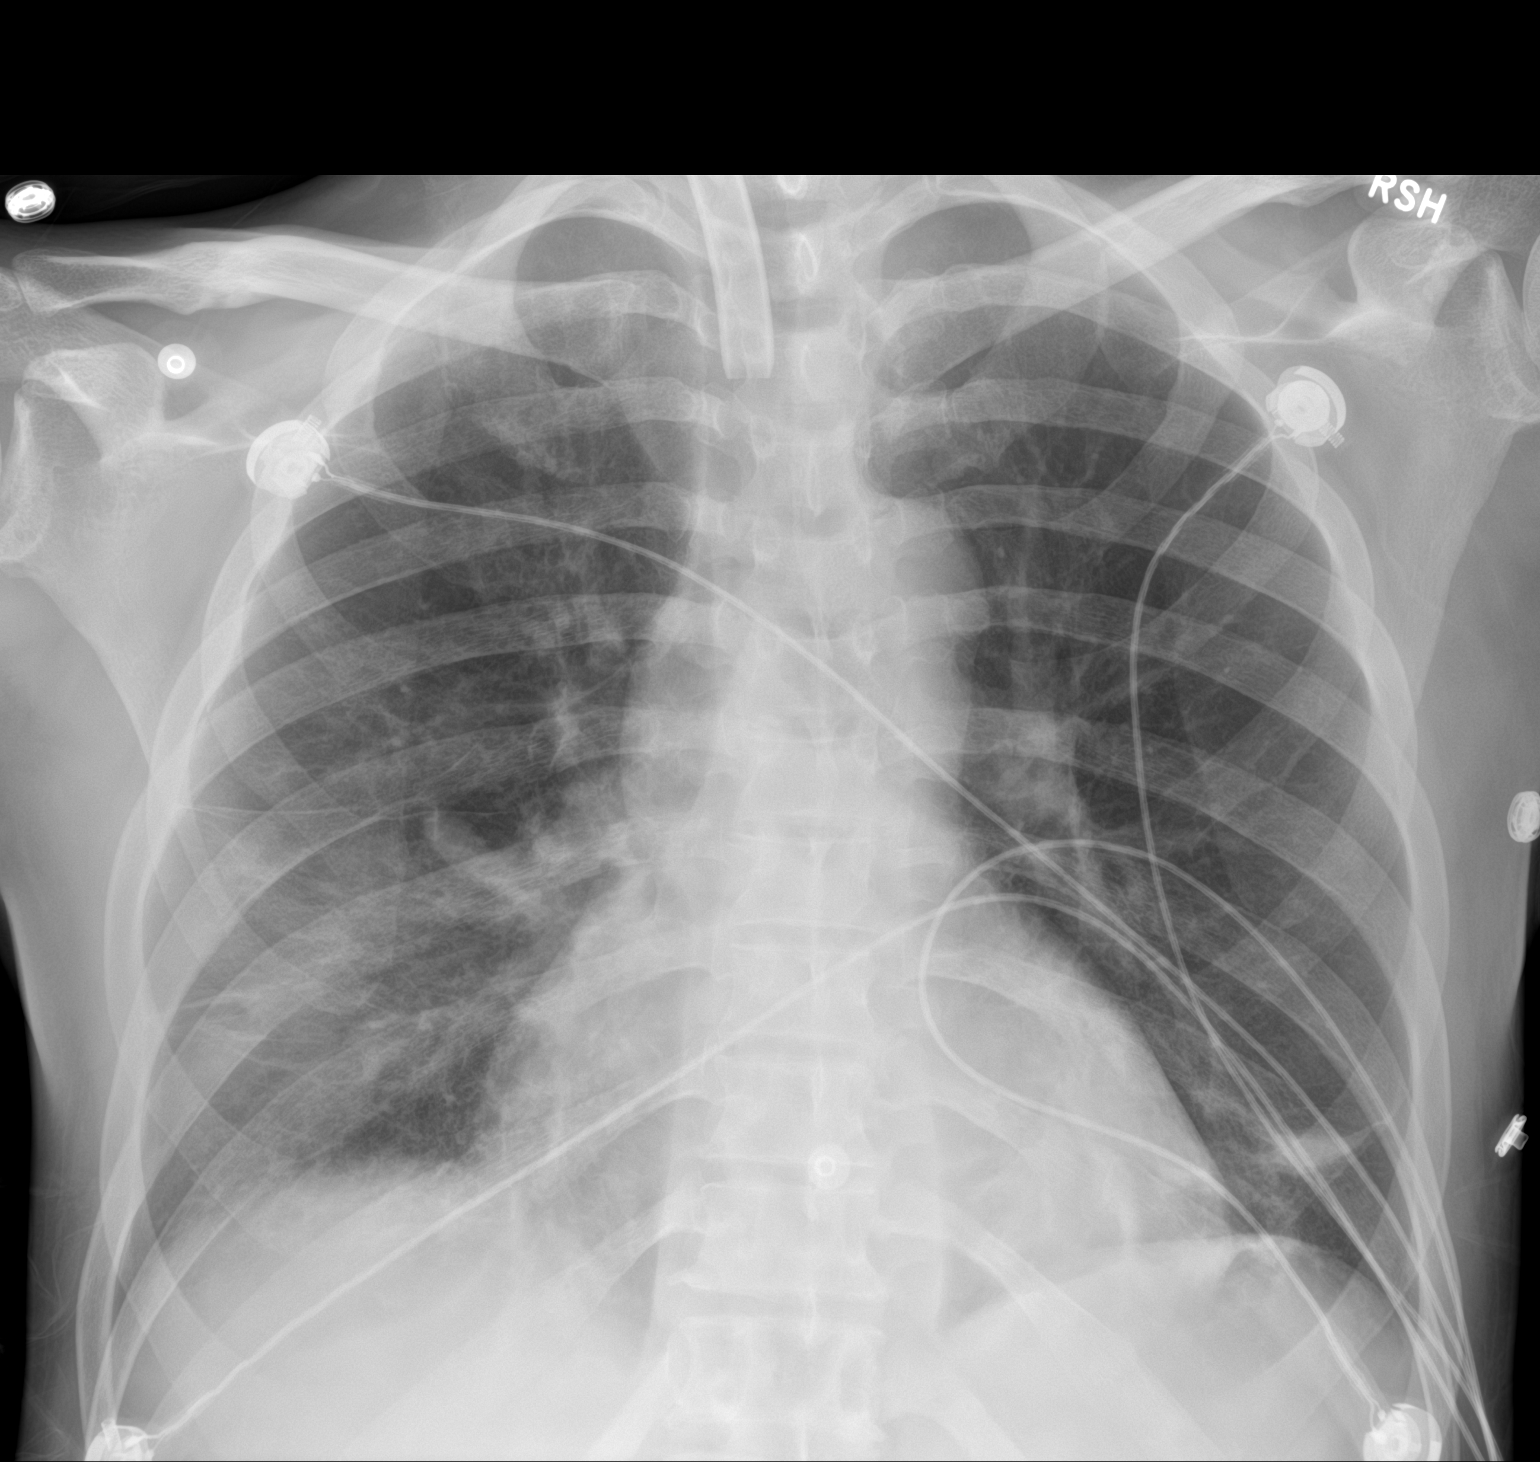

[1 of 1 positions shown; findings below may reference images not displayed]

FINDINGS: The tracheostomy tube is in good position, unchanged. The feeding
tube has been removed.

The cardiac silhouette, mediastinal and hilar contours are within
normal limits.

Improved bibasilar aeration since the prior chest x-ray. Probable
residual post pneumonic scarring or atelectasis. No definite
recurrent focal airspace consolidation and no definite pleural
effusions.
IMPRESSION: Improved bibasilar aeration since prior chest x-ray. Probable post
pneumonic scarring and atelectasis in both lower lobes.

## 2021-01-25 NOTE — Progress Notes (Signed)
Pulmonary [place]  PROGRESS NOTE     Luis Hunter  [id number]  DOB: February 19, 1976   DOA: 01/16/2021  Referring Physician: Satira Sark, MD  HPI: Luis Hunter is a 45 y.o. male being followed for ventilator/airway/oxygen weaning Acute on Chronic Respiratory Failure.  Patient is resting comfortably right now without distress no fevers are noted has been doing fine with capping yesterday secretions still an issue but he does have a good strong cough as previously noted  Medications: Reviewed on Rounds  Physical Exam:  Vitals: Temperature is 97.5 pulse 87 respiratory is 18 blood pressure is 121/72 saturations 96%  Ventilator Settings will be capping from 28%  General: Comfortable at this time Neck: supple Cardiovascular: no malignant arrhythmias Respiratory: No rhonchi very coarse breath sounds Skin: no rash seen on limited exam Musculoskeletal: No gross abnormality Psychiatric:unable to assess Neurologic:no involuntary movements         Lab Data:   Basic Metabolic Panel: Recent Labs  Lab 01/23/21 0417  NA 138  K 3.9  CL 93*  CO2 31  GLUCOSE 94  BUN 20  CREATININE 0.52*  CALCIUM 9.6    ABG: No results for input(s): PHART, PCO2ART, PO2ART, HCO3, O2SAT in the last 168 hours.  Liver Function Tests: No results for input(s): AST, ALT, ALKPHOS, BILITOT, PROT, ALBUMIN in the last 168 hours. No results for input(s): LIPASE, AMYLASE in the last 168 hours. No results for input(s): AMMONIA in the last 168 hours.  CBC: Recent Labs  Lab 01/23/21 0417  WBC 11.2*  HGB 10.8*  HCT 34.3*  MCV 91.0  PLT 264    Cardiac Enzymes: No results for input(s): CKTOTAL, CKMB, CKMBINDEX, TROPONINI in the last 168 hours.  BNP (last 3 results) No results for input(s): BNP in the last 8760 hours.  ProBNP (last 3 results) No results for input(s): PROBNP in the last 8760 hours.  Radiological  Exams: DG Abd Portable 1V  Result Date: 01/23/2021 CLINICAL DATA:  Abdominal pain. EXAM: PORTABLE ABDOMEN - 1 VIEW COMPARISON:  01/16/2021 FINDINGS: Gastrostomy tube is again noted in the left upper quadrant. Scattered air and minimal stool in the colon. No distended small bowel loops to suggest obstruction or ileus. The soft tissue shadows of the abdomen are maintained. No worrisome calcifications. The bony structures are intact. IMPRESSION: Unremarkable abdominal radiograph. Electronically Signed   By: Luis Hunter M.D.   On: 01/23/2021 14:53    Assessment/Plan Active Problems:   Acute on chronic respiratory failure with hypoxia (HCC)   Status epilepticus (HCC)   Polysubstance abuse ([place])   Healthcare-associated pneumonia   Acute metabolic encephalopathy   Acute on chronic respiratory failure with hypoxia patient continue with capping trials as tolerated work with the pulmonary toilet Status epilepticus no signs of active seizures Polysubstance abuse right now is not in any active withdrawal Healthcare associated pneumonia treated Metabolic encephalopathy no change appears to be back at baseline and awake   I have personally seen and evaluated the patient, evaluated laboratory and imaging results, formulated the assessment and plan and placed orders. The Patient requires high complexity decision making with multiple systems involvement.  Rounds were done with the Respiratory Therapy Director and Staff therapists and discussed with nursing staff also.  Allyne Gee, MD [facility] Pulmonary Critical Care Medicine Sleep Medicine

## 2021-01-26 NOTE — Progress Notes (Signed)
Luis Hunter  PROGRESS NOTE     Luis Hunter  [id number]  DOB: [birth year]   DOA: 01/16/2021  Referring Physician: Satira Sark, MD  HPI: Luis Hunter is a 45 y.o. male being followed for ventilator/airway/oxygen weaning Acute on Chronic Respiratory Failure.  Right now is afebrile without distress patient's been on T collar has been on 28% FiO2  Medications: Reviewed on Rounds  Physical Exam:  Vitals: Temperature is 97.1 pulse 81 respiratory rate is 29 blood pressure 96/60 saturations 97%  Ventilator Settings currently off ventilator on T collar FiO2 28%  General: Comfortable at this time Neck: supple Cardiovascular: no malignant arrhythmias Respiratory: Scattered rhonchi expansion is equal Skin: no rash seen on limited exam Musculoskeletal: No gross abnormality Psychiatric:unable to assess Neurologic:no involuntary movements         Lab Data:   Basic Metabolic Panel: Recent Labs  Lab 01/23/21 0417  NA 138  K 3.9  CL 93*  CO2 31  GLUCOSE 94  BUN 20  CREATININE 0.52*  CALCIUM 9.6    ABG: No results for input(s): PHART, PCO2ART, PO2ART, HCO3, O2SAT in the last 168 hours.  Liver Function Tests: No results for input(s): AST, ALT, ALKPHOS, BILITOT, PROT, ALBUMIN in the last 168 hours. No results for input(s): LIPASE, AMYLASE in the last 168 hours. No results for input(s): AMMONIA in the last 168 hours.  CBC: Recent Labs  Lab 01/23/21 0417  WBC 11.2*  HGB 10.8*  HCT 34.3*  MCV 91.0  PLT 264    Cardiac Enzymes: No results for input(s): CKTOTAL, CKMB, CKMBINDEX, TROPONINI in the last 168 hours.  BNP (last 3 results) No results for input(s): BNP in the last 8760 hours.  ProBNP (last 3 results) No results for input(s): PROBNP in the last 8760 hours.  Radiological Exams: DG Chest Port 1 View  Result Date: 01/25/2021 CLINICAL DATA:  Oxygen desaturation.   Cough. EXAM: PORTABLE CHEST 1 VIEW COMPARISON:  01/11/2021 FINDINGS: The tracheostomy tube is in good position, unchanged. The feeding tube has been removed. The cardiac silhouette, mediastinal and hilar contours are within normal limits. Improved bibasilar aeration since the prior chest x-ray. Probable residual post pneumonic scarring or atelectasis. No definite recurrent focal airspace consolidation and no definite pleural effusions. IMPRESSION: Improved bibasilar aeration since prior chest x-ray. Probable post pneumonic scarring and atelectasis in both lower lobes. Electronically Signed   By: Marijo Sanes M.D.   On: 01/25/2021 11:40    Assessment/Plan Active Problems:   Acute on chronic respiratory failure with hypoxia (HCC)   Status epilepticus (HCC)   Polysubstance abuse ([place])   Healthcare-associated pneumonia   Acute metabolic encephalopathy   Acute on chronic respiratory failure hypoxia plan is going to be to continue with the weaning process try to do capping again today was able to do capping yesterday secretions still appear to be somewhat limiting Status epilepticus no active seizure noted at this time Polysubstance abuse patient is at baseline Healthcare associated pneumonia treated improved Metabolic encephalopathy no change   I have personally seen and evaluated the patient, evaluated laboratory and imaging results, formulated the assessment and plan and placed orders. The Patient requires high complexity decision making with multiple systems involvement.  Rounds were done with the Respiratory Therapy Director and Staff therapists and discussed with nursing staff also.  Allyne Gee, MD [facility] Sleep Medicine

## 2021-01-27 LAB — BASIC METABOLIC PANEL
Anion gap: 5 (ref 5–15)
BUN: 11 mg/dL (ref 6–20)
CO2: 28 mmol/L (ref 22–32)
Calcium: 8.8 mg/dL — ABNORMAL LOW (ref 8.9–10.3)
Chloride: 100 mmol/L (ref 98–111)
Creatinine, Ser: 0.59 mg/dL — ABNORMAL LOW (ref 0.61–1.24)
GFR, Estimated: >60 mL/min (ref >60)
Glucose, Bld: 101 mg/dL — ABNORMAL HIGH (ref 70–99)
Potassium: 3.5 mmol/L (ref 3.5–5.1)
Sodium: 133 mmol/L — ABNORMAL LOW (ref 135–145)

## 2021-01-27 LAB — CBC
HCT: 30.6 % — ABNORMAL LOW (ref 39.0–52.0)
Hemoglobin: 9.7 g/dL — ABNORMAL LOW (ref 13.0–17.0)
MCH: 28.6 pg (ref 26.0–34.0)
MCHC: 31.7 g/dL (ref 30.0–36.0)
MCV: 90.3 fL (ref 80.0–100.0)
Platelets: 223 10*3/uL (ref 150–400)
RBC: 3.39 MIL/uL — ABNORMAL LOW (ref 4.22–5.81)
RDW: 16.1 % — ABNORMAL HIGH (ref 11.5–15.5)
WBC: 9.5 10*3/uL (ref 4.0–10.5)
nRBC: 0 % (ref 0.0–0.2)

## 2021-01-28 LAB — BASIC METABOLIC PANEL
Anion gap: 10 (ref 5–15)
BUN: 9 mg/dL (ref 6–20)
CO2: 29 mmol/L (ref 22–32)
Calcium: 9.2 mg/dL (ref 8.9–10.3)
Chloride: 99 mmol/L (ref 98–111)
Creatinine, Ser: 0.63 mg/dL (ref 0.61–1.24)
GFR, Estimated: >60 mL/min (ref >60)
Glucose, Bld: 93 mg/dL (ref 70–99)
Potassium: 3.6 mmol/L (ref 3.5–5.1)
Sodium: 138 mmol/L (ref 135–145)

## 2021-01-28 LAB — MAGNESIUM: Magnesium: 1.8 mg/dL (ref 1.7–2.4)

## 2021-01-28 LAB — CBC
HCT: 32.6 % — ABNORMAL LOW (ref 39.0–52.0)
Hemoglobin: 10.6 g/dL — ABNORMAL LOW (ref 13.0–17.0)
MCH: 29.4 pg (ref 26.0–34.0)
MCHC: 32.5 g/dL (ref 30.0–36.0)
MCV: 90.6 fL (ref 80.0–100.0)
Platelets: 238 10*3/uL (ref 150–400)
RBC: 3.6 MIL/uL — ABNORMAL LOW (ref 4.22–5.81)
RDW: 16.3 % — ABNORMAL HIGH (ref 11.5–15.5)
WBC: 12.8 10*3/uL — ABNORMAL HIGH (ref 4.0–10.5)
nRBC: 0 % (ref 0.0–0.2)

## 2021-01-28 NOTE — Progress Notes (Signed)
Pulmonary [place]  PROGRESS NOTE     Luis Hunter  [id number]  DOB: October 10, 1975   DOA: 01/16/2021  Referring Physician: Satira Sark, MD  HPI: Luis Hunter is a 45 y.o. male being followed for ventilator/airway/oxygen weaning Acute on Chronic Respiratory Failure.  Patient currently is on T collar has been on 28% FiO2 with good saturations.  Secretions are fairly moderate  Medications: Reviewed on Rounds  Physical Exam:  Vitals: Temperature is 97.3 pulse 110 respiratory rate is 20 blood pressure is 148/90 saturations 98%  Ventilator Settings on T collar with an FiO2 28%  General: Comfortable at this time Neck: supple Cardiovascular: no malignant arrhythmias Respiratory: Coarse rhonchi expansion is equal Skin: no rash seen on limited exam Musculoskeletal: No gross abnormality Psychiatric:unable to assess Neurologic:no involuntary movements         Lab Data:   Basic Metabolic Panel: Recent Labs  Lab 01/23/21 0417 01/27/21 0445  NA 138 133*  K 3.9 3.5  CL 93* 100  CO2 31 28  GLUCOSE 94 101*  BUN 20 11  CREATININE 0.52* 0.59*  CALCIUM 9.6 8.8*    ABG: No results for input(s): PHART, PCO2ART, PO2ART, HCO3, O2SAT in the last 168 hours.  Liver Function Tests: No results for input(s): AST, ALT, ALKPHOS, BILITOT, PROT, ALBUMIN in the last 168 hours. No results for input(s): LIPASE, AMYLASE in the last 168 hours. No results for input(s): AMMONIA in the last 168 hours.  CBC: Recent Labs  Lab 01/23/21 0417 01/27/21 0445  WBC 11.2* 9.5  HGB 10.8* 9.7*  HCT 34.3* 30.6*  MCV 91.0 90.3  PLT 264 223    Cardiac Enzymes: No results for input(s): CKTOTAL, CKMB, CKMBINDEX, TROPONINI in the last 168 hours.  BNP (last 3 results) No results for input(s): BNP in the last 8760 hours.  ProBNP (last 3 results) No results for input(s): PROBNP in the last 8760 hours.  Radiological  Exams: No results found.  Assessment/Plan Active Problems:   Acute on chronic respiratory failure with hypoxia (HCC)   Status epilepticus (HCC)   Polysubstance abuse (HCC)   Healthcare-associated pneumonia   Acute metabolic encephalopathy   Acute on chronic respiratory failure hypoxia we will continue with the T collar titrate oxygen as tolerated continue pulmonary toilet secretions are moderate. Status epilepticus no active seizure noted at this time Polysubstance abuse patient is at baseline right now Healthcare associated pneumonia treated Acute metabolic encephalopathy no change we will continue to follow along closely   I have personally seen and evaluated the patient, evaluated laboratory and imaging results, formulated the assessment and plan and placed orders. The Patient requires high complexity decision making with multiple systems involvement.  Rounds were done with the Respiratory Therapy Director and Staff therapists and discussed with nursing staff also.  Allyne Gee, MD [facility] Pulmonary Critical Care Medicine Sleep Medicine

## 2021-01-29 DIAGNOSIS — F1910 Other psychoactive substance abuse, uncomplicated: Secondary | ICD-10-CM | POA: Diagnosis not present

## 2021-01-29 DIAGNOSIS — G9341 Metabolic encephalopathy: Secondary | ICD-10-CM | POA: Diagnosis not present

## 2021-01-29 DIAGNOSIS — J9621 Acute and chronic respiratory failure with hypoxia: Secondary | ICD-10-CM | POA: Diagnosis not present

## 2021-01-29 DIAGNOSIS — J189 Pneumonia, unspecified organism: Secondary | ICD-10-CM | POA: Diagnosis not present

## 2021-01-29 NOTE — Progress Notes (Signed)
Pulmonary [place]  PROGRESS NOTE     Luis Hunter  [id number]  DOB: 03-Oct-1975   DOA: 01/16/2021  Referring Physician: Satira Sark, MD  HPI: Luis Hunter is a 45 y.o. male being followed for ventilator/airway/oxygen weaning Acute on Chronic Respiratory Failure.  Comfortable right now without distress no fevers are noted at this time  Medications: Reviewed on Rounds  Physical Exam:  Vitals: Temperature is 97.5 pulse 70 respiratory rate is 19 blood pressure 147/81 saturations 100%  Ventilator Settings on T collar currently on 28% FiO2  General: Comfortable at this time Neck: supple Cardiovascular: no malignant arrhythmias Respiratory: Coarse rhonchi noted bilaterally. Skin: no rash seen on limited exam Musculoskeletal: No gross abnormality Psychiatric:unable to assess Neurologic:no involuntary movements         Lab Data:   Basic Metabolic Panel: Recent Labs  Lab 01/23/21 0417 01/27/21 0445 01/28/21 1135  NA 138 133* 138  K 3.9 3.5 3.6  CL 93* 100 99  CO2 31 28 29   GLUCOSE 94 101* 93  BUN 20 11 9   CREATININE 0.52* 0.59* 0.63  CALCIUM 9.6 8.8* 9.2  MG  --   --  1.8    ABG: No results for input(s): PHART, PCO2ART, PO2ART, HCO3, O2SAT in the last 168 hours.  Liver Function Tests: No results for input(s): AST, ALT, ALKPHOS, BILITOT, PROT, ALBUMIN in the last 168 hours. No results for input(s): LIPASE, AMYLASE in the last 168 hours. No results for input(s): AMMONIA in the last 168 hours.  CBC: Recent Labs  Lab 01/23/21 0417 01/27/21 0445 01/28/21 1135  WBC 11.2* 9.5 12.8*  HGB 10.8* 9.7* 10.6*  HCT 34.3* 30.6* 32.6*  MCV 91.0 90.3 90.6  PLT 264 223 238    Cardiac Enzymes: No results for input(s): CKTOTAL, CKMB, CKMBINDEX, TROPONINI in the last 168 hours.  BNP (last 3 results) No results for input(s): BNP in the last 8760 hours.  ProBNP (last 3 results) No  results for input(s): PROBNP in the last 8760 hours.  Radiological Exams: No results found.  Assessment/Plan Active Problems:   Acute on chronic respiratory failure with hypoxia (HCC)   Status epilepticus (HCC)   Polysubstance abuse (HCC)   Healthcare-associated pneumonia   Acute metabolic encephalopathy   Acute on chronic respiratory failure hypoxia patient continues on T collar copious secretions at this time.  We will continue with aggressive pulmonary toilet. Status epilepticus no seizures noted at this time Healthcare associated pneumonia treated Polysubstance abuse no change continue to monitor Acute metabolic encephalopathy no change we will continue with supportive care   I have personally seen and evaluated the patient, evaluated laboratory and imaging results, formulated the assessment and plan and placed orders. The Patient requires high complexity decision making with multiple systems involvement.  Rounds were done with the Respiratory Therapy Director and Staff therapists and discussed with nursing staff also.  Allyne Gee, MD [facility] Pulmonary Critical Care Medicine Sleep Medicine

## 2021-01-30 DIAGNOSIS — G9341 Metabolic encephalopathy: Secondary | ICD-10-CM | POA: Diagnosis not present

## 2021-01-30 DIAGNOSIS — J189 Pneumonia, unspecified organism: Secondary | ICD-10-CM | POA: Diagnosis not present

## 2021-01-30 DIAGNOSIS — F1910 Other psychoactive substance abuse, uncomplicated: Secondary | ICD-10-CM | POA: Diagnosis not present

## 2021-01-30 DIAGNOSIS — J9621 Acute and chronic respiratory failure with hypoxia: Secondary | ICD-10-CM | POA: Diagnosis not present

## 2021-01-30 LAB — BASIC METABOLIC PANEL
Anion gap: 5 (ref 5–15)
BUN: 5 mg/dL — ABNORMAL LOW (ref 6–20)
CO2: 30 mmol/L (ref 22–32)
Calcium: 8.8 mg/dL — ABNORMAL LOW (ref 8.9–10.3)
Chloride: 100 mmol/L (ref 98–111)
Creatinine, Ser: 0.51 mg/dL — ABNORMAL LOW (ref 0.61–1.24)
GFR, Estimated: >60 mL/min (ref >60)
Glucose, Bld: 100 mg/dL — ABNORMAL HIGH (ref 70–99)
Potassium: 3.2 mmol/L — ABNORMAL LOW (ref 3.5–5.1)
Sodium: 135 mmol/L (ref 135–145)

## 2021-01-30 LAB — CBC
HCT: 32.3 % — ABNORMAL LOW (ref 39.0–52.0)
Hemoglobin: 10.6 g/dL — ABNORMAL LOW (ref 13.0–17.0)
MCH: 29.5 pg (ref 26.0–34.0)
MCHC: 32.8 g/dL (ref 30.0–36.0)
MCV: 90 fL (ref 80.0–100.0)
Platelets: 277 10*3/uL (ref 150–400)
RBC: 3.59 MIL/uL — ABNORMAL LOW (ref 4.22–5.81)
RDW: 16.2 % — ABNORMAL HIGH (ref 11.5–15.5)
WBC: 10.4 10*3/uL (ref 4.0–10.5)
nRBC: 0 % (ref 0.0–0.2)

## 2021-01-30 NOTE — Progress Notes (Signed)
[facility] [place]  PROGRESS NOTE     Luis Hunter  CWU:[id number]  DOB: 11/09/1975   DOA: 01/16/2021  Referring Physician: Satira Sark, MD  HPI: Luis Hunter is a 45 y.o. male being followed for ventilator/airway/oxygen weaning Acute on Chronic Respiratory Failure.  Patient at this time is on T collar good saturations are noted at this time  Medications: Reviewed on Rounds  Physical Exam:  Vitals: Temperature is 97.6 pulse 85 respiratory 18 blood pressure is 121/79 saturations 94%  Ventilator Settings T collar  General: Comfortable at this time Neck: supple Cardiovascular: no malignant arrhythmias Respiratory: No rhonchi very coarse breath sounds Skin: no rash seen on limited exam Musculoskeletal: No gross abnormality Psychiatric:unable to assess Neurologic:no involuntary movements         Lab Data:   Basic Metabolic Panel: Recent Labs  Lab 01/27/21 0445 01/28/21 1135 01/30/21 0644  NA 133* 138 135  K 3.5 3.6 3.2*  CL 100 99 100  CO2 28 29 30   GLUCOSE 101* 93 100*  BUN 11 9 5*  CREATININE 0.59* 0.63 0.51*  CALCIUM 8.8* 9.2 8.8*  MG  --  1.8  --     ABG: No results for input(s): PHART, PCO2ART, PO2ART, HCO3, O2SAT in the last 168 hours.  Liver Function Tests: No results for input(s): AST, ALT, ALKPHOS, BILITOT, PROT, ALBUMIN in the last 168 hours. No results for input(s): LIPASE, AMYLASE in the last 168 hours. No results for input(s): AMMONIA in the last 168 hours.  CBC: Recent Labs  Lab 01/27/21 0445 01/28/21 1135 01/30/21 0644  WBC 9.5 12.8* 10.4  HGB 9.7* 10.6* 10.6*  HCT 30.6* 32.6* 32.3*  MCV 90.3 90.6 90.0  PLT 223 238 277    Cardiac Enzymes: No results for input(s): CKTOTAL, CKMB, CKMBINDEX, TROPONINI in the last 168 hours.  BNP (last 3 results) No results for input(s): BNP in the last 8760 hours.  ProBNP (last 3 results) No results for  input(s): PROBNP in the last 8760 hours.  Radiological Exams: No results found.  Assessment/Plan Active Problems:   Acute on chronic respiratory failure with hypoxia (HCC)   Status epilepticus (HCC)   Polysubstance abuse (HCC)   Healthcare-associated pneumonia   Acute metabolic encephalopathy   Acute on chronic respiratory failure hypoxia we will continue with T collar because of excessive secretions.  We will continue with supportive care pulmonary toilet. Status epilepticus patient right now is at baseline we will continue to follow along closely. Polysubstance abuse no change Healthcare associated pneumonia treated with antibiotics Metabolic encephalopathy waxing and waning status   I have personally seen and evaluated the patient, evaluated laboratory and imaging results, formulated the assessment and plan and placed orders. The Patient requires high complexity decision making with multiple systems involvement.  Rounds were done with the Respiratory Therapy Director and Staff therapists and discussed with nursing staff also.  Allyne Gee, MD [facility] Pulmonary Critical Care Medicine Sleep Medicine

## 2021-01-31 ENCOUNTER — Other Ambulatory Visit (HOSPITAL_COMMUNITY): Payer: Medicare HMO

## 2021-01-31 DIAGNOSIS — J9621 Acute and chronic respiratory failure with hypoxia: Secondary | ICD-10-CM | POA: Diagnosis not present

## 2021-01-31 DIAGNOSIS — F1910 Other psychoactive substance abuse, uncomplicated: Secondary | ICD-10-CM | POA: Diagnosis not present

## 2021-01-31 DIAGNOSIS — J189 Pneumonia, unspecified organism: Secondary | ICD-10-CM | POA: Diagnosis not present

## 2021-01-31 DIAGNOSIS — G9341 Metabolic encephalopathy: Secondary | ICD-10-CM | POA: Diagnosis not present

## 2021-01-31 LAB — POTASSIUM: Potassium: 3.5 mmol/L (ref 3.5–5.1)

## 2021-01-31 IMAGING — DX DG CHEST 1V PORT
2 series · 2 of 2 positions shown · non-contrast
Comparison: Six days ago

CLINICAL DATA: Bronchitis

EXAM:
PORTABLE CHEST 1 VIEW

[chest ap (1 of 2)]
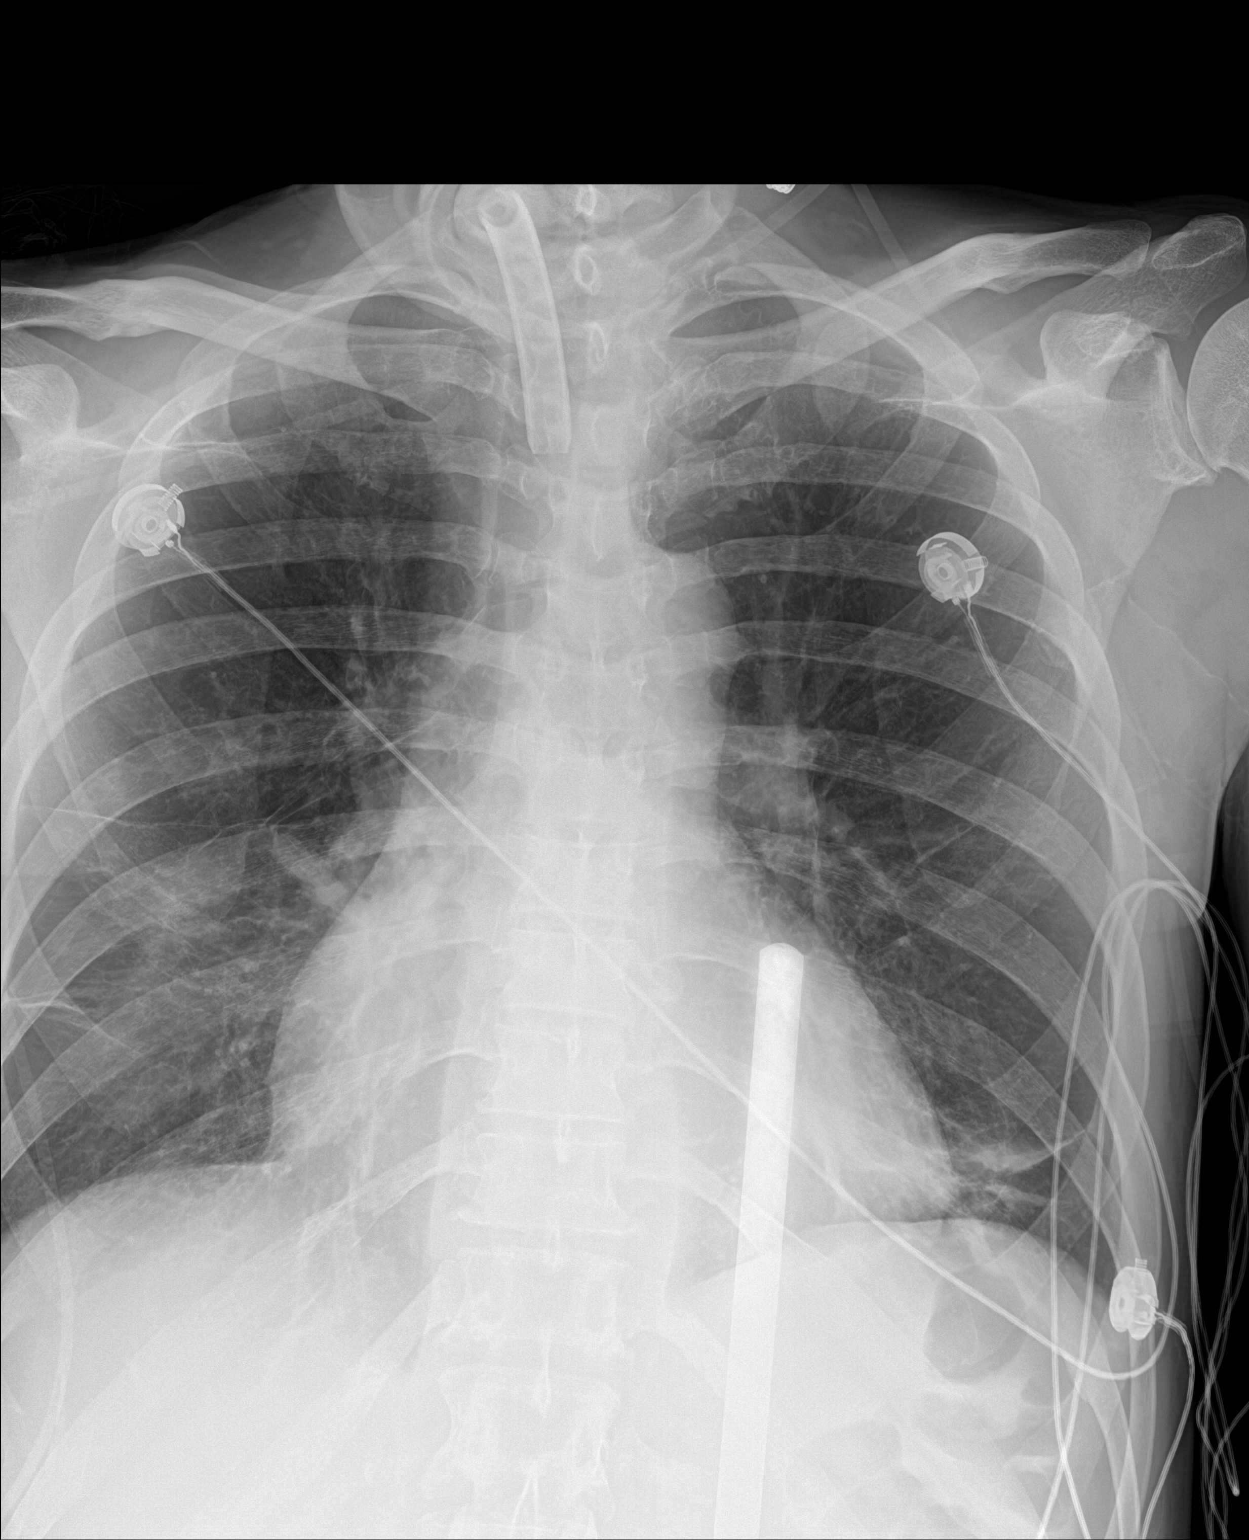

[chest ap (2 of 2)]
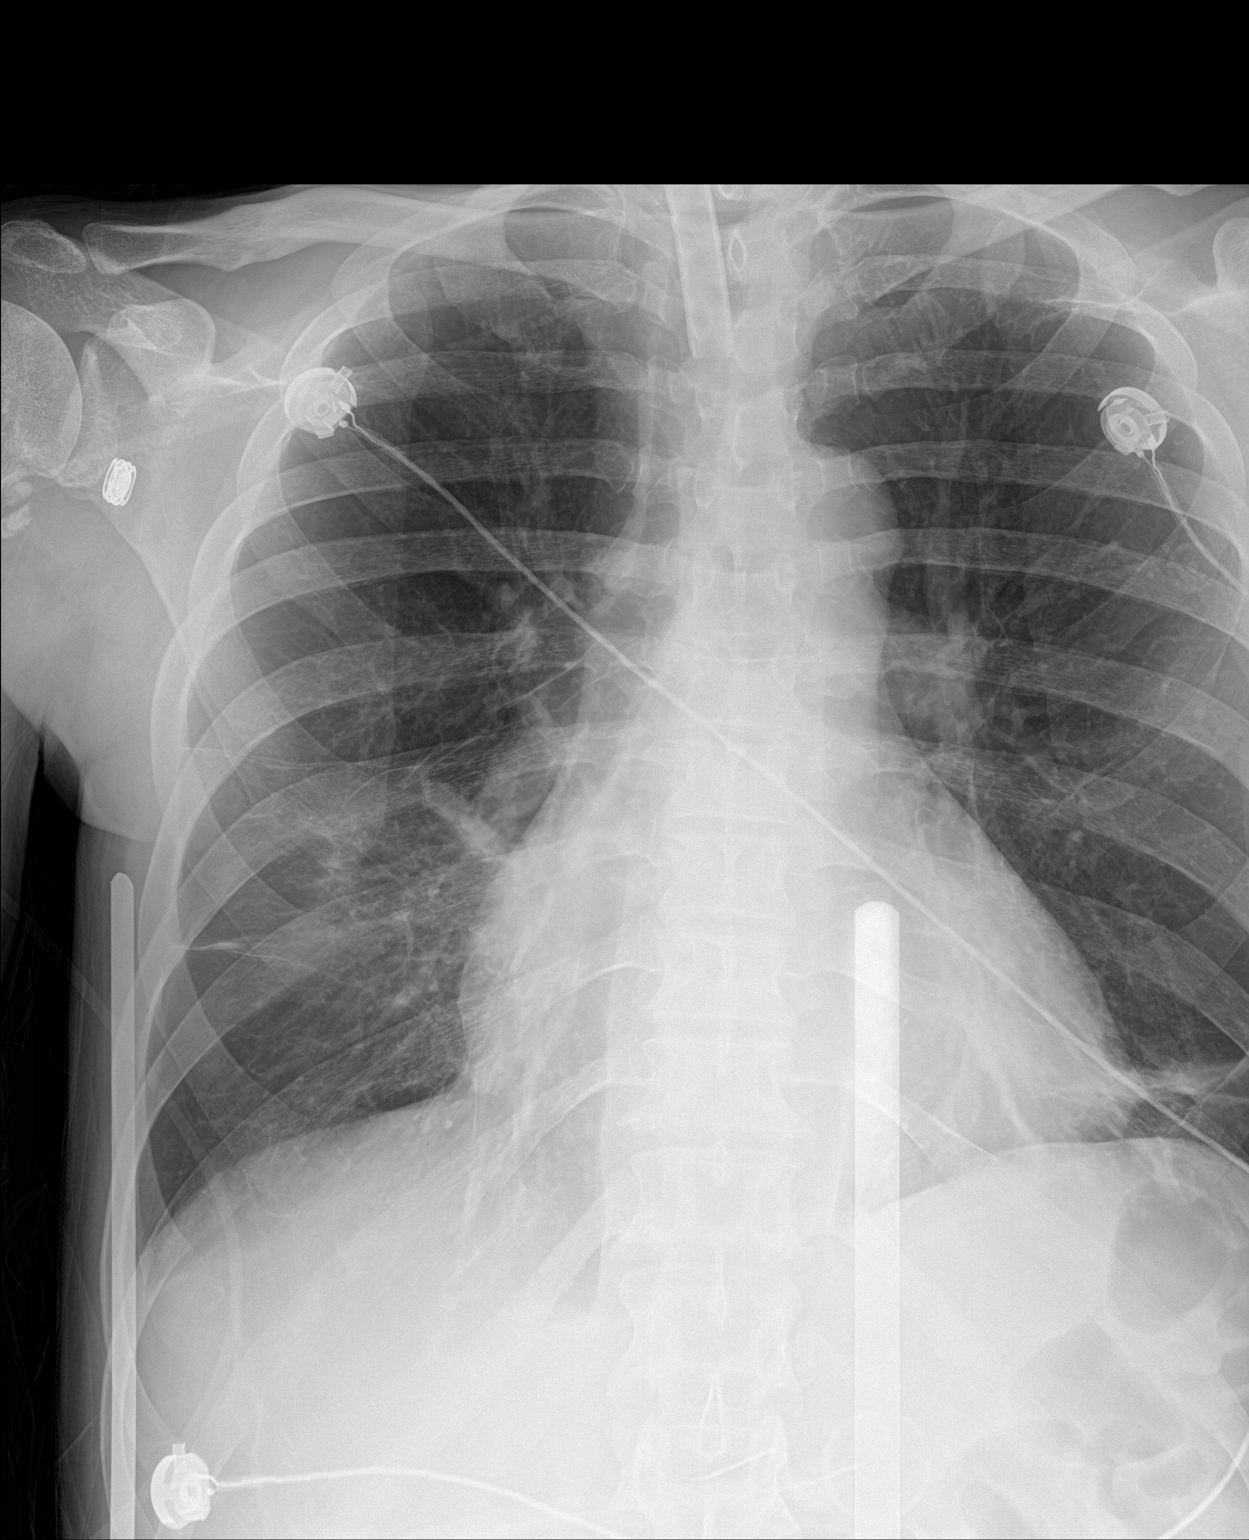

[2 of 2 positions shown; findings below may reference images not displayed]

FINDINGS: Streaky, indistinct density in the bilateral chest. Stable heart
size and mediastinal contours. Tracheostomy tube is well-positioned.
IMPRESSION: Stable atelectasis and scarring.  No new abnormality

## 2021-01-31 NOTE — Progress Notes (Signed)
Pulmonary [place]  PROGRESS NOTE     SEVILLE DOWNS  [id number]:[id number]  DOB: [birth year]   DOA: 01/16/2021  Referring Physician: Satira Sark, MD  HPI: KALIN KYLER is a 45 y.o. male being followed for ventilator/airway/oxygen weaning Acute on Chronic Respiratory Failure.  Right now is afebrile without distress patient is comfortable has been on the T collar.  Patient has very copious secretions that are not improving.  I reviewed the x-rays personally as well as the CT scan of the head personally patient does have significant sinusitis and he had been growing Serratia.  Patient has been started on antibiotics I think he might be better with that Bactrim penetration into the sinuses which may hopefully improve the secretion production  Medications: Reviewed on Rounds  Physical Exam:  Vitals: Temperature is 97.0 pulse 70 respiratory is 21 blood pressure is 135/60 saturations 98%  Ventilator Settings off ventilator on T collar  General: Comfortable at this time Neck: supple Cardiovascular: no malignant arrhythmias Respiratory: Very coarse rhonchi mainly upper airway secretions Skin: no rash seen on limited exam Musculoskeletal: No gross abnormality Psychiatric:unable to assess Neurologic:no involuntary movements         Lab Data:   Basic Metabolic Panel: Recent Labs  Lab 01/27/21 0445 01/28/21 1135 01/30/21 0644 01/31/21 0427  NA 133* 138 135  --   K 3.5 3.6 3.2* 3.5  CL 100 99 100  --   CO2 28 29 30   --   GLUCOSE 101* 93 100*  --   BUN 11 9 5*  --   CREATININE 0.59* 0.63 0.51*  --   CALCIUM 8.8* 9.2 8.8*  --   MG  --  1.8  --   --     ABG: No results for input(s): PHART, PCO2ART, PO2ART, HCO3, O2SAT in the last 168 hours.  Liver Function Tests: No results for input(s): AST, ALT, ALKPHOS, BILITOT, PROT, ALBUMIN in the last 168 hours. No results for input(s): LIPASE, AMYLASE in the  last 168 hours. No results for input(s): AMMONIA in the last 168 hours.  CBC: Recent Labs  Lab 01/27/21 0445 01/28/21 1135 01/30/21 0644  WBC 9.5 12.8* 10.4  HGB 9.7* 10.6* 10.6*  HCT 30.6* 32.6* 32.3*  MCV 90.3 90.6 90.0  PLT 223 238 277    Cardiac Enzymes: No results for input(s): CKTOTAL, CKMB, CKMBINDEX, TROPONINI in the last 168 hours.  BNP (last 3 results) No results for input(s): BNP in the last 8760 hours.  ProBNP (last 3 results) No results for input(s): PROBNP in the last 8760 hours.  Radiological Exams: No results found.  Assessment/Plan Active Problems:   Acute on chronic respiratory failure with hypoxia (HCC)   Status epilepticus (HCC)   Polysubstance abuse (HCC)   Healthcare-associated pneumonia   Acute metabolic encephalopathy   Acute on chronic respiratory failure hypoxia we will continue with the T collar continue with ongoing aggressive pulmonary toilet. Status epilepticus no seizures noted at this time Sinusitis suggested treating him as a chronic sinusitis with prolonged antibiotics and switch over to Bactrim Healthcare associated pneumonia treated we will continue to follow along Metabolic encephalopathy appears to be now at baseline   I have personally seen and evaluated the patient, evaluated laboratory and imaging results, formulated the assessment and plan and placed orders. The Patient requires high complexity decision making with multiple systems involvement.  Rounds were done with the Respiratory Therapy Director and Staff  therapists and discussed with nursing staff also.  Allyne Gee, MD [facility] Pulmonary Critical Care Medicine Sleep Medicine

## 2021-02-01 DIAGNOSIS — J189 Pneumonia, unspecified organism: Secondary | ICD-10-CM | POA: Diagnosis not present

## 2021-02-01 DIAGNOSIS — J9621 Acute and chronic respiratory failure with hypoxia: Secondary | ICD-10-CM | POA: Diagnosis not present

## 2021-02-01 DIAGNOSIS — G9341 Metabolic encephalopathy: Secondary | ICD-10-CM | POA: Diagnosis not present

## 2021-02-01 DIAGNOSIS — F1910 Other psychoactive substance abuse, uncomplicated: Secondary | ICD-10-CM | POA: Diagnosis not present

## 2021-02-01 LAB — CBC
HCT: 31.5 % — ABNORMAL LOW (ref 39.0–52.0)
Hemoglobin: 9.7 g/dL — ABNORMAL LOW (ref 13.0–17.0)
MCH: 28.3 pg (ref 26.0–34.0)
MCHC: 30.8 g/dL (ref 30.0–36.0)
MCV: 91.8 fL (ref 80.0–100.0)
Platelets: 280 10*3/uL (ref 150–400)
RBC: 3.43 MIL/uL — ABNORMAL LOW (ref 4.22–5.81)
RDW: 15.9 % — ABNORMAL HIGH (ref 11.5–15.5)
WBC: 9.1 10*3/uL (ref 4.0–10.5)
nRBC: 0 % (ref 0.0–0.2)

## 2021-02-01 LAB — BASIC METABOLIC PANEL
Anion gap: 6 (ref 5–15)
BUN: 9 mg/dL (ref 6–20)
CO2: 31 mmol/L (ref 22–32)
Calcium: 9 mg/dL (ref 8.9–10.3)
Chloride: 103 mmol/L (ref 98–111)
Creatinine, Ser: 0.59 mg/dL — ABNORMAL LOW (ref 0.61–1.24)
GFR, Estimated: >60 mL/min (ref >60)
Glucose, Bld: 105 mg/dL — ABNORMAL HIGH (ref 70–99)
Potassium: 3.5 mmol/L (ref 3.5–5.1)
Sodium: 140 mmol/L (ref 135–145)

## 2021-02-01 LAB — CULTURE, RESPIRATORY W GRAM STAIN

## 2021-02-01 LAB — MAGNESIUM: Magnesium: 1.8 mg/dL (ref 1.7–2.4)

## 2021-02-01 NOTE — Progress Notes (Signed)
Pulmonary [place]  PROGRESS NOTE     Luis Hunter  [id number]:[id number]  DOB: Feb 06, 1976   DOA: 01/16/2021  Referring Physician: Satira Sark, MD  HPI: Luis Hunter is a 45 y.o. male being followed for ventilator/airway/oxygen weaning Acute on Chronic Respiratory Failure.  He is on T collar sitting out of bed in the chair looks little bit better his cough is improved with some slight improvement in secretions.  Medications: Reviewed on Rounds  Physical Exam:  Vitals: Temperature is 97.3 pulse 72 respiratory rate 19 blood pressure 135/70 saturations 95%  Ventilator Settings on T collar with an FiO2 28%  General: Comfortable at this time Neck: supple Cardiovascular: no malignant arrhythmias Respiratory: Coarse breath sounds with a few scattered rhonchi Skin: no rash seen on limited exam Musculoskeletal: No gross abnormality Psychiatric:unable to assess Neurologic:no involuntary movements         Lab Data:   Basic Metabolic Panel: Recent Labs  Lab 01/27/21 0445 01/28/21 1135 01/30/21 0644 01/31/21 0427 02/01/21 0633  NA 133* 138 135  --  140  K 3.5 3.6 3.2* 3.5 3.5  CL 100 99 100  --  103  CO2 28 29 30   --  31  GLUCOSE 101* 93 100*  --  105*  BUN 11 9 5*  --  9  CREATININE 0.59* 0.63 0.51*  --  0.59*  CALCIUM 8.8* 9.2 8.8*  --  9.0  MG  --  1.8  --   --  1.8    ABG: No results for input(s): PHART, PCO2ART, PO2ART, HCO3, O2SAT in the last 168 hours.  Liver Function Tests: No results for input(s): AST, ALT, ALKPHOS, BILITOT, PROT, ALBUMIN in the last 168 hours. No results for input(s): LIPASE, AMYLASE in the last 168 hours. No results for input(s): AMMONIA in the last 168 hours.  CBC: Recent Labs  Lab 01/27/21 0445 01/28/21 1135 01/30/21 0644 02/01/21 0633  WBC 9.5 12.8* 10.4 9.1  HGB 9.7* 10.6* 10.6* 9.7*  HCT 30.6* 32.6* 32.3* 31.5*  MCV 90.3 90.6 90.0 91.8  PLT 223  238 277 280    Cardiac Enzymes: No results for input(s): CKTOTAL, CKMB, CKMBINDEX, TROPONINI in the last 168 hours.  BNP (last 3 results) No results for input(s): BNP in the last 8760 hours.  ProBNP (last 3 results) No results for input(s): PROBNP in the last 8760 hours.  Radiological Exams: DG CHEST PORT 1 VIEW  Result Date: 01/31/2021 CLINICAL DATA:  Bronchitis EXAM: PORTABLE CHEST 1 VIEW COMPARISON:  Six days ago FINDINGS: Streaky, indistinct density in the bilateral chest. Stable heart size and mediastinal contours. Tracheostomy tube is well-positioned. IMPRESSION: Stable atelectasis and scarring.  No new abnormality Electronically Signed   By: Jorje Guild M.D.   On: 01/31/2021 10:22    Assessment/Plan Active Problems:   Acute on chronic respiratory failure with hypoxia (HCC)   Status epilepticus (HCC)   Polysubstance abuse (HCC)   Healthcare-associated pneumonia   Acute metabolic encephalopathy   Acute on chronic respiratory failure with hypoxia we will continue with T collar for now.  Patient is having a improved cough we will continue with secretion management supportive care Status epilepticus no sign of active seizures supportive care Polysubstance abuse Healthcare associated pneumonia treated with antibiotics Metabolic encephalopathy seems to be significantly better responds appropriately   I have personally seen and evaluated the patient, evaluated laboratory and imaging results, formulated the assessment and plan  and placed orders. The Patient requires high complexity decision making with multiple systems involvement.  Rounds were done with the Respiratory Therapy Director and Staff therapists and discussed with nursing staff also.  Allyne Gee, MD [facility] Pulmonary Critical Care Medicine Sleep Medicine

## 2021-02-02 ENCOUNTER — Other Ambulatory Visit (HOSPITAL_COMMUNITY): Payer: Medicare HMO

## 2021-02-02 IMAGING — DX DG ABDOMEN 1V
1 series · 1 of 1 positions shown · non-contrast
Comparison: [DATE]

CLINICAL DATA: Ileus, aspiration

EXAM:
ABDOMEN - 1 VIEW

[abdomen]
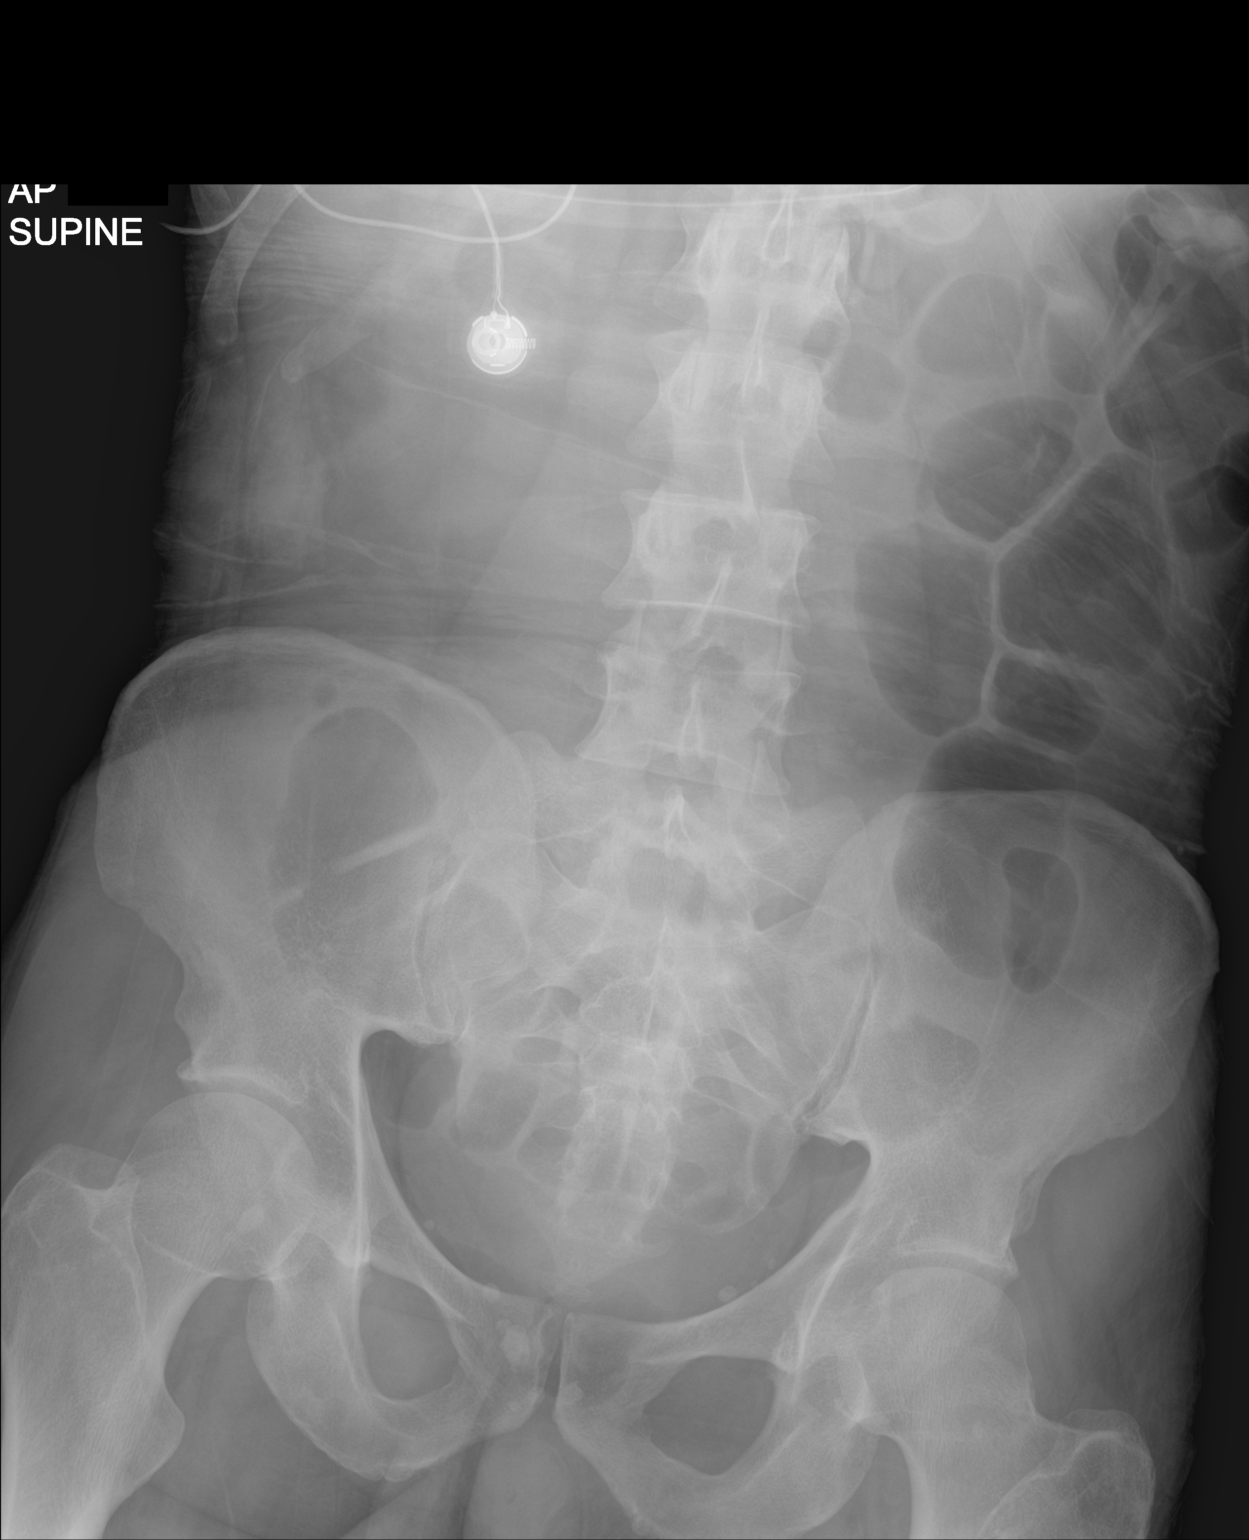

[1 of 1 positions shown; findings below may reference images not displayed]

FINDINGS: Supine frontal view of the abdomen and pelvis excludes the left
flank and hemidiaphragms by collimation. Percutaneous gastrostomy
tube overlies left upper quadrant unchanged. No bowel obstruction or
ileus. No masses or abnormal calcifications. No acute bony
abnormalities.
IMPRESSION: 1. Unremarkable bowel gas pattern.

## 2021-02-02 IMAGING — DX DG CHEST 1V PORT
1 series · 1 of 1 positions shown · non-contrast
Comparison: [DATE]

CLINICAL DATA: Aspiration

EXAM:
PORTABLE CHEST 1 VIEW

[chest]
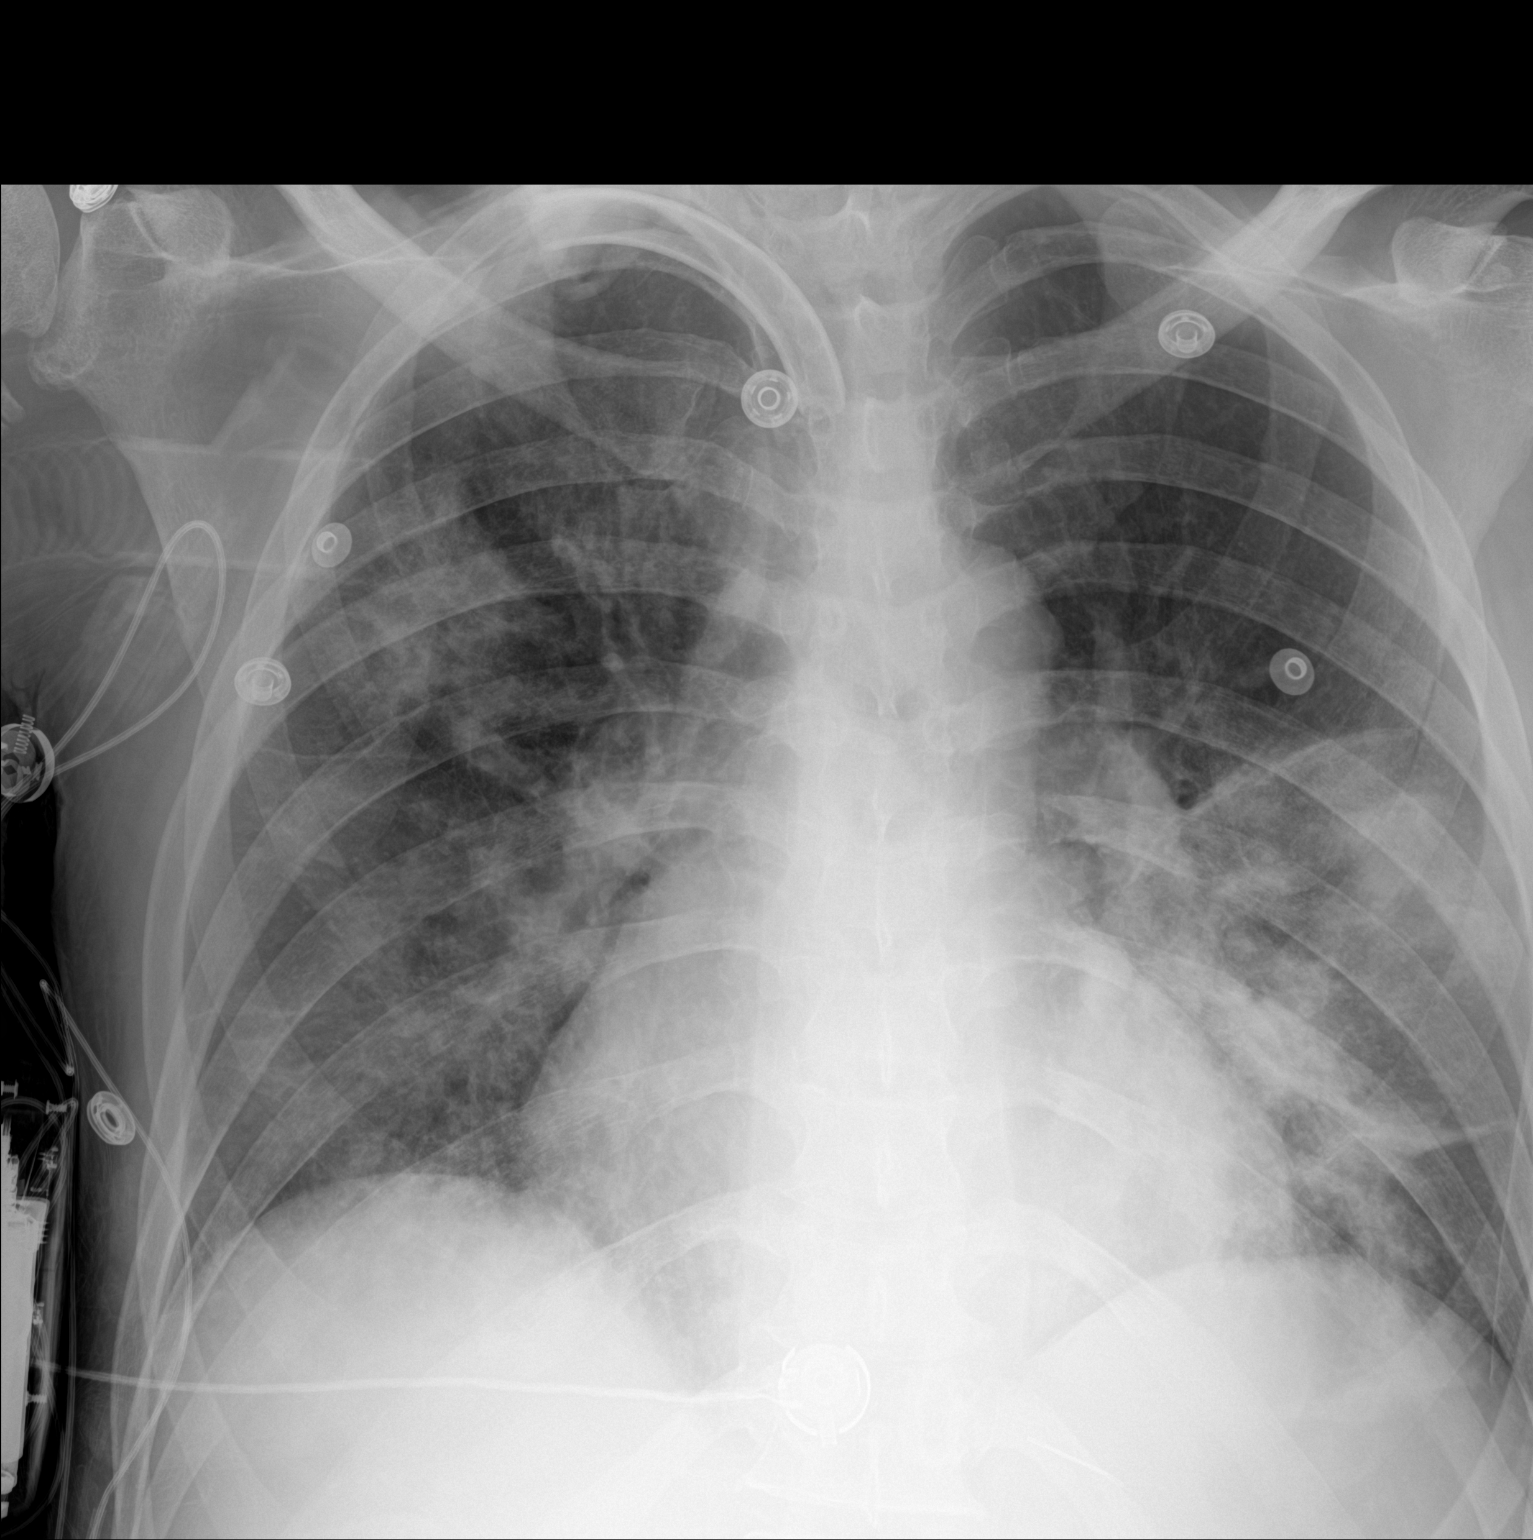

[1 of 1 positions shown; findings below may reference images not displayed]

FINDINGS: Single frontal view of the chest demonstrates stable tracheostomy
tube tip at thoracic inlet. Interval progression of multifocal
bilateral consolidation, greatest in the left lung base. No effusion
or pneumothorax. Cardiac silhouette is stable. No acute bony
abnormalities.
IMPRESSION: 1. Progressive multifocal bilateral airspace disease which may
reflect infection or asymmetric edema. Aspiration could be
considered in the appropriate clinical setting.

## 2021-02-02 NOTE — Progress Notes (Signed)
Pulmonary [place]  PROGRESS NOTE     Luis Hunter  FIE:[id number]  DOB: March 06, 1976   DOA: 01/16/2021  Referring Physician: Satira Sark, MD  HPI: Luis Hunter is a 45 y.o. male being followed for ventilator/airway/oxygen weaning Acute on Chronic Respiratory Failure.  Patient is comfortable right now without distress no fevers are noted has been doing much better with the secretions since starting on antibiotics  Medications: Reviewed on Rounds  Physical Exam:  Vitals: Temperature is 97.3 pulse 85 respiratory is 20 blood pressure 145/60 saturations 99%  Ventilator Settings on T collar off the ventilator  General: Comfortable at this time Neck: supple Cardiovascular: no malignant arrhythmias Respiratory: Few rhonchi no rales are noted at this time Skin: no rash seen on limited exam Musculoskeletal: No gross abnormality Psychiatric:unable to assess Neurologic:no involuntary movements         Lab Data:   Basic Metabolic Panel: Recent Labs  Lab 01/27/21 0445 01/28/21 1135 01/30/21 0644 01/31/21 0427 02/01/21 0633  NA 133* 138 135  --  140  K 3.5 3.6 3.2* 3.5 3.5  CL 100 99 100  --  103  CO2 28 29 30   --  31  GLUCOSE 101* 93 100*  --  105*  BUN 11 9 5*  --  9  CREATININE 0.59* 0.63 0.51*  --  0.59*  CALCIUM 8.8* 9.2 8.8*  --  9.0  MG  --  1.8  --   --  1.8    ABG: No results for input(s): PHART, PCO2ART, PO2ART, HCO3, O2SAT in the last 168 hours.  Liver Function Tests: No results for input(s): AST, ALT, ALKPHOS, BILITOT, PROT, ALBUMIN in the last 168 hours. No results for input(s): LIPASE, AMYLASE in the last 168 hours. No results for input(s): AMMONIA in the last 168 hours.  CBC: Recent Labs  Lab 01/27/21 0445 01/28/21 1135 01/30/21 0644 02/01/21 0633  WBC 9.5 12.8* 10.4 9.1  HGB 9.7* 10.6* 10.6* 9.7*  HCT 30.6* 32.6* 32.3* 31.5*  MCV 90.3 90.6 90.0 91.8  PLT 223  238 277 280    Cardiac Enzymes: No results for input(s): CKTOTAL, CKMB, CKMBINDEX, TROPONINI in the last 168 hours.  BNP (last 3 results) No results for input(s): BNP in the last 8760 hours.  ProBNP (last 3 results) No results for input(s): PROBNP in the last 8760 hours.  Radiological Exams: DG CHEST PORT 1 VIEW  Result Date: 01/31/2021 CLINICAL DATA:  Bronchitis EXAM: PORTABLE CHEST 1 VIEW COMPARISON:  Six days ago FINDINGS: Streaky, indistinct density in the bilateral chest. Stable heart size and mediastinal contours. Tracheostomy tube is well-positioned. IMPRESSION: Stable atelectasis and scarring.  No new abnormality Electronically Signed   By: Jorje Guild M.D.   On: 01/31/2021 10:22    Assessment/Plan Active Problems:   Acute on chronic respiratory failure with hypoxia (HCC)   Status epilepticus (HCC)   Polysubstance abuse (HCC)   Healthcare-associated pneumonia   Acute metabolic encephalopathy   Acute on chronic respiratory failure with hypoxia improvement in secretions so therefore would consider weaning and we will go ahead and start the patient on capping trials Status epilepticus no sign of active seizures Polysubstance abuse he is at baseline supportive care Healthcare associated pneumonia treated we will continue to follow along closely Metabolic encephalopathy no change we will continue to follow   I have personally seen and evaluated the patient, evaluated laboratory and imaging results, formulated the  assessment and plan and placed orders. The Patient requires high complexity decision making with multiple systems involvement.  Rounds were done with the Respiratory Therapy Director and Staff therapists and discussed with nursing staff also.  Allyne Gee, MD [facility] Pulmonary Critical Care Medicine Sleep Medicine

## 2021-02-03 DIAGNOSIS — J9621 Acute and chronic respiratory failure with hypoxia: Secondary | ICD-10-CM | POA: Diagnosis not present

## 2021-02-03 DIAGNOSIS — J189 Pneumonia, unspecified organism: Secondary | ICD-10-CM | POA: Diagnosis not present

## 2021-02-03 DIAGNOSIS — F1910 Other psychoactive substance abuse, uncomplicated: Secondary | ICD-10-CM | POA: Diagnosis not present

## 2021-02-03 DIAGNOSIS — G9341 Metabolic encephalopathy: Secondary | ICD-10-CM | POA: Diagnosis not present

## 2021-02-03 NOTE — Progress Notes (Signed)
Pulmonary [place]  PROGRESS NOTE     Luis Hunter  [id number]:[id number]  DOB: [birth year]   DOA: 01/16/2021  Referring Physician: Satira Sark, MD  HPI: Luis Hunter is a 45 y.o. male being followed for ventilator/airway/oxygen weaning Acute on Chronic Respiratory Failure.  Patient is resting comfortably right now without distress has been on T collar currently on 60% FiO2 apparently overnight major issues that occurred is possible aspiration chest x-ray now showing multifocal infiltrates  Medications: Reviewed on Rounds  Physical Exam:  Vitals: Temperature is 97.0 pulse 73 respiratory 18 blood pressure is 96/64 saturations 92%  Ventilator Settings currently on T collar requiring 60% FiO2  General: Comfortable at this time Neck: supple Cardiovascular: no malignant arrhythmias Respiratory: Coarse rhonchi noted bilaterally Skin: no rash seen on limited exam Musculoskeletal: No gross abnormality Psychiatric:unable to assess Neurologic:no involuntary movements         Lab Data:   Basic Metabolic Panel: Recent Labs  Lab 01/28/21 1135 01/30/21 0644 01/31/21 0427 02/01/21 0633  NA 138 135  --  140  K 3.6 3.2* 3.5 3.5  CL 99 100  --  103  CO2 29 30  --  31  GLUCOSE 93 100*  --  105*  BUN 9 5*  --  9  CREATININE 0.63 0.51*  --  0.59*  CALCIUM 9.2 8.8*  --  9.0  MG 1.8  --   --  1.8    ABG: No results for input(s): PHART, PCO2ART, PO2ART, HCO3, O2SAT in the last 168 hours.  Liver Function Tests: No results for input(s): AST, ALT, ALKPHOS, BILITOT, PROT, ALBUMIN in the last 168 hours. No results for input(s): LIPASE, AMYLASE in the last 168 hours. No results for input(s): AMMONIA in the last 168 hours.  CBC: Recent Labs  Lab 01/28/21 1135 01/30/21 0644 02/01/21 0633  WBC 12.8* 10.4 9.1  HGB 10.6* 10.6* 9.7*  HCT 32.6* 32.3* 31.5*  MCV 90.6 90.0 91.8  PLT 238 277 280     Cardiac Enzymes: No results for input(s): CKTOTAL, CKMB, CKMBINDEX, TROPONINI in the last 168 hours.  BNP (last 3 results) No results for input(s): BNP in the last 8760 hours.  ProBNP (last 3 results) No results for input(s): PROBNP in the last 8760 hours.  Radiological Exams: DG Abd 1 View  Result Date: 02/02/2021 CLINICAL DATA:  Ileus, aspiration EXAM: ABDOMEN - 1 VIEW COMPARISON:  01/23/2021 FINDINGS: Supine frontal view of the abdomen and pelvis excludes the left flank and hemidiaphragms by collimation. Percutaneous gastrostomy tube overlies left upper quadrant unchanged. No bowel obstruction or ileus. No masses or abnormal calcifications. No acute bony abnormalities. IMPRESSION: 1. Unremarkable bowel gas pattern. Electronically Signed   By: Randa Ngo M.D.   On: 02/02/2021 16:24   DG CHEST PORT 1 VIEW  Result Date: 02/02/2021 CLINICAL DATA:  Aspiration EXAM: PORTABLE CHEST 1 VIEW COMPARISON:  01/31/2021 FINDINGS: Single frontal view of the chest demonstrates stable tracheostomy tube tip at thoracic inlet. Interval progression of multifocal bilateral consolidation, greatest in the left lung base. No effusion or pneumothorax. Cardiac silhouette is stable. No acute bony abnormalities. IMPRESSION: 1. Progressive multifocal bilateral airspace disease which may reflect infection or asymmetric edema. Aspiration could be considered in the appropriate clinical setting. Electronically Signed   By: Randa Ngo M.D.   On: 02/02/2021 16:23    Assessment/Plan Active Problems:   Acute on chronic respiratory failure with hypoxia (HCC)  Status epilepticus ([place])   Polysubstance abuse ([place])   Healthcare-associated pneumonia   Acute metabolic encephalopathy   Acute on chronic respiratory failure with hypoxia patient had bout of aspiration apparently overnight oxygen requirements are higher now at 60% but saturations have been good so we will have respiratory try to titrate down  again Aspiration pneumonia Healthcare associated pneumonia new infiltrates which are somewhat more progressive we will need to monitor for worsening and development of potential ARDS Polysubstance abuse no change we will continue with supportive care Metabolic encephalopathy no change we will continue with present management Status epilepticus patient is at baseline   I have personally seen and evaluated the patient, evaluated laboratory and imaging results, formulated the assessment and plan and placed orders. The Patient requires high complexity decision making with multiple systems involvement.  Rounds were done with the Respiratory Therapy Director and Staff therapists and discussed with nursing staff also.  Allyne Gee, MD [facility] Pulmonary Critical Care Medicine Sleep Medicine

## 2021-02-04 ENCOUNTER — Other Ambulatory Visit (HOSPITAL_COMMUNITY): Payer: Medicare HMO

## 2021-02-04 LAB — BASIC METABOLIC PANEL
Anion gap: 7 (ref 5–15)
BUN: 10 mg/dL (ref 6–20)
CO2: 26 mmol/L (ref 22–32)
Calcium: 8.3 mg/dL — ABNORMAL LOW (ref 8.9–10.3)
Chloride: 102 mmol/L (ref 98–111)
Creatinine, Ser: 0.68 mg/dL (ref 0.61–1.24)
GFR, Estimated: >60 mL/min (ref >60)
Glucose, Bld: 153 mg/dL — ABNORMAL HIGH (ref 70–99)
Potassium: 3.8 mmol/L (ref 3.5–5.1)
Sodium: 135 mmol/L (ref 135–145)

## 2021-02-04 LAB — CBC
HCT: 28.3 % — ABNORMAL LOW (ref 39.0–52.0)
Hemoglobin: 9.3 g/dL — ABNORMAL LOW (ref 13.0–17.0)
MCH: 29 pg (ref 26.0–34.0)
MCHC: 32.9 g/dL (ref 30.0–36.0)
MCV: 88.2 fL (ref 80.0–100.0)
Platelets: 250 10*3/uL (ref 150–400)
RBC: 3.21 MIL/uL — ABNORMAL LOW (ref 4.22–5.81)
RDW: 16 % — ABNORMAL HIGH (ref 11.5–15.5)
WBC: 17.4 10*3/uL — ABNORMAL HIGH (ref 4.0–10.5)
nRBC: 0 % (ref 0.0–0.2)

## 2021-02-04 IMAGING — DX DG CHEST 1V PORT
1 series · 1 of 1 positions shown · non-contrast
Comparison: Chest radiograph dated [DATE].

CLINICAL DATA: Status post peripherally inserted central venous
catheter (PICC) placement.

EXAM:
PORTABLE CHEST 1 VIEW

[chest ap]
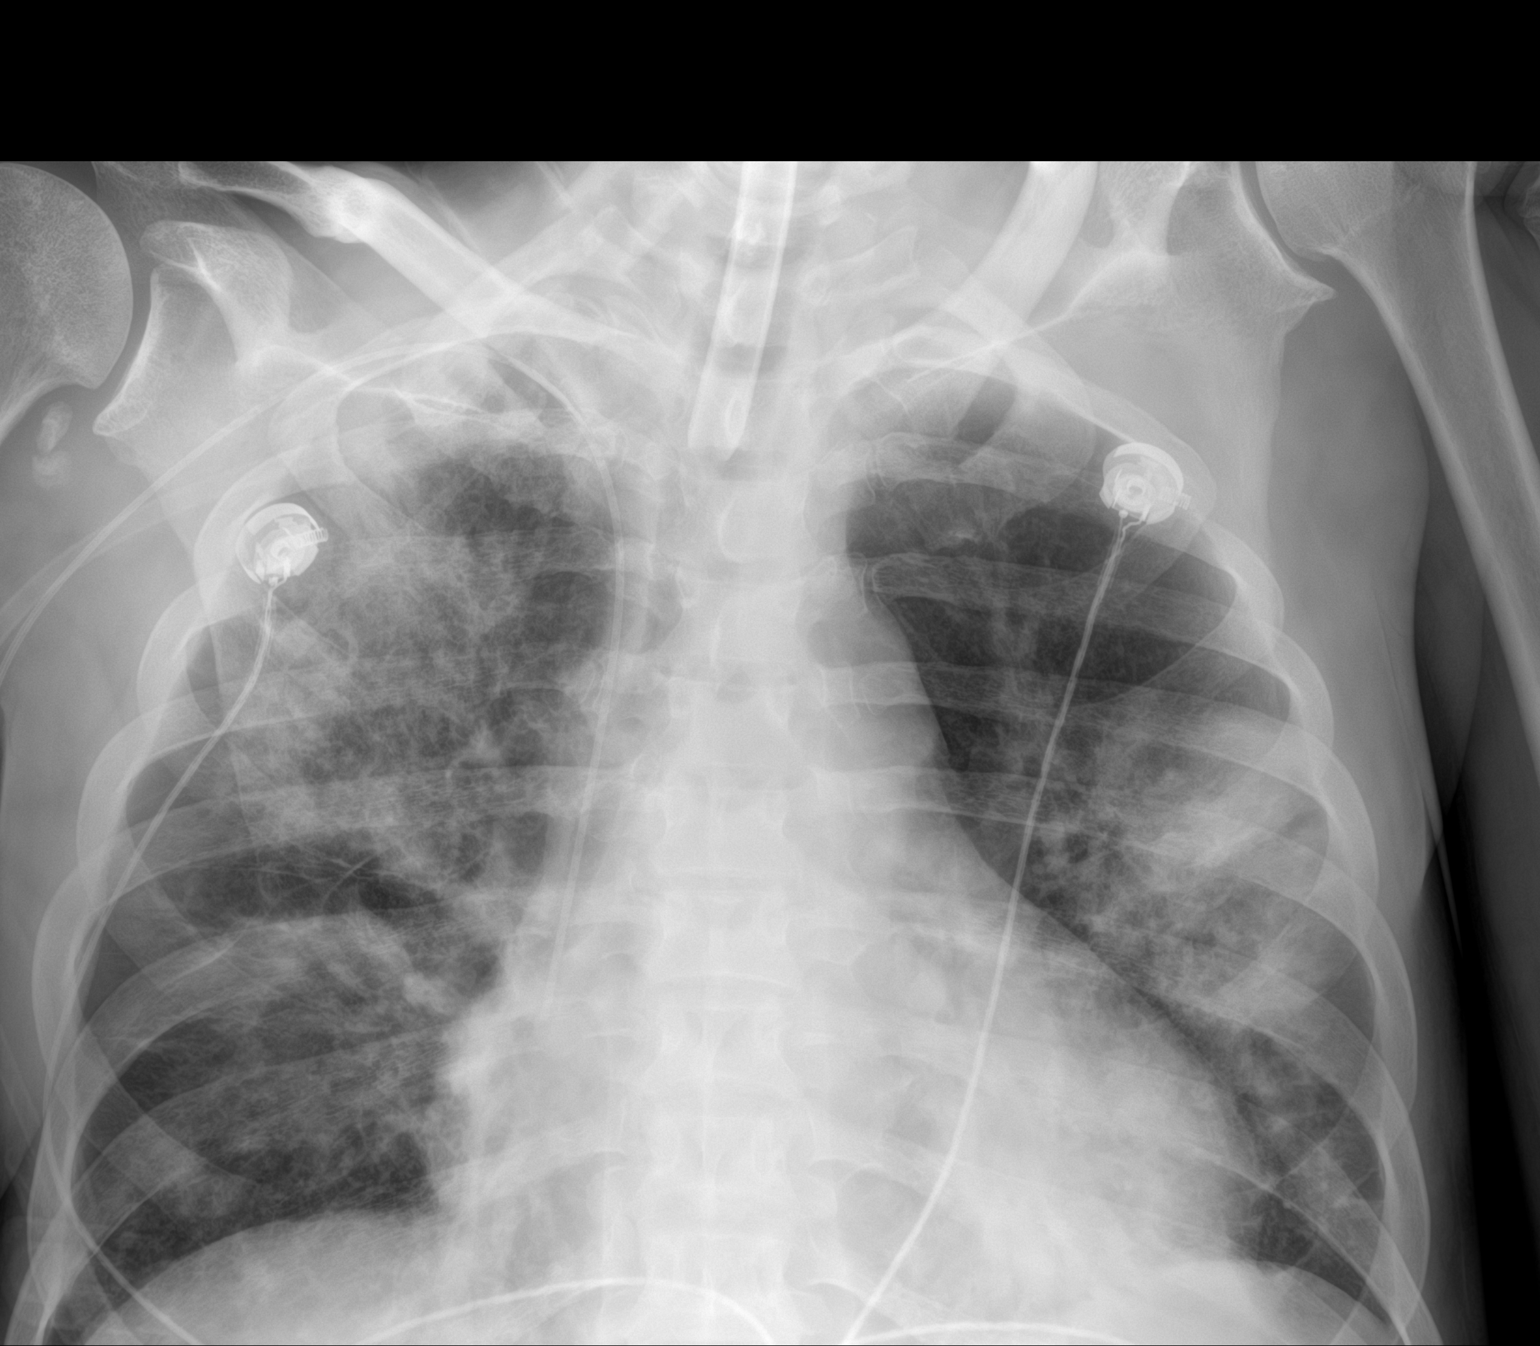

[1 of 1 positions shown; findings below may reference images not displayed]

FINDINGS: A right upper extremity PICC tip overlies the superior cavoatrial
junction. A tracheostomy tube terminates in the upper thoracic
trachea. The heart size is unchanged, mildly enlarged. Moderate
patchy bilateral airspace opacities appear slightly increased. No
pleural effusion or pneumothorax.
IMPRESSION: Right upper extremity PICC tip overlying the superior cavoatrial
junction. No pneumothorax.

## 2021-02-05 ENCOUNTER — Other Ambulatory Visit (HOSPITAL_COMMUNITY): Payer: Medicare HMO

## 2021-02-05 NOTE — Progress Notes (Signed)
Pulmonary [place]  PROGRESS NOTE     Luis Hunter  [id number]:[id number]  DOB: 03/12/1976   DOA: 01/16/2021  Referring Physician: Satira Sark, MD  HPI: Luis Hunter is a 45 y.o. male being followed for ventilator/airway/oxygen weaning Acute on Chronic Respiratory Failure.  Patient is on T collar currently on 40% FiO2 with good saturations are noted.  Secretions are moderate  Medications: Reviewed on Rounds  Physical Exam:  Vitals: Temperature is 98.4 pulse 99 respiratory is 18 blood pressure is 168/85 saturations 100  Ventilator Settings on T collar with an FiO2 of 40%  General: Comfortable at this time Neck: supple Cardiovascular: no malignant arrhythmias Respiratory: Scattered rhonchi expansion is equal Skin: no rash seen on limited exam Musculoskeletal: No gross abnormality Psychiatric:unable to assess Neurologic:no involuntary movements         Lab Data:   Basic Metabolic Panel: Recent Labs  Lab 01/30/21 0644 01/31/21 0427 02/01/21 0633 02/04/21 0458  NA 135  --  140 135  K 3.2* 3.5 3.5 3.8  CL 100  --  103 102  CO2 30  --  31 26  GLUCOSE 100*  --  105* 153*  BUN 5*  --  9 10  CREATININE 0.51*  --  0.59* 0.68  CALCIUM 8.8*  --  9.0 8.3*  MG  --   --  1.8  --     ABG: No results for input(s): PHART, PCO2ART, PO2ART, HCO3, O2SAT in the last 168 hours.  Liver Function Tests: No results for input(s): AST, ALT, ALKPHOS, BILITOT, PROT, ALBUMIN in the last 168 hours. No results for input(s): LIPASE, AMYLASE in the last 168 hours. No results for input(s): AMMONIA in the last 168 hours.  CBC: Recent Labs  Lab 01/30/21 0644 02/01/21 0633 02/04/21 0458  WBC 10.4 9.1 17.4*  HGB 10.6* 9.7* 9.3*  HCT 32.3* 31.5* 28.3*  MCV 90.0 91.8 88.2  PLT 277 280 250    Cardiac Enzymes: No results for input(s): CKTOTAL, CKMB, CKMBINDEX, TROPONINI in the last 168 hours.  BNP (last 3  results) No results for input(s): BNP in the last 8760 hours.  ProBNP (last 3 results) No results for input(s): PROBNP in the last 8760 hours.  Radiological Exams: DG Chest Port 1 View  Result Date: 02/05/2021 CLINICAL DATA:  Follow-up pneumonia. EXAM: PORTABLE CHEST 1 VIEW COMPARISON:  02/04/2021 and earlier studies. FINDINGS: Bilateral airspace lung opacities are without significant change from the previous day's study allowing for differences in patient positioning and technique. No new lung abnormalities. No convincing pleural effusion and no pneumothorax. Tracheostomy tube and right sided PICC are stable in well positioned. IMPRESSION: 1. No significant change from the previous day's study. 2. Persistent bilateral airspace lung opacities consistent with multifocal pneumonia. Electronically Signed   By: Lajean Manes M.D.   On: 02/05/2021 14:23   DG CHEST PORT 1 VIEW  Result Date: 02/04/2021 CLINICAL DATA:  Status post peripherally inserted central venous catheter (PICC) placement. EXAM: PORTABLE CHEST 1 VIEW COMPARISON:  Chest radiograph dated 02/02/2021. FINDINGS: A right upper extremity PICC tip overlies the superior cavoatrial junction. A tracheostomy tube terminates in the upper thoracic trachea. The heart size is unchanged, mildly enlarged. Moderate patchy bilateral airspace opacities appear slightly increased. No pleural effusion or pneumothorax. IMPRESSION: Right upper extremity PICC tip overlying the superior cavoatrial junction. No pneumothorax. Electronically Signed   By: Zerita Boers M.D.   On: 02/04/2021  18:02    Assessment/Plan Active Problems:   Acute on chronic respiratory failure with hypoxia (HCC)   Status epilepticus (HCC)   Polysubstance abuse ([place])   Healthcare-associated pneumonia   Acute metabolic encephalopathy   Acute on chronic respiratory failure with hypoxia patient currently is 40% FiO2 secretions are fair to moderate continue with aggressive pulmonary  toilet Status epilepticus no change we will continue to follow along closely Polysubstance abuse at baseline Healthcare associated pneumonia treated Metabolic encephalopathy at baseline we will continue to follow along   I have personally seen and evaluated the patient, evaluated laboratory and imaging results, formulated the assessment and plan and placed orders. The Patient requires high complexity decision making with multiple systems involvement.  Rounds were done with the Respiratory Therapy Director and Staff therapists and discussed with nursing staff also.  Allyne Gee, MD [facility] Pulmonary Critical Care Medicine Sleep Medicine

## 2021-02-06 LAB — BASIC METABOLIC PANEL
Anion gap: 6 (ref 5–15)
BUN: 10 mg/dL (ref 6–20)
CO2: 25 mmol/L (ref 22–32)
Calcium: 7.7 mg/dL — ABNORMAL LOW (ref 8.9–10.3)
Chloride: 105 mmol/L (ref 98–111)
Creatinine, Ser: 0.59 mg/dL — ABNORMAL LOW (ref 0.61–1.24)
GFR, Estimated: >60 mL/min (ref >60)
Glucose, Bld: 105 mg/dL — ABNORMAL HIGH (ref 70–99)
Potassium: 3.3 mmol/L — ABNORMAL LOW (ref 3.5–5.1)
Sodium: 136 mmol/L (ref 135–145)

## 2021-02-06 LAB — LACTIC ACID, PLASMA
Lactic Acid, Venous: 1 mmol/L (ref 0.5–1.9)
Lactic Acid, Venous: 0.9 mmol/L (ref 0.5–1.9)

## 2021-02-06 LAB — HEPATIC FUNCTION PANEL
ALT: 24 U/L (ref 0–44)
AST: 18 U/L (ref 15–41)
Albumin: 2.3 g/dL — ABNORMAL LOW (ref 3.5–5.0)
Alkaline Phosphatase: 92 U/L (ref 38–126)
Bilirubin, Direct: 0.1 mg/dL (ref 0.0–0.2)
Indirect Bilirubin: 0.8 mg/dL (ref 0.3–0.9)
Total Bilirubin: 0.9 mg/dL (ref 0.3–1.2)
Total Protein: 6.6 g/dL (ref 6.5–8.1)

## 2021-02-06 LAB — CBC
HCT: 31 % — ABNORMAL LOW (ref 39.0–52.0)
HCT: 26.7 % — ABNORMAL LOW (ref 39.0–52.0)
Hemoglobin: 9.8 g/dL — ABNORMAL LOW (ref 13.0–17.0)
Hemoglobin: 8.3 g/dL — ABNORMAL LOW (ref 13.0–17.0)
MCH: 28.2 pg (ref 26.0–34.0)
MCH: 27.8 pg (ref 26.0–34.0)
MCHC: 31.6 g/dL (ref 30.0–36.0)
MCHC: 31.1 g/dL (ref 30.0–36.0)
MCV: 89.3 fL (ref 80.0–100.0)
MCV: 89.3 fL (ref 80.0–100.0)
Platelets: 292 10*3/uL (ref 150–400)
Platelets: 260 10*3/uL (ref 150–400)
RBC: 3.47 MIL/uL — ABNORMAL LOW (ref 4.22–5.81)
RBC: 2.99 MIL/uL — ABNORMAL LOW (ref 4.22–5.81)
RDW: 16 % — ABNORMAL HIGH (ref 11.5–15.5)
RDW: 16 % — ABNORMAL HIGH (ref 11.5–15.5)
WBC: 16 10*3/uL — ABNORMAL HIGH (ref 4.0–10.5)
WBC: 17.1 10*3/uL — ABNORMAL HIGH (ref 4.0–10.5)
nRBC: 0 % (ref 0.0–0.2)
nRBC: 0 % (ref 0.0–0.2)

## 2021-02-06 LAB — AMMONIA: Ammonia: 17 umol/L (ref 9–35)

## 2021-02-06 LAB — PHOSPHORUS: Phosphorus: 2.8 mg/dL (ref 2.5–4.6)

## 2021-02-06 NOTE — Progress Notes (Signed)
Pulmonary [place]  PROGRESS NOTE     [name]  VQM:[id number]  DOB: 08/07/1975   DOA: 01/16/2021  Referring Physician: Satira Sark, MD  HPI: Luis Hunter is a 45 y.o. male being followed for ventilator/airway/oxygen weaning Acute on Chronic Respiratory Failure.  Patient is comfortable right now without distress has been having low-grade temperature noted still with copious secretions  Medications: Reviewed on Rounds  Physical Exam:  Vitals: Temperature is 99.9 pulse 102 respiratory 22 blood pressure is 111/72 saturations 93%  Ventilator Settings on T collar with an FiO2 of 30%  General: Comfortable at this time Neck: supple Cardiovascular: no malignant arrhythmias Respiratory: Coarse rhonchi expansion is equal Skin: no rash seen on limited exam Musculoskeletal: No gross abnormality Psychiatric:unable to assess Neurologic:no involuntary movements         Lab Data:   Basic Metabolic Panel: Recent Labs  Lab 01/31/21 0427 02/01/21 0633 02/04/21 0458 02/06/21 0337  NA  --  140 135  --   K 3.5 3.5 3.8  --   CL  --  103 102  --   CO2  --  31 26  --   GLUCOSE  --  105* 153*  --   BUN  --  9 10  --   CREATININE  --  0.59* 0.68  --   CALCIUM  --  9.0 8.3*  --   MG  --  1.8  --   --   PHOS  --   --   --  2.8    ABG: No results for input(s): PHART, PCO2ART, PO2ART, HCO3, O2SAT in the last 168 hours.  Liver Function Tests: Recent Labs  Lab 02/06/21 0337  AST 18  ALT 24  ALKPHOS 92  BILITOT 0.9  PROT 6.6  ALBUMIN 2.3*   No results for input(s): LIPASE, AMYLASE in the last 168 hours. Recent Labs  Lab 02/06/21 0337  AMMONIA 17    CBC: Recent Labs  Lab 02/01/21 0633 02/04/21 0458 02/06/21 0337  WBC 9.1 17.4* 16.0*  HGB 9.7* 9.3* 9.8*  HCT 31.5* 28.3* 31.0*  MCV 91.8 88.2 89.3  PLT 280 250 292    Cardiac Enzymes: No results for input(s): CKTOTAL, CKMB,  CKMBINDEX, TROPONINI in the last 168 hours.  BNP (last 3 results) No results for input(s): BNP in the last 8760 hours.  ProBNP (last 3 results) No results for input(s): PROBNP in the last 8760 hours.  Radiological Exams: DG Chest Port 1 View  Result Date: 02/05/2021 CLINICAL DATA:  Follow-up pneumonia. EXAM: PORTABLE CHEST 1 VIEW COMPARISON:  02/04/2021 and earlier studies. FINDINGS: Bilateral airspace lung opacities are without significant change from the previous day's study allowing for differences in patient positioning and technique. No new lung abnormalities. No convincing pleural effusion and no pneumothorax. Tracheostomy tube and right sided PICC are stable in well positioned. IMPRESSION: 1. No significant change from the previous day's study. 2. Persistent bilateral airspace lung opacities consistent with multifocal pneumonia. Electronically Signed   By: Lajean Manes M.D.   On: 02/05/2021 14:23   DG CHEST PORT 1 VIEW  Result Date: 02/04/2021 CLINICAL DATA:  Status post peripherally inserted central venous catheter (PICC) placement. EXAM: PORTABLE CHEST 1 VIEW COMPARISON:  Chest radiograph dated 02/02/2021. FINDINGS: A right upper extremity PICC tip overlies the superior cavoatrial junction. A tracheostomy tube terminates in the upper thoracic trachea. The heart size is unchanged, mildly enlarged. Moderate  patchy bilateral airspace opacities appear slightly increased. No pleural effusion or pneumothorax. IMPRESSION: Right upper extremity PICC tip overlying the superior cavoatrial junction. No pneumothorax. Electronically Signed   By: Zerita Boers M.D.   On: 02/04/2021 18:02    Assessment/Plan Active Problems:   Acute on chronic respiratory failure with hypoxia (HCC)   Status epilepticus (HCC)   Polysubstance abuse ([place])   Healthcare-associated pneumonia   Acute metabolic encephalopathy   Acute on chronic respiratory failure with hypoxia patient is on T collar [place] on 30% FiO2  secretions are copious Status epilepticus no change we will continue with supportive care no active seizure noted Polysubstance abuse patient is at baseline right now Healthcare associated pneumonia treated Acute metabolic encephalopathy no change we will continue with supportive care   I have personally seen and evaluated the patient, evaluated laboratory and imaging results, formulated the assessment and plan and placed orders. The Patient requires high complexity decision making with multiple systems involvement.  Rounds were done with the Respiratory Therapy Director and Staff therapists and discussed with nursing staff also.  Allyne Gee, MD [facility] Pulmonary Critical Care Medicine Sleep Medicine

## 2021-02-07 LAB — CBC
HCT: 30.4 % — ABNORMAL LOW (ref 39.0–52.0)
Hemoglobin: 9.7 g/dL — ABNORMAL LOW (ref 13.0–17.0)
MCH: 28.1 pg (ref 26.0–34.0)
MCHC: 31.9 g/dL (ref 30.0–36.0)
MCV: 88.1 fL (ref 80.0–100.0)
Platelets: 287 10*3/uL (ref 150–400)
RBC: 3.45 MIL/uL — ABNORMAL LOW (ref 4.22–5.81)
RDW: 16.1 % — ABNORMAL HIGH (ref 11.5–15.5)
WBC: 17.2 10*3/uL — ABNORMAL HIGH (ref 4.0–10.5)
nRBC: 0 % (ref 0.0–0.2)

## 2021-02-07 LAB — BASIC METABOLIC PANEL
Anion gap: 8 (ref 5–15)
BUN: 10 mg/dL (ref 6–20)
CO2: 24 mmol/L (ref 22–32)
Calcium: 8.4 mg/dL — ABNORMAL LOW (ref 8.9–10.3)
Chloride: 106 mmol/L (ref 98–111)
Creatinine, Ser: 0.54 mg/dL — ABNORMAL LOW (ref 0.61–1.24)
GFR, Estimated: >60 mL/min (ref >60)
Glucose, Bld: 99 mg/dL (ref 70–99)
Potassium: 3.6 mmol/L (ref 3.5–5.1)
Sodium: 138 mmol/L (ref 135–145)

## 2021-02-07 NOTE — Progress Notes (Signed)
Pulmonary [place]  PROGRESS NOTE     Luis Hunter  [id number]:[id number]  DOB: 27-Sep-1975   DOA: 01/16/2021  Referring Physician: Satira Sark, MD  HPI: Luis Hunter is a 45 y.o. male being followed for ventilator/airway/oxygen weaning Acute on Chronic Respiratory Failure.  Right now is comfortable without distress did have a low-grade fever with apparently pulled his trach out yesterday was reinserted without difficulty  Medications: Reviewed on Rounds  Physical Exam:  Vitals: Temperature is 99.0 pulse 86 respiratory rate is 20 blood pressure is 112/70 saturations 100%  Ventilator Settings and off the ventilator on T collar  General: Comfortable at this time Neck: supple Cardiovascular: no malignant arrhythmias Respiratory: Coarse breath sounds with few scattered rhonchi Skin: no rash seen on limited exam Musculoskeletal: No gross abnormality Psychiatric:unable to assess Neurologic:no involuntary movements         Lab Data:   Basic Metabolic Panel: Recent Labs  Lab 02/01/21 0633 02/04/21 0458 02/06/21 0337 02/06/21 1515 02/07/21 0454  NA 140 135  --  136 138  K 3.5 3.8  --  3.3* 3.6  CL 103 102  --  105 106  CO2 31 26  --  25 24  GLUCOSE 105* 153*  --  105* 99  BUN 9 10  --  10 10  CREATININE 0.59* 0.68  --  0.59* 0.54*  CALCIUM 9.0 8.3*  --  7.7* 8.4*  MG 1.8  --   --   --   --   PHOS  --   --  2.8  --   --     ABG: No results for input(s): PHART, PCO2ART, PO2ART, HCO3, O2SAT in the last 168 hours.  Liver Function Tests: Recent Labs  Lab 02/06/21 0337  AST 18  ALT 24  ALKPHOS 92  BILITOT 0.9  PROT 6.6  ALBUMIN 2.3*   No results for input(s): LIPASE, AMYLASE in the last 168 hours. Recent Labs  Lab 02/06/21 0337  AMMONIA 17    CBC: Recent Labs  Lab 02/01/21 0633 02/04/21 0458 02/06/21 0337 02/06/21 1515 02/07/21 0454  WBC 9.1 17.4* 16.0* 17.1* 17.2*  HGB  9.7* 9.3* 9.8* 8.3* 9.7*  HCT 31.5* 28.3* 31.0* 26.7* 30.4*  MCV 91.8 88.2 89.3 89.3 88.1  PLT 280 250 292 260 287    Cardiac Enzymes: No results for input(s): CKTOTAL, CKMB, CKMBINDEX, TROPONINI in the last 168 hours.  BNP (last 3 results) No results for input(s): BNP in the last 8760 hours.  ProBNP (last 3 results) No results for input(s): PROBNP in the last 8760 hours.  Radiological Exams: DG Chest Port 1 View  Result Date: 02/05/2021 CLINICAL DATA:  Follow-up pneumonia. EXAM: PORTABLE CHEST 1 VIEW COMPARISON:  02/04/2021 and earlier studies. FINDINGS: Bilateral airspace lung opacities are without significant change from the previous day's study allowing for differences in patient positioning and technique. No new lung abnormalities. No convincing pleural effusion and no pneumothorax. Tracheostomy tube and right sided PICC are stable in well positioned. IMPRESSION: 1. No significant change from the previous day's study. 2. Persistent bilateral airspace lung opacities consistent with multifocal pneumonia. Electronically Signed   By: Lajean Manes M.D.   On: 02/05/2021 14:23    Assessment/Plan Active Problems:   Acute on chronic respiratory failure with hypoxia (HCC)   Status epilepticus (HCC)   Polysubstance abuse ([place])   Healthcare-associated pneumonia   Acute metabolic encephalopathy   Acute on  chronic respiratory failure with hypoxia we will continue with T collar titrate oxygen as tolerated continue pulmonary toilet per RT Status epilepticus no change continue to monitor closely. Polysubstance abuse patient is at baseline Healthcare associated pneumonia treated Metabolic encephalopathy no change we will continue to follow along   I have personally seen and evaluated the patient, evaluated laboratory and imaging results, formulated the assessment and plan and placed orders. The Patient requires high complexity decision making with multiple systems involvement.  Rounds were  done with the Respiratory Therapy Director and Staff therapists and discussed with nursing staff also.  Allyne Gee, MD [facility] Pulmonary Critical Care Medicine Sleep Medicine

## 2021-02-08 DIAGNOSIS — F1910 Other psychoactive substance abuse, uncomplicated: Secondary | ICD-10-CM | POA: Diagnosis not present

## 2021-02-08 DIAGNOSIS — J189 Pneumonia, unspecified organism: Secondary | ICD-10-CM | POA: Diagnosis not present

## 2021-02-08 DIAGNOSIS — J9621 Acute and chronic respiratory failure with hypoxia: Secondary | ICD-10-CM | POA: Diagnosis not present

## 2021-02-08 DIAGNOSIS — G9341 Metabolic encephalopathy: Secondary | ICD-10-CM | POA: Diagnosis not present

## 2021-02-08 NOTE — Progress Notes (Signed)
Pulmonary [place]  PROGRESS NOTE     Luis Hunter  CWC:[id number]  DOB: Jul 30, 1975   DOA: 01/16/2021  Referring Physician: Satira Sark, MD  HPI: Luis Hunter is a 45 y.o. male being followed for ventilator/airway/oxygen weaning Acute on Chronic Respiratory Failure.  Comfortable right now without distress has been on 30% T collar secretions are still copious  Medications: Reviewed on Rounds  Physical Exam:  Vitals: Temperature is 98.4 pulse 83 respiratory 22 blood pressure is 112/74 saturations 100%  Ventilator Settings currently is off the ventilator on T collar  General: Comfortable at this time Neck: supple Cardiovascular: no malignant arrhythmias Respiratory: Scattered rhonchi expansion is equal Skin: no rash seen on limited exam Musculoskeletal: No gross abnormality Psychiatric:unable to assess Neurologic:no involuntary movements         Lab Data:   Basic Metabolic Panel: Recent Labs  Lab 02/04/21 0458 02/06/21 0337 02/06/21 1515 02/07/21 0454  NA 135  --  136 138  K 3.8  --  3.3* 3.6  CL 102  --  105 106  CO2 26  --  25 24  GLUCOSE 153*  --  105* 99  BUN 10  --  10 10  CREATININE 0.68  --  0.59* 0.54*  CALCIUM 8.3*  --  7.7* 8.4*  PHOS  --  2.8  --   --     ABG: No results for input(s): PHART, PCO2ART, PO2ART, HCO3, O2SAT in the last 168 hours.  Liver Function Tests: Recent Labs  Lab 02/06/21 0337  AST 18  ALT 24  ALKPHOS 92  BILITOT 0.9  PROT 6.6  ALBUMIN 2.3*   No results for input(s): LIPASE, AMYLASE in the last 168 hours. Recent Labs  Lab 02/06/21 0337  AMMONIA 17    CBC: Recent Labs  Lab 02/04/21 0458 02/06/21 0337 02/06/21 1515 02/07/21 0454  WBC 17.4* 16.0* 17.1* 17.2*  HGB 9.3* 9.8* 8.3* 9.7*  HCT 28.3* 31.0* 26.7* 30.4*  MCV 88.2 89.3 89.3 88.1  PLT 250 292 260 287    Cardiac Enzymes: No results for input(s): CKTOTAL, CKMB,  CKMBINDEX, TROPONINI in the last 168 hours.  BNP (last 3 results) No results for input(s): BNP in the last 8760 hours.  ProBNP (last 3 results) No results for input(s): PROBNP in the last 8760 hours.  Radiological Exams: No results found.  Assessment/Plan Active Problems:   Acute on chronic respiratory failure with hypoxia (HCC)   Status epilepticus (HCC)   Polysubstance abuse ([place])   Healthcare-associated pneumonia   Acute metabolic encephalopathy   Acute on chronic respiratory failure hypoxia continue with the T-piece right now secretions are limiting factor as far as being able to continue to wean Status epilepticus no active seizure noted at this time we will monitor closely Healthcare associated pneumonia has been treated slow improvement Metabolic encephalopathy no change Polysubstance abuse no active withdrawal   I have personally seen and evaluated the patient, evaluated laboratory and imaging results, formulated the assessment and plan and placed orders. The Patient requires high complexity decision making with multiple systems involvement.  Rounds were done with the Respiratory Therapy Director and Staff therapists and discussed with nursing staff also.  Allyne Gee, MD [facility] Pulmonary Critical Care Medicine Sleep Medicine

## 2021-02-09 NOTE — Progress Notes (Signed)
Pulmonary [place]  PROGRESS NOTE     Luis Hunter  VQM:[id number]  DOB: 1976/01/04   DOA: 01/16/2021  Referring Physician: Satira Sark, MD  HPI: Luis Hunter is a 45 y.o. male being followed for ventilator/airway/oxygen weaning Acute on Chronic Respiratory Failure.  Patient is resting comfortably right now without distress has been afebrile remains on T-piece has been on 30% FiO2  Medications: Reviewed on Rounds  Physical Exam:  Vitals: Temperature is 98.8 pulse 88 respiratory rate is 20 blood pressure is 127/65 saturations 100%  Ventilator Settings on T collar FiO2 of 30%  General: Comfortable at this time Neck: supple Cardiovascular: no malignant arrhythmias Respiratory: Scattered rhonchi expansion is equal Skin: no rash seen on limited exam Musculoskeletal: No gross abnormality Psychiatric:unable to assess Neurologic:no involuntary movements         Lab Data:   Basic Metabolic Panel: Recent Labs  Lab 02/04/21 0458 02/06/21 0337 02/06/21 1515 02/07/21 0454  NA 135  --  136 138  K 3.8  --  3.3* 3.6  CL 102  --  105 106  CO2 26  --  25 24  GLUCOSE 153*  --  105* 99  BUN 10  --  10 10  CREATININE 0.68  --  0.59* 0.54*  CALCIUM 8.3*  --  7.7* 8.4*  PHOS  --  2.8  --   --     ABG: No results for input(s): PHART, PCO2ART, PO2ART, HCO3, O2SAT in the last 168 hours.  Liver Function Tests: Recent Labs  Lab 02/06/21 0337  AST 18  ALT 24  ALKPHOS 92  BILITOT 0.9  PROT 6.6  ALBUMIN 2.3*   No results for input(s): LIPASE, AMYLASE in the last 168 hours. Recent Labs  Lab 02/06/21 0337  AMMONIA 17    CBC: Recent Labs  Lab 02/04/21 0458 02/06/21 0337 02/06/21 1515 02/07/21 0454  WBC 17.4* 16.0* 17.1* 17.2*  HGB 9.3* 9.8* 8.3* 9.7*  HCT 28.3* 31.0* 26.7* 30.4*  MCV 88.2 89.3 89.3 88.1  PLT 250 292 260 287    Cardiac Enzymes: No results for input(s): CKTOTAL,  CKMB, CKMBINDEX, TROPONINI in the last 168 hours.  BNP (last 3 results) No results for input(s): BNP in the last 8760 hours.  ProBNP (last 3 results) No results for input(s): PROBNP in the last 8760 hours.  Radiological Exams: No results found.  Assessment/Plan Active Problems:   Acute on chronic respiratory failure with hypoxia (HCC)   Status epilepticus (HCC)   Polysubstance abuse (HCC)   Healthcare-associated pneumonia   Acute metabolic encephalopathy   Acute on chronic respiratory failure hypoxia on T collar on 30% FiO2 secretions are still copious Status epilepticus no active seizure noted at this time we will continue to monitor closely Polysubstance abuse patient is at baseline Healthcare associated pneumonia treated Metabolic encephalopathy no change we will continue to follow along   I have personally seen and evaluated the patient, evaluated laboratory and imaging results, formulated the assessment and plan and placed orders. The Patient requires high complexity decision making with multiple systems involvement.  Rounds were done with the Respiratory Therapy Director and Staff therapists and discussed with nursing staff also.  Allyne Gee, MD [facility] Pulmonary Critical Care Medicine Sleep Medicine

## 2021-02-09 NOTE — Consult Note (Signed)
Infectious Disease Consultation   Luis Hunter  [id number]  DOB: Oct 01, 1975  DOA: 01/16/2021  Requesting physician: Dr. Owens Shark  Reason for consultation: Antibiotic recommendations   History of Present Illness: Luis Hunter is an 45 y.o. male who was admitted to [facility] on 12/16/2020 as a transfer from [facility]. He initially presented to [facility] after he was found unresponsive after having multiple seizures and intubated for airway protection and loaded with Keppra.  UDS at the time of admission was positive for cocaine and tetrahydrocannabinol needs.  Given the concern for status epilepticus patient was transferred from [facility] to [facility] for continuous EEG.  He had an abnormal EKG and underwent left heart catheterization but this was felt to be due to severe LVH.  Hospital course also complicated by pneumonia for which she was treated with antibiotics.  He also was noted to have recurrent fevers.  Per records from the acute facility he was unable to be weaned from the ventilator and underwent tracheostomy placement on 01/01/2021.  Post tracheostomy he was able to tolerate PSV and eventually trach collar.  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  He was on Vimpat and hydrochlorothiazide which were stopped with subsequent improvement in the eosinophilia.  On 01/16/2021 he underwent PEG tube placement.  Due to his complex medical problems he was transferred and admitted to [facility].  After presentation here he was noted to have copious thick secretions.  Also has leukocytosis.  Respiratory cultures from here showing Serratia marcescens, abundant diphtheroids.  He was treated with ceftriaxone but continued to have increased secretions therefore now switched to ciprofloxacin.  Also on Bactrim.  Review of Systems:  Review of systems negative except as mentioned above in the HPI.  Past Medical History: Past Medical  History:  Diagnosis Date   Bipolar disorder ([place])    Depression    Schizophrenia ([place])    Seizures ([place])     Past Surgical History: Past Surgical History:  Procedure Laterality Date   FINGER SURGERY     IR GASTROSTOMY TUBE MOD SED  01/16/2021   LEFT HEART CATH AND CORONARY ANGIOGRAPHY N/A 12/25/2020   Procedure: LEFT HEART CATH AND CORONARY ANGIOGRAPHY;  Surgeon: Nelva Bush, MD;  Location: [facility];  Service: Cardiovascular;  Laterality: N/A;    Allergies:  No Known Allergies  Social History:  reports that he has been smoking. He has never used smokeless tobacco. He reports current drug use. Drugs: Marijuana and Cocaine. He reports that he does not drink alcohol.  Family History: No family history on file.  Physical Exam: Vitals: Temperature 97.1, pulse 95, respiratory rate 18, blood pressure 105/65, pulse oximetry 100%  Constitutional: Ill-appearing male, awake, not in any acute distress Head: Atraumatic, normocephalic Eyes: PERLA, EOMI, irises appear normal, anicteric sclera,  ENMT: external ears and nose appear normal, normal hearing, moist oral mucosa  Neck: Trach in place CVS: S1-S2   Respiratory: Coarse breath sounds with rhonchi, no wheezing  Abdomen: soft nontender, nondistended, normal bowel sounds Musculoskeletal: No edema  Neuro: Debility with generalized weakness otherwise grossly nonfocal Psych: stable mood and affect, mental status Skin: no rashes   Data reviewed:  I have personally reviewed following labs and imaging studies Labs:  CBC: Recent Labs  Lab 02/04/21 0458 02/06/21 0337 02/06/21 1515 02/07/21 0454  WBC 17.4* 16.0* 17.1* 17.2*  HGB 9.3* 9.8*  8.3* 9.7*  HCT 28.3* 31.0* 26.7* 30.4*  MCV 88.2 89.3 89.3 88.1  PLT 250 292 260 408    Basic Metabolic Panel: Recent Labs  Lab 02/04/21 0458 02/06/21 0337 02/06/21 1515 02/07/21 0454  NA 135  --  136 138  K 3.8  --  3.3* 3.6  CL 102  --  105 106  CO2 26  --  25 24   GLUCOSE 153*  --  105* 99  BUN 10  --  10 10  CREATININE 0.68  --  0.59* 0.54*  CALCIUM 8.3*  --  7.7* 8.4*  PHOS  --  2.8  --   --    GFR CrCl cannot be calculated (Unknown ideal weight.). Liver Function Tests: Recent Labs  Lab 02/06/21 0337  AST 18  ALT 24  ALKPHOS 92  BILITOT 0.9  PROT 6.6  ALBUMIN 2.3*   No results for input(s): LIPASE, AMYLASE in the last 168 hours. Recent Labs  Lab 02/06/21 0337  AMMONIA 17   Coagulation profile No results for input(s): INR, PROTIME in the last 168 hours.  Cardiac Enzymes: No results for input(s): CKTOTAL, CKMB, CKMBINDEX, TROPONINI in the last 168 hours. BNP: Invalid input(s): POCBNP CBG: No results for input(s): GLUCAP in the last 168 hours. D-Dimer No results for input(s): DDIMER in the last 72 hours. Hgb A1c No results for input(s): HGBA1C in the last 72 hours. Lipid Profile No results for input(s): CHOL, HDL, LDLCALC, TRIG, CHOLHDL, LDLDIRECT in the last 72 hours. Thyroid function studies No results for input(s): TSH, T4TOTAL, T3FREE, THYROIDAB in the last 72 hours.  Invalid input(s): FREET3 Anemia work up No results for input(s): VITAMINB12, FOLATE, FERRITIN, TIBC, IRON, RETICCTPCT in the last 72 hours. Urinalysis    Component Value Date/Time   COLORURINE YELLOW 12/18/2020 1217   APPEARANCEUR HAZY (A) 12/18/2020 1217   APPEARANCEUR Hazy 03/12/2014 1249   LABSPEC 1.014 12/18/2020 1217   LABSPEC 1.026 03/12/2014 1249   PHURINE 5.0 12/18/2020 1217   GLUCOSEU NEGATIVE 12/18/2020 1217   GLUCOSEU Negative 03/12/2014 1249   HGBUR NEGATIVE 12/18/2020 1217   BILIRUBINUR NEGATIVE 12/18/2020 1217   BILIRUBINUR Negative 03/12/2014 1249   KETONESUR NEGATIVE 12/18/2020 1217   PROTEINUR NEGATIVE 12/18/2020 1217   NITRITE NEGATIVE 12/18/2020 1217   LEUKOCYTESUR NEGATIVE 12/18/2020 1217   LEUKOCYTESUR Negative 03/12/2014 1249     Sepsis Labs Invalid input(s): PROCALCITONIN,  WBC,  LACTICIDVEN Microbiology Recent  Results (from the past 240 hour(s))  Culture, blood (routine x 2)     Status: None (Preliminary result)   Collection Time: 02/06/21  3:16 PM   Specimen: BLOOD  Result Value Ref Range Status   Specimen Description BLOOD LEFT ANTECUBITAL  Final   Special Requests   Final    BOTTLES DRAWN AEROBIC AND ANAEROBIC Blood Culture adequate volume   Culture   Final    NO GROWTH 3 DAYS Performed at [facility], [place] [address], [place], [place] [address]    Report Status PENDING  Incomplete  Culture, blood (routine x 2)     Status: None (Preliminary result)   Collection Time: 02/06/21  3:17 PM   Specimen: BLOOD  Result Value Ref Range Status   Specimen Description BLOOD LEFT ANTECUBITAL  Final   Special Requests   Final    BOTTLES DRAWN AEROBIC AND ANAEROBIC Blood Culture adequate volume   Culture   Final    NO GROWTH 3 DAYS Performed at [facility], [place]  [address] [place], [place] [address]    Report Status PENDING  Incomplete    Inpatient Medications:   Scheduled Meds: Please see MAR   Radiological Exams on Admission: No results found.  Impression/Recommendations Active Problems:   Acute on chronic respiratory failure with hypoxia (HCC) Bilateral multifocal pneumonia Leukocytosis   Status epilepticus (HCC)   Polysubstance abuse (HCC)     Acute metabolic encephalopathy Dysphagia/protein calorie malnutrition History of bipolar, schizophrenia Abnormal EKG status post left heart cath Elevated LFTs Sacrococcygeal pressure ulcer stage IV  Acute on chronic respiratory failure with hypoxemia: He has a trach in place.  Per primary team and nursing continuing to have increased secretions.  Respiratory cultures showing Serratia, diphtheroids concerning for aspiration.  He received treatment with ceftriaxone now on ciprofloxacin.  Also on Flagyl for anaerobic coverage.  Recommend to add IV vancomycin for gram-positive coverage.  He has been started on Bactrim but continues to  have increasing secretions despite the Bactrim.  Therefore suggest to discontinue the Bactrim and add vancomycin as mentioned above.  Please monitor CBC, BUN/creatinine closely while on antibiotics.  Follow aspiration precaution.  Unfortunately he is high risk for worsening respiratory failure and worsening pneumonia despite being on antibiotics due to concern for aspiration.  If his respiratory status worsens consider obtaining chest CT which can be done without contrast if concern for renal compromise to evaluate for possible empyema/lung abscess given the persistent ongoing secretions.  Bilateral multifocal pneumonia: Chest x-ray showing findings concerning for progressive multifocal bilateral pneumonia.  Antibiotics and plan as mentioned above.  Unfortunately due to high suspicion for ongoing aspiration he is at risk for worsening pneumonia and worsening respiratory failure despite being on antibiotics.  Consider CT of the chest to evaluate if his respiratory status does not improve or if he is continuing to have increased secretions.  Leukocytosis: Likely secondary to the pneumonia.  Continue to monitor.  He is also high risk for C. difficile infection given the multiple antibiotics that he has required.  If he continues to have worsening leukocytosis or worsening fevers recommend to send for pancultures including urine cultures.  If he starts having diarrhea suggest to send stool for C. difficile and start him on p.o. vancomycin.  Status epilepticus: On Keppra.  Currently seizure-free.  Continue management per primary team.  Encephalopathy: Continue supportive management per primary team.  Polysubstance abuse: Counseled on cessation.  Dysphagia/protein calorie malnutrition: Due to the dysphagia he is at risk for aspiration and worsening aspiration pneumonia despite being on antibiotics for management of chronically malnutrition per the primary team.  Elevated LFTs: Continue to monitor closely.   Further management per primary team.  Abnormal EKG: Patient underwent heart catheterization at the acute facility and the abnormal EKG was thought to be secondary to LVH.  There is also some mention about junctional rhythm noted briefly with long QT.  He is currently on ciprofloxacin and seems to be tolerating this.  However, if he continues to have prolonged QT then consider discontinuing ciprofloxacin and switching back to ceftriaxone.  Reluctant to use cefepime as this may lower the seizure threshold.  History of bipolar/schizophrenia: Continue medications and management per the primary team.  Sacrococcygeal pressure ulcer stage IV: Continue local wound care.  Wound appears to be stable at this time.  If the wound worsens consider CT imaging to evaluate and consulting surgery for possible debridement.  Due to his complex medical problems he is high risk for worsening and decompensation.  Plan of care discussed with  the patient.  Also discussed with the primary team.  Thank you for this consultation.    Yaakov Guthrie M.D. 02/09/2021, 1:46 PM

## 2021-02-10 ENCOUNTER — Other Ambulatory Visit (HOSPITAL_COMMUNITY): Payer: Medicare HMO

## 2021-02-10 LAB — BASIC METABOLIC PANEL
Anion gap: 7 (ref 5–15)
BUN: 6 mg/dL (ref 6–20)
CO2: 27 mmol/L (ref 22–32)
Calcium: 8.3 mg/dL — ABNORMAL LOW (ref 8.9–10.3)
Chloride: 104 mmol/L (ref 98–111)
Creatinine, Ser: 0.58 mg/dL — ABNORMAL LOW (ref 0.61–1.24)
GFR, Estimated: >60 mL/min (ref >60)
Glucose, Bld: 98 mg/dL (ref 70–99)
Potassium: 3 mmol/L — ABNORMAL LOW (ref 3.5–5.1)
Sodium: 138 mmol/L (ref 135–145)

## 2021-02-10 LAB — CBC
HCT: 27.4 % — ABNORMAL LOW (ref 39.0–52.0)
Hemoglobin: 8.9 g/dL — ABNORMAL LOW (ref 13.0–17.0)
MCH: 28.4 pg (ref 26.0–34.0)
MCHC: 32.5 g/dL (ref 30.0–36.0)
MCV: 87.5 fL (ref 80.0–100.0)
Platelets: 349 10*3/uL (ref 150–400)
RBC: 3.13 MIL/uL — ABNORMAL LOW (ref 4.22–5.81)
RDW: 16.6 % — ABNORMAL HIGH (ref 11.5–15.5)
WBC: 11.7 10*3/uL — ABNORMAL HIGH (ref 4.0–10.5)
nRBC: 0 % (ref 0.0–0.2)

## 2021-02-10 LAB — VANCOMYCIN, TROUGH: Vancomycin Tr: 20 ug/mL (ref 15–20)

## 2021-02-10 IMAGING — CT CT CHEST W/ CM
2 of 3 series · 15 of 36 positions shown, 18 images · IV contrast (omnipaque)
Comparison: None.

CLINICAL DATA: Pneumonia, effusion or abscess suspected, abnormal
chest x-ray

EXAM:
CT CHEST WITH CONTRAST
TECHNIQUE: Multidetector CT imaging of the chest was performed during
intravenous contrast administration.
CONTRAST:  100mL OMNIPAQUE IOHEXOL 300 MG/ML  SOLN

[Series 3: chest with 2mm st · axial · 0.71mm/px · z∈[+1256,+1554]mm · 12 of 177 slices shown, 15 images]
[im 14/177  mediastinal]
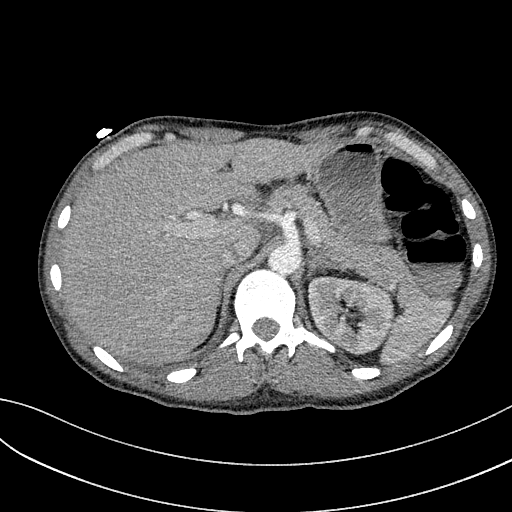
[im 14/177  lung]
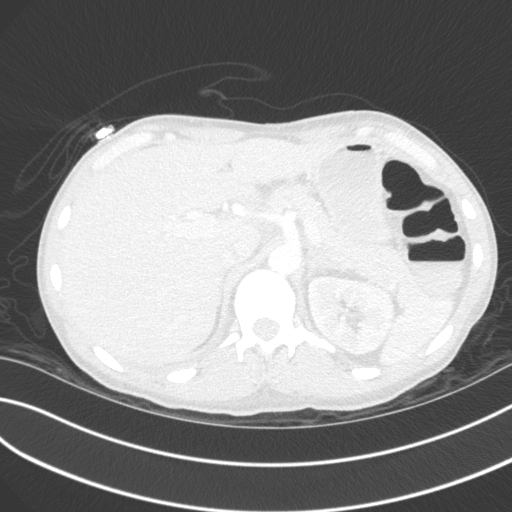
[im 27/177  lung]
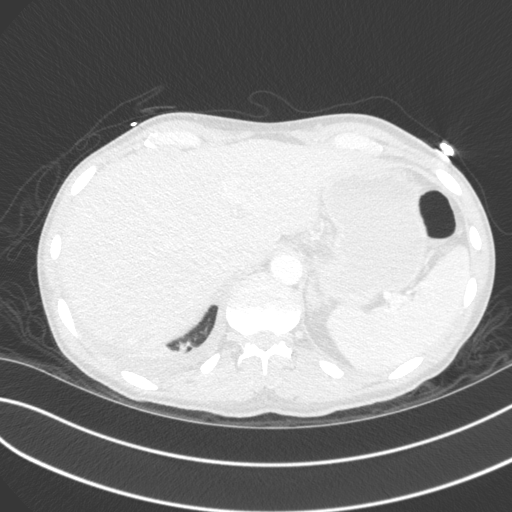
[im 40/177  lung]
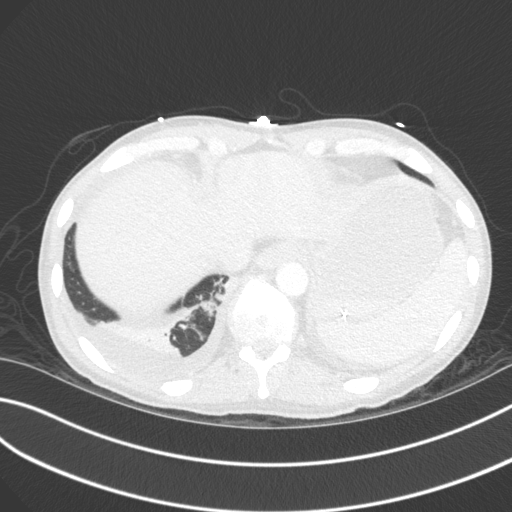
[im 53/177  lung]
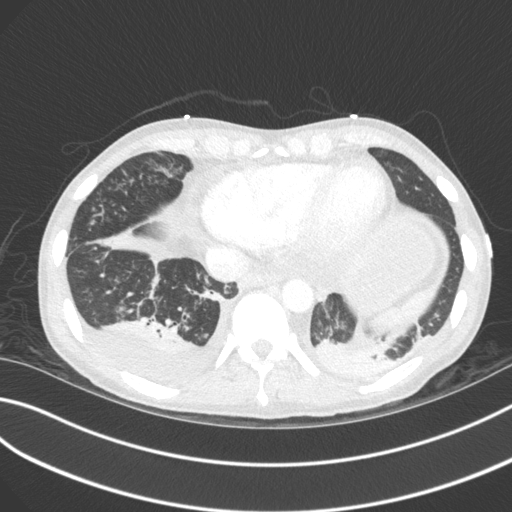
[im 66/177  mediastinal]
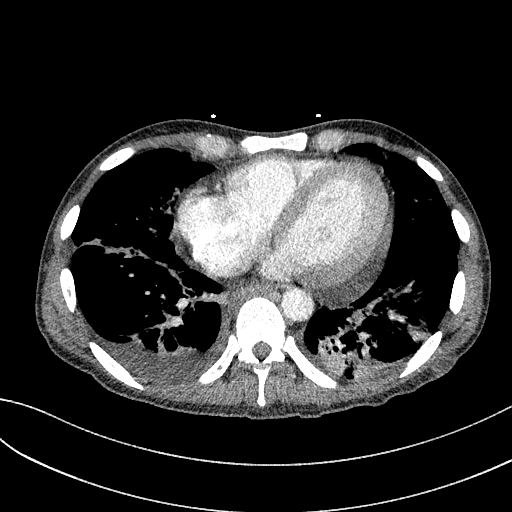
[im 66/177  lung]
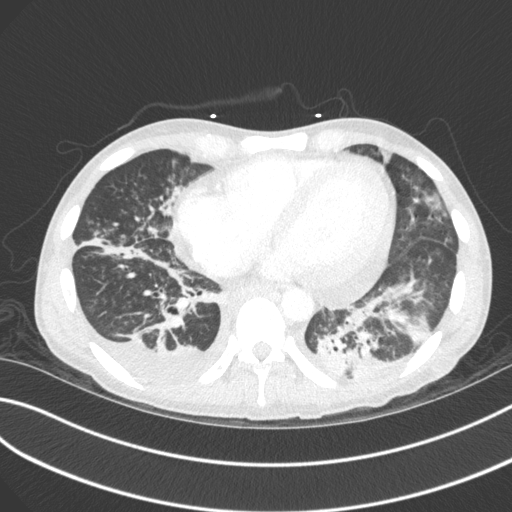
[im 79/177  lung]
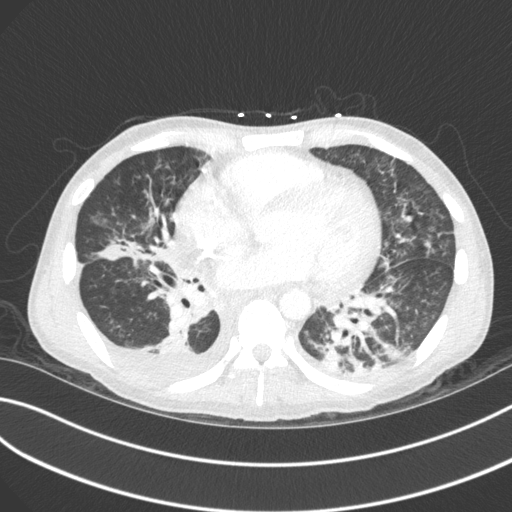
[im 98/177  lung]
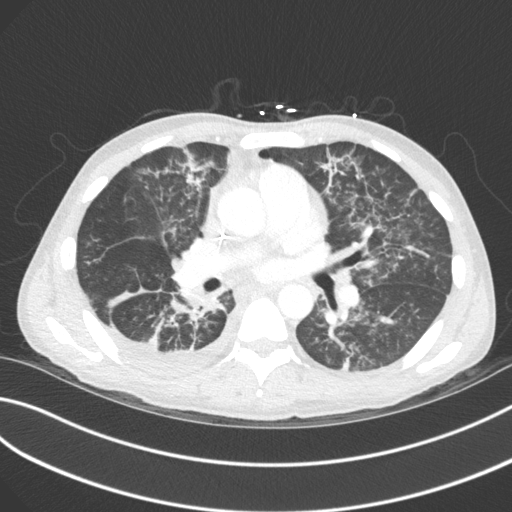
[im 111/177  lung]
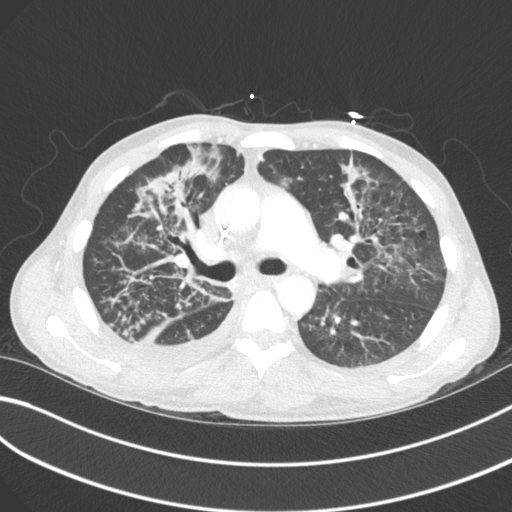
[im 124/177  mediastinal]
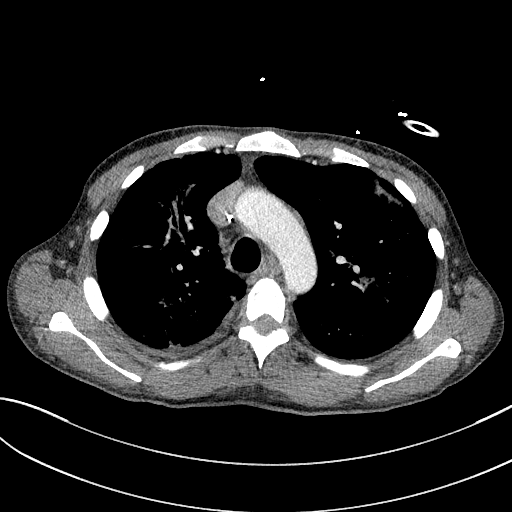
[im 124/177  lung]
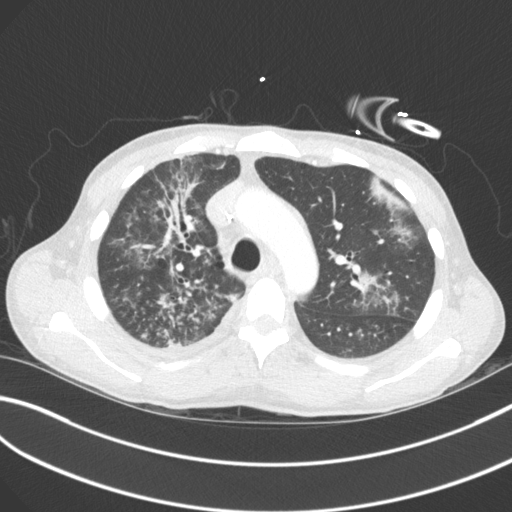
[im 137/177  lung]
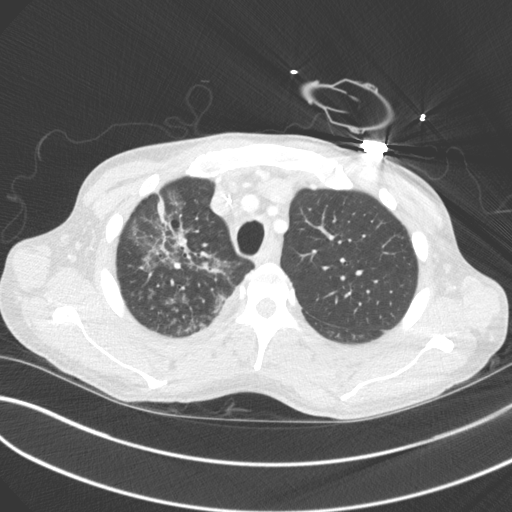
[im 150/177  lung]
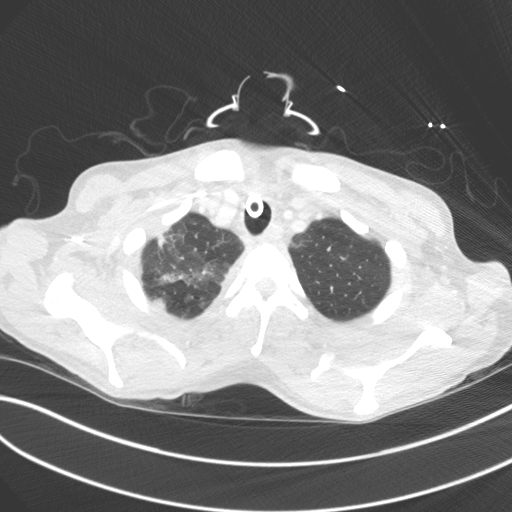
[im 163/177  lung]
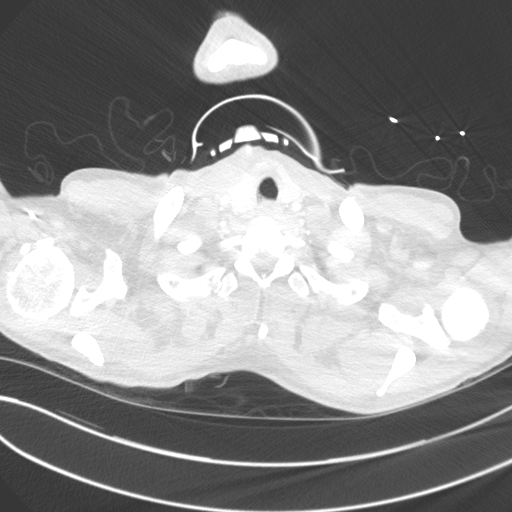

[Series 6: chest with 2mm st cor · coronal · 0.69mm/px · 3 of 134 slices shown]
[im 27/134  lung]
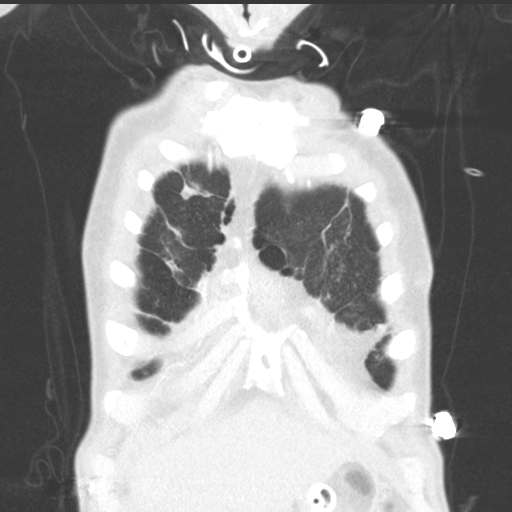
[im 54/134  lung]
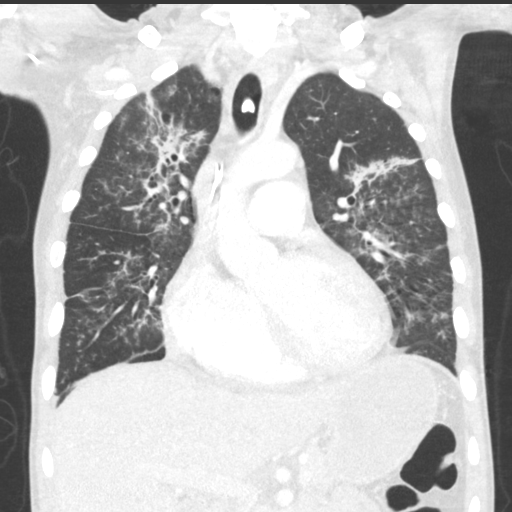
[im 80/134  lung]
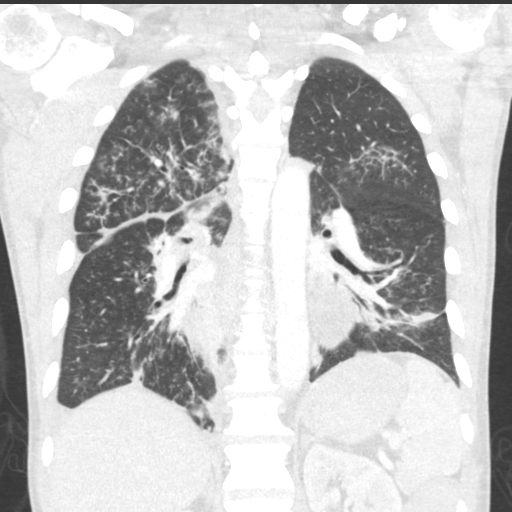

[15 of 36 positions shown; findings below may reference images not displayed]

FINDINGS: Cardiovascular: Right upper extremity PICC line tip noted within the
right atrium. Global cardiac size within normal limits. No
significant coronary artery calcification. Small pericardial
effusion without CT evidence of cardiac tamponade. The central
pulmonary arteries are of normal caliber. The thoracic aorta is
unremarkable.

Mediastinum/Nodes: Tracheostomy in expected position. Visualized
thyroid is unremarkable. Shotty right hilar adenopathy may be
reactive in nature. No frankly pathologic adenopathy within the
thorax. The esophagus is unremarkable.

Lungs/Pleura: There is extensive asymmetric perihilar centrilobular
and peribronchial nodular infiltrate with scattered areas of
consolidation. These are most in keeping with atypical infection
including viral and atypical mycobacterial infection. There are
areas of segmental chronic collapse and consolidation with traction
bronchiectasis within the lower lobes bilaterally, possibly the
sequela of remote infection or inflammation. Small right pleural
effusion is present. No pneumothorax. No central obstructing lesion.

Upper Abdomen: Percutaneous gastrostomy noted within the visualized
upper abdomen. No acute abnormality.

Musculoskeletal: No acute bone abnormality. No lytic or blastic bone
lesion.
IMPRESSION: Extensive asymmetric peribronchial and centrilobular nodularity with
peripheral areas of parenchymal consolidation most in keeping with
atypical infection. Superimposed bibasilar areas of parenchymal
scarring.

Small right pleural effusion.

Small pericardial effusion.

## 2021-02-10 MED ORDER — IOHEXOL 300 MG/ML  SOLN
100.0000 mL | Freq: Once | INTRAMUSCULAR | Status: AC | PRN
  Administered 2021-02-10: 100 mL via INTRAVENOUS

## 2021-02-11 ENCOUNTER — Other Ambulatory Visit (HOSPITAL_COMMUNITY): Payer: Medicare HMO

## 2021-02-11 LAB — CULTURE, BLOOD (ROUTINE X 2)
Culture: NO GROWTH
Culture: NO GROWTH
Special Requests: ADEQUATE
Special Requests: ADEQUATE

## 2021-02-11 LAB — POTASSIUM: Potassium: 3.7 mmol/L (ref 3.5–5.1)

## 2021-02-12 ENCOUNTER — Other Ambulatory Visit (HOSPITAL_COMMUNITY): Payer: Medicare HMO

## 2021-02-12 IMAGING — DX DG ABDOMEN 1V
1 series · 1 of 1 positions shown · non-contrast
Comparison: None.

CLINICAL DATA: Ileus

EXAM:
ABDOMEN - 1 VIEW

[abdomen]
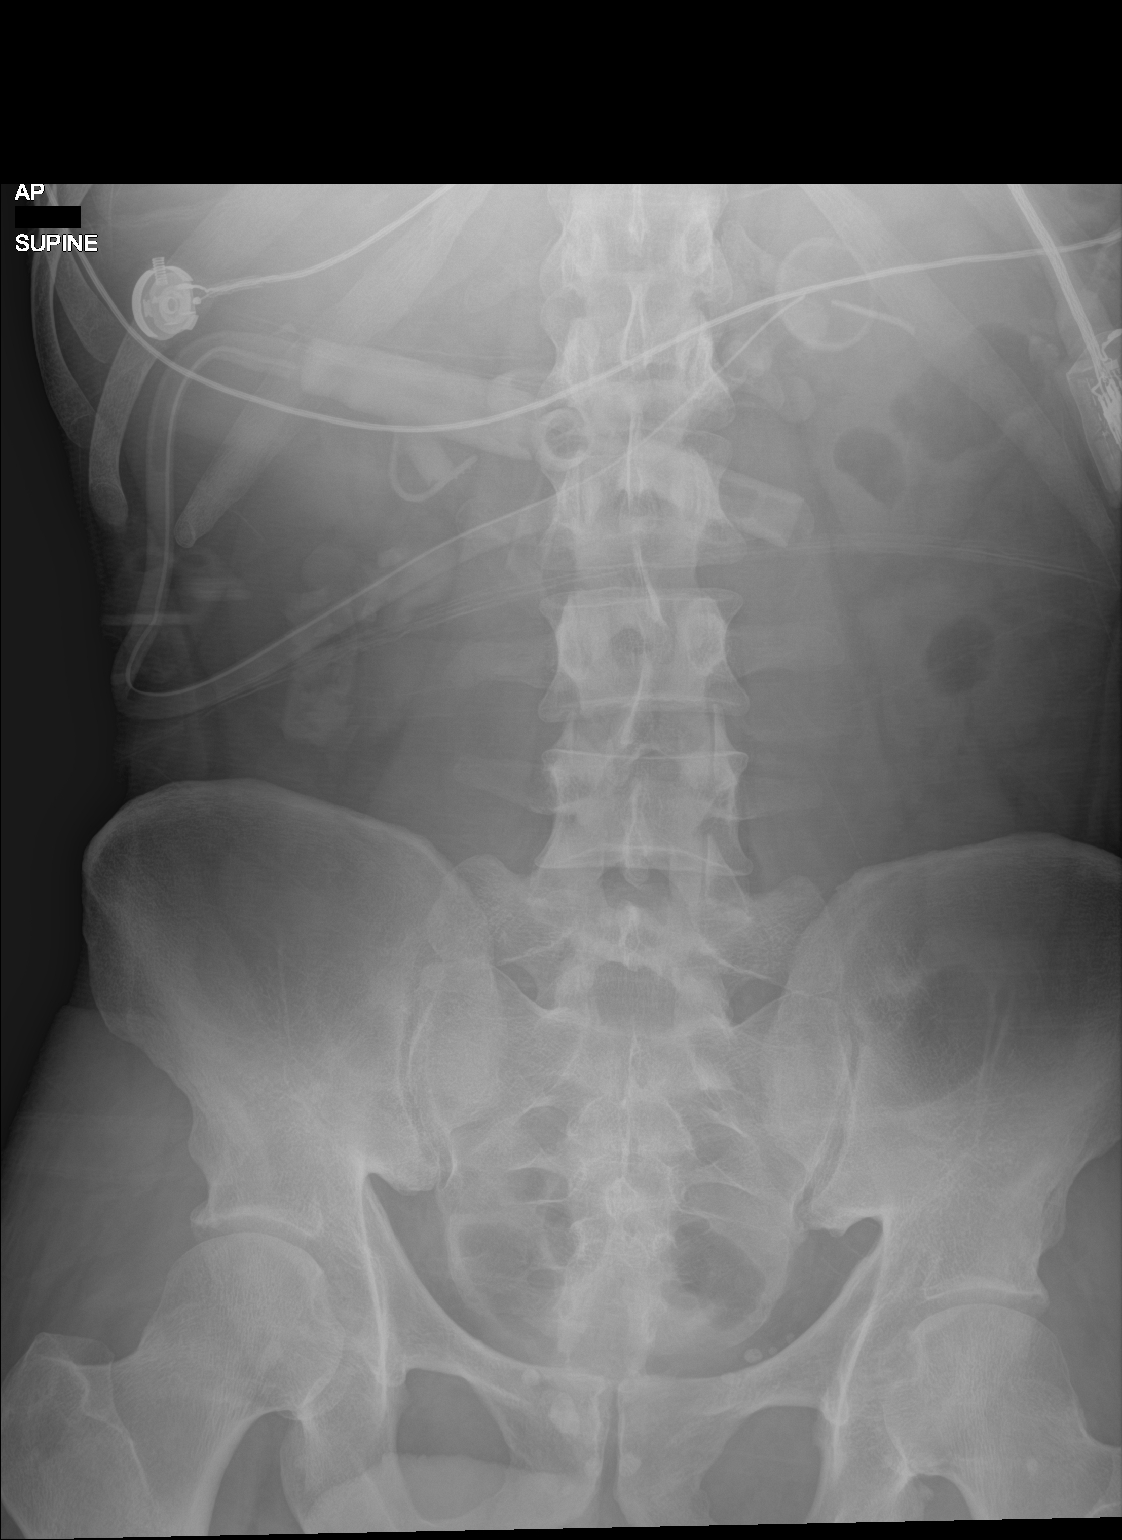

[1 of 1 positions shown; findings below may reference images not displayed]

FINDINGS: The bowel gas pattern is normal. No radio-opaque calculi or other
significant radiographic abnormality are seen. Unchanged appearance
of gastrostomy tube.
IMPRESSION: Negative.

## 2021-02-12 NOTE — Progress Notes (Signed)
Pulmonary [place]  PROGRESS NOTE     Luis Hunter  HBZ:[id number]  DOB: 1975/05/21   DOA: 01/16/2021  Referring Physician: Satira Sark, MD  HPI: Luis Hunter is a 45 y.o. male being followed for ventilator/airway/oxygen weaning Acute on Chronic Respiratory Failure.  Low-grade fevers noted resting comfortably right now without distress at this time  Medications: Reviewed on Rounds  Physical Exam:  Vitals: Temperature is 99.3 pulse 80 respiratory is 20 blood pressure is 104/60 saturations 99%  Ventilator Settings on T collar FiO2 28%  General: Comfortable at this time Neck: supple Cardiovascular: no malignant arrhythmias Respiratory: No rhonchi very coarse breath sounds Skin: no rash seen on limited exam Musculoskeletal: No gross abnormality Psychiatric:unable to assess Neurologic:no involuntary movements         Lab Data:   Basic Metabolic Panel: Recent Labs  Lab 02/06/21 0337 02/06/21 1515 02/07/21 0454 02/10/21 0339 02/11/21 0353  NA  --  136 138 138  --   K  --  3.3* 3.6 3.0* 3.7  CL  --  105 106 104  --   CO2  --  25 24 27   --   GLUCOSE  --  105* 99 98  --   BUN  --  10 10 6   --   CREATININE  --  0.59* 0.54* 0.58*  --   CALCIUM  --  7.7* 8.4* 8.3*  --   PHOS 2.8  --   --   --   --     ABG: No results for input(s): PHART, PCO2ART, PO2ART, HCO3, O2SAT in the last 168 hours.  Liver Function Tests: Recent Labs  Lab 02/06/21 0337  AST 18  ALT 24  ALKPHOS 92  BILITOT 0.9  PROT 6.6  ALBUMIN 2.3*   No results for input(s): LIPASE, AMYLASE in the last 168 hours. Recent Labs  Lab 02/06/21 0337  AMMONIA 17    CBC: Recent Labs  Lab 02/06/21 0337 02/06/21 1515 02/07/21 0454 02/10/21 0339  WBC 16.0* 17.1* 17.2* 11.7*  HGB 9.8* 8.3* 9.7* 8.9*  HCT 31.0* 26.7* 30.4* 27.4*  MCV 89.3 89.3 88.1 87.5  PLT 292 260 287 349    Cardiac Enzymes: No results for  input(s): CKTOTAL, CKMB, CKMBINDEX, TROPONINI in the last 168 hours.  BNP (last 3 results) No results for input(s): BNP in the last 8760 hours.  ProBNP (last 3 results) No results for input(s): PROBNP in the last 8760 hours.  Radiological Exams: DG Abd 1 View  Result Date: 02/12/2021 CLINICAL DATA:  Ileus EXAM: ABDOMEN - 1 VIEW COMPARISON:  None. FINDINGS: The bowel gas pattern is normal. No radio-opaque calculi or other significant radiographic abnormality are seen. Unchanged appearance of gastrostomy tube. IMPRESSION: Negative. Electronically Signed   By: Ulyses Jarred M.D.   On: 02/12/2021 00:47    Assessment/Plan Active Problems:   Acute on chronic respiratory failure with hypoxia (HCC)   Status epilepticus (HCC)   Polysubstance abuse (HCC)   Healthcare-associated pneumonia   Acute metabolic encephalopathy   Acute on chronic respiratory failure with hypoxia we will continue with T collar trials on 28% FiO2 secretions are moderate.  Patient has significant pulmonary infiltrates noted on last chest film Healthcare associated pneumonia multifocal disease patient being seen by infectious disease Status epilepticus no active seizure noted at this time patient will continue with antiseizure medication Polysubstance abuse supportive care Metabolic encephalopathy no change we will continue to  monitor along closely   I have personally seen and evaluated the patient, evaluated laboratory and imaging results, formulated the assessment and plan and placed orders. The Patient requires high complexity decision making with multiple systems involvement.  Rounds were done with the Respiratory Therapy Director and Staff therapists and discussed with nursing staff also.  Allyne Gee, MD [facility] Pulmonary Critical Care Medicine Sleep Medicine

## 2021-02-13 ENCOUNTER — Other Ambulatory Visit (HOSPITAL_COMMUNITY): Payer: Medicare HMO

## 2021-02-13 DIAGNOSIS — J189 Pneumonia, unspecified organism: Secondary | ICD-10-CM | POA: Diagnosis not present

## 2021-02-13 DIAGNOSIS — F1910 Other psychoactive substance abuse, uncomplicated: Secondary | ICD-10-CM | POA: Diagnosis not present

## 2021-02-13 DIAGNOSIS — G9341 Metabolic encephalopathy: Secondary | ICD-10-CM | POA: Diagnosis not present

## 2021-02-13 DIAGNOSIS — J9621 Acute and chronic respiratory failure with hypoxia: Secondary | ICD-10-CM | POA: Diagnosis not present

## 2021-02-13 LAB — SARS CORONAVIRUS 2 (TAT 6-24 HRS): SARS Coronavirus 2: NEGATIVE

## 2021-02-13 IMAGING — DX DG CHEST 1V PORT
1 series · 1 of 1 positions shown · non-contrast
Comparison: [DATE]

CLINICAL DATA: PICC line placement

EXAM:
PORTABLE CHEST 1 VIEW

[chest ap]
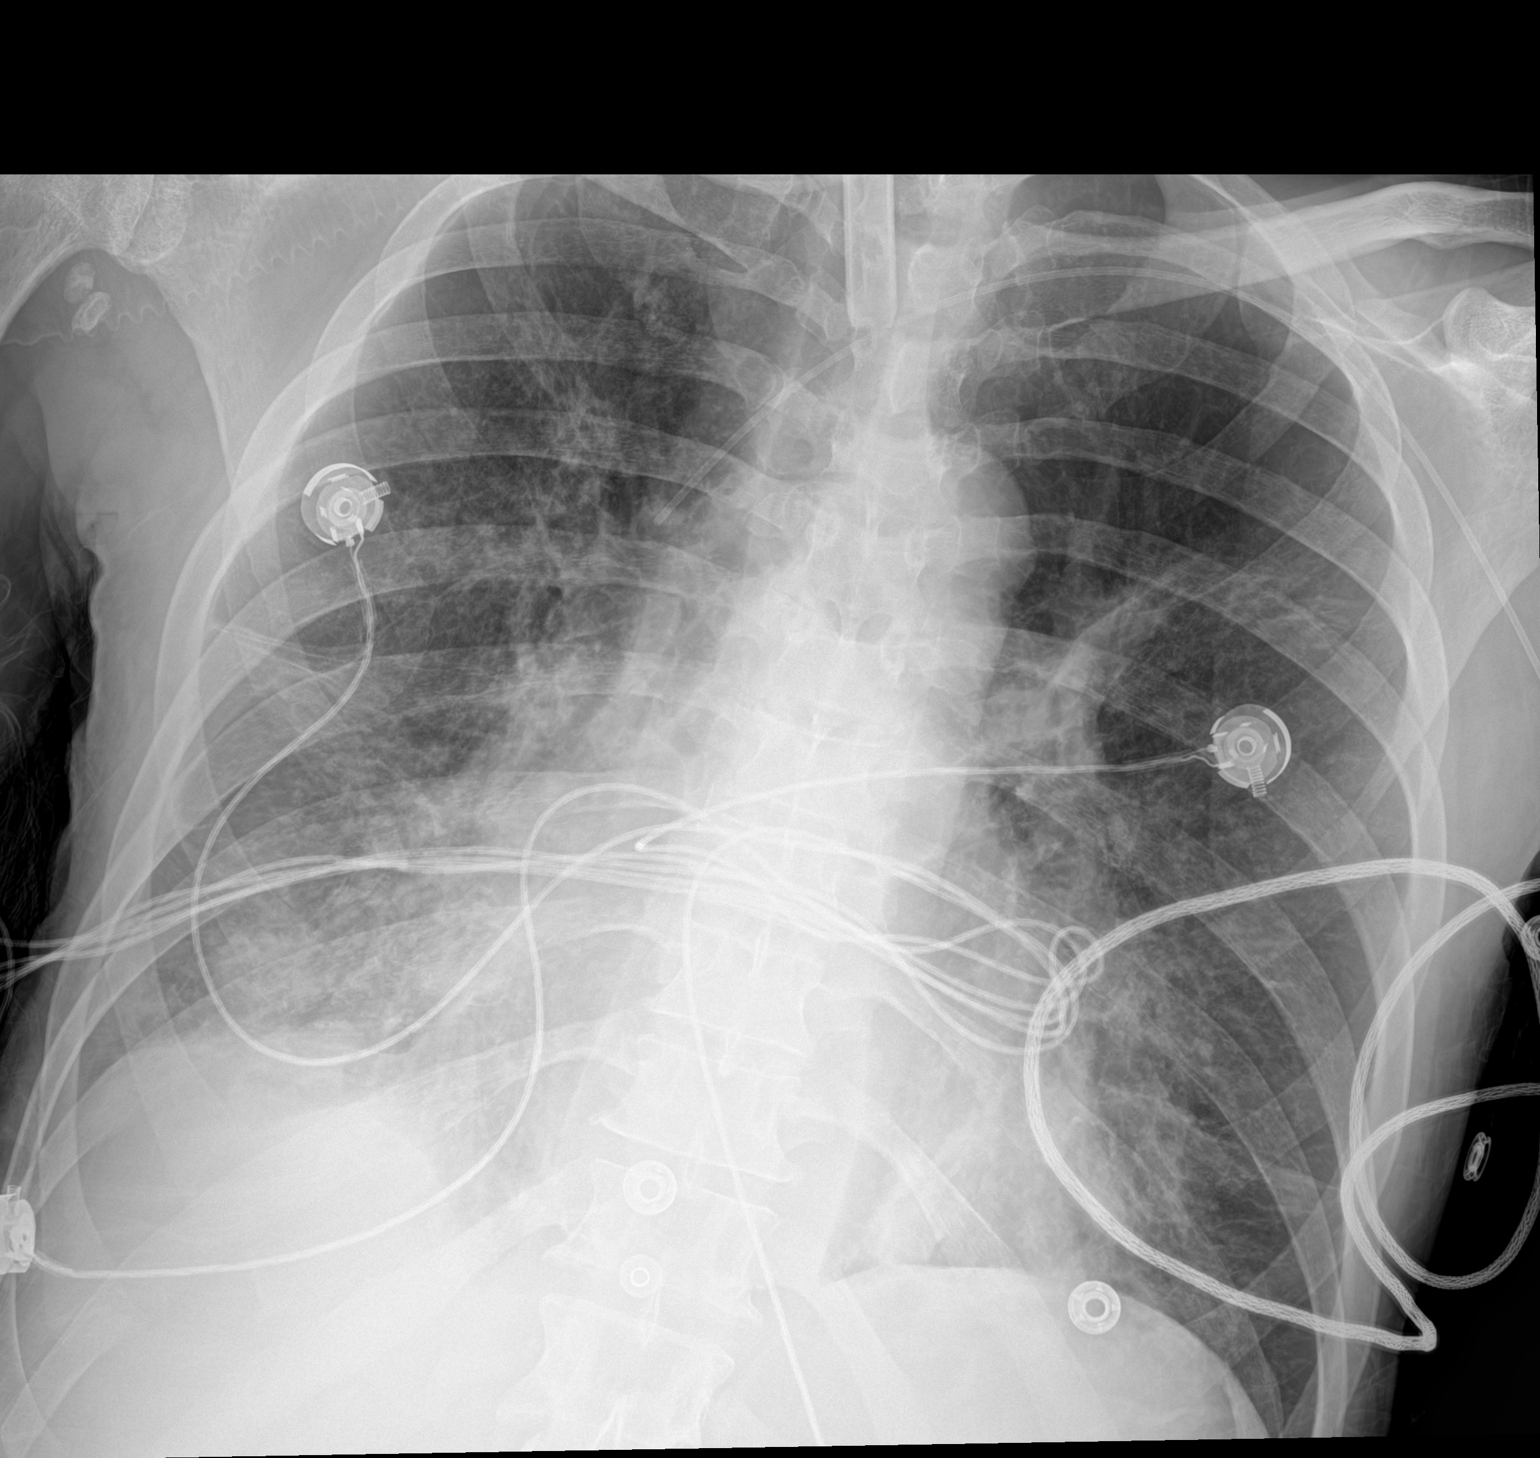

[1 of 1 positions shown; findings below may reference images not displayed]

FINDINGS: Single frontal view of the chest demonstrates stable tracheostomy
tube. Interval placement of a left-sided PICC tip overlying the
superior vena cava. The cardiac silhouette is stable. Persistent
bibasilar airspace disease, right greater than left. No effusion or
pneumothorax.
IMPRESSION: 1. Left-sided PICC, tip overlying SVC.
2. Persistent basilar predominant airspace disease, right greater
than left.

## 2021-02-13 IMAGING — DX DG CHEST 1V PORT
1 series · 1 of 1 positions shown · non-contrast
Comparison: [DATE]

CLINICAL DATA: Status post PICC line placement.

EXAM:
PORTABLE CHEST 1 VIEW

[chest]
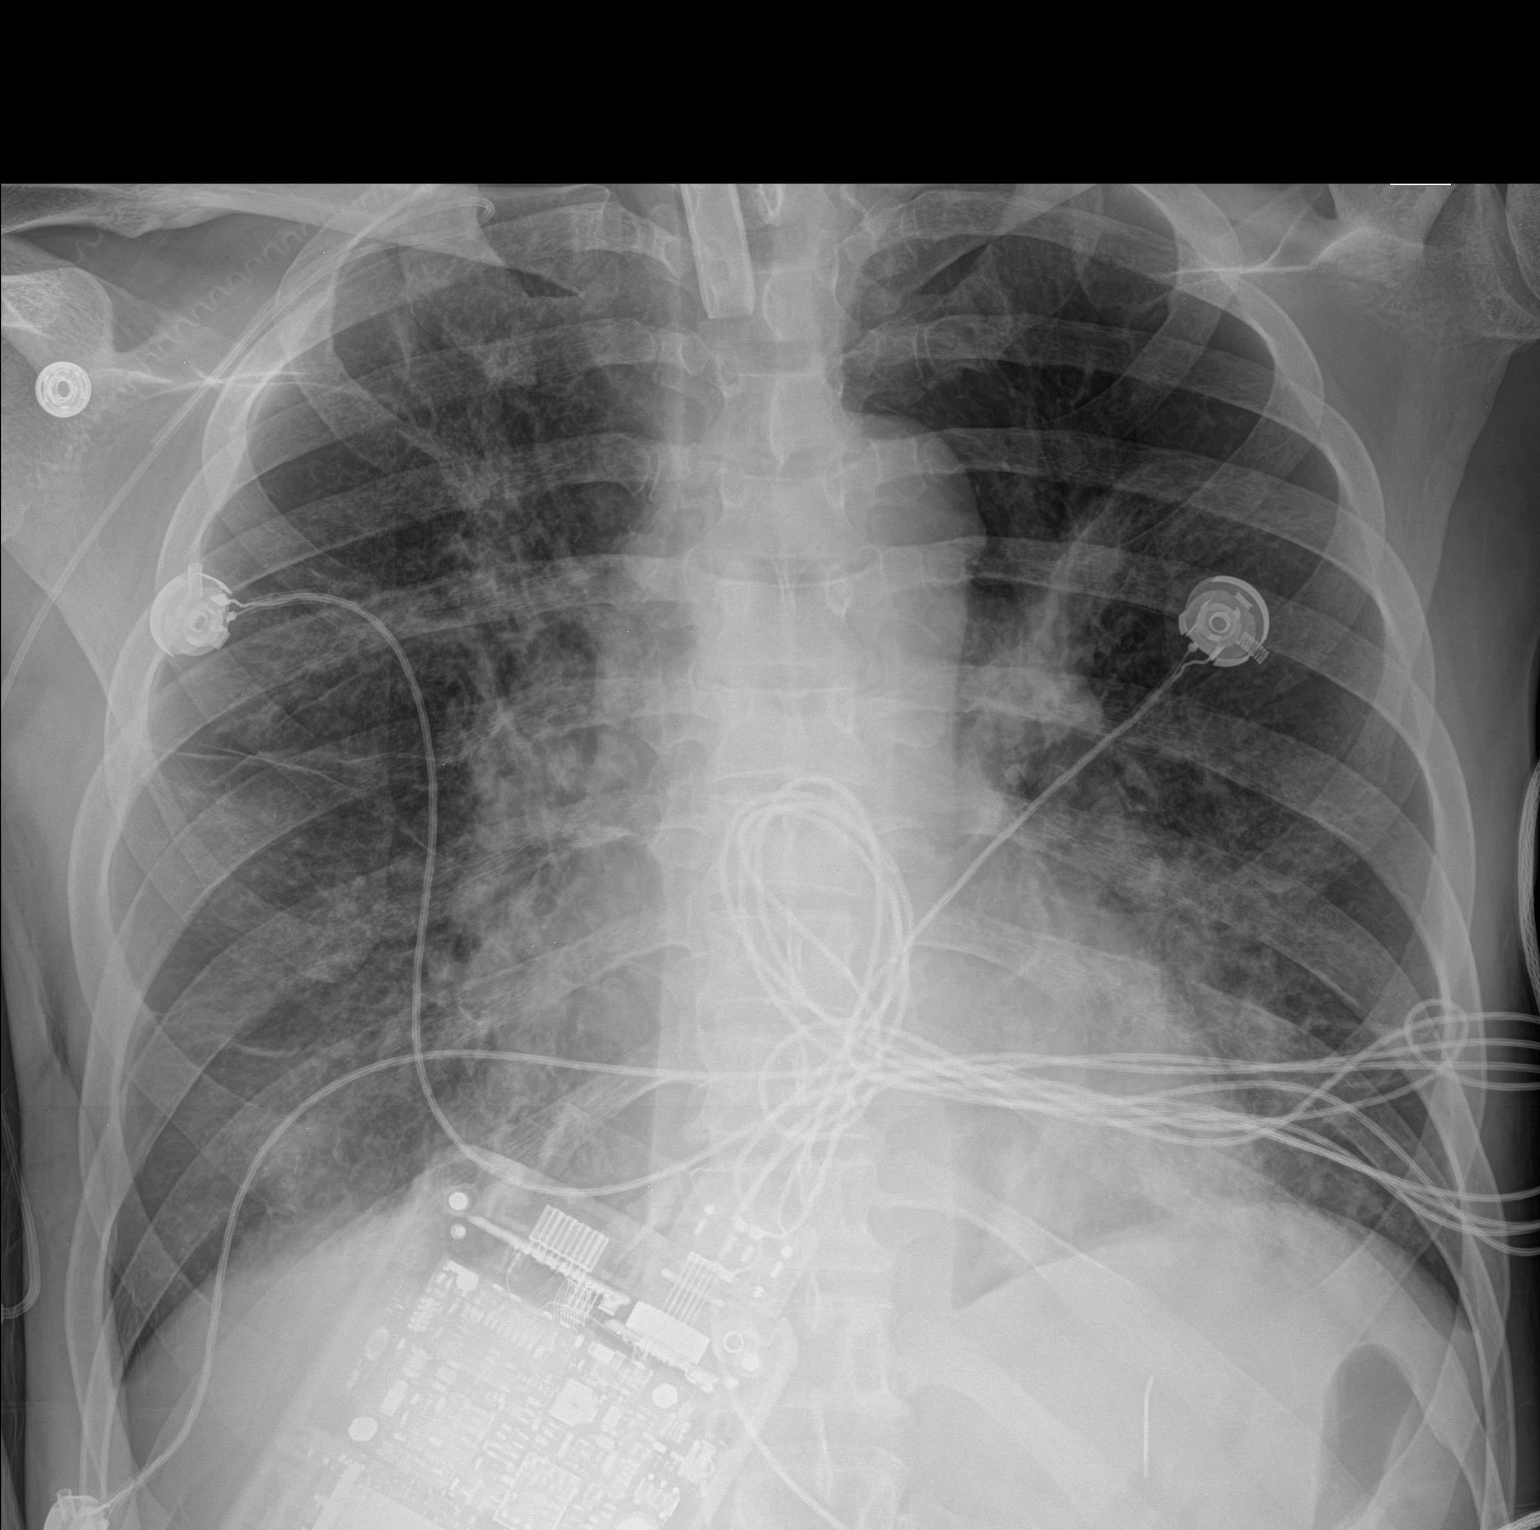

[1 of 1 positions shown; findings below may reference images not displayed]

FINDINGS: A right-sided PICC line is seen. Its distal end loops upon itself
with its distal tip seen overlying the mid right scapula. Stable
tracheostomy tube positioning is noted. Mild to moderate severity
bilateral infiltrates are seen. This is decreased in severity when
compared to the prior study. There is no evidence of a pleural
effusion or pneumothorax. The heart size and mediastinal contours
are within normal limits. The visualized skeletal structures are
unremarkable.
IMPRESSION: 1. Mild to moderate severity bilateral infiltrates, decreased in
severity when compared to the prior study.
2. Right-sided PICC line with its distal tip seen overlying the
expected region of the right axillary vein.

## 2021-02-13 NOTE — Progress Notes (Signed)
Pulmonary [place]  PROGRESS NOTE     Luis Hunter  [id number]:[id number]  DOB: Nov 02, 1975   DOA: 01/16/2021  Referring Physician: Satira Sark, MD  HPI: Luis Hunter is a 45 y.o. male being followed for ventilator/airway/oxygen weaning Acute on Chronic Respiratory Failure.  Patient currently is on T collar has been on 20% FiO2 secretions are still significant.  Also apparently the room partner tested positive for COVID  Medications: Reviewed on Rounds  Physical Exam:  Vitals: Temperature is 97.7 pulse 98 respiratory 20 blood pressure is 139/90 saturations 98%  Ventilator Settings off of the ventilator right now on T collar  General: Comfortable at this time Neck: supple Cardiovascular: no malignant arrhythmias Respiratory: No rhonchi no rales Skin: no rash seen on limited exam Musculoskeletal: No gross abnormality Psychiatric:unable to assess Neurologic:no involuntary movements         Lab Data:   Basic Metabolic Panel: Recent Labs  Lab 02/06/21 1515 02/07/21 0454 02/10/21 0339 02/11/21 0353  NA 136 138 138  --   K 3.3* 3.6 3.0* 3.7  CL 105 106 104  --   CO2 25 24 27   --   GLUCOSE 105* 99 98  --   BUN 10 10 6   --   CREATININE 0.59* 0.54* 0.58*  --   CALCIUM 7.7* 8.4* 8.3*  --     ABG: No results for input(s): PHART, PCO2ART, PO2ART, HCO3, O2SAT in the last 168 hours.  Liver Function Tests: No results for input(s): AST, ALT, ALKPHOS, BILITOT, PROT, ALBUMIN in the last 168 hours. No results for input(s): LIPASE, AMYLASE in the last 168 hours. No results for input(s): AMMONIA in the last 168 hours.  CBC: Recent Labs  Lab 02/06/21 1515 02/07/21 0454 02/10/21 0339  WBC 17.1* 17.2* 11.7*  HGB 8.3* 9.7* 8.9*  HCT 26.7* 30.4* 27.4*  MCV 89.3 88.1 87.5  PLT 260 287 349    Cardiac Enzymes: No results for input(s): CKTOTAL, CKMB, CKMBINDEX, TROPONINI in the last 168  hours.  BNP (last 3 results) No results for input(s): BNP in the last 8760 hours.  ProBNP (last 3 results) No results for input(s): PROBNP in the last 8760 hours.  Radiological Exams: DG Abd 1 View  Result Date: 02/12/2021 CLINICAL DATA:  Ileus EXAM: ABDOMEN - 1 VIEW COMPARISON:  None. FINDINGS: The bowel gas pattern is normal. No radio-opaque calculi or other significant radiographic abnormality are seen. Unchanged appearance of gastrostomy tube. IMPRESSION: Negative. Electronically Signed   By: Ulyses Jarred M.D.   On: 02/12/2021 00:47    Assessment/Plan Active Problems:   Acute on chronic respiratory failure with hypoxia (HCC)   Status epilepticus (HCC)   Polysubstance abuse ([place])   Healthcare-associated pneumonia   Acute metabolic encephalopathy   Acute on chronic respiratory failure with hypoxia patient continues to be on T collar trials right now 28% FiO2 secretions are still copious we will continue to monitor Status epilepticus no active seizure noted at this time COVID will be monitoring Healthcare associated pneumonia treated Metabolic encephalopathy patient is at baseline   I have personally seen and evaluated the patient, evaluated laboratory and imaging results, formulated the assessment and plan and placed orders. The Patient requires high complexity decision making with multiple systems involvement.  Rounds were done with the Respiratory Therapy Director and Staff therapists and discussed with nursing staff also.  Allyne Gee, MD [facility] Pulmonary Critical Care Medicine Sleep Medicine

## 2021-02-14 LAB — CBC
HCT: 31 % — ABNORMAL LOW (ref 39.0–52.0)
Hemoglobin: 9.9 g/dL — ABNORMAL LOW (ref 13.0–17.0)
MCH: 28.5 pg (ref 26.0–34.0)
MCHC: 31.9 g/dL (ref 30.0–36.0)
MCV: 89.3 fL (ref 80.0–100.0)
Platelets: 466 10*3/uL — ABNORMAL HIGH (ref 150–400)
RBC: 3.47 MIL/uL — ABNORMAL LOW (ref 4.22–5.81)
RDW: 17.5 % — ABNORMAL HIGH (ref 11.5–15.5)
WBC: 16.3 10*3/uL — ABNORMAL HIGH (ref 4.0–10.5)
nRBC: 0 % (ref 0.0–0.2)

## 2021-02-14 LAB — BASIC METABOLIC PANEL
Anion gap: 8 (ref 5–15)
BUN: 8 mg/dL (ref 6–20)
CO2: 31 mmol/L (ref 22–32)
Calcium: 8.4 mg/dL — ABNORMAL LOW (ref 8.9–10.3)
Chloride: 100 mmol/L (ref 98–111)
Creatinine, Ser: 0.32 mg/dL — ABNORMAL LOW (ref 0.61–1.24)
GFR, Estimated: >60 mL/min (ref >60)
Glucose, Bld: 95 mg/dL (ref 70–99)
Potassium: 3.2 mmol/L — ABNORMAL LOW (ref 3.5–5.1)
Sodium: 139 mmol/L (ref 135–145)

## 2021-02-14 LAB — VANCOMYCIN, TROUGH: Vancomycin Tr: 10 ug/mL — ABNORMAL LOW (ref 15–20)

## 2021-02-14 NOTE — Progress Notes (Signed)
Pulmonary [place]  PROGRESS NOTE     Luis Hunter  VPX:[id number]  DOB: [birth year]   DOA: 01/16/2021  Referring Physician: Satira Sark, MD  HPI: Luis Hunter is a 45 y.o. male being followed for ventilator/airway/oxygen weaning Acute on Chronic Respiratory Failure.  Patient is currently off the ventilator has been on T collar currently on 28% FiO2 secretions are still quite copious  Medications: Reviewed on Rounds  Physical Exam:  Vitals: Temperature 98.1 pulse 58 respiratory is 34 blood pressure 101/59 saturations 95%  Ventilator Settings off ventilator on T collar currently on 28% FiO2  General: Comfortable at this time Neck: supple Cardiovascular: no malignant arrhythmias Respiratory: Scattered rhonchi are noted Skin: no rash seen on limited exam Musculoskeletal: No gross abnormality Psychiatric:unable to assess Neurologic:no involuntary movements         Lab Data:   Basic Metabolic Panel: Recent Labs  Lab 02/10/21 0339 02/11/21 0353 02/14/21 0706  NA 138  --  139  K 3.0* 3.7 3.2*  CL 104  --  100  CO2 27  --  31  GLUCOSE 98  --  95  BUN 6  --  8  CREATININE 0.58*  --  0.32*  CALCIUM 8.3*  --  8.4*    ABG: No results for input(s): PHART, PCO2ART, PO2ART, HCO3, O2SAT in the last 168 hours.  Liver Function Tests: No results for input(s): AST, ALT, ALKPHOS, BILITOT, PROT, ALBUMIN in the last 168 hours. No results for input(s): LIPASE, AMYLASE in the last 168 hours. No results for input(s): AMMONIA in the last 168 hours.  CBC: Recent Labs  Lab 02/10/21 0339 02/14/21 0706  WBC 11.7* 16.3*  HGB 8.9* 9.9*  HCT 27.4* 31.0*  MCV 87.5 89.3  PLT 349 466*    Cardiac Enzymes: No results for input(s): CKTOTAL, CKMB, CKMBINDEX, TROPONINI in the last 168 hours.  BNP (last 3 results) No results for input(s): BNP in the last 8760 hours.  ProBNP (last 3 results) No  results for input(s): PROBNP in the last 8760 hours.  Radiological Exams: DG CHEST PORT 1 VIEW  Result Date: 02/13/2021 CLINICAL DATA:  PICC line placement EXAM: PORTABLE CHEST 1 VIEW COMPARISON:  02/13/2021 FINDINGS: Single frontal view of the chest demonstrates stable tracheostomy tube. Interval placement of a left-sided PICC tip overlying the superior vena cava. The cardiac silhouette is stable. Persistent bibasilar airspace disease, right greater than left. No effusion or pneumothorax. IMPRESSION: 1. Left-sided PICC, tip overlying SVC. 2. Persistent basilar predominant airspace disease, right greater than left. Electronically Signed   By: Randa Ngo M.D.   On: 02/13/2021 16:59   DG Chest Port 1 View  Result Date: 02/13/2021 CLINICAL DATA:  Status post PICC line placement. EXAM: PORTABLE CHEST 1 VIEW COMPARISON:  February 05, 2021 FINDINGS: A right-sided PICC line is seen. Its distal end loops upon itself with its distal tip seen overlying the mid right scapula. Stable tracheostomy tube positioning is noted. Mild to moderate severity bilateral infiltrates are seen. This is decreased in severity when compared to the prior study. There is no evidence of a pleural effusion or pneumothorax. The heart size and mediastinal contours are within normal limits. The visualized skeletal structures are unremarkable. IMPRESSION: 1. Mild to moderate severity bilateral infiltrates, decreased in severity when compared to the prior study. 2. Right-sided PICC line with its distal tip seen overlying the expected region of the right axillary vein. Electronically  Signed   By: Virgina Norfolk M.D.   On: 02/13/2021 15:43    Assessment/Plan Active Problems:   Acute on chronic respiratory failure with hypoxia (HCC)   Status epilepticus (HCC)   Polysubstance abuse (HCC)   Healthcare-associated pneumonia   Acute metabolic encephalopathy   Acute on chronic respiratory failure with hypoxia as secretions improved we  will try to advance weaning however right now is requiring frequent suctioning. Status epilepticus no change we will continue to follow along closely Polysubstance abuse supportive care Healthcare associated pneumonia has been treated still with bilateral infiltrate noted on the last film Metabolic encephalopathy no changes noted overall   I have personally seen and evaluated the patient, evaluated laboratory and imaging results, formulated the assessment and plan and placed orders. The Patient requires high complexity decision making with multiple systems involvement.  Rounds were done with the Respiratory Therapy Director and Staff therapists and discussed with nursing staff also.  Allyne Gee, MD [facility] Pulmonary Critical Care Medicine Sleep Medicine

## 2021-02-15 NOTE — Progress Notes (Signed)
Pulmonary [place]  PROGRESS NOTE     AVARY PITSENBARGER  [id number]:[id number]  DOB: 1976/02/24   DOA: 01/16/2021  Referring Physician: Satira Sark, MD  HPI: DUVALL COMES is a 45 y.o. male being followed for ventilator/airway/oxygen weaning Acute on Chronic Respiratory Failure.  Patient is comfortable right now without distress at this time has been on T collar currently on 28% FiO2 secretions are still reportedly very thick  Medications: Reviewed on Rounds  Physical Exam:  Vitals: Temperature is 97.5 pulse 85 respiratory 17 blood pressure is 125/70 saturations 99%  Ventilator Settings on T collar FiO2 28%  General: Comfortable at this time Neck: supple Cardiovascular: no malignant arrhythmias Respiratory: Coarse breath sounds with some rhonchi Skin: no rash seen on limited exam Musculoskeletal: No gross abnormality Psychiatric:unable to assess Neurologic:no involuntary movements         Lab Data:   Basic Metabolic Panel: Recent Labs  Lab 02/10/21 0339 02/11/21 0353 02/14/21 0706  NA 138  --  139  K 3.0* 3.7 3.2*  CL 104  --  100  CO2 27  --  31  GLUCOSE 98  --  95  BUN 6  --  8  CREATININE 0.58*  --  0.32*  CALCIUM 8.3*  --  8.4*    ABG: No results for input(s): PHART, PCO2ART, PO2ART, HCO3, O2SAT in the last 168 hours.  Liver Function Tests: No results for input(s): AST, ALT, ALKPHOS, BILITOT, PROT, ALBUMIN in the last 168 hours. No results for input(s): LIPASE, AMYLASE in the last 168 hours. No results for input(s): AMMONIA in the last 168 hours.  CBC: Recent Labs  Lab 02/10/21 0339 02/14/21 0706  WBC 11.7* 16.3*  HGB 8.9* 9.9*  HCT 27.4* 31.0*  MCV 87.5 89.3  PLT 349 466*    Cardiac Enzymes: No results for input(s): CKTOTAL, CKMB, CKMBINDEX, TROPONINI in the last 168 hours.  BNP (last 3 results) No results for input(s): BNP in the last 8760 hours.  ProBNP (last 3  results) No results for input(s): PROBNP in the last 8760 hours.  Radiological Exams: DG CHEST PORT 1 VIEW  Result Date: 02/13/2021 CLINICAL DATA:  PICC line placement EXAM: PORTABLE CHEST 1 VIEW COMPARISON:  02/13/2021 FINDINGS: Single frontal view of the chest demonstrates stable tracheostomy tube. Interval placement of a left-sided PICC tip overlying the superior vena cava. The cardiac silhouette is stable. Persistent bibasilar airspace disease, right greater than left. No effusion or pneumothorax. IMPRESSION: 1. Left-sided PICC, tip overlying SVC. 2. Persistent basilar predominant airspace disease, right greater than left. Electronically Signed   By: Randa Ngo M.D.   On: 02/13/2021 16:59   DG Chest Port 1 View  Result Date: 02/13/2021 CLINICAL DATA:  Status post PICC line placement. EXAM: PORTABLE CHEST 1 VIEW COMPARISON:  February 05, 2021 FINDINGS: A right-sided PICC line is seen. Its distal end loops upon itself with its distal tip seen overlying the mid right scapula. Stable tracheostomy tube positioning is noted. Mild to moderate severity bilateral infiltrates are seen. This is decreased in severity when compared to the prior study. There is no evidence of a pleural effusion or pneumothorax. The heart size and mediastinal contours are within normal limits. The visualized skeletal structures are unremarkable. IMPRESSION: 1. Mild to moderate severity bilateral infiltrates, decreased in severity when compared to the prior study. 2. Right-sided PICC line with its distal tip seen overlying the expected region of the  right axillary vein. Electronically Signed   By: Virgina Norfolk M.D.   On: 02/13/2021 15:43    Assessment/Plan Active Problems:   Acute on chronic respiratory failure with hypoxia (HCC)   Status epilepticus (HCC)   Polysubstance abuse ([place])   Healthcare-associated pneumonia   Acute metabolic encephalopathy   Acute on chronic respiratory failure with hypoxia patient will  be continued on T collar because of secretions not able to do capping right now we will continue with aggressive pulmonary toilet Status epilepticus no change we will continue to monitor along closely. Healthcare associated pneumonia last chest x-ray showing some improvement we will continue to follow Metabolic encephalopathy no change Polysubstance abuse patient is at baseline   I have personally seen and evaluated the patient, evaluated laboratory and imaging results, formulated the assessment and plan and placed orders. The Patient requires high complexity decision making with multiple systems involvement.  Rounds were done with the Respiratory Therapy Director and Staff therapists and discussed with nursing staff also.  Allyne Gee, MD [facility] Pulmonary Critical Care Medicine Sleep Medicine

## 2021-02-16 LAB — CBC
HCT: 29.4 % — ABNORMAL LOW (ref 39.0–52.0)
Hemoglobin: 9.3 g/dL — ABNORMAL LOW (ref 13.0–17.0)
MCH: 28 pg (ref 26.0–34.0)
MCHC: 31.6 g/dL (ref 30.0–36.0)
MCV: 88.6 fL (ref 80.0–100.0)
Platelets: 466 10*3/uL — ABNORMAL HIGH (ref 150–400)
RBC: 3.32 MIL/uL — ABNORMAL LOW (ref 4.22–5.81)
RDW: 17.2 % — ABNORMAL HIGH (ref 11.5–15.5)
WBC: 7.6 10*3/uL (ref 4.0–10.5)
nRBC: 0 % (ref 0.0–0.2)

## 2021-02-16 LAB — BASIC METABOLIC PANEL
Anion gap: 7 (ref 5–15)
BUN: <5 mg/dL — ABNORMAL LOW (ref 6–20)
CO2: 29 mmol/L (ref 22–32)
Calcium: 8.3 mg/dL — ABNORMAL LOW (ref 8.9–10.3)
Chloride: 102 mmol/L (ref 98–111)
Creatinine, Ser: 0.55 mg/dL — ABNORMAL LOW (ref 0.61–1.24)
GFR, Estimated: >60 mL/min (ref >60)
Glucose, Bld: 96 mg/dL (ref 70–99)
Potassium: 3.1 mmol/L — ABNORMAL LOW (ref 3.5–5.1)
Sodium: 138 mmol/L (ref 135–145)

## 2021-02-16 LAB — POTASSIUM: Potassium: 3.1 mmol/L — ABNORMAL LOW (ref 3.5–5.1)

## 2021-02-16 NOTE — Progress Notes (Signed)
Pulmonary [place]  PROGRESS NOTE     Luis Hunter  VQX:[id number]  DOB: December 23, 1975   DOA: 01/16/2021  Referring Physician: Satira Sark, MD  HPI: Luis Hunter is a 45 y.o. male being followed for ventilator/airway/oxygen weaning Acute on Chronic Respiratory Failure.  Patient is on T collar secretions are somewhat better.  Chest x-ray had also been showing some improvement  Medications: Reviewed on Rounds  Physical Exam:  Vitals: Temperature is 97.7 pulse 82 respiratory 20 blood pressure is 137/78 saturations 100%  Ventilator Settings on T collar FiO2 28%  General: Comfortable at this time Neck: supple Cardiovascular: no malignant arrhythmias Respiratory: No rhonchi very coarse breath sounds Skin: no rash seen on limited exam Musculoskeletal: No gross abnormality Psychiatric:unable to assess Neurologic:no involuntary movements         Lab Data:   Basic Metabolic Panel: Recent Labs  Lab 02/10/21 0339 02/11/21 0353 02/14/21 0706  NA 138  --  139  K 3.0* 3.7 3.2*  CL 104  --  100  CO2 27  --  31  GLUCOSE 98  --  95  BUN 6  --  8  CREATININE 0.58*  --  0.32*  CALCIUM 8.3*  --  8.4*    ABG: No results for input(s): PHART, PCO2ART, PO2ART, HCO3, O2SAT in the last 168 hours.  Liver Function Tests: No results for input(s): AST, ALT, ALKPHOS, BILITOT, PROT, ALBUMIN in the last 168 hours. No results for input(s): LIPASE, AMYLASE in the last 168 hours. No results for input(s): AMMONIA in the last 168 hours.  CBC: Recent Labs  Lab 02/10/21 0339 02/14/21 0706  WBC 11.7* 16.3*  HGB 8.9* 9.9*  HCT 27.4* 31.0*  MCV 87.5 89.3  PLT 349 466*    Cardiac Enzymes: No results for input(s): CKTOTAL, CKMB, CKMBINDEX, TROPONINI in the last 168 hours.  BNP (last 3 results) No results for input(s): BNP in the last 8760 hours.  ProBNP (last 3 results) No results for input(s): PROBNP  in the last 8760 hours.  Radiological Exams: No results found.  Assessment/Plan Active Problems:   Acute on chronic respiratory failure with hypoxia (HCC)   Status epilepticus (HCC)   Polysubstance abuse (HCC)   Healthcare-associated pneumonia   Acute metabolic encephalopathy   Acute on chronic respiratory failure with hypoxia plan is going to be to continue with the T collar on 28% FiO2 secretions are improving we will try to use PMV Status epilepticus no change we will continue to follow Polysubstance abuse no change we will continue with supportive care Healthcare associated pneumonia treated Metabolic encephalopathy no change we will continue to monitor closely   I have personally seen and evaluated the patient, evaluated laboratory and imaging results, formulated the assessment and plan and placed orders. The Patient requires high complexity decision making with multiple systems involvement.  Rounds were done with the Respiratory Therapy Director and Staff therapists and discussed with nursing staff also.  Allyne Gee, MD [facility] Pulmonary Critical Care Medicine Sleep Medicine

## 2021-02-17 LAB — COMPREHENSIVE METABOLIC PANEL
ALT: 36 U/L (ref 0–44)
AST: 34 U/L (ref 15–41)
Albumin: 2.4 g/dL — ABNORMAL LOW (ref 3.5–5.0)
Alkaline Phosphatase: 63 U/L (ref 38–126)
Anion gap: 6 (ref 5–15)
BUN: 5 mg/dL — ABNORMAL LOW (ref 6–20)
CO2: 30 mmol/L (ref 22–32)
Calcium: 8.6 mg/dL — ABNORMAL LOW (ref 8.9–10.3)
Chloride: 102 mmol/L (ref 98–111)
Creatinine, Ser: 0.49 mg/dL — ABNORMAL LOW (ref 0.61–1.24)
GFR, Estimated: >60 mL/min (ref >60)
Glucose, Bld: 101 mg/dL — ABNORMAL HIGH (ref 70–99)
Potassium: 3.3 mmol/L — ABNORMAL LOW (ref 3.5–5.1)
Sodium: 138 mmol/L (ref 135–145)
Total Bilirubin: 0.5 mg/dL (ref 0.3–1.2)
Total Protein: 6.1 g/dL — ABNORMAL LOW (ref 6.5–8.1)

## 2021-02-17 LAB — MAGNESIUM: Magnesium: 1.7 mg/dL (ref 1.7–2.4)

## 2021-02-17 NOTE — Progress Notes (Signed)
Pulmonary [place]  PROGRESS NOTE     Luis Hunter  [id number]:[id number]  DOB: 1976/03/09   DOA: 01/16/2021  Referring Physician: Satira Sark, MD  HPI: Luis Hunter is a 45 y.o. male being followed for ventilator/airway/oxygen weaning Acute on Chronic Respiratory Failure.  Comfortable right now without distress no fevers are noted at this time has been on T collar copious secretions  Medications: Reviewed on Rounds  Physical Exam:  Vitals: Temperature is 97.2 pulse 118 respiratory rate is 18 blood pressure 111/70  Ventilator Settings off the ventilator on T collar [place]  General: Comfortable at this time Neck: supple Cardiovascular: no malignant arrhythmias Respiratory: Coarse rhonchi very coarse breath sounds are noted Skin: no rash seen on limited exam Musculoskeletal: No gross abnormality Psychiatric:unable to assess Neurologic:no involuntary movements         Lab Data:   Basic Metabolic Panel: Recent Labs  Lab 02/11/21 0353 02/14/21 0706 02/16/21 0938 02/16/21 2212 02/17/21 0550  NA  --  139 138  --  138  K 3.7 3.2* 3.1* 3.1* 3.3*  CL  --  100 102  --  102  CO2  --  31 29  --  30  GLUCOSE  --  95 96  --  101*  BUN  --  8 <5*  --  5*  CREATININE  --  0.32* 0.55*  --  0.49*  CALCIUM  --  8.4* 8.3*  --  8.6*    ABG: No results for input(s): PHART, PCO2ART, PO2ART, HCO3, O2SAT in the last 168 hours.  Liver Function Tests: Recent Labs  Lab 02/17/21 0550  AST 34  ALT 36  ALKPHOS 63  BILITOT 0.5  PROT 6.1*  ALBUMIN 2.4*   No results for input(s): LIPASE, AMYLASE in the last 168 hours. No results for input(s): AMMONIA in the last 168 hours.  CBC: Recent Labs  Lab 02/14/21 0706 02/16/21 0938  WBC 16.3* 7.6  HGB 9.9* 9.3*  HCT 31.0* 29.4*  MCV 89.3 88.6  PLT 466* 466*    Cardiac Enzymes: No results for input(s): CKTOTAL, CKMB, CKMBINDEX, TROPONINI in the  last 168 hours.  BNP (last 3 results) No results for input(s): BNP in the last 8760 hours.  ProBNP (last 3 results) No results for input(s): PROBNP in the last 8760 hours.  Radiological Exams: No results found.  Assessment/Plan Active Problems:   Acute on chronic respiratory failure with hypoxia (HCC)   Status epilepticus (HCC)   Polysubstance abuse (HCC)   Healthcare-associated pneumonia   Acute metabolic encephalopathy   Acute on chronic respiratory failure with hypoxia at this time continue OT collar secretions are still limiting factor as far as being able to advance weaning Epilepsy no active seizure noted Polysubstance abuse patient is at baseline Healthcare associated pneumonia treated slow to improve Metabolic encephalopathy no change   I have personally seen and evaluated the patient, evaluated laboratory and imaging results, formulated the assessment and plan and placed orders. The Patient requires high complexity decision making with multiple systems involvement.  Rounds were done with the Respiratory Therapy Director and Staff therapists and discussed with nursing staff also.  Allyne Gee, MD [facility] Pulmonary Critical Care Medicine Sleep Medicine

## 2021-02-19 NOTE — Progress Notes (Signed)
Pulmonary [place]  PROGRESS NOTE     Luis Hunter  [id number]:[id number]  DOB: Apr 08, 1976   DOA: 01/16/2021  Referring Physician: Satira Sark, MD  HPI: Luis Hunter is a 45 y.o. male being followed for ventilator/airway/oxygen weaning Acute on Chronic Respiratory Failure.  Patient's had a low-grade fever otherwise is on the T-bar resting comfortably right now on 28% FiO2.  Medications: Reviewed on Rounds  Physical Exam:  Vitals: Temperature is 99.1 pulse 94 respiratory rate 12 blood pressure is 123/71 saturations 94%  Ventilator Settings on T collar FiO2 of 28%  General: Comfortable at this time Neck: supple Cardiovascular: no malignant arrhythmias Respiratory: No rhonchi very coarse breath sounds Skin: no rash seen on limited exam Musculoskeletal: No gross abnormality Psychiatric:unable to assess Neurologic:no involuntary movements         Lab Data:   Basic Metabolic Panel: Recent Labs  Lab 02/14/21 0706 02/16/21 0938 02/16/21 2212 02/17/21 0550 02/17/21 1005  NA 139 138  --  138  --   K 3.2* 3.1* 3.1* 3.3*  --   CL 100 102  --  102  --   CO2 31 29  --  30  --   GLUCOSE 95 96  --  101*  --   BUN 8 <5*  --  5*  --   CREATININE 0.32* 0.55*  --  0.49*  --   CALCIUM 8.4* 8.3*  --  8.6*  --   MG  --   --   --   --  1.7    ABG: No results for input(s): PHART, PCO2ART, PO2ART, HCO3, O2SAT in the last 168 hours.  Liver Function Tests: Recent Labs  Lab 02/17/21 0550  AST 34  ALT 36  ALKPHOS 63  BILITOT 0.5  PROT 6.1*  ALBUMIN 2.4*   No results for input(s): LIPASE, AMYLASE in the last 168 hours. No results for input(s): AMMONIA in the last 168 hours.  CBC: Recent Labs  Lab 02/14/21 0706 02/16/21 0938  WBC 16.3* 7.6  HGB 9.9* 9.3*  HCT 31.0* 29.4*  MCV 89.3 88.6  PLT 466* 466*    Cardiac Enzymes: No results for input(s): CKTOTAL, CKMB, CKMBINDEX, TROPONINI in the  last 168 hours.  BNP (last 3 results) No results for input(s): BNP in the last 8760 hours.  ProBNP (last 3 results) No results for input(s): PROBNP in the last 8760 hours.  Radiological Exams: No results found.  Assessment/Plan Active Problems:   Acute on chronic respiratory failure with hypoxia (HCC)   Status epilepticus (HCC)   Polysubstance abuse ([place])   Healthcare-associated pneumonia   Acute metabolic encephalopathy   Acute on chronic respiratory failure with hypoxia we will continue with the T-piece titrate oxygen as tolerated continue pulmonary toilet. Status epilepticus no seizures noted at this time. Polysubstance abuse patient is at baseline Healthcare associated pneumonia treated continue to follow along. Metabolic encephalopathy no change we will continue to monitor closely.   I have personally seen and evaluated the patient, evaluated laboratory and imaging results, formulated the assessment and plan and placed orders. The Patient requires high complexity decision making with multiple systems involvement.  Rounds were done with the Respiratory Therapy Director and Staff therapists and discussed with nursing staff also.  Allyne Gee, MD [facility] Pulmonary Critical Care Medicine Sleep Medicine

## 2021-02-20 LAB — BASIC METABOLIC PANEL
Anion gap: 7 (ref 5–15)
BUN: 9 mg/dL (ref 6–20)
CO2: 31 mmol/L (ref 22–32)
Calcium: 8.7 mg/dL — ABNORMAL LOW (ref 8.9–10.3)
Chloride: 101 mmol/L (ref 98–111)
Creatinine, Ser: 0.52 mg/dL — ABNORMAL LOW (ref 0.61–1.24)
GFR, Estimated: >60 mL/min (ref >60)
Glucose, Bld: 108 mg/dL — ABNORMAL HIGH (ref 70–99)
Potassium: 3.3 mmol/L — ABNORMAL LOW (ref 3.5–5.1)
Sodium: 139 mmol/L (ref 135–145)

## 2021-02-20 LAB — CBC
HCT: 32.3 % — ABNORMAL LOW (ref 39.0–52.0)
Hemoglobin: 10 g/dL — ABNORMAL LOW (ref 13.0–17.0)
MCH: 28 pg (ref 26.0–34.0)
MCHC: 31 g/dL (ref 30.0–36.0)
MCV: 90.5 fL (ref 80.0–100.0)
Platelets: 403 10*3/uL — ABNORMAL HIGH (ref 150–400)
RBC: 3.57 MIL/uL — ABNORMAL LOW (ref 4.22–5.81)
RDW: 17.5 % — ABNORMAL HIGH (ref 11.5–15.5)
WBC: 7.1 10*3/uL (ref 4.0–10.5)
nRBC: 0 % (ref 0.0–0.2)

## 2021-02-20 LAB — MAGNESIUM: Magnesium: 1.7 mg/dL (ref 1.7–2.4)

## 2021-02-20 NOTE — Progress Notes (Signed)
Pulmonary [place]  PROGRESS NOTE     Luis Hunter  [id number]  DOB: 1975-06-10   DOA: 01/16/2021  Referring Physician: Satira Sark, MD  HPI: Luis Hunter is a 45 y.o. male being followed for ventilator/airway/oxygen weaning Acute on Chronic Respiratory Failure.  Patient currently is on T collar copious secretions has been on 28% FiO2  Medications: Reviewed on Rounds  Physical Exam:  Vitals: Temperature is 96.7 pulse 72 respiratory rate 18 blood pressure is 107/67 saturations 96%  Ventilator Settings on T collar FiO2 28%  General: Comfortable at this time Neck: supple Cardiovascular: no malignant arrhythmias Respiratory: Scattered rhonchi expansion is equal Skin: no rash seen on limited exam Musculoskeletal: No gross abnormality Psychiatric:unable to assess Neurologic:no involuntary movements         Lab Data:   Basic Metabolic Panel: Recent Labs  Lab 02/14/21 0706 02/16/21 0938 02/16/21 2212 02/17/21 0550 02/17/21 1005 02/20/21 0431  NA 139 138  --  138  --  139  K 3.2* 3.1* 3.1* 3.3*  --  3.3*  CL 100 102  --  102  --  101  CO2 31 29  --  30  --  31  GLUCOSE 95 96  --  101*  --  108*  BUN 8 <5*  --  5*  --  9  CREATININE 0.32* 0.55*  --  0.49*  --  0.52*  CALCIUM 8.4* 8.3*  --  8.6*  --  8.7*  MG  --   --   --   --  1.7 1.7    ABG: No results for input(s): PHART, PCO2ART, PO2ART, HCO3, O2SAT in the last 168 hours.  Liver Function Tests: Recent Labs  Lab 02/17/21 0550  AST 34  ALT 36  ALKPHOS 63  BILITOT 0.5  PROT 6.1*  ALBUMIN 2.4*   No results for input(s): LIPASE, AMYLASE in the last 168 hours. No results for input(s): AMMONIA in the last 168 hours.  CBC: Recent Labs  Lab 02/14/21 0706 02/16/21 0938 02/20/21 0431  WBC 16.3* 7.6 7.1  HGB 9.9* 9.3* 10.0*  HCT 31.0* 29.4* 32.3*  MCV 89.3 88.6 90.5  PLT 466* 466* 403*    Cardiac Enzymes: No  results for input(s): CKTOTAL, CKMB, CKMBINDEX, TROPONINI in the last 168 hours.  BNP (last 3 results) No results for input(s): BNP in the last 8760 hours.  ProBNP (last 3 results) No results for input(s): PROBNP in the last 8760 hours.  Radiological Exams: No results found.  Assessment/Plan Active Problems:   Acute on chronic respiratory failure with hypoxia (HCC)   Status epilepticus (HCC)   Polysubstance abuse (HCC)   Healthcare-associated pneumonia   Acute metabolic encephalopathy   Acute on chronic respiratory failure with hypoxia we will continue with the T collar patient is on 28% FiO2 at this time.  Secretions are still very copious limiting factor Status epilepticus no sign of active seizures right now Polysubstance abuse patient is at baseline Healthcare associated pneumonia treated we will continue to follow along Metabolic encephalopathy no change   I have personally seen and evaluated the patient, evaluated laboratory and imaging results, formulated the assessment and plan and placed orders. The Patient requires high complexity decision making with multiple systems involvement.  Rounds were done with the Respiratory Therapy Director and Staff therapists and discussed with nursing staff also.  Allyne Gee, MD [facility] Pulmonary Critical Care Medicine Sleep Medicine

## 2021-02-21 LAB — MAGNESIUM: Magnesium: 1.8 mg/dL (ref 1.7–2.4)

## 2021-02-21 LAB — POTASSIUM: Potassium: 3.5 mmol/L (ref 3.5–5.1)

## 2021-02-21 NOTE — Progress Notes (Signed)
Pulmonary [place]  PROGRESS NOTE     Luis Hunter  [id number]:[id number]  DOB: 05/23/1975   DOA: 01/16/2021  Referring Physician: Satira Sark, MD  HPI: Luis Hunter is a 45 y.o. male being followed for ventilator/airway/oxygen weaning Acute on Chronic Respiratory Failure.  At this time patient is on T collar has been on 28% FiO2 good saturations are noted secretions are still very copious  Medications: Reviewed on Rounds  Physical Exam:  Vitals: Temperature is 97.8 pulse 79 respiratory is 20 blood pressure is 116/78 saturations 100%  Ventilator Settings off the ventilator on T collar  General: Comfortable at this time Neck: supple Cardiovascular: no malignant arrhythmias Respiratory: Coarse rhonchi noted Skin: no rash seen on limited exam Musculoskeletal: No gross abnormality Psychiatric:unable to assess Neurologic:no involuntary movements         Lab Data:   Basic Metabolic Panel: Recent Labs  Lab 02/16/21 0938 02/16/21 2212 02/17/21 0550 02/17/21 1005 02/20/21 0431 02/21/21 0403  NA 138  --  138  --  139  --   K 3.1* 3.1* 3.3*  --  3.3* 3.5  CL 102  --  102  --  101  --   CO2 29  --  30  --  31  --   GLUCOSE 96  --  101*  --  108*  --   BUN <5*  --  5*  --  9  --   CREATININE 0.55*  --  0.49*  --  0.52*  --   CALCIUM 8.3*  --  8.6*  --  8.7*  --   MG  --   --   --  1.7 1.7 1.8    ABG: No results for input(s): PHART, PCO2ART, PO2ART, HCO3, O2SAT in the last 168 hours.  Liver Function Tests: Recent Labs  Lab 02/17/21 0550  AST 34  ALT 36  ALKPHOS 63  BILITOT 0.5  PROT 6.1*  ALBUMIN 2.4*   No results for input(s): LIPASE, AMYLASE in the last 168 hours. No results for input(s): AMMONIA in the last 168 hours.  CBC: Recent Labs  Lab 02/16/21 0938 02/20/21 0431  WBC 7.6 7.1  HGB 9.3* 10.0*  HCT 29.4* 32.3*  MCV 88.6 90.5  PLT 466* 403*    Cardiac Enzymes: No  results for input(s): CKTOTAL, CKMB, CKMBINDEX, TROPONINI in the last 168 hours.  BNP (last 3 results) No results for input(s): BNP in the last 8760 hours.  ProBNP (last 3 results) No results for input(s): PROBNP in the last 8760 hours.  Radiological Exams: No results found.  Assessment/Plan Active Problems:   Acute on chronic respiratory failure with hypoxia (HCC)   Status epilepticus (HCC)   Polysubstance abuse ([place])   Healthcare-associated pneumonia   Acute metabolic encephalopathy   Acute on chronic respiratory failure with hypoxia we will continue with the T-piece titrate the oxygen as tolerated continue with pulmonary toilet.  Secretions are limiting for ability to wean Status epilepticus no change we will continue to monitor along closely Healthcare associated pneumonia treated slow improvement Status epilepticus no changes noted overall Metabolic encephalopathy slow improvement   I have personally seen and evaluated the patient, evaluated laboratory and imaging results, formulated the assessment and plan and placed orders. The Patient requires high complexity decision making with multiple systems involvement.  Rounds were done with the Respiratory Therapy Director and Staff therapists and discussed with nursing staff also.  Allyne Gee, MD [facility] Pulmonary Critical Care Medicine Sleep Medicine

## 2021-02-22 NOTE — Progress Notes (Signed)
Pulmonary Luis [place]  PROGRESS NOTE     Luis Hunter  EAV:[id number]  DOB: 05-17-1975   DOA: 01/16/2021  Referring Physician: Satira Sark, MD  HPI: Luis Hunter is a 45 y.o. male being followed for ventilator/airway/oxygen weaning Acute on Chronic Respiratory Failure.  Patient is on T collar with the PMV currently requiring 28% FiO2  Medications: Reviewed on Rounds  Physical Exam:  Vitals: Temperature is 97.8 pulse 80 respiratory rate is 20 blood pressure is 111/79 saturations 100%  Ventilator Settings on T collar FiO2 28%  General: Comfortable at this time Neck: supple Cardiovascular: no malignant arrhythmias Respiratory: No rhonchi very coarse breath sounds Skin: no rash seen on limited exam Musculoskeletal: No gross abnormality Psychiatric:unable to assess Neurologic:no involuntary movements         Lab Data:   Basic Metabolic Panel: Recent Labs  Lab 02/16/21 0938 02/16/21 2212 02/17/21 0550 02/17/21 1005 02/20/21 0431 02/21/21 0403  NA 138  --  138  --  139  --   K 3.1* 3.1* 3.3*  --  3.3* 3.5  CL 102  --  102  --  101  --   CO2 29  --  30  --  31  --   GLUCOSE 96  --  101*  --  108*  --   BUN <5*  --  5*  --  9  --   CREATININE 0.55*  --  0.49*  --  0.52*  --   CALCIUM 8.3*  --  8.6*  --  8.7*  --   MG  --   --   --  1.7 1.7 1.8    ABG: No results for input(s): PHART, PCO2ART, PO2ART, HCO3, O2SAT in the last 168 hours.  Liver Function Tests: Recent Labs  Lab 02/17/21 0550  AST 34  ALT 36  ALKPHOS 63  BILITOT 0.5  PROT 6.1*  ALBUMIN 2.4*   No results for input(s): LIPASE, AMYLASE in the last 168 hours. No results for input(s): AMMONIA in the last 168 hours.  CBC: Recent Labs  Lab 02/16/21 0938 02/20/21 0431  WBC 7.6 7.1  HGB 9.3* 10.0*  HCT 29.4* 32.3*  MCV 88.6 90.5  PLT 466* 403*    Cardiac Enzymes: No results for input(s): CKTOTAL, CKMB,  CKMBINDEX, TROPONINI in the last 168 hours.  BNP (last 3 results) No results for input(s): BNP in the last 8760 hours.  ProBNP (last 3 results) No results for input(s): PROBNP in the last 8760 hours.  Radiological Exams: No results found.  Assessment/Plan Active Problems:   Acute on chronic respiratory failure with hypoxia (HCC)   Status epilepticus (HCC)   Polysubstance abuse ([place])   Healthcare-associated pneumonia   Acute metabolic encephalopathy   Acute on chronic respiratory failure with hypoxia we will continue with T-piece titrate oxygen as tolerated continue secretion management pulmonary toilet. Status epilepticus no change we will continue to follow along Polysubstance abuse supportive care Healthcare associated pneumonia treated clinically improving Metabolic encephalopathy supportive care   I have personally seen and evaluated the patient, evaluated laboratory and imaging results, formulated the assessment and plan and placed orders. The Patient requires high complexity decision making with multiple systems involvement.  Rounds were done with the Respiratory Therapy Director and Staff therapists and discussed with nursing staff also.  Allyne Gee, MD [facility] Pulmonary Critical Care Medicine Sleep Medicine

## 2021-02-23 NOTE — Progress Notes (Signed)
[name]  PROGRESS NOTE     Luis Hunter  [id number]:[id number]  DOB: 02-15-1976   DOA: 01/16/2021  Referring Physician: Satira Sark, MD  HPI: Luis Hunter is a 45 y.o. male being followed for ventilator/airway/oxygen weaning Acute on Chronic Respiratory Failure.  Patient is currently on T collar has been on 28% FiO2 using PMV good saturations are noted  Medications: Reviewed on Rounds  Physical Exam:  Vitals: Temperature is 97.3 pulse 68 respiratory rate 16 blood pressure is 101/68 saturations 98%  Ventilator Settings on T collar currently at 28% FiO2  General: Comfortable at this time Neck: supple Cardiovascular: no malignant arrhythmias Respiratory: No rhonchi no rales noted at this time Skin: no rash seen on limited exam Musculoskeletal: No gross abnormality Psychiatric:unable to assess Neurologic:no involuntary movements         Lab Data:   Basic Metabolic Panel: Recent Labs  Lab 02/16/21 2212 02/17/21 0550 02/17/21 1005 02/20/21 0431 02/21/21 0403  NA  --  138  --  139  --   K 3.1* 3.3*  --  3.3* 3.5  CL  --  102  --  101  --   CO2  --  30  --  31  --   GLUCOSE  --  101*  --  108*  --   BUN  --  5*  --  9  --   CREATININE  --  0.49*  --  0.52*  --   CALCIUM  --  8.6*  --  8.7*  --   MG  --   --  1.7 1.7 1.8    ABG: No results for input(s): PHART, PCO2ART, PO2ART, HCO3, O2SAT in the last 168 hours.  Liver Function Tests: Recent Labs  Lab 02/17/21 0550  AST 34  ALT 36  ALKPHOS 63  BILITOT 0.5  PROT 6.1*  ALBUMIN 2.4*   No results for input(s): LIPASE, AMYLASE in the last 168 hours. No results for input(s): AMMONIA in the last 168 hours.  CBC: Recent Labs  Lab 02/20/21 0431  WBC 7.1  HGB 10.0*  HCT 32.3*  MCV 90.5  PLT 403*    Cardiac Enzymes: No results for input(s): CKTOTAL, CKMB, CKMBINDEX, TROPONINI in the last 168 hours.  BNP (last 3  results) No results for input(s): BNP in the last 8760 hours.  ProBNP (last 3 results) No results for input(s): PROBNP in the last 8760 hours.  Radiological Exams: No results found.  Assessment/Plan Active Problems:   Acute on chronic respiratory failure with hypoxia (HCC)   Status epilepticus (HCC)   Polysubstance abuse ([place])   Healthcare-associated pneumonia   Acute metabolic encephalopathy   Acute on chronic respiratory failure hypoxia we will continue with weaning on the T-piece.  Titrate oxygen as tolerated continue pulmonary toilet.  Patient's been tolerating PMV again Status epilepticus no active seizures noted at this time we will continue to follow along closely. Polysubstance abuse no change continue with supportive care. Healthcare associated pneumonia treated continue with supportive care. Acute metabolic encephalopathy no change patient is at baseline   I have personally seen and evaluated the patient, evaluated laboratory and imaging results, formulated the assessment and plan and placed orders. The Patient requires high complexity decision making with multiple systems involvement.  Rounds were done with the Respiratory Therapy Director and Staff therapists and discussed with nursing staff also.  Allyne Gee, MD [facility] Pulmonary Critical  Care Medicine Sleep Medicine

## 2021-02-24 LAB — BASIC METABOLIC PANEL
Anion gap: 8 (ref 5–15)
BUN: 12 mg/dL (ref 6–20)
CO2: 28 mmol/L (ref 22–32)
Calcium: 9.1 mg/dL (ref 8.9–10.3)
Chloride: 102 mmol/L (ref 98–111)
Creatinine, Ser: 0.57 mg/dL — ABNORMAL LOW (ref 0.61–1.24)
GFR, Estimated: >60 mL/min (ref >60)
Glucose, Bld: 122 mg/dL — ABNORMAL HIGH (ref 70–99)
Potassium: 3.6 mmol/L (ref 3.5–5.1)
Sodium: 138 mmol/L (ref 135–145)

## 2021-02-24 LAB — CBC
HCT: 37.8 % — ABNORMAL LOW (ref 39.0–52.0)
Hemoglobin: 11.8 g/dL — ABNORMAL LOW (ref 13.0–17.0)
MCH: 28.2 pg (ref 26.0–34.0)
MCHC: 31.2 g/dL (ref 30.0–36.0)
MCV: 90.2 fL (ref 80.0–100.0)
Platelets: 299 10*3/uL (ref 150–400)
RBC: 4.19 MIL/uL — ABNORMAL LOW (ref 4.22–5.81)
RDW: 17 % — ABNORMAL HIGH (ref 11.5–15.5)
WBC: 8.6 10*3/uL (ref 4.0–10.5)
nRBC: 0 % (ref 0.0–0.2)

## 2021-02-24 LAB — MAGNESIUM: Magnesium: 1.9 mg/dL (ref 1.7–2.4)

## 2021-02-24 NOTE — Progress Notes (Signed)
Pulmonary [place]  PROGRESS NOTE     YOCHANAN EDDLEMAN  [id number]:[id number]  DOB: 1975-08-11   DOA: 01/16/2021  Referring Physician: Satira Sark, MD  HPI: RHETT MUTSCHLER is a 45 y.o. male being followed for ventilator/airway/oxygen weaning Acute on Chronic Respiratory Failure.  Patient is off the ventilator on T collar with good saturations in the secretions.  Fairly moderate.  Medications: Reviewed on Rounds  Physical Exam:  Vitals: Temperature is 97.3 pulse 69 respiratory is 18 blood pressure is one 1/70 saturations 96%  Ventilator Settings on T collar  General: Comfortable at this time Neck: supple Cardiovascular: no malignant arrhythmias Respiratory: No rhonchi very coarse breath sounds Skin: no rash seen on limited exam Musculoskeletal: No gross abnormality Psychiatric:unable to assess Neurologic:no involuntary movements         Lab Data:   Basic Metabolic Panel: Recent Labs  Lab 02/20/21 0431 02/21/21 0403 02/24/21 0341  NA 139  --  138  K 3.3* 3.5 3.6  CL 101  --  102  CO2 31  --  28  GLUCOSE 108*  --  122*  BUN 9  --  12  CREATININE 0.52*  --  0.57*  CALCIUM 8.7*  --  9.1  MG 1.7 1.8 1.9    ABG: No results for input(s): PHART, PCO2ART, PO2ART, HCO3, O2SAT in the last 168 hours.  Liver Function Tests: No results for input(s): AST, ALT, ALKPHOS, BILITOT, PROT, ALBUMIN in the last 168 hours. No results for input(s): LIPASE, AMYLASE in the last 168 hours. No results for input(s): AMMONIA in the last 168 hours.  CBC: Recent Labs  Lab 02/20/21 0431 02/24/21 0341  WBC 7.1 8.6  HGB 10.0* 11.8*  HCT 32.3* 37.8*  MCV 90.5 90.2  PLT 403* 299    Cardiac Enzymes: No results for input(s): CKTOTAL, CKMB, CKMBINDEX, TROPONINI in the last 168 hours.  BNP (last 3 results) No results for input(s): BNP in the last 8760 hours.  ProBNP (last 3 results) No results for input(s):  PROBNP in the last 8760 hours.  Radiological Exams: No results found.  Assessment/Plan Active Problems:   Acute on chronic respiratory failure with hypoxia (HCC)   Status epilepticus (HCC)   Polysubstance abuse (HCC)   Healthcare-associated pneumonia   Acute metabolic encephalopathy   Acute on chronic respiratory failure hypoxia patient is on T collar at this time doing well.  Continue with the T-piece titrate oxygen as tolerated continue pulmonary toilet. Status epilepticus no seizures are noted at this time Polysubstance abuse patient is at baseline Healthcare associated pneumonia treated Metabolic encephalopathy no change   I have personally seen and evaluated the patient, evaluated laboratory and imaging results, formulated the assessment and plan and placed orders. The Patient requires high complexity decision making with multiple systems involvement.  Rounds were done with the Respiratory Therapy Director and Staff therapists and discussed with nursing staff also.  Allyne Gee, MD [facility] Pulmonary Critical Care Medicine Sleep Medicine

## 2021-02-26 ENCOUNTER — Other Ambulatory Visit (HOSPITAL_COMMUNITY): Payer: Medicare HMO

## 2021-02-26 NOTE — Progress Notes (Signed)
Pulmonary [place]  PROGRESS NOTE     Luis Hunter  [id number]  DOB: [birth year]   DOA: 01/16/2021  Referring Physician: Satira Sark, MD  HPI: Luis Hunter is a 46 y.o. male being followed for ventilator/airway/oxygen weaning Acute on Chronic Respiratory Failure.  Patient is afebrile right now on T-piece patient has good saturations noted  Medications: Reviewed on Rounds  Physical Exam:  Vitals: Temperature is 97.2 pulse 61 respiratory is 18 blood pressure is 111/54 saturations 94%  Ventilator Settings on T collar on 35% FiO2  General: Comfortable at this time Neck: supple Cardiovascular: no malignant arrhythmias Respiratory: No rhonchi no rales are noted at this time Skin: no rash seen on limited exam Musculoskeletal: No gross abnormality Psychiatric:unable to assess Neurologic:no involuntary movements         Lab Data:   Basic Metabolic Panel: Recent Labs  Lab 02/20/21 0431 02/21/21 0403 02/24/21 0341  NA 139  --  138  K 3.3* 3.5 3.6  CL 101  --  102  CO2 31  --  28  GLUCOSE 108*  --  122*  BUN 9  --  12  CREATININE 0.52*  --  0.57*  CALCIUM 8.7*  --  9.1  MG 1.7 1.8 1.9    ABG: No results for input(s): PHART, PCO2ART, PO2ART, HCO3, O2SAT in the last 168 hours.  Liver Function Tests: No results for input(s): AST, ALT, ALKPHOS, BILITOT, PROT, ALBUMIN in the last 168 hours. No results for input(s): LIPASE, AMYLASE in the last 168 hours. No results for input(s): AMMONIA in the last 168 hours.  CBC: Recent Labs  Lab 02/20/21 0431 02/24/21 0341  WBC 7.1 8.6  HGB 10.0* 11.8*  HCT 32.3* 37.8*  MCV 90.5 90.2  PLT 403* 299    Cardiac Enzymes: No results for input(s): CKTOTAL, CKMB, CKMBINDEX, TROPONINI in the last 168 hours.  BNP (last 3 results) No results for input(s): BNP in the last 8760 hours.  ProBNP (last 3 results) No results for input(s): PROBNP in  the last 8760 hours.  Radiological Exams: No results found.  Assessment/Plan Active Problems:   Acute on chronic respiratory failure with hypoxia (HCC)   Status epilepticus (HCC)   Polysubstance abuse (HCC)   Healthcare-associated pneumonia   Acute metabolic encephalopathy   Acute on chronic respiratory failure hypoxia we will continue with the T collar secretions are moderate continue pulmonary toilet we will continue to follow along Status epilepticus no sign of active seizures at this time we will continue with supportive care Polysubstance abuse at baseline Healthcare associated pneumonia treated slow improvement Metabolic encephalopathy overall no change   I have personally seen and evaluated the patient, evaluated laboratory and imaging results, formulated the assessment and plan and placed orders. The Patient requires high complexity decision making with multiple systems involvement.  Rounds were done with the Respiratory Therapy Director and Staff therapists and discussed with nursing staff also.  Allyne Gee, MD [facility] Pulmonary Critical Care Medicine Sleep Medicine

## 2021-02-27 NOTE — Progress Notes (Signed)
Pulmonary [place]  PROGRESS NOTE     Luis Hunter  [id number]:[id number]  DOB: 1975-12-09   DOA: 01/16/2021  Referring Physician: Satira Sark, MD  HPI: Luis Hunter is a 45 y.o. male being followed for ventilator/airway/oxygen weaning Acute on Chronic Respiratory Failure.  Patient is afebrile right now resting comfortably without distress.  Remains on the T collar  Medications: Reviewed on Rounds  Physical Exam:  Vitals: Temperature is 97.1 pulse 70 respiratory rate is 18 blood pressure is 91/58 saturations 100%  Ventilator Settings on T collar 28%  General: Comfortable at this time Neck: supple Cardiovascular: no malignant arrhythmias Respiratory: No rhonchi no rales are noted at this time Skin: no rash seen on limited exam Musculoskeletal: No gross abnormality Psychiatric:unable to assess Neurologic:no involuntary movements         Lab Data:   Basic Metabolic Panel: Recent Labs  Lab 02/21/21 0403 02/24/21 0341  NA  --  138  K 3.5 3.6  CL  --  102  CO2  --  28  GLUCOSE  --  122*  BUN  --  12  CREATININE  --  0.57*  CALCIUM  --  9.1  MG 1.8 1.9    ABG: No results for input(s): PHART, PCO2ART, PO2ART, HCO3, O2SAT in the last 168 hours.  Liver Function Tests: No results for input(s): AST, ALT, ALKPHOS, BILITOT, PROT, ALBUMIN in the last 168 hours. No results for input(s): LIPASE, AMYLASE in the last 168 hours. No results for input(s): AMMONIA in the last 168 hours.  CBC: Recent Labs  Lab 02/24/21 0341  WBC 8.6  HGB 11.8*  HCT 37.8*  MCV 90.2  PLT 299    Cardiac Enzymes: No results for input(s): CKTOTAL, CKMB, CKMBINDEX, TROPONINI in the last 168 hours.  BNP (last 3 results) No results for input(s): BNP in the last 8760 hours.  ProBNP (last 3 results) No results for input(s): PROBNP in the last 8760 hours.  Radiological Exams: No results  found.  Assessment/Plan Active Problems:   Acute on chronic respiratory failure with hypoxia (HCC)   Status epilepticus (HCC)   Polysubstance abuse ([place])   Healthcare-associated pneumonia   Acute metabolic encephalopathy   Acute on chronic respiratory failure hypoxia patient was attempted on swallowing assessment yesterday and the patient failed plan is going to be to continue with the T-piece we will continue with secretion management and supportive care. Status epilepticus no sign of active seizures right now Polysubstance abuse patient is at baseline Healthcare associated pneumonia treated we will continue with supportive care Acute metabolic encephalopathy no change   I have personally seen and evaluated the patient, evaluated laboratory and imaging results, formulated the assessment and plan and placed orders. The Patient requires high complexity decision making with multiple systems involvement.  Rounds were done with the Respiratory Therapy Director and Staff therapists and discussed with nursing staff also.  Allyne Gee, MD [facility] Pulmonary Critical Care Medicine Sleep Medicine

## 2021-02-28 LAB — COMPREHENSIVE METABOLIC PANEL
ALT: 30 U/L (ref 0–44)
AST: 24 U/L (ref 15–41)
Albumin: 2.9 g/dL — ABNORMAL LOW (ref 3.5–5.0)
Alkaline Phosphatase: 77 U/L (ref 38–126)
Anion gap: 7 (ref 5–15)
BUN: 12 mg/dL (ref 6–20)
CO2: 28 mmol/L (ref 22–32)
Calcium: 9.3 mg/dL (ref 8.9–10.3)
Chloride: 104 mmol/L (ref 98–111)
Creatinine, Ser: 0.54 mg/dL — ABNORMAL LOW (ref 0.61–1.24)
GFR, Estimated: >60 mL/min (ref >60)
Glucose, Bld: 93 mg/dL (ref 70–99)
Potassium: 3.9 mmol/L (ref 3.5–5.1)
Sodium: 139 mmol/L (ref 135–145)
Total Bilirubin: 0.6 mg/dL (ref 0.3–1.2)
Total Protein: 6.5 g/dL (ref 6.5–8.1)

## 2021-02-28 NOTE — Progress Notes (Signed)
Pulmonary [place]  PROGRESS NOTE     Luis Hunter  CHY:[id number]  DOB: Apr 09, 1976   DOA: 01/16/2021  Referring Physician: Satira Sark, MD  HPI: Luis Hunter is a 45 y.o. male being followed for ventilator/airway/oxygen weaning Acute on Chronic Respiratory Failure.  Patient is resting comfortably right now without distress has been on T collar good saturations are noted.  Secretions are about the same  Medications: Reviewed on Rounds  Physical Exam:  Vitals: Temperature is 95.8 pulse 70 respiratory rate is 20 blood pressure is 93/63 saturations 100%  Ventilator Settings off the ventilator on T collar  General: Comfortable at this time Neck: supple Cardiovascular: no malignant arrhythmias Respiratory: Scattered coarse rhonchi are noted Skin: no rash seen on limited exam Musculoskeletal: No gross abnormality Psychiatric:unable to assess Neurologic:no involuntary movements         Lab Data:   Basic Metabolic Panel: Recent Labs  Lab 02/24/21 0341 02/28/21 0451  NA 138 139  K 3.6 3.9  CL 102 104  CO2 28 28  GLUCOSE 122* 93  BUN 12 12  CREATININE 0.57* 0.54*  CALCIUM 9.1 9.3  MG 1.9  --     ABG: No results for input(s): PHART, PCO2ART, PO2ART, HCO3, O2SAT in the last 168 hours.  Liver Function Tests: Recent Labs  Lab 02/28/21 0451  AST 24  ALT 30  ALKPHOS 77  BILITOT 0.6  PROT 6.5  ALBUMIN 2.9*   No results for input(s): LIPASE, AMYLASE in the last 168 hours. No results for input(s): AMMONIA in the last 168 hours.  CBC: Recent Labs  Lab 02/24/21 0341  WBC 8.6  HGB 11.8*  HCT 37.8*  MCV 90.2  PLT 299    Cardiac Enzymes: No results for input(s): CKTOTAL, CKMB, CKMBINDEX, TROPONINI in the last 168 hours.  BNP (last 3 results) No results for input(s): BNP in the last 8760 hours.  ProBNP (last 3 results) No results for input(s): PROBNP in the last 8760  hours.  Radiological Exams: No results found.  Assessment/Plan Active Problems:   Acute on chronic respiratory failure with hypoxia (HCC)   Status epilepticus (HCC)   Polysubstance abuse ([place])   Healthcare-associated pneumonia   Acute metabolic encephalopathy   Acute on chronic respiratory failure hypoxia we will continue with the T-piece needs to remain in place because of excessive secretions Status epilepticus no change we will continue to follow along closely. Polysubstance abuse patient is at baseline Healthcare associated pneumonia treated Metabolic encephalopathy no change overall   I have personally seen and evaluated the patient, evaluated laboratory and imaging results, formulated the assessment and plan and placed orders. The Patient requires high complexity decision making with multiple systems involvement.  Rounds were done with the Respiratory Therapy Director and Staff therapists and discussed with nursing staff also.  Allyne Gee, MD [facility] Pulmonary Critical Care Medicine Sleep Medicine

## 2021-03-01 NOTE — Progress Notes (Signed)
Pulmonary [place]  PROGRESS NOTE     Luis Hunter  FAO:[id number]  DOB: 1975-11-02   DOA: 01/16/2021  Referring Physician: Satira Sark, MD  HPI: Luis Hunter is a 45 y.o. male being followed for ventilator/airway/oxygen weaning Acute on Chronic Respiratory Failure.  Afebrile resting comfortably right now without distress is on T collar  Medications: Reviewed on Rounds  Physical Exam:  Vitals: Temperature is 98.2 pulse 89 respiratory rate 17 blood pressure 117/80 saturations 100%  Ventilator Settings off ventilator currently on T collar  General: Comfortable at this time Neck: supple Cardiovascular: no malignant arrhythmias Respiratory: Scattered rhonchi are noted bilaterally Skin: no rash seen on limited exam Musculoskeletal: No gross abnormality Psychiatric:unable to assess Neurologic:no involuntary movements         Lab Data:   Basic Metabolic Panel: Recent Labs  Lab 02/24/21 0341 02/28/21 0451  NA 138 139  K 3.6 3.9  CL 102 104  CO2 28 28  GLUCOSE 122* 93  BUN 12 12  CREATININE 0.57* 0.54*  CALCIUM 9.1 9.3  MG 1.9  --     ABG: No results for input(s): PHART, PCO2ART, PO2ART, HCO3, O2SAT in the last 168 hours.  Liver Function Tests: Recent Labs  Lab 02/28/21 0451  AST 24  ALT 30  ALKPHOS 77  BILITOT 0.6  PROT 6.5  ALBUMIN 2.9*   No results for input(s): LIPASE, AMYLASE in the last 168 hours. No results for input(s): AMMONIA in the last 168 hours.  CBC: Recent Labs  Lab 02/24/21 0341  WBC 8.6  HGB 11.8*  HCT 37.8*  MCV 90.2  PLT 299    Cardiac Enzymes: No results for input(s): CKTOTAL, CKMB, CKMBINDEX, TROPONINI in the last 168 hours.  BNP (last 3 results) No results for input(s): BNP in the last 8760 hours.  ProBNP (last 3 results) No results for input(s): PROBNP in the last 8760 hours.  Radiological Exams: No results  found.  Assessment/Plan Active Problems:   Acute on chronic respiratory failure with hypoxia (HCC)   Status epilepticus (HCC)   Polysubstance abuse (HCC)   Healthcare-associated pneumonia   Acute metabolic encephalopathy   Acute on chronic respiratory failure hypoxia plan is to continue with the wean on T collar secretions are still limiting factor as far as being able to advance Status epilepticus no sign of seizures right now Healthcare associated pneumonia treated slow improvement Polysubstance abuse at baseline Metabolic encephalopathy grossly unchanged   I have personally seen and evaluated the patient, evaluated laboratory and imaging results, formulated the assessment and plan and placed orders. The Patient requires high complexity decision making with multiple systems involvement.  Rounds were done with the Respiratory Therapy Director and Staff therapists and discussed with nursing staff also.  Allyne Gee, MD [facility] Pulmonary Critical Care Medicine Sleep Medicine

## 2021-03-06 NOTE — Progress Notes (Signed)
Pulmonary [place]  PROGRESS NOTE     LALO TROMP  AYO:[id number]  DOB: March 31, 1976   DOA: 01/16/2021  Referring Physician: Satira Sark, MD  HPI: ELMIN WIEDERHOLT is a 45 y.o. male being followed for ventilator/airway/oxygen weaning Acute on Chronic Respiratory Failure.  On T collar currently has been on 28% FiO2 saturations are good  Medications: Reviewed on Rounds  Physical Exam:  Vitals: Temperature is 97.3 pulse 98 respiratory rate is 18 blood pressure is 106/63 saturations 100%  Ventilator Settings off ventilator on T collar  General: Comfortable at this time Neck: supple Cardiovascular: no malignant arrhythmias Respiratory: Coarse rhonchi expansion is equal Skin: no rash seen on limited exam Musculoskeletal: No gross abnormality Psychiatric:unable to assess Neurologic:no involuntary movements         Lab Data:   Basic Metabolic Panel: Recent Labs  Lab 02/28/21 0451  NA 139  K 3.9  CL 104  CO2 28  GLUCOSE 93  BUN 12  CREATININE 0.54*  CALCIUM 9.3    ABG: No results for input(s): PHART, PCO2ART, PO2ART, HCO3, O2SAT in the last 168 hours.  Liver Function Tests: Recent Labs  Lab 02/28/21 0451  AST 24  ALT 30  ALKPHOS 77  BILITOT 0.6  PROT 6.5  ALBUMIN 2.9*   No results for input(s): LIPASE, AMYLASE in the last 168 hours. No results for input(s): AMMONIA in the last 168 hours.  CBC: No results for input(s): WBC, NEUTROABS, HGB, HCT, MCV, PLT in the last 168 hours.  Cardiac Enzymes: No results for input(s): CKTOTAL, CKMB, CKMBINDEX, TROPONINI in the last 168 hours.  BNP (last 3 results) No results for input(s): BNP in the last 8760 hours.  ProBNP (last 3 results) No results for input(s): PROBNP in the last 8760 hours.  Radiological Exams: No results found.  Assessment/Plan Active Problems:   Acute on chronic respiratory failure with hypoxia (HCC)    Status epilepticus (HCC)   Polysubstance abuse (HCC)   Healthcare-associated pneumonia   Acute metabolic encephalopathy   Acute on chronic respiratory failure with hypoxia patient is right now doing well with capping trials secretions remain limiting factor as far as being able to wean Status epilepticus no sign of active seizures Polysubstance abuse patient is at baseline Healthcare associated pneumonia treated Metabolic encephalopathy no change   I have personally seen and evaluated the patient, evaluated laboratory and imaging results, formulated the assessment and plan and placed orders. The Patient requires high complexity decision making with multiple systems involvement.  Rounds were done with the Respiratory Therapy Director and Staff therapists and discussed with nursing staff also.  Allyne Gee, MD [facility] Pulmonary Critical Care Medicine Sleep Medicine

## 2021-03-17 ENCOUNTER — Other Ambulatory Visit (HOSPITAL_COMMUNITY): Payer: Self-pay

## 2021-03-17 MED ORDER — LEVETIRACETAM 100 MG/ML PO SOLN
ORAL | 0 refills | Status: DC
  Filled 2021-03-17: qty 900, 30d supply, fill #0

## 2021-03-17 MED ORDER — BENZTROPINE MESYLATE 1 MG PO TABS
ORAL_TABLET | ORAL | 0 refills | Status: DC
  Filled 2021-03-17: qty 30, 30d supply, fill #0

## 2021-03-17 MED ORDER — MIRTAZAPINE 15 MG PO TABS
ORAL_TABLET | ORAL | 0 refills | Status: DC
  Filled 2021-03-17: qty 15, 30d supply, fill #0

## 2021-03-17 MED ORDER — HALOPERIDOL 1 MG PO TABS
ORAL_TABLET | ORAL | 0 refills | Status: DC
  Filled 2021-03-17: qty 450, 30d supply, fill #0

## 2021-03-17 MED ORDER — OLANZAPINE 5 MG PO TABS
ORAL_TABLET | ORAL | 0 refills | Status: DC
  Filled 2021-03-17: qty 30, 30d supply, fill #0

## 2021-03-17 MED ORDER — LANSOPRAZOLE 30 MG PO TBDD
DELAYED_RELEASE_TABLET | ORAL | 0 refills | Status: DC
  Filled 2021-03-17: qty 30, 30d supply, fill #0

## 2021-03-17 MED ORDER — SCOPOLAMINE 1 MG/3DAYS TD PT72
MEDICATED_PATCH | TRANSDERMAL | 0 refills | Status: DC
  Filled 2021-03-17: qty 5, 14d supply, fill #0

## 2021-03-17 MED ORDER — CLONAZEPAM 0.5 MG PO TABS
ORAL_TABLET | ORAL | 0 refills | Status: DC
  Filled 2021-03-17 – 2021-04-02 (×2): qty 20, 3d supply, fill #0

## 2021-03-17 MED ORDER — ARIPIPRAZOLE 5 MG PO TABS
ORAL_TABLET | ORAL | 0 refills | Status: DC
  Filled 2021-03-17: qty 60, 30d supply, fill #0

## 2021-03-18 ENCOUNTER — Other Ambulatory Visit (HOSPITAL_COMMUNITY): Payer: Self-pay

## 2021-03-18 MED ORDER — GUAIFENESIN 100 MG/5ML PO LIQD: ORAL | 0 refills | Status: DC

## 2021-03-19 ENCOUNTER — Other Ambulatory Visit (HOSPITAL_COMMUNITY): Payer: Self-pay

## 2021-03-20 ENCOUNTER — Other Ambulatory Visit (HOSPITAL_COMMUNITY): Payer: Self-pay

## 2021-03-22 ENCOUNTER — Other Ambulatory Visit (HOSPITAL_COMMUNITY): Payer: Self-pay

## 2021-03-27 ENCOUNTER — Other Ambulatory Visit (HOSPITAL_COMMUNITY): Payer: Self-pay

## 2021-03-30 ENCOUNTER — Other Ambulatory Visit (HOSPITAL_COMMUNITY): Payer: Self-pay

## 2021-04-02 ENCOUNTER — Other Ambulatory Visit (HOSPITAL_COMMUNITY): Payer: Self-pay

## 2021-04-03 ENCOUNTER — Other Ambulatory Visit (HOSPITAL_COMMUNITY): Payer: Self-pay

## 2021-04-19 ENCOUNTER — Ambulatory Visit: Payer: Medicare HMO | Admitting: Internal Medicine

## 2021-04-26 ENCOUNTER — Emergency Department
Admission: EM | Admit: 2021-04-26 | Discharge: 2021-04-26 | Disposition: A | Discharge status: Other Acute Inpatient Hospital | Payer: Medicare HMO | Attending: Emergency Medicine | Admitting: Emergency Medicine

## 2021-04-26 ENCOUNTER — Emergency Department: Payer: Medicare HMO

## 2021-04-26 ENCOUNTER — Inpatient Hospital Stay (HOSPITAL_COMMUNITY): Payer: Medicare HMO

## 2021-04-26 ENCOUNTER — Inpatient Hospital Stay (HOSPITAL_COMMUNITY)
Admission: AD | Admit: 2021-04-26 | Discharge: 2021-04-28 | DRG: 101 | Disposition: A | Discharge status: Home / Self Care | Payer: Medicare HMO | Source: Other Acute Inpatient Hospital | Attending: Student | Admitting: Student

## 2021-04-26 ENCOUNTER — Other Ambulatory Visit: Payer: Self-pay

## 2021-04-26 DIAGNOSIS — D72829 Elevated white blood cell count, unspecified: Secondary | ICD-10-CM

## 2021-04-26 DIAGNOSIS — Z20822 Contact with and (suspected) exposure to covid-19: Secondary | ICD-10-CM | POA: Insufficient documentation

## 2021-04-26 DIAGNOSIS — E7220 Disorder of urea cycle metabolism, unspecified: Secondary | ICD-10-CM | POA: Diagnosis present

## 2021-04-26 DIAGNOSIS — Z781 Physical restraint status: Secondary | ICD-10-CM

## 2021-04-26 DIAGNOSIS — Z9189 Other specified personal risk factors, not elsewhere classified: Secondary | ICD-10-CM | POA: Diagnosis not present

## 2021-04-26 DIAGNOSIS — Z79899 Other long term (current) drug therapy: Secondary | ICD-10-CM | POA: Diagnosis not present

## 2021-04-26 DIAGNOSIS — R7981 Abnormal blood-gas level: Secondary | ICD-10-CM | POA: Insufficient documentation

## 2021-04-26 DIAGNOSIS — I10 Essential (primary) hypertension: Secondary | ICD-10-CM | POA: Diagnosis present

## 2021-04-26 DIAGNOSIS — F209 Schizophrenia, unspecified: Secondary | ICD-10-CM | POA: Diagnosis present

## 2021-04-26 DIAGNOSIS — J9601 Acute respiratory failure with hypoxia: Secondary | ICD-10-CM | POA: Diagnosis not present

## 2021-04-26 DIAGNOSIS — J45909 Unspecified asthma, uncomplicated: Secondary | ICD-10-CM | POA: Diagnosis present

## 2021-04-26 DIAGNOSIS — E872 Acidosis, unspecified: Secondary | ICD-10-CM | POA: Diagnosis present

## 2021-04-26 DIAGNOSIS — F419 Anxiety disorder, unspecified: Secondary | ICD-10-CM | POA: Diagnosis present

## 2021-04-26 DIAGNOSIS — F17200 Nicotine dependence, unspecified, uncomplicated: Secondary | ICD-10-CM | POA: Insufficient documentation

## 2021-04-26 DIAGNOSIS — R569 Unspecified convulsions: Secondary | ICD-10-CM | POA: Diagnosis present

## 2021-04-26 DIAGNOSIS — F319 Bipolar disorder, unspecified: Secondary | ICD-10-CM | POA: Diagnosis present

## 2021-04-26 DIAGNOSIS — K219 Gastro-esophageal reflux disease without esophagitis: Secondary | ICD-10-CM | POA: Diagnosis present

## 2021-04-26 DIAGNOSIS — G40901 Epilepsy, unspecified, not intractable, with status epilepticus: Principal | ICD-10-CM | POA: Diagnosis present

## 2021-04-26 DIAGNOSIS — E8729 Other acidosis: Secondary | ICD-10-CM | POA: Diagnosis not present

## 2021-04-26 DIAGNOSIS — F1910 Other psychoactive substance abuse, uncomplicated: Secondary | ICD-10-CM

## 2021-04-26 LAB — CBC
HCT: 38.3 % — ABNORMAL LOW (ref 39.0–52.0)
Hemoglobin: 12.3 g/dL — ABNORMAL LOW (ref 13.0–17.0)
MCH: 28.8 pg (ref 26.0–34.0)
MCHC: 32.1 g/dL (ref 30.0–36.0)
MCV: 89.7 fL (ref 80.0–100.0)
Platelets: 189 10*3/uL (ref 150–400)
RBC: 4.27 MIL/uL (ref 4.22–5.81)
RDW: 15.7 % — ABNORMAL HIGH (ref 11.5–15.5)
WBC: 15.3 10*3/uL — ABNORMAL HIGH (ref 4.0–10.5)
nRBC: 0 % (ref 0.0–0.2)

## 2021-04-26 LAB — URINALYSIS, COMPLETE (UACMP) WITH MICROSCOPIC
Bilirubin Urine: NEGATIVE
Glucose, UA: 100 mg/dL — AB
Ketones, ur: NEGATIVE mg/dL
Leukocytes,Ua: NEGATIVE
Nitrite: NEGATIVE
Protein, ur: NEGATIVE mg/dL
Specific Gravity, Urine: 1.025 (ref 1.005–1.030)
pH: 5 (ref 5.0–8.0)

## 2021-04-26 LAB — COMPREHENSIVE METABOLIC PANEL
ALT: 19 U/L (ref 0–44)
AST: 36 U/L (ref 15–41)
Albumin: 4.2 g/dL (ref 3.5–5.0)
Alkaline Phosphatase: 78 U/L (ref 38–126)
Anion gap: 19 — ABNORMAL HIGH (ref 5–15)
BUN: 13 mg/dL (ref 6–20)
CO2: 11 mmol/L — ABNORMAL LOW (ref 22–32)
Calcium: 9.4 mg/dL (ref 8.9–10.3)
Chloride: 108 mmol/L (ref 98–111)
Creatinine, Ser: 0.93 mg/dL (ref 0.61–1.24)
GFR, Estimated: >60 mL/min (ref >60)
Glucose, Bld: 172 mg/dL — ABNORMAL HIGH (ref 70–99)
Potassium: 3.5 mmol/L (ref 3.5–5.1)
Sodium: 138 mmol/L (ref 135–145)
Total Bilirubin: 0.4 mg/dL (ref 0.3–1.2)
Total Protein: 7.9 g/dL (ref 6.5–8.1)

## 2021-04-26 LAB — AMMONIA: Ammonia: 55 umol/L — ABNORMAL HIGH (ref 9–35)

## 2021-04-26 LAB — HIV ANTIBODY (ROUTINE TESTING W REFLEX): HIV Screen 4th Generation wRfx: NONREACTIVE

## 2021-04-26 LAB — BLOOD GAS, ARTERIAL
Acid-base deficit: 8.1 mmol/L — ABNORMAL HIGH (ref 0.0–2.0)
Bicarbonate: 18.9 mmol/L — ABNORMAL LOW (ref 20.0–28.0)
FIO2: 0.24
MECHVT: 450 mL
Mechanical Rate: 18
O2 Saturation: 94.9 %
PEEP: 5 cmH2O
Patient temperature: 37
RATE: 18 resp/min
pCO2 arterial: 43 mmHg (ref 32.0–48.0)
pH, Arterial: 7.25 — ABNORMAL LOW (ref 7.350–7.450)
pO2, Arterial: 87 mmHg (ref 83.0–108.0)

## 2021-04-26 LAB — ACETAMINOPHEN LEVEL: Acetaminophen (Tylenol), Serum: <10 ug/mL — ABNORMAL LOW (ref 10–30)

## 2021-04-26 LAB — SALICYLATE LEVEL: Salicylate Lvl: <7 mg/dL — ABNORMAL LOW (ref 7.0–30.0)

## 2021-04-26 LAB — POCT I-STAT 7, (LYTES, BLD GAS, ICA,H+H)
Acid-base deficit: 1 mmol/L (ref 0.0–2.0)
Bicarbonate: 25.1 mmol/L (ref 20.0–28.0)
Calcium, Ion: 1.3 mmol/L (ref 1.15–1.40)
HCT: 38 % — ABNORMAL LOW (ref 39.0–52.0)
Hemoglobin: 12.9 g/dL — ABNORMAL LOW (ref 13.0–17.0)
O2 Saturation: 97 %
Patient temperature: 36.6
Potassium: 3.7 mmol/L (ref 3.5–5.1)
Sodium: 138 mmol/L (ref 135–145)
TCO2: 27 mmol/L (ref 22–32)
pCO2 arterial: 45 mmHg (ref 32.0–48.0)
pH, Arterial: 7.354 (ref 7.350–7.450)
pO2, Arterial: 97 mmHg (ref 83.0–108.0)

## 2021-04-26 LAB — CBC WITH DIFFERENTIAL/PLATELET
Abs Immature Granulocytes: 0.12 10*3/uL — ABNORMAL HIGH (ref 0.00–0.07)
Basophils Absolute: 0.1 10*3/uL (ref 0.0–0.1)
Basophils Relative: 1 %
Eosinophils Absolute: 0.4 10*3/uL (ref 0.0–0.5)
Eosinophils Relative: 3 %
HCT: 45.2 % (ref 39.0–52.0)
Hemoglobin: 13.4 g/dL (ref 13.0–17.0)
Immature Granulocytes: 1 %
Lymphocytes Relative: 32 %
Lymphs Abs: 4.2 10*3/uL — ABNORMAL HIGH (ref 0.7–4.0)
MCH: 28.1 pg (ref 26.0–34.0)
MCHC: 29.6 g/dL — ABNORMAL LOW (ref 30.0–36.0)
MCV: 94.8 fL (ref 80.0–100.0)
Monocytes Absolute: 1.1 10*3/uL — ABNORMAL HIGH (ref 0.1–1.0)
Monocytes Relative: 8 %
Neutro Abs: 7.2 10*3/uL (ref 1.7–7.7)
Neutrophils Relative %: 55 %
Platelets: 281 10*3/uL (ref 150–400)
RBC: 4.77 MIL/uL (ref 4.22–5.81)
RDW: 15.9 % — ABNORMAL HIGH (ref 11.5–15.5)
WBC: 13 10*3/uL — ABNORMAL HIGH (ref 4.0–10.5)
nRBC: 0 % (ref 0.0–0.2)

## 2021-04-26 LAB — LACTIC ACID, PLASMA
Lactic Acid, Venous: 6 mmol/L (ref 0.5–1.9)
Lactic Acid, Venous: 3.3 mmol/L (ref 0.5–1.9)
Lactic Acid, Venous: 1.1 mmol/L (ref 0.5–1.9)

## 2021-04-26 LAB — GLUCOSE, CAPILLARY
Glucose-Capillary: 56 mg/dL — ABNORMAL LOW (ref 70–99)
Glucose-Capillary: 162 mg/dL — ABNORMAL HIGH (ref 70–99)
Glucose-Capillary: 118 mg/dL — ABNORMAL HIGH (ref 70–99)
Glucose-Capillary: 109 mg/dL — ABNORMAL HIGH (ref 70–99)

## 2021-04-26 LAB — URINE DRUG SCREEN, QUALITATIVE (ARMC ONLY)
Amphetamines, Ur Screen: NOT DETECTED
Barbiturates, Ur Screen: NOT DETECTED
Benzodiazepine, Ur Scrn: POSITIVE — AB
Cannabinoid 50 Ng, Ur ~~LOC~~: POSITIVE — AB
Cocaine Metabolite,Ur ~~LOC~~: NOT DETECTED
MDMA (Ecstasy)Ur Screen: NOT DETECTED
Methadone Scn, Ur: NOT DETECTED
Opiate, Ur Screen: NOT DETECTED
Phencyclidine (PCP) Ur S: NOT DETECTED
Tricyclic, Ur Screen: NOT DETECTED

## 2021-04-26 LAB — CREATININE, SERUM
Creatinine, Ser: 0.73 mg/dL (ref 0.61–1.24)
GFR, Estimated: >60 mL/min (ref >60)

## 2021-04-26 LAB — RESP PANEL BY RT-PCR (FLU A&B, COVID) ARPGX2
Influenza A by PCR: NEGATIVE
Influenza B by PCR: NEGATIVE
SARS Coronavirus 2 by RT PCR: NEGATIVE

## 2021-04-26 LAB — HEMOGLOBIN A1C
Hgb A1c MFr Bld: 4.8 % (ref 4.8–5.6)
Mean Plasma Glucose: 91.06 mg/dL

## 2021-04-26 LAB — MAGNESIUM: Magnesium: 2 mg/dL (ref 1.7–2.4)

## 2021-04-26 LAB — ETHANOL: Alcohol, Ethyl (B): <10 mg/dL (ref <10)

## 2021-04-26 LAB — MRSA NEXT GEN BY PCR, NASAL: MRSA by PCR Next Gen: NOT DETECTED

## 2021-04-26 IMAGING — CT CT HEAD W/O CM
4 series · 17 of 47 positions shown, 19 images · non-contrast
Comparison: [DATE]

CLINICAL DATA: status epilepticus

EXAM:
CT HEAD WITHOUT CONTRAST
TECHNIQUE: Contiguous axial images were obtained from the base of the skull
through the vertex without intravenous contrast.

[Series 2: head wo · axial · 0.50mm/px · z∈[+404,+529]mm · 7 of 35 slices shown, 9 images]
[im 5/35  brain]
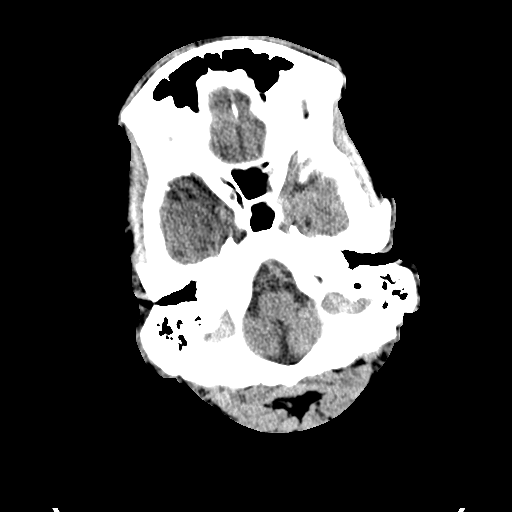
[im 5/35  bone]
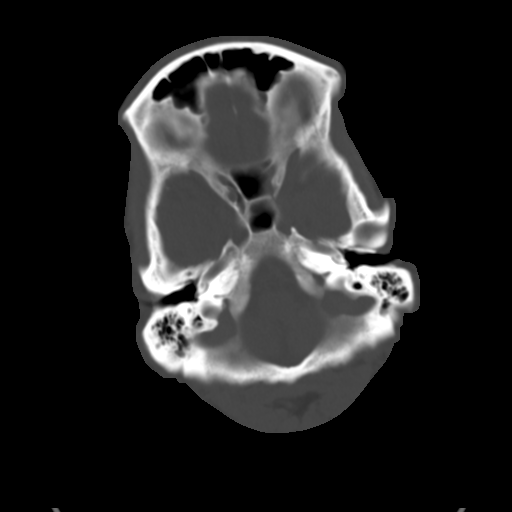
[im 9/35  brain]
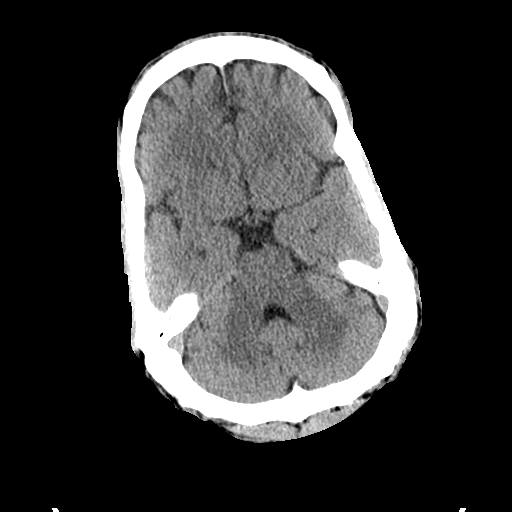
[im 13/35  brain]
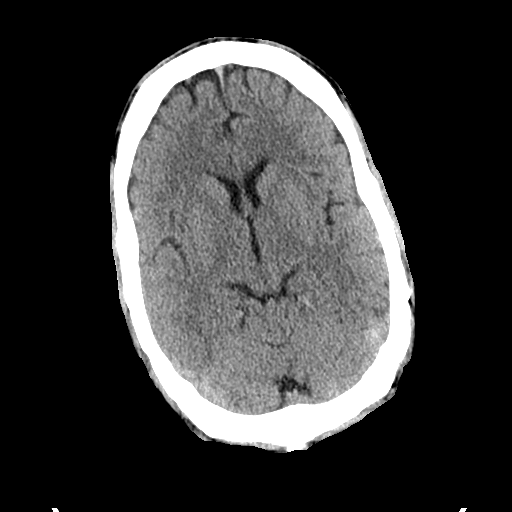
[im 18/35  brain]
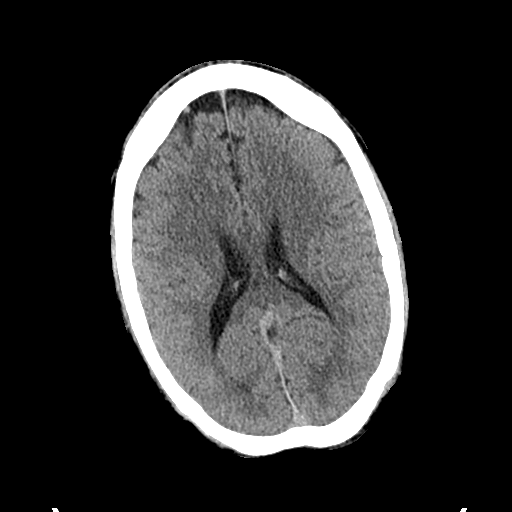
[im 22/35  brain]
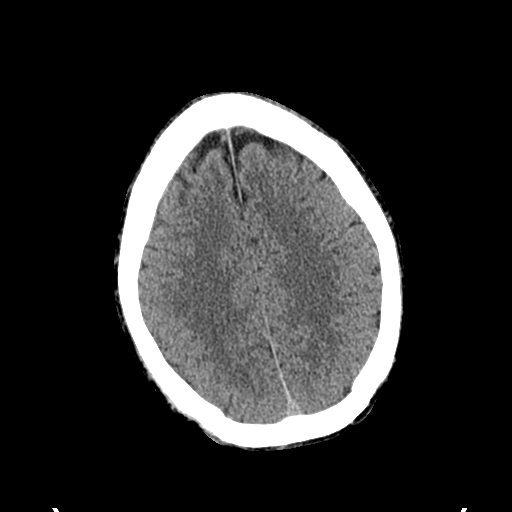
[im 22/35  bone]
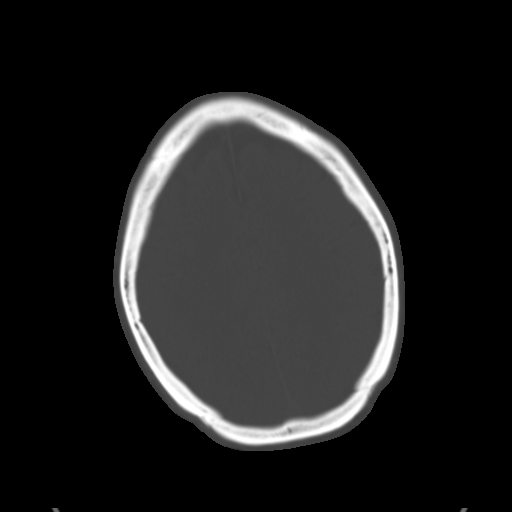
[im 26/35  brain]
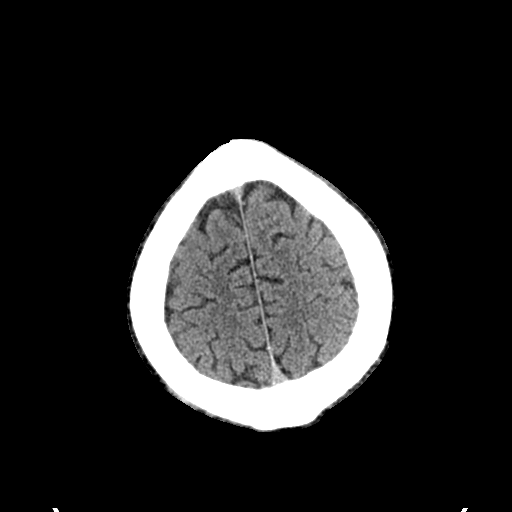
[im 30/35  brain]
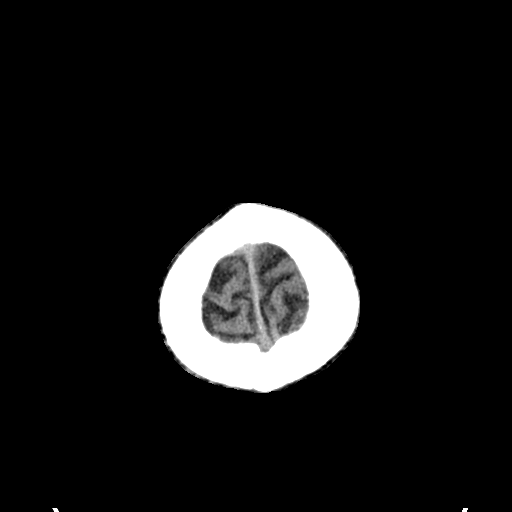

[Series 3: head bone · axial · 0.50mm/px · z∈[+400,+460]mm · 4 of 87 slices shown]
[im 9/87  bone]
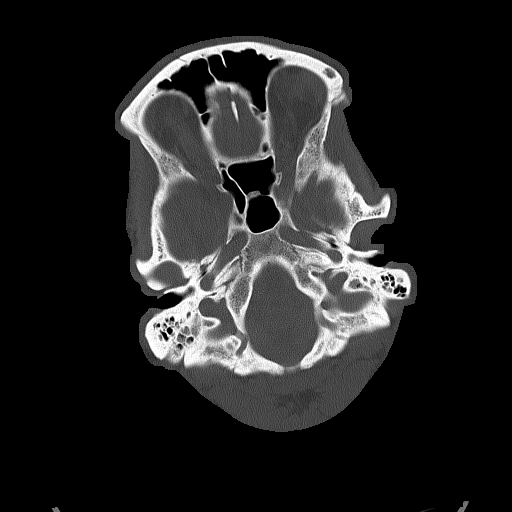
[im 18/87  bone]
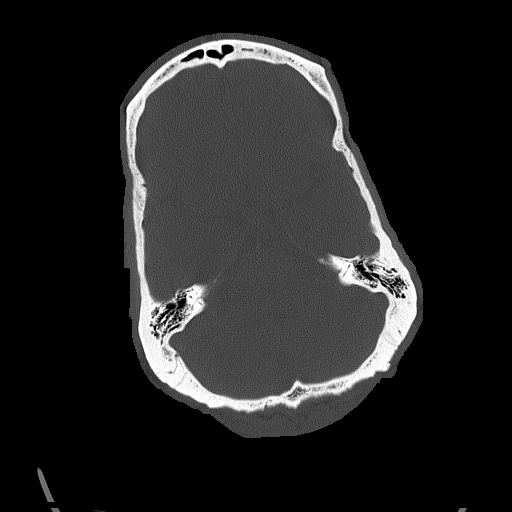
[im 26/87  bone]
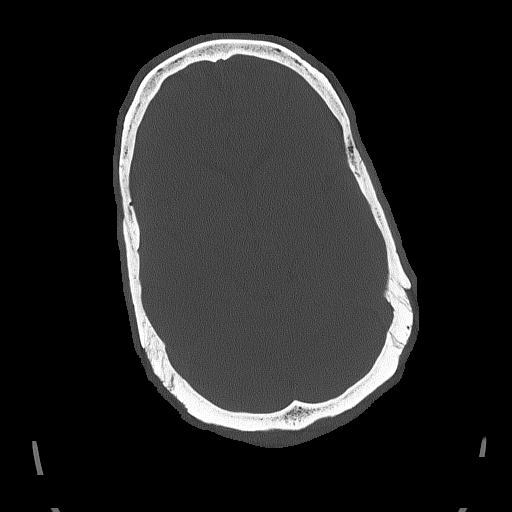
[im 39/87  bone]
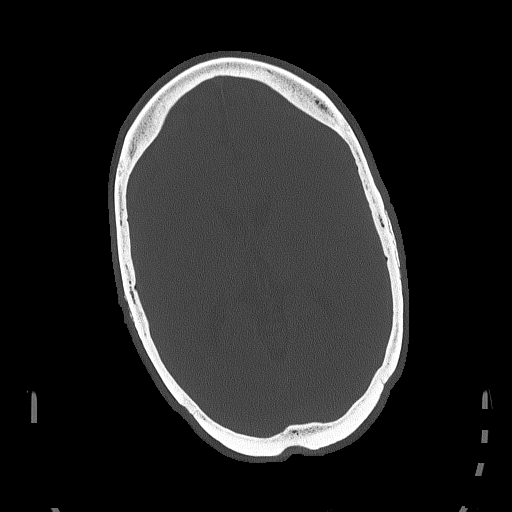

[Series 4: coronal soft tissue · coronal · 0.34mm/px · 3 of 75 slices shown]
[im 25/75  brain]
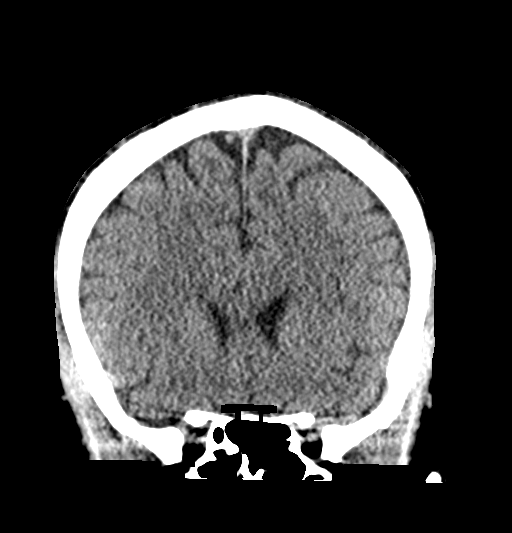
[im 33/75  brain]
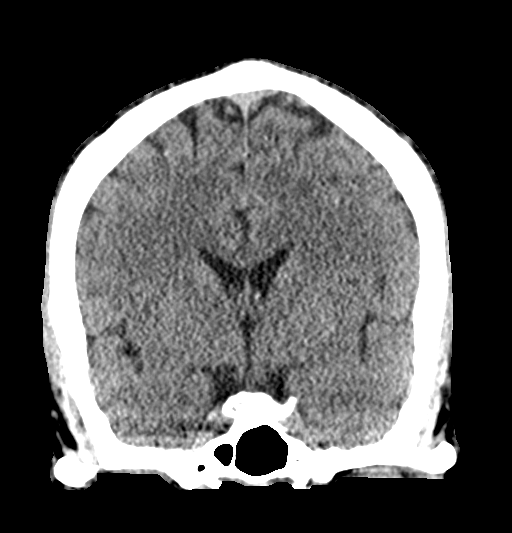
[im 42/75  brain]
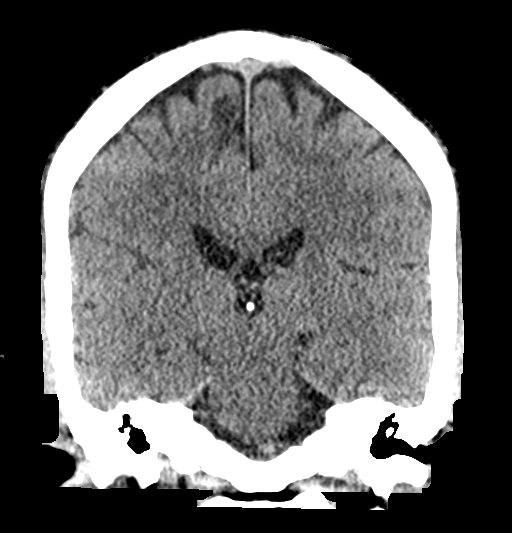

[Series 5: sagittal soft tissue · sagittal · 0.35mm/px · 3 of 58 slices shown]
[im 20/58  brain]
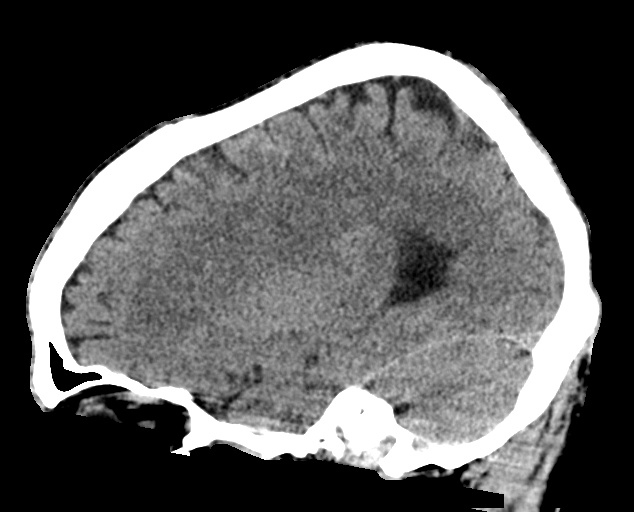
[im 29/58  brain]
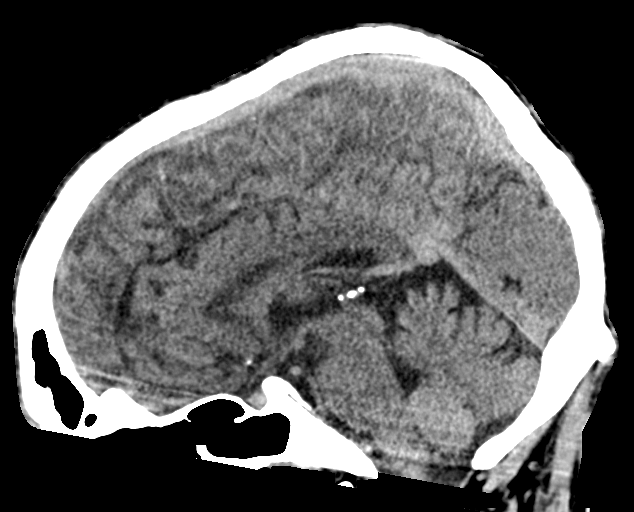
[im 39/58  brain]
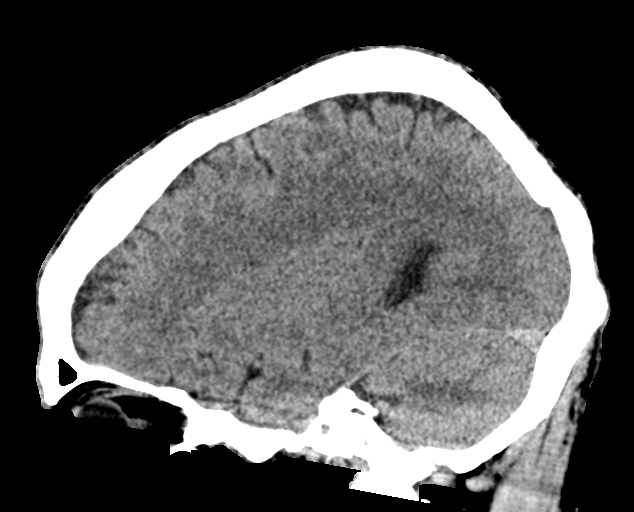

[17 of 47 positions shown; findings below may reference images not displayed]

FINDINGS: Brain: No acute intracranial abnormality. Specifically, no
hemorrhage, hydrocephalus, mass lesion, acute infarction, or
significant intracranial injury.

Vascular: No hyperdense vessel or unexpected calcification.

Skull: No acute calvarial abnormality.

Sinuses/Orbits: No acute findings

Other: None
IMPRESSION: No acute intracranial abnormality.

## 2021-04-26 IMAGING — DX DG CHEST 1V PORT
1 series · 1 of 1 positions shown · non-contrast
Comparison: Comparison is made with exam from [DATE].

CLINICAL DATA: Post intubation in a 45-year-old male.

EXAM:
PORTABLE CHEST 1 VIEW

[chest ap]
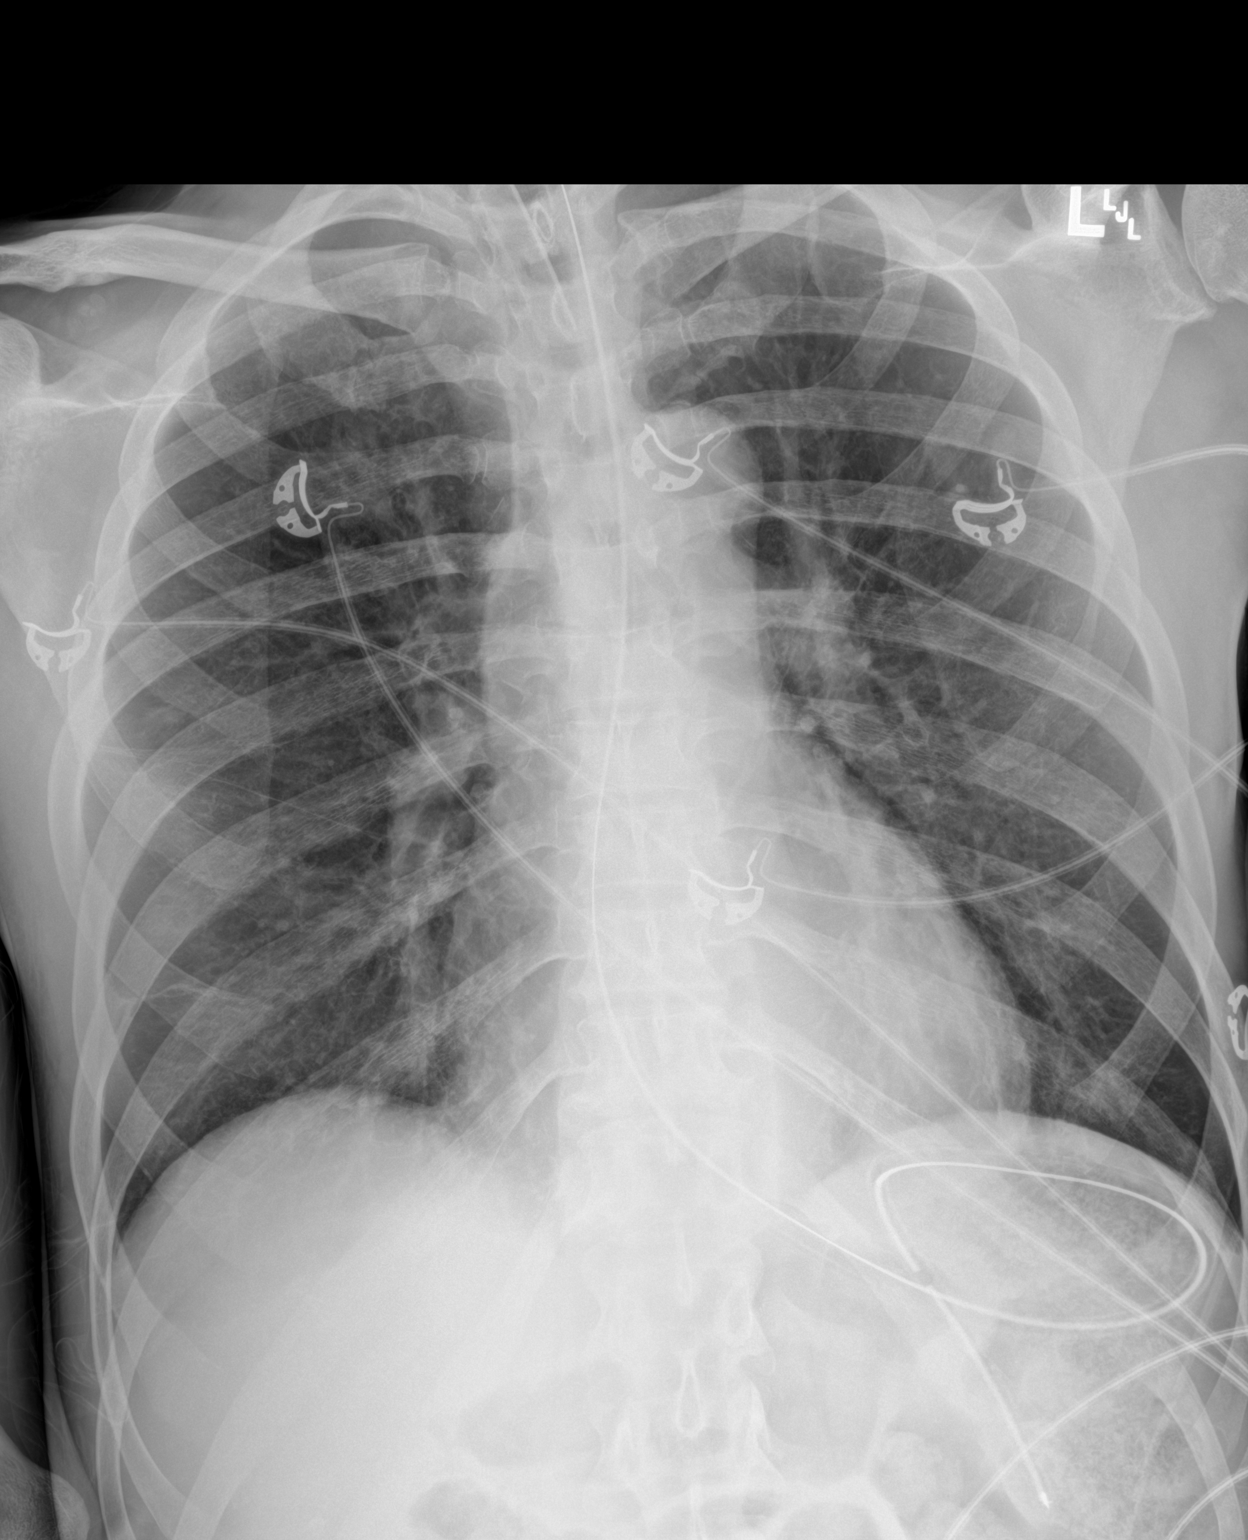

[1 of 1 positions shown; findings below may reference images not displayed]

FINDINGS: Endotracheal tube approximately 6 cm from the carina, tip just below
the clavicular heads. Gastric tube coiled in the stomach.

EKG leads project over the chest.

Cardiomediastinal contours and hilar structures are normal.

Minimal basilar opacities. No lobar consolidation. No sign of
effusion on frontal radiograph or visible pneumothorax.

On limited assessment there is no acute skeletal process.
IMPRESSION: Endotracheal tube approximately 6 cm from the carina, tip just below
the clavicular heads in the proximal to mid trachea.

Minimal basilar opacities, may reflect atelectasis. No lobar
consolidation.

## 2021-04-26 MED ORDER — DOCUSATE SODIUM 100 MG PO CAPS: 100.0000 mg | ORAL_CAPSULE | Freq: Two times a day (BID) | ORAL | Status: DC | PRN

## 2021-04-26 MED ORDER — POLYETHYLENE GLYCOL 3350 17 G PO PACK: 17.0000 g | PACK | Freq: Every day | ORAL | Status: DC | PRN

## 2021-04-26 MED ORDER — ROCURONIUM BROMIDE 50 MG/5ML IV SOLN
INTRAVENOUS | Status: DC | PRN
  Administered 2021-04-26: 100 mg via INTRAVENOUS

## 2021-04-26 MED ORDER — CHLORHEXIDINE GLUCONATE CLOTH 2 % EX PADS
6.0000 | MEDICATED_PAD | Freq: Every day | CUTANEOUS | Status: DC
  Administered 2021-04-26 – 2021-04-27 (×2): 6 via TOPICAL

## 2021-04-26 MED ORDER — FENTANYL CITRATE PF 50 MCG/ML IJ SOSY: 50.0000 ug | PREFILLED_SYRINGE | Freq: Once | INTRAMUSCULAR | Status: DC

## 2021-04-26 MED ORDER — DOCUSATE SODIUM 50 MG/5ML PO LIQD
100.0000 mg | Freq: Two times a day (BID) | ORAL | Status: DC
  Administered 2021-04-26 – 2021-04-27 (×3): 100 mg
  Filled 2021-04-26 (×3): qty 10

## 2021-04-26 MED ORDER — DEXTROSE 50 % IV SOLN
INTRAVENOUS | Status: AC
  Administered 2021-04-26: 17:00:00 50 mL via INTRAVENOUS
  Filled 2021-04-26: qty 50

## 2021-04-26 MED ORDER — PROPOFOL 1000 MG/100ML IV EMUL: 0.0000 ug/kg/min | INTRAVENOUS | Status: DC

## 2021-04-26 MED ORDER — ENOXAPARIN SODIUM 40 MG/0.4ML IJ SOSY
40.0000 mg | PREFILLED_SYRINGE | INTRAMUSCULAR | Status: DC
  Administered 2021-04-26 – 2021-04-27 (×2): 40 mg via SUBCUTANEOUS
  Filled 2021-04-26 (×2): qty 0.4

## 2021-04-26 MED ORDER — FENTANYL BOLUS VIA INFUSION
50.0000 ug | INTRAVENOUS | Status: DC | PRN
  Administered 2021-04-26: 17:00:00 100 ug via INTRAVENOUS
  Filled 2021-04-26: qty 100

## 2021-04-26 MED ORDER — PANTOPRAZOLE 2 MG/ML SUSPENSION
40.0000 mg | Freq: Every day | ORAL | Status: DC
  Administered 2021-04-26 – 2021-04-27 (×2): 40 mg
  Filled 2021-04-26 (×2): qty 20

## 2021-04-26 MED ORDER — POLYETHYLENE GLYCOL 3350 17 G PO PACK
17.0000 g | PACK | Freq: Every day | ORAL | Status: DC
  Administered 2021-04-26 – 2021-04-27 (×2): 17 g
  Filled 2021-04-26 (×2): qty 1

## 2021-04-26 MED ORDER — MIDAZOLAM HCL 5 MG/5ML IJ SOLN
5.0000 mg | Freq: Once | INTRAMUSCULAR | Status: AC
  Administered 2021-04-26: 11:00:00 5 mg via INTRAVENOUS
  Filled 2021-04-26: qty 5

## 2021-04-26 MED ORDER — REVEFENACIN 175 MCG/3ML IN SOLN
175.0000 ug | Freq: Every day | RESPIRATORY_TRACT | Status: DC
  Administered 2021-04-26 – 2021-04-28 (×3): 175 ug via RESPIRATORY_TRACT
  Filled 2021-04-26 (×3): qty 3

## 2021-04-26 MED ORDER — ARFORMOTEROL TARTRATE 15 MCG/2ML IN NEBU
15.0000 ug | INHALATION_SOLUTION | Freq: Two times a day (BID) | RESPIRATORY_TRACT | Status: DC
  Administered 2021-04-26 – 2021-04-28 (×4): 15 ug via RESPIRATORY_TRACT
  Filled 2021-04-26 (×5): qty 2

## 2021-04-26 MED ORDER — DEXTROSE 50 % IV SOLN
1.0000 | Freq: Once | INTRAVENOUS | Status: AC
Start: 2021-04-26 — End: 2021-04-26

## 2021-04-26 MED ORDER — LEVETIRACETAM IN NACL 1500 MG/100ML IV SOLN
1500.0000 mg | Freq: Once | INTRAVENOUS | Status: AC
  Administered 2021-04-26: 10:00:00 1500 mg via INTRAVENOUS
  Filled 2021-04-26: qty 100

## 2021-04-26 MED ORDER — ORAL CARE MOUTH RINSE
15.0000 mL | OROMUCOSAL | Status: DC
  Administered 2021-04-26 – 2021-04-27 (×7): 15 mL via OROMUCOSAL

## 2021-04-26 MED ORDER — FENTANYL 2500MCG IN NS 250ML (10MCG/ML) PREMIX INFUSION
100.0000 ug/h | INTRAVENOUS | Status: DC
  Administered 2021-04-26: 11:00:00 100 ug/h via INTRAVENOUS
  Filled 2021-04-26: qty 250

## 2021-04-26 MED ORDER — PROPOFOL 1000 MG/100ML IV EMUL
20.0000 ug/kg/min | INTRAVENOUS | Status: DC
  Administered 2021-04-26: 09:00:00 30 ug/kg/min via INTRAVENOUS
  Filled 2021-04-26: qty 100

## 2021-04-26 MED ORDER — MIDAZOLAM HCL 2 MG/2ML IJ SOLN: 2.0000 mg | INTRAMUSCULAR | Status: DC | PRN

## 2021-04-26 MED ORDER — FENTANYL CITRATE PF 50 MCG/ML IJ SOSY
100.0000 ug | PREFILLED_SYRINGE | Freq: Once | INTRAMUSCULAR | Status: AC
  Administered 2021-04-26: 11:00:00 100 ug via INTRAVENOUS
  Filled 2021-04-26: qty 2

## 2021-04-26 MED ORDER — AMLODIPINE BESYLATE 10 MG PO TABS
10.0000 mg | ORAL_TABLET | Freq: Every day | ORAL | Status: DC
  Administered 2021-04-27: 09:00:00 10 mg
  Filled 2021-04-26: qty 1

## 2021-04-26 MED ORDER — CHLORHEXIDINE GLUCONATE 0.12% ORAL RINSE (MEDLINE KIT)
15.0000 mL | Freq: Two times a day (BID) | OROMUCOSAL | Status: DC
  Administered 2021-04-26 – 2021-04-27 (×3): 15 mL via OROMUCOSAL

## 2021-04-26 MED ORDER — LABETALOL HCL 5 MG/ML IV SOLN: 5.0000 mg | INTRAVENOUS | Status: DC | PRN

## 2021-04-26 MED ORDER — BUDESONIDE 0.25 MG/2ML IN SUSP
0.2500 mg | Freq: Two times a day (BID) | RESPIRATORY_TRACT | Status: DC
  Administered 2021-04-26 – 2021-04-28 (×4): 0.25 mg via RESPIRATORY_TRACT
  Filled 2021-04-26 (×4): qty 2

## 2021-04-26 MED ORDER — FENTANYL CITRATE PF 50 MCG/ML IJ SOSY: 50.0000 ug | PREFILLED_SYRINGE | INTRAMUSCULAR | Status: DC | PRN

## 2021-04-26 MED ORDER — LEVETIRACETAM IN NACL 1000 MG/100ML IV SOLN
1000.0000 mg | Freq: Two times a day (BID) | INTRAVENOUS | Status: DC
  Administered 2021-04-26 – 2021-04-28 (×4): 1000 mg via INTRAVENOUS
  Filled 2021-04-26 (×4): qty 100

## 2021-04-26 MED ORDER — INSULIN ASPART 100 UNIT/ML IJ SOLN: 0.0000 [IU] | INTRAMUSCULAR | Status: DC

## 2021-04-26 MED ORDER — ALBUTEROL SULFATE (2.5 MG/3ML) 0.083% IN NEBU: 2.5000 mg | INHALATION_SOLUTION | RESPIRATORY_TRACT | Status: DC | PRN

## 2021-04-26 MED ORDER — LACTATED RINGERS IV BOLUS
1000.0000 mL | Freq: Once | INTRAVENOUS | Status: AC
  Administered 2021-04-26: 10:00:00 1000 mL via INTRAVENOUS

## 2021-04-26 MED ORDER — FENTANYL 2500MCG IN NS 250ML (10MCG/ML) PREMIX INFUSION: 50.0000 ug/h | INTRAVENOUS | Status: DC

## 2021-04-26 MED ORDER — ETOMIDATE 2 MG/ML IV SOLN
INTRAVENOUS | Status: DC | PRN
  Administered 2021-04-26: 20 mg via INTRAVENOUS

## 2021-04-26 MED ORDER — ACETAMINOPHEN 80 MG PO CHEW
80.0000 mg | CHEWABLE_TABLET | Freq: Four times a day (QID) | ORAL | Status: DC | PRN
  Filled 2021-04-26: qty 1

## 2021-04-26 MED ORDER — DEXMEDETOMIDINE HCL IN NACL 400 MCG/100ML IV SOLN
0.0000 ug/kg/h | INTRAVENOUS | Status: DC
  Administered 2021-04-26: 16:00:00 0.4 ug/kg/h via INTRAVENOUS
  Administered 2021-04-26: 22:00:00 0.8 ug/kg/h via INTRAVENOUS
  Administered 2021-04-27: 04:00:00 1 ug/kg/h via INTRAVENOUS
  Filled 2021-04-26 (×3): qty 100

## 2021-04-26 MED ORDER — FENTANYL CITRATE PF 50 MCG/ML IJ SOSY
50.0000 ug | PREFILLED_SYRINGE | INTRAMUSCULAR | Status: DC | PRN
  Administered 2021-04-27 (×2): 50 ug via INTRAVENOUS
  Filled 2021-04-26 (×2): qty 1

## 2021-04-26 NOTE — Progress Notes (Signed)
vLTM started   All impednaces below 10kohms  [facility] to monitor   Patient event  button tested

## 2021-04-26 NOTE — ED Triage Notes (Signed)
Pt to ED via [facility] with c/o seizures, EMS arrived on scene pt was postictal, he had 2 seizures in the ambulance 2 mg of Versed given for both for a total of 4mg  of Versed. Pt eyes have been deviated to the left the entire time including on scene. Girlfriend did witness initial seizure. Has not taken seizure medication today, unsure if he has been complaint on medications.

## 2021-04-26 NOTE — Progress Notes (Signed)
Updated patient's sister, Pearline Cables ([phone]) about patient current status. Sister states she lives with the patient and that he had a seizure 2 weeks after discharged from the hospital and has had multiple adjustments in the dosages of his anti-seizure medications (she is unable to recall name or dosage of medications) - she voiced concern about the changes in dosages possibly causing him to have another seizure.

## 2021-04-26 NOTE — Consult Note (Signed)
Neurology Consultation Reason for Consult: SEIZURE Referring Physician: Dr Ulice Brilliant  CC: seizure  History is obtained from: Chart review as patient is encephalopathic  HPI: Luis Hunter is a 45 y.o. male with past medical history of epilepsy, bipolar disorder, schizophrenia who presented after breakthrough seizures.  Per review of critical care note, patient's girlfriend called EMS because of witnessed seizure-like activity.  Patient had further seizure-like episodes en route to the hospital described as generalized tonic-clonic seizures with gaze deviation to the left.  In the emergency room, he was intubated for airway protection.  He was started on propofol and ICU team was consulted at [facility].  ICU team recommended transfer to [facility] for EEG monitoring.  Neurology was on EEG, neurology was consulted for further recommendations regarding seizures.  Alcohol, patient was admitted in August 2022 for status epilepticus, who was discharged on Keppra 1000 mg twice daily and Klonopin 1 mg every 8 hours.  He was most recently seen at [facility] clinic with plan to start lamotrigine and then wean off of Keppra.   ROS:  Unable to obtain due to altered mental status.   Past Medical History:  Diagnosis Date   Bipolar disorder ([place])    Depression    Schizophrenia ([place])    Seizures ([place])    Per chart review, no family history of epilepsy   Social History:  reports that he has been smoking. He has never used smokeless tobacco. He reports current drug use. Drugs: Marijuana and Cocaine. He reports that he does not drink alcohol.   Medications Prior to Admission  Medication Sig Dispense Refill Last Dose   acetaminophen (TYLENOL) 160 MG/5ML solution Place 20.3 mLs (650 mg total) into feeding tube every 4 (four) hours as needed for mild pain (temp > 101.5). 120 mL 0    albuterol (PROVENTIL) (2.5 MG/3ML) 0.083% nebulizer solution Take 3 mLs (2.5 mg total) by nebulization every 4 (four)  hours as needed for wheezing or shortness of breath. 75 mL 12    amLODipine (NORVASC) 10 MG tablet Place 1 tablet (10 mg total) into feeding tube daily. (Patient not taking: Reported on 04/26/2021)      arformoterol (BROVANA) 15 MCG/2ML NEBU Take 2 mLs (15 mcg total) by nebulization 2 (two) times daily. (Patient not taking: Reported on 04/26/2021) 120 mL     ARIPiprazole (ABILIFY) 10 MG tablet Place 1 tablet (10 mg total) into feeding tube daily. (Patient not taking: Reported on 04/26/2021)      ARIPiprazole (ABILIFY) 5 MG tablet Take 2 tablets by G-TUBE daily (Patient not taking: Reported on 04/26/2021) 60 tablet 0    ARIPiprazole ER (ABILIFY MAINTENA) 400 MG PRSY prefilled syringe Inject 400 mg into the muscle every 30 (thirty) days. Resume after discharge from hospital. Do not take concurrently with PO/VT Abilify. 1 each     benztropine (COGENTIN) 0.5 MG tablet Take 1 tablet (0.5 mg total) by mouth 2 (two) times daily. Resume on discharge from hospital.      benztropine (COGENTIN) 1 MG tablet Take 1/2 tablet by G-TUBE twice daily. (Patient not taking: Reported on 04/26/2021) 30 tablet 0    budesonide (PULMICORT) 0.25 MG/2ML nebulizer solution Take 2 mLs (0.25 mg total) by nebulization 2 (two) times daily. (Patient not taking: Reported on 04/26/2021) 60 mL 12    clonazePAM (KLONOPIN) 0.5 MG tablet Take 2 tablets by G-TUBE every 8 hours as needed for anxiety. 20 tablet 0    clonazePAM (KLONOPIN) 1 MG disintegrating tablet Place  1 tablet (1 mg total) into feeding tube every 8 (eight) hours. (Patient not taking: Reported on 04/26/2021) 30 tablet 0    enoxaparin (LOVENOX) 40 MG/0.4ML injection Inject 0.4 mLs (40 mg total) into the skin daily. (Patient not taking: Reported on 04/26/2021) 0 mL     guaiFENesin (CHEST CONGESTION RELIEF) 100 MG/5ML liquid Give 10 mls via g-tube every 4 hours as directed 120 mL 0    guaiFENesin-dextromethorphan (ROBITUSSIN DM) 100-10 MG/5ML syrup Place 5 mLs into feeding tube  every 4 (four) hours as needed for cough. 118 mL 0    haloperidol (HALDOL) 1 MG tablet Take 5 tablets by G-TUBE 3 times daily. 450 tablet 0    lamoTRIgine (LAMICTAL) 25 MG tablet Wk1:1tab night wk2:1tab 2xday wk3:1tab AM 2tabsPM wk4:2tabs 2xday wk5:2tabs AM 3tabs PM wk6:3tabs 2xday wk7:3tabs AM 4tabs PM, wk8:4tab2xday      lansoprazole (PREVACID SOLUTAB) 30 MG disintegrating tablet Take 1 tablet by G-TUBE daily 30 tablet 0    levETIRAcetam (KEPPRA) 100 MG/ML solution Place 15 mLs (1,500 mg total) into feeding tube every 12 (twelve) hours. 473 mL 12    levETIRAcetam (KEPPRA) 1000 MG tablet Take 1,500 mg by mouth daily.      LORazepam (ATIVAN) 2 MG/ML injection Inject 0.5 mLs (1 mg total) into the vein every 6 (six) hours as needed for anxiety. (Patient not taking: Reported on 04/26/2021) 1 mL 0    metoprolol tartrate (LOPRESSOR) 100 MG tablet Place 1 tablet (100 mg total) into feeding tube 2 (two) times daily. (Patient not taking: Reported on 04/26/2021)      mirtazapine (REMERON) 15 MG tablet Take 1/2 tablet via G-TUBE nightly at bedtime 15 tablet 0    Nutritional Supplements (FEEDING SUPPLEMENT, PROSOURCE TF,) liquid Place 45 mLs into feeding tube 3 (three) times daily.      Nutritional Supplements (FEEDING SUPPLEMENT, VITAL 1.5 CAL,) LIQD Place 1,000 mLs into feeding tube continuous.      OLANZapine (ZYPREXA) 5 MG tablet Take 1 tablet by G-TUBE daily 30 tablet 0    oxyCODONE (OXY IR/ROXICODONE) 5 MG immediate release tablet Place 1 tablet (5 mg total) into feeding tube every 4 (four) hours as needed for moderate pain or severe pain. (Patient not taking: Reported on 04/26/2021) 30 tablet 0    scopolamine (TRANSDERM-SCOP) 1 MG/3DAYS Place 1 film to hairless areas behind one ear every 3 days for 14 days.  Remove old patch, fold & discard away from children & pets. 5 patch 0       Exam: Current vital signs: BP (!) 120/103    Pulse 89    Temp 97.7 F (36.5 C) (Axillary)    Resp 20    Ht 5\' 11"   (1.803 m)    SpO2 100%    BMI 23.49 kg/m  Vital signs in last 24 hours: Temp:  [97.7 F (36.5 C)-98.7 F (37.1 C)] 97.7 F (36.5 C) (12/22 1328) Pulse Rate:  [69-130] 89 (12/22 1328) Resp:  [16-31] 20 (12/22 1328) BP: (86-197)/(65-136) 120/103 (12/22 1328) SpO2:  [86 %-100 %] 100 % (12/22 1328) FiO2 (%):  [24 %-100 %] 100 % (12/22 1328) Weight:  [76.4 kg] 76.4 kg (12/22 0850)   Physical Exam  Constitutional: Appears well-developed and well-nourished.  Psych: Unable to assess due to intubation Eyes: No scleral injection HENT: No OP obstrucion Head: Normocephalic.  Cardiovascular: Normal rate and regular rhythm.  Respiratory: Intubated, coarse breath sounds bilaterally GI: Soft.  No distension Skin: Warm Neuro:   I have reviewed  labs in epic and the results pertinent to this consultation are: CBC:  Recent Labs  Lab 04/26/21 0827  WBC 13.0*  NEUTROABS 7.2  HGB 13.4  HCT 45.2  MCV 94.8  PLT 591    Basic Metabolic Panel:  Lab Results  Component Value Date   NA 138 04/26/2021   K 3.5 04/26/2021   CO2 11 (L) 04/26/2021   GLUCOSE 172 (H) 04/26/2021   BUN 13 04/26/2021   CREATININE 0.93 04/26/2021   CALCIUM 9.4 04/26/2021   GFRNONAA >60 04/26/2021   GFRAA >60 12/07/2019   Lipid Panel:  Lab Results  Component Value Date   LDLCALC 87 11/19/2019   HgbA1c:  Lab Results  Component Value Date   HGBA1C 5.8 (H) 11/20/2019   Urine Drug Screen:     Component Value Date/Time   LABOPIA NONE DETECTED 04/26/2021 1002   LABOPIA NONE DETECTED 12/28/2020 1155   COCAINSCRNUR NONE DETECTED 04/26/2021 1002   LABBENZ POSITIVE (A) 04/26/2021 1002   LABBENZ POSITIVE (A) 12/28/2020 1155   AMPHETMU NONE DETECTED 04/26/2021 1002   AMPHETMU NONE DETECTED 12/28/2020 1155   THCU POSITIVE (A) 04/26/2021 1002   THCU POSITIVE (A) 12/28/2020 1155   LABBARB NONE DETECTED 04/26/2021 1002   LABBARB POSITIVE (A) 12/28/2020 1155    Alcohol Level     Component Value Date/Time   ETH  <10 12/16/2020 1118     I have reviewed the images obtained:  CT head without contrast 04/26/2021: No acute abnormality.  ASSESSMENT/PLAN: 45 year old male with history of epilepsy who presented with breakthrough seizures and status epilepticus.  Status epilepticus, convulsive Epilepsy with breakthrough seizures Cannabis use disorder -Etiology of breakthrough seizure: Suspected medication noncompliance.  Keppra and lamotrigine levels pending.  No evidence of infection  Recommendations: -Routine EEG followed by continuous EEG ordered and pending -Continue Keppra 1000 mg twice daily -Continue propofol at 10 MCG per hour for now.  Will adjust dose based on EEG findings -If any further seizures on EEG, can increase Keppra. Next agent will be Depakote.  Avoid vimpat as patient had eosinophilia with Vimpat during previous admission. -Continue seizure precautions -As needed IV Ativan 2 mg for clinical seizure-like activity -Management of rest of comorbidities per primary team - Discussed plan via secure chat with critical care team.  Thank you for allowing [place] to participate in the care of this patient. If you have any further questions, please contact  me or neurohospitalist.   CRITICAL CARE Performed by: Lora Havens   Total critical care time: 36 minutes  Critical care time was exclusive of separately billable procedures and treating other patients.  Critical care was necessary to treat or prevent imminent or life-threatening deterioration.  Critical care was time spent personally by me on the following activities: development of treatment plan with patient and/or surrogate as well as nursing, discussions with consultants, evaluation of patient's response to treatment, examination of patient, obtaining history from patient or surrogate, ordering and performing treatments and interventions, ordering and review of laboratory studies, ordering and review of radiographic studies,  pulse oximetry and re-evaluation of patient's condition.    Zeb Comfort Epilepsy [facility] neurohospitalist

## 2021-04-26 NOTE — Progress Notes (Signed)
Et Tube advanced in to 25Cm at the lip after MD reviewed Post intubation CXR.

## 2021-04-26 NOTE — Progress Notes (Signed)
Called sister Rene Kocher over phone and updated  her on current medical status of her brother and that he has arrived to [facility] from [facility]. She plans on coming in person early tomorrow morning.

## 2021-04-26 NOTE — H&P (Addendum)
NAME:  Luis Hunter, MRN:  [id number], DOB:  May 18, 1975, LOS: 0 ADMISSION DATE:  04/26/2021, CONSULTATION DATE:  12/22 REFERRING MD:  Dr. Tamala Julian, CHIEF COMPLAINT:  status epilepticus   History of Present Illness:  Patient is a 45 year old male with pertinent PMH of status epilepticus, polysubstance abuse, schizophrenia, depression, bipolar presents to [facility] ED on 12/22 with status epilepticus.  Patient has had a recent admission to [facility] with status epilepticus and was intubated back in 12/2020.  Patient was found to be positive for cocaine and THC.  Patient was unable to be weaned off ventilator and was trached and was discharged on 01/2021.  On 12/22, patient's girlfriend called EMS due to seizure and AMS.  Upon EMS arrival, patient was postictal and was noted to have multiple episodes of seizure-like activity.  Patient required 4 mg of IV Versed.  Seizures appeared generalized tonic clonic activity with eye deviation to the left.  Patient arrived to [facility] ED.  Patient had agonal respirations and required intubation for airway protection.  CT head negative.  Started on propofol for sedation.  Given Keppra.  UDS positive for THC. Ethanol pending.  Acetaminophen and salicylate level negative. Keppra and lamotrigine level pending glucose 171.  Mild leukocytosis but afebrile.  CXR with bilateral small atelectasis.  Lactic acid pending.  does have anion gap metabolic acidosis.  PCCM consulted for transfer to [facility] for continuous EEG monitoring and admission to neuro ICU.  Pertinent  Medical History   Past Medical History:  Diagnosis Date   Bipolar disorder ([place])    Depression    Schizophrenia ([place])    Seizures ([place])      Significant Hospital Events: Including procedures, antibiotic start and stop dates in addition to other pertinent events   12/22: admitted to [facility] w/ status epilepticus and intubated; transferring to [facility] neuro icu for EEG monitoring  Interim History / Subjective:  Patient intubated  on prvc Sedated with propofol/fentanyl PERRL No response to pain; minimal cough/gag  Objective   Blood pressure (!) 120/103, pulse 89, temperature 97.7 F (36.5 C), temperature source Axillary, resp. rate 20, height 5\' 11"  (1.803 m), SpO2 100 %.    Vent Mode: PRVC FiO2 (%):  [24 %-100 %] 100 % Set Rate:  [18 bmp] 18 bmp Vt Set:  [450 mL-560 mL] 560 mL PEEP:  [5 cmH20] 5 cmH20 Plateau Pressure:  [9 cmH20] 9 cmH20  No intake or output data in the 24 hours ending 04/26/21 1357 There were no vitals filed for this visit.   Examination: General:   critically ill appearing on mech vent HEENT: MM pink/moist; ETT in place Neuro: sedated; PERRL; 2 mm pupils b/l sluggish to light CV: s1s2, no m/r/g PULM:  dim clear BS bilaterally; on mech vent PRVC GI: soft, bsx4 active  Extremities: warm/dry, no edema  Skin: no rashes or lesions    Resolved Hospital Problem list     Assessment & Plan:  Status epilepticus: CT head 12/22: no intracranial abnormality Acute encephalopathy Hx of polysubstance abuse and tobacco abuse: UDS positive THC Hx of seizures: on keppra and lamictal at home P: -transferring from [facility] to [facility] to neuro ICU -will consult neuro -EEG monitoring -continue Keppra -consider starting home lamictal -prn versed for seizures -on propofol/fentanyl for sedation while intubated -frequent neuro checks -seizure precautions in place -avoid fevers -ethanol pending  Acute respiratory failure due to above: intubated and trached at last admission back in 01/2021; has since been decannulated Possible hx of asthma/COPD: takes  pulmicort, brovana, yupelri at home P: -continue mech vent 6-8 cc/kg -check abg; adjust settings accordingly -wean fio2 for sats >92% -vap prevention in place -rass goal 0 to -1 -continue pulmicort, brovana, yupelri nebs -prn albuterol for wheezing -CXR in am  Lactic/AG metabolic acidosis P: -trend lactate -ethanol level pending -repeat  abg  Hyperglycemia P: -ssi and cbg monitoring -check a1c  Leukocytosis P: -minimal atelectasis on cxr -trend lactate -trend fever/wbc curve  Hx of bipolar, depression, schizophrenia P: -will hold home meds abilify, benztropine, and olanzapine -will hold off on remeron and ativan due to sedating effects  HTN P: -prn labetalol -restart home amlodipine in am  GERD P: -PPI  Best Practice (right click and "Reselect all SmartList Selections" daily)   Diet/type: NPO w/ meds via tube DVT prophylaxis: LMWH GI prophylaxis: PPI Lines: N/A Foley:  N/A Code Status:  full code Last date of multidisciplinary goals of care discussion [pending]  Labs   CBC: Recent Labs  Lab 04/26/21 0827  WBC 13.0*  NEUTROABS 7.2  HGB 13.4  HCT 45.2  MCV 94.8  PLT 281     Basic Metabolic Panel: Recent Labs  Lab 04/26/21 0827  NA 138  K 3.5  CL 108  CO2 11*  GLUCOSE 172*  BUN 13  CREATININE 0.93  CALCIUM 9.4  MG 2.0    GFR: Estimated Creatinine Clearance: 105.7 mL/min (by C-G formula based on SCr of 0.93 mg/dL). Recent Labs  Lab 04/26/21 0827 04/26/21 1002  WBC 13.0*  --   LATICACIDVEN  --  6.0*     Liver Function Tests: Recent Labs  Lab 04/26/21 0827  AST 36  ALT 19  ALKPHOS 78  BILITOT 0.4  PROT 7.9  ALBUMIN 4.2    No results for input(s): LIPASE, AMYLASE in the last 168 hours. No results for input(s): AMMONIA in the last 168 hours.  ABG    Component Value Date/Time   PHART 7.25 (L) 04/26/2021 0845   PCO2ART 43 04/26/2021 0845   PO2ART 87 04/26/2021 0845   HCO3 18.9 (L) 04/26/2021 0845   TCO2 36 (H) 12/31/2020 2242   ACIDBASEDEF 8.1 (H) 04/26/2021 0845   O2SAT 94.9 04/26/2021 0845      Coagulation Profile: No results for input(s): INR, PROTIME in the last 168 hours.  Cardiac Enzymes: No results for input(s): CKTOTAL, CKMB, CKMBINDEX, TROPONINI in the last 168 hours.  HbA1C: Hgb A1c MFr Bld  Date/Time Value Ref Range Status  11/20/2019  12:50 AM 5.8 (H) 4.8 - 5.6 % Final    Comment:    (NOTE) Pre diabetes:          5.7%-6.4%  Diabetes:              >6.4%  Glycemic control for   <7.0% adults with diabetes     CBG: No results for input(s): GLUCAP in the last 168 hours.  Review of Systems:   Unable to obtain; patient intubated/sedated  Past Medical History:  He,  has a past medical history of Bipolar disorder ([place]), Depression, Schizophrenia ([place]), and Seizures ([place]).   Surgical History:   Past Surgical History:  Procedure Laterality Date   FINGER SURGERY     IR GASTROSTOMY TUBE MOD SED  01/16/2021   LEFT HEART CATH AND CORONARY ANGIOGRAPHY N/A 12/25/2020   Procedure: LEFT HEART CATH AND CORONARY ANGIOGRAPHY;  Surgeon: Nelva Bush, MD;  Location: [place] CV LAB;  Service: Cardiovascular;  Laterality: N/A;     Social History:  reports that he has been smoking. He has never used smokeless tobacco. He reports current drug use. Drugs: Marijuana and Cocaine. He reports that he does not drink alcohol.   Family History:  His family history is not on file.   Allergies No Known Allergies   Home Medications  Prior to Admission medications   Medication Sig Start Date End Date Taking? Authorizing Provider  lamoTRIgine (LAMICTAL) 25 MG tablet Wk1:1tab night wk2:1tab 2xday wk3:1tab AM 2tabsPM wk4:2tabs 2xday wk5:2tabs AM 3tabs PM wk6:3tabs 2xday wk7:3tabs AM 4tabs PM, wk8:4tab2xday 04/23/21  Yes [provider]  acetaminophen (TYLENOL) 160 MG/5ML solution Place 20.3 mLs (650 mg total) into feeding tube every 4 (four) hours as needed for mild pain (temp > 101.5). 01/16/21   Lestine Mount, PA-C  albuterol (PROVENTIL) (2.5 MG/3ML) 0.083% nebulizer solution Take 3 mLs (2.5 mg total) by nebulization every 4 (four) hours as needed for wheezing or shortness of breath. 01/16/21   Lestine Mount, PA-C  amLODipine (NORVASC) 10 MG tablet Place 1 tablet (10 mg total) into feeding tube daily. Patient not  taking: Reported on 04/26/2021 01/17/21   Lestine Mount, PA-C  arformoterol [facility]) 15 MCG/2ML NEBU Take 2 mLs (15 mcg total) by nebulization 2 (two) times daily. Patient not taking: Reported on 04/26/2021 01/16/21   Lestine Mount, PA-C  ARIPiprazole (ABILIFY) 10 MG tablet Place 1 tablet (10 mg total) into feeding tube daily. Patient not taking: Reported on 04/26/2021 01/17/21   Lestine Mount, PA-C  ARIPiprazole (ABILIFY) 5 MG tablet Take 2 tablets by G-TUBE daily Patient not taking: Reported on 04/26/2021 03/17/21     ARIPiprazole ER (ABILIFY MAINTENA) 400 MG PRSY prefilled syringe Inject 400 mg into the muscle every 30 (thirty) days. Resume after discharge from hospital. Do not take concurrently with PO/VT Abilify. 01/16/21   Lestine Mount, PA-C  benztropine (COGENTIN) 0.5 MG tablet Take 1 tablet (0.5 mg total) by mouth 2 (two) times daily. Resume on discharge from hospital. 01/16/21   Lestine Mount, PA-C  benztropine (COGENTIN) 1 MG tablet Take 1/2 tablet by G-TUBE twice daily. Patient not taking: Reported on 04/26/2021 03/17/21     budesonide (PULMICORT) 0.25 MG/2ML nebulizer solution Take 2 mLs (0.25 mg total) by nebulization 2 (two) times daily. Patient not taking: Reported on 04/26/2021 01/16/21   Lestine Mount, PA-C  clonazePAM (KLONOPIN) 0.5 MG tablet Take 2 tablets by G-TUBE every 8 hours as needed for anxiety. 03/17/21     clonazePAM (KLONOPIN) 1 MG disintegrating tablet Place 1 tablet (1 mg total) into feeding tube every 8 (eight) hours. Patient not taking: Reported on 04/26/2021 01/16/21   Lestine Mount, PA-C  enoxaparin (LOVENOX) 40 MG/0.4ML injection Inject 0.4 mLs (40 mg total) into the skin daily. Patient not taking: Reported on 04/26/2021 01/17/21   Lestine Mount, PA-C  guaiFENesin (CHEST CONGESTION RELIEF) 100 MG/5ML liquid Give 10 mls via g-tube every 4 hours as directed 03/18/21     guaiFENesin-dextromethorphan (ROBITUSSIN DM) 100-10 MG/5ML  syrup Place 5 mLs into feeding tube every 4 (four) hours as needed for cough. 01/16/21   Lestine Mount, PA-C  haloperidol (HALDOL) 1 MG tablet Take 5 tablets by G-TUBE 3 times daily. 03/17/21     lansoprazole (PREVACID SOLUTAB) 30 MG disintegrating tablet Take 1 tablet by G-TUBE daily 03/17/21     levETIRAcetam (KEPPRA) 100 MG/ML solution Place 15 mLs (1,500 mg total) into feeding tube every 12 (twelve) hours. 01/16/21   Lestine Mount,  PA-C  levETIRAcetam (KEPPRA) 1000 MG tablet Take 1,500 mg by mouth daily. 04/25/21   [provider]  LORazepam (ATIVAN) 2 MG/ML injection Inject 0.5 mLs (1 mg total) into the vein every 6 (six) hours as needed for anxiety. Patient not taking: Reported on 04/26/2021 01/16/21   Lestine Mount, PA-C  metoprolol tartrate (LOPRESSOR) 100 MG tablet Place 1 tablet (100 mg total) into feeding tube 2 (two) times daily. Patient not taking: Reported on 04/26/2021 01/16/21   Lestine Mount, PA-C  mirtazapine (REMERON) 15 MG tablet Take 1/2 tablet via G-TUBE nightly at bedtime 03/17/21     Nutritional Supplements (FEEDING SUPPLEMENT, PROSOURCE TF,) liquid Place 45 mLs into feeding tube 3 (three) times daily. 01/16/21   Lestine Mount, PA-C  Nutritional Supplements (FEEDING SUPPLEMENT, VITAL 1.5 CAL,) LIQD Place 1,000 mLs into feeding tube continuous. 01/16/21   Lestine Mount, PA-C  OLANZapine (ZYPREXA) 5 MG tablet Take 1 tablet by G-TUBE daily 03/17/21     oxyCODONE (OXY IR/ROXICODONE) 5 MG immediate release tablet Place 1 tablet (5 mg total) into feeding tube every 4 (four) hours as needed for moderate pain or severe pain. Patient not taking: Reported on 04/26/2021 01/16/21   Lestine Mount, PA-C  scopolamine (TRANSDERM-SCOP) 1 MG/3DAYS Place 1 film to hairless areas behind one ear every 3 days for 14 days.  Remove old patch, fold & discard away from children & pets. 03/17/21        Critical care time: 45 minutes     JD Rexene Agent Ettrick  Pulmonary & Critical Care 04/26/2021, 1:57 PM  Please see [email or web address] for pager details.  From 7A-7P if no response, please call [phone]. After hours, please call ELink [phone].

## 2021-04-26 NOTE — Progress Notes (Signed)
RT assisted with patient transport to CT and back to ER, while patient on the Trilogy ventilator, with no complications. ABG obtained once patient back and settled into room.

## 2021-04-26 NOTE — Progress Notes (Signed)
eLink Physician-Brief Progress Note Patient Name: Luis Hunter DOB: 1975-12-08 MRN: [id number]   Date of Service  04/26/2021  HPI/Events of Note  Pt reaching for ETT. May he get Bilat wrist restraints and PRN fent changed to injections instead of bolus via infusion   eICU Interventions  Bilateral wrist restraints ordered and Fentanyl shifted to injections     Intervention Category Minor Interventions: Agitation / anxiety - evaluation and management  Judd Lien 04/26/2021, 9:39 PM

## 2021-04-26 NOTE — Progress Notes (Signed)
°  Transition of Care [facility]) Screening Note   Patient Details  Name: GERMAINE SHENKER Date of Birth: [birth year]   Transition of Care [facility]) CM/SW Contact:    Benard Halsted, LCSW Phone Number: 04/26/2021, 4:46 PM    Transition of Care Department [facility]) has reviewed patient and no TOC needs have been identified at this time. We will continue to monitor patient advancement through interdisciplinary progression rounds. If new patient transition needs arise, please place a TOC consult.

## 2021-04-26 NOTE — ED Notes (Signed)
Pt is moving around coughing over vent. Arms and Legs are moving. HR and B/P elevated. Charge RN and MD at bedside at this time. Medication ordered. See MAR for details.

## 2021-04-26 NOTE — ED Provider Notes (Signed)
[facility] Emergency Department Provider Note ____________________________________________   Event Date/Time   First MD Initiated Contact with Patient 04/26/21 0825     (approximate)  I have reviewed the triage vital signs and the nursing notes.  HISTORY  Chief Complaint Seizures   HPI Luis Hunter is a 45 y.o. malewho presents to the ED for evaluation of seizure  Chart review indicates history of bipolar disorder, schizophrenia and seizure disorder.  Follows with [facility] clinic neurology.  Looks like he is on Keppra and Lamictal.  Patient presents to the ED via EMS from home for evaluation of seizure and altered mentation.  EMS was called to the house due to seizure activity where he and his girlfriend were sleeping.  He was postictal on their arrival, but was noted to have multiple episodes of seizure-like activity with EMS requiring up to 4 mg of IV Versed.  EMS reports generalized tonic-clonic activity.  Eyes deviated to the left.  Patient presents to the ED with sonorous respirations without obvious active seizure activity.  Eyes are deviated to the left, and he is reaching up with his right hand to scratch at his face, and also uses his right hand to pull down his underwear to pee on himself.  He is not responsive to external stimuli and requires intubation on arrival to the ED for airway protection.  History limited due to acuity of condition and mentation on arrival.  Past Medical History:  Diagnosis Date   Bipolar disorder ([place])    Depression    Schizophrenia ([place])    Seizures ([place])     Patient Active Problem List   Diagnosis Date Noted   Acute on chronic respiratory failure with hypoxia ([place]) 01/18/2021   Status epilepticus ([place]) 01/18/2021   Polysubstance abuse ([place]) 01/18/2021   Healthcare-associated pneumonia 02/72/5366   Acute metabolic encephalopathy 44/07/4740   Pressure injury of skin 01/13/2021   Acute respiratory failure with  hypercapnia (HCC)    Encephalopathy acute    ST elevation myocardial infarction (STEMI) ([place])    Status epilepticus ([place]) 12/16/2020   Polysubstance abuse ([place]) 11/19/2019   Schizophrenia ([place])    Bipolar disorder ([place])    Elevated troponin    Unresponsiveness    Tobacco abuse    Leukocytosis    Seizure ([place]) 06/05/2017   BPPV (benign paroxysmal positional vertigo) 11/16/2015   Dizziness 11/16/2015    Past Surgical History:  Procedure Laterality Date   FINGER SURGERY     IR GASTROSTOMY TUBE MOD SED  01/16/2021   LEFT HEART CATH AND CORONARY ANGIOGRAPHY N/A 12/25/2020   Procedure: LEFT HEART CATH AND CORONARY ANGIOGRAPHY;  Surgeon: Nelva Bush, MD;  Location: [place] [facility];  Service: Cardiovascular;  Laterality: N/A;    Prior to Admission medications   Medication Sig Start Date End Date Taking? Authorizing Provider  lamoTRIgine (LAMICTAL) 25 MG tablet Wk1:1tab night wk2:1tab 2xday wk3:1tab AM 2tabsPM wk4:2tabs 2xday wk5:2tabs AM 3tabs PM wk6:3tabs 2xday wk7:3tabs AM 4tabs PM, wk8:4tab2xday 04/23/21  Yes [provider]  acetaminophen (TYLENOL) 160 MG/5ML solution Place 20.3 mLs (650 mg total) into feeding tube every 4 (four) hours as needed for mild pain (temp > 101.5). 01/16/21   Lestine Mount, PA-C  albuterol (PROVENTIL) (2.5 MG/3ML) 0.083% nebulizer solution Take 3 mLs (2.5 mg total) by nebulization every 4 (four) hours as needed for wheezing or shortness of breath. 01/16/21   Lestine Mount, PA-C  amLODipine (NORVASC) 10 MG tablet Place 1 tablet (10  mg total) into feeding tube daily. Patient not taking: Reported on 04/26/2021 01/17/21   Lestine Mount, PA-C  arformoterol [facility]) 15 MCG/2ML NEBU Take 2 mLs (15 mcg total) by nebulization 2 (two) times daily. Patient not taking: Reported on 04/26/2021 01/16/21   Lestine Mount, PA-C  ARIPiprazole (ABILIFY) 10 MG tablet Place 1 tablet (10 mg total) into feeding tube daily. Patient not taking: Reported  on 04/26/2021 01/17/21   Lestine Mount, PA-C  ARIPiprazole (ABILIFY) 5 MG tablet Take 2 tablets by G-TUBE daily Patient not taking: Reported on 04/26/2021 03/17/21     ARIPiprazole ER (ABILIFY MAINTENA) 400 MG PRSY prefilled syringe Inject 400 mg into the muscle every 30 (thirty) days. Resume after discharge from hospital. Do not take concurrently with PO/VT Abilify. 01/16/21   Lestine Mount, PA-C  benztropine (COGENTIN) 0.5 MG tablet Take 1 tablet (0.5 mg total) by mouth 2 (two) times daily. Resume on discharge from hospital. 01/16/21   Lestine Mount, PA-C  benztropine (COGENTIN) 1 MG tablet Take 1/2 tablet by G-TUBE twice daily. Patient not taking: Reported on 04/26/2021 03/17/21     budesonide (PULMICORT) 0.25 MG/2ML nebulizer solution Take 2 mLs (0.25 mg total) by nebulization 2 (two) times daily. Patient not taking: Reported on 04/26/2021 01/16/21   Lestine Mount, PA-C  clonazePAM (KLONOPIN) 0.5 MG tablet Take 2 tablets by G-TUBE every 8 hours as needed for anxiety. 03/17/21     clonazePAM (KLONOPIN) 1 MG disintegrating tablet Place 1 tablet (1 mg total) into feeding tube every 8 (eight) hours. Patient not taking: Reported on 04/26/2021 01/16/21   Lestine Mount, PA-C  enoxaparin (LOVENOX) 40 MG/0.4ML injection Inject 0.4 mLs (40 mg total) into the skin daily. Patient not taking: Reported on 04/26/2021 01/17/21   Lestine Mount, PA-C  guaiFENesin (CHEST CONGESTION RELIEF) 100 MG/5ML liquid Give 10 mls via g-tube every 4 hours as directed 03/18/21     guaiFENesin-dextromethorphan (ROBITUSSIN DM) 100-10 MG/5ML syrup Place 5 mLs into feeding tube every 4 (four) hours as needed for cough. 01/16/21   Lestine Mount, PA-C  haloperidol (HALDOL) 1 MG tablet Take 5 tablets by G-TUBE 3 times daily. 03/17/21     lansoprazole (PREVACID SOLUTAB) 30 MG disintegrating tablet Take 1 tablet by G-TUBE daily 03/17/21     levETIRAcetam (KEPPRA) 100 MG/ML solution Place 15 mLs (1,500 mg  total) into feeding tube every 12 (twelve) hours. 01/16/21   Lestine Mount, PA-C  levETIRAcetam (KEPPRA) 1000 MG tablet Take 1,500 mg by mouth daily. 04/25/21   [provider]  LORazepam (ATIVAN) 2 MG/ML injection Inject 0.5 mLs (1 mg total) into the vein every 6 (six) hours as needed for anxiety. Patient not taking: Reported on 04/26/2021 01/16/21   Lestine Mount, PA-C  metoprolol tartrate (LOPRESSOR) 100 MG tablet Place 1 tablet (100 mg total) into feeding tube 2 (two) times daily. Patient not taking: Reported on 04/26/2021 01/16/21   Lestine Mount, PA-C  mirtazapine (REMERON) 15 MG tablet Take 1/2 tablet via G-TUBE nightly at bedtime 03/17/21     Nutritional Supplements (FEEDING SUPPLEMENT, PROSOURCE TF,) liquid Place 45 mLs into feeding tube 3 (three) times daily. 01/16/21   Lestine Mount, PA-C  Nutritional Supplements (FEEDING SUPPLEMENT, VITAL 1.5 CAL,) LIQD Place 1,000 mLs into feeding tube continuous. 01/16/21   Lestine Mount, PA-C  OLANZapine (ZYPREXA) 5 MG tablet Take 1 tablet by G-TUBE daily 03/17/21     oxyCODONE (OXY IR/ROXICODONE) 5 MG immediate release  tablet Place 1 tablet (5 mg total) into feeding tube every 4 (four) hours as needed for moderate pain or severe pain. Patient not taking: Reported on 04/26/2021 01/16/21   Lestine Mount, PA-C  scopolamine (TRANSDERM-SCOP) 1 MG/3DAYS Place 1 film to hairless areas behind one ear every 3 days for 14 days.  Remove old patch, fold & discard away from children & pets. 03/17/21       Allergies Patient has no known allergies.  No family history on file.  Social History Social History   Tobacco Use   Smoking status: Every Day   Smokeless tobacco: Never  Substance Use Topics   Alcohol use: No    Alcohol/week: 0.0 standard drinks   Drug use: Yes    Types: Marijuana, Cocaine    Review of Systems  Unable to be accurately assessed due to altered  mentation. ____________________________________________   PHYSICAL EXAM:  VITAL SIGNS: Vitals:   04/26/21 1000 04/26/21 1015  BP: (!) 197/123 (!) 157/101  Pulse: (!) 124 (!) 109  Resp: (!) 31 (!) 29  Temp:    SpO2: 96% 99%     Constitutional: Somnolent with sonorous respirations.  Not responding to external noxious or vocal stimuli. Using his right hand primarily to scratch his face and pulled on his underwear.  No seizure activity noted. Eyes: Conjunctivae are normal.  Pupils are midrange and PERRL, eyes are deviated towards the left.  No nystagmus.  Doll's eye reflex not intact. Head: Atraumatic. Nose: No congestion/rhinnorhea. Mouth/Throat: Mucous membranes are moist.  Oropharynx non-erythematous.  Foamy clear sputum coming from the mouth Neck: No stridor. No cervical spine tenderness to palpation. Cardiovascular: Tachycardic rate, regular rhythm. Grossly normal heart sounds.  Good peripheral circulation. Respiratory: Sonorous respirations without retractions or distress. Gastrointestinal: Soft , nondistended, no apparent tenderness to palpation prior to intubation.. Musculoskeletal: No joint effusions. No signs of acute trauma. Neurologic: E1V1M3 GCS of 5.  No obvious seizure activity prior to intubation. No clonus to bilateral ankle flexion. Eyes deviated to the left, as documented above. Skin:  Skin is warm, dry and intact. No rash noted. Psychiatric: Mood and affect are unable to be assessed ____________________________________________   LABS (all labs ordered are listed, but only abnormal results are displayed)  Labs Reviewed  COMPREHENSIVE METABOLIC PANEL - Abnormal; Notable for the following components:      Result Value   CO2 11 (*)    Glucose, Bld 172 (*)    Anion gap 19 (*)    All other components within normal limits  CBC WITH DIFFERENTIAL/PLATELET - Abnormal; Notable for the following components:   WBC 13.0 (*)    MCHC 29.6 (*)    RDW 15.9 (*)    Lymphs  Abs 4.2 (*)    Monocytes Absolute 1.1 (*)    Abs Immature Granulocytes 0.12 (*)    All other components within normal limits  BLOOD GAS, ARTERIAL - Abnormal; Notable for the following components:   pH, Arterial 7.25 (*)    Bicarbonate 18.9 (*)    Acid-base deficit 8.1 (*)    All other components within normal limits  URINE DRUG SCREEN, QUALITATIVE ([facility] ONLY) - Abnormal; Notable for the following components:   Cannabinoid 50 Ng, Ur [place] POSITIVE (*)    Benzodiazepine, Ur Scrn POSITIVE (*)    All other components within normal limits  ACETAMINOPHEN LEVEL - Abnormal; Notable for the following components:   Acetaminophen (Tylenol), Serum <10 (*)    All other components within normal limits  SALICYLATE LEVEL - Abnormal; Notable for the following components:   Salicylate Lvl <9.1 (*)    All other components within normal limits  RESP PANEL BY RT-PCR (FLU A&B, COVID) ARPGX2  MAGNESIUM  URINALYSIS, COMPLETE (UACMP) WITH MICROSCOPIC  LACTIC ACID, PLASMA  LACTIC ACID, PLASMA  LAMOTRIGINE LEVEL  LEVETIRACETAM LEVEL   ____________________________________________  12 Lead EKG   ____________________________________________  RADIOLOGY  ED MD interpretation:  CXR reviewed by me without evidence of acute cardiopulmonary pathology.  Ct head reviewed by me without evidence of acute intracranial pathology.  Official radiology report(s): CT HEAD WO CONTRAST (5MM)  Result Date: 04/26/2021 CLINICAL DATA:  status epilepticus EXAM: CT HEAD WITHOUT CONTRAST TECHNIQUE: Contiguous axial images were obtained from the base of the skull through the vertex without intravenous contrast. COMPARISON:  01/23/2021 FINDINGS: Brain: No acute intracranial abnormality. Specifically, no hemorrhage, hydrocephalus, mass lesion, acute infarction, or significant intracranial injury. Vascular: No hyperdense vessel or unexpected calcification. Skull: No acute calvarial abnormality. Sinuses/Orbits: No acute findings  Other: None IMPRESSION: No acute intracranial abnormality. Electronically Signed   By: Rolm Baptise M.D.   On: 04/26/2021 09:30   DG Chest Portable 1 View  Result Date: 04/26/2021 CLINICAL DATA:  Post intubation in a 45 year old male. EXAM: PORTABLE CHEST 1 VIEW COMPARISON:  Comparison is made with exam from February 13, 2021. FINDINGS: Endotracheal tube approximately 6 cm from the carina, tip just below the clavicular heads. Gastric tube coiled in the stomach. EKG leads project over the chest. Cardiomediastinal contours and hilar structures are normal. Minimal basilar opacities. No lobar consolidation. No sign of effusion on frontal radiograph or visible pneumothorax. On limited assessment there is no acute skeletal process. IMPRESSION: Endotracheal tube approximately 6 cm from the carina, tip just below the clavicular heads in the proximal to mid trachea. Minimal basilar opacities, may reflect atelectasis. No lobar consolidation. Electronically Signed   By: Zetta Bills M.D.   On: 04/26/2021 09:13    ____________________________________________   PROCEDURES and INTERVENTIONS  Procedure(s) performed (including Critical Care):  .1-3 Lead EKG Interpretation Performed by: Vladimir Crofts, MD Authorized by: Vladimir Crofts, MD     Interpretation: abnormal     ECG rate:  120   ECG rate assessment: tachycardic     Rhythm: sinus tachycardia     Ectopy: none     Conduction: normal   .Critical Care Performed by: Vladimir Crofts, MD Authorized by: Vladimir Crofts, MD   Critical care provider statement:    Critical care time (minutes):  30   Critical care time was exclusive of:  Separately billable procedures and treating other patients   Critical care was necessary to treat or prevent imminent or life-threatening deterioration of the following conditions:  Respiratory failure and CNS failure or compromise   Critical care was time spent personally by me on the following activities:  Development of  treatment plan with patient or surrogate, discussions with consultants, evaluation of patient's response to treatment, examination of patient, ordering and review of laboratory studies, ordering and review of radiographic studies, ordering and performing treatments and interventions, pulse oximetry, re-evaluation of patient's condition and review of old charts Procedure Name: Intubation Date/Time: 04/26/2021 10:55 AM Performed by: Vladimir Crofts, MD Pre-anesthesia Checklist: Patient identified, Patient being monitored, Emergency Drugs available, Timeout performed and Suction available Oxygen Delivery Method: Non-rebreather mask Preoxygenation: Pre-oxygenation with 100% oxygen Induction Type: Rapid sequence Ventilation: Mask ventilation without difficulty Laryngoscope Size: Glidescope and 3 Tube size: 7.5 mm Number of attempts: 2 Airway Equipment and  Method: Rigid stylet and Bougie stylet Placement Confirmation: ETT inserted through vocal cords under direct vision, CO2 detector and Breath sounds checked- equal and bilateral Secured at: 24 cm Tube secured with: ETT holder Difficulty Due To: Difficult Airway- due to anterior larynx Comments: Surprisingly difficult airway with quite anterior larynx.  Had to pause to bag and get a bougie to get through the cords. No hypoxia on the monitor throughout this     Medications  propofol (DIPRIVAN) 1000 MG/100ML infusion (40 mcg/kg/min  76.4 kg Intravenous Rate/Dose Change 04/26/21 1022)  fentaNYL 2571mcg in NS 243mL (60mcg/ml) infusion-PREMIX (100 mcg/hr Intravenous New Bag/Given 04/26/21 1050)  lactated ringers bolus 1,000 mL (1,000 mLs Intravenous New Bag/Given 04/26/21 1001)  levETIRAcetam (KEPPRA) IVPB 1500 mg/ 100 mL premix (0 mg Intravenous Stopped 04/26/21 1020)  midazolam (VERSED) 5 MG/5ML injection 5 mg (5 mg Intravenous Given 04/26/21 1040)  fentaNYL (SUBLIMAZE) injection 100 mcg (100 mcg Intravenous Given 04/26/21 1043)     ____________________________________________   MDM / ED COURSE   45 year old male with a seizure disorder presents to the ED postictal with evidence of status epilepticus requiring intubation and transfer to facility with continuous EEG capabilities.  He is tachycardic but remains hemodynamically stable.  Presents with a GCS of about 5 and due to concerns for protection of his airway and mental status, he was intubated.  Intubation was somewhat difficult considering a quite anterior larynx requiring bougie and cricoid pressure.  Work-up with a metabolic acidosis that I suspect is a lactic acidosis due to his recurrent seizures.  CT head without evidence of ICH, mass to precipitate his status.  CXR unrevealing as well.  We will load him with Keppra and keep him sedated with propofol.  No clear seizure activity here in the ER.  Awaiting Keppra and lamotrigine levels from his blood work and he will be transferred to neuro ICU for continuous EEG capabilities.  Clinical Course as of 04/26/21 1054  Thu Apr 26, 2021  0907 Reassessed.  Propofol up to 30 now. ETT advanced 1 cm by RT, now 25 teeth. [DS]  8182 Reassessed. [DS]  9937 Dr. Lynetta Mare. Accepted, to [facility].  [DS]  1696 Reassessed.  Some mild generalized tremulousness, and bucking the vent.  No clonus.  No clear seizure activity, uncertain if he is just fighting the vent.  We will provide some IV Versed and initiate fentanyl bolus and drip as well [DS]    Clinical Course User Index [DS] Vladimir Crofts, MD    ____________________________________________   FINAL CLINICAL IMPRESSION(S) / ED DIAGNOSES  Final diagnoses:  Status epilepticus [facility])     ED Discharge Orders     None        Magdiel Bartles   Note:  This document was prepared using Dragon voice recognition software and may include unintentional dictation errors.    Vladimir Crofts, MD 04/26/21 1100

## 2021-04-26 NOTE — Progress Notes (Signed)
eLink Physician-Brief Progress Note Patient Name: Luis Hunter DOB: 04/25/1976 MRN: [id number]   Date of Service  04/26/2021  HPI/Events of Note  Notified of lactic acid 3.3 from 6 Not on pressors with adequate urine output  eICU Interventions  Lactate improving with no signs of hypoperfusion No change in current therapies     Intervention Category Intermediate Interventions: Other:  Judd Lien 04/26/2021, 7:37 PM

## 2021-04-26 NOTE — Progress Notes (Signed)
EEG complete - results pending 

## 2021-04-26 NOTE — Procedures (Signed)
Patient Name: Luis Hunter  MRN: [id number]  Epilepsy Attending: Lora Havens  Referring Physician/Provider: Mick Sell, PA-C Date: 04/26/2021 Duration: 22.54 mins  Patient history: 45 year old male with history of epilepsy who presented with breakthrough seizures and status epilepticus.  EEG to evaluate for seizure.  Level of alertness: comatose  AEDs during EEG study: Keppra, propofol  Technical aspects: This EEG study was done with scalp electrodes positioned according to the 10-20 International system of electrode placement. Electrical activity was acquired at a sampling rate of 500Hz  and reviewed with a high frequency filter of 70Hz  and a low frequency filter of 1Hz . EEG data were recorded continuously and digitally stored.   Description: EEG showed continuous generalized and maximal right frontotemporal region 3 to 6 Hz theta-delta slowing admixed with 15 to 18 Hz generalized beta activity.  Hyperventilation and photic stimulation were not performed.     ABNORMALITY - Continuous slow, generalized and maximal right frontotemporal region   IMPRESSION: This study is suggestive of cortical dysfunction in right frontotemporal region, nonspecific etiology but could be secondary to underlying structural abnormality, postictal state.  There is also evidence of moderate to severe diffuse encephalopathy, nonspecific etiology but most likely secondary to sedation.  No seizures or epileptiform discharges were seen throughout the recording.  Luis Hunter

## 2021-04-27 ENCOUNTER — Inpatient Hospital Stay (HOSPITAL_COMMUNITY): Payer: Medicare HMO

## 2021-04-27 LAB — CBC
HCT: 38.4 % — ABNORMAL LOW (ref 39.0–52.0)
Hemoglobin: 12.7 g/dL — ABNORMAL LOW (ref 13.0–17.0)
MCH: 29.1 pg (ref 26.0–34.0)
MCHC: 33.1 g/dL (ref 30.0–36.0)
MCV: 88.1 fL (ref 80.0–100.0)
Platelets: 202 10*3/uL (ref 150–400)
RBC: 4.36 MIL/uL (ref 4.22–5.81)
RDW: 15.6 % — ABNORMAL HIGH (ref 11.5–15.5)
WBC: 12.3 10*3/uL — ABNORMAL HIGH (ref 4.0–10.5)
nRBC: 0 % (ref 0.0–0.2)

## 2021-04-27 LAB — BASIC METABOLIC PANEL
Anion gap: 7 (ref 5–15)
BUN: 11 mg/dL (ref 6–20)
CO2: 23 mmol/L (ref 22–32)
Calcium: 9.2 mg/dL (ref 8.9–10.3)
Chloride: 106 mmol/L (ref 98–111)
Creatinine, Ser: 0.76 mg/dL (ref 0.61–1.24)
GFR, Estimated: >60 mL/min (ref >60)
Glucose, Bld: 103 mg/dL — ABNORMAL HIGH (ref 70–99)
Potassium: 4.3 mmol/L (ref 3.5–5.1)
Sodium: 136 mmol/L (ref 135–145)

## 2021-04-27 LAB — TRIGLYCERIDES: Triglycerides: 51 mg/dL (ref <150)

## 2021-04-27 LAB — GLUCOSE, CAPILLARY
Glucose-Capillary: 119 mg/dL — ABNORMAL HIGH (ref 70–99)
Glucose-Capillary: 110 mg/dL — ABNORMAL HIGH (ref 70–99)
Glucose-Capillary: 73 mg/dL (ref 70–99)
Glucose-Capillary: 91 mg/dL (ref 70–99)
Glucose-Capillary: 165 mg/dL — ABNORMAL HIGH (ref 70–99)
Glucose-Capillary: 109 mg/dL — ABNORMAL HIGH (ref 70–99)

## 2021-04-27 LAB — LEVETIRACETAM LEVEL: Levetiracetam Lvl: 24.9 ug/mL (ref 10.0–40.0)

## 2021-04-27 IMAGING — DX DG CHEST 1V PORT
1 series · 1 of 1 positions shown · non-contrast
Comparison: [DATE]

CLINICAL DATA: Acute respiratory failure, endotracheal tube present

EXAM:
PORTABLE CHEST 1 VIEW

[chest ap]
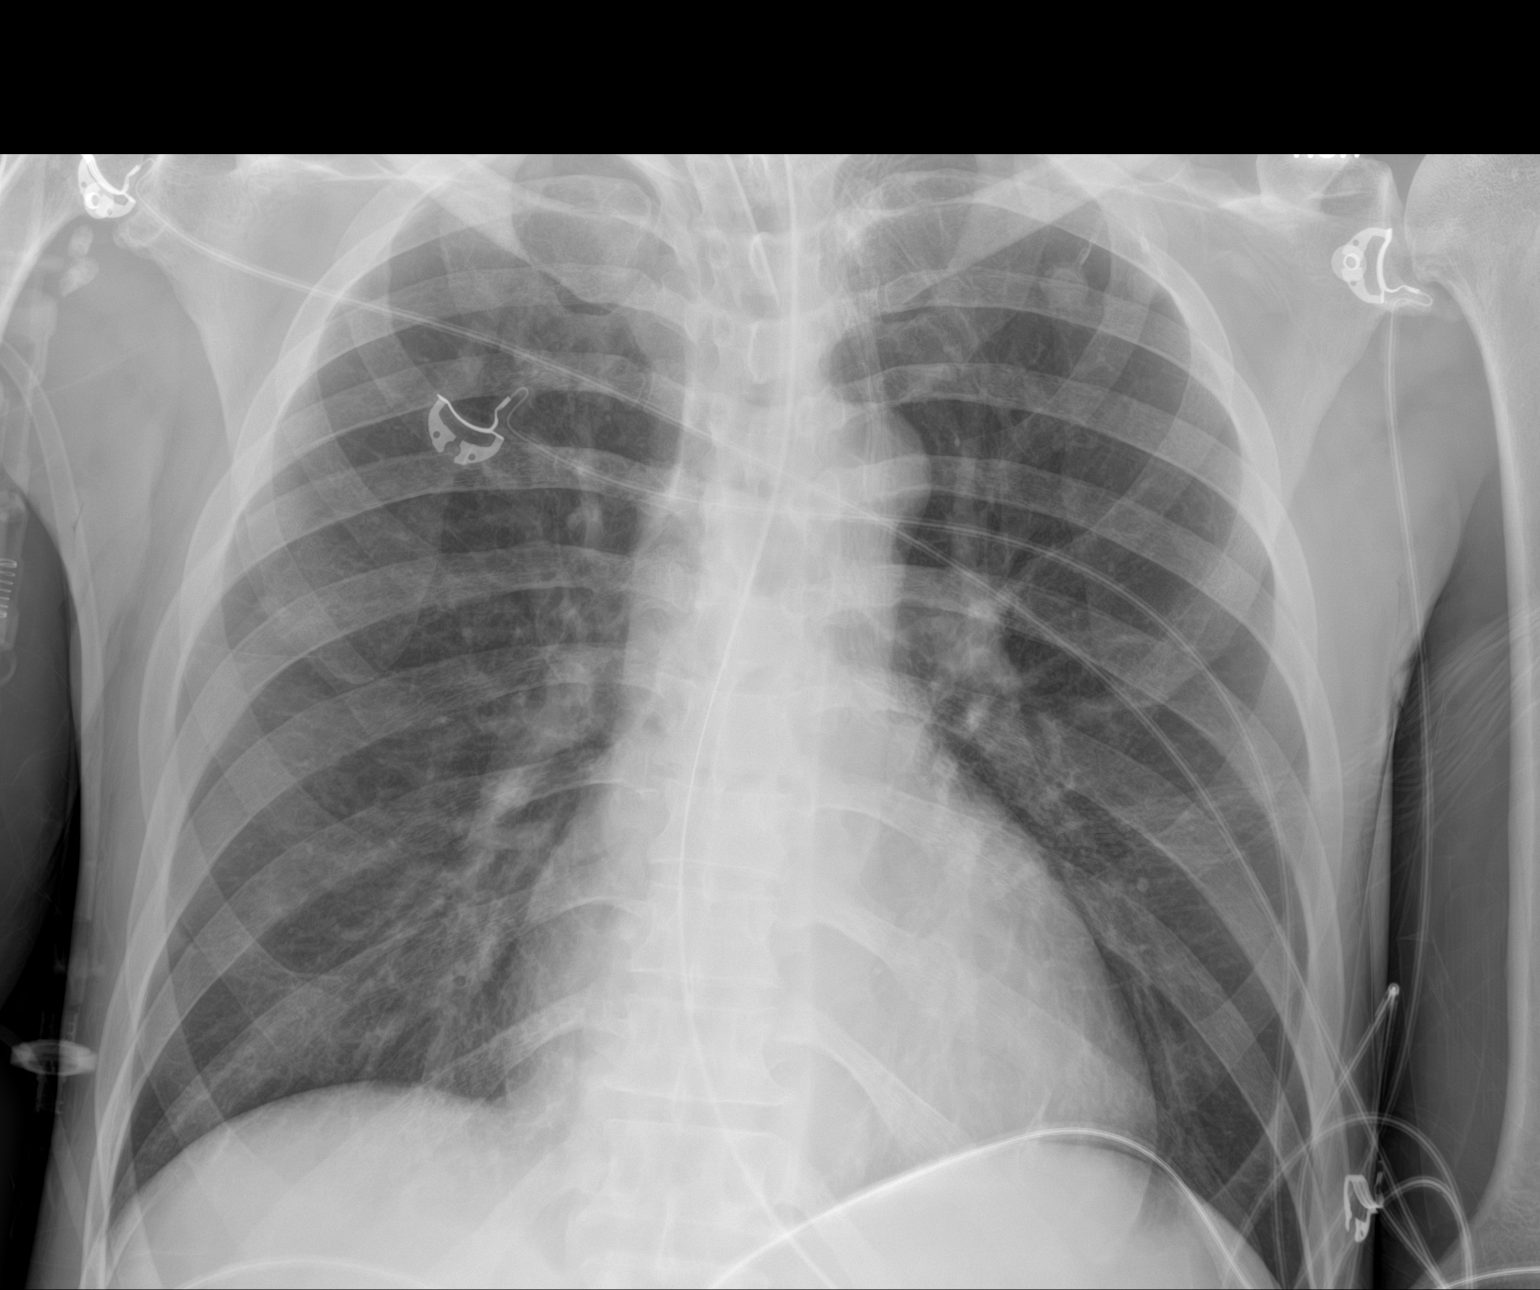

[1 of 1 positions shown; findings below may reference images not displayed]

FINDINGS: Endotracheal tube and enteric tube are again identified. Lungs
remain clear. No pneumothorax or pleural effusion. Normal heart
size.
IMPRESSION: Stable lines and tubes.  No new abnormality.

## 2021-04-27 MED ORDER — LAMOTRIGINE 25 MG PO TABS
25.0000 mg | ORAL_TABLET | Freq: Every day | ORAL | Status: DC
  Administered 2021-04-27 – 2021-04-28 (×2): 25 mg via ORAL
  Filled 2021-04-27 (×2): qty 1

## 2021-04-27 MED ORDER — MIRTAZAPINE 15 MG PO TABS
15.0000 mg | ORAL_TABLET | Freq: Every day | ORAL | Status: DC
  Administered 2021-04-27: 23:00:00 15 mg via ORAL
  Filled 2021-04-27: qty 1

## 2021-04-27 MED ORDER — LACTATED RINGERS IV BOLUS
500.0000 mL | Freq: Once | INTRAVENOUS | Status: AC
  Administered 2021-04-27: 09:00:00 500 mL via INTRAVENOUS

## 2021-04-27 MED ORDER — AMLODIPINE BESYLATE 10 MG PO TABS
10.0000 mg | ORAL_TABLET | Freq: Every day | ORAL | Status: DC
  Administered 2021-04-28: 10:00:00 10 mg via ORAL
  Filled 2021-04-27: qty 1

## 2021-04-27 MED ORDER — POLYETHYLENE GLYCOL 3350 17 G PO PACK: 17.0000 g | PACK | Freq: Every day | ORAL | Status: DC

## 2021-04-27 MED ORDER — METOPROLOL TARTRATE 50 MG PO TABS
100.0000 mg | ORAL_TABLET | Freq: Two times a day (BID) | ORAL | Status: DC
  Administered 2021-04-27 – 2021-04-28 (×2): 100 mg via ORAL
  Filled 2021-04-27 (×2): qty 2

## 2021-04-27 MED ORDER — PANTOPRAZOLE 2 MG/ML SUSPENSION
40.0000 mg | Freq: Every day | ORAL | Status: DC
  Administered 2021-04-28: 10:00:00 40 mg via ORAL
  Filled 2021-04-27: qty 20

## 2021-04-27 MED ORDER — ARIPIPRAZOLE 10 MG PO TABS
10.0000 mg | ORAL_TABLET | Freq: Every day | ORAL | Status: DC
  Administered 2021-04-27 – 2021-04-28 (×2): 10 mg via ORAL
  Filled 2021-04-27 (×2): qty 1

## 2021-04-27 MED ORDER — ACETAMINOPHEN 80 MG PO CHEW
80.0000 mg | CHEWABLE_TABLET | Freq: Four times a day (QID) | ORAL | Status: DC | PRN
  Filled 2021-04-27: qty 1

## 2021-04-27 MED ORDER — METOPROLOL TARTRATE 50 MG PO TABS
100.0000 mg | ORAL_TABLET | Freq: Two times a day (BID) | ORAL | Status: DC
  Administered 2021-04-27: 09:00:00 100 mg
  Filled 2021-04-27: qty 2

## 2021-04-27 MED ORDER — DOCUSATE SODIUM 50 MG/5ML PO LIQD
100.0000 mg | Freq: Two times a day (BID) | ORAL | Status: DC
  Administered 2021-04-27: 23:00:00 100 mg via ORAL
  Filled 2021-04-27: qty 10

## 2021-04-27 NOTE — Progress Notes (Signed)
Subjective: No seizures overnight.  Patient was extubated this morning.  States he was taking Keppra and lamotrigine.  ROS: negative except above  Examination  Vital signs in last 24 hours: Temp:  [96.4 F (35.8 C)-99.3 F (37.4 C)] 98.6 F (37 C) (12/23 0812) Pulse Rate:  [63-89] 63 (12/23 0946) Resp:  [14-23] 14 (12/23 0946) BP: (83-158)/(57-111) 158/105 (12/23 0946) SpO2:  [100 %] 100 % (12/23 0946) FiO2 (%):  [40 %-100 %] 40 % (12/23 0812)  General: lying in bed, NAD CVS: pulse-normal rate and rhythm RS: breathing comfortably, CTAB Extremities: normal   Neuro: MS: Alert, oriented, follows commands CN: pupils equal and reactive,  EOMI, face symmetric Motor: Antigravity strength in all 4 extremities  Basic Metabolic Panel: Recent Labs  Lab 04/26/21 0827 04/26/21 1453 04/26/21 1658 04/27/21 0507  NA 138 138  --  136  K 3.5 3.7  --  4.3  CL 108  --   --  106  CO2 11*  --   --  23  GLUCOSE 172*  --   --  103*  BUN 13  --   --  11  CREATININE 0.93  --  0.73 0.76  CALCIUM 9.4  --   --  9.2  MG 2.0  --   --   --     CBC: Recent Labs  Lab 04/26/21 0827 04/26/21 1453 04/26/21 1658 04/27/21 0507  WBC 13.0*  --  15.3* 12.3*  NEUTROABS 7.2  --   --   --   HGB 13.4 12.9* 12.3* 12.7*  HCT 45.2 38.0* 38.3* 38.4*  MCV 94.8  --  89.7 88.1  PLT 281  --  189 202     Coagulation Studies: No results for input(s): LABPROT, INR in the last 72 hours.  Imaging No new brain imaging overnight  ASSESSMENT AND PLAN: 45 year old male with history of epilepsy who presented with breakthrough seizures and status epilepticus.   Status epilepticus, convulsive Epilepsy with breakthrough seizures Cannabis use disorder Hyperammonemia -Etiology of breakthrough seizure: Suspected medication noncompliance.  Keppra and lamotrigine levels pending.  No evidence of infection   Recommendations: -DC LTM and no further seizures overnight -Continue Keppra 1000 mg twice daily.  Keppra  lamotrigine level still pending.  If Keppra level subtherapeutic, will plan to increase Keppra.  However, patient was noncompliant, then we can continue current dose of Keppra -Per review of Dr. Trena Platt note, plan was to transition him to lamotrigine.  Again, as we are unsure if patient was compliant overnight, for now I will start patient on lamotrigine 25 mg daily.  Depending on lamotrigine levels can consider adjusting dose further -Continue seizure precautions -As needed IV Ativan 2 mg for clinical seizure-like activity -Management of rest of comorbidities per primary team - Discussed plan via secure chat with critical care team.   Thank you for allowing [place] to participate in the care of this patient. If you have any further questions, please contact  me or neurohospitalist.    I have spent a total of  26 minutes with the patient reviewing hospital notes,  test results, labs and examining the patient as well as establishing an assessment and plan that was discussed personally with the patient.  > 50% of time was spent in direct patient care.        Zeb Comfort Epilepsy [facility] For questions after 5pm please refer to AMION to reach the Neurologist on call

## 2021-04-27 NOTE — Procedures (Addendum)
Patient Name: Luis Hunter  MRN: [id number]  Epilepsy Attending: Lora Havens  Referring Physician/Provider: Dr Zeb Comfort Duration: 04/26/2021 1453 to 04/27/2021 1011   Patient history: 45 year old male with history of epilepsy who presented with breakthrough seizures and status epilepticus.  EEG to evaluate for seizure.   Level of alertness: lethargic   AEDs during EEG study: Keppra   Technical aspects: This EEG study was done with scalp electrodes positioned according to the 10-20 International system of electrode placement. Electrical activity was acquired at a sampling rate of 500Hz  and reviewed with a high frequency filter of 70Hz  and a low frequency filter of 1Hz . EEG data were recorded continuously and digitally stored.    Description: EEG showed continuous generalized 6 to 10 Hz theta-alpha activity admixed with intermittent generalized 2 to 3 Hz delta slowing. Hyperventilation and photic stimulation were not performed.      ABNORMALITY - Continuous slow, generalized     IMPRESSION: This study is suggestive of moderate diffuse encephalopathy diffuse encephalopathy, nonspecific etiology but most likely secondary to sedation.  No seizures or epileptiform discharges were seen throughout the recording.   Luis Hunter

## 2021-04-27 NOTE — Progress Notes (Signed)
PT Cancellation Note  Patient Details Name: Luis Hunter MRN: [id number] DOB: 1976/02/21   Cancelled Treatment:    Reason Eval/Treat Not Completed: Patient not medically ready. Pt intubated and sedated. Plans to wean sedation and extubate later this morning. PT to re-attempt as time allows.   Lorriane Shire 04/27/2021, 8:44 AM  Lorrin Goodell, PT  Office # [phone] Pager [phone]

## 2021-04-27 NOTE — Progress Notes (Signed)
EEG maintenance performed.  No skin breakdown observed at electrode sites F7, Fp2.

## 2021-04-27 NOTE — Procedures (Signed)
Extubation Procedure Note  Patient Details:   Name: Luis Hunter DOB: 1976-04-13 MRN: [id number]   Airway Documentation:    Vent end date: 04/27/21 Vent end time: 0940   Evaluation  O2 sats: stable throughout Complications: No apparent complications Patient did tolerate procedure well. Bilateral Breath Sounds: Clear   Yes  Pt extubated to [place] with RN at bedside. Positive cuff leak noted. Pt is tolerating well. RT will continue to monitor.  Cathie Olden 04/27/2021, 9:48 AM

## 2021-04-27 NOTE — Progress Notes (Signed)
NAME:  Luis GLANDER, MRN:  [id number], DOB:  [birth year], LOS: 1 ADMISSION DATE:  04/26/2021, CONSULTATION DATE:  12/22 REFERRING MD:  Dr. Tamala Julian, CHIEF COMPLAINT:  status epilepticus   History of Present Illness:  Patient is a 45 year old male with pertinent PMH of status epilepticus, polysubstance abuse, schizophrenia, depression, bipolar presents to [facility] ED on 12/22 with status epilepticus.  Patient has had a recent admission to [facility] with status epilepticus and was intubated back in 12/2020.  Patient was found to be positive for cocaine and THC.  Patient was unable to be weaned off ventilator and was trached and was discharged on 01/2021.  On 12/22, patient's girlfriend called EMS due to seizure and AMS.  Upon EMS arrival, patient was postictal and was noted to have multiple episodes of seizure-like activity.  Patient required 4 mg of IV Versed.  Seizures appeared generalized tonic clonic activity with eye deviation to the left.  Patient arrived to [facility] ED.  Patient had agonal respirations and required intubation for airway protection.  CT head negative.  Started on propofol for sedation.  Given Keppra.  UDS positive for THC. Ethanol pending.  Acetaminophen and salicylate level negative. Keppra and lamotrigine level pending glucose 171.  Mild leukocytosis but afebrile.  CXR with bilateral small atelectasis.  Lactic acid pending.  does have anion gap metabolic acidosis.  PCCM consulted for transfer to [facility] for continuous EEG monitoring and admission to neuro ICU.  Pertinent  Medical History   Past Medical History:  Diagnosis Date   Bipolar disorder ([place])    Depression    Schizophrenia ([place])    Seizures ([place])      Significant Hospital Events: Including procedures, antibiotic start and stop dates in addition to other pertinent events   12/22: admitted to [facility] w/ status epilepticus and intubated; transferring to [facility] neuro icu for EEG monitoring 12/23: extubated  Interim History / Subjective:   No seizures since arriving to [facility] yesterday Weaning well on SBT Following commands; good cough reflex; minimal secretions EEG in place   Objective   Blood pressure (!) 154/107, pulse 70, temperature 97.7 F (36.5 C), resp. rate (!) 21, height 5\' 11"  (1.803 m), SpO2 100 %.    Vent Mode: PRVC FiO2 (%):  [24 %-100 %] 40 % Set Rate:  [18 bmp] 18 bmp Vt Set:  [450 mL-560 mL] 560 mL PEEP:  [5 cmH20] 5 cmH20 Plateau Pressure:  [9 cmH20-15 cmH20] 15 cmH20   Intake/Output Summary (Last 24 hours) at 04/27/2021 0177 Last data filed at 04/27/2021 0600 Gross per 24 hour  Intake 419.45 ml  Output 840 ml  Net -420.55 ml   There were no vitals filed for this visit.   Examination: General:   critically ill appearing on mech vent HEENT: MM pink/moist; ETT in place Neuro: MAE; PERRL; cough/gag reflex in tact CV: s1s2, no m/r/g PULM:  dim clear BS bilaterally; on mech vent PRVC GI: soft, bsx4 active  Extremities: warm/dry, no edema  Skin: no rashes or lesions    Resolved Hospital Problem list   Lactic/AG metabolic acidosis  Assessment & Plan:  Status epilepticus: CT head 12/22: no intracranial abnormality Acute encephalopathy Hx of polysubstance abuse and tobacco abuse: UDS positive THC Hx of seizures: on keppra and lamictal at home P: -neuro following -continue EEG monitoring; no seizures appreciated on eeg -continue Keppra -prn versed for seizures -frequent neuro checks -seizure precautions in place -avoid fevers  Acute respiratory failure due to above: intubated and trached at  last admission back in 01/2021; has since been decannulated Possible hx of asthma/COPD: takes pulmicort, brovana, yupelri at home P: -doing well on SBT this am -will extubate to [place] -wean oxygen for sats >92% -pulmonary toiletry: IS -PT/OT -scheduled pulmicort, brovana, yupelri nebs -prn albuterol for wheezing  Hyper/hypo glycemia: stable P: -cbg monitoring  Leukocytosis: improving; likely  seizure related P: -trend fever/wbc curve  Hx of bipolar, depression, schizophrenia P: -consider restarting home meds abilify, benztropine, and olanzapine -will hold off on remeron and ativan due to sedating effects  HTN P: -started on home amlodipine and metoprolol -prn labetalol  GERD P: -PPI  Best Practice (right click and "Reselect all SmartList Selections" daily)   Diet/type: NPO w/ meds via tube DVT prophylaxis: LMWH GI prophylaxis: PPI Lines: N/A Foley:  N/A Code Status:  full code Last date of multidisciplinary goals of care discussion [12/22 spoke with sister over phone and updated]  Labs   CBC: Recent Labs  Lab 04/26/21 0827 04/26/21 1453 04/26/21 1658 04/27/21 0507  WBC 13.0*  --  15.3* 12.3*  NEUTROABS 7.2  --   --   --   HGB 13.4 12.9* 12.3* 12.7*  HCT 45.2 38.0* 38.3* 38.4*  MCV 94.8  --  89.7 88.1  PLT 281  --  189 202     Basic Metabolic Panel: Recent Labs  Lab 04/26/21 0827 04/26/21 1453 04/26/21 1658 04/27/21 0507  NA 138 138  --  136  K 3.5 3.7  --  4.3  CL 108  --   --  106  CO2 11*  --   --  23  GLUCOSE 172*  --   --  103*  BUN 13  --   --  11  CREATININE 0.93  --  0.73 0.76  CALCIUM 9.4  --   --  9.2  MG 2.0  --   --   --     GFR: Estimated Creatinine Clearance: 122.9 mL/min (by C-G formula based on SCr of 0.76 mg/dL). Recent Labs  Lab 04/26/21 0827 04/26/21 1002 04/26/21 1658 04/26/21 1910 04/27/21 0507  WBC 13.0*  --  15.3*  --  12.3*  LATICACIDVEN  --  6.0* 3.3* 1.1  --      Liver Function Tests: Recent Labs  Lab 04/26/21 0827  AST 36  ALT 19  ALKPHOS 78  BILITOT 0.4  PROT 7.9  ALBUMIN 4.2    No results for input(s): LIPASE, AMYLASE in the last 168 hours. Recent Labs  Lab 04/26/21 1658  AMMONIA 55*    ABG    Component Value Date/Time   PHART 7.354 04/26/2021 1453   PCO2ART 45.0 04/26/2021 1453   PO2ART 97 04/26/2021 1453   HCO3 25.1 04/26/2021 1453   TCO2 27 04/26/2021 1453   ACIDBASEDEF  1.0 04/26/2021 1453   O2SAT 97.0 04/26/2021 1453      Coagulation Profile: No results for input(s): INR, PROTIME in the last 168 hours.  Cardiac Enzymes: No results for input(s): CKTOTAL, CKMB, CKMBINDEX, TROPONINI in the last 168 hours.  HbA1C: Hgb A1c MFr Bld  Date/Time Value Ref Range Status  04/26/2021 04:58 PM 4.8 4.8 - 5.6 % Final    Comment:    (NOTE) Pre diabetes:          5.7%-6.4%  Diabetes:              >6.4%  Glycemic control for   <7.0% adults with diabetes   11/20/2019 12:50 AM 5.8 (H) 4.8 -  5.6 % Final    Comment:    (NOTE) Pre diabetes:          5.7%-6.4%  Diabetes:              >6.4%  Glycemic control for   <7.0% adults with diabetes     CBG: Recent Labs  Lab 04/26/21 1645 04/26/21 1913 04/26/21 2308 04/27/21 0315 04/27/21 0731  GLUCAP 162* 118* 109* 119* 110*    Review of Systems:   Unable to obtain; patient intubated/sedated  Past Medical History:  He,  has a past medical history of Bipolar disorder ([place]), Depression, Schizophrenia ([place]), and Seizures ([place]).   Surgical History:   Past Surgical History:  Procedure Laterality Date   FINGER SURGERY     IR GASTROSTOMY TUBE MOD SED  01/16/2021   LEFT HEART CATH AND CORONARY ANGIOGRAPHY N/A 12/25/2020   Procedure: LEFT HEART CATH AND CORONARY ANGIOGRAPHY;  Surgeon: Nelva Bush, MD;  Location: [place] [facility];  Service: Cardiovascular;  Laterality: N/A;     Social History:   reports that he has been smoking. He has never used smokeless tobacco. He reports current drug use. Drugs: Marijuana and Cocaine. He reports that he does not drink alcohol.   Family History:  His family history is not on file.   Allergies Allergies  Allergen Reactions   Lacosamide     EOSINOPHILIA     Home Medications  Prior to Admission medications   Medication Sig Start Date End Date Taking? Authorizing Provider  lamoTRIgine (LAMICTAL) 25 MG tablet Wk1:1tab night wk2:1tab 2xday wk3:1tab AM 2tabsPM  wk4:2tabs 2xday wk5:2tabs AM 3tabs PM wk6:3tabs 2xday wk7:3tabs AM 4tabs PM, wk8:4tab2xday 04/23/21  Yes [provider]  acetaminophen (TYLENOL) 160 MG/5ML solution Place 20.3 mLs (650 mg total) into feeding tube every 4 (four) hours as needed for mild pain (temp > 101.5). 01/16/21   Lestine Mount, PA-C  albuterol (PROVENTIL) (2.5 MG/3ML) 0.083% nebulizer solution Take 3 mLs (2.5 mg total) by nebulization every 4 (four) hours as needed for wheezing or shortness of breath. 01/16/21   Lestine Mount, PA-C  amLODipine (NORVASC) 10 MG tablet Place 1 tablet (10 mg total) into feeding tube daily. Patient not taking: Reported on 04/26/2021 01/17/21   Lestine Mount, PA-C  arformoterol [facility]) 15 MCG/2ML NEBU Take 2 mLs (15 mcg total) by nebulization 2 (two) times daily. Patient not taking: Reported on 04/26/2021 01/16/21   Lestine Mount, PA-C  ARIPiprazole (ABILIFY) 10 MG tablet Place 1 tablet (10 mg total) into feeding tube daily. Patient not taking: Reported on 04/26/2021 01/17/21   Lestine Mount, PA-C  ARIPiprazole (ABILIFY) 5 MG tablet Take 2 tablets by G-TUBE daily Patient not taking: Reported on 04/26/2021 03/17/21     ARIPiprazole ER (ABILIFY MAINTENA) 400 MG PRSY prefilled syringe Inject 400 mg into the muscle every 30 (thirty) days. Resume after discharge from hospital. Do not take concurrently with PO/VT Abilify. 01/16/21   Lestine Mount, PA-C  benztropine (COGENTIN) 0.5 MG tablet Take 1 tablet (0.5 mg total) by mouth 2 (two) times daily. Resume on discharge from hospital. 01/16/21   Lestine Mount, PA-C  benztropine (COGENTIN) 1 MG tablet Take 1/2 tablet by G-TUBE twice daily. Patient not taking: Reported on 04/26/2021 03/17/21     budesonide (PULMICORT) 0.25 MG/2ML nebulizer solution Take 2 mLs (0.25 mg total) by nebulization 2 (two) times daily. Patient not taking: Reported on 04/26/2021 01/16/21   Lestine Mount, PA-C  clonazePAM [place])  0.5 MG  tablet Take 2 tablets by G-TUBE every 8 hours as needed for anxiety. 03/17/21     clonazePAM (KLONOPIN) 1 MG disintegrating tablet Place 1 tablet (1 mg total) into feeding tube every 8 (eight) hours. Patient not taking: Reported on 04/26/2021 01/16/21   Lestine Mount, PA-C  enoxaparin (LOVENOX) 40 MG/0.4ML injection Inject 0.4 mLs (40 mg total) into the skin daily. Patient not taking: Reported on 04/26/2021 01/17/21   Lestine Mount, PA-C  guaiFENesin (CHEST CONGESTION RELIEF) 100 MG/5ML liquid Give 10 mls via g-tube every 4 hours as directed 03/18/21     guaiFENesin-dextromethorphan (ROBITUSSIN DM) 100-10 MG/5ML syrup Place 5 mLs into feeding tube every 4 (four) hours as needed for cough. 01/16/21   Lestine Mount, PA-C  haloperidol (HALDOL) 1 MG tablet Take 5 tablets by G-TUBE 3 times daily. 03/17/21     lansoprazole (PREVACID SOLUTAB) 30 MG disintegrating tablet Take 1 tablet by G-TUBE daily 03/17/21     levETIRAcetam (KEPPRA) 100 MG/ML solution Place 15 mLs (1,500 mg total) into feeding tube every 12 (twelve) hours. 01/16/21   Lestine Mount, PA-C  levETIRAcetam (KEPPRA) 1000 MG tablet Take 1,500 mg by mouth daily. 04/25/21   [provider]  LORazepam (ATIVAN) 2 MG/ML injection Inject 0.5 mLs (1 mg total) into the vein every 6 (six) hours as needed for anxiety. Patient not taking: Reported on 04/26/2021 01/16/21   Lestine Mount, PA-C  metoprolol tartrate (LOPRESSOR) 100 MG tablet Place 1 tablet (100 mg total) into feeding tube 2 (two) times daily. Patient not taking: Reported on 04/26/2021 01/16/21   Lestine Mount, PA-C  mirtazapine (REMERON) 15 MG tablet Take 1/2 tablet via G-TUBE nightly at bedtime 03/17/21     Nutritional Supplements (FEEDING SUPPLEMENT, PROSOURCE TF,) liquid Place 45 mLs into feeding tube 3 (three) times daily. 01/16/21   Lestine Mount, PA-C  Nutritional Supplements (FEEDING SUPPLEMENT, VITAL 1.5 CAL,) LIQD Place 1,000 mLs into feeding tube  continuous. 01/16/21   Lestine Mount, PA-C  OLANZapine (ZYPREXA) 5 MG tablet Take 1 tablet by G-TUBE daily 03/17/21     oxyCODONE (OXY IR/ROXICODONE) 5 MG immediate release tablet Place 1 tablet (5 mg total) into feeding tube every 4 (four) hours as needed for moderate pain or severe pain. Patient not taking: Reported on 04/26/2021 01/16/21   Lestine Mount, PA-C  scopolamine (TRANSDERM-SCOP) 1 MG/3DAYS Place 1 film to hairless areas behind one ear every 3 days for 14 days.  Remove old patch, fold & discard away from children & pets. 03/17/21        Critical care time: 35 minutes     JD Rexene Agent [facility] 04/27/2021, 8:12 AM  Please see [email or web address] for pager details.  From 7A-7P if no response, please call [phone]. After hours, please call ELink [phone].

## 2021-04-27 NOTE — Progress Notes (Signed)
Wasted 228mLs of Fentanyl with RN Trudee Grip in [phone].

## 2021-04-27 NOTE — Progress Notes (Signed)
Pt placed on PS/CPAP 5/5 on 40% and is tolerating well. RT will continue to monitor.

## 2021-04-27 NOTE — Progress Notes (Signed)
°  Transition of Care [facility]) Screening Note   Patient Details  Name: Luis Hunter Date of Birth: 1975/12/10   Transition of [facility]) CM/SW Contact:    Ella Bodo, RN Phone Number: 04/27/2021, 2:58 PM    Transition of Care Department [name]) has reviewed patient and no TOC needs have been identified at this time. We will continue to monitor patient advancement through interdisciplinary progression rounds. If new patient transition needs arise, please place a TOC consult.   Reinaldo Raddle, RN, BSN  Trauma/Neuro ICU Case Manager [phone]

## 2021-04-27 NOTE — Progress Notes (Signed)
D/C'd per Dr. Hortense Ramal. [facility] notified. No skin break down noted.

## 2021-04-27 NOTE — Progress Notes (Signed)
Assisted tele visit to patient with family member.  Trice Aspinall D Doyal Saric, RN   

## 2021-04-27 NOTE — Progress Notes (Signed)
Assisted tele visit to patient with family member.  Luis Hunter D Luis Youngberg, RN   

## 2021-04-28 DIAGNOSIS — E872 Acidosis, unspecified: Secondary | ICD-10-CM

## 2021-04-28 DIAGNOSIS — E8729 Other acidosis: Secondary | ICD-10-CM

## 2021-04-28 DIAGNOSIS — F209 Schizophrenia, unspecified: Secondary | ICD-10-CM

## 2021-04-28 DIAGNOSIS — G40901 Epilepsy, unspecified, not intractable, with status epilepticus: Principal | ICD-10-CM

## 2021-04-28 DIAGNOSIS — F319 Bipolar disorder, unspecified: Secondary | ICD-10-CM

## 2021-04-28 DIAGNOSIS — Z9189 Other specified personal risk factors, not elsewhere classified: Secondary | ICD-10-CM

## 2021-04-28 LAB — BASIC METABOLIC PANEL
Anion gap: 7 (ref 5–15)
BUN: 8 mg/dL (ref 6–20)
CO2: 26 mmol/L (ref 22–32)
Calcium: 8.8 mg/dL — ABNORMAL LOW (ref 8.9–10.3)
Chloride: 103 mmol/L (ref 98–111)
Creatinine, Ser: 0.82 mg/dL (ref 0.61–1.24)
GFR, Estimated: >60 mL/min (ref >60)
Glucose, Bld: 99 mg/dL (ref 70–99)
Potassium: 3.7 mmol/L (ref 3.5–5.1)
Sodium: 136 mmol/L (ref 135–145)

## 2021-04-28 LAB — CBC
HCT: 35.5 % — ABNORMAL LOW (ref 39.0–52.0)
Hemoglobin: 11.6 g/dL — ABNORMAL LOW (ref 13.0–17.0)
MCH: 29.1 pg (ref 26.0–34.0)
MCHC: 32.7 g/dL (ref 30.0–36.0)
MCV: 89 fL (ref 80.0–100.0)
Platelets: 179 10*3/uL (ref 150–400)
RBC: 3.99 MIL/uL — ABNORMAL LOW (ref 4.22–5.81)
RDW: 15.5 % (ref 11.5–15.5)
WBC: 7.9 10*3/uL (ref 4.0–10.5)
nRBC: 0 % (ref 0.0–0.2)

## 2021-04-28 LAB — GLUCOSE, CAPILLARY
Glucose-Capillary: 91 mg/dL (ref 70–99)
Glucose-Capillary: 128 mg/dL — ABNORMAL HIGH (ref 70–99)

## 2021-04-28 MED ORDER — LEVETIRACETAM 100 MG/ML PO SOLN: 1000.0000 mg | Freq: Two times a day (BID) | ORAL | 12 refills | Status: DC

## 2021-04-28 NOTE — Discharge Summary (Signed)
Physician Discharge Summary  Luis Hunter:[id number] DOB: 27-Oct-1975 DOA: 04/26/2021  PCP: [facility] [place] date: 04/26/2021 Discharge date: 04/28/2021 Admitted From: Home Disposition: Home Recommendations for Outpatient Follow-up:  Outpatient follow-up with psychiatry and neurology as soon as possible Please obtain CBC/BMP/Mag at follow up Please follow up on the following pending results: Lamictal level Home Health: PT/OT/RN/SLP Equipment/Devices: None required Discharge Condition: Stable CODE STATUS: Full code  Follow-up [place], [facility]. Schedule an appointment as soon as possible for a visit in 1 week(s).   Specialty: General Practice Contact information: [place] [address] [phone]                Hospital Course: 45 year old M with PMH of depression, ABD, schizophrenia, seizure disorder, prior polysubstance use and asthma . On 04/26/2021, patient's girlfriend called EMS due to seizure and AMS.  Upon EMS arrival, patient was postictal and was noted to have multiple episodes of seizure-like activity.  Patient required 4 mg of IV Versed.  Seizures appeared generalized tonic clonic activity with eye deviation to the left.  Patient [facility] to [facility] ED.  Patient had agonal respirations and required intubation for airway protection.  He was tachycardic but normotensive.  Afebrile.  CBG 171.   CMP significant for anion gap metabolic acidosis.  CBC with mild leukocytosis to 13 without left shift.  Ethanol, acetaminophen and salicylate level negative. Lactic acid 6.0.  Keppra level therapeutic at 25.  Lamotrigine level pending. CT head negative. UDS positive for THC.  CXR with bilateral small atelectasis. Started on propofol for sedation after intubation.  Given Keppra.   PCCM consulted for transfer to [facility] for continuous EEG monitoring and admission to neuro ICU.  Patient was  evaluated by neurology.  Continuous EEG without seizure or epileptiform discharge.  He was extubated to nasal cannula on 04/27/2021.  Eventually weaned to room air and remained stable.  He was cleared for discharge by neurology on home Keppra and Lamictal titration per his outpatient neurologist.  Patient to follow-up with his neurologist in 1 to 2 weeks or sooner if needed.  Multiple antipsychotic medications on patient's medication list.  However, he is significant other stated that he only takes Abilify injection and Klonopin as needed.  Strongly encouraged to follow-up with his psychiatrist as soon as possible.  See individual problem list below for more on hospital course.  Discharge Diagnoses:  Status epilepticus in patient with history of seizure disorder-resolved.  Keppra level within normal. -Discharged on home Keppra 1000 mg twice daily and Lamictal titration per his neurologist -Follow Lamictal level. -Outpatient follow-up with his neurologist -Seizure precaution as below  Acute metabolic encephalopathy in the setting of status epilepticus: Resolved.  Acute hypoxic respiratory failure ruled out.  Patient was intubated for airway protection in the setting of status epilepticus  History of anxiety/depression/bipolar disorder/schizophrenia -On Abilify injection and Klonopin as needed  Lactic acidosis/anion gap metabolic acidosis-likely due to seizure.  Resolved.  History of tracheostomy s/p decannulation Possible history of asthma -Continue nebulizers  History of dysphagia: Seems to have resolved.  Per significant other, on regular diet. -Ordered outpatient SLP to ensure  Physical deconditioning/generalized weakness -PT/OT  At risk for polypharmacy -Ordered HH RN  Leukocytosis-likely demargination.  Resolved.  Marijuana use -Encouraged to stop.  Body mass index is 23.65 kg/m.           Discharge Exam: Vitals:   04/28/21 0600 04/28/21  0700 04/28/21 0800  04/28/21 0824  BP: 113/89 123/83 110/62 110/62  Pulse: 64 (!) 54 69   Temp: 98.1 F (36.7 C)  97.9 F (36.6 C)   Resp: (!) 21 19 (!) 21   Height:      Weight:      SpO2: 97% 95% 97%   TempSrc:   Axillary   BMI (Calculated):         GENERAL: No apparent distress.  Nontoxic. HEENT: MMM.  Vision and hearing grossly intact.  NECK: Supple.  No apparent JVD.  RESP: 97% on RA.  No IWOB.  Fair aeration bilaterally. CVS:  RRR. Heart sounds normal.  ABD/GI/GU: Bowel sounds present. Soft. Non tender.  MSK/EXT:  Moves extremities. No apparent deformity. No edema.  SKIN: no apparent skin lesion or wound NEURO: Awake and alert.  Oriented appropriately.  No apparent focal neuro deficit. PSYCH: Calm. Normal affect.   Discharge Instructions  Discharge Instructions     Diet - low sodium heart healthy   Complete by: As directed    Discharge instructions   Complete by: As directed    It has been a pleasure taking care of you!  You were hospitalized due to seizure.  You may continue taking your seizure medication as previously recommended by your neurologist.  Follow-up with your neurologist in 1 to 2 weeks or sooner if needed.  Please review your other medications with your prescribers.  - DO not drive for 6 months/until cleared by physician   Per [facility] statutes, patients with seizures are not allowed to drive until they have been seizure-free for six months. Patient acknowledged,repeated, and understands.    Use caution when using heavy equipment or power tools. Avoid working on ladders or at heights. Take showers instead of baths. Ensure the water temperature is not too high on the home water heater. Do not go swimming alone. Do not lock yourself in a room alone (i.e. bathroom). When caring for infants or small children, sit down when holding, feeding, or changing them to minimize risk of injury to the child in the event you have a seizure. Maintain good sleep hygiene. Avoid  alcohol.    If patient has another seizure, call 911 and bring them back to the ED if: A.  Any seizure-like activity or alteration of mentation or LOC - especially if  > 5 mins.      B.  The patient doesn't wake shortly after the seizure or has new problems such as difficulty seeing, speaking or moving following the seizure C.  The patient was injured during the seizure D.  The patient has a temperature over 102 F (39C) E.  The patient vomited during the seizure and now is having trouble breathing   During the Seizure   - First, ensure adequate ventilation and place patients on the floor on their left side  Loosen clothing around the neck and ensure the airway is patent. If the patient is clenching the teeth, do not force the mouth open with any object as this can cause severe damage - Remove all items from the surrounding that can be hazardous. The patient may be oblivious to what's happening and may not even know what he or she is doing. If the patient is confused and wandering, either gently guide him/her away and block access to outside areas - Reassure the individual and be comforting - Call 911. In most cases, the seizure ends before EMS arrives. However, there are cases when  seizures may last over 3 to 5 minutes. Or the individual may have developed breathing difficulties or severe injuries. If a pregnant patient or a person with diabetes develops a seizure, it is prudent to call an ambulance. - Finally, if the patient does not regain full consciousness, then call EMS. Most patients will remain confused for about 45 to 90 minutes after a seizure, so you must use judgment in calling for help. - Avoid restraints but make sure the patient is in a bed with padded side rails - Place the individual in a lateral position with the neck slightly flexed; this will help the saliva drain from the mouth and prevent the tongue from falling backward - Remove all nearby furniture and other hazards from  the area - Provide verbal assurance as the individual is regaining consciousness - Provide the patient with privacy if possible - Call for help and start treatment as ordered by the caregiver    After the Seizure (Postictal Stage)   After a seizure, most patients experience confusion, fatigue, muscle pain and/or a headache. Thus, one should permit the individual to sleep. For the next few days, reassurance is essential. Being calm and helping reorient the person is also of importance.   Most seizures are painless and end spontaneously. Seizures are not harmful to others but can lead to complications such as stress on the lungs, brain and the heart. Individuals with prior lung problems may develop labored breathing and respiratory distress.      Take care,   Increase activity slowly   Complete by: As directed       Allergies as of 04/28/2021       Reactions   Lacosamide    EOSINOPHILIA   Valproic Acid And Related    hyperammonemia        Medication List     STOP taking these medications    enoxaparin 40 MG/0.4ML injection Commonly known as: LOVENOX   feeding supplement (PROSource TF) liquid   feeding supplement (VITAL 1.5 CAL) Liqd   guaiFENesin 100 MG/5ML liquid Commonly known as: Chest Congestion Relief   guaiFENesin-dextromethorphan 100-10 MG/5ML syrup Commonly known as: ROBITUSSIN DM   haloperidol 1 MG tablet Commonly known as: HALDOL   LORazepam 2 MG/ML injection Commonly known as: ATIVAN   OLANZapine 5 MG tablet Commonly known as: ZYPREXA   oxyCODONE 5 MG immediate release tablet Commonly known as: Oxy IR/ROXICODONE       TAKE these medications    acetaminophen 160 MG/5ML solution Commonly known as: TYLENOL Place 20.3 mLs (650 mg total) into feeding tube every 4 (four) hours as needed for mild pain (temp > 101.5).   albuterol (2.5 MG/3ML) 0.083% nebulizer solution Commonly known as: PROVENTIL Take 3 mLs (2.5 mg total) by nebulization every 4  (four) hours as needed for wheezing or shortness of breath.   amLODipine 10 MG tablet Commonly known as: NORVASC Place 1 tablet (10 mg total) into feeding tube daily.   arformoterol 15 MCG/2ML Nebu Commonly known as: BROVANA Take 2 mLs (15 mcg total) by nebulization 2 (two) times daily.   ARIPiprazole ER 400 MG Prsy prefilled syringe Commonly known as: ABILIFY MAINTENA Inject 400 mg into the muscle every 30 (thirty) days. Resume after discharge from hospital. Do not take concurrently with PO/VT Abilify. What changed: Another medication with the same name was removed. Continue taking this medication, and follow the directions you see here.   benztropine 0.5 MG tablet Commonly known as: COGENTIN Take 1 tablet (  0.5 mg total) by mouth 2 (two) times daily. Resume on discharge from hospital. What changed:  when to take this reasons to take this Another medication with the same name was removed. Continue taking this medication, and follow the directions you see here.   budesonide 0.25 MG/2ML nebulizer solution Commonly known as: PULMICORT Take 2 mLs (0.25 mg total) by nebulization 2 (two) times daily.   clonazePAM 0.5 MG tablet Commonly known as: KLONOPIN Take 2 tablets by G-TUBE every 8 hours as needed for anxiety. What changed: Another medication with the same name was removed. Continue taking this medication, and follow the directions you see here.   lamoTRIgine 25 MG tablet Commonly known as: LAMICTAL Wk1:1tab night wk2:1tab 2xday wk3:1tab AM 2tabsPM wk4:2tabs 2xday wk5:2tabs AM 3tabs PM wk6:3tabs 2xday wk7:3tabs AM 4tabs PM, wk8:4tab2xday   lansoprazole 30 MG disintegrating tablet Commonly known as: PREVACID SOLUTAB Take 1 tablet by G-TUBE daily   levETIRAcetam 100 MG/ML solution Commonly known as: KEPPRA Place 10 mLs (1,000 mg total) into feeding tube every 12 (twelve) hours. What changed:  how much to take Another medication with the same name was removed. Continue taking  this medication, and follow the directions you see here.   metoprolol tartrate 100 MG tablet Commonly known as: LOPRESSOR Place 1 tablet (100 mg total) into feeding tube 2 (two) times daily.   mirtazapine 15 MG tablet Commonly known as: REMERON Take 1/2 tablet via G-TUBE nightly at bedtime   scopolamine 1 MG/3DAYS Commonly known as: TRANSDERM-SCOP Place 1 film to hairless areas behind one ear every 3 days for 14 days.  Remove old patch, fold & discard away from children & pets. What changed:  how much to take when to take this        Consultations: Neurology Pulmonology admitted patient  Procedures/Studies: Intubation and mechanical ventilation from 12/22 through 12/23   CT HEAD WO CONTRAST (5MM)  Result Date: 04/26/2021 CLINICAL DATA:  status epilepticus EXAM: CT HEAD WITHOUT CONTRAST TECHNIQUE: Contiguous axial images were obtained from the base of the skull through the vertex without intravenous contrast. COMPARISON:  01/23/2021 FINDINGS: Brain: No acute intracranial abnormality. Specifically, no hemorrhage, hydrocephalus, mass lesion, acute infarction, or significant intracranial injury. Vascular: No hyperdense vessel or unexpected calcification. Skull: No acute calvarial abnormality. Sinuses/Orbits: No acute findings Other: None IMPRESSION: No acute intracranial abnormality. Electronically Signed   By: Rolm Baptise M.D.   On: 04/26/2021 09:30   DG Chest Port 1 View  Result Date: 04/27/2021 CLINICAL DATA:  Acute respiratory failure, endotracheal tube present EXAM: PORTABLE CHEST 1 VIEW COMPARISON:  04/26/2021 FINDINGS: Endotracheal tube and enteric tube are again identified. Lungs remain clear. No pneumothorax or pleural effusion. Normal heart size. IMPRESSION: Stable lines and tubes.  No new abnormality. Electronically Signed   By: Macy Mis M.D.   On: 04/27/2021 08:30   DG Chest Portable 1 View  Result Date: 04/26/2021 CLINICAL DATA:  Post intubation in a  45 year old male. EXAM: PORTABLE CHEST 1 VIEW COMPARISON:  Comparison is made with exam from February 13, 2021. FINDINGS: Endotracheal tube approximately 6 cm from the carina, tip just below the clavicular heads. Gastric tube coiled in the stomach. EKG leads project over the chest. Cardiomediastinal contours and hilar structures are normal. Minimal basilar opacities. No lobar consolidation. No sign of effusion on frontal radiograph or visible pneumothorax. On limited assessment there is no acute skeletal process. IMPRESSION: Endotracheal tube approximately 6 cm from the carina, tip just below the clavicular heads in the  proximal to mid trachea. Minimal basilar opacities, may reflect atelectasis. No lobar consolidation. Electronically Signed   By: Zetta Bills M.D.   On: 04/26/2021 09:13   EEG adult  Result Date: 04/26/2021 Lora Havens, MD     04/26/2021  3:01 PM Patient Name: Luis Hunter MRN: [id number] Epilepsy Attending: Lora Havens Referring Physician/Provider: Mick Sell, PA-C Date: 04/26/2021 Duration: 22.54 mins Patient history: 45 year old male with history of epilepsy who presented with breakthrough seizures and status epilepticus.  EEG to evaluate for seizure. Level of alertness: comatose AEDs during EEG study: Keppra, propofol Technical aspects: This EEG study was done with scalp electrodes positioned according to the 10-20 International system of electrode placement. Electrical activity was acquired at a sampling rate of 500Hz  and reviewed with a high frequency filter of 70Hz  and a low frequency filter of 1Hz . EEG data were recorded continuously and digitally stored. Description: EEG showed continuous generalized and maximal right frontotemporal region 3 to 6 Hz theta-delta slowing admixed with 15 to 18 Hz generalized beta activity.  Hyperventilation and photic stimulation were not performed.   ABNORMALITY - Continuous slow, generalized and maximal right frontotemporal region  IMPRESSION: This study is suggestive of cortical dysfunction in right frontotemporal region, nonspecific etiology but could be secondary to underlying structural abnormality, postictal state.  There is also evidence of moderate to severe diffuse encephalopathy, nonspecific etiology but most likely secondary to sedation.  No seizures or epileptiform discharges were seen throughout the recording. Priyanka Barbra Sarks   Overnight EEG with video  Result Date: 04/27/2021 Lora Havens, MD     04/27/2021 11:10 AM Patient Name: Luis Hunter MRN: [id number] Epilepsy Attending: Lora Havens Referring Physician/Provider: Dr Zeb Comfort Duration: 04/26/2021 1453 to 04/27/2021 1011  Patient history: 45 year old male with history of epilepsy who presented with breakthrough seizures and status epilepticus.  EEG to evaluate for seizure.  Level of alertness: lethargic  AEDs during EEG study: Keppra  Technical aspects: This EEG study was done with scalp electrodes positioned according to the 10-20 International system of electrode placement. Electrical activity was acquired at a sampling rate of 500Hz  and reviewed with a high frequency filter of 70Hz  and a low frequency filter of 1Hz . EEG data were recorded continuously and digitally stored.  Description: EEG showed continuous generalized 6 to 10 Hz theta-alpha activity admixed with intermittent generalized 2 to 3 Hz delta slowing. Hyperventilation and photic stimulation were not performed.    ABNORMALITY - Continuous slow, generalized   IMPRESSION: This study is suggestive of moderate diffuse encephalopathy diffuse encephalopathy, nonspecific etiology but most likely secondary to sedation.  No seizures or epileptiform discharges were seen throughout the recording.  Lora Havens       The results of significant diagnostics from this hospitalization (including imaging, microbiology, ancillary and laboratory) are listed below for reference.      Microbiology: Recent Results (from the past 240 hour(s))  Resp Panel by RT-PCR (Flu A&B, Covid) Nasopharyngeal Swab     Status: None   Collection Time: 04/26/21  8:27 AM   Specimen: Nasopharyngeal Swab; Nasopharyngeal(NP) swabs in vial transport medium  Result Value Ref Range Status   SARS Coronavirus 2 by RT PCR NEGATIVE NEGATIVE Final    Comment: (NOTE) SARS-CoV-2 target nucleic acids are NOT DETECTED.  The SARS-CoV-2 RNA is generally detectable in upper respiratory specimens during the acute phase of infection. The lowest concentration of SARS-CoV-2 viral copies this assay can detect is 138 copies/mL. A negative  result does not preclude SARS-Cov-2 infection and should not be used as the sole basis for treatment or other patient management decisions. A negative result may occur with  improper specimen collection/handling, submission of specimen other than nasopharyngeal swab, presence of viral mutation(s) within the areas targeted by this assay, and inadequate number of viral copies(<138 copies/mL). A negative result must be combined with clinical observations, patient history, and epidemiological information. The expected result is Negative.  Fact Sheet for Patients:  EntrepreneurPulse.com.au  Fact Sheet for Healthcare Providers:  IncredibleEmployment.be  This test is no t yet approved or cleared by the [place] FDA and  has been authorized for detection and/or diagnosis of SARS-CoV-2 by FDA under an Emergency Use Authorization (EUA). This EUA will remain  in effect (meaning this test can be used) for the duration of the COVID-19 declaration under Section 564(b)(1) of the Act, 21 U.S.C.section 360bbb-3(b)(1), unless the authorization is terminated  or revoked sooner.       Influenza A by PCR NEGATIVE NEGATIVE Final   Influenza B by PCR NEGATIVE NEGATIVE Final    Comment: (NOTE) The Xpert Xpress SARS-CoV-2/FLU/RSV plus assay is  intended as an aid in the diagnosis of influenza from Nasopharyngeal swab specimens and should not be used as a sole basis for treatment. Nasal washings and aspirates are unacceptable for Xpert Xpress SARS-CoV-2/FLU/RSV testing.  Fact Sheet for Patients: EntrepreneurPulse.com.au  Fact Sheet for Healthcare Providers: IncredibleEmployment.be  This test is not yet approved or cleared by the [place] FDA and has been authorized for detection and/or diagnosis of SARS-CoV-2 by FDA under an Emergency Use Authorization (EUA). This EUA will remain in effect (meaning this test can be used) for the duration of the COVID-19 declaration under Section 564(b)(1) of the Act, 21 U.S.C. section 360bbb-3(b)(1), unless the authorization is terminated or revoked.  Performed at [facility], [address] [place], [place] [address]   MRSA Next Gen by PCR, Nasal     Status: None   Collection Time: 04/26/21  1:44 PM   Specimen: Nasal Mucosa; Nasal Swab  Result Value Ref Range Status   MRSA by PCR Next Gen NOT DETECTED NOT DETECTED Final    Comment: (NOTE) The GeneXpert MRSA Assay (FDA approved for NASAL specimens only), is one component of a comprehensive MRSA colonization surveillance program. It is not intended to diagnose MRSA infection nor to guide or monitor treatment for MRSA infections. Test performance is not FDA approved in patients less than 61 years old. Performed at [facility], [place] [address] [place],  [address]      Labs:  CBC: Recent Labs  Lab 04/26/21 0827 04/26/21 1453 04/26/21 1658 04/27/21 0507 04/28/21 0409  WBC 13.0*  --  15.3* 12.3* 7.9  NEUTROABS 7.2  --   --   --   --   HGB 13.4 12.9* 12.3* 12.7* 11.6*  HCT 45.2 38.0* 38.3* 38.4* 35.5*  MCV 94.8  --  89.7 88.1 89.0  PLT 281  --  189 202 179   BMP &GFR Recent Labs  Lab 04/26/21 0827 04/26/21 1453 04/26/21 1658 04/27/21 0507 04/28/21 0409   NA 138 138  --  136 136  K 3.5 3.7  --  4.3 3.7  CL 108  --   --  106 103  CO2 11*  --   --  23 26  GLUCOSE 172*  --   --  103* 99  BUN 13  --   --  11  8  CREATININE 0.93  --  0.73 0.76 0.82  CALCIUM 9.4  --   --  9.2 8.8*  MG 2.0  --   --   --   --    Estimated Creatinine Clearance: 119.9 mL/min (by C-G formula based on SCr of 0.82 mg/dL). Liver & Pancreas: Recent Labs  Lab 04/26/21 0827  AST 36  ALT 19  ALKPHOS 78  BILITOT 0.4  PROT 7.9  ALBUMIN 4.2   No results for input(s): LIPASE, AMYLASE in the last 168 hours. Recent Labs  Lab 04/26/21 1658  AMMONIA 55*   Diabetic: Recent Labs    04/26/21 1658  HGBA1C 4.8   Recent Labs  Lab 04/27/21 1510 04/27/21 1919 04/27/21 2322 04/28/21 0308 04/28/21 0743  GLUCAP 91 165* 109* 91 128*   Cardiac Enzymes: No results for input(s): CKTOTAL, CKMB, CKMBINDEX, TROPONINI in the last 168 hours. No results for input(s): PROBNP in the last 8760 hours. Coagulation Profile: No results for input(s): INR, PROTIME in the last 168 hours. Thyroid Function Tests: No results for input(s): TSH, T4TOTAL, FREET4, T3FREE, THYROIDAB in the last 72 hours. Lipid Profile: Recent Labs    04/27/21 0507  TRIG 51   Anemia Panel: No results for input(s): VITAMINB12, FOLATE, FERRITIN, TIBC, IRON, RETICCTPCT in the last 72 hours. Urine analysis:    Component Value Date/Time   COLORURINE STRAW (A) 04/26/2021 1002   APPEARANCEUR CLEAR (A) 04/26/2021 1002   APPEARANCEUR [place] 03/12/2014 1249   LABSPEC 1.025 04/26/2021 1002   LABSPEC 1.026 03/12/2014 1249   PHURINE 5.0 04/26/2021 1002   GLUCOSEU 100 (A) 04/26/2021 1002   GLUCOSEU Negative 03/12/2014 1249   HGBUR TRACE (A) 04/26/2021 1002   [place] 04/26/2021 1002   BILIRUBINUR Negative 03/12/2014 [place] 04/26/2021 1002   PROTEINUR NEGATIVE 04/26/2021 1002   NITRITE NEGATIVE 04/26/2021 1002   LEUKOCYTESUR NEGATIVE 04/26/2021 1002   LEUKOCYTESUR Negative  03/12/2014 1249   Sepsis Labs: Invalid input(s): PROCALCITONIN, LACTICIDVEN   Time coordinating discharge: 55 minutes  SIGNED:  Mercy Riding, MD  [facility] 04/28/2021, 11:49 AM

## 2021-04-28 NOTE — Progress Notes (Addendum)
Subjective: No seizures overnight.  Patient was extubated this morning.  States he was taking Keppra and lamotrigine.  ROS: negative except above  Examination  Vital signs in last 24 hours: Temp:  [97.9 F (36.6 C)-99.5 F (37.5 C)] 97.9 F (36.6 C) (12/24 0800) Pulse Rate:  [54-76] 69 (12/24 0800) Resp:  [13-22] 21 (12/24 0800) BP: (106-167)/(62-100) 110/62 (12/24 0824) SpO2:  [92 %-100 %] 97 % (12/24 0800) Weight:  [76.9 kg] 76.9 kg (12/24 0400)  General: lying in bed, NAD CVS: pulse-normal rate and rhythm RS: breathing comfortably, CTAB Extremities: normal  Neuro: MS: Alert, oriented, follows commands CN: pupils equal and reactive,  EOMI, face symmetric Motor: Antigravity strength in all 4 extremities  Basic Metabolic Panel: Recent Labs  Lab 04/26/21 0827 04/26/21 1453 04/26/21 1658 04/27/21 0507 04/28/21 0409  NA 138 138  --  136 136  K 3.5 3.7  --  4.3 3.7  CL 108  --   --  106 103  CO2 11*  --   --  23 26  GLUCOSE 172*  --   --  103* 99  BUN 13  --   --  11 8  CREATININE 0.93  --  0.73 0.76 0.82  CALCIUM 9.4  --   --  9.2 8.8*  MG 2.0  --   --   --   --      CBC: Recent Labs  Lab 04/26/21 0827 04/26/21 1453 04/26/21 1658 04/27/21 0507 04/28/21 0409  WBC 13.0*  --  15.3* 12.3* 7.9  NEUTROABS 7.2  --   --   --   --   HGB 13.4 12.9* 12.3* 12.7* 11.6*  HCT 45.2 38.0* 38.3* 38.4* 35.5*  MCV 94.8  --  89.7 88.1 89.0  PLT 281  --  189 202 179      Coagulation Studies: No results for input(s): LABPROT, INR in the last 72 hours.  Imaging No new brain imaging overnight  ASSESSMENT AND PLAN: 45 year old male with history of epilepsy who presented with breakthrough seizures and status epilepticus. No further seizures and LTM discontinued. LEV level therapeutic 24.9 and will not change.   He remains seizure free and he can continue lamotrigine titration as directed and follow up with outpatient neurology.  Status epilepticus, convulsive Epilepsy  with breakthrough seizures Cannabis use disorder Hyperammonemia -Etiology of breakthrough seizure: Suspected medication noncompliance.  lamotrigine levels pending. No evidence of infection   Recommendations:  -Continue Keppra 1000 mg twice daily.   -Lamotrigine level still pending. -Continue lamotrigine 25 mg daily - He has a lamotrigine titration schedule that he should follow closely and follow up with outpatient neurology. -Per review of Dr. Trena Platt note, plan was to transition him to lamotrigine. Adherence was not clear however, will continue plan to transition to lamotrigine and he may follow up with outpatient neurology by which time levels will be back and can adjust dose further. -Continue seizure precautions.  -Consider seizure rescue medication such buccal/sublingual or nasal (Nayzilam or Valtoco) as for clinical seizure-like activity.  -Management of rest of comorbidities per primary team -Discussed plan via secure chat with critical care team.   Thank you for allowing [place] to participate in the care of this patient. If you have any further questions, please contact  me or neurohospitalist.    I have spent a total of  25 minutes with the patient reviewing hospital notes,  test results, labs and examining the patient as well as establishing an assessment and plan  that was discussed personally with the patient.  > 50% of time was spent in direct patient care.    Lynnae Sandhoff, MD Stroke Neurology Page: [phone]

## 2021-04-28 NOTE — Evaluation (Signed)
Occupational Therapy Evaluation Patient Details Name: Luis Hunter MRN: [id number] DOB: 1975-09-18 Today's Date: 04/28/2021   History of Present Illness 45 y/o male presented to ED on 04/26/21 for witnessed seizure by girlfriend and AMS. CT head negative. EEG suggestive of moderate diffuse encephalopathy but no seizures seen. PMH: schizophrenia, cocaine abuse, bipolar disorder, seizures, hx of trach (decannulated).   Clinical Impression   PT admitted with seizure. Pt currently with functional limitiations due to the deficits listed below (see OT problem list). Pt appears to be close to baseline by patient report. Pt oriented and aware of admission. Pt reports medication not working and concerned. Pt with mild balance deficits and reports having them at baseline. Pt following 3 step commands.  Pt will benefit from skilled OT to increase their independence and safety with adls and balance to allow discharge HHOT ( balance).       Recommendations for follow up therapy are one component of a multi-disciplinary discharge planning process, led by the attending physician.  Recommendations may be updated based on patient status, additional functional criteria and insurance authorization.   Follow Up Recommendations  Home health OT    Assistance Recommended at Discharge PRN  Functional Status Assessment  Patient has had a recent decline in their functional status and demonstrates the ability to make significant improvements in function in a reasonable and predictable amount of time.  Equipment Recommendations  None recommended by OT    Recommendations for Other Services       Precautions / Restrictions Precautions Precautions: Fall Precaution Comments: seizures      Mobility Bed Mobility Overal bed mobility: Independent                  Transfers Overall transfer level: Needs assistance   Transfers: Sit to/from Stand Sit to Stand: Min guard           General transfer  comment: pt with ankle strategy initially but able to recover and self correct to static standing      Balance Overall balance assessment: Mild deficits observed, not formally tested                                         ADL either performed or assessed with clinical judgement   ADL Overall ADL's : Needs assistance/impaired Eating/Feeding: Independent   Grooming: Wash/dry face;Supervision/safety;Standing   Upper Body Bathing: Supervision/ safety   Lower Body Bathing: Min guard   Upper Body Dressing : Supervision/safety   Lower Body Dressing: Min guard   Toilet Transfer: Min guard           Functional mobility during ADLs: Minimal assistance General ADL Comments: pt noted to have some balance deficits. pt reports these are baseline. pt noted to bump door on the left side but self correct. pt demonstrates decre balanec with head turns and walking backward. pt unable to sustain vision occlusion without a sway to balance. pt advised to have fiance help with shower/ tub transfers     Vision Baseline Vision/History: 0 No visual deficits       Perception     Praxis      Pertinent Vitals/Pain Pain Assessment: No/denies pain     Hand Dominance Left   Extremity/Trunk Assessment Upper Extremity Assessment Upper Extremity Assessment: Overall WFL for tasks assessed   Lower Extremity Assessment Lower Extremity Assessment: Overall WFL for tasks assessed  Cervical / Trunk Assessment Cervical / Trunk Assessment: Normal   Communication Communication Communication: No difficulties   Cognition Arousal/Alertness: Awake/alert Behavior During Therapy: WFL for tasks assessed/performed Overall Cognitive Status: Within Functional Limits for tasks assessed                                       General Comments  VSS HR 65 Bp 118/98    Exercises     Shoulder Instructions      Home Living Family/patient expects to be discharged to::  Private residence Living Arrangements: Spouse/significant other (girlfriend) Available Help at Discharge: Available 24 hours/day Type of Home: Apartment Home Access: Stairs to enter [employer] of Steps: 3 Entrance Stairs-Rails: Can reach both Home Layout: One level     Bathroom Shower/Tub: Teacher, early years/pre: [place]: Conservation officer, nature (2 wheels);Cane - single point   Additional Comments: fiance does not work and can provide 24/7      Prior Functioning/Environment Prior Level of Function : Independent/Modified Independent             Mobility Comments: walks for transportation does not drive ADLs Comments: indep        OT Problem List: Impaired balance (sitting and/or standing)      OT Treatment/Interventions: Self-care/ADL training;DME and/or AE instruction;Energy conservation;Neuromuscular education;Patient/family education;Balance training    OT Goals(Current goals can be found in the care plan section) Acute Rehab OT Goals Patient Stated Goal: to get medication figured out OT Goal Formulation: With patient Time For Goal Achievement: 05/12/21 Potential to Achieve Goals: Good  OT Frequency: Min 2X/week   Barriers to D/C:            Co-evaluation              AM-PAC OT "6 Clicks" Daily Activity     Outcome Measure Help from another person eating meals?: None Help from another person taking care of personal grooming?: None Help from another person toileting, which includes using toliet, bedpan, or urinal?: A Little Help from another person bathing (including washing, rinsing, drying)?: A Little Help from another person to put on and taking off regular upper body clothing?: None Help from another person to put on and taking off regular lower body clothing?: A Little 6 Click Score: 21   End of Session Nurse Communication: Mobility status  Activity Tolerance: Patient tolerated treatment well Patient left:  in chair;with call bell/phone within reach;with chair alarm set  OT Visit Diagnosis: Unsteadiness on feet (R26.81)                Time: 9147-8295 OT Time Calculation (min): 8 min Charges:  OT General Charges $OT Visit: 1 Visit OT Evaluation $OT Eval Low Complexity: 1 Low   Brynn, OTR/L  Acute Rehabilitation Services Pager: [phone] Office: [phone] .   Jeri Modena 04/28/2021, 9:37 AM

## 2021-04-28 NOTE — Progress Notes (Signed)
Received referral to assist with [facility] PT, OT, ST, and RN. Met with pt. He reports that he is returning home with the support of his girl friend who lives with him. He reports that he doesn't have a preference for a [place] agency. Discussed with pt Medicare ratings for [facility] agencies. He chose [facility]. Contacted Georgina Snell with [facility] and he accepted the referral.

## 2021-04-28 NOTE — Progress Notes (Signed)
Pt d/c to home by car with family. Assessment stable. Meds reviewed thoroughly with pt and girlfriend on the phone. Pt states understanding of medications. All questions answered.

## 2021-04-28 NOTE — Evaluation (Signed)
Physical Therapy Evaluation Patient Details Name: Luis Hunter MRN: [id number] DOB: 11-29-1975 Today's Date: 04/28/2021  History of Present Illness  45 y/o male presented to ED on 04/26/21 for witnessed seizure by girlfriend and AMS. CT head negative. EEG suggestive of moderate diffuse encephalopathy but no seizures seen. PMH: schizophrenia, cocaine abuse, bipolar disorder, seizures, hx of trach (decannulated).  Clinical Impression  Patient admitted for above diagnosis. Patient currently presents with weakness and impaired balance. Patient reports being at baseline mobility wise. Patient requires up to minA for ambulation due to balance deficits. Patient scored 16/24 on DGI which indicative of increased risk for falls. Patient is at high fall risk and educated patient on need for assistance from fiance for mobility at discharge, patient verbalized understanding. Patient will benefit from skilled PT services during acute stay to address listed deficits. Recommend HHPT at discharge to maximize functional independence and address balance deficits.        Recommendations for follow up therapy are one component of a multi-disciplinary discharge planning process, led by the attending physician.  Recommendations may be updated based on patient status, additional functional criteria and insurance authorization.  Follow Up Recommendations Home health PT    Assistance Recommended at Discharge Frequent or constant Supervision/Assistance  Functional Status Assessment Patient has had a recent decline in their functional status and demonstrates the ability to make significant improvements in function in a reasonable and predictable amount of time.  Equipment Recommendations  None recommended by PT    Recommendations for Other Services       Precautions / Restrictions Precautions Precautions: Fall Precaution Comments: seizures Restrictions Weight Bearing Restrictions: No      Mobility  Bed  Mobility Overal bed mobility: Independent                  Transfers Overall transfer level: Needs assistance   Transfers: Sit to/from Stand Sit to Stand: Min guard           General transfer comment: pt with ankle strategy initially but able to recover and self correct to static standing    Ambulation/Gait Ambulation/Gait assistance: Min guard;Min assist Gait Distance (Feet): 200 Feet Assistive device: None Gait Pattern/deviations: Step-through pattern;Drifts right/left Gait velocity: decreased     General Gait Details: drifts L/R throughout ambulation. LOB with dynamic tasks requiring minA to recover. Overall min guard otherwise for straight path ambulation  Stairs Stairs: Yes Stairs assistance: Min guard Stair Management: One rail Left;Alternating pattern;Forwards Number of Stairs: 2 General stair comments: min guard for safety  Wheelchair Mobility    Modified Rankin (Stroke Patients Only)       Balance Overall balance assessment: Mild deficits observed, not formally tested                               Standardized Balance Assessment Standardized Balance Assessment : Dynamic Gait Index   Dynamic Gait Index Level Surface: Mild Impairment Change in Gait Speed: Mild Impairment Gait with Horizontal Head Turns: Mild Impairment Gait with Vertical Head Turns: Mild Impairment Gait and Pivot Turn: Mild Impairment Step Over Obstacle: Mild Impairment Step Around Obstacles: Mild Impairment Steps: Mild Impairment Total Score: 16       Pertinent Vitals/Pain Pain Assessment: No/denies pain    Home Living Family/patient expects to be discharged to:: Private residence Living Arrangements: Spouse/significant other (girlfriend) Available Help at Discharge: Available 24 hours/day Type of Home: Apartment Home Access: Stairs to enter Entrance  Stairs-Rails: Can reach both Entrance Stairs-Number of Steps: 3   Home Layout: One level Home  Equipment: Conservation officer, nature (2 wheels);Cane - single point Additional Comments: fiance does not work and can provide 24/7    Prior Function Prior Level of Function : Independent/Modified Independent             Mobility Comments: walks for transportation does not drive ADLs Comments: indep     Hand Dominance   Dominant Hand: Left    Extremity/Trunk Assessment   Upper Extremity Assessment Upper Extremity Assessment: Overall WFL for tasks assessed    Lower Extremity Assessment Lower Extremity Assessment: Overall WFL for tasks assessed    Cervical / Trunk Assessment Cervical / Trunk Assessment: Normal  Communication   Communication: No difficulties  Cognition Arousal/Alertness: Awake/alert Behavior During Therapy: WFL for tasks assessed/performed Overall Cognitive Status: Within Functional Limits for tasks assessed                                          General Comments General comments (skin integrity, edema, etc.): VSS HR 65 Bp 118/98    Exercises     Assessment/Plan    PT Assessment Patient needs continued PT services  PT Problem List Decreased strength;Decreased balance;Decreased activity tolerance;Decreased mobility       PT Treatment Interventions DME instruction;Gait training;Functional mobility training;Therapeutic activities;Stair training;Therapeutic exercise;Balance training;Patient/family education    PT Goals (Current goals can be found in the Care Plan section)  Acute Rehab PT Goals Patient Stated Goal: to go home PT Goal Formulation: With patient Time For Goal Achievement: 05/12/21 Potential to Achieve Goals: Good Additional Goals Additional Goal #1: Patient will score >19/24 on DGI to reduce fall risk    Frequency Min 3X/week   Barriers to discharge        Co-evaluation               AM-PAC PT "6 Clicks" Mobility  Outcome Measure Help needed turning from your back to your side while in a flat bed without using  bedrails?: None Help needed moving from lying on your back to sitting on the side of a flat bed without using bedrails?: None Help needed moving to and from a bed to a chair (including a wheelchair)?: A Little Help needed standing up from a chair using your arms (e.g., wheelchair or bedside chair)?: A Little Help needed to walk in hospital room?: A Little Help needed climbing 3-5 steps with a railing? : A Little 6 Click Score: 20    End of Session   Activity Tolerance: Patient tolerated treatment well Patient left: in chair;with call bell/phone within reach;with chair alarm set Nurse Communication: Mobility status PT Visit Diagnosis: Unsteadiness on feet (R26.81);Muscle weakness (generalized) (M62.81)    Time: 9604-5409 PT Time Calculation (min) (ACUTE ONLY): 19 min   Charges:   PT Evaluation $PT Eval Moderate Complexity: 1 Mod          Alencia Gordon A. Gilford Rile PT, DPT Acute Rehabilitation Services Pager [phone] Office [phone]   Linna Hoff 04/28/2021, 9:47 AM

## 2021-05-01 LAB — LAMOTRIGINE LEVEL: Lamotrigine Lvl: <1 ug/mL — ABNORMAL LOW (ref 2.0–20.0)

## 2021-05-02 MED FILL — Midazolam HCl Inj 2 MG/2ML (Base Equivalent): INTRAMUSCULAR | Qty: 4 | Status: AC

## 2021-06-05 ENCOUNTER — Encounter: Payer: Self-pay | Admitting: Emergency Medicine

## 2021-06-05 ENCOUNTER — Emergency Department: Payer: Medicare HMO

## 2021-06-05 ENCOUNTER — Emergency Department
Admission: EM | Admit: 2021-06-05 | Discharge: 2021-06-05 | Disposition: A | Discharge status: Home / Self Care | Payer: Medicare HMO | Source: Home / Self Care | Attending: Emergency Medicine | Admitting: Emergency Medicine

## 2021-06-05 ENCOUNTER — Other Ambulatory Visit: Payer: Self-pay

## 2021-06-05 ENCOUNTER — Observation Stay
Admission: EM | Admit: 2021-06-05 | Discharge: 2021-06-06 | Disposition: A | Discharge status: Home / Self Care | Payer: Medicare HMO | Attending: Family Medicine | Admitting: Family Medicine

## 2021-06-05 DIAGNOSIS — F209 Schizophrenia, unspecified: Secondary | ICD-10-CM | POA: Diagnosis not present

## 2021-06-05 DIAGNOSIS — F3170 Bipolar disorder, currently in remission, most recent episode unspecified: Secondary | ICD-10-CM | POA: Diagnosis not present

## 2021-06-05 DIAGNOSIS — I2510 Atherosclerotic heart disease of native coronary artery without angina pectoris: Secondary | ICD-10-CM | POA: Insufficient documentation

## 2021-06-05 DIAGNOSIS — G40909 Epilepsy, unspecified, not intractable, without status epilepticus: Secondary | ICD-10-CM | POA: Insufficient documentation

## 2021-06-05 DIAGNOSIS — Z79899 Other long term (current) drug therapy: Secondary | ICD-10-CM | POA: Diagnosis not present

## 2021-06-05 DIAGNOSIS — I10 Essential (primary) hypertension: Secondary | ICD-10-CM | POA: Diagnosis not present

## 2021-06-05 DIAGNOSIS — R569 Unspecified convulsions: Secondary | ICD-10-CM | POA: Diagnosis present

## 2021-06-05 DIAGNOSIS — Z20822 Contact with and (suspected) exposure to covid-19: Secondary | ICD-10-CM | POA: Diagnosis not present

## 2021-06-05 DIAGNOSIS — F17200 Nicotine dependence, unspecified, uncomplicated: Secondary | ICD-10-CM | POA: Diagnosis not present

## 2021-06-05 DIAGNOSIS — K219 Gastro-esophageal reflux disease without esophagitis: Secondary | ICD-10-CM

## 2021-06-05 DIAGNOSIS — J45909 Unspecified asthma, uncomplicated: Secondary | ICD-10-CM | POA: Insufficient documentation

## 2021-06-05 LAB — MAGNESIUM: Magnesium: 1.8 mg/dL (ref 1.7–2.4)

## 2021-06-05 LAB — COMPREHENSIVE METABOLIC PANEL
ALT: 12 U/L (ref 0–44)
AST: 29 U/L (ref 15–41)
Albumin: 3.9 g/dL (ref 3.5–5.0)
Alkaline Phosphatase: 63 U/L (ref 38–126)
Anion gap: 12 (ref 5–15)
BUN: 8 mg/dL (ref 6–20)
CO2: 23 mmol/L (ref 22–32)
Calcium: 9.1 mg/dL (ref 8.9–10.3)
Chloride: 100 mmol/L (ref 98–111)
Creatinine, Ser: 0.83 mg/dL (ref 0.61–1.24)
GFR, Estimated: >60 mL/min (ref >60)
Glucose, Bld: 111 mg/dL — ABNORMAL HIGH (ref 70–99)
Potassium: 3.3 mmol/L — ABNORMAL LOW (ref 3.5–5.1)
Sodium: 135 mmol/L (ref 135–145)
Total Bilirubin: 0.7 mg/dL (ref 0.3–1.2)
Total Protein: 7.2 g/dL (ref 6.5–8.1)

## 2021-06-05 LAB — CBC WITH DIFFERENTIAL/PLATELET
Abs Immature Granulocytes: 0.04 10*3/uL (ref 0.00–0.07)
Basophils Absolute: 0 10*3/uL (ref 0.0–0.1)
Basophils Relative: 0 %
Eosinophils Absolute: 0.1 10*3/uL (ref 0.0–0.5)
Eosinophils Relative: 1 %
HCT: 41.6 % (ref 39.0–52.0)
Hemoglobin: 13.3 g/dL (ref 13.0–17.0)
Immature Granulocytes: 1 %
Lymphocytes Relative: 13 %
Lymphs Abs: 0.9 10*3/uL (ref 0.7–4.0)
MCH: 28.3 pg (ref 26.0–34.0)
MCHC: 32 g/dL (ref 30.0–36.0)
MCV: 88.5 fL (ref 80.0–100.0)
Monocytes Absolute: 0.6 10*3/uL (ref 0.1–1.0)
Monocytes Relative: 8 %
Neutro Abs: 5.6 10*3/uL (ref 1.7–7.7)
Neutrophils Relative %: 77 %
Platelets: 189 10*3/uL (ref 150–400)
RBC: 4.7 MIL/uL (ref 4.22–5.81)
RDW: 14.9 % (ref 11.5–15.5)
WBC: 7.3 10*3/uL (ref 4.0–10.5)
nRBC: 0 % (ref 0.0–0.2)

## 2021-06-05 LAB — RESP PANEL BY RT-PCR (FLU A&B, COVID) ARPGX2
Influenza A by PCR: NEGATIVE
Influenza B by PCR: NEGATIVE
SARS Coronavirus 2 by RT PCR: NEGATIVE

## 2021-06-05 LAB — BASIC METABOLIC PANEL
Anion gap: 10 (ref 5–15)
BUN: 11 mg/dL (ref 6–20)
CO2: 26 mmol/L (ref 22–32)
Calcium: 9.3 mg/dL (ref 8.9–10.3)
Chloride: 100 mmol/L (ref 98–111)
Creatinine, Ser: 0.94 mg/dL (ref 0.61–1.24)
GFR, Estimated: >60 mL/min (ref >60)
Glucose, Bld: 94 mg/dL (ref 70–99)
Potassium: 3.5 mmol/L (ref 3.5–5.1)
Sodium: 136 mmol/L (ref 135–145)

## 2021-06-05 LAB — ETHANOL: Alcohol, Ethyl (B): <10 mg/dL (ref <10)

## 2021-06-05 IMAGING — CT CT HEAD W/O CM
4 series · 16 of 47 positions shown, 18 images · non-contrast
Comparison: None.

CLINICAL DATA: Mental status change, unknown cause.  Seizures



[Series 2: head bone · axial · 0.45mm/px · z∈[-95,-61]mm · 3 of 87 slices shown]
[im 9/87  bone]
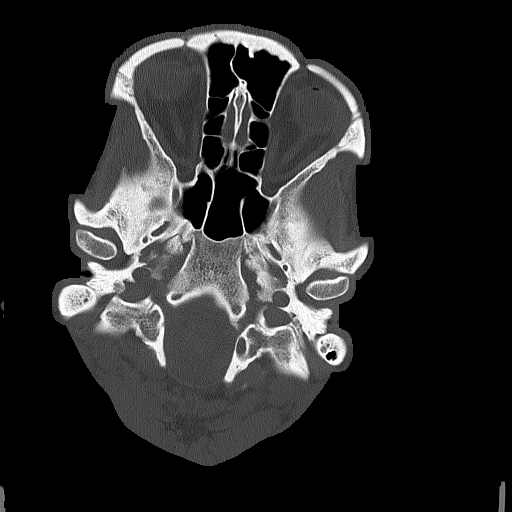
[im 18/87  bone]
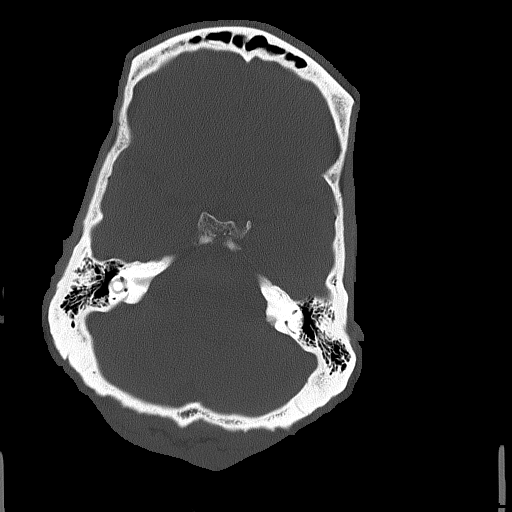
[im 26/87  bone]
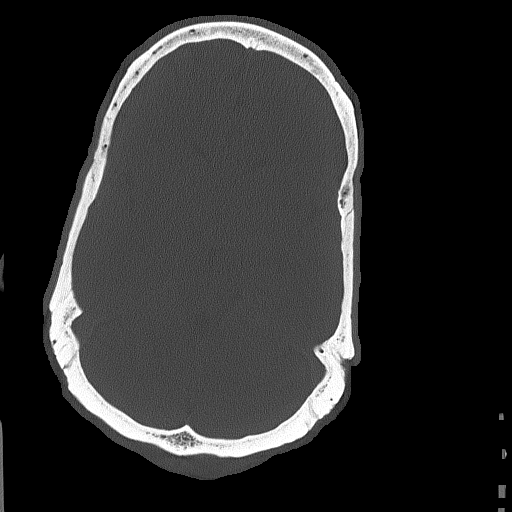

[Series 3: head wo · axial · 0.45mm/px · z∈[-91,+34]mm · 7 of 35 slices shown, 9 images]
[im 5/35  brain]
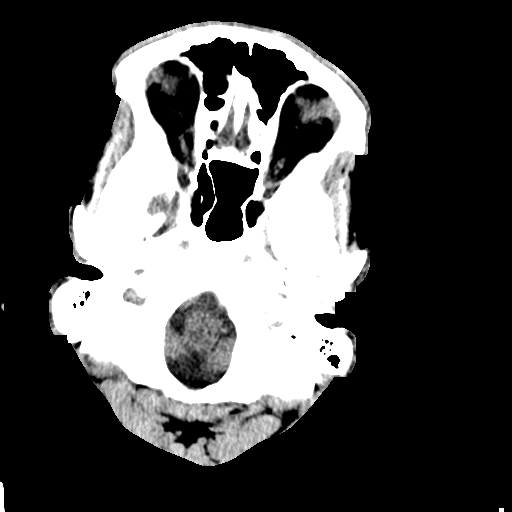
[im 5/35  bone]
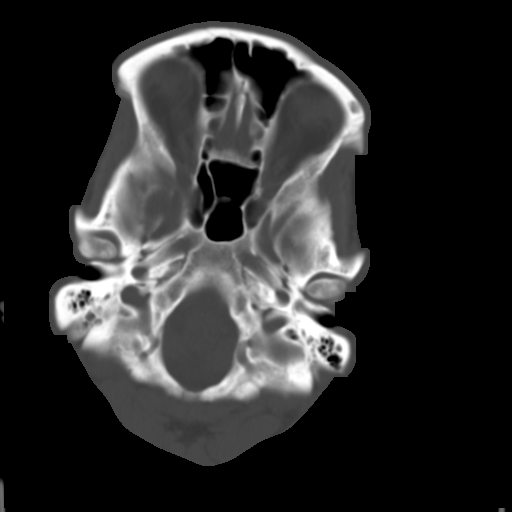
[im 9/35  brain]
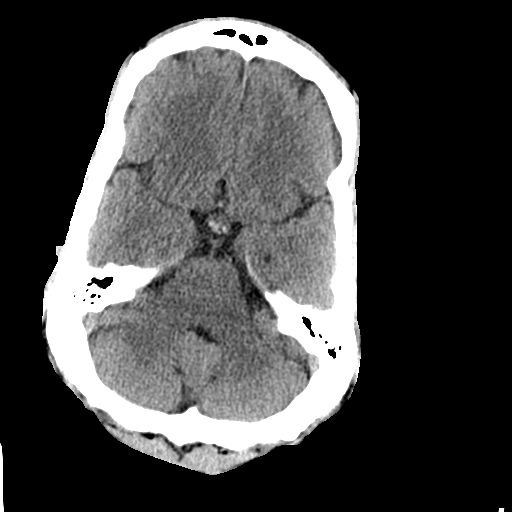
[im 13/35  brain]
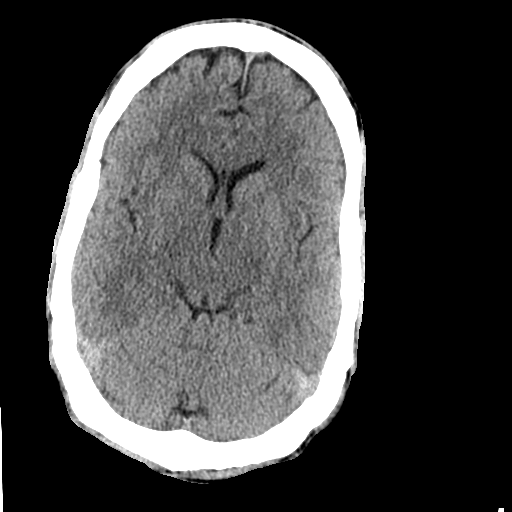
[im 18/35  brain]
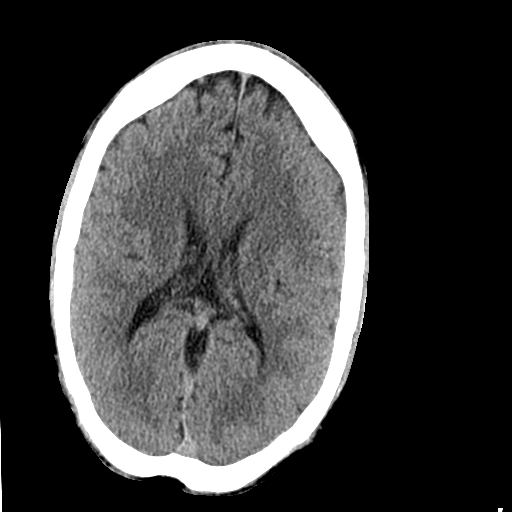
[im 22/35  brain]
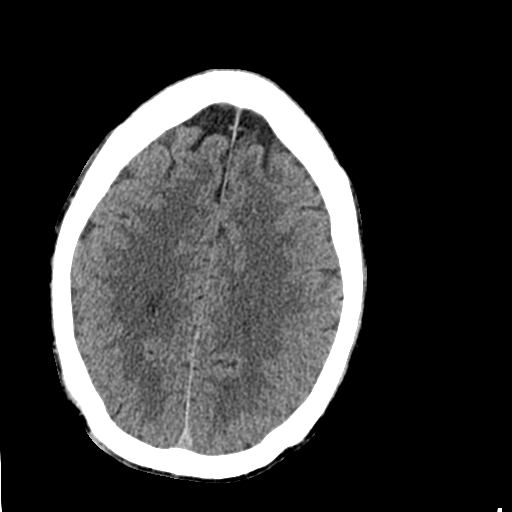
[im 22/35  bone]
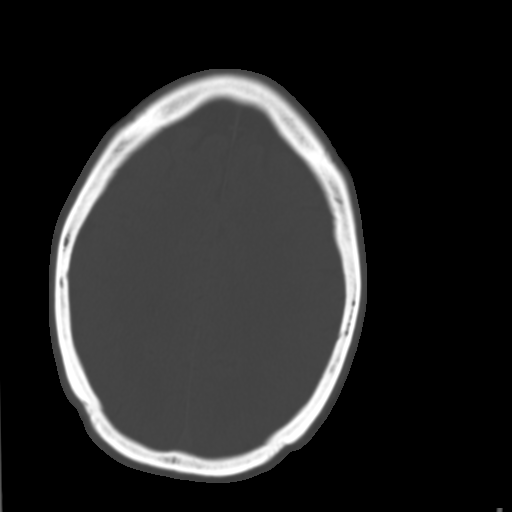
[im 26/35  brain]
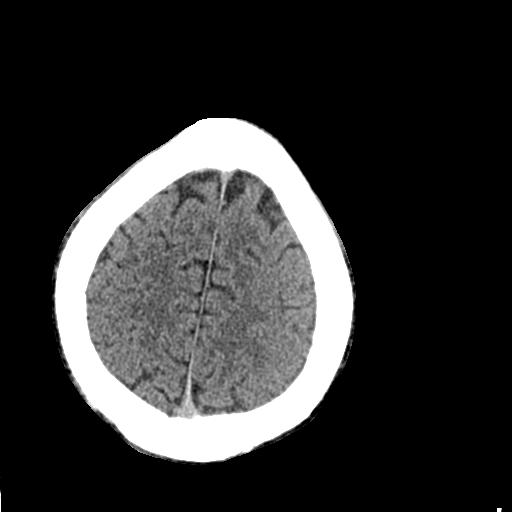
[im 30/35  brain]
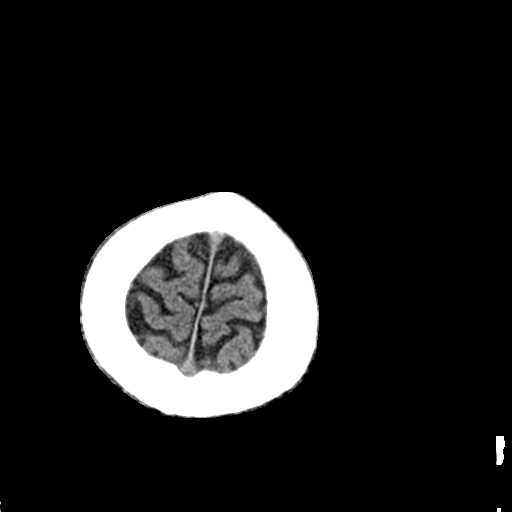

[Series 4: coronal soft tissue · coronal · 0.31mm/px · 3 of 73 slices shown]
[im 25/73  brain]
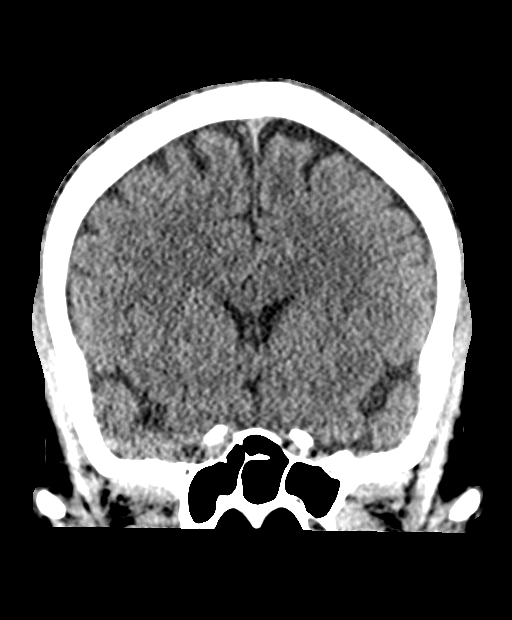
[im 33/73  brain]
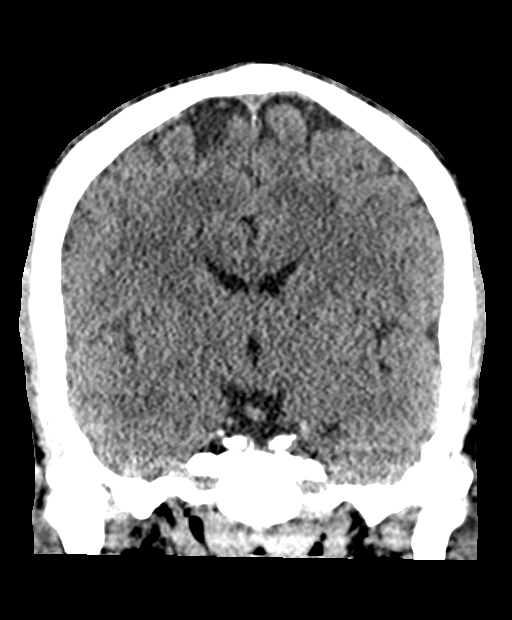
[im 41/73  brain]
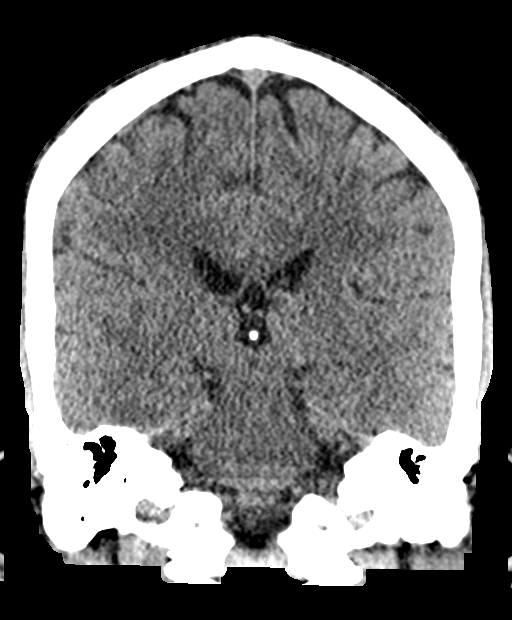

[Series 5: sagittal soft tissue · sagittal · 0.37mm/px · 3 of 53 slices shown]
[im 18/53  brain]
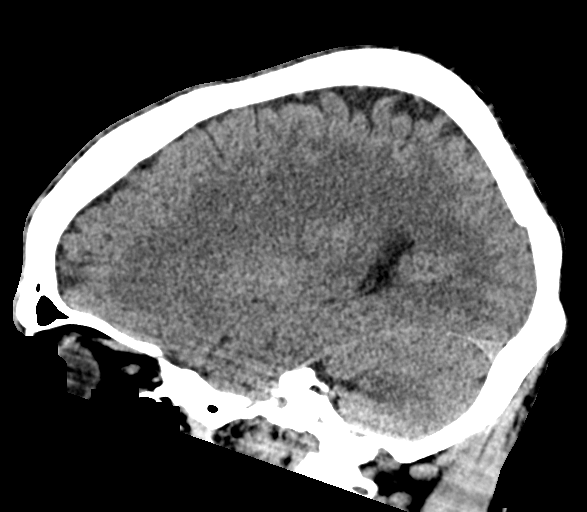
[im 27/53  brain]
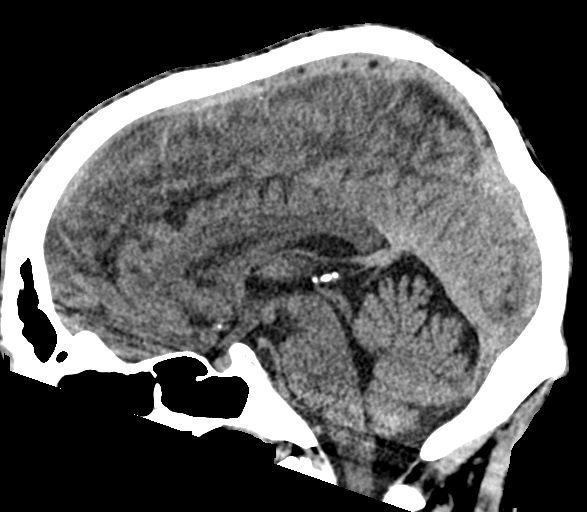
[im 35/53  brain]
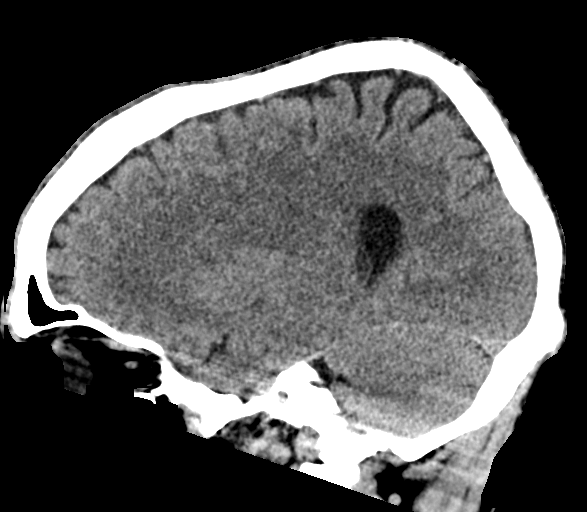

[16 of 47 positions shown; findings below may reference images not displayed]

FINDINGS: Brain:

No evidence of large-territorial acute infarction. No parenchymal
hemorrhage. No mass lesion. No extra-axial collection.

No mass effect or midline shift. No hydrocephalus. Basilar cisterns
are patent.

Vascular: No hyperdense vessel.

Skull: No acute fracture or focal lesion.

Sinuses/Orbits: Paranasal sinuses and mastoid air cells are clear.
The orbits are unremarkable.

Other: None.
IMPRESSION: No acute intracranial abnormality.

## 2021-06-05 MED ORDER — MAGNESIUM HYDROXIDE 400 MG/5ML PO SUSP
30.0000 mL | Freq: Every day | ORAL | Status: DC | PRN
Start: 2021-06-05 — End: 2021-06-06

## 2021-06-05 MED ORDER — ONDANSETRON HCL 4 MG/2ML IJ SOLN: 4.0000 mg | Freq: Four times a day (QID) | INTRAMUSCULAR | Status: DC | PRN

## 2021-06-05 MED ORDER — ALBUTEROL SULFATE (2.5 MG/3ML) 0.083% IN NEBU: 2.5000 mg | INHALATION_SOLUTION | RESPIRATORY_TRACT | Status: DC | PRN

## 2021-06-05 MED ORDER — METOPROLOL TARTRATE 50 MG PO TABS
100.0000 mg | ORAL_TABLET | Freq: Two times a day (BID) | ORAL | Status: DC
  Administered 2021-06-05: 100 mg
  Filled 2021-06-05: qty 2

## 2021-06-05 MED ORDER — LEVETIRACETAM IN NACL 1500 MG/100ML IV SOLN
1500.0000 mg | Freq: Once | INTRAVENOUS | Status: AC
  Administered 2021-06-05: 1500 mg via INTRAVENOUS
  Filled 2021-06-05: qty 100

## 2021-06-05 MED ORDER — BENZTROPINE MESYLATE 1 MG PO TABS: 0.5000 mg | ORAL_TABLET | Freq: Two times a day (BID) | ORAL | Status: DC | PRN

## 2021-06-05 MED ORDER — TRAZODONE HCL 50 MG PO TABS: 25.0000 mg | ORAL_TABLET | Freq: Every evening | ORAL | Status: DC | PRN

## 2021-06-05 MED ORDER — ACETAMINOPHEN 650 MG RE SUPP: 650.0000 mg | Freq: Four times a day (QID) | RECTAL | Status: DC | PRN

## 2021-06-05 MED ORDER — POTASSIUM CHLORIDE IN NACL 20-0.9 MEQ/L-% IV SOLN
INTRAVENOUS | Status: DC
  Filled 2021-06-05 (×2): qty 1000

## 2021-06-05 MED ORDER — ARIPIPRAZOLE ER 400 MG IM PRSY: 400.0000 mg | PREFILLED_SYRINGE | INTRAMUSCULAR | Status: DC

## 2021-06-05 MED ORDER — ENOXAPARIN SODIUM 40 MG/0.4ML IJ SOSY
40.0000 mg | PREFILLED_SYRINGE | INTRAMUSCULAR | Status: DC
  Administered 2021-06-05: 40 mg via SUBCUTANEOUS
  Filled 2021-06-05: qty 0.4

## 2021-06-05 MED ORDER — LAMOTRIGINE 25 MG PO TABS: 25.0000 mg | ORAL_TABLET | ORAL | Status: DC

## 2021-06-05 MED ORDER — ACETAMINOPHEN 325 MG PO TABS: 650.0000 mg | ORAL_TABLET | Freq: Four times a day (QID) | ORAL | Status: DC | PRN

## 2021-06-05 MED ORDER — ONDANSETRON HCL 4 MG PO TABS: 4.0000 mg | ORAL_TABLET | Freq: Four times a day (QID) | ORAL | Status: DC | PRN

## 2021-06-05 MED ORDER — LEVETIRACETAM IN NACL 500 MG/100ML IV SOLN
500.0000 mg | Freq: Once | INTRAVENOUS | Status: AC
Start: 2021-06-05 — End: 2021-06-05
  Administered 2021-06-05: 500 mg via INTRAVENOUS
  Filled 2021-06-05: qty 100

## 2021-06-05 MED ORDER — AMLODIPINE BESYLATE 5 MG PO TABS
10.0000 mg | ORAL_TABLET | Freq: Every day | ORAL | Status: DC
  Administered 2021-06-05: 10 mg
  Filled 2021-06-05: qty 2

## 2021-06-05 MED ORDER — CLONAZEPAM 0.5 MG PO TABS: 0.5000 mg | ORAL_TABLET | Freq: Three times a day (TID) | ORAL | Status: DC | PRN

## 2021-06-05 MED ORDER — MIRTAZAPINE 15 MG PO TABS
7.5000 mg | ORAL_TABLET | Freq: Every day | ORAL | Status: DC
  Administered 2021-06-05: 7.5 mg via ORAL
  Filled 2021-06-05: qty 1

## 2021-06-05 MED ORDER — LEVETIRACETAM 100 MG/ML PO SOLN
1000.0000 mg | Freq: Two times a day (BID) | ORAL | Status: DC
  Administered 2021-06-05: 1000 mg
  Filled 2021-06-05 (×3): qty 10

## 2021-06-05 NOTE — ED Notes (Signed)
Called [facility]) they are aware of neurology consult and will be getting to it.

## 2021-06-05 NOTE — ED Triage Notes (Signed)
Pt BIB EMS from home c/o seizure witnessed by "housemates" lasted about 62mins, mix story whether pt takes meds or not. Postical when they arrived. Pt now AO x 4, pt denies any complaints at this time, states "I feel okay, I take seizure meds, I had it this morning when I was here". Pt was just here this morning for same at 8AM.   138/96 HR 90 96% RA Rt AC 20g.  CBG 148

## 2021-06-05 NOTE — ED Triage Notes (Signed)
Pt to ED via [facility] with c/o seizures. Family witnessed the seizure, she told EMS it lasted about 10 minutes. Pt was postictal when they arrived. Pt is WNL at this time.

## 2021-06-05 NOTE — ED Provider Notes (Signed)
[facility] Provider Note    Event Date/Time   First MD Initiated Contact with Patient 06/05/21 1648     (approximate)   History   No chief complaint on file.   HPI  Luis Hunter is a 46 y.o. male with PMH of depression, ABD, schizophrenia, seizure disorder, prior polysubstance use and asthma most recently evaluated earlier today following a seizure with unremarkable work-up and patient back to his neurological baseline having received 1500 mg of IV Keppra who presents via EMS for evaluation after another seizure that occurred after he left the hospital.  Patient states he does not remember this and further history is somewhat limited from him regarding what exactly happened today.  Per EMS report it was witnessed by his housemates at his group home.  Patient is denying any other acute complaints and does not think he fell or hit his head this is no headache, neck pain, abdominal pain, chest pain, shortness of breath or any focal extremity weakness numbness or tingling.  He states he has been compliant with medications and denies EtOH use or any illegal drugs.Marland Kitchen  He endorses some chronic hoarseness from history of being intubated.      Physical Exam  Triage Vital Signs: ED Triage Vitals  Enc Vitals Group     BP      Pulse      Resp      Temp      Temp src      SpO2      Weight      Height      Head Circumference      Peak Flow      Pain Score      Pain Loc      Pain Edu?      Excl. in [place]?     Most recent vital signs: Vitals:   06/05/21 1930 06/05/21 2130  BP: (!) 142/98 (!) 120/96  Pulse:  90  Resp:  17  Temp:    SpO2:  100%    General: Awake, no distress.  CV:  Good peripheral perfusion.  Resp:  Normal effort.  Clear bilaterally. Abd:  No distention.  Other:  Nerves II through XII are grossly intact.  Patient does seem a little sleepy but does not seem intoxicated.  He is able to reliably follow commands.  Cranial nerves II through XII  are grossly intact.  He is symmetric strength in his bilateral upper lower extremities.   ED Results / Procedures / Treatments  Labs (all labs ordered are listed, but only abnormal results are displayed) Labs Reviewed  RESP PANEL BY RT-PCR (FLU A&B, COVID) [id number]  BASIC METABOLIC PANEL  MAGNESIUM  ETHANOL  CBG MONITORING, ED     EKG  CT head shows no evidence of hemorrhage, ischemia, edema, mass-effect ventriculomegaly or other acute process.  I interpreted this.  Also reviewed radiology interpretation and agree with her findings.   RADIOLOGY     PROCEDURES:  Critical Care performed: No  .1-3 Lead EKG Interpretation Performed by: Lucrezia Starch, MD Authorized by: Lucrezia Starch, MD     Interpretation: normal     ECG rate assessment: normal     Rhythm: sinus rhythm     Ectopy: none     Conduction: normal    The patient is on the cardiac monitor to evaluate for evidence of arrhythmia and/or significant heart rate changes.   MEDICATIONS ORDERED IN ED: Medications  levETIRAcetam (KEPPRA)  IVPB 1500 mg/ 100 mL premix (0 mg Intravenous Stopped 06/05/21 2007)     IMPRESSION / MDM / ASSESSMENT AND PLAN / ED COURSE  I reviewed the triage vital signs and the nursing notes.                              Differential diagnosis includes, but is not limited to breakthrough seizure with lower suspicion for syncope given history of seizure disorders an episode today being witnessed and description given to EMS of some generalized shaking.  Patient did receive Keppra IV this morning and had previously been on 2 antiepileptics but had been taken off one a couple of weeks ago due to a rash.  He denies any other sick symptoms or illegal drug use or illicit drug use.  BMP shows no significant electrolyte or metabolic derangements.  Serum ethanol undetectable  CT head shows no evidence of hemorrhage, ischemia, edema, mass-effect ventriculomegaly or other acute process.  I  interpreted this.  Also reviewed radiology interpretation and agree with her findings.  I reviewed patient's ED visit from earlier today including labs and work-up noted patient received 500 mg of Keppra prior to discharge.  Given concern that this is a recurrent seizure today without obvious precipitating factor and that this is a fairly low dose I did reach out to on-call telemetry neurologist Who recommended 1500 mg and starting patient.  Did to start this on patient.  Telemetry neurologist also recommended observation overnight and the patient tolerated his higher dose Keppra had no more seizures discharged tomorrow morning with outpatient follow-up.  I discussed this with medic hospitalist who agreed with this and will place admission orders.   FINAL CLINICAL IMPRESSION(S) / ED DIAGNOSES   Final diagnoses:  Seizure ([place])     Rx / DC Orders   ED Discharge Orders     None        Note:  This document was prepared using Dragon voice recognition software and may include unintentional dictation errors.   Lucrezia Starch, MD 06/05/21 2212

## 2021-06-05 NOTE — Discharge Instructions (Addendum)
Please increase your Keppra to 1200 mg twice a day.  This will be 4 mls of the Keppra liquid that is 300 mg/mL.  Please follow-up with your neurologist in the next week or 2 or at least call him on the phone.  Let him know about the increased dose.  Please return for any problems.

## 2021-06-05 NOTE — Consult Note (Signed)
[facility] TeleNeurology Consult Services  Stat Consult  Patient Name:   Luis, Hunter Date of Birth:   Apr 02, 1976 Identification Number:   MRN - [id number] Date of Service:   06/05/2021 19:11:15  Diagnosis:       R56.9 - Seizures  Impression Luis Hunter is a 46 yo M w/ a hx of TBI and seizures who presents with spell concerning for seizure. Exam notable for mild aphasia and dysarthria which is likely chronic and related to his TBI. Head CT showed no acute intracranial pathology. Presentation consistent with seizure. Recommend the following: - Increase Keppra to 1500 mg bid - infectious and metabolic eval - admit to obs overnight; okay for discharge early afternoon tomorrow if no concerning spells - Outpatient neurology follow-up - No driving for 6 months  CT HEAD: Reviewed  Our recommendations are outlined below.  Laboratory Studies: Comprehensive Metabolic profile CBC with diff  Seizure precautions: Seizure precautions including no driving for state mandated time frame were discussed with patient with clear understanding   Imaging Head CT: No acute intracranial pathology  Metrics: [facility] Notification Time: 06/05/2021 19:11:15 Stamp Time: 06/05/2021 19:11:15 Callback Response Time: 06/05/2021 19:14:17   ----------------------------------------------------------------------------------------------------  Chief Complaint: spell concerning for seizure  History of Present Illness: Patient is a 46 year old Male. Luis Hunter is a 46 yo M w/ a hx of TBI and seizures who presents with spell concerning for seizure. Per patient, had an episode of LOC and limb jerking lasting for 10 min this morning. Presented to the hospital and received Keppra load. Was discharged and had another episode of LOC and limb jerking. Prior to today, last seizure was in December. Typically has a few seizures a year. Currently on Keppra 1g bid. States he missed one dose yesterday. Smokes  marijuana but denies other substance abuse. Exam notable for mild aphasia and dysarthria which is likely chronic and related to his TBI. Head CT showed no acute intracranial pathology.   Medications:  No Anticoagulant use  No Antiplatelet use Reviewed EMR for current medications  Allergies:  Reviewed  Social History: Smoking: No Alcohol Use: No Drug Use: No  Family History:  There is no family history of premature cerebrovascular disease pertinent to this consultation  ROS : 14 Points Review of Systems was performed and was negative except mentioned in HPI.  Past Surgical History: There Is No Surgical History Contributory To Todays Visit   Examination: BP(120/96), Pulse(90), Blood Glucose(111)  Neuro Exam:  Speech: Fluent: slight impairment in fluency, mild dysarthria (may be baseline)  Language: Intact:  Face: Symmetric:  Facial Sensation: Intact:  Visual Fields: Intact:  Extraocular Movements: Intact:  Motor Exam: No Drift:  Sensation: Intact:  Coordination: Intact:     Patient / Family was informed the Neurology Consult would occur via TeleHealth consult by way of interactive audio and video telecommunications and consented to receiving care in this manner.  Patient is being evaluated for possible acute neurologic impairment and high probability of imminent or life - threatening deterioration.I spent total of 35 minutes providing care to this patient, including time for face to face visit via telemedicine, review of medical records, imaging studies and discussion of findings with providers, the patient and / or family.   Dr Abbe Amsterdam   [facility] [phone]  Case [id number]

## 2021-06-05 NOTE — ED Provider Notes (Signed)
[facility] Provider Note    Event Date/Time   First MD Initiated Contact with Patient 06/05/21 [phone]     (approximate)   History   Seizures Chief complaint seizure  HPI  Luis Hunter is a 46 y.o. male PMH of depression, schizophrenia, seizure disorder, prior polysubstance use and asthma .  He was reported to be at home today and had a 10-minute long seizure.  Was brought in by EMS.  He was initially postictal but is now alert and oriented.  Patient takes Keppra and Lamictal according to his old records. Patient reports Lamictal gave him a rash and he is taking Keppra 900 mg liquid twice daily.  That would be 3 cc of these oral solution twice daily.  Patient denies any fever or coughing sneezing chest pain bellyache nausea vomiting or other symptoms.      Physical Exam   Triage Vital Signs: ED Triage Vitals  Enc Vitals Group     BP      Pulse      Resp      Temp      Temp src      SpO2      Weight      Height      Head Circumference      Peak Flow      Pain Score      Pain Loc      Pain Edu?      Excl. in [place]?     Most recent vital signs: Vitals:   06/05/21 0756  BP: (!) 148/109  Pulse: 90  Resp: 20  Temp: 97.9 F (36.6 C)  SpO2: 94%     General: Awake, no distress.  CV:  Good peripheral perfusion.  Resp:  Normal effort.  Lungs are clear Abd:  No distention.  Soft and nontender Extremities: No edema Neurologic cranial nerves II through XII are intact although visual fields were not checked cerebellar finger-nose is slightly slow but there is no ataxia motor strength is 5/5 through.   ED Results / Procedures / Treatments   Labs (all labs ordered are listed, but only abnormal results are displayed) Labs Reviewed  COMPREHENSIVE METABOLIC PANEL - Abnormal; Notable for the following components:      Result Value   Potassium 3.3 (*)    Glucose, Bld 111 (*)    All other components within normal limits  CBC WITH  DIFFERENTIAL/PLATELET  LEVETIRACETAM LEVEL  LAMOTRIGINE LEVEL     EKG  EKG from EMS read interpreted by me shows sinus tachycardia at approximately 120 normal axis high peak T waves in V3 through 5 these are similar to 1 from several days ago.  Probably LVH.  EKG here read interpreted by me shows normal sinus rhythm rate of 89 normal axis LVH and/or early repull likely.  There is some ST elevation in many leads.  The patient has no chest pain or tightness. RADIOLOGY    PROCEDURES:  Critical Care performed:   Procedures   MEDICATIONS ORDERED IN ED: Medications  levETIRAcetam (KEPPRA) IVPB 500 mg/100 mL premix (500 mg Intravenous New Bag/Given 06/05/21 0858)     IMPRESSION / MDM / ASSESSMENT AND PLAN / ED COURSE  I reviewed the triage vital signs and the nursing notes. Old records were reviewed especially last hospital admission.  Old EKGs and lab work was also reviewed labs CBC electrolytes and liver functions are stable.  Patient is no longer taking lamotrigine because of the  rash.   The patient is on the cardiac monitor to evaluate for evidence of arrhythmia and/or significant heart rate changes.  Patient still taking 900 mg of Keppra liquid or 3 cc of the 300 mg/mL twice a day.  I spoke with neurology at [facility] and discussed the patient with them we will increase this to 2200 mg or 4 cc of the liquid twice a day.  I will asked the patient to follow-up with his neurologist ASAP and return for any further problems.     FINAL CLINICAL IMPRESSION(S) / ED DIAGNOSES   Final diagnoses:  Seizure ([place])     Rx / DC Orders   ED Discharge Orders     None        Note:  This document was prepared using Dragon voice recognition software and may include unintentional dictation errors.   Nena Polio, MD 06/05/21 [phone]

## 2021-06-05 NOTE — H&P (Signed)
[place]   PATIENT NAME: Luis Hunter    MR#:  [id number]  DATE OF BIRTH:  01-10-1976  DATE OF ADMISSION:  06/05/2021  PRIMARY CARE PHYSICIAN: [facility] [place]   Patient is coming from: Home  REQUESTING/REFERRING PHYSICIAN: Hulan Saas, MD  CHIEF COMPLAINT:  Seizure  HISTORY OF PRESENT ILLNESS:  Luis Hunter is a 46 y.o. male with medical history significant for bipolar disorder, schizophrenia, coronary artery disease status post MI, essential hypertension, and GERD, presents emergency room with acute onset of seizure that occurred earlier today for which he was seen here in the ER and had unremarkable work-up.  He was at his baseline after receiving 1500 mg of IV Keppra.  He presents again this evening with another seizure episode that occurred after he left the hospital.  Previously it was witnessed by his housemates at his group home.  He is denying any acute complaints.  No reported head injury or neck pain.  He denied any paresthesias or focal muscle weakness.  No chest pain or palpitations.  No cough or wheezing or hemoptysis.  No dysuria, oliguria or hematuria or flank pain.  He has chronic hoarseness from previous intubation.     ED Course: Upon presentation to the ER, BP was 141/102 with otherwise normal vital signs.  Labs revealed borderline potassium of 3.5 with otherwise unremarkable BMP and CBC was within normal.Influenza antigens and COVID-19 PCR came back negative.  Alcohol levels less than 10. EKG as reviewed by me : EKG showed earlier today normal sinus rhythm with rate of 89 with minimal voltage criteria for LVH and T wave inversion laterally Imaging: Noncontrast CT scan revealed no acute intracranial abnormalities.  The patient will be admitted to an observation medically monitored bed for further evaluation and management. PAST MEDICAL HISTORY:   Past Medical History:  Diagnosis Date   Bipolar disorder ([name])    Depression     Schizophrenia ([place])    Seizures ([place])     PAST SURGICAL HISTORY:   Past Surgical History:  Procedure Laterality Date   FINGER SURGERY     IR GASTROSTOMY TUBE MOD SED  01/16/2021   LEFT HEART CATH AND CORONARY ANGIOGRAPHY N/A 12/25/2020   Procedure: LEFT HEART CATH AND CORONARY ANGIOGRAPHY;  Surgeon: Nelva Bush, MD;  Location: [facility];  Service: Cardiovascular;  Laterality: N/A;    SOCIAL HISTORY:   Social History   Tobacco Use   Smoking status: Every Day   Smokeless tobacco: Never  Substance Use Topics   Alcohol use: No    Alcohol/week: 0.0 standard drinks    FAMILY HISTORY:  No family history on file.  DRUG ALLERGIES:   Allergies  Allergen Reactions   Lacosamide     EOSINOPHILIA   Valproic Acid And Related     hyperammonemia   Lamictal [Lamotrigine] Rash    REVIEW OF SYSTEMS:   ROS As per history of present illness. All pertinent systems were reviewed above. Constitutional, HEENT, cardiovascular, respiratory, GI, GU, musculoskeletal, neuro, psychiatric, endocrine, integumentary and hematologic systems were reviewed and are otherwise negative/unremarkable except for positive findings mentioned above in the HPI.   MEDICATIONS AT HOME:   Prior to Admission medications   Medication Sig Start Date End Date Taking? Authorizing Provider  acetaminophen (TYLENOL) 160 MG/5ML solution Place 20.3 mLs (650 mg total) into feeding tube every 4 (four) hours as needed for mild pain (temp > 101.5). Patient not taking: Reported on 04/26/2021 01/16/21  [place] Crane M, PA-C  albuterol (PROVENTIL) (2.5 MG/3ML) 0.083% nebulizer solution Take 3 mLs (2.5 mg total) by nebulization every 4 (four) hours as needed for wheezing or shortness of breath. 01/16/21   Lestine Mount, PA-C  amLODipine (NORVASC) 10 MG tablet Place 1 tablet (10 mg total) into feeding tube daily. Patient not taking: Reported on 04/26/2021 01/17/21   Lestine Mount, PA-C  arformoterol  [facility]) 15 MCG/2ML NEBU Take 2 mLs (15 mcg total) by nebulization 2 (two) times daily. Patient not taking: Reported on 04/26/2021 01/16/21   Lestine Mount, PA-C  ARIPiprazole ER (ABILIFY MAINTENA) 400 MG PRSY prefilled syringe Inject 400 mg into the muscle every 30 (thirty) days. Resume after discharge from hospital. Do not take concurrently with PO/VT Abilify. 01/16/21   Lestine Mount, PA-C  benztropine (COGENTIN) 0.5 MG tablet Take 1 tablet (0.5 mg total) by mouth 2 (two) times daily. Resume on discharge from hospital. Patient taking differently: Take 0.5 mg by mouth 2 (two) times daily as needed for tremors. Resume on discharge from hospital. 01/16/21   Lestine Mount, PA-C  budesonide (PULMICORT) 0.25 MG/2ML nebulizer solution Take 2 mLs (0.25 mg total) by nebulization 2 (two) times daily. Patient not taking: Reported on 04/26/2021 01/16/21   Lestine Mount, PA-C  clonazePAM (KLONOPIN) 0.5 MG tablet Take 2 tablets by G-TUBE every 8 hours as needed for anxiety. 03/17/21     lamoTRIgine (LAMICTAL) 25 MG tablet Wk1:1tab night wk2:1tab 2xday wk3:1tab AM 2tabsPM wk4:2tabs 2xday wk5:2tabs AM 3tabs PM wk6:3tabs 2xday wk7:3tabs AM 4tabs PM, wk8:4tab2xday 04/23/21   [provider]  lansoprazole (PREVACID SOLUTAB) 30 MG disintegrating tablet Take 1 tablet by G-TUBE daily Patient not taking: Reported on 04/26/2021 03/17/21     levETIRAcetam (KEPPRA) 100 MG/ML solution Place 10 mLs (1,000 mg total) into feeding tube every 12 (twelve) hours. 04/28/21   Mercy Riding, MD  metoprolol tartrate (LOPRESSOR) 100 MG tablet Place 1 tablet (100 mg total) into feeding tube 2 (two) times daily. Patient not taking: Reported on 04/26/2021 01/16/21   Lestine Mount, PA-C  mirtazapine (REMERON) 15 MG tablet Take 1/2 tablet via G-TUBE nightly at bedtime 03/17/21     scopolamine (TRANSDERM-SCOP) 1 MG/3DAYS Place 1 film to hairless areas behind one ear every 3 days for 14 days.  Remove old patch,  fold & discard away from children & pets. Patient taking differently: Place 1 patch onto the skin every 3 (three) days. 03/17/21         VITAL SIGNS:  Blood pressure (!) 120/96, pulse 90, temperature 98.9 F (37.2 C), temperature source Oral, resp. rate 17, SpO2 100 %.  PHYSICAL EXAMINATION:  Physical Exam  GENERAL:  46 y.o.-year-old patient lying in the bed with no acute distress.  He was still somnolent but arousable. EYES: Pupils equal, round, reactive to light and accommodation. No scleral icterus. Extraocular muscles intact.  HEENT: Head atraumatic, normocephalic. Oropharynx and nasopharynx clear.  NECK:  Supple, no jugular venous distention. No thyroid enlargement, no tenderness.  LUNGS: Normal breath sounds bilaterally, no wheezing, rales,rhonchi or crepitation. No use of accessory muscles of respiration.  CARDIOVASCULAR: Regular rate and rhythm, S1, S2 normal. No murmurs, rubs, or gallops.  ABDOMEN: Soft, nondistended, nontender. Bowel sounds present. No organomegaly or mass.  EXTREMITIES: No pedal edema, cyanosis, or clubbing.  NEUROLOGIC: Grossly nonfocal. PSYCHIATRIC: The patient is alert and cooperative.   SKIN: No obvious rash, lesion, or ulcer.   LABORATORY PANEL:   CBC Recent Labs  Lab 06/05/21 0757  WBC 7.3  HGB 13.3  HCT 41.6  PLT 189   ------------------------------------------------------------------------------------------------------------------  Chemistries  Recent Labs  Lab 06/05/21 0757 06/05/21 1724  NA 135 136  K 3.3* 3.5  CL 100 100  CO2 23 26  GLUCOSE 111* 94  BUN 8 11  CREATININE 0.83 0.94  CALCIUM 9.1 9.3  MG 1.8  --   AST 29  --   ALT 12  --   ALKPHOS 63  --   BILITOT 0.7  --    ------------------------------------------------------------------------------------------------------------------  Cardiac Enzymes No results for input(s): TROPONINI in the last 168  hours. ------------------------------------------------------------------------------------------------------------------  RADIOLOGY:  CT HEAD WO CONTRAST (5MM)  Result Date: 06/05/2021 CLINICAL DATA:  Mental status change, unknown cause.  Seizures EXAM: CT HEAD WITHOUT CONTRAST TECHNIQUE: Contiguous axial images were obtained from the base of the skull through the vertex without intravenous contrast. RADIATION DOSE REDUCTION: This exam was performed according to the departmental dose-optimization program which includes automated exposure control, adjustment of the mA and/or kV according to patient size and/or use of iterative reconstruction technique. COMPARISON:  None. FINDINGS: Brain: No evidence of large-territorial acute infarction. No parenchymal hemorrhage. No mass lesion. No extra-axial collection. No mass effect or midline shift. No hydrocephalus. Basilar cisterns are patent. Vascular: No hyperdense vessel. Skull: No acute fracture or focal lesion. Sinuses/Orbits: Paranasal sinuses and mastoid air cells are clear. The orbits are unremarkable. Other: None. IMPRESSION: No acute intracranial abnormality. Electronically Signed   By: Iven Finn M.D.   On: 06/05/2021 17:42      IMPRESSION AND PLAN:  Principal Problem:   Seizure ([place])  1.  Recurrent  seizures. - The patient will be admitted and medical telemetry observation bed. - We will place him on seizure precautions. - He will be placed on as needed IV Ativan. - He was loaded with IV Keppra and will be continued on it. - Neurology consult to be obtained. - I notified Dr. Quinn Axe about the patient.  2.  Schizophrenia. - We will continue Abilify and Cogentin.  3.  Bipolar disorder. - We will continue Klonopin and will Lamictal.  4.  GERD. - We will continue PPI therapy.  5.  Coronary artery disease status post MI. - Continue Lopressor.  6.  Essential hypertension. - We will continue Lopressor and Norvasc.  DVT  prophylaxis: Lovenox.. Code Status: full code.  Family Communication:  The plan of care was discussed in details with the patient (and family). I answered all questions. The patient agreed to proceed with the above mentioned plan. Further management will depend upon hospital course. Disposition Plan: Back to previous home environment Consults called: Neurology.  All the records are reviewed and case discussed with ED provider.  Status is: Observation  I certify that at the time of admission, it is my clinical judgment that the patient will require inpatient hospital care extending less than 2 midnights.                            Dispo: The patient is from: Home              Anticipated d/c is to: Home              Patient currently is not medically stable to d/c.              Difficult to place patient: No  Christel Mormon M.D on 06/05/2021 at 10:16 PM  [facility]   From 7 PM-7 AM, contact night-coverage [email or web address]  CC: Primary care physician; [facility] [place]

## 2021-06-06 DIAGNOSIS — G40909 Epilepsy, unspecified, not intractable, without status epilepticus: Secondary | ICD-10-CM | POA: Diagnosis not present

## 2021-06-06 DIAGNOSIS — R569 Unspecified convulsions: Secondary | ICD-10-CM | POA: Diagnosis not present

## 2021-06-06 LAB — COMPREHENSIVE METABOLIC PANEL
ALT: 12 U/L (ref 0–44)
AST: 21 U/L (ref 15–41)
Albumin: 3.5 g/dL (ref 3.5–5.0)
Alkaline Phosphatase: 58 U/L (ref 38–126)
Anion gap: 7 (ref 5–15)
BUN: 17 mg/dL (ref 6–20)
CO2: 24 mmol/L (ref 22–32)
Calcium: 8.7 mg/dL — ABNORMAL LOW (ref 8.9–10.3)
Chloride: 106 mmol/L (ref 98–111)
Creatinine, Ser: 0.92 mg/dL (ref 0.61–1.24)
GFR, Estimated: >60 mL/min (ref >60)
Glucose, Bld: 212 mg/dL — ABNORMAL HIGH (ref 70–99)
Potassium: 3.4 mmol/L — ABNORMAL LOW (ref 3.5–5.1)
Sodium: 137 mmol/L (ref 135–145)
Total Bilirubin: 0.9 mg/dL (ref 0.3–1.2)
Total Protein: 6.4 g/dL — ABNORMAL LOW (ref 6.5–8.1)

## 2021-06-06 LAB — CBC WITH DIFFERENTIAL/PLATELET
Abs Immature Granulocytes: 0.04 10*3/uL (ref 0.00–0.07)
Basophils Absolute: 0 10*3/uL (ref 0.0–0.1)
Basophils Relative: 0 %
Eosinophils Absolute: 0.1 10*3/uL (ref 0.0–0.5)
Eosinophils Relative: 1 %
HCT: 41.4 % (ref 39.0–52.0)
Hemoglobin: 13.2 g/dL (ref 13.0–17.0)
Immature Granulocytes: 0 %
Lymphocytes Relative: 9 %
Lymphs Abs: 1.1 10*3/uL (ref 0.7–4.0)
MCH: 28.2 pg (ref 26.0–34.0)
MCHC: 31.9 g/dL (ref 30.0–36.0)
MCV: 88.5 fL (ref 80.0–100.0)
Monocytes Absolute: 0.5 10*3/uL (ref 0.1–1.0)
Monocytes Relative: 4 %
Neutro Abs: 10 10*3/uL — ABNORMAL HIGH (ref 1.7–7.7)
Neutrophils Relative %: 86 %
Platelets: 194 10*3/uL (ref 150–400)
RBC: 4.68 MIL/uL (ref 4.22–5.81)
RDW: 15.3 % (ref 11.5–15.5)
WBC: 11.7 10*3/uL — ABNORMAL HIGH (ref 4.0–10.5)
nRBC: 0 % (ref 0.0–0.2)

## 2021-06-06 LAB — LAMOTRIGINE LEVEL: Lamotrigine Lvl: <1 ug/mL — ABNORMAL LOW (ref 2.0–20.0)

## 2021-06-06 LAB — PROTIME-INR
INR: 1.1 (ref 0.8–1.2)
Prothrombin Time: 14.3 seconds (ref 11.4–15.2)

## 2021-06-06 LAB — LEVETIRACETAM LEVEL: Levetiracetam Lvl: 11.4 ug/mL (ref 10.0–40.0)

## 2021-06-06 MED ORDER — AMLODIPINE BESYLATE 5 MG PO TABS
10.0000 mg | ORAL_TABLET | Freq: Every day | ORAL | Status: DC
  Administered 2021-06-06: 10 mg via ORAL
  Filled 2021-06-06: qty 2

## 2021-06-06 MED ORDER — OXCARBAZEPINE 150 MG PO TABS: 150.0000 mg | ORAL_TABLET | Freq: Two times a day (BID) | ORAL | 3 refills | Status: DC

## 2021-06-06 MED ORDER — METOPROLOL TARTRATE 50 MG PO TABS: 100.0000 mg | ORAL_TABLET | Freq: Two times a day (BID) | ORAL | Status: DC

## 2021-06-06 MED ORDER — ZONISAMIDE 100 MG/5ML PO SUSP: ORAL | 1 refills | Status: DC

## 2021-06-06 MED ORDER — LEVETIRACETAM 100 MG/ML PO SOLN: 1500.0000 mg | Freq: Two times a day (BID) | ORAL | 12 refills | Status: DC

## 2021-06-06 MED ORDER — LEVETIRACETAM 100 MG/ML PO SOLN
1000.0000 mg | Freq: Two times a day (BID) | ORAL | Status: DC
  Administered 2021-06-06: 1000 mg via ORAL
  Filled 2021-06-06 (×2): qty 10

## 2021-06-06 NOTE — Progress Notes (Signed)
S: No further seizure activity overnight. Patient is feeling well and anxious to go home.  O:  Vitals:   06/06/21 1120 06/06/21 1241  BP:  (!) 145/95  Pulse: 67   Resp: 14   Temp: 98.8 F (37.1 C)   SpO2: 97%     Physical Exam Gen: A&Ox4, NAD HEENT: Atraumatic, normocephalic; oropharynx clear, tongue without atrophy or fasciculations. Resp: CTAB, normal work of breathing CV: RRR, extremities appear well-perfused. Abd: soft/NT/ND Extrem: Nml bulk; no cyanosis, clubbing, or edema.  Neuro: *MS: A&O x4. Follows multi-step commands.  *Speech: mild dysarthria, processing delay without frank aphasia, able to name and repeat. *CN:    I: Deferred   II,III: PERRLA, VFF by confrontation, optic discs not visualized 2/2 pupillary constriction   III,IV,VI: EOMI w/o nystagmus, no ptosis   V: Sensation intact from V1 to V3 to LT   VII: Eyelid closure was full.  Smile symmetric.   VIII: Hearing intact to voice   IX,X: Voice normal, palate elevates symmetrically    XI: SCM/trap 5/5 bilat   XII: Tongue protrudes midline, no atrophy or fasciculations  *Motor:   Normal bulk.  No tremor, rigidity or bradykinesia. No pronator drift.   Strength: Dlt Bic Tri WE WrF FgS Gr HF KnF KnE PlF DoF    Left 5 5 5 5 5 5 5 5 5 5 5 5     Right 5 5 5 5 5 5 5 5 5 5 5 5    *Sensory: Intact to light touch, pinprick, temperature vibration throughout. Symmetric. Propioception intact bilat.  No double-simultaneous extinction.  *Coordination:  Finger-to-nose, heel-to-shin, rapid alternating motions were intact. *Reflexes:  2+ and symmetric throughout without clonus; toes down-going bilat *Gait: deferred  A/P: 46 yo gentleman with hx TBI and seizures admitted overnight and seen by teleneuro. He had a breakthrough seizure yesterday AM, was seen in ED, sent home on keppra 1250mg  bid (from 900mg  bid liquid) and presented again with seizure last night. Given that he had 2 breakthrough seizures yesterday without clear  trigger and had a recent admission for status epilepticus requiring intubation, recommend starting a second agent in addition to keppra. He has tried and failed several AEDs 2/2 side effects: VPA (hyperammonemia), vimpat (eosinophilia), lamictal (rash).  - Continue keppra 1500mg  bid - please ensure patient is sent rx for liquid form at discharge - Start zonisamide 100mg  qhs x2 weeks then 200mg  qhs after that - F/u with established outpatient neurologist  Neurology to sign off, but please re-engage if additional questions arise.   Su Monks, MD [facility] [phone]  If 7pm- 7am, please page neurology on call as listed in [place].

## 2021-06-06 NOTE — Discharge Summary (Addendum)
Physician Discharge Summary   Patient: Luis Hunter MRN: [id number] DOB: [birth year]  Admit date:     06/05/2021  Discharge date: 06/06/21  Discharge Physician: Nita Sells   PCP: [facility]   Recommendations at discharge:   See medcation recommnedaitons as below-now Keppra 1500, Oxcarbazepine 150 bid Needs close follow-up for mood disorder as Keppra has caused him to be somewhat depressed and he will need adjustment of these meds in coordination with his psychiatrist and his neurologist  Discharge Diagnoses: Principal Problem:   Seizure [facility])  Resolved Problems:   * No resolved hospital problems. *   Hospital Course: 46 year old African-American male Bipolar, schizophrenia, seizure disorder [facility] on Keppra and Lamictal  Recent admission to [facility] from [facility] ED 12/22-12/24/23--- status epilepticus intubated on arrival extubated the next day 12/23 and was told to follow-up with neurology-noted at that admission multiple antipsychotics on med list with risk of polypharmacy-apparently only on Abilify injection  Represented to ED 1/31-apparently had not been compliant with lamotrigine because of a rash-he is supposed to be on Keppra 900 twice daily--he was discharged earlier in the day 1/31 but returned with a 10-minute tonic-clonic generalized seizure  On-call telemetry neurology recommended observation because of recurrent seizure  On arrival potassium 3.5 unremarkable other chemistries EKG normal CT head negative Dr. Quinn Axe of neurology was notified of patient's arrival   Patient remained seizure-free overnight and was evaluated thoroughly by both telemetry neurology and neurology and it was felt that patient should start on oxcarbazepine [ zonisamide not covered] and an increased dose of Keppra as dictated above and below in terms of the [facility]  Patient was discharged in a stable state seizure-free and after having  met maximal hospital benefit        Pain control - [facility] Controlled Substance Reporting System database was reviewed. and patient was instructed, not to drive, operate heavy machinery, perform activities at heights, swimming or participation in water activities or provide baby-sitting services while on Pain, Sleep and Anxiety Medications; until their outpatient Physician has advised to do so again. Also recommended to not to take more than prescribed Pain, Sleep and Anxiety Medications.   Consultants: neuro Procedures performed: n  Disposition: Home Diet recommendation:  Discharge Diet Orders (From admission, onward)     Start     Ordered   06/06/21 0000  Diet - low sodium heart healthy        06/06/21 1401           Cardiac diet  DISCHARGE MEDICATION: Allergies as of 06/06/2021       Reactions   Lacosamide    EOSINOPHILIA   Valproic Acid And Related    hyperammonemia   Lamictal [lamotrigine] Rash        Medication List     STOP taking these medications    acetaminophen 160 MG/5ML solution Commonly known as: TYLENOL   albuterol (2.5 MG/3ML) 0.083% nebulizer solution Commonly known as: PROVENTIL   amLODipine 10 MG tablet Commonly known as: NORVASC   arformoterol 15 MCG/2ML Nebu Commonly known as: BROVANA   benztropine 0.5 MG tablet Commonly known as: COGENTIN   budesonide 0.25 MG/2ML nebulizer solution Commonly known as: PULMICORT   clonazePAM 0.5 MG tablet Commonly known as: KLONOPIN   lamoTRIgine 25 MG tablet Commonly known as: LAMICTAL   lansoprazole 30 MG disintegrating tablet Commonly known as: PREVACID SOLUTAB   metoprolol tartrate 100 MG tablet Commonly known as: [employer]  mirtazapine 15 MG tablet Commonly known as: REMERON   scopolamine 1 MG/3DAYS Commonly known as: TRANSDERM-SCOP       TAKE these medications    ARIPiprazole ER 400 MG Prsy prefilled syringe Commonly known as: ABILIFY MAINTENA Inject 400 mg into the  muscle every 30 (thirty) days. Resume after discharge from hospital. Do not take concurrently with PO/VT Abilify.   levETIRAcetam 100 MG/ML solution Commonly known as: KEPPRA Place 15 mLs (1,500 mg total) into feeding tube 2 (two) times daily. What changed:  how much to take when to take this   Zonisamide 100 MG/5ML Susp Take 100 mg by mouth at bedtime for 14 days, THEN 200 mg at bedtime. Start taking on: June 06, 2021        Follow-up Information     Schedule an appointment as soon as possible for a visit  with [facility] [place].   Specialty: General Practice Contact information: [address] [place] [address] [phone]                 Discharge Exam: There were no vitals filed for this visit. Awake coherent alert slightly unintelligible speech but no drift no unilateral weakness of the lips Power 5/5 Sensory intact Reflexes equivocal at biceps and knees Chest clear no added sound no rales neuro no rhonchi Quite asthenic and malnourished Chest clear  Condition at discharge: fair  The results of significant diagnostics from this hospitalization (including imaging, microbiology, ancillary and laboratory) are listed below for reference.   Imaging Studies: CT HEAD WO CONTRAST (5MM)  Result Date: 06/05/2021 CLINICAL DATA:  Mental status change, unknown cause.  Seizures EXAM: CT HEAD WITHOUT CONTRAST TECHNIQUE: Contiguous axial images were obtained from the base of the skull through the vertex without intravenous contrast. RADIATION DOSE REDUCTION: This exam was performed according to the departmental dose-optimization program which includes automated exposure control, adjustment of the mA and/or kV according to patient size and/or use of iterative reconstruction technique. COMPARISON:  None. FINDINGS: Brain: No evidence of large-territorial acute infarction. No parenchymal hemorrhage. No mass lesion. No extra-axial  collection. No mass effect or midline shift. No hydrocephalus. Basilar cisterns are patent. Vascular: No hyperdense vessel. Skull: No acute fracture or focal lesion. Sinuses/Orbits: Paranasal sinuses and mastoid air cells are clear. The orbits are unremarkable. Other: None. IMPRESSION: No acute intracranial abnormality. Electronically Signed   By: Iven Finn M.D.   On: 06/05/2021 17:42    Microbiology: Results for orders placed or performed during the hospital encounter of 06/05/21  Resp Panel by RT-PCR (Flu A&B, Covid) Nasopharyngeal Swab     Status: None   Collection Time: 06/05/21  9:59 PM   Specimen: Nasopharyngeal Swab; Nasopharyngeal(NP) swabs in vial transport medium  Result Value Ref Range Status   SARS Coronavirus 2 by RT PCR NEGATIVE NEGATIVE Final    Comment: (NOTE) SARS-CoV-2 target nucleic acids are NOT DETECTED.  The SARS-CoV-2 RNA is generally detectable in upper respiratory specimens during the acute phase of infection. The lowest concentration of SARS-CoV-2 viral copies this assay can detect is 138 copies/mL. A negative result does not preclude SARS-Cov-2 infection and should not be used as the sole basis for treatment or other patient management decisions. A negative result may occur with  improper specimen collection/handling, submission of specimen other than nasopharyngeal swab, presence of viral mutation(s) within the areas targeted by this assay, and inadequate number of viral copies(<138 copies/mL). A negative result must be combined with clinical observations,  patient history, and epidemiological information. The expected result is Negative.  Fact Sheet for Patients:  EntrepreneurPulse.com.au  Fact Sheet for Healthcare Providers:  IncredibleEmployment.be  This test is no t yet approved or cleared by the [place] FDA and  has been authorized for detection and/or diagnosis of SARS-CoV-2 by FDA under an Emergency Use  Authorization (EUA). This EUA will remain  in effect (meaning this test can be used) for the duration of the COVID-19 declaration under Section 564(b)(1) of the Act, 21 U.S.C.section 360bbb-3(b)(1), unless the authorization is terminated  or revoked sooner.       Influenza A by PCR NEGATIVE NEGATIVE Final   Influenza B by PCR NEGATIVE NEGATIVE Final    Comment: (NOTE) The Xpert Xpress SARS-CoV-2/FLU/RSV plus assay is intended as an aid in the diagnosis of influenza from Nasopharyngeal swab specimens and should not be used as a sole basis for treatment. Nasal washings and aspirates are unacceptable for Xpert Xpress SARS-CoV-2/FLU/RSV testing.  Fact Sheet for Patients: EntrepreneurPulse.com.au  Fact Sheet for Healthcare Providers: IncredibleEmployment.be  This test is not yet approved or cleared by the [place] FDA and has been authorized for detection and/or diagnosis of SARS-CoV-2 by FDA under an Emergency Use Authorization (EUA). This EUA will remain in effect (meaning this test can be used) for the duration of the COVID-19 declaration under Section 564(b)(1) of the Act, 21 U.S.C. section 360bbb-3(b)(1), unless the authorization is terminated or revoked.  Performed at [facility], [address] [place], [place] [address]     Labs: CBC: Recent Labs  Lab 06/05/21 0757 06/06/21 0845  WBC 7.3 11.7*  NEUTROABS 5.6 10.0*  HGB 13.3 13.2  HCT 41.6 41.4  MCV 88.5 88.5  PLT 189 017   Basic Metabolic Panel: Recent Labs  Lab 06/05/21 0757 06/05/21 1724 06/06/21 0845  NA 135 136 137  K 3.3* 3.5 3.4*  CL 100 100 106  CO2 _0 GLUCOSE 111* 94 212*  BUN _1 CREATININE 0.83 0.94 0.92  CALCIUM 9.1 9.3 8.7*  MG 1.8  --   --    Liver Function Tests: Recent Labs  Lab 06/05/21 0757 06/06/21 0845  AST 29 21  ALT 12 12  ALKPHOS 63 58  BILITOT 0.7 0.9  PROT 7.2 6.4*  ALBUMIN 3.9 3.5   CBG: No results  for input(s): GLUCAP in the last 168 hours.  Discharge time spent: greater than 30 minutes.  Signed: Nita Sells, MD [facility] 06/06/2021

## 2021-06-06 NOTE — Hospital Course (Addendum)
46 year old African-American male Bipolar, schizophrenia, seizure disorder followed [facility] on Keppra and Lamictal  Recent admission to [facility] from [facility] ED 12/22-12/24/23--- status epilepticus intubated on arrival extubated the next day 12/23 and was told to follow-up with neurology-noted at that admission multiple antipsychotics on med list with risk of polypharmacy-apparently only on Abilify injection  Represented to ED 1/31-apparently had not been compliant with her lamotrigine because of a rash-he is supposed to be on Keppra 900 twice daily--he was discharged earlier in the day 1/31 but returned with a 10-minute tonic-clonic generalized seizure  On-call telemetry neurology recommended observation because of recurrent seizure  On arrival potassium 3.5 unremarkable other chemistries EKG normal CT head negative Dr. Quinn Axe of neurology was notified of patient's arrival   Patient remained seizure-free overnight and was evaluated thoroughly by both telemetry neurology and neurology and it was felt that patient should start on zonisamide and an increased dose of Keppra as dictated above and below in terms of the [facility]  Patient was discharged in a stable state seizure-free and after having met maximal hospital benefit

## 2021-06-30 ENCOUNTER — Emergency Department: Payer: Medicare HMO

## 2021-06-30 ENCOUNTER — Emergency Department
Admission: EM | Admit: 2021-06-30 | Discharge: 2021-06-30 | Disposition: A | Discharge status: Home / Self Care | Payer: Medicare HMO | Attending: Student in an Organized Health Care Education/Training Program | Admitting: Student in an Organized Health Care Education/Training Program

## 2021-06-30 DIAGNOSIS — G40909 Epilepsy, unspecified, not intractable, without status epilepticus: Secondary | ICD-10-CM | POA: Insufficient documentation

## 2021-06-30 DIAGNOSIS — Z79899 Other long term (current) drug therapy: Secondary | ICD-10-CM | POA: Insufficient documentation

## 2021-06-30 DIAGNOSIS — R569 Unspecified convulsions: Secondary | ICD-10-CM

## 2021-06-30 LAB — COMPREHENSIVE METABOLIC PANEL
ALT: 16 U/L (ref 0–44)
AST: 27 U/L (ref 15–41)
Albumin: 3.9 g/dL (ref 3.5–5.0)
Alkaline Phosphatase: 82 U/L (ref 38–126)
Anion gap: 21 — ABNORMAL HIGH (ref 5–15)
BUN: 11 mg/dL (ref 6–20)
CO2: 14 mmol/L — ABNORMAL LOW (ref 22–32)
Calcium: 8.9 mg/dL (ref 8.9–10.3)
Chloride: 102 mmol/L (ref 98–111)
Creatinine, Ser: 0.92 mg/dL (ref 0.61–1.24)
GFR, Estimated: >60 mL/min (ref >60)
Glucose, Bld: 121 mg/dL — ABNORMAL HIGH (ref 70–99)
Potassium: 3.4 mmol/L — ABNORMAL LOW (ref 3.5–5.1)
Sodium: 137 mmol/L (ref 135–145)
Total Bilirubin: 0.3 mg/dL (ref 0.3–1.2)
Total Protein: 7.4 g/dL (ref 6.5–8.1)

## 2021-06-30 LAB — CBC
HCT: 44.6 % (ref 39.0–52.0)
Hemoglobin: 13.8 g/dL (ref 13.0–17.0)
MCH: 27.5 pg (ref 26.0–34.0)
MCHC: 30.9 g/dL (ref 30.0–36.0)
MCV: 89 fL (ref 80.0–100.0)
Platelets: 210 10*3/uL (ref 150–400)
RBC: 5.01 MIL/uL (ref 4.22–5.81)
RDW: 15.5 % (ref 11.5–15.5)
WBC: 8.1 10*3/uL (ref 4.0–10.5)
nRBC: 0 % (ref 0.0–0.2)

## 2021-06-30 LAB — URINE DRUG SCREEN, QUALITATIVE (ARMC ONLY)
Amphetamines, Ur Screen: NOT DETECTED
Barbiturates, Ur Screen: NOT DETECTED
Benzodiazepine, Ur Scrn: POSITIVE — AB
Cannabinoid 50 Ng, Ur ~~LOC~~: POSITIVE — AB
Cocaine Metabolite,Ur ~~LOC~~: POSITIVE — AB
MDMA (Ecstasy)Ur Screen: NOT DETECTED
Methadone Scn, Ur: NOT DETECTED
Opiate, Ur Screen: NOT DETECTED
Phencyclidine (PCP) Ur S: NOT DETECTED
Tricyclic, Ur Screen: NOT DETECTED

## 2021-06-30 IMAGING — CT CT HEAD W/O CM
4 series · 17 of 47 positions shown, 19 images · non-contrast
Comparison: Recent prior CT scan of the head [DATE]

CLINICAL DATA: Mental status changes



[Series 2: head wo · axial · 0.47mm/px · z∈[-105,+15]mm · 7 of 33 slices shown, 9 images]
[im 5/33  brain]
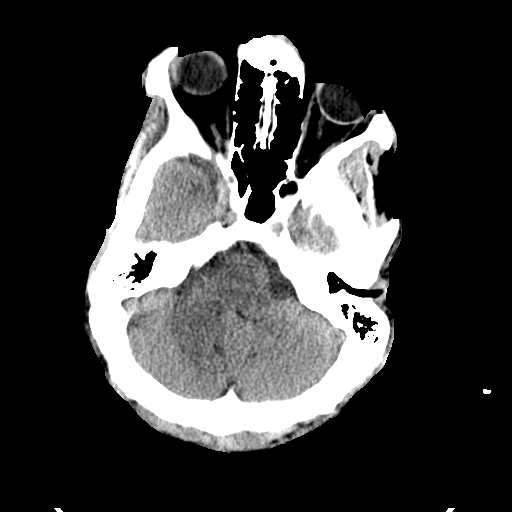
[im 5/33  bone]
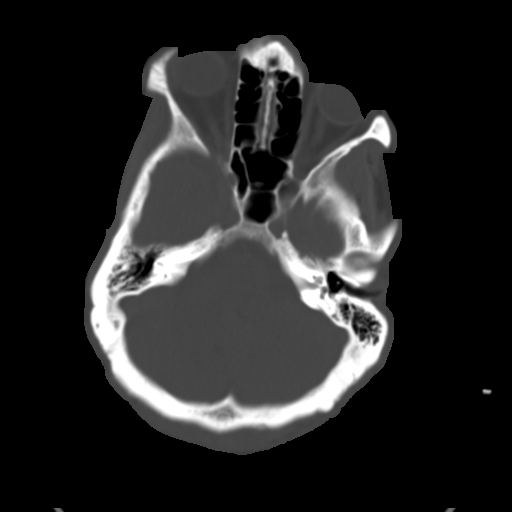
[im 9/33  brain]
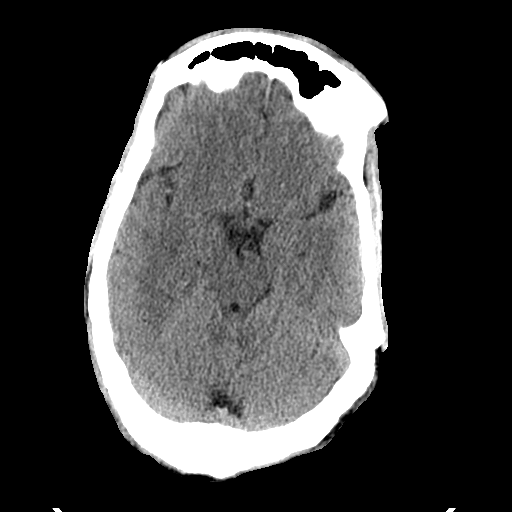
[im 13/33  brain]
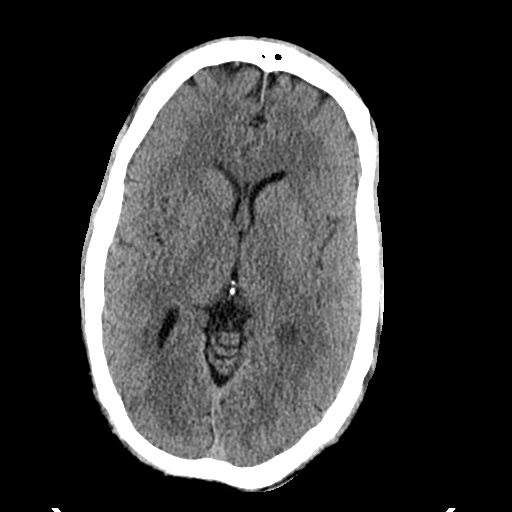
[im 17/33  brain]
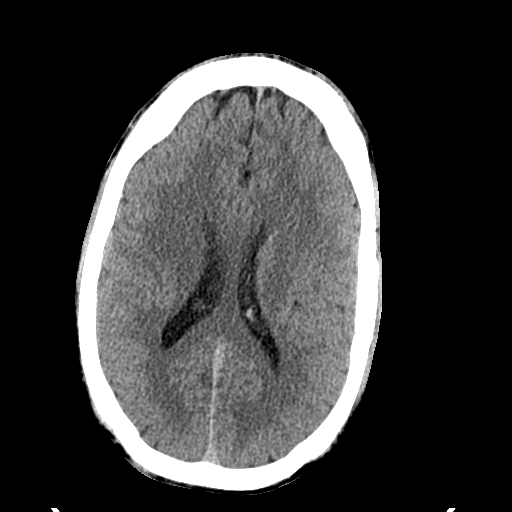
[im 21/33  brain]
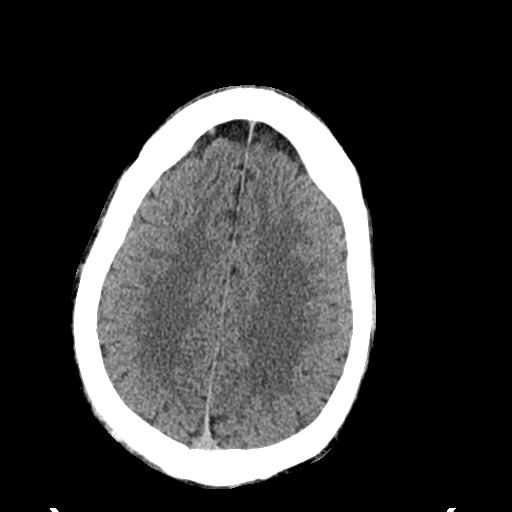
[im 21/33  bone]
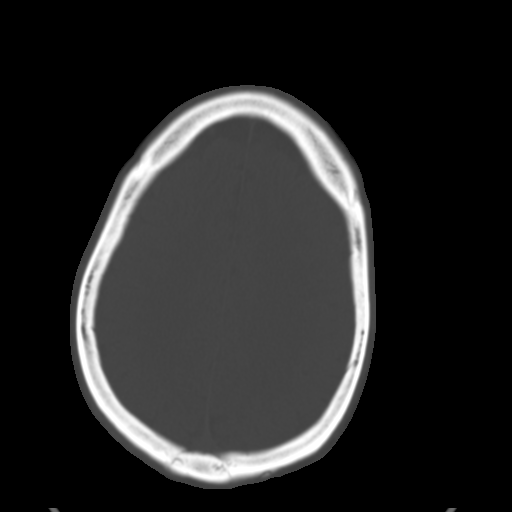
[im 25/33  brain]
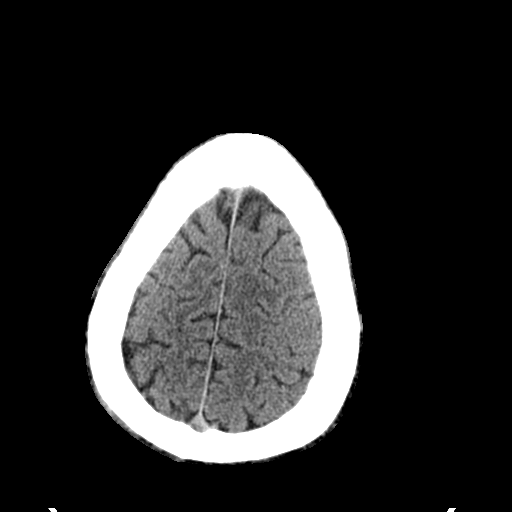
[im 29/33  brain]
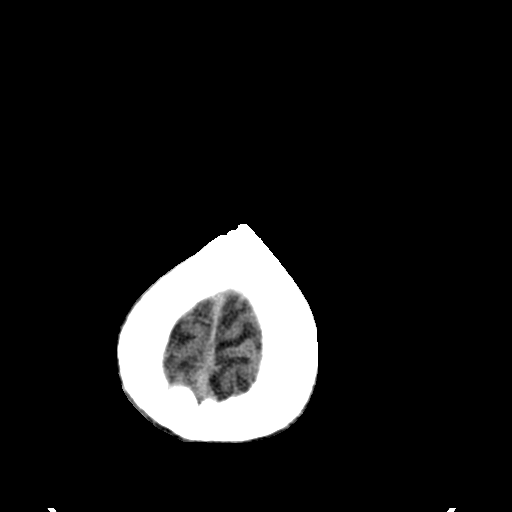

[Series 3: head bone · axial · 0.47mm/px · z∈[-109,-53]mm · 4 of 81 slices shown]
[im 9/81  bone]
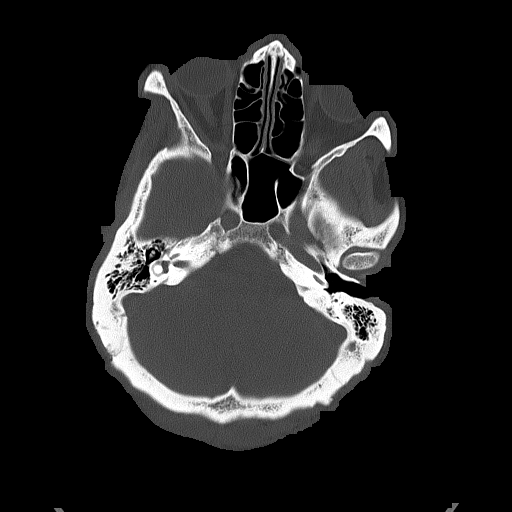
[im 17/81  bone]
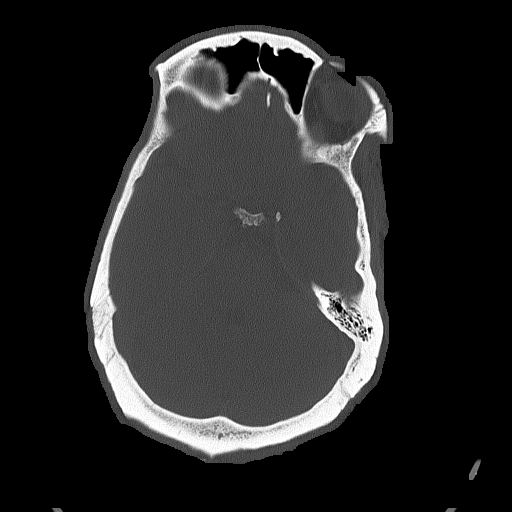
[im 25/81  bone]
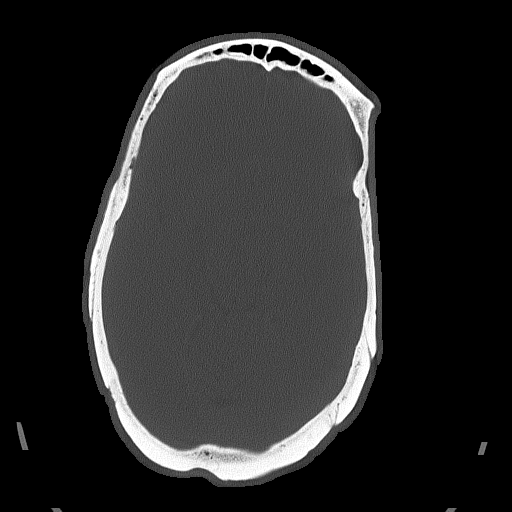
[im 37/81  bone]
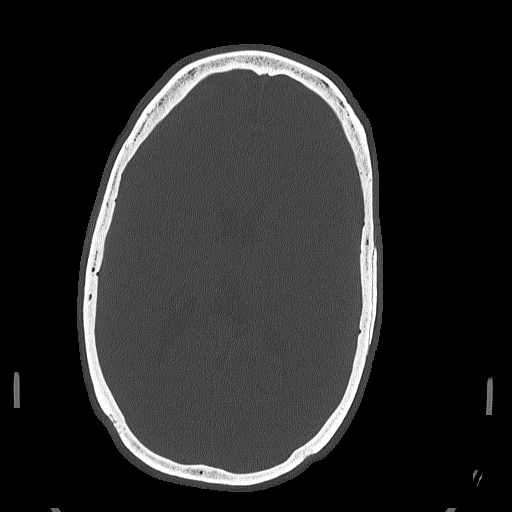

[Series 4: sagittal soft tissue · sagittal · 0.37mm/px · 3 of 59 slices shown]
[im 20/59  brain]
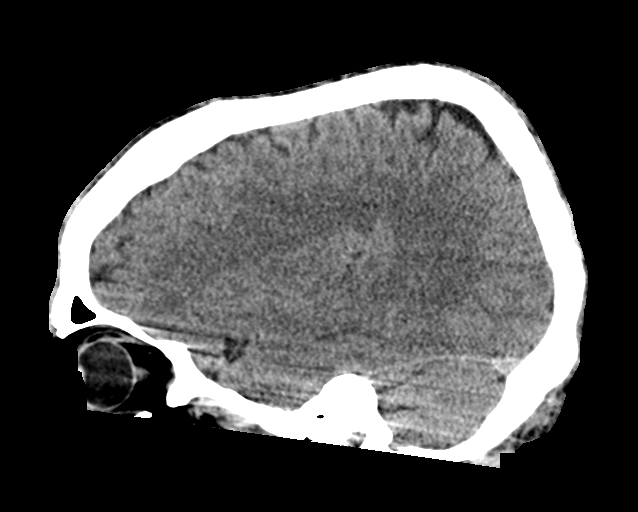
[im 30/59  brain]
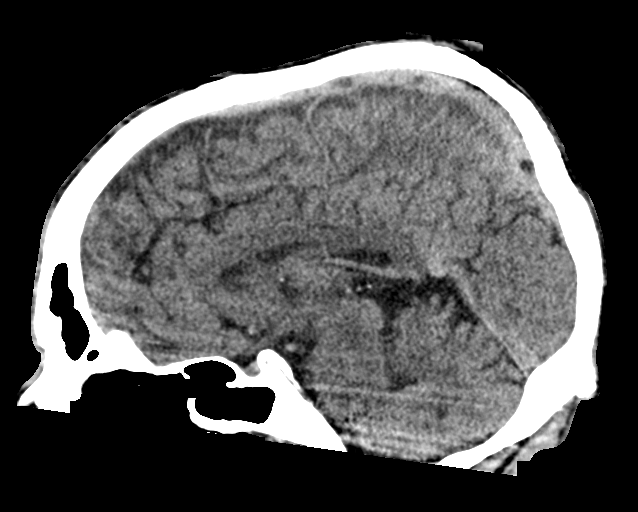
[im 39/59  brain]
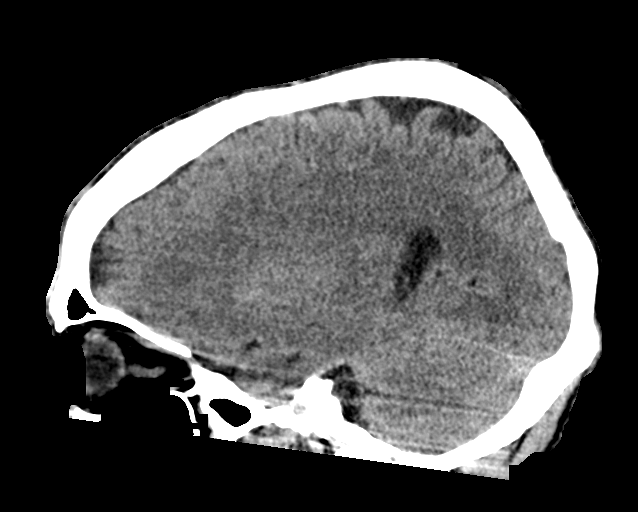

[Series 5: coronal soft tissue · coronal · 0.35mm/px · 3 of 79 slices shown]
[im 27/79  brain]
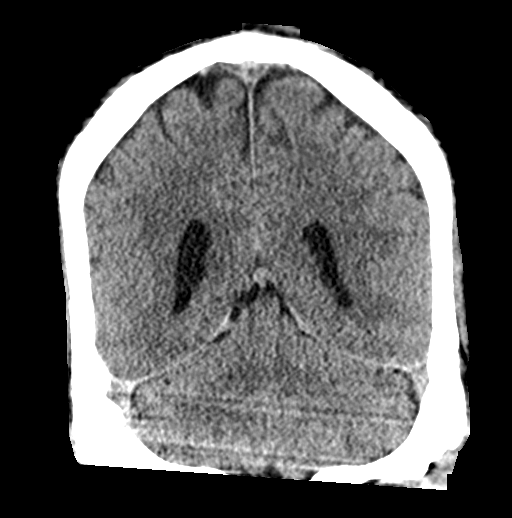
[im 35/79  brain]
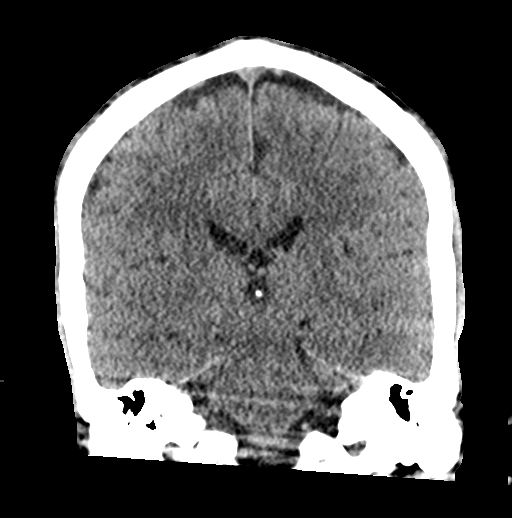
[im 44/79  brain]
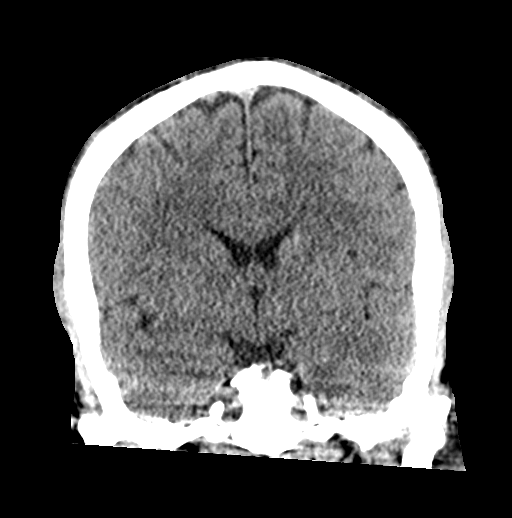

[17 of 47 positions shown; findings below may reference images not displayed]

FINDINGS: Brain: No evidence of acute infarction, hemorrhage, hydrocephalus,
extra-axial collection or mass lesion/mass effect.

Vascular: No hyperdense vessel or unexpected calcification.

Skull: Normal. Negative for fracture or focal lesion.

Sinuses/Orbits: No acute finding.

Other: None.
IMPRESSION: Negative head CT.

## 2021-06-30 IMAGING — DX DG CHEST 1V PORT
1 series · 1 of 1 positions shown · non-contrast
Comparison: [DATE]

CLINICAL DATA: Seizure, evaluate for aspiration

EXAM:
PORTABLE CHEST 1 VIEW

[chest ap]
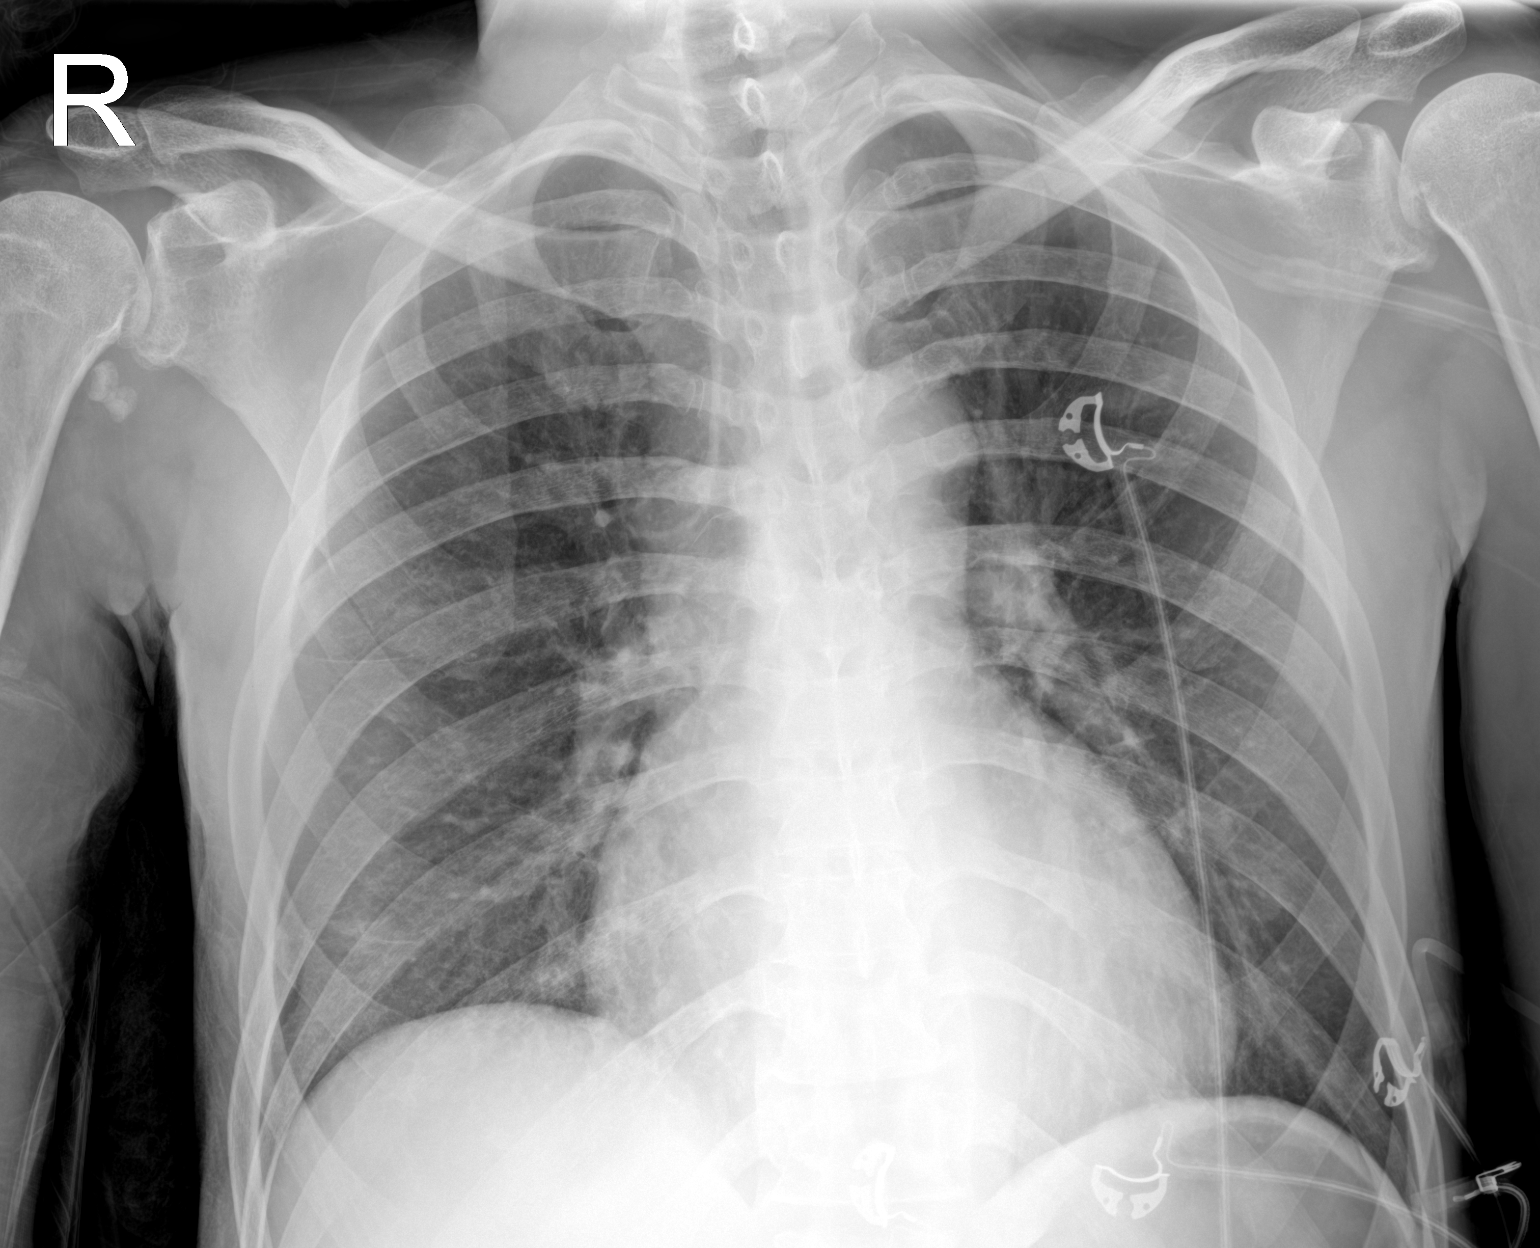

[1 of 1 positions shown; findings below may reference images not displayed]

FINDINGS: Borderline cardiomegaly. Stable prominent lung markings. There is no
edema, consolidation, effusion, or pneumothorax. No effusion or
pneumothorax. Articular bodies at the right lower glenohumeral
joint.
IMPRESSION: Baseline appearance of the chest.  No acute finding.

## 2021-06-30 MED ORDER — ACETAMINOPHEN 325 MG PO TABS
650.0000 mg | ORAL_TABLET | Freq: Once | ORAL | Status: AC
  Administered 2021-06-30: 650 mg via ORAL
  Filled 2021-06-30: qty 2

## 2021-06-30 MED ORDER — ONDANSETRON HCL 4 MG/2ML IJ SOLN
4.0000 mg | Freq: Once | INTRAMUSCULAR | Status: AC
  Administered 2021-06-30: 4 mg via INTRAVENOUS
  Filled 2021-06-30: qty 2

## 2021-06-30 MED ORDER — OXCARBAZEPINE 300 MG/5ML PO SUSP
150.0000 mg | Freq: Two times a day (BID) | ORAL | Status: DC
  Administered 2021-06-30: 150 mg via ORAL
  Filled 2021-06-30 (×2): qty 2.5

## 2021-06-30 MED ORDER — SODIUM CHLORIDE 0.9 % IV BOLUS
500.0000 mL | Freq: Once | INTRAVENOUS | Status: AC
Start: 2021-06-30 — End: 2021-06-30
  Administered 2021-06-30: 500 mL via INTRAVENOUS

## 2021-06-30 MED ORDER — PANTOPRAZOLE SODIUM 40 MG IV SOLR
40.0000 mg | Freq: Once | INTRAVENOUS | Status: AC
  Administered 2021-06-30: 40 mg via INTRAVENOUS
  Filled 2021-06-30: qty 10

## 2021-06-30 MED ORDER — LEVETIRACETAM IN NACL 1500 MG/100ML IV SOLN
1500.0000 mg | Freq: Once | INTRAVENOUS | Status: AC
  Administered 2021-06-30: 1500 mg via INTRAVENOUS
  Filled 2021-06-30: qty 100

## 2021-06-30 NOTE — ED Notes (Signed)
Seizure precautions in place. Suction at bedside and verified, pt on all monitoring equipment, call bell within reach, bed in lowest position, side rails up, seizure pads in place, pt in full view of RN station.

## 2021-06-30 NOTE — ED Notes (Signed)
Patient to CT scan at this time

## 2021-06-30 NOTE — ED Triage Notes (Signed)
Pt appears postictal at this time. Rhonchi type breathing noted at this time. Medic reports pt has sustained said breath sounds for duration they have been there.

## 2021-06-30 NOTE — ED Triage Notes (Signed)
From home via [facility]. BGL 175, 131/76,  Roommate called EMS d/t hearing pt have seizure. Medic reports they witnessed a seizure happen en route around approximately 0806, 4mg  of Versed given IV after witnessed seizure.

## 2021-06-30 NOTE — ED Provider Notes (Addendum)
[facility] Provider Note    Event Date/Time   First MD Initiated Contact with Patient 06/30/21 0815     (approximate)   History   Seizures  Level V Caveat:  Post ictal  HPI  Luis Hunter is a 46 y.o. male with a history of depression and schizophrenia, seizure disorder prior substance abuse presents from group home after witnessed seizure activity that occurred just after 8:00.  Patient was given 4 of Versed with EMS.  He arrives postictal.  CBG was within normal limits.  No additional history provided by patient as he is currently postictal.  EMS does not report any history of trauma.  Patient was admitted for seizures at the end of last month.     Physical Exam   Triage Vital Signs: ED Triage Vitals  Enc Vitals Group     BP 06/30/21 0819 135/84     Pulse Rate 06/30/21 0819 97     Resp 06/30/21 0819 (!) 22     Temp 06/30/21 0821 97.8 F (36.6 C)     Temp Source 06/30/21 0821 Axillary     SpO2 06/30/21 0819 95 %     Weight 06/30/21 0819 160 lb 15 oz (73 kg)     Height 06/30/21 0819 5\' 11"  (1.803 m)     Head Circumference --      Peak Flow --      Pain Score --      Pain Loc --      Pain Edu? --      Excl. in [place]? --     Most recent vital signs: Vitals:   06/30/21 1130 06/30/21 1330  BP: 136/86 (!) 154/97  Pulse: 86 72  Resp: 18 18  Temp:    SpO2: 95% 95%     Constitutional: postictal Eyes: Conjunctivae are normal.  Head: Atraumatic. Nose: No congestion/rhinnorhea. Mouth/Throat: Mucous membranes are moist.   Neck: Painless ROM.  Cardiovascular:   Good peripheral circulation. Respiratory: Normal respiratory effort.  No retractions.  Gastrointestinal: Soft and nontender.  Musculoskeletal:  no deformity Neurologic:  MAE spontaneously. No gross focal neurologic deficits are appreciated.  Skin:  Skin is warm, dry and intact. No rash noted. Psychiatric: Mood and affect are normal. Speech and behavior are normal.    ED Results /  Procedures / Treatments   Labs (all labs ordered are listed, but only abnormal results are displayed) Labs Reviewed  URINE DRUG SCREEN, QUALITATIVE ([facility] ONLY) - Abnormal; Notable for the following components:      Result Value   Cocaine Metabolite,Ur Riddle POSITIVE (*)    Cannabinoid 50 Ng, Ur Glide POSITIVE (*)    Benzodiazepine, Ur Scrn POSITIVE (*)    All other components within normal limits  COMPREHENSIVE METABOLIC PANEL - Abnormal; Notable for the following components:   Potassium 3.4 (*)    CO2 14 (*)    Glucose, Bld 121 (*)    Anion gap 21 (*)    All other components within normal limits  CBC     EKG  ED ECG REPORT I, Merlyn Lot, the attending physician, personally viewed and interpreted this ECG.   Date: 06/30/2021  EKG Time: 8:37  Rate: 90  Rhythm: sinus  Axis: norma  Intervals: normal intervals  ST&T Change: lvh, ber, no stemi or depression   RADIOLOGY Please see ED Course for my review and interpretation.  I personally reviewed all radiographic images ordered to evaluate for the above acute complaints and  reviewed radiology reports and findings.  These findings were personally discussed with the patient.  Please see medical record for radiology report.    PROCEDURES:  Critical Care performed:   Procedures   MEDICATIONS ORDERED IN ED: Medications  OXcarbazepine (TRILEPTAL) 300 MG/5ML suspension 150 mg (150 mg Oral Given 06/30/21 0941)  pantoprazole (PROTONIX) injection 40 mg (has no administration in time range)  levETIRAcetam (KEPPRA) IVPB 1500 mg/ 100 mL premix (0 mg Intravenous Stopped 06/30/21 0848)  ondansetron (ZOFRAN) injection 4 mg (4 mg Intravenous Given 06/30/21 0919)  acetaminophen (TYLENOL) tablet 650 mg (650 mg Oral Given 06/30/21 1305)  ondansetron (ZOFRAN) injection 4 mg (4 mg Intravenous Given 06/30/21 1304)  sodium chloride 0.9 % bolus 500 mL (500 mLs Intravenous New Bag/Given 06/30/21 1304)     IMPRESSION / MDM / ASSESSMENT AND PLAN  / ED COURSE  I reviewed the triage vital signs and the nursing notes.                              Differential diagnosis includes, but is not limited to, seizure, noncompliance, substance abuse, electrolyte abnormality, dysrhythmia  Patient presenting to the ER for evaluation of seizure episode.  Patient appears postictal on arrival.  Hemodynamically stable.  Has history of seizure disorder also substance abuse given no reported trauma do not feel that repeat CT imaging clinically indicated will give Keppra antiepileptic medications check blood work and reassess.,  Clinical Course as of 06/30/21 1441  Sat Jun 30, 2021  0903 Patient reassessed.  Now more alert answering questions appropriately.  Denies missing any medications denies any pain.  We will continue observe.  This not consistent with status. [PR]  1139 Patient reassessed following commands no acute distress.  UDS is positive for multiple substances.  This not consistent with status epilepticus.  Blood work otherwise reassuring.  Will p.o. challenge and ambulate and if able to pass deeply he is appropriate for outpatient follow-up. [PR]  1223 Patient was able to tolerate PO and was able to ambulate with a steady gait.  [PR]  1332 Patient is here complaining of nausea had episode will give Zofran will order CT head to exclude acute intracranial abnormality given his cocaine use. [PR]  1440 CT head without acute abnormality.  Patient complaining of some epigastric discomfort.  His abdominal exam is soft and benign.  I suspect he is got some gastritis likely substance related.  Will give Protonix.  Patient currently tolerating p.o.  I do believe he stable and appropriate for outpatient follow-up. [PR]    Clinical Course User Index [PR] Merlyn Lot, MD     FINAL CLINICAL IMPRESSION(S) / ED DIAGNOSES   Final diagnoses:  Seizure ([place]rian)     Rx / DC Orders   ED Discharge Orders     None        Note:  This document was  prepared using Dragon voice recognition software and may include unintentional dictation errors.     Merlyn Lot, MD 06/30/21 [id number]

## 2021-06-30 NOTE — ED Notes (Signed)
Patient talking on phone at this time. NAD noted.

## 2021-06-30 NOTE — ED Notes (Signed)
Patient ambulatory in room without assistance.

## 2021-06-30 NOTE — ED Notes (Signed)
Patient given sprite and sandwich tray 

## 2021-06-30 NOTE — Discharge Instructions (Signed)
Make sure to take your medications.  Do not take other drugs (marijuana, cocaine, etc.) as this will cause you to have seizures.

## 2021-08-10 ENCOUNTER — Emergency Department: Payer: Medicare HMO

## 2021-08-10 ENCOUNTER — Other Ambulatory Visit: Payer: Self-pay

## 2021-08-10 ENCOUNTER — Inpatient Hospital Stay (HOSPITAL_COMMUNITY): Payer: Medicare HMO

## 2021-08-10 ENCOUNTER — Inpatient Hospital Stay (HOSPITAL_COMMUNITY)
Admission: AD | Admit: 2021-08-10 | Discharge: 2021-08-14 | DRG: 101 | Disposition: A | Discharge status: Home / Self Care | Payer: Medicare HMO | Source: Other Acute Inpatient Hospital | Attending: Family Medicine | Admitting: Family Medicine

## 2021-08-10 ENCOUNTER — Emergency Department
Admission: EM | Admit: 2021-08-10 | Discharge: 2021-08-10 | Disposition: A | Discharge status: Other Acute Inpatient Hospital | Payer: Medicare HMO | Attending: Emergency Medicine | Admitting: Emergency Medicine

## 2021-08-10 DIAGNOSIS — I429 Cardiomyopathy, unspecified: Secondary | ICD-10-CM | POA: Diagnosis present

## 2021-08-10 DIAGNOSIS — Z79899 Other long term (current) drug therapy: Secondary | ICD-10-CM | POA: Diagnosis not present

## 2021-08-10 DIAGNOSIS — F259 Schizoaffective disorder, unspecified: Secondary | ICD-10-CM | POA: Diagnosis present

## 2021-08-10 DIAGNOSIS — R451 Restlessness and agitation: Secondary | ICD-10-CM | POA: Diagnosis present

## 2021-08-10 DIAGNOSIS — Z9911 Dependence on respirator [ventilator] status: Secondary | ICD-10-CM | POA: Insufficient documentation

## 2021-08-10 DIAGNOSIS — R7881 Bacteremia: Secondary | ICD-10-CM | POA: Diagnosis present

## 2021-08-10 DIAGNOSIS — G40901 Epilepsy, unspecified, not intractable, with status epilepticus: Secondary | ICD-10-CM | POA: Insufficient documentation

## 2021-08-10 DIAGNOSIS — F419 Anxiety disorder, unspecified: Secondary | ICD-10-CM | POA: Diagnosis present

## 2021-08-10 DIAGNOSIS — F1290 Cannabis use, unspecified, uncomplicated: Secondary | ICD-10-CM | POA: Diagnosis present

## 2021-08-10 DIAGNOSIS — R911 Solitary pulmonary nodule: Secondary | ICD-10-CM | POA: Diagnosis present

## 2021-08-10 DIAGNOSIS — F1490 Cocaine use, unspecified, uncomplicated: Secondary | ICD-10-CM | POA: Diagnosis present

## 2021-08-10 DIAGNOSIS — B9620 Unspecified Escherichia coli [E. coli] as the cause of diseases classified elsewhere: Secondary | ICD-10-CM | POA: Diagnosis present

## 2021-08-10 DIAGNOSIS — Z20822 Contact with and (suspected) exposure to covid-19: Secondary | ICD-10-CM | POA: Diagnosis present

## 2021-08-10 DIAGNOSIS — R569 Unspecified convulsions: Secondary | ICD-10-CM | POA: Insufficient documentation

## 2021-08-10 DIAGNOSIS — Z91198 Patient's noncompliance with other medical treatment and regimen for other reason: Secondary | ICD-10-CM

## 2021-08-10 DIAGNOSIS — Z91148 Patient's other noncompliance with medication regimen for other reason: Secondary | ICD-10-CM | POA: Diagnosis not present

## 2021-08-10 DIAGNOSIS — J9601 Acute respiratory failure with hypoxia: Secondary | ICD-10-CM | POA: Diagnosis not present

## 2021-08-10 DIAGNOSIS — Z1612 Extended spectrum beta lactamase (ESBL) resistance: Secondary | ICD-10-CM | POA: Diagnosis not present

## 2021-08-10 DIAGNOSIS — A498 Other bacterial infections of unspecified site: Secondary | ICD-10-CM | POA: Diagnosis not present

## 2021-08-10 LAB — POCT I-STAT 7, (LYTES, BLD GAS, ICA,H+H)
Acid-Base Excess: 1 mmol/L (ref 0.0–2.0)
Bicarbonate: 24.9 mmol/L (ref 20.0–28.0)
Calcium, Ion: 1.26 mmol/L (ref 1.15–1.40)
HCT: 40 % (ref 39.0–52.0)
Hemoglobin: 13.6 g/dL (ref 13.0–17.0)
O2 Saturation: 98 %
Patient temperature: 98.1
Potassium: 3.7 mmol/L (ref 3.5–5.1)
Sodium: 140 mmol/L (ref 135–145)
TCO2: 26 mmol/L (ref 22–32)
pCO2 arterial: 37.1 mmHg (ref 32–48)
pH, Arterial: 7.434 (ref 7.35–7.45)
pO2, Arterial: 99 mmHg (ref 83–108)

## 2021-08-10 LAB — ETHANOL: Alcohol, Ethyl (B): <10 mg/dL (ref <10)

## 2021-08-10 LAB — BASIC METABOLIC PANEL
Anion gap: 19 — ABNORMAL HIGH (ref 5–15)
BUN: 12 mg/dL (ref 6–20)
CO2: 17 mmol/L — ABNORMAL LOW (ref 22–32)
Calcium: 9.3 mg/dL (ref 8.9–10.3)
Chloride: 105 mmol/L (ref 98–111)
Creatinine, Ser: 1.05 mg/dL (ref 0.61–1.24)
GFR, Estimated: 53 mL/min — ABNORMAL LOW (ref >60)
Glucose, Bld: 96 mg/dL (ref 70–99)
Potassium: 3.6 mmol/L (ref 3.5–5.1)
Sodium: 141 mmol/L (ref 135–145)

## 2021-08-10 LAB — BLOOD GAS, VENOUS
Acid-base deficit: 0.7 mmol/L (ref 0.0–2.0)
Bicarbonate: 26.6 mmol/L (ref 20.0–28.0)
MECHVT: 500 mL
O2 Saturation: 34.7 %
PEEP: 5 cmH2O
Patient temperature: 37
RATE: 18 resp/min
pCO2, Ven: 54 mmHg (ref 44–60)
pH, Ven: 7.3 (ref 7.25–7.43)
pO2, Ven: <31 mmHg — CL (ref 32–45)

## 2021-08-10 LAB — HEPATIC FUNCTION PANEL
ALT: 13 U/L (ref 0–44)
AST: 34 U/L (ref 15–41)
Albumin: 4 g/dL (ref 3.5–5.0)
Alkaline Phosphatase: 71 U/L (ref 38–126)
Bilirubin, Direct: <0.1 mg/dL (ref 0.0–0.2)
Total Bilirubin: 0.6 mg/dL (ref 0.3–1.2)
Total Protein: 7.1 g/dL (ref 6.5–8.1)

## 2021-08-10 LAB — URINE DRUG SCREEN, QUALITATIVE (ARMC ONLY)
Amphetamines, Ur Screen: NOT DETECTED
Barbiturates, Ur Screen: NOT DETECTED
Benzodiazepine, Ur Scrn: POSITIVE — AB
Cannabinoid 50 Ng, Ur ~~LOC~~: POSITIVE — AB
Cocaine Metabolite,Ur ~~LOC~~: POSITIVE — AB
MDMA (Ecstasy)Ur Screen: NOT DETECTED
Methadone Scn, Ur: NOT DETECTED
Opiate, Ur Screen: NOT DETECTED
Phencyclidine (PCP) Ur S: NOT DETECTED
Tricyclic, Ur Screen: NOT DETECTED

## 2021-08-10 LAB — URINALYSIS, COMPLETE (UACMP) WITH MICROSCOPIC
Bacteria, UA: NONE SEEN
Bilirubin Urine: NEGATIVE
Glucose, UA: NEGATIVE mg/dL
Hgb urine dipstick: NEGATIVE
Ketones, ur: NEGATIVE mg/dL
Leukocytes,Ua: NEGATIVE
Nitrite: NEGATIVE
Protein, ur: 30 mg/dL — AB
Specific Gravity, Urine: 1.02 (ref 1.005–1.030)
Squamous Epithelial / HPF: NONE SEEN (ref 0–5)
pH: 5 (ref 5.0–8.0)

## 2021-08-10 LAB — CBC
HCT: 43.8 % (ref 39.0–52.0)
Hemoglobin: 14 g/dL (ref 13.0–17.0)
MCH: 27.8 pg (ref 26.0–34.0)
MCHC: 32 g/dL (ref 30.0–36.0)
MCV: 87.1 fL (ref 80.0–100.0)
Platelets: 183 10*3/uL (ref 150–400)
RBC: 5.03 MIL/uL (ref 4.22–5.81)
RDW: 17.2 % — ABNORMAL HIGH (ref 11.5–15.5)
WBC: 8 10*3/uL (ref 4.0–10.5)
nRBC: 0 % (ref 0.0–0.2)

## 2021-08-10 LAB — APTT: aPTT: 27 seconds (ref 24–36)

## 2021-08-10 LAB — PROTIME-INR
INR: 1 (ref 0.8–1.2)
Prothrombin Time: 13.5 seconds (ref 11.4–15.2)

## 2021-08-10 LAB — CBG MONITORING, ED: Glucose-Capillary: 87 mg/dL (ref 70–99)

## 2021-08-10 LAB — RESP PANEL BY RT-PCR (FLU A&B, COVID) ARPGX2
Influenza A by PCR: NEGATIVE
Influenza B by PCR: NEGATIVE
SARS Coronavirus 2 by RT PCR: NEGATIVE

## 2021-08-10 LAB — MRSA NEXT GEN BY PCR, NASAL: MRSA by PCR Next Gen: NOT DETECTED

## 2021-08-10 LAB — TROPONIN I (HIGH SENSITIVITY): Troponin I (High Sensitivity): 5 ng/L (ref <18)

## 2021-08-10 MED ORDER — POLYETHYLENE GLYCOL 3350 17 G PO PACK: 17.0000 g | PACK | Freq: Every day | ORAL | Status: DC | PRN

## 2021-08-10 MED ORDER — LORAZEPAM 2 MG/ML IJ SOLN
4.0000 mg | Freq: Once | INTRAMUSCULAR | Status: AC
Start: 2021-08-10 — End: 2021-08-10
  Administered 2021-08-10: 4 mg via INTRAVENOUS

## 2021-08-10 MED ORDER — DOCUSATE SODIUM 100 MG PO CAPS: 100.0000 mg | ORAL_CAPSULE | Freq: Two times a day (BID) | ORAL | Status: DC | PRN

## 2021-08-10 MED ORDER — PROPOFOL 1000 MG/100ML IV EMUL
5.0000 ug/kg/min | INTRAVENOUS | Status: DC
  Administered 2021-08-10: 40 ug/kg/min via INTRAVENOUS

## 2021-08-10 MED ORDER — SODIUM CHLORIDE 0.9 % IV SOLN
2000.0000 mg | Freq: Once | INTRAVENOUS | Status: DC
Start: 2021-08-10 — End: 2021-08-10

## 2021-08-10 MED ORDER — PROPOFOL 10 MG/ML IV BOLUS
INTRAVENOUS | Status: AC
  Filled 2021-08-10: qty 20

## 2021-08-10 MED ORDER — LEVETIRACETAM IN NACL 1000 MG/100ML IV SOLN
1000.0000 mg | Freq: Once | INTRAVENOUS | Status: AC
  Administered 2021-08-10: 1000 mg via INTRAVENOUS
  Filled 2021-08-10: qty 100

## 2021-08-10 MED ORDER — HEPARIN SODIUM (PORCINE) 5000 UNIT/ML IJ SOLN
5000.0000 [IU] | Freq: Three times a day (TID) | INTRAMUSCULAR | Status: DC
  Administered 2021-08-10 – 2021-08-14 (×11): 5000 [IU] via SUBCUTANEOUS
  Filled 2021-08-10 (×11): qty 1

## 2021-08-10 MED ORDER — MIDAZOLAM HCL 2 MG/2ML IJ SOLN
INTRAMUSCULAR | Status: AC
  Administered 2021-08-10: 2 mg via INTRAVENOUS
  Filled 2021-08-10: qty 2

## 2021-08-10 MED ORDER — FAMOTIDINE 40 MG/5ML PO SUSR
20.0000 mg | Freq: Two times a day (BID) | ORAL | Status: DC
  Administered 2021-08-10 – 2021-08-11 (×3): 20 mg
  Filled 2021-08-10 (×5): qty 2.5

## 2021-08-10 MED ORDER — LORAZEPAM 2 MG/ML IJ SOLN
INTRAMUSCULAR | Status: DC
Start: 2021-08-10 — End: 2021-08-10
  Filled 2021-08-10: qty 2

## 2021-08-10 MED ORDER — ROCURONIUM BROMIDE 10 MG/ML (PF) SYRINGE: 100.0000 mg | PREFILLED_SYRINGE | Freq: Once | INTRAVENOUS | Status: AC

## 2021-08-10 MED ORDER — MIDAZOLAM HCL 2 MG/2ML IJ SOLN
2.0000 mg | Freq: Once | INTRAMUSCULAR | Status: AC
  Filled 2021-08-10: qty 2

## 2021-08-10 MED ORDER — CHLORHEXIDINE GLUCONATE CLOTH 2 % EX PADS
6.0000 | MEDICATED_PAD | Freq: Every day | CUTANEOUS | Status: DC
  Administered 2021-08-10 – 2021-08-14 (×4): 6 via TOPICAL

## 2021-08-10 MED ORDER — LORAZEPAM 2 MG/ML IJ SOLN
INTRAMUSCULAR | Status: AC
  Administered 2021-08-10: 4 mg via INTRAVENOUS
  Filled 2021-08-10: qty 2

## 2021-08-10 MED ORDER — PROPOFOL 1000 MG/100ML IV EMUL
INTRAVENOUS | Status: AC
  Administered 2021-08-10: 10 ug/kg/min via INTRAVENOUS
  Filled 2021-08-10: qty 100

## 2021-08-10 MED ORDER — LEVETIRACETAM IN NACL 1000 MG/100ML IV SOLN
1000.0000 mg | Freq: Two times a day (BID) | INTRAVENOUS | Status: DC
Start: 2021-08-10 — End: 2021-08-12
  Administered 2021-08-10 – 2021-08-11 (×3): 1000 mg via INTRAVENOUS
  Filled 2021-08-10 (×3): qty 100

## 2021-08-10 MED ORDER — LACTATED RINGERS IV BOLUS
1000.0000 mL | Freq: Once | INTRAVENOUS | Status: AC
  Administered 2021-08-10: 1000 mL via INTRAVENOUS

## 2021-08-10 MED ORDER — ROCURONIUM BROMIDE 10 MG/ML (PF) SYRINGE
PREFILLED_SYRINGE | INTRAVENOUS | Status: AC
  Administered 2021-08-10: 100 mg via INTRAVENOUS
  Filled 2021-08-10: qty 10

## 2021-08-10 MED ORDER — CHLORHEXIDINE GLUCONATE 0.12% ORAL RINSE (MEDLINE KIT)
15.0000 mL | Freq: Two times a day (BID) | OROMUCOSAL | Status: DC
  Administered 2021-08-10 – 2021-08-11 (×2): 15 mL via OROMUCOSAL

## 2021-08-10 MED ORDER — ORAL CARE MOUTH RINSE
15.0000 mL | OROMUCOSAL | Status: DC
  Administered 2021-08-10 – 2021-08-11 (×9): 15 mL via OROMUCOSAL

## 2021-08-10 MED ORDER — PROPOFOL 1000 MG/100ML IV EMUL
5.0000 ug/kg/min | INTRAVENOUS | Status: DC
  Administered 2021-08-10 – 2021-08-11 (×3): 60 ug/kg/min via INTRAVENOUS
  Filled 2021-08-10 (×3): qty 100

## 2021-08-10 NOTE — Consult Note (Addendum)
Neurology Consultation ? ?Reason for Consult: Breakthrough seizures ? ?Referring Physician: Dr. Shearon Stalls ? ?CC: Multiple seizures ? ?History is obtained from: Medical record ? ?HPI: Luis Hunter is a 46 y.o. male with past medical history of seizure disorder (no prior visits or care everywhere notes) who presented from his group home for evaluation of multiple seizures today. Per chart patient had 3 seizures prior to EMS arrival and was noted to have tonic-clonic seizing throughout EMS transport.  EMS gave 6 mg of Versed without resolution of seizures. He was given another 8 mg IV ativan for continued seizure activity after arrival to [facility], was intubated, was started on propofol and loaded with Keppra 2000 mg. Per RN, EMS saw patient having facial twitching en route to [facility] and the patient was bolused with additional propofol.  UDS Positive for cocaine, marijuana and benzos (did receive Versed from EMS). No family at the bedside. Neurology called to assist with seizure management ? ?ROS: Unable to obtain due to altered mental status.  ? ?No past medical history on file. ? ?No family history on file. ? ? ?Social History:  ? has no history on file for tobacco use, alcohol use, and drug use. ? ?Medications ? ?Current Facility-Administered Medications:  ?  chlorhexidine gluconate (MEDLINE KIT) (PERIDEX) 0.12 % solution 15 mL, 15 mL, Mouth Rinse, BID, Spero Geralds, MD ?  Derrill Memo ON 08/11/2021] Chlorhexidine Gluconate Cloth 2 % PADS 6 each, 6 each, Topical, Q0600, Spero Geralds, MD, 6 each at 08/10/21 1741 ?  MEDLINE mouth rinse, 15 mL, Mouth Rinse, 10 times per day, Spero Geralds, MD ? ? ?Exam: ?Current vital signs: ?BP (!) 120/92 (BP Location: Right Arm)   Pulse 83   Temp 97.9 ?F (36.6 ?C) (Bladder)   Resp 18   Ht 6' (1.829 m)   SpO2 100%   BMI 18.99 kg/m?  ?Vital signs in last 24 hours: ?Temp:  [96.6 ?F (35.9 ?C)-98.2 ?F (36.8 ?C)] 97.9 ?F (36.6 ?C) (04/07 1739) ?Pulse Rate:  [61-146] 83 (04/07 1739) ?Resp:   [12-30] 18 (04/07 1739) ?BP: (115-167)/(80-99) 120/92 (04/07 1739) ?SpO2:  [98 %-100 %] 100 % (04/07 1739) ?FiO2 (%):  [35 %-50 %] 40 % (04/07 1739) ?Weight:  [63.5 kg] 63.5 kg (04/07 1410) ? ?GENERAL: sedated and intubated ?HEENT: - Normocephalic and atraumatic, dry mm ET tube and OG tube ?LUNGS - Clear to auscultation bilaterally with no wheezes ?CV - S1S2 RRR, no m/r/g, equal pulses bilaterally. ?ABDOMEN - Soft, nontender, nondistended with normoactive BS ?Ext: warm, well perfused, intact peripheral pulses, no edema ? ?NEURO:  ?Mental Status: sedated, intubated on propofol @ 55mg/kg/min. Does not follow commands.  ?Speech: unable to assess due to intubation  ?Visual: unable to assess ?EOM: disconjugate, right eye with hard right gaze, left eye with slight right gaze ?Corneal reflex: intact  ?Face: Unable to assess due to ET tube ?Cough/gag: strong  ?Motor: localizes with left arm, withdraws tro painful stimuli in all 4 extremities ?Tone: is normal and bulk is normal ?Sensation- responds to noxious stimuli ?Coordination: unable to assess ?Gait- deferred ? ?Imaging ?I have reviewed the images obtained: ? ?CT-head 4/7: ?No acute abnormality  ? ?Assessment: BTaurus Hunter a 46y.o. male with past medical history of seizure disorder (no prior visits or care everywhere notes) who presented from his group home for evaluation of multiple seizures today. Per chart patient had 3 seizures prior to EMS arrival and was noted to have tonic-clonic seizing throughout EMS transport.  EMS gave 6 mg of Versed without resolution of seizures. Clinical seizure activity ceased after intubation with propofol and load of Keppra, but during transfer from [facility] to [facility], EMS noted some twitching requiring bolus of propofol. On arrival to [facility], no clinical seizure activity seen. Presentation is classifiable as status epilepticus. As he is on sedation, status will need to be ruled on with EEG.  ?- Exam reveals a sedated patient without clinical  seizure activity who does not open eyes of follow commands, but will withdraw to noxious stimuli in all 4 extremities.  ?- No notes in [place] or in [place] giving information on whether or not he was on an anticonvulsant as an outpatient.  ?- DDx for seizure etiology includes breakthrough seizures due to anticonvulsant noncompliance versus seizures in the setting of cocaine and marijuana use  ? ?Recommendations: ?- Seizure precautions ?- Continue propofol gtt and titrate as needed. Currently at a rate of 60.  ?- Keppra 1000 mg BID  ?- LTM being hooked up. Dr. Hortense Ramal to read. Dr. Rory Percy notified.  ?- Infectious workup per primary team  ?- Further recommendations will depend on the results of above tests. ?- Neurology will continue to follow  ? ?Beulah Gandy DNP, ACNPC-AG ? ?I have seen and examined the patient. I have discussed the assessment and recommendations with the Neurology NP and made amendations as needed. 46 year old male presenting to [facility] as a transfer from [facility] for management of status epilepticus. Exam reveals a sedated patient without clinical seizure activity who does not open eyes of follow commands, but will withdraw to noxious stimuli in all 4 extremities. Plan includes Keppra 1000 mg IV BID, propofol gtt and LTM EEG monitoring.  ?Electronically signed: Dr. Kerney Elbe ? ? ?

## 2021-08-10 NOTE — ED Notes (Signed)
There was a crack in urine collection bag and RN replaced and put a new collection bag on foley catheter.  ?

## 2021-08-10 NOTE — Progress Notes (Signed)
LTM EEG hooked up and running - no initial skin breakdown - push button tested - neuro notified. [facility] monitoring.  

## 2021-08-10 NOTE — ED Notes (Signed)
Called [facility] spoke w/Kimberly about possible transfer ?

## 2021-08-10 NOTE — ED Notes (Signed)
Pt had black pants, tan belt, black hoodie, and black checkered boxers. Pt's belongings were all covered in feces, vomit, urine, and blood, and had been cut multiple times by EMS. Belongings were placed in the trash but a white, multicolored quilt placed in bag and sent with patient.  ?

## 2021-08-10 NOTE — H&P (Signed)
? ?NAME:  Luis Hunter, MRN:  [id number], DOB:  Oct 02, 1975, LOS: 0 ?ADMISSION DATE:  08/10/2021, CONSULTATION DATE:  08/10/2021 ?REFERRING MD:  Dr. Carren Rang, neurology CHIEF COMPLAINT:  Status   ? ?History of Present Illness:  ?Luis Hunter is a 46 y.o. male who presented as a Luis Hunter after he was found down at a boardinghouse after suffering multiple seizures. Per EMS patient may be in a maintanve AED but unsure if patient is compliant. Per report patient had 3 seizures prior to EMS arrival and had continual seizure en route despite '6mg'$  of versed. He was given an additional '8mg'$  of ativan on ED arrival and later intubated with etomidate and rocuronium.  ? ?Neurology assessed patient at [facility] and requested transfer to [facility] for further management and need for continuous EEG ? ?Pertinent  Medical History  ?Seizure disorder ? ?Significant Hospital Events: ?Including procedures, antibiotic start and stop dates in addition to other pertinent events   ?4/7 admitted with status epilepticus ? ?Interim History / Subjective:  ?As above  ? ?Objective   ?Blood pressure (!) 120/92, pulse 93, temperature 98.1 ?F (36.7 ?C), resp. rate 18, height 6' (1.829 m), SpO2 100 %. ?   ?Vent Mode: PRVC ?FiO2 (%):  [35 %-50 %] 40 % ?Set Rate:  [18 bmp] 18 bmp ?Vt Set:  [500 mL-620 mL] 620 mL ?PEEP:  [5 cmH20] 5 cmH20 ?Plateau Pressure:  [11 cmH20] 11 cmH20  ?No intake or output data in the 24 hours ending 08/10/21 1810 ?There were no vitals filed for this visit. ? ?Examination: ?Gen:      Intubated, sedated, acutely ill appearing ?HEENT:  ETT to vent ?Lungs:    sounds of mechanical ventilation auscultated no wheezes or crackles ?CV:         tachycardic, regular ?Abd:      + bowel sounds; soft, non-tender; no palpable masses, no distension ?Ext:    No edema, thin ?Skin:      Warm and dry; hyperpigmentation noted on his face ?Neuro:   sedated, RASS -3 ? ? ?Resolved Hospital Problem list   ? ? ?Assessment & Plan:  ? ?Status epilepticus ?Seizure  disorder ?Need for mechanical ventilation secondary to above ? ? ?Plan to continue full vent support. Continue propofol. cEEG ordered and in process. Neurology following. Started on keppra. May need additional AEDs pending EEG. Head CT and neck CT reviewed at [facility] - multi-level DDD. No acute abnormality. ?Multiple admissions for similar presentation. Unclear what his home meds are. Will need MRI brain.  ? ? ?Best Practice (right click and "Reselect all SmartList Selections" daily)  ? ?Diet/type: NPO w/ meds via tube ?DVT prophylaxis: prophylactic heparin  ?GI prophylaxis: H2B ?Lines: N/A ?Foley:  N/A ?Code Status:  full code ?Last date of multidisciplinary goals of care discussion [I called sister Luis Hunter and left a message for call back. ] ? ?Labs   ?CBC: ?Recent Labs  ?Lab 08/10/21 ?1359  ?WBC 8.0  ?HGB 14.0  ?HCT 43.8  ?MCV 87.1  ?PLT 183  ? ? ?Basic Metabolic Panel: ?Recent Labs  ?Lab 08/10/21 ?1359  ?NA 141  ?K 3.6  ?CL 105  ?CO2 17*  ?GLUCOSE 96  ?BUN 12  ?CREATININE 1.05  ?CALCIUM 9.3  ? ?GFR: ?Estimated Creatinine Clearance: 78.1 mL/min (by C-G formula based on SCr of 1.05 mg/dL). ?Recent Labs  ?Lab 08/10/21 ?1359  ?WBC 8.0  ? ? ?Liver Function Tests: ?Recent Labs  ?Lab 08/10/21 ?1359  ?AST 34  ?ALT 13  ?  ALKPHOS 71  ?BILITOT 0.6  ?PROT 7.1  ?ALBUMIN 4.0  ? ?No results for input(s): LIPASE, AMYLASE in the last 168 hours. ?No results for input(s): AMMONIA in the last 168 hours. ? ?ABG ?   ?Component Value Date/Time  ? HCO3 26.6 08/10/2021 1415  ? ACIDBASEDEF 0.7 08/10/2021 1415  ? O2SAT 34.7 08/10/2021 1415  ?  ? ?Coagulation Profile: ?Recent Labs  ?Lab 08/10/21 ?1359  ?INR 1.0  ? ? ?Cardiac Enzymes: ?No results for input(s): CKTOTAL, CKMB, CKMBINDEX, TROPONINI in the last 168 hours. ? ?HbA1C: ?No results found for: HGBA1C ? ?CBG: ?Recent Labs  ?Lab 08/10/21 ?1628  ?GLUCAP 87  ? ? ?Review of Systems:   ?Unable to obtain ? ?Past Medical History:  ?He,  has no past medical history on file.  ? ?Surgical  History:  ?Unable to review - no family present.  ? ?Social History:  ?   ? ?Family History:  ?His family history is not on file.  ? ?Allergies ?Not on File  ? ?Home Medications  ?Prior to Admission medications   ?Not on File  ?  ? ?Critical care time: 67 ?  ? ? ?The patient is critically ill due to status epilepticus, vent managemnt.  Critical care was necessary to treat or prevent imminent or life-threatening deterioration.  Critical care was time spent personally by me on the following activities: development of treatment plan with patient and/or surrogate as well as nursing, discussions with consultants, evaluation of patient's response to treatment, examination of patient, obtaining history from patient or surrogate, ordering and performing treatments and interventions, ordering and review of laboratory studies, ordering and review of radiographic studies, pulse oximetry, re-evaluation of patient's condition and participation in multidisciplinary rounds.  ? ?Critical Care Time devoted to patient care services described in this note is 36 minutes. This time reflects time of care of this [place] . This critical care time does not reflect separately billable procedures or procedure time, teaching time or supervisory time of PA/NP/Med student/Med Resident etc but could involve care discussion time.   ?   ? ?Spero Geralds ?[facility] ?08/10/2021 6:32 PM  ?Pager: see AMION ? ?If no response to pager , please call critical care on call (see AMION) until 7pm ?After 7:00 pm call Elink   ? ? ? ? ?

## 2021-08-10 NOTE — ED Provider Notes (Signed)
? ?[facility] ?Provider Note ? ? ? Event Date/Time  ? First MD Initiated Contact with Patient 08/10/21 1359   ?  (approximate) ? ? ?History  ? ?Seizures ? ? ?HPI ? ?Luis Hunter is an unknown aged male approximately 71-65 with reportedly known seizure disorder who presents EMS from group home for evaluation of seizures.  Patient reportedly had 3 seizures prior to EMS arrival and was noted to have tonic-clonic seizing throughout EMS transport.  EMS gave 6 mg of Versed without resolution of seizures.  Patient is sleeping on arrival and unable to find any additional history.  No immediate family is available on arrival and patient has no formal identification to identify prior records.  No other history is immediately available on patient presentation ? ?  ? ? ?Physical Exam  ?Triage Vital Signs: ?ED Triage Vitals  ?Enc Vitals Group  ?   BP   ?   Pulse   ?   Resp   ?   Temp   ?   Temp src   ?   SpO2   ?   Weight   ?   Height   ?   Head Circumference   ?   Peak Flow   ?   Pain Score   ?   Pain Loc   ?   Pain Edu?   ?   Excl. in [place]?   ? ? ?Most recent vital signs: ?Vitals:  ? 08/10/21 1530 08/10/21 1540  ?BP: 116/80 115/81  ?Pulse: 73 81  ?Resp: (!) 25 (!) 23  ?Temp: 97.9 ?F (36.6 ?C) 98 ?F (36.7 ?C)  ?SpO2: 100% 99%  ? ? ?General: Deviated head to the right active tonic-clonic seizing in all extremities. ?CV:  Good peripheral perfusion.  Tachycardic and 2+ pulse. ?Resp:  Normal effort.  Rhonchorous bilaterally. ?Abd:  No distention.  Soft. ?Other:  There is some bilateral nystagmus and generalized shaking in all extremities.  Patient is nonresponsive to stimuli. ? ? ?ED Results / Procedures / Treatments  ?Labs ?(all labs ordered are listed, but only abnormal results are displayed) ?Labs Reviewed  ?BASIC METABOLIC PANEL - Abnormal; Notable for the following components:  ?    Result Value  ? CO2 17 (*)   ? GFR, Estimated 53 (*)   ? Anion gap 19 (*)   ? All other components within normal limits   ?CBC - Abnormal; Notable for the following components:  ? RDW 17.2 (*)   ? All other components within normal limits  ?BLOOD GAS, VENOUS - Abnormal; Notable for the following components:  ? pO2, Ven <31 (*)   ? All other components within normal limits  ?URINALYSIS, COMPLETE (UACMP) WITH MICROSCOPIC - Abnormal; Notable for the following components:  ? Color, Urine YELLOW (*)   ? APPearance CLEAR (*)   ? Protein, ur 30 (*)   ? All other components within normal limits  ?RESP PANEL BY RT-PCR (FLU A&B, COVID) [id number]  ?CULTURE, BLOOD (ROUTINE X 2)  ?CULTURE, BLOOD (ROUTINE X 2)  ?PROTIME-INR  ?APTT  ?ETHANOL  ?HEPATIC FUNCTION PANEL  ?LACTIC ACID, PLASMA  ?LACTIC ACID, PLASMA  ?URINE DRUG SCREEN, QUALITATIVE ([facility] ONLY)  ?CBG MONITORING, ED  ?TROPONIN I (HIGH SENSITIVITY)  ? ? ? ?EKG ? ?ECG is remarkable for sinus rhythm with a ventricular rate of 86, normal axis, unremarkable intervals with fairly diffuse ST changes concerning for demand ischemia. ? ? ?RADIOLOGY ? ?Postintubation chest x-ray shows ET tube in appropriate  position without any pneumothorax, effusion, edema, focal consolidation or other clear acute thoracic process.  I also reviewed radiology interpretation. ? ?PROCEDURES: ? ?Critical Care performed: Yes, see critical care procedure note(s) ? ?.Critical Care ?Performed by: Lucrezia Starch, MD ?Authorized by: Lucrezia Starch, MD  ? ?Critical care provider statement:  ?  Critical care time (minutes):  30 ?  Critical care was necessary to treat or prevent imminent or life-threatening deterioration of the following conditions:  CNS failure or compromise ?  Critical care was time spent personally by me on the following activities:  Development of treatment plan with patient or surrogate, discussions with consultants, evaluation of patient's response to treatment, examination of patient, ordering and review of laboratory studies, ordering and review of radiographic studies, ordering and performing treatments  and interventions, pulse oximetry, re-evaluation of patient's condition and review of old charts ?Procedure Name: Intubation ?Date/Time: 08/10/2021 2:48 PM ?Performed by: Lucrezia Starch, MD ?Pre-anesthesia Checklist: Emergency Drugs available, Patient being monitored, Patient identified, Suction available and Timeout performed ?Oxygen Delivery Method: Nasal cannula ?Preoxygenation: Pre-oxygenation with 100% oxygen ?Induction Type: Rapid sequence ?Tube size: 7.5 mm ?Number of attempts: 1 ?Airway Equipment and Method: Video-laryngoscopy ?Secured at: 25 cm ?Tube secured with: ETT holder ? ? ? ? ?MEDICATIONS ORDERED IN ED: ?Medications  ?propofol (DIPRIVAN) 1000 MG/100ML infusion (60 mcg/kg/min ? 63.5 kg Intravenous Rate/Dose Change 08/10/21 1537)  ?propofol (DIPRIVAN) 10 mg/mL bolus/IV push (has no administration in time range)  ?LORazepam (ATIVAN) 2 MG/ML injection (has no administration in time range)  ?LORazepam (ATIVAN) injection 4 mg (4 mg Intravenous Given 08/10/21 1410)  ?levETIRAcetam (KEPPRA) IVPB 1000 mg/100 mL premix (0 mg Intravenous Stopped 08/10/21 1508)  ?  Followed by  ?levETIRAcetam (KEPPRA) IVPB 1000 mg/100 mL premix (0 mg Intravenous Stopped 08/10/21 1508)  ?rocuronium bromide 10 mg/mL (PF) syringe (100 mg Intravenous Given 08/10/21 1412)  ?lactated ringers bolus 1,000 mL (1,000 mLs Intravenous New Bag/Given 08/10/21 1457)  ?midazolam (VERSED) injection 2 mg (2 mg Intravenous Given 08/10/21 1531)  ? ? ? ?IMPRESSION / MDM / ASSESSMENT AND PLAN / ED COURSE  ?I reviewed the triage vital signs and the nursing notes. ?             ?               ? ?Differential diagnosis includes, but is not limited to breakthrough seizure in the setting of underlying seizure disorder, metabolic derangements, trauma, toxic ingestion or withdrawal.  No obvious source of infection or trauma on exam.  On arrival patient is actively seizing and seems to be aspirating from some food particles in his mouth. ? ?Patient immediately given 4 mg  of Ativan followed by an additional 4 mg 5 minutes later due to persistent seizing.  He was subsequently emergently intubated per procedure note above and started on propofol. ? ?Postintubation chest x-ray shows ET tube in appropriate position without any pneumothorax, effusion, edema, focal consolidation or other clear acute thoracic process.  I also reviewed radiology interpretation. ? ?BMP is remarkable for bicarb of 17 and anion gap of 19 without any other significant electrolyte or metabolic derangements.  CBC without leukocytosis or acute anemia.  Platelets are normal.  INR and PTT are within normal limits.  Hepatic function panel is unremarkable.  Serum ethanol is undetectable.  Urinalysis is some protein but otherwise is unremarkable.  VBG remarkable for pH of 7.3 with PCO2 of 54 and bicarb of 26.6.  This was obtained shortly after  patient was placed on ventilator. ? ?  ?Given concern for status epilepticus I spoke with on-call neurologist Dr.  ? ?Bartholome Bill  ?Recommended transfer to [facility] for continuous EEG monitoring.  He notified on-call neurologist over there at [facility] Dr. Cheral Marker.  ? ?Patient accepted for transfer to [facility] medical ICU service by Dr. Maxcine Ham  ? ?Care patient signed over to assuming father approximately 23.  Patient is pending CT head and C-spine and transfer to [facility]. ? ? ?FINAL CLINICAL IMPRESSION(S) / ED DIAGNOSES  ? ?Final diagnoses:  ?Status epilepticus ([place])  ? ? ? ?Rx / DC Orders  ? ?ED Discharge Orders   ? ? None  ? ?  ? ? ? ?Note:  This document was prepared using Dragon voice recognition software and may include unintentional dictation errors. ?  ?Lucrezia Starch, MD ?08/10/21 1546 ? ?

## 2021-08-10 NOTE — Procedures (Signed)
Patient Name: Luis Hunter  ?MRN: [id number]  ?Epilepsy Attending: Lora Havens  ?Referring Physician/Provider: Kerney Elbe, MD ?Date: 08/10/2021 ?Duration: 23.39 mins ? ?Patient history: 46 y.o. male with past medical history of seizure disorder (no prior visits or care everywhere notes) who presented from his group home for evaluation of multiple seizures today. EEG to evaluate for seizure ? ?Level of alertness: comatose ? ?AEDs during EEG study: LEV, propofol ? ?Technical aspects: This EEG study was done with scalp electrodes positioned according to the 10-20 International system of electrode placement. Electrical activity was acquired at a sampling rate of '500Hz'$  and reviewed with a high frequency filter of '70Hz'$  and a low frequency filter of '1Hz'$ . EEG data were recorded continuously and digitally stored.  ? ?Description: EEG showed continuous generalized 3 to 6 Hz theta-delta slowing admixed with 15-'18Hz'$  generalized beta activity. Hyperventilation and photic stimulation were not performed.    ? ?ABNORMALITY ?- Continuous slow, generalized ?- Excessive beta, generalized ? ?IMPRESSION: ?This study is suggestive of severe diffuse encephalopathy, nonspecific etiology but likely related to sedation. No seizures or epileptiform discharges were seen throughout the recording. ? ?Lora Havens  ? ?

## 2021-08-10 NOTE — Progress Notes (Signed)
[facility]Link Physician-Brief Progress Note ?Patient Name: Luis Hunter ?DOB: [birth year] ?MRN: [id number] ? ? ?Date of Service ? 08/10/2021  ?HPI/Events of Note ? Patient on propofol. No orders  ?eICU Interventions ? Propofol ordered  ? ? ? ?Intervention Category ?Minor Interventions: Agitation / anxiety - evaluation and management ? ?Corisa Montini Rodman Pickle ?08/10/2021, 9:25 PM ?

## 2021-08-10 NOTE — ED Notes (Signed)
Pt accepted to [facility] report#[phone] ?

## 2021-08-10 NOTE — ED Notes (Signed)
Patient intubated.

## 2021-08-10 NOTE — ED Triage Notes (Addendum)
Patient to ER via [facility] from "boarding house". Patient had approximately x3 seizures prior to EMS arrival, patient then had tonic clonic seizure that lasted the duration of entire transport. Patient was given '6mg'$  IV versed with improvement of seizures.  ? ?Patients eyes and face still moving at this time. Seizing on arrival. ? ?Ems VS- bp 134/72, O2 sats upper 90's. Cbg 128 ? ?Unable to obtain any history from patient due to condition. EMS report that family at bedside were either non-verbal or irate.  ?

## 2021-08-10 NOTE — Consult Note (Addendum)
NEUROLOGY CONSULTATION NOTE  ? ?Date of service: August 10, 2021 ?Patient Name: Luis Hunter ?MRN:  [id number] ?DOB:  07/03/1939 ?Reason for consult: "multiple seizures" ?Requesting Provider: Lucrezia Starch, MD ?_ _ _   _ __   _ __ _ _  __ __   _ __   __ _ ? ?History of Present Illness  ? ?This is a unknown middle aged african [place] male with unknown age who was brought in by EMS from a boardinghouse after he had multiple seizures. Some reports by EMS that he is on an AED at home but unclear what he is on and whether he is compliant. He had 3 seizure prior to EMS arrival. Had a GTC during the entire transport. He was given '6mg'$  of Versed by EMS with continued seizure. In the ED was given '4mg'$  of Ativan and an additional '4mg'$  of Ativan for a total of '8mg'$  of Ativan for seizures. He was intubated with etomidate and rocuronium and started on propofol. ? ?On evaluation, no clinical signs of seizures at this time. Easily agitated. ? ?CTH w/o contrast is pending. Vitals with tachycadria, mild hypertension, no fever on vent but initiating breaths. Chemistry with no significant abnormality, glucose is 96,  cbc with no significant abnormalities. Urine drug screen has been collected and pending. ? ?ROS  ? ?Unable to get ROS 2/2 intubation and sedation. ? ?Past History  ?No past medical history on file. ? ?No family history on file. ?Social History  ? ?Socioeconomic History  ? Marital status: Not on file  ?  Spouse name: Not on file  ? Number of children: Not on file  ? Years of education: Not on file  ? Highest education level: Not on file  ?Occupational History  ? Not on file  ?Tobacco Use  ? Smoking status: Not on file  ? Smokeless tobacco: Not on file  ?Substance and Sexual Activity  ? Alcohol use: Not on file  ? Drug use: Not on file  ? Sexual activity: Not on file  ?Other Topics Concern  ? Not on file  ?Social History Narrative  ? Not on file  ? ?Social Determinants of Health  ? ?Financial Resource Strain: Not on file  ?Food  Insecurity: Not on file  ?Transportation Needs: Not on file  ?Physical Activity: Not on file  ?Stress: Not on file  ?Social Connections: Not on file  ? ?Not on File ? ?Medications  ?(Not in a hospital admission) ?  ? ?Vitals  ? ?Vitals:  ? 08/10/21 1405 08/10/21 1410 08/10/21 1410  ?BP:  (!) 167/97   ?Pulse: 95 (!) 112   ?Resp: (!) 26 (!) 30   ?Temp: 97.8 ?F (36.6 ?C)    ?TempSrc: Axillary    ?SpO2: 98% 100%   ?Weight:   63.5 kg  ?  ? ?There is no height or weight on file to calculate BMI. ? ?Physical Exam  ? ?General: Laying comfortably in bed; appears dishelved, easily agitated. ?HENT: Normal oropharynx and mucosa. Normal external appearance of ears and nose.  ?Neck: Supple, no pain or tenderness  ?CV: No JVD. No peripheral edema.  ?Pulmonary: Symmetric Chest rise. Normal respiratory effort.  ?Abdomen: Soft to touch, non-tender.  ?Ext: No cyanosis, edema, or deformity  ?Skin: No rash. Normal palpation of skin.   ?Musculoskeletal: Normal digits and nails by inspection. No clubbing. ? ?Neurologic Examination on propofol @ 10  ?Mental status/Cognition: eyes partially open, does not make eye contact, no attempts to communicate, does  not answer orientation questions. ?Speech/language: intubated with no speech, no attempts to communicate. ?Cranial nerves:  ? CN II Pupils equal and reactive to light, unable to assess VF deficit.  ? CN III,IV,VI EOM intact to dolls eyes, no gaze preference or deviation, no nystagmus  ? CN V Corneals positive BL  ? CN VII no asymmetry, no nasolabial fold flattening  ? CN VIII Does not turn head towards speech  ? CN IX & X Cough and gag intact.  ? CN XI Head midline.  ? CN XII midline tongue but doesn't protrude on command.  ? ?Motor/sensory:  ?Muscle bulk: normal, tone normal ?Some spontaneous movements noted in all extremities. Does not localize or withdraw to pinch in any extremities. ? ?Reflexes: ? Right Left Comments  ?Pectoralis     ? Biceps (C5/6) 2 2   ?Brachioradialis (C5/6) 2 2    ? Triceps (C6/7) 2 2   ? Patellar (L3/4) 3 3   ? Achilles (S1) 2 2   ? Hoffman     ? Plantar     ?Jaw jerk   ? ?Coordination/Complex Motor:  ?Unable to assess. ?Labs  ? ?CBC: No results for input(s): WBC, NEUTROABS, HGB, HCT, MCV, PLT in the last 168 hours. ? ?Basic Metabolic Panel: No results found for: NA, K, CO2, GLUCOSE, BUN, CREATININE, CALCIUM, GFRNONAA, GFRAA, GLUCOSE ?Lipid Panel: No results found for: [place] ?HgbA1c: No results found for: HGBA1C ?Urine Drug Screen: No results found for: LABOPIA, COCAINSCRNUR, [place], [place], THCU, LABBARB  ?Alcohol Level No results found for: [place] ? ?CT Head without contrast: ?pending ? ?CT Cervical spine: ?pending ? ?MRI Brain: ?Will need eventually ? ?cEEG:  ?Not available here and being transferred. ? ?Impression  ? ?This is a unknown middle aged african [place] male with unknown medical history who presents with multiple seizures and concern for status epilepticus. ? ?Recommendations  ?- Keppra 2G IV once ?- Ativan '2mg'$  for any seizure activity lasting more than 3 mins ?- cEEG and being transferred to [facility] for cEEG ?- CTH w/o contrast is pending. ?- continue propofol, increase to 13mg/Kg/min given agitation ?- Will need MRI Brain eventually ?- Urine drug screen collected and pending. ?[facility]. ?________________________________________________________________ ? ?This patient is critically ill and at significant risk of neurological worsening, death and care requires constant monitoring of vital signs, hemodynamics,respiratory and cardiac monitoring, neurological assessment, discussion with family, other specialists and medical decision making of high complexity. I spent 40 minutes of neurocritical care time  in the care of  this patient. This was time spent independent of any time provided by nurse practitioner or PA. ? ?SDonnetta Simpers?[facility] ?Pager Number [phone]?08/10/2021   3:26 PM ? ? ?Update: ?4:29 PM ?Reviewed CTH w/o contrast with no acute intracranial abnormalities. UDS positive for cocaine, marijuana. Also positive for benzos but was given Versed by EMS and Ativan '8mg'$  in the ED. ? ? ?Thank you for the opportunity to take part in the care of this patient. If you have any further questions, please contact the neurology consultation attending. ? ?Signed, ? ?SDonnetta Simpers?[facility] ?Pager Number [phone]?_ _ _   _ __   _ __ _ _  __ __   _ __   __ _ ? ?

## 2021-08-10 NOTE — Progress Notes (Signed)
STAT EEG complete - results pending. ? ?

## 2021-08-10 NOTE — ED Notes (Signed)
RN attempted to call and give report to 4N nurse station.  ?

## 2021-08-10 NOTE — ED Notes (Signed)
Md Tamala Julian preparing to intubate patient ?

## 2021-08-11 DIAGNOSIS — J9601 Acute respiratory failure with hypoxia: Secondary | ICD-10-CM

## 2021-08-11 DIAGNOSIS — Z1612 Extended spectrum beta lactamase (ESBL) resistance: Secondary | ICD-10-CM | POA: Diagnosis not present

## 2021-08-11 DIAGNOSIS — G40901 Epilepsy, unspecified, not intractable, with status epilepticus: Secondary | ICD-10-CM | POA: Diagnosis not present

## 2021-08-11 DIAGNOSIS — A498 Other bacterial infections of unspecified site: Secondary | ICD-10-CM

## 2021-08-11 LAB — BLOOD CULTURE ID PANEL (REFLEXED) - BCID2

## 2021-08-11 LAB — BASIC METABOLIC PANEL
Anion gap: 8 (ref 5–15)
BUN: 11 mg/dL (ref 6–20)
CO2: 24 mmol/L (ref 22–32)
Calcium: 9.1 mg/dL (ref 8.9–10.3)
Chloride: 108 mmol/L (ref 98–111)
Creatinine, Ser: 0.97 mg/dL (ref 0.61–1.24)
GFR, Estimated: >60 mL/min (ref >60)
Glucose, Bld: 94 mg/dL (ref 70–99)
Potassium: 3.9 mmol/L (ref 3.5–5.1)
Sodium: 140 mmol/L (ref 135–145)

## 2021-08-11 LAB — CBC
HCT: 39.3 % (ref 39.0–52.0)
Hemoglobin: 13.3 g/dL (ref 13.0–17.0)
MCH: 28.5 pg (ref 26.0–34.0)
MCHC: 33.8 g/dL (ref 30.0–36.0)
MCV: 84.3 fL (ref 80.0–100.0)
Platelets: 166 10*3/uL (ref 150–400)
RBC: 4.66 MIL/uL (ref 4.22–5.81)
RDW: 17.4 % — ABNORMAL HIGH (ref 11.5–15.5)
WBC: 10.9 10*3/uL — ABNORMAL HIGH (ref 4.0–10.5)
nRBC: 0 % (ref 0.0–0.2)

## 2021-08-11 LAB — TRIGLYCERIDES: Triglycerides: 48 mg/dL (ref <150)

## 2021-08-11 LAB — HIV ANTIBODY (ROUTINE TESTING W REFLEX): HIV Screen 4th Generation wRfx: NONREACTIVE

## 2021-08-11 MED ORDER — SODIUM CHLORIDE 0.9 % IV SOLN
1.0000 g | Freq: Three times a day (TID) | INTRAVENOUS | Status: DC
  Administered 2021-08-11 – 2021-08-14 (×10): 1 g via INTRAVENOUS
  Filled 2021-08-11 (×12): qty 20

## 2021-08-11 MED ORDER — ACETAMINOPHEN 160 MG/5ML PO SOLN
500.0000 mg | Freq: Four times a day (QID) | ORAL | Status: DC | PRN
  Administered 2021-08-11: 500 mg

## 2021-08-11 MED ORDER — DEXMEDETOMIDINE HCL IN NACL 400 MCG/100ML IV SOLN
0.4000 ug/kg/h | INTRAVENOUS | Status: DC
  Administered 2021-08-11: 0.4 ug/kg/h via INTRAVENOUS

## 2021-08-11 MED ORDER — ACETAMINOPHEN 160 MG/5ML PO SOLN
ORAL | Status: AC
  Filled 2021-08-11: qty 20.3

## 2021-08-11 MED ORDER — DEXMEDETOMIDINE HCL IN NACL 400 MCG/100ML IV SOLN
INTRAVENOUS | Status: AC
  Filled 2021-08-11: qty 100

## 2021-08-11 NOTE — Progress Notes (Signed)
Maint. Complete. Skin check performed. No break down.  ?

## 2021-08-11 NOTE — Procedures (Addendum)
Patient Name: Luis Hunter  ?MRN: [id number]  ?Epilepsy Attending: Lora Havens  ?Referring Physician/Provider: August Albino, NP ?Duration: 08/10/2021 2057 to 08/11/2021 2057 ?  ?Patient history: 46 y.o. male with past medical history of seizure disorder (no prior visits or care everywhere notes) who presented from his group home for evaluation of multiple seizures today. EEG to evaluate for seizure ?  ?Level of alertness: comatose-->awake,asleep ?  ?AEDs during EEG study: LEV, propofol ?  ?Technical aspects: This EEG study was done with scalp electrodes positioned according to the 10-20 International system of electrode placement. Electrical activity was acquired at a sampling rate of '500Hz'$  and reviewed with a high frequency filter of '70Hz'$  and a low frequency filter of '1Hz'$ . EEG data were recorded continuously and digitally stored.  ?  ?Description: At the beginning of study, EEG showed continuous generalized 3 to 6 Hz theta-delta slowing admixed with 15-'18Hz'$  generalized beta activity. ? ? As sedation was weaned, EEG showed The posterior dominant rhythm consists of 8-9 Hz activity of moderate voltage (25-35 uV) seen predominantly in posterior head regions, symmetric and reactive to eye opening and eye closing. Sleep was characterized by vertex waves, sleep spindles (12 to 14 Hz), maximal frontocentral region. Hyperventilation and photic stimulation were not performed.    ?  ?ABNORMALITY ?- Continuous slow, generalized ?- Excessive beta, generalized ?  ?IMPRESSION: ?This study was initially suggestive of severe diffuse encephalopathy, nonspecific etiology but likely related to sedation. As sedation was weaned, EEG was within normal limits. No seizures or epileptiform discharges were seen throughout the recording. ?  ?Lora Havens  ? ?

## 2021-08-11 NOTE — Progress Notes (Signed)
? ?NAME:  Luis Hunter, MRN:  [id number], DOB:  [birth year], LOS: 1 ?ADMISSION DATE:  08/10/2021, CONSULTATION DATE:  08/10/2021 ?REFERRING MD:  Dr. Carren Rang, neurology CHIEF COMPLAINT:  Status   ? ?History of Present Illness:  ?Luis Hunter is a 46 y.o. male who presented as a Luis Hunter after he was found down at a boardinghouse after suffering multiple seizures. Per EMS patient may be in a maintanve AED but unsure if patient is compliant. Per report patient had 3 seizures prior to EMS arrival and had continual seizure en route despite '6mg'$  of versed. He was given an additional '8mg'$  of ativan on ED arrival and later intubated with etomidate and rocuronium.  ? ?Neurology assessed patient at [facility] and requested transfer to [facility] for further management and need for continuous EEG ? ?Pertinent  Medical History  ?Seizure disorder ? ?Significant Hospital Events: ?Including procedures, antibiotic start and stop dates in addition to other pertinent events   ?4/7 admitted with status epilepticus ?4/7 blood cultures positive for ESBL E. Coli ? ?Interim History / Subjective:  ?Overnight EEG showed no seizures, blood cultures positive. Febrile this am.  ? ?Objective   ?Blood pressure (!) 134/99, pulse 99, temperature (!) 100.6 ?F (38.1 ?C), temperature source Bladder, resp. rate (!) 24, height 6' (1.829 m), weight 63.5 kg, SpO2 98 %. ?   ?Vent Mode: PRVC ?FiO2 (%):  [35 %-50 %] 40 % ?Set Rate:  [18 bmp] 18 bmp ?Vt Set:  [500 mL-620 mL] 620 mL ?PEEP:  [5 cmH20] 5 cmH20 ?Plateau Pressure:  [11 cmH20-14 cmH20] 14 cmH20  ? ?Intake/Output Summary (Last 24 hours) at 08/11/2021 0923 ?Last data filed at 08/11/2021 0800 ?Gross per 24 hour  ?Intake 408.41 ml  ?Output 550 ml  ?Net -141.59 ml  ? ?Filed Weights  ? 08/11/21 0500  ?Weight: 63.5 kg  ? ? ?Examination: ?Gen:      Intubated, sedated, acutely ill appearing ?HEENT:  ETT to vent, EEG leads in place ?Lungs:    sounds of mechanical ventilation auscultated, bilateral expiratory wheezing ?CV:          tachycardic, regular ?Abd:      + bowel sounds; soft, non-tender; no palpable masses, no distension ?Ext:    No edema ?Skin:      Warm and dry; no rashes ?Neuro:   sedated, RASS +1, moves all 4 extremities ? ? ?Resolved Hospital Problem list   ? ? ?Assessment & Plan:  ? ?Status epilepticus ?Seizure disorder, with likely non-adherence to medications ?- no further seizures noted on EEG ?- continue keppra and AEDs per neurology ? ?Need for mechanical ventilation secondary to above ?Need for sedation for mechanical ventilation ?- change propofol to precedex ?- lighten sedation for plans for extubation ?- will start tube feeds if not extubating today ? ?ESBL E. Coli Bacteremia ?- on meropenem started overnight ? ? ? ?Best Practice (right click and "Reselect all SmartList Selections" daily)  ? ?Diet/type: NPO w/ meds via tube ?DVT prophylaxis: prophylactic heparin  ?GI prophylaxis: H2B ?Lines: N/A ?Foley:  N/A ?Code Status:  full code ?Last date of multidisciplinary goals of care discussion [Will update daughter Lauralyn Hunter today by phone [phone] ? ?Labs   ?CBC: ?Recent Labs  ?Lab 08/10/21 ?1359 08/10/21 ?1820 08/11/21 ?8341  ?WBC 8.0  --  10.9*  ?HGB 14.0 13.6 13.3  ?HCT 43.8 40.0 39.3  ?MCV 87.1  --  84.3  ?PLT 183  --  166  ? ? ?Basic Metabolic Panel: ?Recent Labs  ?Lab  08/10/21 ?1359 08/10/21 ?1820 08/11/21 ?1093  ?NA 141 140 140  ?K 3.6 3.7 3.9  ?CL 105  --  108  ?CO2 17*  --  24  ?GLUCOSE 96  --  94  ?BUN 12  --  11  ?CREATININE 1.05  --  0.97  ?CALCIUM 9.3  --  9.1  ? ?GFR: ?Estimated Creatinine Clearance: 84.6 mL/min (by C-G formula based on SCr of 0.97 mg/dL). ?Recent Labs  ?Lab 08/10/21 ?1359 08/11/21 ?2355  ?WBC 8.0 10.9*  ? ? ?Liver Function Tests: ?Recent Labs  ?Lab 08/10/21 ?1359  ?AST 34  ?ALT 13  ?ALKPHOS 71  ?BILITOT 0.6  ?PROT 7.1  ?ALBUMIN 4.0  ? ?No results for input(s): LIPASE, AMYLASE in the last 168 hours. ?No results for input(s): AMMONIA in the last 168 hours. ? ?ABG ?   ?Component Value Date/Time   ? PHART 7.434 08/10/2021 1820  ? PCO2ART 37.1 08/10/2021 1820  ? PO2ART 99 08/10/2021 1820  ? HCO3 24.9 08/10/2021 1820  ? TCO2 26 08/10/2021 1820  ? ACIDBASEDEF 0.7 08/10/2021 1415  ? O2SAT 98 08/10/2021 1820  ?  ?Critical care time: 1 ?  ? ?The patient is critically ill due to respiratory failure, status epilepticus.  Critical care was necessary to treat or prevent imminent or life-threatening deterioration.  Critical care was time spent personally by me on the following activities: development of treatment plan with patient and/or surrogate as well as nursing, discussions with consultants, evaluation of patient's response to treatment, examination of patient, obtaining history from patient or surrogate, ordering and performing treatments and interventions, ordering and review of laboratory studies, ordering and review of radiographic studies, pulse oximetry, re-evaluation of patient's condition and participation in multidisciplinary rounds.  ? ?Critical Care Time devoted to patient care services described in this note is 35 minutes. This time reflects time of care of this Luis Hunter . This critical care time does not reflect separately billable procedures or procedure time, teaching time or supervisory time of PA/NP/Med student/Med Resident etc but could involve care discussion time.   ?   ? ?Luis Hunter ?[facility] ?08/11/2021 9:23 AM  ?Pager: see AMION ? ?If no response to pager , please call critical care on call (see AMION) until 7pm ?After 7:00 pm call Elink   ? ? ? ?

## 2021-08-11 NOTE — Progress Notes (Addendum)
Subjective: ?Becomes agitated if propofol is reduced below 50 or 60 mcg/kg/min ? ?Objective: ?Current vital signs: ?BP (!) 134/99 (BP Location: Right Arm)   Pulse 99   Temp (!) 100.6 ?F (38.1 ?C) (Bladder)   Resp (!) 24   Ht 6' (1.829 m)   Wt 63.5 kg   SpO2 98%   BMI 18.99 kg/m?  ?Vital signs in last 24 hours: ?Temp:  [96.6 ?F (35.9 ?C)-100.6 ?F (38.1 ?C)] 100.6 ?F (38.1 ?C) (04/08 0800) ?Pulse Rate:  [61-146] 99 (04/08 0800) ?Resp:  [12-30] 24 (04/08 0800) ?BP: (97-167)/(77-99) 134/99 (04/08 0800) ?SpO2:  [98 %-100 %] 98 % (04/08 0806) ?FiO2 (%):  [35 %-50 %] 40 % (04/08 0806) ?Weight:  [63.5 kg] 63.5 kg (04/08 0500) ? ?Intake/Output from previous day: ?04/07 0701 - 04/08 0700 ?In: 385.7 [I.V.:222.8; IV Piggyback:162.9] ?Out: 550 [Urine:550] ?Intake/Output this shift: ?Total I/O ?In: 22.7 [I.V.:22.7] ?Out: -  ?Nutritional status:  ?Diet Order   ? ?       ?  Diet NPO time specified  Diet effective now       ?  ? ?  ?  ? ?  ? ?HEENT: EEG leads in place ?Lungs: Intubated ?Ext: No edema ? ?Neurologic Exam: ?Mental Status: Sedated, intubated on propofol @ 50 mcg/kg/min. Does not respond to calling of his name. Does not follow commands. Does not open eyes to any stimuli. No attempts to communicate. No purposeful movements to noxious stimuli (on propofol) ?CN: Pupils small and sluggishly reactive to nonreactive (on propofol). No blink to threat. Minimal to no doll's eye reflex. No blink to eyelid stimulation. ?Motor/Sensory: No movement to light pinch of BUE. Minimal movement of BLE with mild noxious plantar stimulation. Does move spontaneously and nonpurposefully at times. Tone decreased x 4 (sedated on propofol) ?Reflexes: Hypoactive ?Coordination/Gait: unable to assess ? ? ?Lab Results: ?Results for orders placed or performed during the hospital encounter of 08/10/21 (from the past 48 hour(s))  ?MRSA Next Gen by PCR, Nasal     Status: None  ? Collection Time: 08/10/21  5:42 PM  ? Specimen: Nasal Mucosa; Nasal  Swab  ?Result Value Ref Range  ? MRSA by PCR Next Gen NOT DETECTED NOT DETECTED  ?  Comment: (NOTE) ?The GeneXpert MRSA Assay (FDA approved for NASAL specimens only), ?is one component of a comprehensive MRSA colonization surveillance ?program. It is not intended to diagnose MRSA infection nor to guide ?or monitor treatment for MRSA infections. ?Test performance is not FDA approved in patients less than 2 years ?old. ?Performed at [facility], [place] [address], [place], [place] ?[address] ?  ?I-STAT 7, (LYTES, BLD GAS, ICA, H+H)     Status: None  ? Collection Time: 08/10/21  6:20 PM  ?Result Value Ref Range  ? pH, Arterial 7.434 7.35 - 7.45  ? pCO2 arterial 37.1 32 - 48 mmHg  ? pO2, Arterial 99 83 - 108 mmHg  ? Bicarbonate 24.9 20.0 - 28.0 mmol/L  ? TCO2 26 22 - 32 mmol/L  ? O2 Saturation 98 %  ? Acid-Base Excess 1.0 0.0 - 2.0 mmol/L  ? Sodium 140 135 - 145 mmol/L  ? Potassium 3.7 3.5 - 5.1 mmol/L  ? Calcium, Ion 1.26 1.15 - 1.40 mmol/L  ? HCT 40.0 39.0 - 52.0 %  ? Hemoglobin 13.6 13.0 - 17.0 g/dL  ? Patient temperature 98.1 F   ? Collection site RADIAL, ALLEN'S TEST ACCEPTABLE   ? Drawn by RT   ? Sample  type ARTERIAL   ?CBC     Status: Abnormal  ? Collection Time: 08/11/21  5:08 AM  ?Result Value Ref Range  ? WBC 10.9 (H) 4.0 - 10.5 K/uL  ? RBC 4.66 4.22 - 5.81 MIL/uL  ? Hemoglobin 13.3 13.0 - 17.0 g/dL  ? HCT 39.3 39.0 - 52.0 %  ? MCV 84.3 80.0 - 100.0 fL  ? MCH 28.5 26.0 - 34.0 pg  ? MCHC 33.8 30.0 - 36.0 g/dL  ? RDW 17.4 (H) 11.5 - 15.5 %  ? Platelets 166 150 - 400 K/uL  ? nRBC 0.0 0.0 - 0.2 %  ?  Comment: Performed at [facility], [place] [address] [place], [place] [address]  ?Basic metabolic panel     Status: None  ? Collection Time: 08/11/21  5:08 AM  ?Result Value Ref Range  ? Sodium 140 135 - 145 mmol/L  ? Potassium 3.9 3.5 - 5.1 mmol/L  ? Chloride 108 98 - 111 mmol/L  ? CO2 24 22 - 32 mmol/L  ? Glucose, Bld 94 70 - 99 mg/dL  ?  Comment: Glucose reference range applies only to samples taken after  fasting for at least 8 hours.  ? BUN 11 6 - 20 mg/dL  ? Creatinine, Ser 0.97 0.61 - 1.24 mg/dL  ? Calcium 9.1 8.9 - 10.3 mg/dL  ? GFR, Estimated >60 >60 mL/min  ?  Comment: (NOTE) ?Calculated using the CKD-EPI Creatinine Equation (2021) ?  ? Anion gap 8 5 - 15  ?  Comment: Performed at [facility], [place] [address] [place], [place] [address]  ?Triglycerides     Status: None  ? Collection Time: 08/11/21  5:08 AM  ?Result Value Ref Range  ? Triglycerides 48 <150 mg/dL  ?  Comment: Performed at [facility], [place] [address], [place], [place] [address]  ? ? ?Recent Results (from the past 240 hour(s))  ?Resp Panel by RT-PCR (Flu A&B, Covid) Nasopharyngeal Swab     Status: None  ? Collection Time: 08/10/21  2:16 PM  ? Specimen: Nasopharyngeal Swab; Nasopharyngeal(NP) swabs in vial transport medium  ?Result Value Ref Range Status  ? SARS Coronavirus 2 by RT PCR NEGATIVE NEGATIVE Final  ?  Comment: (NOTE) ?SARS-CoV-2 target nucleic acids are NOT DETECTED. ? ?The SARS-CoV-2 RNA is generally detectable in upper respiratory ?specimens during the acute phase of infection. The lowest ?concentration of SARS-CoV-2 viral copies this assay can detect is ?138 copies/mL. A negative result does not preclude SARS-Cov-2 ?infection and should not be used as the sole basis for treatment or ?other patient management decisions. A negative result may occur with  ?improper specimen collection/handling, submission of specimen other ?than nasopharyngeal swab, presence of viral mutation(s) within the ?areas targeted by this assay, and inadequate number of viral ?copies(<138 copies/mL). A negative result must be combined with ?clinical observations, patient history, and epidemiological ?information. The expected result is Negative. ? ?Fact Sheet for Patients:  ?EntrepreneurPulse.com.au ? ?Fact Sheet for Healthcare Providers:  ?IncredibleEmployment.be ? ?This test is no t yet approved or cleared by the  [place] FDA and  ?has been authorized for detection and/or diagnosis of SARS-CoV-2 by ?FDA under an Emergency Use Authorization (EUA). This EUA will remain  ?in effect (meaning this test can be used) for the duration of the ?COVID-19 declaration under Section 564(b)(1) of the Act, 21 ?U.S.C.section 360bbb-3(b)(1), unless the authorization is terminated  ?or revoked sooner.  ? ? ?  ? Influenza A  by PCR NEGATIVE NEGATIVE Final  ? Influenza B by PCR NEGATIVE NEGATIVE Final  ?  Comment: (NOTE) ?The Xpert Xpress SARS-CoV-2/FLU/RSV plus assay is intended as an aid ?in the diagnosis of influenza from Nasopharyngeal swab specimens and ?should not be used as a sole basis for treatment. Nasal washings and ?aspirates are unacceptable for Xpert Xpress SARS-CoV-2/FLU/RSV ?testing. ? ?Fact Sheet for Patients: ?EntrepreneurPulse.com.au ? ?Fact Sheet for Healthcare Providers: ?IncredibleEmployment.be ? ?This test is not yet approved or cleared by the [place] FDA and ?has been authorized for detection and/or diagnosis of SARS-CoV-2 by ?FDA under an Emergency Use Authorization (EUA). This EUA will remain ?in effect (meaning this test can be used) for the duration of the ?COVID-19 declaration under Section 564(b)(1) of the Act, 21 U.S.C. ?section 360bbb-3(b)(1), unless the authorization is terminated or ?revoked. ? ?Performed at [facility], [place], ?[place] [address] ?  ?Blood Culture (routine x 2)     Status: None (Preliminary result)  ? Collection Time: 08/10/21  2:18 PM  ? Specimen: BLOOD  ?Result Value Ref Range Status  ? Specimen Description BLOOD LEFT ANTECUBITAL  Final  ? Special Requests   Final  ?  BOTTLES DRAWN AEROBIC AND ANAEROBIC Blood Culture adequate volume  ? Culture  Setup Time   Final  ?  GRAM NEGATIVE RODS ?AEROBIC BOTTLE ONLY ?CRITICAL VALUE NOTED.  VALUE IS CONSISTENT WITH PREVIOUSLY REPORTED AND CALLED VALUE. ?Performed at [facility], [address] [place], [place] [address] ?  ? Culture GRAM NEGATIVE RODS  Final  ? Report Status PENDING  Incomplete  ?Blood Culture (routine x 2)     Status: None (Preliminary result)  ? Collection

## 2021-08-11 NOTE — Progress Notes (Signed)
OVERNIGHT CROSS COVERAGE NOTE ? ?No seizures on EEG. Continue current meds. Neurology will continue to follow and titrate as needed. ?Blood c/s ESBL Ecoli. Started on Meropenem. ?-- ?Amie Portland, MD ?Neurologist ?[facility] ?Pager: [phone] ? ?

## 2021-08-11 NOTE — Progress Notes (Signed)
PHARMACY - PHYSICIAN COMMUNICATION ?CRITICAL VALUE ALERT - BLOOD CULTURE IDENTIFICATION (BCID) ? ?Luis Hunter is an 46 y.o. male who presented to [facility] on 08/10/2021 with a chief complaint of AMS/seizures ? ?Assessment:   ?Blood culture growing ESBL-producing E. coli ? ?Name of physician (or Provider) Contacted:  ?Dr. Loanne Drilling ? ?Current antibiotics: None ? ?Changes to prescribed antibiotics recommended:  ?Start Meropenem 1 g IV q8h ? ?Results for orders placed or performed during the hospital encounter of 08/10/21  ?Blood Culture ID Panel (Reflexed) (Collected: 08/10/2021  2:23 PM)  ?Result Value Ref Range  ? Enterococcus faecalis NOT DETECTED NOT DETECTED  ? Enterococcus Faecium NOT DETECTED NOT DETECTED  ? Listeria monocytogenes NOT DETECTED NOT DETECTED  ? Staphylococcus species NOT DETECTED NOT DETECTED  ? Staphylococcus aureus (BCID) NOT DETECTED NOT DETECTED  ? Staphylococcus epidermidis NOT DETECTED NOT DETECTED  ? Staphylococcus lugdunensis NOT DETECTED NOT DETECTED  ? Streptococcus species NOT DETECTED NOT DETECTED  ? Streptococcus agalactiae NOT DETECTED NOT DETECTED  ? Streptococcus pneumoniae NOT DETECTED NOT DETECTED  ? Streptococcus pyogenes NOT DETECTED NOT DETECTED  ? A.calcoaceticus-baumannii NOT DETECTED NOT DETECTED  ? Bacteroides fragilis NOT DETECTED NOT DETECTED  ? Enterobacterales DETECTED (A) NOT DETECTED  ? Enterobacter cloacae complex NOT DETECTED NOT DETECTED  ? Escherichia coli DETECTED (A) NOT DETECTED  ? Klebsiella aerogenes NOT DETECTED NOT DETECTED  ? Klebsiella oxytoca NOT DETECTED NOT DETECTED  ? Klebsiella pneumoniae NOT DETECTED NOT DETECTED  ? Proteus species NOT DETECTED NOT DETECTED  ? Salmonella species NOT DETECTED NOT DETECTED  ? Serratia marcescens NOT DETECTED NOT DETECTED  ? Haemophilus influenzae NOT DETECTED NOT DETECTED  ? Neisseria meningitidis NOT DETECTED NOT DETECTED  ? Pseudomonas aeruginosa NOT DETECTED NOT DETECTED  ? Stenotrophomonas maltophilia NOT DETECTED  NOT DETECTED  ? Candida albicans NOT DETECTED NOT DETECTED  ? Candida auris NOT DETECTED NOT DETECTED  ? Candida glabrata NOT DETECTED NOT DETECTED  ? Candida krusei NOT DETECTED NOT DETECTED  ? Candida parapsilosis NOT DETECTED NOT DETECTED  ? Candida tropicalis NOT DETECTED NOT DETECTED  ? Cryptococcus neoformans/gattii NOT DETECTED NOT DETECTED  ? CTX-M ESBL DETECTED (A) NOT DETECTED  ? Carbapenem resistance IMP NOT DETECTED NOT DETECTED  ? Carbapenem resistance KPC NOT DETECTED NOT DETECTED  ? Carbapenem resistance NDM NOT DETECTED NOT DETECTED  ? Carbapenem resist OXA 48 LIKE NOT DETECTED NOT DETECTED  ? Carbapenem resistance VIM NOT DETECTED NOT DETECTED  ? ? ?[name], [name] ?08/11/2021  5:30 AM ? ?

## 2021-08-11 NOTE — Procedures (Signed)
Extubation Procedure Note ? ?Patient Details:   ?Name: Luis Hunter ?DOB: 1975/11/16 ?MRN: [id number] ?  ?Airway Documentation:  ?  ?Vent end date: 08/11/21 Vent end time: 1535  ? ?Evaluation ? O2 sats: stable throughout ?Complications: No apparent complications ?Patient did tolerate procedure well. ?Bilateral Breath Sounds: Clear, Diminished ?  ?Yes ? ?Patient tolerated wean. Positive for cuff leak. Patient extubated to a 2 Lpm nasal cannula. No signs of dyspnea or stridor noted. RN at bedside.  ? ?Myrtie Neither ?08/11/2021, 3:38 PM ? ?

## 2021-08-12 DIAGNOSIS — G40901 Epilepsy, unspecified, not intractable, with status epilepticus: Secondary | ICD-10-CM | POA: Diagnosis not present

## 2021-08-12 DIAGNOSIS — R7881 Bacteremia: Secondary | ICD-10-CM | POA: Diagnosis not present

## 2021-08-12 MED ORDER — LEVETIRACETAM 500 MG PO TABS
1000.0000 mg | ORAL_TABLET | Freq: Two times a day (BID) | ORAL | Status: DC
  Administered 2021-08-12: 1000 mg via ORAL
  Filled 2021-08-12: qty 2

## 2021-08-12 MED ORDER — LEVETIRACETAM 750 MG PO TABS
1500.0000 mg | ORAL_TABLET | Freq: Two times a day (BID) | ORAL | Status: DC
  Administered 2021-08-12 – 2021-08-14 (×4): 1500 mg via ORAL
  Filled 2021-08-12 (×4): qty 2

## 2021-08-12 NOTE — Progress Notes (Signed)
LTM maint complete - no skin breakdown under:  p1 Fp2 A2 A1 ?[facility] monitored, Event button test confirmed by [facility]. ? ? ?

## 2021-08-12 NOTE — Progress Notes (Signed)
Subjective: ?Now awake, alert and conversational.  ? ?Objective: ?Current vital signs: ?BP 112/76   Pulse 82   Temp 98.4 ?F (36.9 ?C)   Resp (!) 22   Ht 6' (1.829 m)   Wt 63.8 kg   SpO2 97%   BMI 19.08 kg/m?  ?Vital signs in last 24 hours: ?Temp:  [98.4 ?F (36.9 ?C)-102.9 ?F (39.4 ?C)] 98.4 ?F (36.9 ?C) (04/09 0500) ?Pulse Rate:  [65-115] 82 (04/09 0700) ?Resp:  [13-27] 22 (04/09 0700) ?BP: (102-146)/(70-107) 112/76 (04/09 0700) ?SpO2:  [93 %-100 %] 97 % (04/09 0700) ?FiO2 (%):  [40 %] 40 % (04/08 1427) ?Weight:  [63.8 kg] 63.8 kg (04/09 0500) ? ?Intake/Output from previous day: ?04/08 0701 - 04/09 0700 ?In: 668.8 [I.V.:145.5; IV Piggyback:523.3] ?Out: 800 [Urine:800] ?Intake/Output this shift: ?No intake/output data recorded. ?Nutritional status:  ?Diet Order   ? ?       ?  Diet NPO time specified  Diet effective now       ?  ? ?  ?  ? ?  ? ?HEENT: EEG leads in place ?Lungs: Respirations unlabored ?Ext: No edema ? ?Neurologic Exam: ?Ment: Awake and alert. Oriented x 5. Speech fluent with intact comprehension.  ?CN: EOMI. Face symmetric. Phonation with mild hoarseness post-extubation.  ?Motor: 5/5 x 4 ?Sensory: Grossly intact x 4 ?Cerebellar: No ataxia noted.  ? ?Lab Results: ?Results for orders placed or performed during the hospital encounter of 08/10/21 (from the past 48 hour(s))  ?MRSA Next Gen by PCR, Nasal     Status: None  ? Collection Time: 08/10/21  5:42 PM  ? Specimen: Nasal Mucosa; Nasal Swab  ?Result Value Ref Range  ? MRSA by PCR Next Gen NOT DETECTED NOT DETECTED  ?  Comment: (NOTE) ?The GeneXpert MRSA Assay (FDA approved for NASAL specimens only), ?is one component of a comprehensive MRSA colonization surveillance ?program. It is not intended to diagnose MRSA infection nor to guide ?or monitor treatment for MRSA infections. ?Test performance is not FDA approved in patients less than 2 years ?old. ?Performed at [facility], [place] [address] [place], [place] ?[address] ?  ?I-STAT 7, (LYTES,  BLD GAS, ICA, H+H)     Status: None  ? Collection Time: 08/10/21  6:20 PM  ?Result Value Ref Range  ? pH, Arterial 7.434 7.35 - 7.45  ? pCO2 arterial 37.1 32 - 48 mmHg  ? pO2, Arterial 99 83 - 108 mmHg  ? Bicarbonate 24.9 20.0 - 28.0 mmol/L  ? TCO2 26 22 - 32 mmol/L  ? O2 Saturation 98 %  ? Acid-Base Excess 1.0 0.0 - 2.0 mmol/L  ? Sodium 140 135 - 145 mmol/L  ? Potassium 3.7 3.5 - 5.1 mmol/L  ? Calcium, Ion 1.26 1.15 - 1.40 mmol/L  ? HCT 40.0 39.0 - 52.0 %  ? Hemoglobin 13.6 13.0 - 17.0 g/dL  ? Patient temperature 98.1 F   ? Collection site RADIAL, ALLEN'S TEST ACCEPTABLE   ? Drawn by RT   ? Sample type ARTERIAL   ?HIV Antibody (routine testing w rflx)     Status: None  ? Collection Time: 08/11/21  5:08 AM  ?Result Value Ref Range  ? HIV Screen 4th Generation wRfx Non Reactive Non Reactive  ?  Comment: Performed at [facility], [place] [address], [place], [place] [address]  ?CBC     Status: Abnormal  ? Collection Time: 08/11/21  5:08 AM  ?Result Value Ref Range  ? WBC 10.9 (  H) 4.0 - 10.5 K/uL  ? RBC 4.66 4.22 - 5.81 MIL/uL  ? Hemoglobin 13.3 13.0 - 17.0 g/dL  ? HCT 39.3 39.0 - 52.0 %  ? MCV 84.3 80.0 - 100.0 fL  ? MCH 28.5 26.0 - 34.0 pg  ? MCHC 33.8 30.0 - 36.0 g/dL  ? RDW 17.4 (H) 11.5 - 15.5 %  ? Platelets 166 150 - 400 K/uL  ? nRBC 0.0 0.0 - 0.2 %  ?  Comment: Performed at [facility], [place] [address] [place], [place] [address]  ?Basic metabolic panel     Status: None  ? Collection Time: 08/11/21  5:08 AM  ?Result Value Ref Range  ? Sodium 140 135 - 145 mmol/L  ? Potassium 3.9 3.5 - 5.1 mmol/L  ? Chloride 108 98 - 111 mmol/L  ? CO2 24 22 - 32 mmol/L  ? Glucose, Bld 94 70 - 99 mg/dL  ?  Comment: Glucose reference range applies only to samples taken after fasting for at least 8 hours.  ? BUN 11 6 - 20 mg/dL  ? Creatinine, Ser 0.97 0.61 - 1.24 mg/dL  ? Calcium 9.1 8.9 - 10.3 mg/dL  ? GFR, Estimated >60 >60 mL/min  ?  Comment: (NOTE) ?Calculated using the CKD-EPI Creatinine Equation (2021) ?  ? Anion gap  8 5 - 15  ?  Comment: Performed at [facility], [place] [address], [place], [place] [address]  ?Triglycerides     Status: None  ? Collection Time: 08/11/21  5:08 AM  ?Result Value Ref Range  ? Triglycerides 48 <150 mg/dL  ?  Comment: Performed at [facility], [place] [address], [place], [place] [address]  ? ? ?Recent Results (from the past 240 hour(s))  ?Resp Panel by RT-PCR (Flu A&B, Covid) Nasopharyngeal Swab     Status: None  ? Collection Time: 08/10/21  2:16 PM  ? Specimen: Nasopharyngeal Swab; Nasopharyngeal(NP) swabs in vial transport medium  ?Result Value Ref Range Status  ? SARS Coronavirus 2 by RT PCR NEGATIVE NEGATIVE Final  ?  Comment: (NOTE) ?SARS-CoV-2 target nucleic acids are NOT DETECTED. ? ?The SARS-CoV-2 RNA is generally detectable in upper respiratory ?specimens during the acute phase of infection. The lowest ?concentration of SARS-CoV-2 viral copies this assay can detect is ?138 copies/mL. A negative result does not preclude SARS-Cov-2 ?infection and should not be used as the sole basis for treatment or ?other patient management decisions. A negative result may occur with  ?improper specimen collection/handling, submission of specimen other ?than nasopharyngeal swab, presence of viral mutation(s) within the ?areas targeted by this assay, and inadequate number of viral ?copies(<138 copies/mL). A negative result must be combined with ?clinical observations, patient history, and epidemiological ?information. The expected result is Negative. ? ?Fact Sheet for Patients:  ?EntrepreneurPulse.com.au ? ?Fact Sheet for Healthcare Providers:  ?IncredibleEmployment.be ? ?This test is no t yet approved or cleared by the [place] FDA and  ?has been authorized for detection and/or diagnosis of SARS-CoV-2 by ?FDA under an Emergency Use Authorization (EUA). This EUA will remain  ?in effect (meaning this test can be used) for the duration of the ?COVID-19 declaration  under Section 564(b)(1) of the Act, 21 ?U.S.C.section 360bbb-3(b)(1), unless the authorization is terminated  ?or revoked sooner.  ? ? ?  ? Influenza A by PCR NEGATIVE NEGATIVE Final  ? Influenza B by PCR NEGATIVE NEGATIVE Final  ?  Comment: (NOTE) ?The Xpert Xpress SARS-CoV-2/FLU/RSV plus assay is intended as  an aid ?in the diagnosis of influenza from Nasopharyngeal swab specimens and ?should not be used as a sole basis for treatment. Nasal washings and ?aspirates are unacceptable for Xpert Xpress SARS-CoV-2/FLU/RSV ?testing. ? ?Fact Sheet for Patients: ?EntrepreneurPulse.com.au ? ?Fact Sheet for Healthcare Providers: ?IncredibleEmployment.be ? ?This test is not yet approved or cleared by the [place] FDA and ?has been authorized for detection and/or diagnosis of SARS-CoV-2 by ?FDA under an Emergency Use Authorization (EUA). This EUA will remain ?in effect (meaning this test can be used) for the duration of the ?COVID-19 declaration under Section 564(b)(1) of the Act, 21 U.S.C. ?section 360bbb-3(b)(1), unless the authorization is terminated or ?revoked. ? ?Performed at [facility], [place], ?[place] [address] ?  ?Blood Culture (routine x 2)     Status: None (Preliminary result)  ? Collection Time: 08/10/21  2:18 PM  ? Specimen: BLOOD  ?Result Value Ref Range Status  ? Specimen Description BLOOD LEFT ANTECUBITAL  Final  ? Special Requests   Final  ?  BOTTLES DRAWN AEROBIC AND ANAEROBIC Blood Culture adequate volume  ? Culture  Setup Time   Final  ?  GRAM NEGATIVE RODS ?AEROBIC BOTTLE ONLY ?CRITICAL VALUE NOTED.  VALUE IS CONSISTENT WITH PREVIOUSLY REPORTED AND CALLED VALUE. ?Performed at [facility], [address] [place], [place] [address] ?  ? Culture GRAM NEGATIVE RODS  Final  ? Report Status PENDING  Incomplete  ?Blood Culture (routine x 2)     Status: None (Preliminary result)  ? Collection Time: 08/10/21  2:23 PM  ? Specimen: BLOOD   ?Result Value Ref Range Status  ? Specimen Description BLOOD RIGHT ANTECUBITAL  Final  ? Special Requests   Final  ?  BOTTLES DRAWN AEROBIC AND ANAEROBIC Blood Culture adequate volume  ? Culture  Setup Ti

## 2021-08-12 NOTE — Procedures (Addendum)
Patient Name: Luis Hunter  ?MRN: [id number]  ?Epilepsy Attending: Lora Havens  ?Referring Physician/Provider: August Albino, NP ?Duration: 08/11/2021 2057 to 08/12/2021 1027 ?  ?Patient history: 46 y.o. male with past medical history of seizure disorder (no prior visits or care everywhere notes) who presented from his group home for evaluation of multiple seizures today. EEG to evaluate for seizure ?  ?Level of alertness: comatose-->awake,asleep ?  ?AEDs during EEG study: LEV, propofol ?  ?Technical aspects: This EEG study was done with scalp electrodes positioned according to the 10-20 International system of electrode placement. Electrical activity was acquired at a sampling rate of '500Hz'$  and reviewed with a high frequency filter of '70Hz'$  and a low frequency filter of '1Hz'$ . EEG data were recorded continuously and digitally stored.  ?  ?Description:  The posterior dominant rhythm consists of 8-9 Hz activity of moderate voltage (25-35 uV) seen predominantly in posterior head regions, symmetric and reactive to eye opening and eye closing. Sleep was characterized by vertex waves, sleep spindles (12 to 14 Hz), maximal frontocentral region. Hyperventilation and photic stimulation were not performed.    ?  ?IMPRESSION: ?This study is within normal limits. No seizures or epileptiform discharges were seen throughout the recording. ?  ?Lora Havens  ?

## 2021-08-12 NOTE — Progress Notes (Signed)
? ?NAME:  Luis Hunter, MRN:  [id number], DOB:  02-Dec-1975, LOS: 2 ?ADMISSION DATE:  08/10/2021, CONSULTATION DATE:  08/10/2021 ?REFERRING MD:  Dr. Carren Rang, neurology CHIEF COMPLAINT:  Status   ? ?History of Present Illness:  ?Luis Hunter is a 46 y.o. male who presented as a [name] after he was found down at a boardinghouse after suffering multiple seizures. Per EMS patient may be in a maintanve AED but unsure if patient is compliant. Per report patient had 3 seizures prior to EMS arrival and had continual seizure en route despite '6mg'$  of versed. He was given an additional '8mg'$  of ativan on ED arrival and later intubated with etomidate and rocuronium.  ? ?Neurology assessed patient at [facility] and requested transfer to [facility] for further management and need for continuous EEG ? ?Pertinent  Medical History  ?Seizure disorder ? ?Significant Hospital Events: ?Including procedures, antibiotic start and stop dates in addition to other pertinent events   ?4/7 admitted with status epilepticus ?4/7 blood cultures positive for ESBL E. Coli ?4/8 extubated ? ?Interim History / Subjective:  ?No further seizures. This morning passed swallow eval. Alert and and calm.  ? ?Objective   ?Blood pressure (!) 145/90, pulse 89, temperature 98.4 ?F (36.9 ?C), resp. rate 19, height 6' (1.829 m), weight 63.8 kg, SpO2 94 %. ?   ?Vent Mode: PSV;CPAP ?FiO2 (%):  [40 %] 40 % ?PEEP:  [5 cmH20] 5 cmH20 ?Pressure Support:  [5 cmH20-8 cmH20] 5 cmH20  ? ?Intake/Output Summary (Last 24 hours) at 08/12/2021 1030 ?Last data filed at 08/12/2021 0600 ?Gross per 24 hour  ?Intake 646.13 ml  ?Output 800 ml  ?Net -153.87 ml  ? ?Filed Weights  ? 08/11/21 0500 08/12/21 0500  ?Weight: 63.5 kg 63.8 kg  ? ? ?Examination: ?Awake, alert no distress ?Normal speech ?Moves all 4 extremities ?Developmental delay obvious with ongoing conversation.  ? ? ? ?Resolved Hospital Problem list   ? ? ?Assessment & Plan:  ? ?Status epilepticus ?Seizure disorder, with likely non-adherence to  medications ?- continue keppra.  ?- extubated 4/8 without issues ? ?ESBL E. Coli E. Coli and Bacteremia ?- continue meropenem ? ?He is ok for transfer to [facility]. Will signout to [facility] to assume care 4/10.  ? ?Best Practice (right click and "Reselect all SmartList Selections" daily)  ? ?Diet/type: Regular consistency (see orders) ?DVT prophylaxis: prophylactic heparin  ?GI prophylaxis: n/a ?Lines: N/A ?Foley:  N/A ?Code Status:  full code ?Last date of multidisciplinary goals of care discussion [Will update daughter Lauralyn Primes today by phone [phone] ? ?Labs   ?CBC: ?Recent Labs  ?Lab 08/10/21 ?1359 08/10/21 ?1820 08/11/21 ?9833  ?WBC 8.0  --  10.9*  ?HGB 14.0 13.6 13.3  ?HCT 43.8 40.0 39.3  ?MCV 87.1  --  84.3  ?PLT 183  --  166  ? ? ?Basic Metabolic Panel: ?Recent Labs  ?Lab 08/10/21 ?1359 08/10/21 ?1820 08/11/21 ?8250  ?NA 141 140 140  ?K 3.6 3.7 3.9  ?CL 105  --  108  ?CO2 17*  --  24  ?GLUCOSE 96  --  94  ?BUN 12  --  11  ?CREATININE 1.05  --  0.97  ?CALCIUM 9.3  --  9.1  ? ?GFR: ?Estimated Creatinine Clearance: 85 mL/min (by C-G formula based on SCr of 0.97 mg/dL). ?Recent Labs  ?Lab 08/10/21 ?1359 08/11/21 ?5397  ?WBC 8.0 10.9*  ? ? ?Liver Function Tests: ?Recent Labs  ?Lab 08/10/21 ?1359  ?AST 34  ?ALT 13  ?ALKPHOS  71  ?BILITOT 0.6  ?PROT 7.1  ?ALBUMIN 4.0  ? ?No results for input(s): LIPASE, AMYLASE in the last 168 hours. ?No results for input(s): AMMONIA in the last 168 hours. ? ?ABG ?   ?Component Value Date/Time  ? PHART 7.434 08/10/2021 1820  ? PCO2ART 37.1 08/10/2021 1820  ? PO2ART 99 08/10/2021 1820  ? HCO3 24.9 08/10/2021 1820  ? TCO2 26 08/10/2021 1820  ? ACIDBASEDEF 0.7 08/10/2021 1415  ? O2SAT 98 08/10/2021 1820  ?  ? ? ?

## 2021-08-12 NOTE — Progress Notes (Signed)
Bed ready status. Will transfer to 3W. Report given to [facility]. Called to notify on the way with patient.  ?

## 2021-08-12 NOTE — Progress Notes (Signed)
LTM EEG discontinued - no skin breakdown at unhook.   

## 2021-08-13 ENCOUNTER — Inpatient Hospital Stay (HOSPITAL_COMMUNITY): Payer: Medicare HMO

## 2021-08-13 DIAGNOSIS — R7881 Bacteremia: Principal | ICD-10-CM

## 2021-08-13 DIAGNOSIS — G40901 Epilepsy, unspecified, not intractable, with status epilepticus: Secondary | ICD-10-CM | POA: Diagnosis not present

## 2021-08-13 DIAGNOSIS — R911 Solitary pulmonary nodule: Secondary | ICD-10-CM

## 2021-08-13 LAB — CULTURE, BLOOD (ROUTINE X 2)
Special Requests: ADEQUATE
Special Requests: ADEQUATE

## 2021-08-13 LAB — URINALYSIS, COMPLETE (UACMP) WITH MICROSCOPIC
Bilirubin Urine: NEGATIVE
Glucose, UA: NEGATIVE mg/dL
Ketones, ur: 5 mg/dL — AB
Leukocytes,Ua: NEGATIVE
Nitrite: NEGATIVE
Protein, ur: 30 mg/dL — AB
RBC / HPF: >50 RBC/hpf — ABNORMAL HIGH (ref 0–5)
Specific Gravity, Urine: 1.026 (ref 1.005–1.030)
pH: 6 (ref 5.0–8.0)

## 2021-08-13 LAB — GLUCOSE, CAPILLARY: Glucose-Capillary: 109 mg/dL — ABNORMAL HIGH (ref 70–99)

## 2021-08-13 MED ORDER — IOHEXOL 300 MG/ML  SOLN
100.0000 mL | Freq: Once | INTRAMUSCULAR | Status: AC | PRN
  Administered 2021-08-13: 100 mL via INTRAVENOUS

## 2021-08-13 NOTE — Progress Notes (Signed)
?Progress Note ? ? ?Patient: Luis Hunter WTU:[id number] DOB: April 05, 1976 DOA: 08/10/2021     3 ?DOS: the patient was seen and examined on 08/13/2021 at 3:26 PM ?  ? ? ? ?Brief hospital course: ?Luis Hunter is a 46 y.o. M with hx seizures, polysubstance abuse, Cardiomyopathy and sCHF EF 40% more recently improved to 50-55%, and schizoaffective disorder who presented from a boarding house for evaluation of multiple seizures.  ? ?Evidently, pt had 3 seizures prior to EMS arrival and was noted to have tonic-clonic seizing throughout EMS transport. EMS gave 6 mg of Versed without resolution of seizures.  ? ?In [facility] ED, because he was having status epilepticus, he was given additional 4 mg Ativan x 2, then intubated, and clinical seizure activity ceased after intubation with propofol and load of Keppra, but during transfer from [facility] to [facility], EMS noted some twitching requiring a bolus of propofol.  ? ?4/7: Transferred from [facility] for status epilepticus; blood cultures growing ESBL E. Coli ?4/8 extubated ? ? ? ? ?Assessment and Plan: ?* Bacteremia ?ESBL bacteremia, unclear source ?- CT abdomen and pelvis ?- Continue meropenem, day 3 ?- Consult ID, appreciate cares ? ?Status epilepticus ([place]) ?Presented in status epilepticus to [facility].  Intubated and transferred to [facility] with spot EEG here initially showing no seizures on propofol.   ? ?Reported running out of Keppra prior to admission and missing several doses.  LTM EEG here showed no seizures or epileptiform discharges throughout the recording. ? ?Breakthrough seizure was most likely due to a combination of anticonvulsant noncompliance together with cocaine and marijuana use. ? ?-Substance use cessation ?- Continue Keppra ? ? ? ? ? ? ? ? ? ?Subjective: Patient feels well.  He has no abdominal pain, no cough, no confusion, no dysuria and had none of the symptoms prior to admission either.  He feels well, no seizures overnight, no fever today.  Eating and drinking  well ? ? ? ? ?Physical Exam: ?Vitals:  ? 08/12/21 2015 08/13/21 0800 08/13/21 1112 08/13/21 1507  ?BP: 132/89 118/71 125/85 129/89  ?Pulse: 77 72 75 67  ?Resp:  '20 20 20  '$ ?Temp: 98.2 ?F (36.8 ?C) 98.2 ?F (36.8 ?C) 98.8 ?F (37.1 ?C) 99.1 ?F (37.3 ?C)  ?TempSrc:  Oral Oral Oral  ?SpO2: 95% 98% 99% 100%  ?Weight:      ?Height:      ? ?Thin adult male, appears unkempt, but oriented and appropriate in conversation. ?RRR, no murmurs, no lower extremity edema ?Lungs clear without rales or wheezes, good air movement, normal respiratory rate. ?Abdomen exam normal, he has some old wounds in the abdomen, but no stigmata of liver disease, no swelling or ascites, no masses, no hepatosplenomegaly, no point tenderness or guarding anywhere. ?Attention normal, affect blunted, judgment insight appear mildly impaired but at baseline, moves all extremities with normal strength and coordination, speech fluent, face symmetric, mostly edentulous.  Slightly dysarthric but seems to be at baseline. ? ? ? ? ? ? ?Data Reviewed: ?Case discussed with infectious disease.  Nursing notes reviewed, previous critical care notes and neurology notes reviewed.  Vital signs reviewed. ?UDS positive for cocaine, benzodiazepines, and THC ?Basic metabolic panel and complete blood count from 2 days ago was unremarkable.  EEG normal.  CT head and C-spine admission normal. ? ?Family Communication:   ? ? ? ?Disposition: ?Status is: Inpatient ?The patient was admitted for status epilepticus.  This is resolved.  However he was also found to have an extended spectrum  beta-lactamase producing E. coli in his bloodstream which is a serious and life-threatening infection. ? ?This will require ID consultation for appropriate tailoring of antibiotic course. ? ?Once ID has finalized their antibiotic regimen, we will know if he can be treated at home or Must stay in the hospital for completion of antibiotic course ? ? ? ? ? ? ? ?Author: ?Edwin Dada,  MD ?08/13/2021 4:06 PM ? ?For on call review [email or web address].  ? ? ?
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
## 2021-08-13 NOTE — Hospital Course (Addendum)
Luis Hunter is a 46 y.o. M with hx seizures, polysubstance abuse, Cardiomyopathy and sCHF EF 40% more recently improved to 50-55%, and schizoaffective disorder who presented from a boarding house for evaluation of multiple seizures.  ? ?Evidently, pt had 3 seizures prior to EMS arrival and was noted to have tonic-clonic seizing throughout EMS transport. EMS gave 6 mg of Versed without resolution of seizures.  ? ?In [facility] ED, because he was having status epilepticus, he was given additional 4 mg Ativan x 2, then intubated, and clinical seizure activity ceased after intubation with propofol and load of Keppra, but during transfer from [facility] to [facility], EMS noted some twitching requiring a bolus of propofol.  ? ?4/7: Transferred from [facility] for status epilepticus; blood cultures growing ESBL E. Coli ?4/8 extubated ?

## 2021-08-13 NOTE — TOC Initial Note (Signed)
Transition of Care (TOC) - Initial/Assessment Note  ? ? ?Patient Details  ?Name: Luis Hunter ?MRN: [id number] ?Date of Birth: 11-15-1975 ? ?Transition of Care (TOC) CM/SW Contact:    ?Pollie Friar, RN ?Phone Number: ?08/13/2021, 11:04 AM ? ?Clinical Narrative:                 ?Pt lives in a boarding house in [place]. [facility]. CM inquired about missing his medications and he states he walks to the pharmacy and it was raining too hard for him to go. CM inquired if he would be ok with the pharmacy making deliveries of his medications. Pt was interested.  ?CM called [facility] and they do deliver mediations in the city limits. Pharmacy states the patient would just need to set that up with his next medications sent to them. CM will assist the patient with this once his d/c meds are sent to [facility].  ?Pt denies any DME at home. He states he has a friend that assists with transportation or he walks.  ?TOC following. ? ?Expected Discharge Plan: Home/Self Care ?Barriers to Discharge: Continued Medical Work up ? ? ?Patient Goals and CMS Choice ?  ?  ?  ? ?Expected Discharge Plan and Services ?Expected Discharge Plan: Home/Self Care ?  ?Discharge Planning Services: CM Consult ?  ?Living arrangements for the past 2 months: [place] ?                ?  ?  ?  ?  ?  ?  ?  ?  ?  ?  ? ?Prior Living Arrangements/Services ?Living arrangements for the past 2 months: [place] ?Lives with:: Self ?Patient language and need for interpreter reviewed:: Yes ?Do you feel safe going back to the place where you live?: Yes      ?  ?  ?  ?Criminal Activity/Legal Involvement Pertinent to Current Situation/Hospitalization: No - Comment as needed ? ?Activities of Daily Living ?  ?  ? ?Permission Sought/Granted ?  ?  ?   ?   ?   ?   ? ?Emotional Assessment ?Appearance:: Appears stated age ?Attitude/Demeanor/Rapport: Engaged ?Affect (typically observed): Accepting ?Orientation:  : Oriented to Self, Oriented to Place, Oriented to  Time, Oriented to Situation ?  ?Psych Involvement: No (comment) ? ?Admission diagnosis:  Status epilepticus ([place]) [G40.901] ?Patient Active Problem List  ? Diagnosis Date Noted  ? Bacteremia 08/13/2021  ? Status epilepticus ([place]) 08/10/2021  ? ?PCP:  [facility] [place] ?Pharmacy:   ?[place] ?[place] [address] ?Phone: [phone] Fax: [phone] ? ? ? ? ?Social Determinants of Health (SDOH) Interventions ?  ? ?Readmission Risk Interventions ?   ? View : No data to display.  ?  ?  ?  ? ? ? ?

## 2021-08-13 NOTE — Assessment & Plan Note (Signed)
ESBL bacteremia, unclear source ?- CT abdomen and pelvis ?- Continue meropenem, day 3 ?- Consult ID, appreciate cares ?

## 2021-08-13 NOTE — Assessment & Plan Note (Addendum)
Presented in status epilepticus to [facility].  Intubated and transferred to [facility] with spot EEG here initially showing no seizures on propofol.   ? ?Reported running out of Keppra prior to admission and missing several doses.  LTM EEG here showed no seizures or epileptiform discharges throughout the recording. ? ?Breakthrough seizure was most likely due to a combination of anticonvulsant noncompliance together with cocaine and marijuana use. ? ?-Substance use cessation ?- Continue Keppra ?

## 2021-08-14 ENCOUNTER — Other Ambulatory Visit (HOSPITAL_COMMUNITY): Payer: Self-pay

## 2021-08-14 DIAGNOSIS — R911 Solitary pulmonary nodule: Secondary | ICD-10-CM | POA: Diagnosis not present

## 2021-08-14 DIAGNOSIS — R7881 Bacteremia: Secondary | ICD-10-CM | POA: Diagnosis not present

## 2021-08-14 DIAGNOSIS — G40901 Epilepsy, unspecified, not intractable, with status epilepticus: Secondary | ICD-10-CM | POA: Diagnosis not present

## 2021-08-14 LAB — URINE CULTURE: Culture: NO GROWTH

## 2021-08-14 LAB — CBC
HCT: 40.7 % (ref 39.0–52.0)
Hemoglobin: 13.5 g/dL (ref 13.0–17.0)
MCH: 28.1 pg (ref 26.0–34.0)
MCHC: 33.2 g/dL (ref 30.0–36.0)
MCV: 84.8 fL (ref 80.0–100.0)
Platelets: 155 10*3/uL (ref 150–400)
RBC: 4.8 MIL/uL (ref 4.22–5.81)
RDW: 16.7 % — ABNORMAL HIGH (ref 11.5–15.5)
WBC: 11.2 10*3/uL — ABNORMAL HIGH (ref 4.0–10.5)
nRBC: 0 % (ref 0.0–0.2)

## 2021-08-14 LAB — COMPREHENSIVE METABOLIC PANEL
ALT: 15 U/L (ref 0–44)
AST: 18 U/L (ref 15–41)
Albumin: 3.5 g/dL (ref 3.5–5.0)
Alkaline Phosphatase: 60 U/L (ref 38–126)
Anion gap: 5 (ref 5–15)
BUN: 10 mg/dL (ref 6–20)
CO2: 27 mmol/L (ref 22–32)
Calcium: 9.1 mg/dL (ref 8.9–10.3)
Chloride: 105 mmol/L (ref 98–111)
Creatinine, Ser: 0.65 mg/dL (ref 0.61–1.24)
GFR, Estimated: >60 mL/min (ref >60)
Glucose, Bld: 102 mg/dL — ABNORMAL HIGH (ref 70–99)
Potassium: 3.7 mmol/L (ref 3.5–5.1)
Sodium: 137 mmol/L (ref 135–145)
Total Bilirubin: 0.6 mg/dL (ref 0.3–1.2)
Total Protein: 6.4 g/dL — ABNORMAL LOW (ref 6.5–8.1)

## 2021-08-14 MED ORDER — LEVETIRACETAM 750 MG PO TABS
750.0000 mg | ORAL_TABLET | Freq: Two times a day (BID) | ORAL | 0 refills | Status: DC
  Filled 2021-08-14: qty 16, 8d supply, fill #0

## 2021-08-14 MED ORDER — LEVETIRACETAM 750 MG PO TABS
1500.0000 mg | ORAL_TABLET | Freq: Two times a day (BID) | ORAL | 0 refills | Status: DC
  Filled 2021-08-14 (×2): qty 30, 8d supply, fill #0

## 2021-08-14 MED ORDER — SULFAMETHOXAZOLE-TRIMETHOPRIM 800-160 MG PO TABS: 1.0000 | ORAL_TABLET | Freq: Two times a day (BID) | ORAL | Status: DC

## 2021-08-14 MED ORDER — LEVETIRACETAM 750 MG PO TABS: 1500.0000 mg | ORAL_TABLET | Freq: Two times a day (BID) | ORAL | 3 refills | Status: DC

## 2021-08-14 MED ORDER — SULFAMETHOXAZOLE-TRIMETHOPRIM 800-160 MG PO TABS
1.0000 | ORAL_TABLET | Freq: Two times a day (BID) | ORAL | 0 refills | Status: DC
Start: 2021-08-14 — End: 2022-11-19
  Filled 2021-08-14: qty 9, 5d supply, fill #0

## 2021-08-14 NOTE — Progress Notes (Signed)
?   ? ?[facility] for Infectious Disease ? ?Date of Admission:  08/10/2021    ?       ?Reason for visit: Follow up on ESBL Bacteremia ?  ?Current antibiotics: ?Meropenem 4/8 - present ? ? ? ?ASSESSMENT:   ? ?46 y.o. male admitted with: ? ?ESBL bacteremia of unclear etiology: CT with contrast of the abdomen/pelvis yesterday did not reveal a source of his bacteremia and was otherwise fairly unremarkable.  He otherwise has no localizing symptoms and UA on admission was not consistent with infection.  Chest x-ray also did not show any evidence of pneumonia and he has no respiratory complaints.  He has otherwise improved on empiric therapy with meropenem given the presence of ESBL bacteremia. ?Seizure disorder ? ?RECOMMENDATIONS:   ? ?Will transition from meropenem to TMP-SMX ?Plan for 7 days total duration ending on 4/15 ?Discussed with primary ?Will sign off, please call as needed ? ? ?Principal Problem: ?  Bacteremia ?Active Problems: ?  Status epilepticus ([place]) ?  Lung nodule ? ? ? ?MEDICATIONS:   ? ?Scheduled Meds: ? Chlorhexidine Gluconate Cloth  6 each Topical Q0600  ? heparin  5,000 Units Subcutaneous Q8H  ? levETIRAcetam  1,500 mg Oral BID  ? sulfamethoxazole-trimethoprim  1 tablet Oral Q12H  ? ?Continuous Infusions: ? ? ?PRN Meds:.acetaminophen (TYLENOL) oral liquid 160 mg/5 mL, docusate sodium, polyethylene glycol ? ?SUBJECTIVE:  ? ?24 hour events:  ?No acute events have been noted overnight ?He has been afebrile with a Tmax of 99.1 ?Lab work today is stable with WBC 11.2, unremarkable CMP ?CT abdomen/pelvis was obtained yesterday which showed no source of his bacteremia ? ?Patient reports no acute complaints.  Denies fevers or chills.  No GI symptoms, no urinary symptoms.  Reports that he is hopeful to be discharged back to his boardinghouse today. ? ?Review of Systems  ?All other systems reviewed and are negative. ? ?  ?OBJECTIVE:  ? ?Blood pressure (!) 132/93, pulse 73, temperature 98.2 ?F (36.8 ?C),  temperature source Oral, resp. rate 18, height 6' (1.829 m), weight 63.8 kg, SpO2 99 %. ?Body mass index is 19.08 kg/m?. ? ?Physical Exam ?Constitutional:   ?   General: He is not in acute distress. ?   Appearance: Normal appearance.  ?HENT:  ?   Head: Normocephalic and atraumatic.  ?Eyes:  ?   Extraocular Movements: Extraocular movements intact.  ?   Conjunctiva/sclera: Conjunctivae normal.  ?Cardiovascular:  ?   Rate and Rhythm: Normal rate and regular rhythm.  ?Pulmonary:  ?   Effort: Pulmonary effort is normal. No respiratory distress.  ?   Breath sounds: Normal breath sounds.  ?Abdominal:  ?   General: There is no distension.  ?   Palpations: Abdomen is soft.  ?   Tenderness: There is no abdominal tenderness.  ?Musculoskeletal:     ?   General: Normal range of motion.  ?   Cervical back: Normal range of motion and neck supple.  ?Skin: ?   General: Skin is warm and dry.  ?Neurological:  ?   General: No focal deficit present.  ?   Mental Status: He is alert and oriented to person, place, and time.  ?Psychiatric:     ?   Mood and Affect: Mood normal.     ?   Behavior: Behavior normal.  ? ? ? ?Lab Results: ?Lab Results  ?Component Value Date  ? WBC 11.2 (H) 08/14/2021  ? HGB 13.5 08/14/2021  ? HCT 40.7  08/14/2021  ? MCV 84.8 08/14/2021  ? PLT 155 08/14/2021  ?  ?Lab Results  ?Component Value Date  ? NA 137 08/14/2021  ? K 3.7 08/14/2021  ? CO2 27 08/14/2021  ? GLUCOSE 102 (H) 08/14/2021  ? BUN 10 08/14/2021  ? CREATININE 0.65 08/14/2021  ? CALCIUM 9.1 08/14/2021  ? GFRNONAA >60 08/14/2021  ?  ?Lab Results  ?Component Value Date  ? ALT 15 08/14/2021  ? AST 18 08/14/2021  ? ALKPHOS 60 08/14/2021  ? BILITOT 0.6 08/14/2021  ? ? ?No results found for: CRP ? ?No results found for: ESRSEDRATE ?  ?I have reviewed the micro and lab results in Epic. ? ?Imaging: ?CT ABDOMEN PELVIS W CONTRAST ? ?Result Date: 08/13/2021 ?CLINICAL DATA:  Sepsis EXAM: CT ABDOMEN AND PELVIS WITH CONTRAST TECHNIQUE: Multidetector CT imaging of the  abdomen and pelvis was performed using the standard protocol following bolus administration of intravenous contrast. RADIATION DOSE REDUCTION: This exam was performed according to the departmental dose-optimization program which includes automated exposure control, adjustment of the mA and/or kV according to patient size and/or use of iterative reconstruction technique. CONTRAST:  138m OMNIPAQUE IOHEXOL 300 MG/ML  SOLN COMPARISON:  CT examination dated January 09, 2021 FINDINGS: Lower chest: 6 mm nodular opacity along the right major fissure. Bibasilar subsegmental linear atelectasis. Hepatobiliary: Hypodensity in the right hepatic lobe abutting the dome of the diaphragm, likely representing a hemangioma or a cyst. No gallstones, gallbladder wall thickening, or biliary dilatation. Pancreas: Unremarkable. No pancreatic ductal dilatation or surrounding inflammatory changes. Spleen: Normal in size without focal abnormality. Adrenals/Urinary Tract: Adrenal glands are unremarkable. Kidneys are normal, without renal calculi, focal lesion, or hydronephrosis. Bladder is unremarkable. Stomach/Bowel: Stomach is within normal limits. Appendix not visualized, however no inflammatory changes in the pericecal region. No evidence of bowel wall thickening, distention, or inflammatory changes. Vascular/Lymphatic: No significant vascular findings are present. No enlarged abdominal or pelvic lymph nodes. Reproductive: Prostate is unremarkable. Other: No abdominal wall hernia or abnormality. No abdominopelvic ascites. Musculoskeletal: No acute or significant osseous findings. IMPRESSION: 1. 6 mm nodular density along the right major fissure. If the patient is at high risk for bronchogenic carcinoma, follow-up chest CT at 6-12 months is recommended. If the patient is at low risk for bronchogenic carcinoma, follow-up chest CT at 12 months is recommended. This recommendation follows the consensus statement: Guidelines for Management of  Small Pulmonary Nodules Detected on CT Scans: A Statement from the [place] published in Radiology 2005;237:395-400. 2.  No CT evidence of acute abdominal/pelvic process. 3.  No evidence of nephrolithiasis or hydronephrosis. 4.  No evidence of colitis or diverticulitis. Electronically Signed   By: IKeane PoliceD.O.   On: 08/13/2021 17:32    ? ?Imaging independently reviewed in Epic.  ? ? ?AMignon Pine?[facility]Be[facility]for Infectious Disease ?[place]?[phone] ?08/14/2021, 10:31 AM ? ? ?

## 2021-08-14 NOTE — Plan of Care (Signed)
Goals met ?

## 2021-08-14 NOTE — Discharge Summary (Signed)
?Physician Discharge Summary ?  ?Patient: Jamarrius Salay MRN: [id number] DOB: 10-16-1975  ?Admit date:     08/10/2021  ?Discharge date: 08/14/21  ?Discharge Physician: Edwin Dada  ? ?PCP: [facility] [place]  ? ? ? ?Recommendations at discharge:  ?Follow-up with [facility] neurology in 1 week for status epilepticus ?Follow-up with PCP in 1 to 2 weeks for follow-up of ESBL E. coli bacteremia ?PLEASE REPEAT CHEST CT IN 6-12 MONTHS to re-evaluate lung nodule ? ? ? ? ? ?Discharge Diagnoses: ?Principal Problem: ?  Status epilepticus ?Active Problems: ?  ESBL Bacteremia ?  Lung nodule ? ?  ? ? ?Hospital Course: ?Mr. Berisha is a 46 y.o. M with hx seizures, polysubstance abuse, Cardiomyopathy and sCHF EF 40% more recently improved to 50-55%, and schizoaffective disorder who presented from a boarding house for evaluation of multiple seizures.  ? ?Evidently, pt had 3 seizures prior to EMS arrival and was noted to have tonic-clonic seizing throughout EMS transport. EMS gave 6 mg of Versed without resolution of seizures.  ? ?In [facility] ED, because he was having status epilepticus, he was given additional 4 mg Ativan x 2, then intubated, and clinical seizure activity ceased after intubation with propofol and load of Keppra.  ? ? ?* Status epilepticus ([place]) ?Presented in status epilepticus to [facility].  Intubated and transferred to [facility] with spot EEG here initially showing no seizures on propofol.   ? ?Reported running out of Keppra prior to admission and missing several doses.  LTM EEG here showed no seizures or epileptiform discharges throughout the recording. ? ?Breakthrough seizure was most likely due to a combination of anticonvulsant noncompliance together with cocaine and marijuana use. ?- Substance use cessation ?- Discharged on Keppra 1500 BID and close Neurology follow up ?- Case management arranged for delivery of his monthly Keppra ? ? ? ? ? ?ESBL Bacteremia ?ESBL bacteremia, unclear source.   Urinalysis on admission and CXR on admission without signs of infection. ? ?Patient's symptoms completely resolved and he felt clinically well.   ? ?CT abdomen pelvis with contrast obtained, no clear source.  ID consulted, recommended completion of 7 days antibiotics with Bactrim DS. ? ? ? ? ?  ? ? ? ? ? ?[facility], The Controlled Substances Registry was reviewed for this patient prior to discharge.  ? ?Consultants: Neurology, Critical Care, ID ?Procedures performed: CT head, CT C-spine, CT abdomen and pelvis, continuous EEG  ?Disposition: Home ? ? ?DISCHARGE MEDICATION: ?Allergies as of 08/14/2021   ? ?   Reactions  ? Lacosamide Other (See Comments)  ? Eosinophilia  ? Valproic Acid And Related Other (See Comments)  ? hyperannonemia  ? Lamictal [lamotrigine] Rash  ? ?  ? ?  ?Medication List  ?  ? ?STOP taking these medications   ? ?levETIRAcetam 100 MG/ML solution ?Commonly known as: KEPPRA ?Replaced by: levETIRAcetam 750 MG tablet ?  ? ?  ? ?TAKE these medications   ? ?ARIPiprazole ER 400 MG Prsy prefilled syringe ?Commonly known as: ABILIFY MAINTENA ?Inject 400 mg into the muscle every 28 (twenty-eight) days. ?  ?levETIRAcetam 750 MG tablet ?Commonly known as: Keppra ?Take 1 tablet (750 mg total) by mouth 2 (two) times daily. ?  ?levETIRAcetam 750 MG tablet ?Commonly known as: KEPPRA ?Take 2 tablets (1,500 mg total) by mouth 2 (two) times daily. ?Start taking on: August 20, 2021 ?Replaces: levETIRAcetam 100 MG/ML solution ?  ?sulfamethoxazole-trimethoprim 800-160 MG tablet ?Commonly known as: BACTRIM DS ?Take 1 tablet by  mouth every 12 (twelve) hours. ?  ? ?  ? ? Follow-up Information   ? ? [facility] Follow up.   ?Why: Call to see about arranging transportation for medical appointments. ?Contact information: ?[phone] ? ?  ?  ? ? [facility] [place]. Schedule an appointment as soon as possible for a visit in 1 week(s).   ?Specialty: General Practice ?Contact  information: ?[address] ?[place] [address] ?[phone] ? ? ?  ?  ? ? Wonda Cerise, PA-C. Schedule an appointment as soon as possible for a visit in 1 month(s).   ?Specialty: Physician Assistant ?Why: With [facility] Neurology, either Luella Cook or one of the other providers if she is not there ?Contact information: ?[address] ?[place] [address] ?[phone] ? ? ?  ?  ? ?  ?  ? ?  ? ? ?Discharge Instructions   ? ? Discharge instructions   Complete by: As directed ?  ? From Dr. Loleta Books: ?You were admitted for seizures. ?These were uncontrolled seizures that could have killed you. ?You HAVE to take Keppra every day.  Do not miss doses. ? ?You will leave the hospital with a week's supply of Keppra. ?[facility] will send you a refill within a week, and they will deliver monthly. ?Once you finish the Keppra pills that we send you from the hospital, you can start the ones delivered by the apothecary ? ?HOWEVER: you HAVE TO HAVE TO HAVE TO go see a neurology doctor in the office. ? ?Go see the [facility] Neurologists ?You saw Luella Cook in [place], call them today to schedule an appointment within a month ? ? ?ALSO: ?You had a blood stream infection here ?We treated with IV antibiotics, and now you can switch to pills ?Take the antibiotic pills Bactrim DS twice daily (one pill in morning, one pill at night) starting tonight Tuesday night ?Take them until they are gone ? ?Call the [facility] for an appointment  ? Increase activity slowly   Complete by: As directed ?  ? ?  ? ? ?Discharge Exam: ?Filed Weights  ? 08/11/21 0500 08/12/21 0500  ?Weight: 63.5 kg 63.8 kg  ? ? ?General: Pt is alert, awake, not in acute distress ?Cardiovascular: RRR, nl S1-S2, no murmurs appreciated.   No LE edema.   ?Respiratory: Normal respiratory rate and rhythm.  CTAB without rales or wheezes. ?Abdominal: Abdomen soft and non-tender.  No distension or HSM.    ?Neuro/Psych: Strength symmetric in upper and lower extremities.  Judgment and insight appear normal. ? ? ?Condition at discharge: good ? ?The results of significant diagnostics from this hospitalization (including imaging, microbiology, ancillary and laboratory) are listed below for reference.  ? ?Imaging Studies: ?CT HEAD WO CONTRAST (5MM) ? ?Result Date: 08/10/2021 ?CLINICAL DATA:  Seizure. EXAM: CT HEAD WITHOUT CONTRAST CT CERVICAL SPINE WITHOUT CONTRAST TECHNIQUE: Multidetector CT imaging of the head and cervical spine was performed following the standard protocol without intravenous contrast. Multiplanar CT image reconstructions of the cervical spine were also generated. RADIATION DOSE REDUCTION: This exam was performed according to the departmental dose-optimization program which includes automated exposure control, adjustment of the mA and/or kV according to patient size and/or use of iterative reconstruction technique. COMPARISON:  None. FINDINGS: CT HEAD FINDINGS Brain: No evidence of acute infarction, hemorrhage, hydrocephalus, extra-axial collection or mass lesion/mass effect. Vascular: No hyperdense vessel or unexpected calcification. Skull: Normal. Negative for fracture  or focal lesion. Sinuses/Orbits: No acute finding. Other: None. CT CERVICAL SPINE FINDINGS Alignment: Normal. Skull base and vertebrae: No acute fracture. No primary bone lesion or focal pathologic process. Soft tissues and spinal canal: No prevertebral fluid or swelling. No visible canal hematoma. Disc levels: Mild degenerative disc disease is noted at C5-6 and C6-7 with anterior osteophyte formation. Upper chest: Negative. Other: None. IMPRESSION: No acute intracranial abnormality seen. Mild multilevel degenerative disc disease. No acute abnormality seen. Electronically Signed   By: Marijo Conception M.D.   On: 08/10/2021 16:09  ? ?CT Cervical Spine Wo Contrast ? ?Result Date: 08/10/2021 ?CLINICAL DATA:  Seizure. EXAM: CT HEAD WITHOUT  CONTRAST CT CERVICAL SPINE WITHOUT CONTRAST TECHNIQUE: Multidetector CT imaging of the head and cervical spine was performed following the standard protocol without intravenous contrast. Multiplanar CT image reconstructio

## 2021-08-14 NOTE — TOC Transition Note (Signed)
Transition of Care (TOC) - CM/SW Discharge Note ? ? ?Patient Details  ?Name: Luis Hunter ?MRN: [id number] ?Date of Birth: 03-22-1976 ? ?Transition of Care (TOC) CM/SW Contact:  ?Pollie Friar, RN ?Phone Number: ?08/14/2021, 11:22 AM ? ? ?Clinical Narrative:    ?Patient discharging home with self care. CM assisted patient to call his pharmacy and set up home delivery of his medication. They will deliver the first time on April 17th. Pt will discharge today with a weeks worth of his Keppra and his antibiotics.  ?Pt states he has transport home.  ? ? ?Final next level of care: Home/Self Care ?Barriers to Discharge: No Barriers Identified ? ? ?Patient Goals and CMS Choice ?  ?  ?  ? ?Discharge Placement ?  ?           ?  ?  ?  ?  ? ?Discharge Plan and Services ?  ?Discharge Planning Services: CM Consult ?           ?  ?  ?  ?  ?  ?  ?  ?  ?  ?  ? ?Social Determinants of Health (SDOH) Interventions ?  ? ? ?Readmission Risk Interventions ?   ? View : No data to display.  ?  ?  ?  ? ? ? ? ? ?

## 2021-08-14 NOTE — Plan of Care (Signed)
?  Problem: Clinical Measurements: ?Goal: Ability to maintain clinical measurements within normal limits will improve ?Outcome: Adequate for Discharge ?Goal: Will remain free from infection ?Outcome: Adequate for Discharge ?Goal: Diagnostic test results will improve ?Outcome: Adequate for Discharge ?Goal: Respiratory complications will improve ?Outcome: Adequate for Discharge ?Goal: Cardiovascular complication will be avoided ?Outcome: Adequate for Discharge ?  ?Problem: Activity: ?Goal: Risk for activity intolerance will decrease ?Outcome: Adequate for Discharge ?  ?Problem: Nutrition: ?Goal: Adequate nutrition will be maintained ?Outcome: Adequate for Discharge ?  ?Problem: Education: ?Goal: Knowledge of General Education information will improve ?Description: Including pain rating scale, medication(s)/side effects and non-pharmacologic comfort measures ?Outcome: Adequate for Discharge ?  ?Problem: Health Behavior/Discharge Planning: ?Goal: Ability to manage health-related needs will improve ?Outcome: Adequate for Discharge ?  ?Problem: Coping: ?Goal: Level of anxiety will decrease ?Outcome: Adequate for Discharge ?  ?Problem: Elimination: ?Goal: Will not experience complications related to bowel motility ?Outcome: Adequate for Discharge ?Goal: Will not experience complications related to urinary retention ?Outcome: Adequate for Discharge ?  ?Problem: Pain Managment: ?Goal: General experience of comfort will improve ?Outcome: Adequate for Discharge ?  ?Problem: Safety: ?Goal: Ability to remain free from injury will improve ?Outcome: Adequate for Discharge ?  ?Problem: Skin Integrity: ?Goal: Risk for impaired skin integrity will decrease ?Outcome: Adequate for Discharge ?  ?Problem: Respiratory: ?Goal: Ability to maintain a clear airway and adequate ventilation will improve ?Outcome: Adequate for Discharge ?  ?Problem: Safety: ?Goal: Non-violent Restraint(s) ?Outcome: Adequate for Discharge ?  ?Problem:  Education: ?Goal: Expressions of having a comfortable level of knowledge regarding the disease process will increase ?Outcome: Adequate for Discharge ?  ?Problem: Coping: ?Goal: Ability to adjust to condition or change in health will improve ?Outcome: Adequate for Discharge ?Goal: Ability to identify appropriate support needs will improve ?Outcome: Adequate for Discharge ?  ?Problem: Health Behavior/Discharge Planning: ?Goal: Compliance with prescribed medication regimen will improve ?Outcome: Adequate for Discharge ?  ?Problem: Medication: ?Goal: Risk for medication side effects will decrease ?Outcome: Adequate for Discharge ?  ?Problem: Clinical Measurements: ?Goal: Complications related to the disease process, condition or treatment will be avoided or minimized ?Outcome: Adequate for Discharge ?Goal: Diagnostic test results will improve ?Outcome: Adequate for Discharge ?  ?Problem: Safety: ?Goal: Verbalization of understanding the information provided will improve ?Outcome: Adequate for Discharge ?  ?Problem: Self-Concept: ?Goal: Level of anxiety will decrease ?Outcome: Adequate for Discharge ?Goal: Ability to verbalize feelings about condition will improve ?Outcome: Adequate for Discharge ?  ?

## 2021-09-07 ENCOUNTER — Emergency Department: Payer: Medicare HMO

## 2021-09-07 ENCOUNTER — Other Ambulatory Visit: Payer: Self-pay

## 2021-09-07 ENCOUNTER — Emergency Department
Admission: EM | Admit: 2021-09-07 | Discharge: 2021-09-08 | Disposition: A | Discharge status: Home / Self Care | Payer: Medicare HMO | Attending: Emergency Medicine | Admitting: Emergency Medicine

## 2021-09-07 DIAGNOSIS — Z8669 Personal history of other diseases of the nervous system and sense organs: Secondary | ICD-10-CM

## 2021-09-07 DIAGNOSIS — Y900 Blood alcohol level of less than 20 mg/100 ml: Secondary | ICD-10-CM | POA: Insufficient documentation

## 2021-09-07 DIAGNOSIS — R569 Unspecified convulsions: Secondary | ICD-10-CM | POA: Diagnosis not present

## 2021-09-07 LAB — ETHANOL: Alcohol, Ethyl (B): <10 mg/dL (ref <10)

## 2021-09-07 LAB — CBC WITH DIFFERENTIAL/PLATELET
Abs Immature Granulocytes: 0.17 10*3/uL — ABNORMAL HIGH (ref 0.00–0.07)
Basophils Absolute: 0.1 10*3/uL (ref 0.0–0.1)
Basophils Relative: 0 %
Eosinophils Absolute: 0.2 10*3/uL (ref 0.0–0.5)
Eosinophils Relative: 2 %
HCT: 45.5 % (ref 39.0–52.0)
Hemoglobin: 13.9 g/dL (ref 13.0–17.0)
Immature Granulocytes: 2 %
Lymphocytes Relative: 21 %
Lymphs Abs: 2.4 10*3/uL (ref 0.7–4.0)
MCH: 28 pg (ref 26.0–34.0)
MCHC: 30.5 g/dL (ref 30.0–36.0)
MCV: 91.7 fL (ref 80.0–100.0)
Monocytes Absolute: 1.2 10*3/uL — ABNORMAL HIGH (ref 0.1–1.0)
Monocytes Relative: 10 %
Neutro Abs: 7.7 10*3/uL (ref 1.7–7.7)
Neutrophils Relative %: 65 %
Platelets: 212 10*3/uL (ref 150–400)
RBC: 4.96 MIL/uL (ref 4.22–5.81)
RDW: 17.1 % — ABNORMAL HIGH (ref 11.5–15.5)
WBC: 11.7 10*3/uL — ABNORMAL HIGH (ref 4.0–10.5)
nRBC: 0 % (ref 0.0–0.2)

## 2021-09-07 LAB — SALICYLATE LEVEL: Salicylate Lvl: <7 mg/dL — ABNORMAL LOW (ref 7.0–30.0)

## 2021-09-07 LAB — COMPREHENSIVE METABOLIC PANEL
ALT: 14 U/L (ref 0–44)
AST: 37 U/L (ref 15–41)
Albumin: 4.1 g/dL (ref 3.5–5.0)
Alkaline Phosphatase: 81 U/L (ref 38–126)
Anion gap: 18 — ABNORMAL HIGH (ref 5–15)
BUN: 8 mg/dL (ref 6–20)
CO2: 15 mmol/L — ABNORMAL LOW (ref 22–32)
Calcium: 9.1 mg/dL (ref 8.9–10.3)
Chloride: 104 mmol/L (ref 98–111)
Creatinine, Ser: 1.14 mg/dL (ref 0.61–1.24)
GFR, Estimated: >60 mL/min (ref >60)
Glucose, Bld: 190 mg/dL — ABNORMAL HIGH (ref 70–99)
Potassium: 3.2 mmol/L — ABNORMAL LOW (ref 3.5–5.1)
Sodium: 137 mmol/L (ref 135–145)
Total Bilirubin: 0.6 mg/dL (ref 0.3–1.2)
Total Protein: 7.2 g/dL (ref 6.5–8.1)

## 2021-09-07 LAB — ACETAMINOPHEN LEVEL: Acetaminophen (Tylenol), Serum: <10 ug/mL — ABNORMAL LOW (ref 10–30)

## 2021-09-07 IMAGING — DX DG CHEST 1V PORT
1 series · 1 of 1 positions shown · non-contrast
Comparison: [DATE]

CLINICAL DATA: Tachypnea

EXAM:
PORTABLE CHEST 1 VIEW

[chest ap]
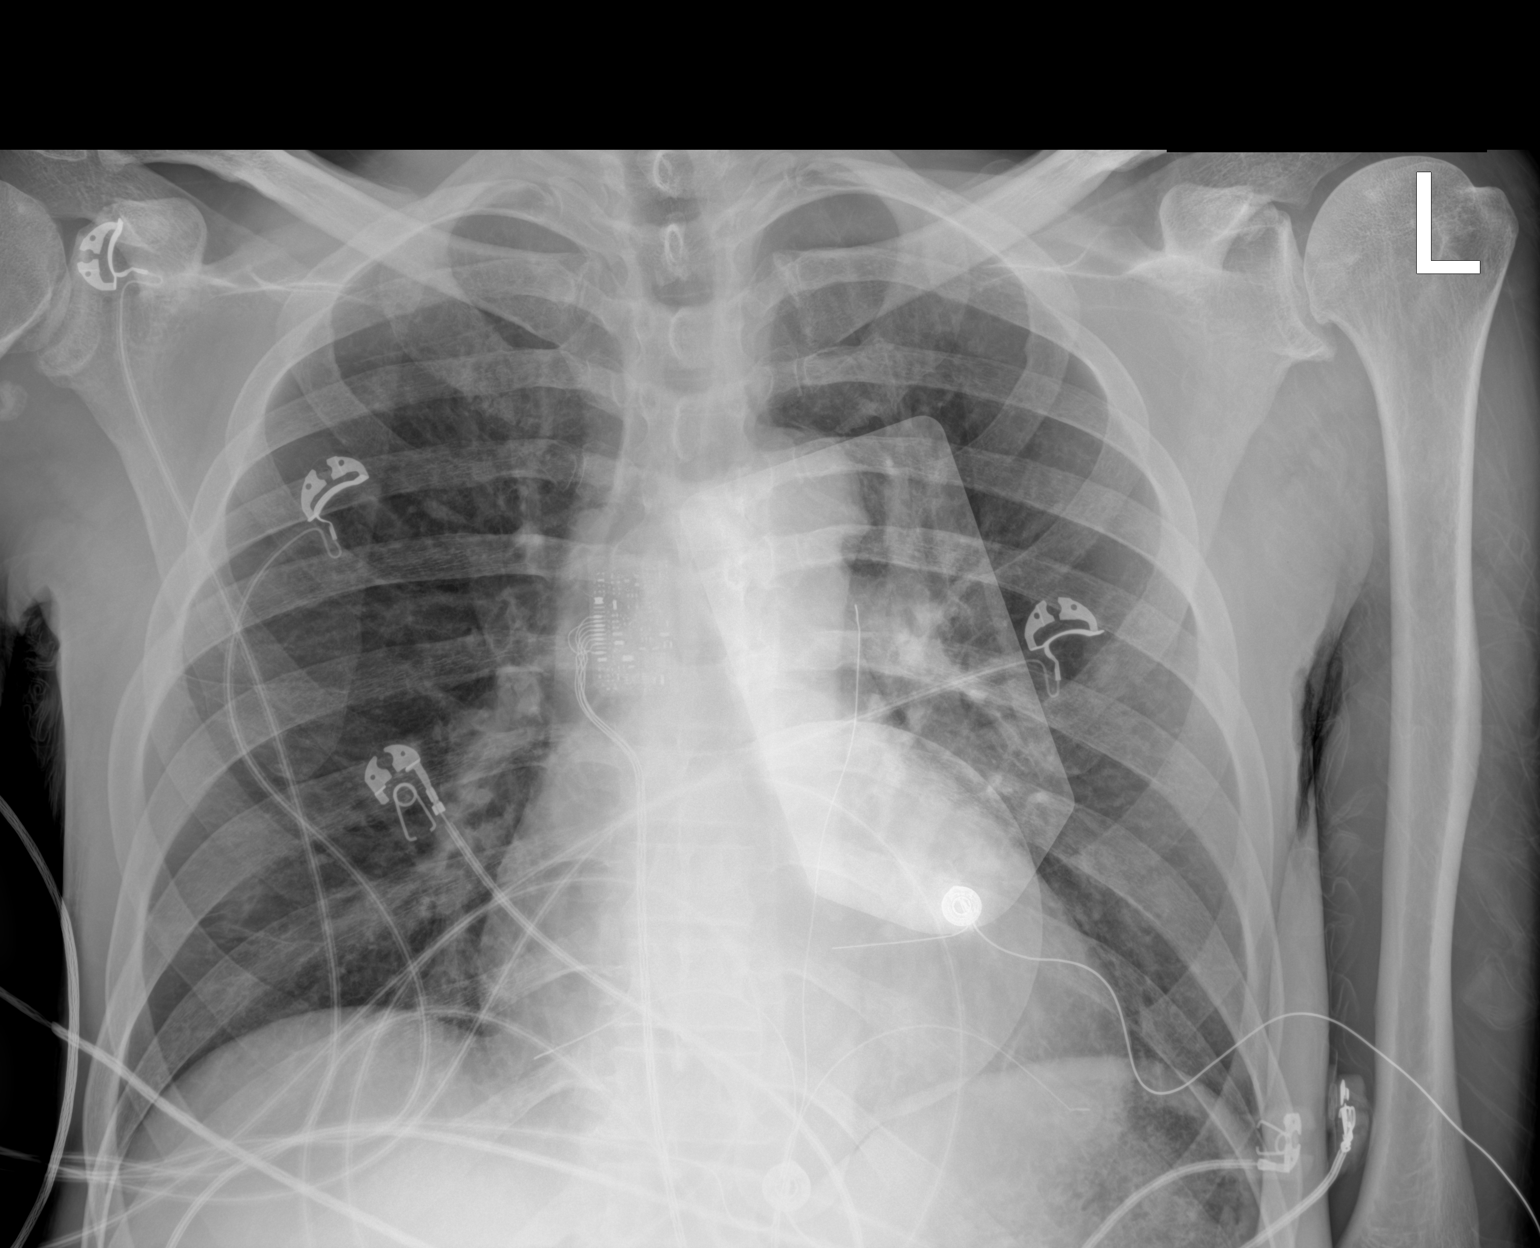

[1 of 1 positions shown; findings below may reference images not displayed]

FINDINGS: The heart size and mediastinal contours are within normal limits.
Both lungs are clear. The visualized skeletal structures are
unremarkable.
IMPRESSION: No active disease.

## 2021-09-07 MED ORDER — FENTANYL 2500MCG IN NS 250ML (10MCG/ML) PREMIX INFUSION
50.0000 ug/h | INTRAVENOUS | Status: DC
  Filled 2021-09-07: qty 250

## 2021-09-07 MED ORDER — PROPOFOL 1000 MG/100ML IV EMUL
0.0000 ug/kg/min | INTRAVENOUS | Status: DC
  Filled 2021-09-07: qty 100

## 2021-09-07 MED ORDER — FENTANYL CITRATE PF 50 MCG/ML IJ SOSY
50.0000 ug | PREFILLED_SYRINGE | Freq: Once | INTRAMUSCULAR | Status: DC
  Filled 2021-09-07: qty 1

## 2021-09-07 MED ORDER — LEVETIRACETAM IN NACL 1000 MG/100ML IV SOLN
INTRAVENOUS | Status: AC
Start: 2021-09-07 — End: 2021-09-07
  Administered 2021-09-07: 1000 mg
  Filled 2021-09-07: qty 200

## 2021-09-07 MED ORDER — FENTANYL BOLUS VIA INFUSION
50.0000 ug | INTRAVENOUS | Status: DC | PRN
  Filled 2021-09-07: qty 100

## 2021-09-07 MED ORDER — ROCURONIUM BROMIDE 50 MG/5ML IV SOLN
75.0000 mg | Freq: Once | INTRAVENOUS | Status: DC
Start: 2021-09-07 — End: 2021-09-08
  Filled 2021-09-07: qty 7.5

## 2021-09-07 MED ORDER — KETAMINE HCL 50 MG/5ML IJ SOSY
75.0000 mg | PREFILLED_SYRINGE | Freq: Once | INTRAMUSCULAR | Status: DC
  Filled 2021-09-07: qty 10

## 2021-09-07 MED ORDER — ROCURONIUM BROMIDE 10 MG/ML (PF) SYRINGE
PREFILLED_SYRINGE | INTRAVENOUS | Status: AC
  Filled 2021-09-07: qty 10

## 2021-09-07 NOTE — ED Notes (Signed)
Pt's girlfriend called for update, advised d/t HIPPA could not give out information, secretary advised to have girlfriend to call an authorize family member (pt's sister) to come to the ER as pt may be d/c home. Girlfriend verbalized understanding.  ?

## 2021-09-07 NOTE — ED Notes (Signed)
Charge RN notified that we are currently trying to get in touch with family member. Pt up for discharge ?

## 2021-09-07 NOTE — ED Triage Notes (Signed)
BIB [facility] from home. Family felt pt was going to have a seizure all day. Pt has seized multiple times fro EMS. EMS gave 10 mg of Versed.  ?98 BGL  ?HR 121 ?112/palp ?99% on NRB. ? ?

## 2021-09-07 NOTE — ED Notes (Signed)
Pt updated on POC, pt verbalized understanding to go home, need family member to come as a safe ride back home, pt verbalized his sister Nahshon Reich. Pt requested additional warm blankets  ?

## 2021-09-07 NOTE — ED Provider Notes (Signed)
? ?[facility] ?Provider Note ? ? Event Date/Time  ? First MD Initiated Contact with Patient 09/07/21 1835   ?  (approximate) ?History  ?Seizures ? ?HPI ?Luis Hunter is a 46 y.o. male with a past medical history of polysubstance abuse, epilepsy, and schizoaffective disorder who presents for seizure-like activity by EMS.  Per EMS, they noticed some seizure-like activity in route and patient was given a total of 10 mg of Versed.  Per family at bedside, patient "looked like he was going to have a seizure all day".  Patient arrives GCS of 9.  Further history and review of systems are unable to be obtained secondary to patient's mental status ?Physical Exam  ?Triage Vital Signs: ?ED Triage Vitals  ?Enc Vitals Group  ?   BP 09/07/21 1830 113/78  ?   Pulse Rate 09/07/21 1830 (!) 117  ?   Resp 09/07/21 1830 (!) 28  ?   Temp 09/07/21 1832 98 ?F (36.7 ?C)  ?   Temp Source 09/07/21 1832 Axillary  ?   SpO2 09/07/21 1830 98 %  ?   Weight 09/07/21 1836 138 lb 14.2 oz (63 kg)  ?   Height 09/07/21 1836 '5\' 10"'$  (1.778 m)  ?   Head Circumference --   ?   Peak Flow --   ?   Pain Score --   ?   Pain Loc --   ?   Pain Edu? --   ?   Excl. in [place]? --   ? ?Most recent vital signs: ?Vitals:  ? 09/07/21 2200 09/07/21 2215  ?BP: (!) 151/107 (!) 136/108  ?Pulse: 93 80  ?Resp: 19 (!) 21  ?Temp:    ?SpO2: 98% 98%  ? ?General: Somnolent ?CV:  Good peripheral perfusion.  ?Resp:  Normal effort.  ?Abd:  No distention.  ?Other:  Middle-aged African-American male laying in bed in no distress.  GCS 10.  Small superficial abrasion to the left cheek.  Bruising to the tip of the tongue ?ED Results / Procedures / Treatments  ?Labs ?(all labs ordered are listed, but only abnormal results are displayed) ?Labs Reviewed  ?COMPREHENSIVE METABOLIC PANEL - Abnormal; Notable for the following components:  ?    Result Value  ? Potassium 3.2 (*)   ? CO2 15 (*)   ? Glucose, Bld 190 (*)   ? Anion gap 18 (*)   ? All other components within normal  limits  ?ACETAMINOPHEN LEVEL - Abnormal; Notable for the following components:  ? Acetaminophen (Tylenol), Serum <10 (*)   ? All other components within normal limits  ?SALICYLATE LEVEL - Abnormal; Notable for the following components:  ? Salicylate Lvl <7.8 (*)   ? All other components within normal limits  ?CBC WITH DIFFERENTIAL/PLATELET - Abnormal; Notable for the following components:  ? WBC 11.7 (*)   ? RDW 17.1 (*)   ? Monocytes Absolute 1.2 (*)   ? Abs Immature Granulocytes 0.17 (*)   ? All other components within normal limits  ?ETHANOL  ?LEVETIRACETAM LEVEL  ?URINALYSIS, ROUTINE W REFLEX MICROSCOPIC  ?TRIGLYCERIDES  ? ?EKG ?ED ECG REPORT ?I, Naaman Plummer, the attending physician, personally viewed and interpreted this ECG. ?Date: 09/07/2021 ?EKG Time: 1830 ?Rate: 116 ?Rhythm: normal sinus rhythm ?QRS Axis: normal ?Intervals: normal ?ST/T Wave abnormalities: normal ?Narrative Interpretation: no evidence of acute ischemia ?PROCEDURES: ?Critical Care performed: Yes, see critical care procedure note(s) ?.1-3 Lead EKG Interpretation ?Performed by: Naaman Plummer, MD ?Authorized by: Cheri Fowler,  Vista Lawman, MD  ? ?  Interpretation: normal   ?  ECG rate:  81 ?  ECG rate assessment: normal   ?  Rhythm: sinus rhythm   ?  Ectopy: none   ?  Conduction: normal   ?CRITICAL CARE ?Performed by: Naaman Plummer ? ?Total critical care time: 49 minutes ? ?Critical care time was exclusive of separately billable procedures and treating other patients. ? ?Critical care was necessary to treat or prevent imminent or life-threatening deterioration. ? ?Critical care was time spent personally by me on the following activities: development of treatment plan with patient and/or surrogate as well as nursing, discussions with consultants, evaluation of patient's response to treatment, examination of patient, obtaining history from patient or surrogate, ordering and performing treatments and interventions, ordering and review of laboratory  studies, ordering and review of radiographic studies, pulse oximetry and re-evaluation of patient's condition. ? ?MEDICATIONS ORDERED IN ED: ?Medications  ?ketamine 50 mg in normal saline 5 mL (10 mg/mL) syringe (75 mg Intravenous Not Given 09/07/21 2219)  ?rocuronium [facility]) injection 75 mg ( Intravenous Not Given 09/07/21 2220)  ?fentaNYL (SUBLIMAZE) injection 50 mcg (50 mcg Intravenous Not Given 09/07/21 2218)  ?fentaNYL 2527mg in NS 2538m(1036mml) infusion-PREMIX (50 mcg/hr Intravenous Not Given 09/07/21 2219)  ?fentaNYL (SUBLIMAZE) bolus via infusion 50-100 mcg (has no administration in time range)  ?propofol (DIPRIVAN) 1000 MG/100ML infusion (0 mcg/kg/min ? 63 kg Intravenous Not Given 09/07/21 2220)  ?levETIRAcetam (KEPPRA) 1000 MG/100ML IVPB (0 mg  Stopped 09/07/21 1847)  ? ?IMPRESSION / MDM / ASSESSMENT AND PLAN / ED COURSE  ?I reviewed the triage vital signs and the nursing notes. ?             ?               ?Patient presents after recent seizure episode. ? ?Patient had slow return to baseline mental and physical function per myself and hospital staff. ?No immunosuppresion hx and had no preceding fever. ?No history of alcohol abuse or suspicion for toxin ingestion. ?Unlikely stroke, syncope. ?Unlikely infectious etiology. ?No preceding trauma. ? ?Workup: EKG, BMP, POCT glucose (pregnancy test if male) and CT Brain. ? ?Field Interventions: ?10 mg Versed ?ED Interventions: ?2 g Keppra IV ?Disposition: Discharge home with primary care follow up in next 24-48 hours. ? ?  ?FINAL CLINICAL IMPRESSION(S) / ED DIAGNOSES  ? ?Final diagnoses:  ?Seizure (HCCPower?History of epilepsy  ? ?Rx / DC Orders  ? ?ED Discharge Orders   ? ? None  ? ?  ? ?Note:  This document was prepared using Dragon voice recognition software and may include unintentional dictation errors. ?  ?BraNaaman PlummerD ?09/07/21 2253 ? ?

## 2021-09-07 NOTE — ED Notes (Signed)
Pt currently on RA, pt pulled out his [place], sats maintained at 99 % on RA at current ?

## 2021-09-07 NOTE — ED Notes (Signed)
Holding off on intubation at this time per MD Bradler, pt is more awake, maintaining  his sats on RA, pt with purposeful movements.  ?

## 2021-09-07 NOTE — ED Notes (Signed)
Pt resting on stretcher, able to respond with touch, pt able to turn self for comfort, VSS at this time, pt remains on RA, safety in place, NAD noted at current, will continue to monitor  ?

## 2021-09-07 NOTE — ED Notes (Signed)
RT Bob at the bedside  ?

## 2021-09-07 NOTE — ED Notes (Signed)
Keppra started

## 2021-09-07 NOTE — ED Notes (Signed)
Attempted to call pt's sister Romone Shaff, unable to make contact, left VM to call back ?

## 2021-09-07 NOTE — ED Notes (Signed)
Pt continues to rest, NAD noted, VSS, pt continues to improve, at current will remain on cardiac devices until a family member to come as a safe ride back home for pt. POC on-going, pt is up for discharge, however pt needs a family member to get him home. Will continue to monitor, safety in place.  ?

## 2021-09-07 NOTE — ED Notes (Signed)
Pt able to speak, stated "I'm cold, put a blanket on me". Pt given two warm blankets for comfort  ?

## 2021-09-07 NOTE — ED Notes (Signed)
MD Bradler decision to prepare to intubate at this time, room set up with proper intubation equipment and RSI, RT Mikki Santee remains at bedside.  ?

## 2021-09-08 DIAGNOSIS — R569 Unspecified convulsions: Secondary | ICD-10-CM | POA: Diagnosis not present

## 2021-09-08 MED ORDER — LEVETIRACETAM 500 MG PO TABS
1500.0000 mg | ORAL_TABLET | Freq: Two times a day (BID) | ORAL | Status: DC
  Administered 2021-09-08: 1500 mg via ORAL
  Filled 2021-09-08: qty 3

## 2021-09-08 NOTE — ED Notes (Signed)
Pt given another urinal as requested. Advised his father has not yet arrived to transport him home, pt verbalized understanding.  ?

## 2021-09-08 NOTE — ED Notes (Signed)
Pt continues to sleep, NAD noted, regular check on pt, remains on monitor, VSS, unlabored RR, skin warm and dry. Safety in place, bed in lowest position, safety pads remain on stretcher. Will continue to monitor, call bell in reach for assistance.  ?

## 2021-09-08 NOTE — ED Notes (Signed)
Attempted to call pt's sister again, still unable to make contact, another message left ?

## 2021-09-08 NOTE — ED Notes (Signed)
Spoke with pt's father again, no one is able to come get the pt until the am, [employer] notified. ? ?Pt currently remains resting, appears asleep, NAD noted, even RR and unlabored. VSS, skin warm and dry. Will continue to monitor, safety in place, cal bell in reach, pt will remain in discharge status.  ?

## 2021-09-08 NOTE — ED Notes (Signed)
Pt asleep. Call bell within reach. Pt lowest position.  ?

## 2021-09-08 NOTE — ED Notes (Signed)
Pt was changed for soiled clothes, pt has BM on self, pt was able to assist with turning and maneuvering. Clean cloth clothes applied to pt.   ?

## 2021-09-08 NOTE — ED Notes (Signed)
Spoke with pt's father, he is ubale to come get pt until the am d/t hot having a car, he will try to get in touch with pt's sister, girlfriend is in a wheelchair per pt's father, therefore she cannot get pt as well. ? ?[employer] aware of pt's status. Pt will remain in treatment room at this time ?

## 2021-09-09 LAB — LEVETIRACETAM LEVEL

## 2021-11-26 ENCOUNTER — Other Ambulatory Visit: Payer: Self-pay

## 2021-11-26 ENCOUNTER — Emergency Department: Payer: Medicare HMO

## 2021-11-26 ENCOUNTER — Emergency Department
Admission: EM | Admit: 2021-11-26 | Discharge: 2021-11-26 | Disposition: A | Discharge status: Home / Self Care | Payer: Medicare HMO | Attending: Emergency Medicine | Admitting: Emergency Medicine

## 2021-11-26 ENCOUNTER — Encounter: Payer: Self-pay | Admitting: Emergency Medicine

## 2021-11-26 DIAGNOSIS — Y9301 Activity, walking, marching and hiking: Secondary | ICD-10-CM | POA: Diagnosis not present

## 2021-11-26 DIAGNOSIS — Z5321 Procedure and treatment not carried out due to patient leaving prior to being seen by health care provider: Secondary | ICD-10-CM | POA: Insufficient documentation

## 2021-11-26 DIAGNOSIS — W1830XA Fall on same level, unspecified, initial encounter: Secondary | ICD-10-CM | POA: Insufficient documentation

## 2021-11-26 DIAGNOSIS — S0181XA Laceration without foreign body of other part of head, initial encounter: Secondary | ICD-10-CM | POA: Insufficient documentation

## 2021-11-26 DIAGNOSIS — S0993XA Unspecified injury of face, initial encounter: Secondary | ICD-10-CM | POA: Diagnosis present

## 2021-11-26 NOTE — ED Triage Notes (Addendum)
First RN Note: Pt to ED via [facility] with c/o fall while walking. Per EMS pt with laceration to R side of his face from nose to R cheek, ice pack applied PTA. Per EMS pt denies LOC, states mechanical fall. Per EMS pt with "small surface lac" from hitting the corner of a nightstand.   CBG 86 HR 70

## 2022-03-09 ENCOUNTER — Emergency Department
Admission: EM | Admit: 2022-03-09 | Discharge: 2022-03-09 | Disposition: A | Discharge status: Home / Self Care | Payer: Medicare HMO | Attending: Emergency Medicine | Admitting: Emergency Medicine

## 2022-03-09 ENCOUNTER — Other Ambulatory Visit: Payer: Self-pay

## 2022-03-09 ENCOUNTER — Emergency Department: Payer: Medicare HMO

## 2022-03-09 DIAGNOSIS — Z1152 Encounter for screening for COVID-19: Secondary | ICD-10-CM | POA: Insufficient documentation

## 2022-03-09 DIAGNOSIS — Z20822 Contact with and (suspected) exposure to covid-19: Secondary | ICD-10-CM | POA: Diagnosis not present

## 2022-03-09 DIAGNOSIS — R112 Nausea with vomiting, unspecified: Secondary | ICD-10-CM

## 2022-03-09 DIAGNOSIS — K2900 Acute gastritis without bleeding: Secondary | ICD-10-CM | POA: Insufficient documentation

## 2022-03-09 DIAGNOSIS — R531 Weakness: Secondary | ICD-10-CM | POA: Diagnosis not present

## 2022-03-09 DIAGNOSIS — R030 Elevated blood-pressure reading, without diagnosis of hypertension: Secondary | ICD-10-CM | POA: Diagnosis not present

## 2022-03-09 LAB — BASIC METABOLIC PANEL
Anion gap: 6 (ref 5–15)
BUN: 13 mg/dL (ref 6–20)
CO2: 22 mmol/L (ref 22–32)
Calcium: 8.4 mg/dL — ABNORMAL LOW (ref 8.9–10.3)
Chloride: 110 mmol/L (ref 98–111)
Creatinine, Ser: 0.77 mg/dL (ref 0.61–1.24)
GFR, Estimated: >60 mL/min (ref >60)
Glucose, Bld: 91 mg/dL (ref 70–99)
Potassium: 4.1 mmol/L (ref 3.5–5.1)
Sodium: 138 mmol/L (ref 135–145)

## 2022-03-09 LAB — URINALYSIS, ROUTINE W REFLEX MICROSCOPIC
Bilirubin Urine: NEGATIVE
Glucose, UA: NEGATIVE mg/dL
Hgb urine dipstick: NEGATIVE
Ketones, ur: 5 mg/dL — AB
Leukocytes,Ua: NEGATIVE
Nitrite: NEGATIVE
Protein, ur: NEGATIVE mg/dL
Specific Gravity, Urine: >1.046 — ABNORMAL HIGH (ref 1.005–1.030)
pH: 7 (ref 5.0–8.0)

## 2022-03-09 LAB — CBC
HCT: 43.2 % (ref 39.0–52.0)
Hemoglobin: 14.2 g/dL (ref 13.0–17.0)
MCH: 28.9 pg (ref 26.0–34.0)
MCHC: 32.9 g/dL (ref 30.0–36.0)
MCV: 88 fL (ref 80.0–100.0)
Platelets: 145 10*3/uL — ABNORMAL LOW (ref 150–400)
RBC: 4.91 MIL/uL (ref 4.22–5.81)
RDW: 15.4 % (ref 11.5–15.5)
WBC: 10.6 10*3/uL — ABNORMAL HIGH (ref 4.0–10.5)
nRBC: 0 % (ref 0.0–0.2)

## 2022-03-09 LAB — HEPATIC FUNCTION PANEL
ALT: 30 U/L (ref 0–44)
AST: 30 U/L (ref 15–41)
Albumin: 3.5 g/dL (ref 3.5–5.0)
Alkaline Phosphatase: 45 U/L (ref 38–126)
Bilirubin, Direct: 0.1 mg/dL (ref 0.0–0.2)
Indirect Bilirubin: 0.3 mg/dL (ref 0.3–0.9)
Total Bilirubin: 0.4 mg/dL (ref 0.3–1.2)
Total Protein: 6.7 g/dL (ref 6.5–8.1)

## 2022-03-09 LAB — RESP PANEL BY RT-PCR (FLU A&B, COVID) ARPGX2
Influenza A by PCR: NEGATIVE
Influenza B by PCR: NEGATIVE
SARS Coronavirus 2 by RT PCR: NEGATIVE

## 2022-03-09 LAB — VALPROIC ACID LEVEL: Valproic Acid Lvl: 44 ug/mL — ABNORMAL LOW (ref 50.0–100.0)

## 2022-03-09 LAB — LIPASE, BLOOD: Lipase: 24 U/L (ref 11–51)

## 2022-03-09 LAB — TROPONIN I (HIGH SENSITIVITY)
Troponin I (High Sensitivity): 4 ng/L (ref <18)
Troponin I (High Sensitivity): 4 ng/L (ref <18)

## 2022-03-09 MED ORDER — FAMOTIDINE IN NACL 20-0.9 MG/50ML-% IV SOLN
20.0000 mg | Freq: Once | INTRAVENOUS | Status: AC
Start: 2022-03-09 — End: 2022-03-09
  Administered 2022-03-09: 20 mg via INTRAVENOUS
  Filled 2022-03-09: qty 50

## 2022-03-09 MED ORDER — ONDANSETRON HCL 4 MG/2ML IJ SOLN
4.0000 mg | Freq: Once | INTRAMUSCULAR | Status: AC
  Administered 2022-03-09: 4 mg via INTRAVENOUS
  Filled 2022-03-09: qty 2

## 2022-03-09 MED ORDER — FAMOTIDINE 20 MG PO TABS
20.0000 mg | ORAL_TABLET | Freq: Once | ORAL | Status: DC
  Filled 2022-03-09: qty 1

## 2022-03-09 MED ORDER — IOHEXOL 300 MG/ML  SOLN
100.0000 mL | Freq: Once | INTRAMUSCULAR | Status: AC | PRN
  Administered 2022-03-09: 100 mL via INTRAVENOUS

## 2022-03-09 MED ORDER — SODIUM CHLORIDE 0.9 % IV BOLUS
1000.0000 mL | Freq: Once | INTRAVENOUS | Status: AC
  Administered 2022-03-09: 1000 mL via INTRAVENOUS

## 2022-03-09 MED ORDER — LEVETIRACETAM 500 MG PO TABS
1500.0000 mg | ORAL_TABLET | Freq: Once | ORAL | Status: AC
  Administered 2022-03-09: 1500 mg via ORAL
  Filled 2022-03-09: qty 3

## 2022-03-09 MED ORDER — ONDANSETRON 4 MG PO TBDP: 4.0000 mg | ORAL_TABLET | Freq: Four times a day (QID) | ORAL | 0 refills | Status: DC | PRN

## 2022-03-09 MED ORDER — ONDANSETRON 4 MG PO TBDP
4.0000 mg | ORAL_TABLET | Freq: Once | ORAL | Status: DC
  Filled 2022-03-09: qty 1

## 2022-03-09 NOTE — ED Notes (Signed)
Blue and red tubes sent to lab with other tubes.

## 2022-03-09 NOTE — ED Notes (Signed)
EDP at bedside  

## 2022-03-09 NOTE — ED Provider Notes (Signed)
[facility] Provider Note    Event Date/Time   First MD Initiated Contact with Patient 03/09/22 1728     (approximate)   History   Weakness and Emesis   HPI  Luis Hunter is a 46 y.o. male history of according to previous notes of polysubstance abuse epilepsy schizoaffective disorder and seizure-like activity.   Patient reports this morning he woke up and after trying to eat he felt nauseated and threw up about 3 or 4 times the food and water he tried to drink.  He felt a little fatigued throughout the day.  Now though he reports the nausea is gone and he feels a lot better.  No fevers.  Did not have any stomach pain except when he was nauseated earlier but no longer now.  Normal bowel movements.  No chest pain.  No trouble breathing.  Denies cough.  He takes his medications, reports he took [place] as well as his Depakote today.  No pain or burning with urination.  Right now just reports he just does not quite feel himself, but otherwise no pain no nausea.  No headaches.  Did not have any seizures, but he felt earlier when he was nauseated or though he is going to vomit as though he might have 1 but did not.  Physical Exam   Triage Vital Signs: ED Triage Vitals  Enc Vitals Group     BP 03/09/22 1711 (!) 157/125     Pulse Rate 03/09/22 1711 91     Resp 03/09/22 1711 15     Temp 03/09/22 1711 98.9 F (37.2 C)     Temp Source 03/09/22 1711 Oral     SpO2 03/09/22 1711 96 %     Weight 03/09/22 1712 180 lb (81.6 kg)     Height 03/09/22 1712 '5\' 11"'$  (1.803 m)     Head Circumference --      Peak Flow --      Pain Score 03/09/22 1711 0     Pain Loc --      Pain Edu? --      Excl. in [place]? --     Most recent vital signs: Vitals:   03/09/22 2045 03/09/22 2124  BP:    Pulse: 92   Resp:    Temp:  98.7 F (37.1 C)  SpO2: 99%      General: Awake, no distress.  Very pleasant.  Well oriented.  Sits up follows commands without difficulty. CV:  Good  peripheral perfusion.  Normal heart tones and rate. Resp:  Normal effort.  Lungs are clear bilaterally. Abd:  No distention.  Reports no pain to palpation in any quadrant.  Abdomen soft nontender nondistended in all quadrants.  Negative Murphy.  No beta McBurney's point. Other:  Warm well-perfused extremities.  No tremulousness.  No seizure-like activity.  He is alert well oriented   ED Results / Procedures / Treatments   Labs (all labs ordered are listed, but only abnormal results are displayed) Labs Reviewed  CBC - Abnormal; Notable for the following components:      Result Value   WBC 10.6 (*)    Platelets 145 (*)    All other components within normal limits  VALPROIC ACID LEVEL - Abnormal; Notable for the following components:   Valproic Acid Lvl 44 (*)    All other components within normal limits  BASIC METABOLIC PANEL - Abnormal; Notable for the following components:   Calcium 8.4 (*)  All other components within normal limits  URINALYSIS, ROUTINE W REFLEX MICROSCOPIC - Abnormal; Notable for the following components:   Color, Urine YELLOW (*)    APPearance CLEAR (*)    Specific Gravity, Urine >1.046 (*)    Ketones, ur 5 (*)    All other components within normal limits  RESP PANEL BY RT-PCR (FLU A&B, COVID) [id number]  HEPATIC FUNCTION PANEL  LIPASE, BLOOD  TROPONIN I (HIGH SENSITIVITY)  TROPONIN I (HIGH SENSITIVITY)     EKG  And interpreted by me at 1710 heart rate 80 QRS 90 QTc 410 Normal sinus rhythm, left ventricular hypertrophy with repolarization abnormality Compared with previous EKGs including April of this year no significant changes noted.  Repolarization abnormality seems to account for ST abnormality  RADIOLOGY  Chest x-ray interpreted by me as negative for acute finding   CT: IMPRESSION: 1. Mild distal circumferential esophageal thickening may be due to ongoing nausea vomiting. 2. Study otherwise negative for acute findings in the abdomen or in the  pelvis.   PROCEDURES:  Critical Care performed: No  Procedures   MEDICATIONS ORDERED IN ED: Medications  levETIRAcetam (KEPPRA) tablet 1,500 mg (has no administration in time range)  sodium chloride 0.9 % bolus 1,000 mL (0 mLs Intravenous Stopped 03/09/22 1958)  ondansetron (ZOFRAN) injection 4 mg (4 mg Intravenous Given 03/09/22 1829)  famotidine (PEPCID) IVPB 20 mg premix (0 mg Intravenous Stopped 03/09/22 1958)  iohexol (OMNIPAQUE) 300 MG/ML solution 100 mL (100 mLs Intravenous Contrast Given 03/09/22 1906)     IMPRESSION / MDM / ASSESSMENT AND PLAN / ED COURSE  I reviewed the triage vital signs and the nursing notes.                              Differential diagnosis includes, but is not limited to, possible dehydration, possible episode of gastritis, viral syndrome, or abdominal abnormality, ileus, obstruction, etc. medication effect, etc. are all considered.  No acute cardiopulmonary or respiratory symptoms.  He does report that he was nauseated and vomited earlier today but all symptoms have abated.  Is extremely reassuring abdominal exam with no tenderness or pain.  He is afebrile.  He has a very minimal leukocytosis and we will work this up with further testing including hepatic function, urinalysis, check viral flu and COVID studies etc.  Will hydrate and plan to reassess.  As he is currently asymptomatic and finds to be quite reassuring.    Patient's presentation is most consistent with acute complicated illness / injury requiring diagnostic workup.  The patient is on the cardiac monitor to evaluate for evidence of arrhythmia and/or significant heart rate changes.  Labs reviewed notable for normal comprehensive metabolic panel.  Normal troponin.   ----------------------------------------- 6:52 PM on 03/09/2022 ----------------------------------------- Patient once again vomiting.  He has vomited a couple more times.  At this juncture, we will proceed with CT imaging to  further evaluate for acute intra-abdominal process given his ongoing vomiting.  In addition given his history of epilepsy seizure disorder and presentation with elevated blood pressure I will obtain a CT of the head also to exclude acute intracranial process such as intracranial hemorrhage though my clinical gestalt at this point is suggestive of likely upper abdominal/gastritis like or other intra-abdominal etiology.  ----------------------------------------- 9:07 PM on 03/09/2022 ----------------------------------------- After receiving fluids and antiemetic patient reports he feels perfectly well.  He is ready to try eating something.  He is awake alert and  oriented.  Awaiting urinalysis, will give p.o. challenge with sandwich tray provided to patient at this time.  Thus far work-up quite reassuring.  Await urinalysis, and if patient does well with his trial of eating then I anticipate we may be able to send him home  ----------------------------------------- 10:18 PM on 03/09/2022 ----------------------------------------- Patient able to tolerate eating meal tray.  Resting comfortably asymptomatic at this time.  Suspect some sort of self-limited upper abdominal etiology for nausea and vomiting possibly gastritis or food poisoning etc.  Nonetheless reassuring work-up, fully alert oriented able to eat and drink now.  Discussed careful return precautions and follow-up recommendations.  Patient in agreement.  We will also provide him his home dose of Keppra this evening  Return precautions and treatment recommendations and follow-up discussed with the patient who is agreeable with the plan.   FINAL CLINICAL IMPRESSION(S) / ED DIAGNOSES   Final diagnoses:  Acute gastritis without hemorrhage, unspecified gastritis type  Nausea and vomiting, unspecified vomiting type     Rx / DC Orders   ED Discharge Orders          Ordered    ondansetron (ZOFRAN-ODT) 4 MG disintegrating tablet  Every 6  hours PRN        03/09/22 2217             Note:  This document was prepared using Dragon voice recognition software and may include unintentional dictation errors.   Delman Kitten, MD 03/09/22 2218

## 2022-03-09 NOTE — Discharge Instructions (Addendum)

## 2022-03-09 NOTE — ED Triage Notes (Signed)
Pt to ED from home, [facility] for weakness and vomiting (4 times today). States cannot eat or drink because of nausea  Hx seizures, takes depakote and keppra, is taking meds as prescribed. Last seizure 5/23  148/104 CBG 119 HR 86 98% on RA 98.6 temp  Pt has slightly slurred speech which is his baseline Denies CP, SOB, HA, blurred vision

## 2022-03-09 NOTE — ED Notes (Signed)
Attempted to call all of patients contact numbers for ride home. All stated they could not come to pick him up. Patient in the lobby awaiting ride. He is stable and vitally appropriate at baseline

## 2022-11-15 ENCOUNTER — Inpatient Hospital Stay (HOSPITAL_COMMUNITY): Payer: Medicare HMO

## 2022-11-15 ENCOUNTER — Emergency Department: Payer: Medicare HMO

## 2022-11-15 ENCOUNTER — Emergency Department
Admission: EM | Admit: 2022-11-15 | Discharge: 2022-11-15 | Disposition: A | Discharge status: Other Acute Inpatient Hospital | Payer: Medicare HMO | Source: Home / Self Care | Attending: Emergency Medicine | Admitting: Emergency Medicine

## 2022-11-15 ENCOUNTER — Encounter (HOSPITAL_COMMUNITY): Payer: Self-pay

## 2022-11-15 ENCOUNTER — Inpatient Hospital Stay (HOSPITAL_COMMUNITY)
Admission: RE | Admit: 2022-11-15 | Discharge: 2022-11-19 | DRG: 101 | Disposition: A | Discharge status: Home / Self Care | Payer: Medicare HMO | Source: Other Acute Inpatient Hospital | Attending: Family Medicine | Admitting: Family Medicine

## 2022-11-15 ENCOUNTER — Inpatient Hospital Stay (HOSPITAL_COMMUNITY)
Admit: 2022-11-15 | Discharge: 2022-11-15 | Disposition: A | Discharge status: Home / Self Care | Payer: Medicare HMO | Attending: Neurology | Admitting: Neurology

## 2022-11-15 ENCOUNTER — Other Ambulatory Visit: Payer: Self-pay

## 2022-11-15 DIAGNOSIS — I9589 Other hypotension: Secondary | ICD-10-CM | POA: Diagnosis not present

## 2022-11-15 DIAGNOSIS — F259 Schizoaffective disorder, unspecified: Secondary | ICD-10-CM | POA: Diagnosis present

## 2022-11-15 DIAGNOSIS — R000 Tachycardia, unspecified: Secondary | ICD-10-CM | POA: Diagnosis present

## 2022-11-15 DIAGNOSIS — G40901 Epilepsy, unspecified, not intractable, with status epilepticus: Secondary | ICD-10-CM

## 2022-11-15 DIAGNOSIS — Z79899 Other long term (current) drug therapy: Secondary | ICD-10-CM

## 2022-11-15 DIAGNOSIS — R0902 Hypoxemia: Secondary | ICD-10-CM | POA: Insufficient documentation

## 2022-11-15 DIAGNOSIS — R569 Unspecified convulsions: Secondary | ICD-10-CM | POA: Diagnosis present

## 2022-11-15 DIAGNOSIS — I428 Other cardiomyopathies: Secondary | ICD-10-CM | POA: Diagnosis present

## 2022-11-15 DIAGNOSIS — I952 Hypotension due to drugs: Secondary | ICD-10-CM | POA: Diagnosis present

## 2022-11-15 DIAGNOSIS — T4275XA Adverse effect of unspecified antiepileptic and sedative-hypnotic drugs, initial encounter: Secondary | ICD-10-CM | POA: Diagnosis present

## 2022-11-15 DIAGNOSIS — F17200 Nicotine dependence, unspecified, uncomplicated: Secondary | ICD-10-CM | POA: Diagnosis not present

## 2022-11-15 DIAGNOSIS — I5022 Chronic systolic (congestive) heart failure: Secondary | ICD-10-CM | POA: Diagnosis present

## 2022-11-15 DIAGNOSIS — I2489 Other forms of acute ischemic heart disease: Secondary | ICD-10-CM | POA: Diagnosis present

## 2022-11-15 DIAGNOSIS — F319 Bipolar disorder, unspecified: Secondary | ICD-10-CM | POA: Diagnosis present

## 2022-11-15 DIAGNOSIS — F209 Schizophrenia, unspecified: Secondary | ICD-10-CM | POA: Diagnosis present

## 2022-11-15 DIAGNOSIS — I5020 Unspecified systolic (congestive) heart failure: Secondary | ICD-10-CM | POA: Diagnosis not present

## 2022-11-15 DIAGNOSIS — F1910 Other psychoactive substance abuse, uncomplicated: Secondary | ICD-10-CM | POA: Diagnosis present

## 2022-11-15 DIAGNOSIS — R292 Abnormal reflex: Secondary | ICD-10-CM | POA: Insufficient documentation

## 2022-11-15 DIAGNOSIS — R7989 Other specified abnormal findings of blood chemistry: Secondary | ICD-10-CM | POA: Insufficient documentation

## 2022-11-15 DIAGNOSIS — Y92239 Unspecified place in hospital as the place of occurrence of the external cause: Secondary | ICD-10-CM | POA: Diagnosis present

## 2022-11-15 DIAGNOSIS — F1290 Cannabis use, unspecified, uncomplicated: Secondary | ICD-10-CM | POA: Diagnosis present

## 2022-11-15 DIAGNOSIS — J988 Other specified respiratory disorders: Secondary | ICD-10-CM | POA: Diagnosis not present

## 2022-11-15 DIAGNOSIS — G9341 Metabolic encephalopathy: Secondary | ICD-10-CM | POA: Diagnosis present

## 2022-11-15 DIAGNOSIS — E876 Hypokalemia: Secondary | ICD-10-CM | POA: Diagnosis present

## 2022-11-15 DIAGNOSIS — R0689 Other abnormalities of breathing: Secondary | ICD-10-CM | POA: Diagnosis present

## 2022-11-15 DIAGNOSIS — I10 Essential (primary) hypertension: Secondary | ICD-10-CM | POA: Insufficient documentation

## 2022-11-15 DIAGNOSIS — I110 Hypertensive heart disease with heart failure: Secondary | ICD-10-CM | POA: Diagnosis present

## 2022-11-15 DIAGNOSIS — Z888 Allergy status to other drugs, medicaments and biological substances status: Secondary | ICD-10-CM | POA: Diagnosis not present

## 2022-11-15 LAB — COMPREHENSIVE METABOLIC PANEL
ALT: 21 U/L (ref 0–44)
AST: 41 U/L (ref 15–41)
Albumin: 3.9 g/dL (ref 3.5–5.0)
Alkaline Phosphatase: 51 U/L (ref 38–126)
Anion gap: 17 — ABNORMAL HIGH (ref 5–15)
BUN: 13 mg/dL (ref 6–20)
CO2: 15 mmol/L — ABNORMAL LOW (ref 22–32)
Calcium: 9 mg/dL (ref 8.9–10.3)
Chloride: 106 mmol/L (ref 98–111)
Creatinine, Ser: 1.15 mg/dL (ref 0.61–1.24)
GFR, Estimated: >60 mL/min (ref >60)
Glucose, Bld: 124 mg/dL — ABNORMAL HIGH (ref 70–99)
Potassium: 3.8 mmol/L (ref 3.5–5.1)
Sodium: 138 mmol/L (ref 135–145)
Total Bilirubin: 0.4 mg/dL (ref 0.3–1.2)
Total Protein: 7.2 g/dL (ref 6.5–8.1)

## 2022-11-15 LAB — ETHANOL: Alcohol, Ethyl (B): <10 mg/dL (ref <10)

## 2022-11-15 LAB — CBC WITH DIFFERENTIAL/PLATELET
Abs Immature Granulocytes: 0.14 10*3/uL — ABNORMAL HIGH (ref 0.00–0.07)
Basophils Absolute: 0 10*3/uL (ref 0.0–0.1)
Basophils Relative: 0 %
Eosinophils Absolute: 0.3 10*3/uL (ref 0.0–0.5)
Eosinophils Relative: 3 %
HCT: 45.4 % (ref 39.0–52.0)
Hemoglobin: 14.7 g/dL (ref 13.0–17.0)
Immature Granulocytes: 1 %
Lymphocytes Relative: 23 %
Lymphs Abs: 2.5 10*3/uL (ref 0.7–4.0)
MCH: 29.1 pg (ref 26.0–34.0)
MCHC: 32.4 g/dL (ref 30.0–36.0)
MCV: 89.7 fL (ref 80.0–100.0)
Monocytes Absolute: 0.8 10*3/uL (ref 0.1–1.0)
Monocytes Relative: 8 %
Neutro Abs: 6.8 10*3/uL (ref 1.7–7.7)
Neutrophils Relative %: 65 %
Platelets: 156 10*3/uL (ref 150–400)
RBC: 5.06 MIL/uL (ref 4.22–5.81)
RDW: 16.1 % — ABNORMAL HIGH (ref 11.5–15.5)
WBC: 10.6 10*3/uL — ABNORMAL HIGH (ref 4.0–10.5)
nRBC: 0 % (ref 0.0–0.2)

## 2022-11-15 LAB — BLOOD GAS, ARTERIAL
Acid-Base Excess: 1.2 mmol/L (ref 0.0–2.0)
Bicarbonate: 26 mmol/L (ref 20.0–28.0)
Drawn by: 164
O2 Saturation: 99.7 %
Patient temperature: 37
pCO2 arterial: 41 mmHg (ref 32–48)
pH, Arterial: 7.41 (ref 7.35–7.45)
pO2, Arterial: 161 mmHg — ABNORMAL HIGH (ref 83–108)

## 2022-11-15 LAB — RAPID URINE DRUG SCREEN, HOSP PERFORMED
Amphetamines: NOT DETECTED
Barbiturates: NOT DETECTED
Benzodiazepines: POSITIVE — AB
Cocaine: NOT DETECTED
Opiates: NOT DETECTED
Tetrahydrocannabinol: POSITIVE — AB

## 2022-11-15 LAB — LACTIC ACID, PLASMA
Lactic Acid, Venous: 2.3 mmol/L (ref 0.5–1.9)
Lactic Acid, Venous: 1.1 mmol/L (ref 0.5–1.9)

## 2022-11-15 LAB — URINALYSIS, ROUTINE W REFLEX MICROSCOPIC
Bilirubin Urine: NEGATIVE
Glucose, UA: NEGATIVE mg/dL
Hgb urine dipstick: NEGATIVE
Ketones, ur: NEGATIVE mg/dL
Leukocytes,Ua: NEGATIVE
Nitrite: NEGATIVE
Protein, ur: NEGATIVE mg/dL
Specific Gravity, Urine: 1.005 (ref 1.005–1.030)
pH: 5 (ref 5.0–8.0)

## 2022-11-15 LAB — GLUCOSE, CAPILLARY
Glucose-Capillary: 96 mg/dL (ref 70–99)
Glucose-Capillary: 98 mg/dL (ref 70–99)
Glucose-Capillary: 99 mg/dL (ref 70–99)

## 2022-11-15 LAB — TROPONIN I (HIGH SENSITIVITY)
Troponin I (High Sensitivity): 283 ng/L (ref <18)
Troponin I (High Sensitivity): 212 ng/L (ref <18)

## 2022-11-15 LAB — SEDIMENTATION RATE: Sed Rate: 3 mm/hr (ref 0–16)

## 2022-11-15 LAB — PROCALCITONIN: Procalcitonin: <0.1 ng/mL

## 2022-11-15 LAB — MRSA NEXT GEN BY PCR, NASAL: MRSA by PCR Next Gen: NOT DETECTED

## 2022-11-15 LAB — VALPROIC ACID LEVEL: Valproic Acid Lvl: 50 ug/mL (ref 50.0–100.0)

## 2022-11-15 LAB — CK: Total CK: 344 U/L (ref 49–397)

## 2022-11-15 LAB — MAGNESIUM: Magnesium: 1.9 mg/dL (ref 1.7–2.4)

## 2022-11-15 LAB — HIV ANTIBODY (ROUTINE TESTING W REFLEX): HIV Screen 4th Generation wRfx: NONREACTIVE

## 2022-11-15 MED ORDER — FENTANYL BOLUS VIA INFUSION: 50.0000 ug | INTRAVENOUS | Status: DC | PRN

## 2022-11-15 MED ORDER — FAMOTIDINE 20 MG PO TABS
20.0000 mg | ORAL_TABLET | Freq: Two times a day (BID) | ORAL | Status: DC
  Administered 2022-11-15 – 2022-11-18 (×4): 20 mg
  Filled 2022-11-15 (×4): qty 1

## 2022-11-15 MED ORDER — SODIUM CHLORIDE 0.9 % IV SOLN
200.0000 mg | Freq: Once | INTRAVENOUS | Status: AC
  Administered 2022-11-15: 200 mg via INTRAVENOUS
  Filled 2022-11-15: qty 20

## 2022-11-15 MED ORDER — LEVETIRACETAM IN NACL 1500 MG/100ML IV SOLN
1500.0000 mg | Freq: Once | INTRAVENOUS | Status: DC
  Filled 2022-11-15: qty 100

## 2022-11-15 MED ORDER — ETOMIDATE 2 MG/ML IV SOLN
INTRAVENOUS | Status: AC
  Administered 2022-11-15: 30 mg
  Filled 2022-11-15: qty 20

## 2022-11-15 MED ORDER — ORAL CARE MOUTH RINSE
15.0000 mL | OROMUCOSAL | Status: DC | PRN
  Administered 2022-11-18: 15 mL via OROMUCOSAL

## 2022-11-15 MED ORDER — LACTATED RINGERS IV BOLUS
1000.0000 mL | Freq: Once | INTRAVENOUS | Status: AC
  Administered 2022-11-15: 1000 mL via INTRAVENOUS

## 2022-11-15 MED ORDER — LEVETIRACETAM IN NACL 1000 MG/100ML IV SOLN
INTRAVENOUS | Status: AC
  Administered 2022-11-15: 1000 mg via INTRAVENOUS
  Filled 2022-11-15: qty 100

## 2022-11-15 MED ORDER — POLYETHYLENE GLYCOL 3350 17 G PO PACK: 17.0000 g | PACK | Freq: Every day | ORAL | Status: DC | PRN

## 2022-11-15 MED ORDER — NOREPINEPHRINE 4 MG/250ML-% IV SOLN: 2.0000 ug/min | INTRAVENOUS | Status: DC

## 2022-11-15 MED ORDER — NOREPINEPHRINE 4 MG/250ML-% IV SOLN
INTRAVENOUS | Status: AC
  Filled 2022-11-15: qty 250

## 2022-11-15 MED ORDER — MIDAZOLAM BOLUS VIA INFUSION: 0.0000 mg | INTRAVENOUS | Status: DC | PRN

## 2022-11-15 MED ORDER — PROPOFOL 1000 MG/100ML IV EMUL
0.0000 ug/kg/min | INTRAVENOUS | Status: DC
  Administered 2022-11-15 (×2): 40 ug/kg/min via INTRAVENOUS
  Administered 2022-11-15: 50 ug/kg/min via INTRAVENOUS
  Administered 2022-11-16: 40 ug/kg/min via INTRAVENOUS
  Administered 2022-11-16: 30 ug/kg/min via INTRAVENOUS
  Filled 2022-11-15 (×4): qty 100

## 2022-11-15 MED ORDER — SODIUM CHLORIDE 0.9 % IV SOLN
250.0000 mL | INTRAVENOUS | Status: DC
  Administered 2022-11-15 – 2022-11-18 (×3): 250 mL via INTRAVENOUS

## 2022-11-15 MED ORDER — FENTANYL CITRATE PF 50 MCG/ML IJ SOSY
50.0000 ug | PREFILLED_SYRINGE | Freq: Once | INTRAMUSCULAR | Status: AC
  Administered 2022-11-15: 50 ug via INTRAVENOUS

## 2022-11-15 MED ORDER — LEVETIRACETAM IN NACL 1500 MG/100ML IV SOLN
1500.0000 mg | Freq: Once | INTRAVENOUS | Status: AC
  Administered 2022-11-15: 1500 mg via INTRAVENOUS
  Filled 2022-11-15: qty 100

## 2022-11-15 MED ORDER — LEVETIRACETAM IN NACL 1000 MG/100ML IV SOLN
1000.0000 mg | Freq: Once | INTRAVENOUS | Status: AC
  Administered 2022-11-15: 1000 mg via INTRAVENOUS

## 2022-11-15 MED ORDER — MIDAZOLAM HCL 2 MG/2ML IJ SOLN
INTRAMUSCULAR | Status: AC
  Filled 2022-11-15: qty 2

## 2022-11-15 MED ORDER — LORAZEPAM 2 MG/ML IJ SOLN: 2.0000 mg | Freq: Once | INTRAMUSCULAR | Status: AC

## 2022-11-15 MED ORDER — ALBUTEROL SULFATE (2.5 MG/3ML) 0.083% IN NEBU: 2.5000 mg | INHALATION_SOLUTION | RESPIRATORY_TRACT | Status: DC | PRN

## 2022-11-15 MED ORDER — LEVETIRACETAM IN NACL 1000 MG/100ML IV SOLN: 1000.0000 mg | Freq: Once | INTRAVENOUS | Status: DC

## 2022-11-15 MED ORDER — MIDAZOLAM-SODIUM CHLORIDE 100-0.9 MG/100ML-% IV SOLN
0.0000 mg/h | INTRAVENOUS | Status: DC
  Administered 2022-11-15: 2 mg/h via INTRAVENOUS
  Filled 2022-11-15: qty 100

## 2022-11-15 MED ORDER — HEPARIN SODIUM (PORCINE) 5000 UNIT/ML IJ SOLN
5000.0000 [IU] | Freq: Three times a day (TID) | INTRAMUSCULAR | Status: DC
  Administered 2022-11-15 – 2022-11-19 (×12): 5000 [IU] via SUBCUTANEOUS
  Filled 2022-11-15 (×12): qty 1

## 2022-11-15 MED ORDER — SODIUM CHLORIDE 0.9 % IV SOLN: 250.0000 mL | INTRAVENOUS | Status: DC

## 2022-11-15 MED ORDER — DOCUSATE SODIUM 50 MG/5ML PO LIQD
100.0000 mg | Freq: Two times a day (BID) | ORAL | Status: DC
  Administered 2022-11-15 – 2022-11-16 (×3): 100 mg
  Filled 2022-11-15 (×3): qty 10

## 2022-11-15 MED ORDER — LEVETIRACETAM IN NACL 1000 MG/100ML IV SOLN: 1000.0000 mg | Freq: Once | INTRAVENOUS | Status: AC

## 2022-11-15 MED ORDER — SODIUM CHLORIDE 0.9 % IV SOLN
4500.0000 mg | Freq: Once | INTRAVENOUS | Status: DC
Start: 2022-11-15 — End: 2022-11-15

## 2022-11-15 MED ORDER — ORAL CARE MOUTH RINSE
15.0000 mL | OROMUCOSAL | Status: DC
  Administered 2022-11-15 – 2022-11-17 (×19): 15 mL via OROMUCOSAL

## 2022-11-15 MED ORDER — LEVETIRACETAM IN NACL 1000 MG/100ML IV SOLN
INTRAVENOUS | Status: AC
  Filled 2022-11-15: qty 100

## 2022-11-15 MED ORDER — FOLIC ACID 5 MG/ML IJ SOLN
1.0000 mg | Freq: Every day | INTRAMUSCULAR | Status: DC
  Administered 2022-11-15 – 2022-11-16 (×2): 1 mg via INTRAVENOUS
  Filled 2022-11-15 (×3): qty 0.2

## 2022-11-15 MED ORDER — POLYETHYLENE GLYCOL 3350 17 G PO PACK
17.0000 g | PACK | Freq: Every day | ORAL | Status: DC
  Administered 2022-11-16: 17 g
  Filled 2022-11-15: qty 1

## 2022-11-15 MED ORDER — SUCCINYLCHOLINE CHLORIDE 200 MG/10ML IV SOSY
PREFILLED_SYRINGE | INTRAVENOUS | Status: AC
  Administered 2022-11-15: 200 mg
  Filled 2022-11-15: qty 20

## 2022-11-15 MED ORDER — THIAMINE HCL 100 MG/ML IJ SOLN
100.0000 mg | Freq: Every day | INTRAMUSCULAR | Status: DC
  Administered 2022-11-15 – 2022-11-16 (×2): 100 mg via INTRAVENOUS
  Filled 2022-11-15 (×2): qty 2

## 2022-11-15 MED ORDER — FENTANYL 2500MCG IN NS 250ML (10MCG/ML) PREMIX INFUSION
50.0000 ug/h | INTRAVENOUS | Status: DC
  Administered 2022-11-15: 50 ug/h via INTRAVENOUS
  Filled 2022-11-15: qty 250

## 2022-11-15 MED ORDER — PROPOFOL 1000 MG/100ML IV EMUL
INTRAVENOUS | Status: AC
  Administered 2022-11-15: 25 ug/kg/min via INTRAVENOUS
  Filled 2022-11-15: qty 100

## 2022-11-15 MED ORDER — INSULIN ASPART 100 UNIT/ML IJ SOLN: 0.0000 [IU] | INTRAMUSCULAR | Status: DC

## 2022-11-15 MED ORDER — LEVETIRACETAM IN NACL 1000 MG/100ML IV SOLN
1000.0000 mg | Freq: Two times a day (BID) | INTRAVENOUS | Status: DC
  Administered 2022-11-15 – 2022-11-18 (×5): 1000 mg via INTRAVENOUS
  Filled 2022-11-15 (×5): qty 100

## 2022-11-15 MED ORDER — SODIUM CHLORIDE 0.9 % IV BOLUS
1000.0000 mL | Freq: Once | INTRAVENOUS | Status: AC
  Administered 2022-11-15: 1000 mL via INTRAVENOUS

## 2022-11-15 MED ORDER — PROPOFOL 1000 MG/100ML IV EMUL
5.0000 ug/kg/min | INTRAVENOUS | Status: DC
  Filled 2022-11-15: qty 100

## 2022-11-15 MED ORDER — LORAZEPAM 2 MG/ML PO CONC
2.0000 mg | Freq: Once | ORAL | Status: DC
  Administered 2022-11-15: 2 mg via ORAL

## 2022-11-15 MED ORDER — LORAZEPAM 2 MG/ML IJ SOLN
2.0000 mg | Freq: Once | INTRAMUSCULAR | Status: AC
  Administered 2022-11-15: 2 mg via INTRAVENOUS

## 2022-11-15 MED ORDER — FENTANYL 2500MCG IN NS 250ML (10MCG/ML) PREMIX INFUSION
50.0000 ug/h | INTRAVENOUS | Status: DC
  Administered 2022-11-15: 50 ug/h via INTRAVENOUS

## 2022-11-15 MED ORDER — MIDAZOLAM HCL 2 MG/2ML IJ SOLN
1.0000 mg | INTRAMUSCULAR | Status: DC | PRN
  Administered 2022-11-16 – 2022-11-17 (×4): 2 mg via INTRAVENOUS
  Filled 2022-11-15 (×5): qty 2

## 2022-11-15 MED ORDER — LORAZEPAM 2 MG/ML IJ SOLN
INTRAMUSCULAR | Status: AC
  Filled 2022-11-15: qty 1

## 2022-11-15 MED ORDER — NOREPINEPHRINE 4 MG/250ML-% IV SOLN
2.0000 ug/min | INTRAVENOUS | Status: DC
  Administered 2022-11-15: 5 ug/min via INTRAVENOUS

## 2022-11-15 MED ORDER — MIDAZOLAM HCL 2 MG/2ML IJ SOLN
2.0000 mg | Freq: Once | INTRAMUSCULAR | Status: AC
  Administered 2022-11-15: 2 mg via INTRAVENOUS

## 2022-11-15 MED ORDER — DOCUSATE SODIUM 100 MG PO CAPS: 100.0000 mg | ORAL_CAPSULE | Freq: Two times a day (BID) | ORAL | Status: DC | PRN

## 2022-11-15 MED ORDER — LORAZEPAM 2 MG/ML IJ SOLN
INTRAMUSCULAR | Status: AC
  Administered 2022-11-15: 2 mg via INTRAVENOUS
  Filled 2022-11-15: qty 1

## 2022-11-15 NOTE — Progress Notes (Signed)
RT received pt from [facility] and placed pt on ventilator.

## 2022-11-15 NOTE — H&P (Signed)
NAME:  Luis Hunter, MRN:  [id number], DOB:  09/17/1975, LOS: 0 ADMISSION DATE:  11/15/2022, CONSULTATION DATE:  11/15/22 REFERRING MD:  [facility], CHIEF COMPLAINT:  seizures   History of Present Illness:  HPI obtained from medical chart review as patient is intubated and sedated on mechanical ventilation.   45 yoM with PMH significant for seizures, polysubstance and tobacco, schizophrenia, depression who presented to [facility] after multiple witnessed seizures.  In ER, ongoing multiple seizures, intubated for airway protection.  CTH neg for acute process.  Loaded with keppra and vimpat, sedated on propofol.  Neurology consulted. Pt recently taken off vimpat in June due to balance and dizziness.  Patient to be transferred to [facility] for LTM.  PCCM to admit.   Pertinent  Medical History   Past Medical History:  Diagnosis Date   Bipolar disorder (HCC)    Depression    Schizophrenia (HCC)    Seizures (HCC)   Tobacco abuse, polysubstance abuse   Significant Hospital Events: Including procedures, antibiotic start and stop dates in addition to other pertinent events   7/12 [facility] ER> cone for SE, intubated  Interim History / Subjective:  Suspected seizure with carelink with upper body movements abated with versed.  Similar movements with upward gaze on arrival, abated without additional meds.  Hypotensive w/sedation, started on levophed  Objective   Height 5\' 11"  (1.803 m), weight 76 kg.    Vent Mode: PRVC FiO2 (%):  [28 %-100 %] 100 % Set Rate:  [16 bmp-18 bmp] 18 bmp Vt Set:  [480 mL-600 mL] 600 mL PEEP:  [5 cmH20] 5 cmH20 Plateau Pressure:  [11 cmH20] 11 cmH20  No intake or output data in the 24 hours ending 11/15/22 1620 Filed Weights   11/15/22 1600  Weight: 76 kg   Examination: General:  critically ill adult male sedated on MV HEENT: MM pink/moist, ETT pupils 3/sluggish, anicteric  Neuro: sedated, no response to noxious stimuli  CV: rr, NSR, no obvious murmur PULM:  MV supported,  clear  GI: soft, bs+, ND, foley- cyu Extremities: warm/dry, no LE edema  Skin: no rashes, tattoos  Resolved Hospital Problem list    Assessment & Plan:   Status epilepticus Hx seizures Hx polysubstance abuse  - Appreciate Neurology assistance - Respiratory support as below/ above - LTM - further imaging per Neuro - VPA level 50 - AEDs per Neuro> keppra 750 mg BID, depakote 250mg  BID, vimpat 100mg  BID - PAD protocol as below  - Maintain neuro protective measures; goal for eurothermia, euglycemia, eunatermia, normoxia, and PCO2 goal of 35-40 - Nutrition and bowel regiment  - Seizure precautions  - check UDS - CBG monitoring/ SSI  - empiric thiamine/ folic acid/ MVI (unclear ETOH hx)   Acute respiratory insufficiency related to above -  cont full MV support, 4-8cc/kg IBW with goal Pplat <30 and DP<15  - CXR  now, ABG in 1hr - VAP prevention protocol/ PPI - PAD protocol for sedation> propofol/ fentanyl gtt, switch over to versed gtt.  Also sedation will depend on LTM  - wean FiO2 as able for SpO2 >92% - daily SAT & SBT > hold till cleared by Neuro - pulmonary hygiene - prn BD   Hypotension likely related to sedation - afebrile.  WBC slightly elevated, could be reactive.  No obvious infiltrate on CXR, pending repeat, high risk for aspiration - check PCT, trend WBC/ fever curve - trending lactic acid - s/p 2L fluids.  Continue peripheral NE for MAP goal >  65 - send trach asp if able to collect sputum   AGMA At risk for AKI with hypotension - suspected lactic acidosis from recurrent seizures.  Check CK and lactate now.  K 3.8 earlier.  If lactate reassuring and ongoing AGMA, consider checking osmolality - s/p 2L IVF - cont foley - trend renal indices  - strict I/Os, daily wts - avoid nephrotoxins, renal dose meds, hemodynamic support as above   Mild diffuse STE - telemetry monitoring - trend trop hs, check ESR, check echo - bedside POCUS per attending> see his  note, no significant pericardial effusion.     Schizophrenia, depression, bipolar - pending home med review  Best Practice (right click and "Reselect all SmartList Selections" daily)   Diet/type: NPO DVT prophylaxis: prophylactic heparin  GI prophylaxis: PPI Lines: N/A Foley:  Yes, and it is still needed Code Status:  full code Last date of multidisciplinary goals of care discussion [pending]  Labs   CBC: Recent Labs  Lab 11/15/22 1026  WBC 10.6*  NEUTROABS 6.8  HGB 14.7  HCT 45.4  MCV 89.7  PLT 156    Basic Metabolic Panel: Recent Labs  Lab 11/15/22 1026  NA 138  K 3.8  CL 106  CO2 15*  GLUCOSE 124*  BUN 13  CREATININE 1.15  CALCIUM 9.0  MG 1.9   GFR: Estimated Creatinine Clearance: 83.7 mL/min (by C-G formula based on SCr of 1.15 mg/dL). Recent Labs  Lab 11/15/22 1026  WBC 10.6*    Liver Function Tests: Recent Labs  Lab 11/15/22 1026  AST 41  ALT 21  ALKPHOS 51  BILITOT 0.4  PROT 7.2  ALBUMIN 3.9   No results for input(s): "LIPASE", "AMYLASE" in the last 168 hours. No results for input(s): "AMMONIA" in the last 168 hours.  ABG    Component Value Date/Time   PHART 7.434 08/10/2021 1820   PCO2ART 37.1 08/10/2021 1820   PO2ART 99 08/10/2021 1820   HCO3 24.9 08/10/2021 1820   TCO2 26 08/10/2021 1820   ACIDBASEDEF 0.7 08/10/2021 1415   O2SAT 98 08/10/2021 1820     Coagulation Profile: No results for input(s): "INR", "PROTIME" in the last 168 hours.  Cardiac Enzymes: No results for input(s): "CKTOTAL", "CKMB", "CKMBINDEX", "TROPONINI" in the last 168 hours.  HbA1C: Hgb A1c MFr Bld  Date/Time Value Ref Range Status  04/26/2021 04:58 PM 4.8 4.8 - 5.6 % Final    Comment:    (NOTE) Pre diabetes:          5.7%-6.4%  Diabetes:              >6.4%  Glycemic control for   <7.0% adults with diabetes   11/20/2019 12:50 AM 5.8 (H) 4.8 - 5.6 % Final    Comment:    (NOTE) Pre diabetes:          5.7%-6.4%  Diabetes:               >6.4%  Glycemic control for   <7.0% adults with diabetes     CBG: No results for input(s): "GLUCAP" in the last 168 hours.  Review of Systems:   Unable   Past Medical History:  He,  has a past medical history of Bipolar disorder (HCC), Depression, Schizophrenia (HCC), and Seizures (HCC).   Surgical History:   Past Surgical History:  Procedure Laterality Date   FINGER SURGERY     IR GASTROSTOMY TUBE MOD SED  01/16/2021   LEFT HEART CATH AND CORONARY ANGIOGRAPHY N/A  12/25/2020   Procedure: LEFT HEART CATH AND CORONARY ANGIOGRAPHY;  Surgeon: Yvonne Kendall, MD;  Location: [facility] INVASIVE CV LAB;  Service: Cardiovascular;  Laterality: N/A;     Social History:   reports that he has been smoking. He has never used smokeless tobacco. He reports current drug use. Drugs: Marijuana and Cocaine. He reports that he does not drink alcohol.   Family History:  His family history is not on file.   Allergies Allergies  Allergen Reactions   Lacosamide     EOSINOPHILIA   Lacosamide Other (See Comments)    Eosinophilia   Valproic Acid And Related     hyperammonemia   Valproic Acid And Related Other (See Comments)    hyperannonemia   Lamictal [Lamotrigine] Rash     Home Medications  Prior to Admission medications   Medication Sig Start Date End Date Taking? Authorizing Provider  ARIPiprazole ER (ABILIFY MAINTENA) 400 MG PRSY prefilled syringe Inject 400 mg into the muscle every 30 (thirty) days. Resume after discharge from hospital. Do not take concurrently with PO/VT Abilify. 01/16/21   Tim Lair, PA-C  ARIPiprazole ER (ABILIFY MAINTENA) 400 MG PRSY prefilled syringe Inject 400 mg into the muscle every 28 (twenty-eight) days.    [provider]  levETIRAcetam (KEPPRA) 100 MG/ML solution Place 15 mLs (1,500 mg total) into feeding tube 2 (two) times daily. 06/06/21   Rhetta Mura, MD  levETIRAcetam (KEPPRA) 1000 MG tablet Take 1 tablet by mouth daily. Patient not  taking: Reported on 09/07/2021 06/05/21   [provider]  levETIRAcetam (KEPPRA) 750 MG tablet Take 2 tablets (1,500 mg total) by mouth 2 (two) times daily. 08/20/21   Danford, Earl Lites, MD  levETIRAcetam (KEPPRA) 750 MG tablet Take 2 tablets (1,500 mg total) by mouth 2 (two) times daily for 7 days. 08/14/21 08/22/21  Danford, Earl Lites, MD  ondansetron (ZOFRAN-ODT) 4 MG disintegrating tablet Take 1 tablet (4 mg total) by mouth every 6 (six) hours as needed for nausea or vomiting. 03/09/22   Sharyn Creamer, MD  OXcarbazepine (TRILEPTAL) 150 MG tablet Take 1 tablet (150 mg total) by mouth 2 (two) times daily. 06/06/21   Rhetta Mura, MD  sulfamethoxazole-trimethoprim (BACTRIM DS) 800-160 MG tablet Take 1 tablet by mouth every 12 (twelve) hours. Patient not taking: Reported on 09/07/2021 08/14/21   Alberteen Sam, MD     Critical care time: 45 mins       Posey Boyer, MSN, NP, AG-ACNP-BC [facility] 11/15/2022, 5:15 PM  See Amion for pager If no response to pager , please call [phone] until 7pm After 7:00 pm call Elink  [phone]

## 2022-11-15 NOTE — ED Notes (Signed)
Sz activity has abated/ resolved, no sz activity. Airway patent. No secretions to suction. HOB raised to 45 degrees. VSS. SPO2 95% RA. Xray at [facility].

## 2022-11-15 NOTE — ED Notes (Signed)
Report given to carelink via telephone. Transport to arrive in approx. 25 min.

## 2022-11-15 NOTE — Progress Notes (Signed)
LTM EEG hooked up and running - no initial skin breakdown - push button tested - [facility] monitoring.  

## 2022-11-15 NOTE — Progress Notes (Signed)
Spoke w/ Candise Bowens Pola Corn), per Dr. Everardo All "ok to use both ETT and NG".

## 2022-11-15 NOTE — Progress Notes (Addendum)
eLink Physician-Brief Progress Note Patient Name: Luis Hunter DOB: 07-08-1975 MRN: [id number]   Date of Service  11/15/2022  HPI/Events of Note  CXR reviewed. ETT 6 cm above carina. No acute infiltrate effusion or edema  eICU Interventions  Advance ETT 2 cm. Communicate with RN/RT Repeat CXR ordered >acceptable ETT position     Intervention Category Intermediate Interventions: Respiratory distress - evaluation and management  Elber Galyean Mechele Collin 11/15/2022, 6:53 PM

## 2022-11-15 NOTE — Progress Notes (Signed)
Upon arrival to the unit patient began to have whole body shaking and an upward gaze.  Dr. Denese Killings was alerted via securechat.  Orders were given for a versed gtt.

## 2022-11-15 NOTE — ED Notes (Signed)
Sz activity at this time. Keppra and ativan ordered. Dr. Larinda Buttery at [facility]. Will prepare to intubate.

## 2022-11-15 NOTE — ED Provider Notes (Signed)
-----------------------------------------   11:09 AM on 11/15/2022 ----------------------------------------- Dr. Larinda Buttery is currently in with a another critical patient.  Patient has had another seizure, given 2 mg IV Ativan.  Patient is extremely postictal, desats during seizures and at times in his postictal state.  Is actively gagging at times.  I do not believe he is capable of protecting his airway at this time and given his intermittent desaturations we will proceed with intubation for airway protection and sedate with propofol which should help protect from further seizures as well.  Neurology will be down shortly to see the patient they are currently finishing a procedure.  Patient is receiving 3 g of IV Keppra and Vimpat has been ordered but this is coming from pharmacy and we are awaiting arrival.  I will proceed with intubation of the patient for airway protection.  INTUBATION Performed by: Minna Antis  Required items: required blood products, implants, devices, and special equipment available Patient identity confirmed: provided demographic data and hospital-assigned identification number Time out: Immediately prior to procedure a "time out" was called to verify the correct patient, procedure, equipment, support staff and site/side marked as required.  Indications: Airway protection  Intubation method: S4 Glidescope Laryngoscopy   Preoxygenation: 100% BVM  Sedatives: 30 mg etomidate Paralytic: 120 mg succinylcholine  Tube Size: 8.0 cuffed  Post-procedure assessment: chest rise and ETCO2 monitor Breath sounds: equal and absent over the epigastrium Tube secured with: ETT holder Chest x-ray interpreted by radiologist and me.  Chest x-ray findings: endotracheal tube in appropriate position  Patient tolerated the procedure well with no immediate complications.     Minna Antis, MD 11/15/22 1506

## 2022-11-15 NOTE — ED Triage Notes (Signed)
BIB [facility] from home (there with others), called out for sz, upgraded to emergency traffic enroute for continued recurrent sz, 4th sz upon EMS arrival to scene, Versed 5mg  IM given R thigh and sz subsequently abated. Obtunded/ sedated/ posictal for EMS. VSS. 18g NSL R FA. BS 128. Oral trauma noted.

## 2022-11-15 NOTE — ED Notes (Signed)
EDP at [facility]. Fiance/ family arrives now. ETT explained with rationale.

## 2022-11-15 NOTE — ED Notes (Signed)
Pt. Reaching for tube with right arm, moving bilat legs. Dr. Larinda Buttery consulted about more sedation, or breakthrough sedation. See orders.

## 2022-11-15 NOTE — Progress Notes (Signed)
IV team consult for vasopressor US guided PIV.  There are no appropriate veins at this time for US guided for vasopressor. Pt. Has 3 working PIVs. Recommend putting iWatch on left lower FA. PIV flushed with great blood return. Notified RN.

## 2022-11-15 NOTE — Consult Note (Signed)
Neurology Consultation    Reason for Consult: Seizures  CC: Seizures  HISTORY OF PRESENT ILLNESS   HPI   Luis Hunter is a 47 y.o. male with a past medical history of depression, bipolar disorder, schizophrenia, and seizures who initially presented to [facility] on 7/12 with recurrent seizures.  He is currently managed with Keppra 750mg  BID and Depakote 250mg  BID. Depakote level is therapeutic, suggesting compliance. He was started on Vimpat in early June due to increasing auras, however he began having balance issues and dizziness so this was stopped.    From [facility] Note Today, he had multiple seizures, and was brought into the emergency department where he continued to have multiple seizures and was therefore intubated for airway protection after getting a Vimpat 200 mg and Keppra 60 mg/kg load as well as multiple doses of benzodiazepine, 6 mg of lorazepam total and 2 mg of Versed.  Enroute to [facility] he was thought to have had 2 more seizures and received versed from [facility]. He then had an episode of upper extremity twitching with upward gaze in 4N ICU witnessed by CCM NP that self resolved. He is currently on propofol gtt at a rate of 40 and requiring vasopressor support for his BP.   Previous AEDs: Lamictal - rash Trileptal- electrolyte abnormality Depakote (high dose)- upset stomach Vimpat- dizziness and balance issues  History is obtained from: Chart review   ROS: Review of systems not obtained due to patient factors.   PAST MEDICAL HISTORY    Past Medical History:  Past Medical History:  Diagnosis Date   Bipolar disorder (HCC)    Depression    Schizophrenia (HCC)    Seizures (HCC)     No family history on file. No family history on file.  Allergies:  Allergies  Allergen Reactions   Lacosamide     EOSINOPHILIA   Lacosamide Other (See Comments)    Eosinophilia   Valproic Acid And Related     hyperammonemia   Valproic Acid And Related Other (See Comments)     hyperannonemia   Lamictal [Lamotrigine] Rash    Social History:   reports that he has been smoking. He has never used smokeless tobacco. He reports current drug use. Drugs: Marijuana and Cocaine. He reports that he does not drink alcohol.    Medications Medications Prior to Admission  Medication Sig Dispense Refill   ARIPiprazole ER (ABILIFY MAINTENA) 400 MG PRSY prefilled syringe Inject 400 mg into the muscle every 30 (thirty) days. Resume after discharge from hospital. Do not take concurrently with PO/VT Abilify. 1 each    ARIPiprazole ER (ABILIFY MAINTENA) 400 MG PRSY prefilled syringe Inject 400 mg into the muscle every 28 (twenty-eight) days.     levETIRAcetam (KEPPRA) 100 MG/ML solution Place 15 mLs (1,500 mg total) into feeding tube 2 (two) times daily. 473 mL 12   levETIRAcetam (KEPPRA) 1000 MG tablet Take 1 tablet by mouth daily. (Patient not taking: Reported on 09/07/2021)     levETIRAcetam (KEPPRA) 750 MG tablet Take 2 tablets (1,500 mg total) by mouth 2 (two) times daily. 120 tablet 3   levETIRAcetam (KEPPRA) 750 MG tablet Take 2 tablets (1,500 mg total) by mouth 2 (two) times daily for 7 days. 30 tablet 0   ondansetron (ZOFRAN-ODT) 4 MG disintegrating tablet Take 1 tablet (4 mg total) by mouth every 6 (six) hours as needed for nausea or vomiting. 20 tablet 0   OXcarbazepine (TRILEPTAL) 150 MG tablet Take 1 tablet (150 mg total) by  mouth 2 (two) times daily. 60 tablet 3   sulfamethoxazole-trimethoprim (BACTRIM DS) 800-160 MG tablet Take 1 tablet by mouth every 12 (twelve) hours. (Patient not taking: Reported on 09/07/2021) 9 tablet 0    EXAMINATION    Current vital signs:    11/15/2022    4:00 PM 11/15/2022    3:55 PM 11/15/2022    3:00 PM  Vitals with BMI  Height  5\' 11"    Weight 167 lbs 9 oz    BMI 23.38    Systolic   106  Diastolic   93  Pulse   79    Examination:  GENERAL: Intubated, sedated  HEENT: - Normocephalic and atraumatic, dry mm, no lymphadenopathy, no  Thyromegally LUNGS - Regular, unlabored respirations CV - S1S2 RRR, equal pulses bilaterally. ABDOMEN - Soft, nontender, nondistended with normoactive BS Ext: warm, well perfused, intact peripheral pulses, no pedal edema Integumentary:  Skin intact on clothed exam  NEURO:  Mental Status: Intubated, sedated on propofol gtt Cranial Nerves:  II: Pupils sluggish, 2mm.  III, IV, VI: Eyes midline, corneals intact V: unable to assess due to sedation VII: ETT in place VIII: no response to verbal stimuli IX, X: Cough and gag intact [id number] gag XII: Does not perform tongue protrusion  Motor/sensory:  Does not respond to noxious stimuli in any extremity  Coordination: unable to perform  Gait- deferred    LABS   I have reviewed labs in epic and the results pertinent to this consultation are:  Lab Results  Component Value Date   LDLCALC 87 11/19/2019   Lab Results  Component Value Date   ALT 21 11/15/2022   AST 41 11/15/2022   ALKPHOS 51 11/15/2022   BILITOT 0.4 11/15/2022   Lab Results  Component Value Date   HGBA1C 4.8 04/26/2021   Lab Results  Component Value Date   WBC 10.6 (H) 11/15/2022   HGB 14.7 11/15/2022   HCT 45.4 11/15/2022   MCV 89.7 11/15/2022   PLT 156 11/15/2022   Lab Results  Component Value Date   VITAMINB12 884 12/27/2020   Lab Results  Component Value Date   FOLATE 8.0 12/27/2020   Lab Results  Component Value Date   NA 138 11/15/2022   K 3.8 11/15/2022   CL 106 11/15/2022   CO2 15 (L) 11/15/2022     DIAGNOSTIC IMAGING/PROCEDURES   I have reviewed the images obtained:, as below    CT-head- No acute intracranial abnormality  12/27/2020 MRI Brain WO No acute or focal intracranial finding. Minimal small vessel change of the hemispheric white matter. Mild increase in FLAIR signal within the dependent CSF, often seen in the setting of mechanical ventilation. Inflammatory changes of the paranasal sinuses. Bilateral mastoid  effusions.  12/22/2020 MRI Brain W WO No acute intracranial pathology or epileptogenic focus identified.   LTM EEG Pending    ASSESSMENT/PLAN    Assessment: 47 y.o. male with a pertinent medical history of depression, bipolar disorder, schizophrenia, and seizures who initially presented to [facility] on 7/12 with recurrent seizures and was subsequently intubated. He is currently on Keppra, Depakote, and vimpat. He was loaded with Vimpat 200mg  and Keppra 60mg /kg at [facility] prior to transfer. Will put him on LTM EEG to assess for possible electrographic status and for titration of seizure medications and propofol.   Impression: Breakthrough seizure  Status epilepticus, assess for resolution with LTM EEG  Recommendations: - Ventilator management per CCM  - Maintain LTM EEG - Continue Keppra 750mg  BID -  Continue Depakote 250mg  BID - Continue Vimpat 100mg  BID - UDS   -- Patient seen and examined by NP/APP with MD.  Elmer Picker, DNP, FNP-BC [facility] Pager: [phone]  I have seen and examined the patient. I have formulated the assessment and recommendations. 47 year old male with a history of epilepsy who presents with status epilepticus. Exam reveals a sedated patient with intact brainstem reflexes but no cortically mediated responses to stimuli. Recommendations as above.  Electronically signed: Dr. Caryl Pina

## 2022-11-15 NOTE — ED Notes (Signed)
Pt. Severly fighting tube, very agitated.

## 2022-11-15 NOTE — Progress Notes (Signed)
IV RN discussed IV watch placement for Levophed Infusion.  Was given direction to place levophed and IV watch sensor on L FA IV.

## 2022-11-15 NOTE — Progress Notes (Signed)
EEG complete - results pending 

## 2022-11-15 NOTE — ED Notes (Signed)
EMTALA reviewed by this RN, pt ready for transport  

## 2022-11-15 NOTE — Consult Note (Signed)
Neurology Consultation Reason for Consult: Seizures Referring Physician: Jill Alexanders, seen  CC: Seizures  History is obtained from: Family member  HPI: Luis Hunter is a 47 y.o. male family ember with a history of depression, bipolar disorder, schizophrenia who presents with recurrent seizures.  He supposed be taking Keppra and Depakote and his Depakote level is therapeutic, suggesting compliance.  He did have medication adjustment in June, where Vimpat was stopped due to dizziness.  He is currently on Keppra 750 twice daily and Depakote 250 twice daily and has had difficulty with increasing either of these in the past.  He has been on Lamictal (rash) Trileptal, electrolyte abnormality, and upset stomach with higher doses of Depakote.  Due to having increasing auras, he was started on Vimpat 50 twice daily in early June, but he had dizziness and balance issues and therefore stopped it.  Today, he had multiple seizures, and was brought into the emergency department where he continued to have multiple seizures and was therefore intubated for airway protection after getting a Vimpat 200 mg and Keppra 60 mg/kg load as well as multiple doses of benzodiazepine, 6 mg of lorazepam total and 2 mg of Versed.  Past Medical History:  Diagnosis Date   Bipolar disorder (HCC)    Depression    Schizophrenia (HCC)    Seizures (HCC)      No family history on file.   Social History:  reports that he has been smoking. He has never used smokeless tobacco. He reports current drug use. Drugs: Marijuana and Cocaine. He reports that he does not drink alcohol.   Exam: Current vital signs: BP 120/86   Pulse 92   Resp 13   Wt 79.4 kg   SpO2 94%   BMI 24.41 kg/m  Vital signs in last 24 hours: Pulse Rate:  [75-119] 92 (07/12 1250) Resp:  [0-28] 13 (07/12 1250) BP: (120-179)/(82-163) 120/86 (07/12 1250) SpO2:  [90 %-100 %] 94 % (07/12 1250) FiO2 (%):  [28 %] 28 % (07/12 1120) Weight:  [79.4 kg] 79.4 kg (07/12  1032)   Physical Exam  He was on 50 mcg/kg/min of propofol at the time of my exam  Neuro: Mental Status: He does not follow commands, but it does frequently reach to the tube with his right arm.   Cranial Nerves: II: He does not blink to threat. Pupils are equal, round, and reactive to light.   III,IV, VI: Doll's eyes intact V: VII: Blinks to eyelid stimulation bilaterally Motor: He does appear to move his right side slightly more than his left, but he does withdrawal to neck stimulation on the left as well.   Sensory: He responds to noxious stimulation in all four extremities  Cerebellar: Does not perform   I have reviewed labs in epic and the results pertinent to this consultation are: VPA level 50 Creatinine 1.15  I have reviewed the images obtained: CT head-unremarkable  Impression: 47 year old male with frequent recurrent seizures requiring intubation.  He still has a slightly asymmetric exam, and I will get a stat EEG to ensure that he is not in ongoing focal status.  Given that the reason for intubation was multiple focal recurrent seizures, I do think that transfer for LTM EEG to ensure adequate seizure control rather than trying to extubate here with possibility of needing to reintubate quickly would be prudent.  Recommendations: 1) continue Keppra 750 twice daily 2) continue Depakote 250 twice daily 3) continue Vimpat at 100 mg twice daily 4) LTM  EEG 5) neurology will follow  This patient is critically ill and at significant risk of neurological worsening, death and care requires constant monitoring of vital signs, hemodynamics,respiratory and cardiac monitoring, neurological assessment, discussion with family, other specialists and medical decision making of high complexity. I spent 35 minutes of neurocritical care time  in the care of  this patient. This was time spent independent of any time provided by nurse practitioner or PA.  Ritta Slot, MD [facility] [phone]  If 7pm- 7am, please page neurology on call as listed in [facility]ION. 11/15/2022  1:34 PM

## 2022-11-15 NOTE — ED Provider Notes (Signed)
[facility] Provider Note    Event Date/Time   First MD Initiated Contact with Patient 11/15/22 1024     (approximate)   History   Chief Complaint Seizures   HPI  Luis Hunter is a 47 y.o. male with past medical history of schizophrenia, bipolar disorder, and seizures who presents to the ED for seizures.  History is limited as patient is obtunded on arrival, history obtained from EMS.  Per EMS, family called after patient had 3 generalized tonic-clonic seizures back-to-back this morning.  They eventually called after the third seizure and patient had a fourth seizure shortly after arrival of EMS.  He was given 5 mg IM Versed with cessation of seizure activity after 1 to 2 minutes, remained obtunded during transport.  Family member on scene was unsure whether patient had been taking his seizure medications.     Physical Exam   Triage Vital Signs: ED Triage Vitals  Encounter Vitals Group     BP      Systolic BP Percentile      Diastolic BP Percentile      Pulse      Resp      Temp      Temp src      SpO2      Weight      Height      Head Circumference      Peak Flow      Pain Score      Pain Loc      Pain Education      Exclude from Growth Chart     Most recent vital signs: Vitals:   11/15/22 1210 11/15/22 1215  BP: (!) 152/89 (!) 150/112  Pulse: 87 (!) 102  Resp: (!) 23 19  SpO2: 97% 99%    Constitutional: Somnolent, no response to painful stimuli. Eyes: Gaze midline, conjunctivae are normal.  Pupils equal, round, and reactive to light bilaterally. Head: Atraumatic. Nose: No congestion/rhinnorhea. Mouth/Throat: Mucous membranes are moist.  Laceration noted to lateral tongue. Cardiovascular: Tachycardic, regular rhythm. Grossly normal heart sounds.  2+ radial pulses bilaterally. Respiratory: Normal respiratory effort.  No retractions. Lungs CTAB. Gastrointestinal: Soft and nontender. No distention. Musculoskeletal: No lower  extremity tenderness nor edema.  Neurologic: No response to verbal or painful stimuli.    ED Results / Procedures / Treatments   Labs (all labs ordered are listed, but only abnormal results are displayed) Labs Reviewed  CBC WITH DIFFERENTIAL/PLATELET - Abnormal; Notable for the following components:      Result Value   WBC 10.6 (*)    RDW 16.1 (*)    Abs Immature Granulocytes 0.14 (*)    All other components within normal limits  COMPREHENSIVE METABOLIC PANEL - Abnormal; Notable for the following components:   CO2 15 (*)    Glucose, Bld 124 (*)    Anion gap 17 (*)    All other components within normal limits  ETHANOL  MAGNESIUM  VALPROIC ACID LEVEL  URINE DRUG SCREEN, QUALITATIVE ([facility] ONLY)  LEVETIRACETAM LEVEL     EKG  ED ECG REPORT I, Chesley Noon, the attending physician, personally viewed and interpreted this ECG.   Date: 11/15/2022  EKG Time: 10:29  Rate: 110  Rhythm: sinus tachycardia  Axis: Normal  Intervals:none  ST&T Change: LVH  RADIOLOGY Chest x-ray reviewed and interpreted by me with no infiltrate, edema, or effusion.  PROCEDURES:  Critical Care performed: Yes, see critical care procedure note(s)  .Critical Care  Performed by: Chesley Noon, MD Authorized by: Chesley Noon, MD   Critical care provider statement:    Critical care time (minutes):  75   Critical care time was exclusive of:  Separately billable procedures and treating other patients and teaching time   Critical care was necessary to treat or prevent imminent or life-threatening deterioration of the following conditions:  CNS failure or compromise   Critical care was time spent personally by me on the following activities:  Development of treatment plan with patient or surrogate, discussions with consultants, evaluation of patient's response to treatment, examination of patient, ordering and review of laboratory studies, ordering and review of radiographic studies, ordering and  performing treatments and interventions, pulse oximetry, re-evaluation of patient's condition and review of old charts   I assumed direction of critical care for this patient from another provider in my specialty: no     Care discussed with: accepting provider at another facility      MEDICATIONS ORDERED IN ED: Medications  levETIRAcetam (KEPPRA) IVPB 1000 mg/100 mL premix (1,000 mg Intravenous Not Given 11/15/22 1126)  propofol (DIPRIVAN) 1000 MG/100ML infusion (60 mcg/kg/min  79.4 kg Intravenous Rate/Dose Change 11/15/22 1235)  fentaNYL (SUBLIMAZE) injection 50 mcg (has no administration in time range)  fentaNYL in NS (47mcg/ml) infusion-PREMIX (has no administration in time range)  fentaNYL (SUBLIMAZE) bolus via infusion 50-100 mcg (has no administration in time range)  sodium chloride 0.9 % bolus 1,000 mL (0 mLs Intravenous Stopped 11/15/22 1148)  LORazepam (ATIVAN) injection 2 mg (2 mg Intravenous Given 11/15/22 1041)  levETIRAcetam (KEPPRA) IVPB 1000 mg/100 mL premix (0 mg Intravenous Stopped 11/15/22 1113)  levETIRAcetam (KEPPRA) IVPB 1000 mg/100 mL premix (0 mg Intravenous Stopped 11/15/22 1104)  levETIRAcetam (KEPPRA) IVPB 1500 mg/ 100 mL premix (0 mg Intravenous Stopped 11/15/22 1123)  lacosamide (VIMPAT) 200 mg in sodium chloride 0.9 % 25 mL IVPB (0 mg Intravenous Stopped 11/15/22 1207)  succinylcholine (ANECTINE) 200 MG/10ML syringe (200 mg  Given 11/15/22 1116)  etomidate (AMIDATE) 2 MG/ML injection (30 mg  Given 11/15/22 1115)  propofol (DIPRIVAN) 1000 MG/100ML infusion (0 mcg/kg/min  Stopped 11/15/22 1202)  LORazepam (ATIVAN) injection 2 mg (2 mg Intravenous Given 11/15/22 1108)  midazolam (VERSED) injection 2 mg (2 mg Intravenous Given 11/15/22 1212)     IMPRESSION / MDM / ASSESSMENT AND PLAN / ED COURSE  I reviewed the triage vital signs and the nursing notes.                              47 y.o. male with past medical history of schizophrenia, bipolar disorder, and  seizures who presents to the ED for multiple seizures at home and one seizure with EMS, stopped following IM Versed.  Patient's presentation is most consistent with acute presentation with potential threat to life or bodily function.  Differential diagnosis includes, but is not limited to, status epilepticus, seizure disorder, medication noncompliance, sepsis, intracranial process, electrolyte abnormality.  Patient obtunded on arrival to the ED, does not respond to painful stimuli but gaze is midline with no findings concerning for ongoing seizure activity.  Shortly after arrival to the ED, patient did have an additional generalized tonic-clonic seizure that stopped after 2 mg of IV Ativan.  We will load with 60 mg/kg of IV Keppra, labs and CT head are pending at this time.  He apparently takes Keppra and Depakote, levels pending.  Case discussed with Dr. Amada Jupiter of neurology,  who agrees with plan for loading with IV Keppra and recommends follow-up Vimpat if no improvement.  We will continue to closely monitor patient, if he does not improve would require intubation for airway protection.  Labs without significant anemia, leukocytosis, tract abnormality, or AKI.  LFTs are unremarkable, Depakote level is low therapeutic.  Patient continued to have 2 additional seizures following Keppra load, he was given additional benzodiazepines along with IV Vimpat.  Dr. Lenard Lance assisted with intubation for airway protection while I was with another critical patient.  Case discussed with Dr. Merrily Pew in the ICU at [facility] for transfer due to need for continuous EEG monitoring.  Patient accepted for transfer, no ongoing seizure activity at this time as patient making purposeful movements and reaching for his ET tube.      FINAL CLINICAL IMPRESSION(S) / ED DIAGNOSES   Final diagnoses:  Status epilepticus (HCC)     Rx / DC Orders   ED Discharge Orders     None        Note:  This document was  prepared using Dragon voice recognition software and may include unintentional dictation errors.   Chesley Noon, MD 11/15/22 1247

## 2022-11-15 NOTE — ED Notes (Signed)
Airway patent. Intermittent gag. HOB 45 degree. 95% RA.

## 2022-11-15 NOTE — ED Notes (Signed)
Pt. Coughing and reaching up to pull at tube

## 2022-11-15 NOTE — ED Notes (Signed)
Attempted cath insertion, when blankets removed from pt's legs, he immediately clenched legs together and placed hands/arms forcefully over genitals. Will reattempt urinary cath when pt. Is more sedated.

## 2022-11-15 NOTE — Progress Notes (Signed)
Patient arrived to 4N31 with the following possessions; a pair of leather boots, sweatpants, tshirt, undies, and socks.  Items were searched and nothing else was found.

## 2022-11-15 NOTE — ED Notes (Signed)
[facility] given new bottle of propofol for transport and levophed for initiation if need to support pt's blood pressure. 1 Liter NS provided to [facility] to initiate for BP support as well.

## 2022-11-15 NOTE — Progress Notes (Signed)
S/p ETT advance and chest xray. Elink informed via Candise Bowens to inform Dr. Everardo All at this time.

## 2022-11-15 NOTE — ED Notes (Signed)
In CT, pt. Moving bilat legs and right arm when moved from stretcher to table.

## 2022-11-16 ENCOUNTER — Inpatient Hospital Stay (HOSPITAL_COMMUNITY): Payer: Medicare HMO

## 2022-11-16 DIAGNOSIS — R569 Unspecified convulsions: Secondary | ICD-10-CM | POA: Diagnosis not present

## 2022-11-16 DIAGNOSIS — I9589 Other hypotension: Secondary | ICD-10-CM | POA: Diagnosis not present

## 2022-11-16 LAB — RENAL FUNCTION PANEL
Albumin: 3.2 g/dL — ABNORMAL LOW (ref 3.5–5.0)
Anion gap: 6 (ref 5–15)
BUN: 9 mg/dL (ref 6–20)
CO2: 28 mmol/L (ref 22–32)
Calcium: 8.4 mg/dL — ABNORMAL LOW (ref 8.9–10.3)
Chloride: 106 mmol/L (ref 98–111)
Creatinine, Ser: 0.97 mg/dL (ref 0.61–1.24)
GFR, Estimated: >60 mL/min (ref >60)
Glucose, Bld: 90 mg/dL (ref 70–99)
Phosphorus: 2.8 mg/dL (ref 2.5–4.6)
Potassium: 4.1 mmol/L (ref 3.5–5.1)
Sodium: 140 mmol/L (ref 135–145)

## 2022-11-16 LAB — CBC
HCT: 38.9 % — ABNORMAL LOW (ref 39.0–52.0)
Hemoglobin: 13.1 g/dL (ref 13.0–17.0)
MCH: 29.9 pg (ref 26.0–34.0)
MCHC: 33.7 g/dL (ref 30.0–36.0)
MCV: 88.8 fL (ref 80.0–100.0)
Platelets: 101 10*3/uL — ABNORMAL LOW (ref 150–400)
RBC: 4.38 MIL/uL (ref 4.22–5.81)
RDW: 16.6 % — ABNORMAL HIGH (ref 11.5–15.5)
WBC: 12.9 10*3/uL — ABNORMAL HIGH (ref 4.0–10.5)
nRBC: 0 % (ref 0.0–0.2)

## 2022-11-16 LAB — ECHOCARDIOGRAM COMPLETE
Area-P 1/2: 3.65 cm2
Est EF: 45
Height: 71 in
S' Lateral: 3.3 cm
Single Plane A4C EF: 54.6 %
Weight: 2768.98 oz

## 2022-11-16 LAB — GLUCOSE, CAPILLARY
Glucose-Capillary: 96 mg/dL (ref 70–99)
Glucose-Capillary: 74 mg/dL (ref 70–99)
Glucose-Capillary: 100 mg/dL — ABNORMAL HIGH (ref 70–99)
Glucose-Capillary: 84 mg/dL (ref 70–99)
Glucose-Capillary: 86 mg/dL (ref 70–99)

## 2022-11-16 LAB — PROCALCITONIN: Procalcitonin: <0.1 ng/mL

## 2022-11-16 LAB — TRIGLYCERIDES: Triglycerides: 78 mg/dL (ref <150)

## 2022-11-16 LAB — MAGNESIUM: Magnesium: 2 mg/dL (ref 1.7–2.4)

## 2022-11-16 LAB — LEVETIRACETAM LEVEL: Levetiracetam Lvl: 24.9 ug/mL (ref 10.0–40.0)

## 2022-11-16 MED ORDER — DEXMEDETOMIDINE HCL IN NACL 400 MCG/100ML IV SOLN
0.0000 ug/kg/h | INTRAVENOUS | Status: DC
  Administered 2022-11-16: 0.4 ug/kg/h via INTRAVENOUS
  Administered 2022-11-17 (×2): 1.2 ug/kg/h via INTRAVENOUS
  Filled 2022-11-16 (×5): qty 100

## 2022-11-16 MED ORDER — VITAL AF 1.2 CAL PO LIQD
1000.0000 mL | ORAL | Status: DC
  Administered 2022-11-16: 1000 mL

## 2022-11-16 MED ORDER — CHLORHEXIDINE GLUCONATE CLOTH 2 % EX PADS
6.0000 | MEDICATED_PAD | Freq: Every day | CUTANEOUS | Status: DC
  Administered 2022-11-17 – 2022-11-18 (×4): 6 via TOPICAL

## 2022-11-16 MED ORDER — VALPROATE SODIUM 100 MG/ML IV SOLN
250.0000 mg | Freq: Two times a day (BID) | INTRAVENOUS | Status: DC
  Administered 2022-11-16 – 2022-11-17 (×4): 250 mg via INTRAVENOUS
  Filled 2022-11-16: qty 2.5
  Filled 2022-11-16: qty 250
  Filled 2022-11-16: qty 2.5
  Filled 2022-11-16: qty 250
  Filled 2022-11-16: qty 2.5

## 2022-11-16 MED ORDER — DEXMEDETOMIDINE HCL IN NACL 400 MCG/100ML IV SOLN
INTRAVENOUS | Status: AC
  Filled 2022-11-16: qty 100

## 2022-11-16 MED ORDER — VITAL HIGH PROTEIN PO LIQD: 1000.0000 mL | ORAL | Status: DC

## 2022-11-16 NOTE — H&P (Signed)
NAME:  Luis Hunter, MRN:  [id number], DOB:  Sep 13, 1975, LOS: 1 ADMISSION DATE:  11/15/2022, CONSULTATION DATE:  11/15/22 REFERRING MD:  [facility], CHIEF COMPLAINT:  seizures   History of Present Illness:  HPI obtained from medical chart review as patient is intubated and sedated on mechanical ventilation.   15 yoM with PMH significant for seizures, polysubstance and tobacco, schizophrenia, depression who presented to [facility] after multiple witnessed seizures.  In ER, ongoing multiple seizures, intubated for airway protection.  CTH neg for acute process.  Loaded with keppra and vimpat, sedated on propofol.  Neurology consulted. Pt recently taken off vimpat in June due to balance and dizziness.  Patient to be transferred to [facility] for LTM.  PCCM to admit.   Pertinent  Medical History   Past Medical History:  Diagnosis Date   Bipolar disorder (HCC)    Depression    Schizophrenia (HCC)    Seizures (HCC)   Tobacco abuse, polysubstance abuse   Significant Hospital Events: Including procedures, antibiotic start and stop dates in addition to other pertinent events   7/12 [facility] ER> cone for SE, intubated 7/13 continuous EEG shows no seizures.  Interim History / Subjective:  No further seizures since arrival.  Objective   Blood pressure 110/81, pulse 66, temperature 98.8 F (37.1 C), temperature source Axillary, resp. rate 18, height 5\' 11"  (1.803 m), weight 78.5 kg, SpO2 100%.    Vent Mode: PRVC FiO2 (%):  [40 %-100 %] 40 % Set Rate:  [18 bmp] 18 bmp Vt Set:  [600 mL] 600 mL PEEP:  [5 cmH20] 5 cmH20 Plateau Pressure:  [11 cmH20-14 cmH20] 14 cmH20   Intake/Output Summary (Last 24 hours) at 11/16/2022 1137 Last data filed at 11/16/2022 1100 Gross per 24 hour  Intake 1005.37 ml  Output 1875 ml  Net -869.63 ml   Filed Weights   11/15/22 1600 11/16/22 0545  Weight: 76 kg 78.5 kg   Examination: General:  critically ill adult male sedated on MV HEENT: MM pink/moist, ETT pupils 3/sluggish,  anicteric  Neuro: sedated, no response to noxious stimuli  CV: rr, NSR, no obvious murmur PULM:  MV supported, clear  GI: soft, bs+, ND, foley- cyu Extremities: warm/dry, no LE edema  Skin: no rashes, tattoos  Ancillary tests personally reviewed  Continuous EEG shows no seizures. Normal electrolytes. Mild leukocytosis 12.9 Mild thrombocytopenia 101.  Etiology unclear. Assessment & Plan:   Status epilepticus Hx seizures Hx polysubstance abuse  Demand related ischemia Schizophrenia, depression, bipolar  Plan:  -Wean sedation starting with Versed followed by propofol. -May need dexmedetomidine as bridge to extubation. -SBT once awake. -Continue levetiracetam and valproate.  Hold Vimpat as patient had disequilibrium from it. -Titrate norepinephrine to keep MAP greater than 65.  Best Practice (right click and "Reselect all SmartList Selections" daily)   Diet/type: NPO trickle tube feeds DVT prophylaxis: prophylactic heparin  GI prophylaxis: PPI Lines: N/A Foley:  Yes, and it is still needed Code Status:  full code Last date of multidisciplinary goals of care discussion [pending]  CRITICAL CARE Performed by: Lynnell Catalan   Total critical care time: 40 minutes  Critical care time was exclusive of separately billable procedures and treating other patients.  Critical care was necessary to treat or prevent imminent or life-threatening deterioration.  Critical care was time spent personally by me on the following activities: development of treatment plan with patient and/or surrogate as well as nursing, discussions with consultants, evaluation of patient's response to treatment, examination of patient, obtaining  history from patient or surrogate, ordering and performing treatments and interventions, ordering and review of laboratory studies, ordering and review of radiographic studies, pulse oximetry, re-evaluation of patient's condition and participation in multidisciplinary  rounds.  Lynnell Catalan, MD [facility] ICU Physician [facility]  Pager: [phone] Mobile: [phone] After hours: [phone].

## 2022-11-16 NOTE — Progress Notes (Signed)
RT NOTE: holding SBT on patient this AM.  Currently not breathing over set ventilator RR.  Tolerating current ventilator settings well at this time.  Will continue to monitor.

## 2022-11-16 NOTE — Progress Notes (Signed)
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  He was supposed be taking Keppra and Depakote and his Depakote level is therapeutic, suggesting compliance.  He did have medication adjustment in June, where Vimpat was stopped due to dizziness.  He is currently on Keppra 750 twice daily and Depakote 250 twice daily and has had difficulty with increasing either of these in the past.  He has been on Lamictal (rash) Trileptal, electrolyte abnormality, and upset stomach with higher doses of Depakote.  Due to having increasing auras, he was started on Vimpat 50 twice daily in early June, but he had dizziness and balance issues and therefore stopped it.   On Friday, he had multiple seizures, and was brought into the emergency department at [facility] where he continued to have multiple seizures and was therefore intubated for airway protection after getting a Vimpat 200 mg and Keppra 60 mg/kg load as well as multiple doses of benzodiazepine, 6 mg of lorazepam total and 2 mg of Versed. He was transferred to [facility] for EEG monitoring.   Subjective: Intubated and sedated on Precedex and fentanyl. Propofol and Versed stopped earlier today.   Objective: Current vital signs: BP (!) 156/105   Pulse (!) 48   Temp 98.1 F (36.7 C) (Oral)   Resp 18   Ht 5\' 11"  (1.803 m)   Wt 78.5 kg   SpO2 100%   BMI 24.14 kg/m  Vital signs in last 24 hours: Temp:  [97.9 F (36.6 C)-98.8 F (37.1 C)] 98.1 F (36.7 C) (07/13 2100) Pulse Rate:  [48-96] 48 (07/13 2200) Resp:  [16-18] 18 (07/13 2200) BP: (81-184)/(64-111) 156/105 (07/13 2200) SpO2:  [95 %-100 %] 100 % (07/13 2200) FiO2 (%):  [40 %] 40 % (07/13 2008) Weight:  [78.5 kg] 78.5 kg (07/13 0545)  Intake/Output from previous day: 07/12 0701 - 07/13 0700 In: 855.7 [I.V.:655.9; IV Piggyback:199.8] Out: 1875  [Urine:1875] Intake/Output this shift: Total I/O In: 98.6 [I.V.:58.6; NG/GT:40] Out: 75 [Urine:75] Nutritional status:  Diet Order             Diet NPO time specified  Diet effective now                  HEENT: [place]/AT Lungs: Intubated Ext: No edema  Neurologic Exam: Ment: Intubated and sedated on Precedex and fentanyl. No responses to any stimuli.  CN: Sluggish pupils. Eyes are conjugate without deviation. Corneals intact.  Motor/sensory:  Does not respond to noxious stimuli in any extremity  Coordination: Unable to perform  Gait: Unable to assess  Lab Results: Results for orders placed or performed during the hospital encounter of 11/15/22 (from the past 48 hour(s))  MRSA Next Gen by PCR, Nasal     Status: None   Collection Time: 11/15/22  4:02 PM   Specimen: Nasal Mucosa; Nasal Swab  Result Value Ref Range   MRSA by PCR Next Gen NOT DETECTED NOT DETECTED    Comment: (NOTE) The GeneXpert MRSA Assay (FDA approved for NASAL specimens only), is one component of a comprehensive MRSA colonization surveillance program. It is not intended to diagnose MRSA infection nor to guide or monitor treatment for MRSA infections. Test performance is not FDA approved in patients less than 27 years old. Performed at [facility], [address], [place], [place] [address]   Urinalysis, Routine w reflex microscopic -  Urine, Clean Catch     Status: Abnormal   Collection Time: 11/15/22  4:42 PM  Result Value Ref Range   Color, Urine STRAW (A) YELLOW   APPearance CLEAR CLEAR   Specific Gravity, Urine 1.005 1.005 - 1.030   pH 5.0 5.0 - 8.0   Glucose, UA NEGATIVE NEGATIVE mg/dL   Hgb urine dipstick NEGATIVE NEGATIVE   Bilirubin Urine NEGATIVE NEGATIVE   Ketones, ur NEGATIVE NEGATIVE mg/dL   Protein, ur NEGATIVE NEGATIVE mg/dL   Nitrite NEGATIVE NEGATIVE   Leukocytes,Ua NEGATIVE NEGATIVE    Comment: Performed at [facility], [address], [place], [place] [address]   Rapid urine drug screen (hospital performed)     Status: Abnormal   Collection Time: 11/15/22  4:42 PM  Result Value Ref Range   Opiates NONE DETECTED NONE DETECTED   Cocaine NONE DETECTED NONE DETECTED   Benzodiazepines POSITIVE (A) NONE DETECTED   Amphetamines NONE DETECTED NONE DETECTED   Tetrahydrocannabinol POSITIVE (A) NONE DETECTED   Barbiturates NONE DETECTED NONE DETECTED    Comment: (NOTE) DRUG SCREEN FOR MEDICAL PURPOSES ONLY.  IF CONFIRMATION IS NEEDED FOR ANY PURPOSE, NOTIFY LAB WITHIN 5 DAYS.  LOWEST DETECTABLE LIMITS FOR URINE DRUG SCREEN Drug Class                     Cutoff (ng/mL) Amphetamine and metabolites    1000 Barbiturate and metabolites    200 Benzodiazepine                 200 Opiates and metabolites        300 Cocaine and metabolites        300 THC                            50 Performed at [facility], [address] [place], [place] [address]   HIV Antibody (routine testing w rflx)     Status: None   Collection Time: 11/15/22  4:56 PM  Result Value Ref Range   HIV Screen 4th Generation wRfx Non Reactive Non Reactive    Comment: Performed at [facility], [address], [place], [place] [address]  Troponin I (High Sensitivity)     Status: Abnormal   Collection Time: 11/15/22  4:56 PM  Result Value Ref Range   Troponin I (High Sensitivity) 283 (HH) <18 ng/L    Comment: CRITICAL RESULT CALLED TO, READ BACK BY AND VERIFIED WITH N.PEARSE,RN @1758  11/15/2022 VANG.J (NOTE) Elevated high sensitivity troponin I (hsTnI) values and significant  changes across serial measurements may suggest ACS but many other  chronic and acute conditions are known to elevate hsTnI results.  Refer to the "Links" section for chest pain algorithms and additional  guidance. Performed at [facility], [address] [place], [place] [address]   Glucose, capillary     Status: None   Collection Time: 11/15/22  5:13 PM  Result Value Ref Range    Glucose-Capillary 96 70 - 99 mg/dL    Comment: Glucose reference range applies only to samples taken after fasting for at least 8 hours.  Draw ABG 1 hour after initiation of ventilator     Status: Abnormal   Collection Time: 11/15/22  5:30 PM  Result Value Ref Range   pH, Arterial 7.41 7.35 - 7.45   pCO2 arterial 41 32 - 48 mmHg   pO2, Arterial 161 (H)  83 - 108 mmHg   Bicarbonate 26.0 20.0 - 28.0 mmol/L   Acid-Base Excess 1.2 0.0 - 2.0 mmol/L   O2 Saturation 99.7 %   Patient temperature 37.0    Drawn by 164    Allens test (pass/fail) PASS PASS    Comment: Performed at [facility], [address] [place], [place] [address]  Procalcitonin     Status: None   Collection Time: 11/15/22  5:34 PM  Result Value Ref Range   Procalcitonin <0.10 ng/mL    Comment:        Interpretation: PCT (Procalcitonin) <= 0.5 ng/mL: Systemic infection (sepsis) is not likely. Local bacterial infection is possible. (NOTE)       Sepsis PCT Algorithm           Lower Respiratory Tract                                      Infection PCT Algorithm    ----------------------------     ----------------------------         PCT < 0.25 ng/mL                PCT < 0.10 ng/mL          Strongly encourage             Strongly discourage   discontinuation of antibiotics    initiation of antibiotics    ----------------------------     -----------------------------       PCT 0.25 - 0.50 ng/mL            PCT 0.10 - 0.25 ng/mL               OR       >80% decrease in PCT            Discourage initiation of                                            antibiotics      Encourage discontinuation           of antibiotics    ----------------------------     -----------------------------         PCT >= 0.50 ng/mL              PCT 0.26 - 0.50 ng/mL               AND        <80% decrease in PCT             Encourage initiation of                                             antibiotics       Encourage continuation            of antibiotics    ----------------------------     -----------------------------        PCT >= 0.50 ng/mL                  PCT > 0.50 ng/mL               AND  increase in PCT                  Strongly encourage                                      initiation of antibiotics    Strongly encourage escalation           of antibiotics                                     -----------------------------                                           PCT <= 0.25 ng/mL                                                 OR                                        > 80% decrease in PCT                                      Discontinue / Do not initiate                                             antibiotics  Performed at [facility], [address], [place], [place] [address]   Sedimentation rate     Status: None   Collection Time: 11/15/22  5:34 PM  Result Value Ref Range   Sed Rate 3 0 - 16 mm/hr    Comment: Performed at [facility], [address], [place], [place] [address]  Lactic acid, plasma     Status: Abnormal   Collection Time: 11/15/22  5:34 PM  Result Value Ref Range   Lactic Acid, Venous 2.3 (HH) 0.5 - 1.9 mmol/L    Comment: CRITICAL RESULT CALLED TO, READ BACK BY AND VERIFIED WITH Rebbeca Paul RN 11/15/2022 1905 BNUINNERY Performed at [facility], [address] [place], [place] [address]   CK     Status: None   Collection Time: 11/15/22  5:34 PM  Result Value Ref Range   Total CK 344 49 - 397 U/L    Comment: Performed at [facility], [address] [place], [place] [address]  Troponin I (High Sensitivity)     Status: Abnormal   Collection Time: 11/15/22  5:34 PM  Result Value Ref Range   Troponin I (High Sensitivity) 212 (HH) <18 ng/L    Comment: CRITICAL VALUE NOTED. VALUE IS CONSISTENT WITH PREVIOUSLY REPORTED/CALLED VALUE (NOTE) Elevated high sensitivity troponin I (hsTnI) values and significant  changes across serial measurements may suggest ACS  but many other  chronic and acute conditions are known to elevate  hsTnI results.  Refer to the "Links" section for chest pain algorithms and additional  guidance. Performed at [facility], [address], [place], [place] [address]   Lactic acid, plasma     Status: None   Collection Time: 11/15/22  7:59 PM  Result Value Ref Range   Lactic Acid, Venous 1.1 0.5 - 1.9 mmol/L    Comment: Performed at [facility], [address], [place], [place] [address]  Glucose, capillary     Status: None   Collection Time: 11/15/22  8:01 PM  Result Value Ref Range   Glucose-Capillary 99 70 - 99 mg/dL    Comment: Glucose reference range applies only to samples taken after fasting for at least 8 hours.  Glucose, capillary     Status: None   Collection Time: 11/15/22 11:37 PM  Result Value Ref Range   Glucose-Capillary 98 70 - 99 mg/dL    Comment: Glucose reference range applies only to samples taken after fasting for at least 8 hours.  Glucose, capillary     Status: None   Collection Time: 11/16/22  3:36 AM  Result Value Ref Range   Glucose-Capillary 96 70 - 99 mg/dL    Comment: Glucose reference range applies only to samples taken after fasting for at least 8 hours.  Glucose, capillary     Status: None   Collection Time: 11/16/22  7:31 AM  Result Value Ref Range   Glucose-Capillary 74 70 - 99 mg/dL    Comment: Glucose reference range applies only to samples taken after fasting for at least 8 hours.  Triglycerides     Status: None   Collection Time: 11/16/22  8:22 AM  Result Value Ref Range   Triglycerides 78 <150 mg/dL    Comment: Performed at [facility], [address], [place], [place] [address]  CBC     Status: Abnormal   Collection Time: 11/16/22  8:22 AM  Result Value Ref Range   WBC 12.9 (H) 4.0 - 10.5 K/uL   RBC 4.38 4.22 - 5.81 MIL/uL   Hemoglobin 13.1 13.0 - 17.0 g/dL   HCT 91.4 (L) 78.2 - 95.6 %   MCV 88.8 80.0 - 100.0 fL   MCH 29.9 26.0 - 34.0 pg   MCHC  33.7 30.0 - 36.0 g/dL   RDW 21.3 (H) 08.6 - 57.8 %   Platelets 101 (L) 150 - 400 K/uL    Comment: SPECIMEN CHECKED FOR CLOTS Immature Platelet Fraction may be clinically indicated, consider ordering this additional test [id number] REPEATED TO VERIFY PLATELET COUNT CONFIRMED BY SMEAR    nRBC 0.0 0.0 - 0.2 %    Comment: Performed at [facility], [address], [place], [place] [address]  Renal function panel     Status: Abnormal   Collection Time: 11/16/22  8:22 AM  Result Value Ref Range   Sodium 140 135 - 145 mmol/L   Potassium 4.1 3.5 - 5.1 mmol/L    Comment: HEMOLYSIS AT THIS LEVEL MAY AFFECT RESULT   Chloride 106 98 - 111 mmol/L   CO2 28 22 - 32 mmol/L   Glucose, Bld 90 70 - 99 mg/dL    Comment: Glucose reference range applies only to samples taken after fasting for at least 8 hours.   BUN 9 6 - 20 mg/dL   Creatinine, Ser 4.40 0.61 - 1.24 mg/dL   Calcium 8.4 (L) 8.9 - 10.3 mg/dL   Phosphorus 2.8 2.5 - 4.6 mg/dL  Albumin 3.2 (L) 3.5 - 5.0 g/dL   GFR, Estimated >16 >10 mL/min    Comment: (NOTE) Calculated using the CKD-EPI Creatinine Equation (2021)    Anion gap 6 5 - 15    Comment: Performed at [facility], [address], [place], [place] [address]  Magnesium     Status: None   Collection Time: 11/16/22  8:22 AM  Result Value Ref Range   Magnesium 2.0 1.7 - 2.4 mg/dL    Comment: Performed at [facility], [address] [place], [place] [address]  Procalcitonin     Status: None   Collection Time: 11/16/22  8:22 AM  Result Value Ref Range   Procalcitonin <0.10 ng/mL    Comment:        Interpretation: PCT (Procalcitonin) <= 0.5 ng/mL: Systemic infection (sepsis) is not likely. Local bacterial infection is possible. (NOTE)       Sepsis PCT Algorithm           Lower Respiratory Tract                                      Infection PCT Algorithm    ----------------------------     ----------------------------         PCT < 0.25 ng/mL                 PCT < 0.10 ng/mL          Strongly encourage             Strongly discourage   discontinuation of antibiotics    initiation of antibiotics    ----------------------------     -----------------------------       PCT 0.25 - 0.50 ng/mL            PCT 0.10 - 0.25 ng/mL               OR       >80% decrease in PCT            Discourage initiation of                                            antibiotics      Encourage discontinuation           of antibiotics    ----------------------------     -----------------------------         PCT >= 0.50 ng/mL              PCT 0.26 - 0.50 ng/mL               AND        <80% decrease in PCT             Encourage initiation of                                             antibiotics       Encourage continuation           of antibiotics    ----------------------------     -----------------------------        PCT >= 0.50 ng/mL  PCT > 0.50 ng/mL               AND         increase in PCT                  Strongly encourage                                      initiation of antibiotics    Strongly encourage escalation           of antibiotics                                     -----------------------------                                           PCT <= 0.25 ng/mL                                                 OR                                        > 80% decrease in PCT                                      Discontinue / Do not initiate                                             antibiotics  Performed at [facility], [address], [place], [place] [address]   Glucose, capillary     Status: Abnormal   Collection Time: 11/16/22 11:47 AM  Result Value Ref Range   Glucose-Capillary 100 (H) 70 - 99 mg/dL    Comment: Glucose reference range applies only to samples taken after fasting for at least 8 hours.  Glucose, capillary     Status: None   Collection Time: 11/16/22  3:02 PM  Result Value Ref Range   Glucose-Capillary  84 70 - 99 mg/dL    Comment: Glucose reference range applies only to samples taken after fasting for at least 8 hours.  Culture, Respiratory w Gram Stain     Status: None (Preliminary result)   Collection Time: 11/16/22  3:53 PM   Specimen: Tracheal Aspirate; Respiratory  Result Value Ref Range   Specimen Description TRACHEAL ASPIRATE    Special Requests NONE    Gram Stain      RARE SQUAMOUS EPITHELIAL CELLS PRESENT WBC PRESENT,BOTH PMN AND MONONUCLEAR ABUNDANT GRAM POSITIVE COCCI Performed at [facility], [address] [place], [place] [address]    Culture PENDING    Report Status PENDING   Glucose, capillary     Status: None   Collection Time: 11/16/22  8:58 PM  Result Value Ref Range  Glucose-Capillary 86 70 - 99 mg/dL    Comment: Glucose reference range applies only to samples taken after fasting for at least 8 hours.    Recent Results (from the past 240 hour(s))  MRSA Next Gen by PCR, Nasal     Status: None   Collection Time: 11/15/22  4:02 PM   Specimen: Nasal Mucosa; Nasal Swab  Result Value Ref Range Status   MRSA by PCR Next Gen NOT DETECTED NOT DETECTED Final    Comment: (NOTE) The GeneXpert MRSA Assay (FDA approved for NASAL specimens only), is one component of a comprehensive MRSA colonization surveillance program. It is not intended to diagnose MRSA infection nor to guide or monitor treatment for MRSA infections. Test performance is not FDA approved in patients less than 22 years old. Performed at [facility], [address] [place], [place] [address]   Culture, Respiratory w Gram Stain     Status: None (Preliminary result)   Collection Time: 11/16/22  3:53 PM   Specimen: Tracheal Aspirate; Respiratory  Result Value Ref Range Status   Specimen Description TRACHEAL ASPIRATE  Final   Special Requests NONE  Final   Gram Stain   Final    RARE SQUAMOUS EPITHELIAL CELLS PRESENT WBC PRESENT,BOTH PMN AND MONONUCLEAR ABUNDANT GRAM POSITIVE  COCCI Performed at [facility], [address], [place], [place] [address]    Culture PENDING  Incomplete   Report Status PENDING  Incomplete    Lipid Panel Recent Labs    11/16/22 9147  TRIG 78    Studies/Results: ECHOCARDIOGRAM COMPLETE  Result Date: 11/16/2022    ECHOCARDIOGRAM REPORT   Patient Name:   Luis Hunter Date of Exam: 11/16/2022 Medical Rec #:  [id number]     Height:       71.0 in Accession #:    [id number]    Weight:       173.1 lb Date of Birth:  02-15-1976     BSA:          1.983 m Patient Age:    47 years      BP:           110/81 mmHg Patient Gender: M             HR:           63 bpm. Exam Location:  Inpatient Procedure: 2D Echo Indications:    Hypotension  History:        Patient has prior history of Echocardiogram examinations, most                 recent 12/25/2020. Signs/Symptoms:seizures; Risk Factors:Current                 Smoker.  Sonographer:    Delcie Roch RDCS Referring Phys: [phone] PAULA B SIMPSON  Sonographer Comments: Echo performed with patient supine and on artificial respirator. Image acquisition challenging due to respiratory motion. IMPRESSIONS  1. Left ventricular ejection fraction, by estimation, is 45%. The left ventricle has mildly decreased function. The left ventricle has no regional wall motion abnormalities. Left ventricular diastolic parameters were normal.  2. Right ventricular systolic function is normal. The right ventricular size is normal.  3. Small to moderate circumferential pericardial effusion is present.  4. The mitral valve is normal in structure. Trivial mitral valve regurgitation. No evidence of mitral stenosis.  5. The aortic valve was not well visualized. Aortic valve regurgitation is not visualized. No aortic stenosis is present. Comparison(s):  Changes from prior study are noted. LVEF worsened from 50-55% to 45% now. FINDINGS  Left Ventricle: Left ventricular ejection fraction, by estimation, is 45%. The left ventricle has  mildly decreased function. The left ventricle has no regional wall motion abnormalities. The left ventricular internal cavity size was normal in size. There is no left ventricular hypertrophy. Left ventricular diastolic parameters were normal. Right Ventricle: The right ventricular size is normal. No increase in right ventricular wall thickness. Right ventricular systolic function is normal. Left Atrium: Left atrial size was normal in size. Right Atrium: Right atrial size was normal in size. Pericardium: Small to moderate circumferential pericardial effusion is present. Mitral Valve: The mitral valve is normal in structure. Trivial mitral valve regurgitation. No evidence of mitral valve stenosis. Tricuspid Valve: The tricuspid valve is normal in structure. Tricuspid valve regurgitation is trivial. No evidence of tricuspid stenosis. Aortic Valve: The aortic valve was not well visualized. Aortic valve regurgitation is not visualized. No aortic stenosis is present. Pulmonic Valve: The pulmonic valve was not well visualized. Pulmonic valve regurgitation is not visualized. No evidence of pulmonic stenosis. Aorta: The aortic root is normal in size and structure. Venous: IVC assessment for right atrial pressure unable to be performed due to mechanical ventilation. IAS/Shunts: No atrial level shunt detected by color flow Doppler.  LEFT VENTRICLE PLAX 2D LVIDd:         4.70 cm     Diastology LVIDs:         3.30 cm     LV e' medial:    9.03 cm/s LV PW:         1.10 cm     LV E/e' medial:  4.9 LV IVS:        1.00 cm     LV e' lateral:   9.90 cm/s LVOT diam:     2.30 cm     LV E/e' lateral: 4.4 LVOT Area:     4.15 cm  LV Volumes (MOD) LV vol d, MOD A4C: 95.1 ml LV vol s, MOD A4C: 43.2 ml LV SV MOD A4C:     95.1 ml RIGHT VENTRICLE            IVC RV Basal diam:  3.30 cm    IVC diam: 2.10 cm RV S prime:     9.57 cm/s TAPSE (M-mode): 1.8 cm LEFT ATRIUM           Index        RIGHT ATRIUM           Index LA diam:      2.70 cm 1.36  cm/m   RA Area:     13.90 cm LA Vol (A4C): 30.9 ml 15.58 ml/m  RA Volume:   37.40 ml  18.86 ml/m   AORTA Ao Root diam: 3.20 cm MITRAL VALVE MV Area (PHT): 3.65 cm    SHUNTS MV Decel Time: 208 msec    Systemic Diam: 2.30 cm MV E velocity: 44.00 cm/s MV A velocity: 39.20 cm/s MV E/A ratio:  1.12 Vishnu Priya Mallipeddi Electronically signed by Winfield Rast Mallipeddi Signature Date/Time: 11/16/2022/3:05:52 PM    Final    Overnight EEG with video  Result Date: 11/16/2022 Charlsie Quest, MD     11/16/2022  5:32 AM Patient Name: Luis Hunter MRN: [id number] Epilepsy Attending: Charlsie Quest Referring Physician/Provider: Caryl Pina, MD Duration: 11/15/2022 1926 to 11/16/2022 0530 Patient history:  47 y.o. male with a past medical history of depression, bipolar disorder,  schizophrenia, and seizures who initially presented with recurrent seizures. EEG to evaluate for seizure. Level of alertness:  comatose AEDs during EEG study: LEV, Propofol, versed Technical aspects: This EEG study was done with scalp electrodes positioned according to the 10-20 International system of electrode placement. Electrical activity was reviewed with band pass filter of 1-70Hz , sensitivity of 7 uV/mm, display speed of 48mm/sec with a 60Hz  notched filter applied as appropriate. EEG data were recorded continuously and digitally stored.  Video monitoring was available and reviewed as appropriate. Description: EEG showed continuous generalized and lateralized right hemisphere 3-7hz  theta-delta slowing admixed with 13-15Hz  fronto-central beta activity as well. Hyperventilation and photic stimulation were not performed.   ABNORMALITY - Continuous slow, generalized  and lateralized right hemisphere IMPRESSION: This study is suggestive of cortical dysfunction arising from right hemisphere likely secondary to underlying structural abnormality, post-ictal state. Additionally there is severe diffuse encephalopathy likely related to sedation.  No seizures or epileptiform discharges were seen throughout the recording. Charlsie Quest   DG CHEST PORT 1 VIEW  Result Date: 11/15/2022 CLINICAL DATA:  ETT adjustment EXAM: PORTABLE CHEST 1 VIEW COMPARISON:  11/15/2022, 03/09/2022 FINDINGS: Endotracheal tube tip about 4.9 cm superior to carina. Esophageal tube tip below the diaphragm but incompletely visualized. Clear lung fields. Stable cardiomediastinal silhouette IMPRESSION: Endotracheal tube tip about 4.9 cm superior to carina. Electronically Signed   By: Jasmine Pang M.D.   On: 11/15/2022 23:06   DG Abd Portable 1V  Result Date: 11/15/2022 CLINICAL DATA:  NG tube placement EXAM: PORTABLE ABDOMEN - 1 VIEW COMPARISON:  02/12/2021 FINDINGS: Tip and side port in the NG tube are noted within the fundus of the stomach. Bowel gas pattern is nonspecific. Pelvis is not included in its entirety. IMPRESSION: Tip of NG tube is seen in the fundus of the stomach. Electronically Signed   By: Ernie Avena M.D.   On: 11/15/2022 18:15   DG Chest Port 1 View  Result Date: 11/15/2022 CLINICAL DATA:  Endotracheal tube placement EXAM: PORTABLE CHEST 1 VIEW COMPARISON:  Previous studies including the examination done earlier today FINDINGS: Tip of endotracheal tube is 6 cm above the carina. Tip of NG tube is seen in the stomach. Transverse diameter of heart is increased. There are no signs of pulmonary edema or focal pulmonary consolidation. Apices of both lungs are not included in the radiographs. There is no pleural effusion or pneumothorax. IMPRESSION: There are no new infiltrates or signs of pulmonary edema. Tip of NG tube is seen in the fundus of the stomach. Electronically Signed   By: Ernie Avena M.D.   On: 11/15/2022 18:14   CT Head Wo Contrast  Result Date: 11/15/2022 CLINICAL DATA:  Seizure disorder, clinical change EXAM: CT HEAD WITHOUT CONTRAST TECHNIQUE: Contiguous axial images were obtained from the base of the skull through the  vertex without intravenous contrast. RADIATION DOSE REDUCTION: This exam was performed according to the departmental dose-optimization program which includes automated exposure control, adjustment of the mA and/or kV according to patient size and/or use of iterative reconstruction technique. COMPARISON:  CT head 03/09/22 FINDINGS: Brain: No evidence of acute infarction, hemorrhage, hydrocephalus, extra-axial collection or mass lesion/mass effect. Vascular: No hyperdense vessel or unexpected calcification. Skull: Normal. Negative for fracture or focal lesion. Sinuses/Orbits: No middle ear or mastoid effusion. Paranasal sinuses are clear. Orbits are unremarkable. Other: None. IMPRESSION: No acute intracranial abnormality. Electronically Signed   By: Lorenza Cambridge M.D.   On: 11/15/2022 13:27   DG Chest Portable  1 View  Result Date: 11/15/2022 CLINICAL DATA:  s/p intubation EXAM: PORTABLE CHEST 1 VIEW COMPARISON:  CXR 11/15/22 FINDINGS: Endotracheal tube terminates approximately 5 cm above carina. No pleural effusion. No pneumothorax. Unchanged cardiac and mediastinal contours. There are prominent bilateral interstitial opacities that could represent atypical infection or pulmonary venous congestion. No radiographically apparent displaced rib fracture. Visualized upper abdomen is unremarkable. IMPRESSION: Endotracheal tube terminates approximately 5 cm above carina. Electronically Signed   By: Lorenza Cambridge M.D.   On: 11/15/2022 12:10   DG Chest Portable 1 View  Result Date: 11/15/2022 CLINICAL DATA:  AMS EXAM: PORTABLE CHEST 1 VIEW COMPARISON:  CXR 03/09/22 FINDINGS: No pleural effusion. No pneumothorax. Normal cardiac and mediastinal contours. No focal airspace opacity. No radiographically apparent displaced rib fracture. Visualized upper abdomen is unremarkable. IMPRESSION: No focal airspace opacity Electronically Signed   By: Lorenza Cambridge M.D.   On: 11/15/2022 11:22    Medications: Scheduled:  Chlorhexidine  Gluconate Cloth  6 each Topical QHS   docusate  100 mg Per Tube BID   famotidine  20 mg Per Tube BID   folic acid  1 mg Intravenous Daily   heparin  5,000 Units Subcutaneous Q8H   insulin aspart  0-6 Units Subcutaneous Q4H   mouth rinse  15 mL Mouth Rinse Q2H   polyethylene glycol  17 g Per Tube Daily   thiamine (VITAMIN B1) injection  100 mg Intravenous Daily   Continuous:  sodium chloride Stopped (11/16/22 2156)   dexmedetomidine (PRECEDEX) IV infusion 0.6 mcg/kg/hr (11/16/22 2200)   feeding supplement (VITAL AF 1.2 CAL) 20 mL/hr at 11/16/22 2200   fentaNYL infusion INTRAVENOUS Stopped (11/16/22 1819)   levETIRAcetam Stopped (11/16/22 1849)   midazolam Stopped (11/16/22 0930)   norepinephrine (LEVOPHED) Adult infusion 2 mcg/min (11/16/22 2200)   propofol (DIPRIVAN) infusion Stopped (11/16/22 1316)   valproate sodium 52.5 mL/hr at 11/16/22 2200    Assessment: 47 y.o. male with a pertinent medical history of depression, bipolar disorder, schizophrenia, and seizures who initially presented to [facility] on 7/12 with recurrent seizures and was subsequently intubated. He is currently on Keppra and Depakote. He was loaded with Vimpat 200mg  and Keppra 60mg /kg at [facility] prior to transfer. Vimpat was not continued as a scheduled dose due to prior side effect of dizziness. He was continued on Keppra and VPA scheduled dosing. EtOH level was < 10 at [facility]. Not currently using alcohol. History of cocaine and marijuana use. UDS was positive for THC but negative for cocaine. - Exam today while on sedation is essentially unchanged from yesterday.  - WBC 12.9 today. Platelets low at 101.  - Keppra level therapeutic at 24.9 on Friday - VPA level 50 on Friday - LTM EEG report for today: Continuous slow, generalized and lateralized right hemisphere. This study is suggestive of cortical dysfunction arising from right hemisphere likely secondary to underlying structural abnormality, post-ictal state. Additionally  there is severe diffuse encephalopathy, likely related to sedation. No seizures or epileptiform discharges were seen throughout the recording. - Will continue LTM EEG to assess for possible electrographic seizure recurrence and for titration of seizure medications and propofol.    Impression: - Breakthrough seizure  - Status epilepticus, resolved on LTM EEG   Recommendations: - Ventilator management per CCM  - Maintain LTM EEG - Continue Keppra 1000 mg BID - Continue Depakote 250mg  BID - Frequent neuro checks - Attempt to wean sedation towards goal of extubation. If no seizures on LTM EEG after sedation has been  stopped, will discontinue LTM.   40 minutes spent in the neurological evaluation and management of this critically ill patient.   LOS: 1 day   @Electronically  signed: Dr. Caryl Pina 11/16/2022  10:52 PM

## 2022-11-16 NOTE — Procedures (Signed)
Patient Name: Luis Hunter  MRN: [id number]  Epilepsy Attending: Charlsie Quest  Referring Physician/Provider: Caryl Pina, MD  Duration: 11/15/2022 1926 to 11/16/2022 1926  Patient history:  47 y.o. male with a past medical history of depression, bipolar disorder, schizophrenia, and seizures who initially presented with recurrent seizures. EEG to evaluate for seizure.  Level of alertness:  comatose  AEDs during EEG study: LEV, Propofol, versed  Technical aspects: This EEG study was done with scalp electrodes positioned according to the 10-20 International system of electrode placement. Electrical activity was reviewed with band pass filter of 1-70Hz , sensitivity of 7 uV/mm, display speed of 21mm/sec with a 60Hz  notched filter applied as appropriate. EEG data were recorded continuously and digitally stored.  Video monitoring was available and reviewed as appropriate.  Description: EEG showed continuous generalized and lateralized right hemisphere 3-7hz  theta-delta slowing admixed with 13-15Hz  fronto-central beta activity as well. Hyperventilation and photic stimulation were not performed.     ABNORMALITY - Continuous slow, generalized  and lateralized right hemisphere   IMPRESSION: This study is suggestive of cortical dysfunction arising from right hemisphere likely secondary to underlying structural abnormality, post-ictal state. Additionally there is  severe diffuse encephalopathy likely related to sedation. No seizures or epileptiform discharges were seen throughout the recording.  Cloey Sferrazza Annabelle Harman

## 2022-11-16 NOTE — Progress Notes (Signed)
LTM maint complete - no skin breakdown seen under [id number] [facility] monitored, Event button test confirmed by [facility].

## 2022-11-16 NOTE — Progress Notes (Signed)
RT unable to obtain sputum specimen for culture. RN informed.

## 2022-11-16 NOTE — Plan of Care (Signed)
  Problem: Activity: Goal: Ability to tolerate increased activity will improve Outcome: Progressing   Problem: Respiratory: Goal: Ability to maintain a clear airway and adequate ventilation will improve Outcome: Progressing   Problem: Role Relationship: Goal: Method of communication will improve Outcome: Progressing   Problem: Safety: Goal: Non-violent Restraint(s) Outcome: Progressing

## 2022-11-16 NOTE — Plan of Care (Signed)
  Problem: Activity: Goal: Ability to tolerate increased activity will improve Outcome: Progressing   Problem: Respiratory: Goal: Ability to maintain a clear airway and adequate ventilation will improve Outcome: Progressing   Problem: Role Relationship: Goal: Method of communication will improve Outcome: Progressing   Problem: Safety: Goal: Non-violent Restraint(s) Outcome: Progressing   

## 2022-11-16 NOTE — Progress Notes (Signed)
Initial Nutrition Assessment  DOCUMENTATION CODES:   Not applicable  INTERVENTION:   -Trickle feeds via OGT per MD:  Vital 1.2 @ 20 ml/hr  Tube feeding regimen provides 576 kcal (30% of needs), 36 grams of protein, and 389 ml of H2O.    Once medically able, increase by 10 ml every 4 hours to goal rate of 65 ml/hr.   Tube feeding regimen provides 1872 kcal (100% of needs), 117 grams of protein, and 1265 ml of H2O.    NUTRITION DIAGNOSIS:   Inadequate oral intake related to inability to eat as evidenced by NPO status.  GOAL:   Patient will meet greater than or equal to 90% of their needs  MONITOR:   TF tolerance, Vent status  REASON FOR ASSESSMENT:   Consult Enteral/tube feeding initiation and management  ASSESSMENT:   Pt with PMH significant for seizures, polysubstance and tobacco, schizophrenia, depression who presented after multiple witnessed seizures.  Pt admitted with status ellipticus.   7/13- EEG revealed cortical dysfunction arising from right hemisphere likely secondary to underlying structural abnormality, post-ictal state and diffuse encephalopathy  Patient is currently intubated on ventilator support. OGT placed on 11/15/22, currently clamped; KUB on 11/15/22 reveals tip of tube in fundus of stomach.  MV: 10.2 L/min Temp (24hrs), Avg:98.2 F (36.8 C), Min:97.9 F (36.6 C), Max:98.8 F (37.1 C)  Propofol: 9.48 ml/hr (provides 250 kcals daily)  Reviewed I/O's: +39 ml x 24 hours and -981 ml since admission  Pt transferred from [facility] for LTM EEG.   Per MD, plan to initiate trickle feeds only today.    Reviewed wt hx; pt has experienced a 3.8% wt loss over the past 8 months, which is not significant for time frame.   Medications reviewed and include colace, folic acid, miralax, thiamine, keppra, versed, levophed, and propofol.  Lab Results  Component Value Date   HGBA1C 4.8 04/26/2021   PTA DM medications are .   Labs reviewed: CBGS: 74 (inpatient  orders for glycemic control are 0-6 units insulin aspart every 4 hours). Tox screen positive for benzodiazepine, cocaine, cannabinoid, and tetrahydrocannabinol.    Diet Order:   Diet Order             Diet NPO time specified  Diet effective now                   EDUCATION NEEDS:   Not appropriate for education at this time  Skin:  Skin Assessment: Reviewed RN Assessment  Last BM:  Unknown  Height:   Ht Readings from Last 1 Encounters:  11/15/22 5\' 11"  (1.803 m)    Weight:   Wt Readings from Last 1 Encounters:  11/16/22 78.5 kg    Ideal Body Weight:  78.2 kg  BMI:  Body mass index is 24.14 kg/m.  Estimated Nutritional Needs:   Kcal:  1913  Protein:  100-120 grams  Fluid:  1.9-2.1 L    Levada Schilling, RD, LDN, CDCES Registered Dietitian II Certified Diabetes Care and Education Specialist Please refer to [facility] for RD and/or RD on-call/weekend/after hours pager

## 2022-11-16 NOTE — Progress Notes (Signed)
RT NOTE: sputum sample obtained and sent down to main lab without complications.  

## 2022-11-16 NOTE — Progress Notes (Signed)
  Echocardiogram 2D Echocardiogram has been performed.  Delcie Roch 11/16/2022, 11:57 AM

## 2022-11-17 DIAGNOSIS — I5020 Unspecified systolic (congestive) heart failure: Secondary | ICD-10-CM

## 2022-11-17 DIAGNOSIS — R569 Unspecified convulsions: Secondary | ICD-10-CM | POA: Diagnosis not present

## 2022-11-17 DIAGNOSIS — R7989 Other specified abnormal findings of blood chemistry: Secondary | ICD-10-CM

## 2022-11-17 DIAGNOSIS — G40901 Epilepsy, unspecified, not intractable, with status epilepticus: Secondary | ICD-10-CM | POA: Diagnosis not present

## 2022-11-17 LAB — CBC
HCT: 43.3 % (ref 39.0–52.0)
Hemoglobin: 14.4 g/dL (ref 13.0–17.0)
MCH: 29.3 pg (ref 26.0–34.0)
MCHC: 33.3 g/dL (ref 30.0–36.0)
MCV: 88 fL (ref 80.0–100.0)
Platelets: 116 10*3/uL — ABNORMAL LOW (ref 150–400)
RBC: 4.92 MIL/uL (ref 4.22–5.81)
RDW: 16 % — ABNORMAL HIGH (ref 11.5–15.5)
WBC: 13.3 10*3/uL — ABNORMAL HIGH (ref 4.0–10.5)
nRBC: 0 % (ref 0.0–0.2)

## 2022-11-17 LAB — GLUCOSE, CAPILLARY
Glucose-Capillary: 108 mg/dL — ABNORMAL HIGH (ref 70–99)
Glucose-Capillary: 108 mg/dL — ABNORMAL HIGH (ref 70–99)
Glucose-Capillary: 110 mg/dL — ABNORMAL HIGH (ref 70–99)
Glucose-Capillary: 124 mg/dL — ABNORMAL HIGH (ref 70–99)
Glucose-Capillary: 126 mg/dL — ABNORMAL HIGH (ref 70–99)
Glucose-Capillary: 129 mg/dL — ABNORMAL HIGH (ref 70–99)

## 2022-11-17 LAB — RENAL FUNCTION PANEL
Albumin: 3.3 g/dL — ABNORMAL LOW (ref 3.5–5.0)
Anion gap: 10 (ref 5–15)
BUN: 12 mg/dL (ref 6–20)
CO2: 21 mmol/L — ABNORMAL LOW (ref 22–32)
Calcium: 8.8 mg/dL — ABNORMAL LOW (ref 8.9–10.3)
Chloride: 108 mmol/L (ref 98–111)
Creatinine, Ser: 0.93 mg/dL (ref 0.61–1.24)
GFR, Estimated: >60 mL/min (ref >60)
Glucose, Bld: 120 mg/dL — ABNORMAL HIGH (ref 70–99)
Phosphorus: 3.2 mg/dL (ref 2.5–4.6)
Potassium: 4.1 mmol/L (ref 3.5–5.1)
Sodium: 139 mmol/L (ref 135–145)

## 2022-11-17 MED ORDER — ORAL CARE MOUTH RINSE
15.0000 mL | OROMUCOSAL | Status: DC
  Administered 2022-11-18 – 2022-11-19 (×3): 15 mL via OROMUCOSAL

## 2022-11-17 MED ORDER — LOSARTAN POTASSIUM 50 MG PO TABS
50.0000 mg | ORAL_TABLET | Freq: Every day | ORAL | Status: DC
  Administered 2022-11-18: 50 mg
  Filled 2022-11-17: qty 1

## 2022-11-17 MED ORDER — FOLIC ACID 1 MG PO TABS
1.0000 mg | ORAL_TABLET | Freq: Every day | ORAL | Status: DC
  Administered 2022-11-18: 1 mg
  Filled 2022-11-17: qty 1

## 2022-11-17 MED ORDER — THIAMINE MONONITRATE 100 MG PO TABS
100.0000 mg | ORAL_TABLET | Freq: Every day | ORAL | Status: DC
  Administered 2022-11-18: 100 mg
  Filled 2022-11-17: qty 1

## 2022-11-17 MED ORDER — HYDRALAZINE HCL 20 MG/ML IJ SOLN
10.0000 mg | Freq: Four times a day (QID) | INTRAMUSCULAR | Status: DC | PRN
  Administered 2022-11-17 – 2022-11-19 (×2): 10 mg via INTRAVENOUS
  Filled 2022-11-17 (×2): qty 1

## 2022-11-17 NOTE — Consult Note (Signed)
Cardiology Consultation   Patient ID: DESMINE SATURNO MRN: [id number]; DOB: 1975-12-13  Admit date: 11/15/2022 Date of Consult: 11/17/2022  PCP:  [facility]   [facility] Providers Cardiologist:  End   Patient Profile:   Luis Hunter is a 47 y.o. male with a history of normal coronaries on cardiac catheterization in 12/2020, non-ischemic cardiomyopathy with mildly reduced EF of 40% in 11/2019 but improved to 50-55% in 12/2020, seizures, bipolar disorder, schizophrenia, depression, and polysubstance abuse who is being seen 11/17/2022 for the evaluation of elevated troponin and new cardiomyopathy at the request of Dr. Denese Killings.  History of Present Illness:   Luis Hunter is a 47 year old male with the above history. He was seen by Cardiology during an admission in 11/2019 for drug overdose after obtaining cocaine from a new source. He required Narcan. Initial troponin was elevated at 200 but repeat was normal. Echo showed LVEF of 40% with global hypokinesis but no regional wall motion abnormalities. Cardiomyopathy and elevated troponin were both felt to be due to polysubstance abuse. Medical therapy was recommended at that time. He was see again by Cardiology during an admission in 12/2020 for status epilepticus. EKG showed anterior ST elevations at that time. However, cardiac catheterization showed no angiographically significant CAD. Echo showed LVEF of 50-55% with no regional wall motion abnormalities, severe LVH, and a small pericardial effusion. EKG changes were felt to likely be due to abnormal repolarization in the setting of tachycardia. He has not been seen by Cardiology since that time. We have never seen him in the outpatient setting.  Patient was admitted at [facility] on 11/15/2022 for status epilepticus. Family called EMS after witnessing 3 generalized tonic-clonic seizure back to back that morning. Patient had a fourth seizure after arrival of EMS and IM Versed  was given with cessation. Family was unsure whether he was taking his medications. However, Depakote level was therapeutic in the ED suggesting compliance. Urine drug screen positive for THC and benzodiazepines. Patient was intubated in the ED and Neurology was consulted. He was started on Vimpat and continued on Keppra and Depakote. Head CT showed no acute findings. Patient was ultimately transferred for LTM. Cardiology is being consulted for mildly elevated troponin (283 >> 212) and abnormal Echo which showed LVEF of 45% with no regional wall motion abnormalities and a small to moderate pericardial effusion.  He is currently still a bit sleepy - was just extubated He denies any recent cocaine use  Wanted to know why he was in the hospital  No CP  Breathing is ok    Past Medical History:  Diagnosis Date   Bipolar disorder (HCC)    Depression    Schizophrenia (HCC)    Seizures (HCC)     Past Surgical History:  Procedure Laterality Date   FINGER SURGERY     IR GASTROSTOMY TUBE MOD SED  01/16/2021   LEFT HEART CATH AND CORONARY ANGIOGRAPHY N/A 12/25/2020   Procedure: LEFT HEART CATH AND CORONARY ANGIOGRAPHY;  Surgeon: Yvonne Kendall, MD;  Location: [facility] INVASIVE CV LAB;  Service: Cardiovascular;  Laterality: N/A;     Home Medications:  Prior to Admission medications   Medication Sig Start Date End Date Taking? Authorizing Provider  ARIPiprazole ER (ABILIFY MAINTENA) 400 MG PRSY prefilled syringe Inject 400 mg into the muscle every 30 (thirty) days. Resume after discharge from hospital. Do not take concurrently with PO/VT Abilify. 01/16/21   Tim Lair, PA-C  ARIPiprazole ER (  ABILIFY MAINTENA) 400 MG PRSY prefilled syringe Inject 400 mg into the muscle every 28 (twenty-eight) days.    [provider]  lacosamide (VIMPAT) 50 MG TABS tablet Take 50 mg by mouth 2 (two) times daily.    [provider]  levETIRAcetam (KEPPRA) 100 MG/ML solution Place 15 mLs (1,500 mg  total) into feeding tube 2 (two) times daily. 06/06/21   Rhetta Mura, MD  levETIRAcetam (KEPPRA) 1000 MG tablet Take 1 tablet by mouth daily. Patient not taking: Reported on 09/07/2021 06/05/21   [provider]  levETIRAcetam (KEPPRA) 750 MG tablet Take 2 tablets (1,500 mg total) by mouth 2 (two) times daily. 08/20/21   Danford, Earl Lites, MD  levETIRAcetam (KEPPRA) 750 MG tablet Take 2 tablets (1,500 mg total) by mouth 2 (two) times daily for 7 days. 08/14/21 08/22/21  Danford, Earl Lites, MD  ondansetron (ZOFRAN-ODT) 4 MG disintegrating tablet Take 1 tablet (4 mg total) by mouth every 6 (six) hours as needed for nausea or vomiting. 03/09/22   Sharyn Creamer, MD  OXcarbazepine (TRILEPTAL) 150 MG tablet Take 1 tablet (150 mg total) by mouth 2 (two) times daily. 06/06/21   Rhetta Mura, MD  sulfamethoxazole-trimethoprim (BACTRIM DS) 800-160 MG tablet Take 1 tablet by mouth every 12 (twelve) hours. Patient not taking: Reported on 09/07/2021 08/14/21   Alberteen Sam, MD    Inpatient Medications: Scheduled Meds:  Chlorhexidine Gluconate Cloth  6 each Topical QHS   famotidine  20 mg Per Tube BID   folic acid  1 mg Per Tube Daily   heparin  5,000 Units Subcutaneous Q8H   insulin aspart  0-6 Units Subcutaneous Q4H   mouth rinse  15 mL Mouth Rinse Q2H   thiamine  100 mg Per Tube Daily   Continuous Infusions:  sodium chloride 10 mL/hr at 11/17/22 0700   dexmedetomidine (PRECEDEX) IV infusion 1.2 mcg/kg/hr (11/17/22 0800)   feeding supplement (VITAL AF 1.2 CAL) 20 mL/hr at 11/17/22 0800   levETIRAcetam Stopped (11/17/22 0605)   norepinephrine (LEVOPHED) Adult infusion Stopped (11/16/22 1901)   valproate sodium 250 mg (11/17/22 1102)   PRN Meds: albuterol, docusate sodium, hydrALAZINE, mouth rinse, polyethylene glycol  Allergies:    Allergies  Allergen Reactions   Lacosamide     EOSINOPHILIA   Lacosamide Other (See Comments)    Eosinophilia   Valproic Acid And  Related     hyperammonemia   Valproic Acid And Related Other (See Comments)    hyperannonemia   Lamictal [Lamotrigine] Rash    Social History:   Social History   Socioeconomic History   Marital status: Single    Spouse name: Not on file   Number of children: Not on file   Years of education: Not on file   Highest education level: Not on file  Occupational History   Not on file  Tobacco Use   Smoking status: Every Day   Smokeless tobacco: Never  Substance and Sexual Activity   Alcohol use: No    Alcohol/week: 0.0 standard drinks of alcohol   Drug use: Yes    Types: Marijuana, Cocaine   Sexual activity: Not Currently  Other Topics Concern   Not on file  Social History Narrative   Not on file   Social Determinants of Health   Financial Resource Strain: Not on file  Food Insecurity: Not on file  Transportation Needs: Not on file  Physical Activity: Not on file  Stress: Not on file  Social Connections: Unknown (09/14/2021)  Received [facility]   Social Network    Social Network: Not on file  Intimate Partner Violence: Unknown (08/06/2021)   Received from [facility]   HITS    Physically Hurt: Not on file    Insult or Talk Down To: Not on file    Threaten Physical Harm: Not on file    Scream or Curse: Not on file    Family History:   No family history on file.   ROS:  Please see the history of present illness.   All other ROS reviewed and negative.     Physical Exam/Data:   Vitals:   11/17/22 0600 11/17/22 0700 11/17/22 0800 11/17/22 0900  BP: (!) 155/101 (!) 143/102 (!) 155/110 (!) 138/95  Pulse: 62 64 64 83  Resp: 18 18 18  (!) 25  Temp:   (!) 97.4 F (36.3 C)   TempSrc:   Axillary   SpO2: 97% 98% 98% 97%  Weight:      Height:        Intake/Output Summary (Last 24 hours) at 11/17/2022 1306 Last data filed at 11/17/2022 0800 Gross per 24 hour  Intake 1279.45 ml  Output 350 ml  Net 929.45 ml      11/17/2022     3:45 AM 11/16/2022    5:45 AM 11/15/2022    4:00 PM  Last 3 Weights  Weight (lbs) 173 lb 15.1 oz 173 lb 1 oz 167 lb 8.8 oz  Weight (kg) 78.9 kg 78.5 kg 76 kg     Body mass index is 24.26 kg/m.  General:  Well nourished, well developed, in no acute distress HEENT: normal Neck: no JVD Vascular: No carotid bruits; Distal pulses 2+ bilaterally Cardiac:  normal S1, S2; RRR; no murmur   Lungs:  clear to auscultation bilaterally, no wheezing, rhonchi or rales  Abd: soft, nontender, no hepatomegaly  Ext: no edema Musculoskeletal:  No deformities, BUE and BLE strength normal and equal Skin: warm and dry  Neuro:  CNs 2-12 intact, no focal abnormalities noted Psych:  Normal affect   EKG:  The EKG was personally reviewed and demonstrates:  Sinus tachycardia with LVH and early repolarization changes but no acute ST/T changes.  Telemetry:   NSR  Relevant CV Studies:  Left Cardiac Catheterization 12/25/2020: Conclusions: No angiographically significant coronary artery disease.  ST elevations on EKG most likely represent abnormal repolarization in the setting of tachycardia. Normal left ventricular systolic function with upper normal filling pressure.   Recommendations: Return to 23M-ICU for ongoing management. Primary prevention of coronary artery disease. Follow-up echocardiogram.  Diagnostic Dominance: Right    _______________  Echocardiogram 11/16/2022: 1. Left ventricular ejection fraction, by estimation, is 45%. The left  ventricle has mildly decreased function. The left ventricle has no  regional wall motion abnormalities. Left ventricular diastolic parameters  were normal.   2. Right ventricular systolic function is normal. The right ventricular  size is normal.   3. Small to moderate circumferential pericardial effusion is present.   4. The mitral valve is normal in structure. Trivial mitral valve  regurgitation. No evidence of mitral stenosis.   5. The aortic valve was  not well visualized. Aortic valve regurgitation  is not visualized. No aortic stenosis is present.   Comparison(s): Changes from prior study are noted. LVEF worsened from  50-55% to 45% now.   Laboratory Data:  High Sensitivity Troponin:   Recent Labs  Lab 11/15/22 1656 11/15/22 1734  TROPONINIHS 283* 212*  Chemistry Recent Labs  Lab 11/15/22 1026 11/16/22 0822 11/17/22 0423  NA 138 140 139  K 3.8 4.1 4.1  CL 106 106 108  CO2 15* 28 21*  GLUCOSE 124* 90 120*  BUN 13 9 12   CREATININE 1.15 0.97 0.93  CALCIUM 9.0 8.4* 8.8*  MG 1.9 2.0  --   GFRNONAA >60 >60 >60  ANIONGAP 17* 6 10    Recent Labs  Lab 11/15/22 1026 11/16/22 0822 11/17/22 0423  PROT 7.2  --   --   ALBUMIN 3.9 3.2* 3.3*  AST 41  --   --   ALT 21  --   --   ALKPHOS 51  --   --   BILITOT 0.4  --   --    Lipids  Recent Labs  Lab 11/16/22 0822  TRIG 78    Hematology Recent Labs  Lab 11/15/22 1026 11/16/22 0822 11/17/22 0423  WBC 10.6* 12.9* 13.3*  RBC 5.06 4.38 4.92  HGB 14.7 13.1 14.4  HCT 45.4 38.9* 43.3  MCV 89.7 88.8 88.0  MCH 29.1 29.9 29.3  MCHC 32.4 33.7 33.3  RDW 16.1* 16.6* 16.0*  PLT 156 101* 116*   Thyroid No results for input(s): "TSH", "FREET4" in the last 168 hours.  BNPNo results for input(s): "BNP", "PROBNP" in the last 168 hours.  DDimer No results for input(s): "DDIMER" in the last 168 hours.   Radiology/Studies:  ECHOCARDIOGRAM COMPLETE  Result Date: 11/16/2022    ECHOCARDIOGRAM REPORT   Patient Name:   Luis Hunter Date of Exam: 11/16/2022 Medical Rec #:  [id number]     Height:       71.0 in Accession #:    [id number]    Weight:       173.1 lb Date of Birth:  [birth year]     BSA:          1.983 m Patient Age:    47 years      BP:           110/81 mmHg Patient Gender: M             HR:           63 bpm. Exam Location:  Inpatient Procedure: 2D Echo Indications:    Hypotension  History:        Patient has prior history of Echocardiogram examinations, most                  recent 12/25/2020. Signs/Symptoms:seizures; Risk Factors:Current                 Smoker.  Sonographer:    Delcie Roch RDCS Referring Phys: [phone] PAULA B SIMPSON  Sonographer Comments: Echo performed with patient supine and on artificial respirator. Image acquisition challenging due to respiratory motion. IMPRESSIONS  1. Left ventricular ejection fraction, by estimation, is 45%. The left ventricle has mildly decreased function. The left ventricle has no regional wall motion abnormalities. Left ventricular diastolic parameters were normal.  2. Right ventricular systolic function is normal. The right ventricular size is normal.  3. Small to moderate circumferential pericardial effusion is present.  4. The mitral valve is normal in structure. Trivial mitral valve regurgitation. No evidence of mitral stenosis.  5. The aortic valve was not well visualized. Aortic valve regurgitation is not visualized. No aortic stenosis is present. Comparison(s): Changes from prior study are noted. LVEF worsened from 50-55% to 45% now. FINDINGS  Left Ventricle: Left ventricular ejection fraction, by estimation, is  45%. The left ventricle has mildly decreased function. The left ventricle has no regional wall motion abnormalities. The left ventricular internal cavity size was normal in size. There is no left ventricular hypertrophy. Left ventricular diastolic parameters were normal. Right Ventricle: The right ventricular size is normal. No increase in right ventricular wall thickness. Right ventricular systolic function is normal. Left Atrium: Left atrial size was normal in size. Right Atrium: Right atrial size was normal in size. Pericardium: Small to moderate circumferential pericardial effusion is present. Mitral Valve: The mitral valve is normal in structure. Trivial mitral valve regurgitation. No evidence of mitral valve stenosis. Tricuspid Valve: The tricuspid valve is normal in structure. Tricuspid valve regurgitation is  trivial. No evidence of tricuspid stenosis. Aortic Valve: The aortic valve was not well visualized. Aortic valve regurgitation is not visualized. No aortic stenosis is present. Pulmonic Valve: The pulmonic valve was not well visualized. Pulmonic valve regurgitation is not visualized. No evidence of pulmonic stenosis. Aorta: The aortic root is normal in size and structure. Venous: IVC assessment for right atrial pressure unable to be performed due to mechanical ventilation. IAS/Shunts: No atrial level shunt detected by color flow Doppler.  LEFT VENTRICLE PLAX 2D LVIDd:         4.70 cm     Diastology LVIDs:         3.30 cm     LV e' medial:    9.03 cm/s LV PW:         1.10 cm     LV E/e' medial:  4.9 LV IVS:        1.00 cm     LV e' lateral:   9.90 cm/s LVOT diam:     2.30 cm     LV E/e' lateral: 4.4 LVOT Area:     4.15 cm  LV Volumes (MOD) LV vol d, MOD A4C: 95.1 ml LV vol s, MOD A4C: 43.2 ml LV SV MOD A4C:     95.1 ml RIGHT VENTRICLE            IVC RV Basal diam:  3.30 cm    IVC diam: 2.10 cm RV S prime:     9.57 cm/s TAPSE (M-mode): 1.8 cm LEFT ATRIUM           Index        RIGHT ATRIUM           Index LA diam:      2.70 cm 1.36 cm/m   RA Area:     13.90 cm LA Vol (A4C): 30.9 ml 15.58 ml/m  RA Volume:   37.40 ml  18.86 ml/m   AORTA Ao Root diam: 3.20 cm MITRAL VALVE MV Area (PHT): 3.65 cm    SHUNTS MV Decel Time: 208 msec    Systemic Diam: 2.30 cm MV E velocity: 44.00 cm/s MV A velocity: 39.20 cm/s MV E/A ratio:  1.12 Vishnu Priya Mallipeddi Electronically signed by Winfield Rast Mallipeddi Signature Date/Time: 11/16/2022/3:05:52 PM    Final    Overnight EEG with video  Result Date: 11/16/2022 Charlsie Quest, MD     11/17/2022  8:04 AM Patient Name: Luis Hunter MRN: [id number] Epilepsy Attending: Charlsie Quest Referring Physician/Provider: Caryl Pina, MD Duration: 11/15/2022 1926 to 11/16/2022 1926 Patient history:  47 y.o. male with a past medical history of depression, bipolar disorder,  schizophrenia, and seizures who initially presented with recurrent seizures. EEG to evaluate for seizure. Level of alertness:  comatose AEDs during EEG study: LEV,  Propofol, versed Technical aspects: This EEG study was done with scalp electrodes positioned according to the 10-20 International system of electrode placement. Electrical activity was reviewed with band pass filter of 1-70Hz , sensitivity of 7 uV/mm, display speed of 35mm/sec with a 60Hz  notched filter applied as appropriate. EEG data were recorded continuously and digitally stored.  Video monitoring was available and reviewed as appropriate. Description: EEG showed continuous generalized and lateralized right hemisphere 3-7hz  theta-delta slowing admixed with 13-15Hz  fronto-central beta activity as well. Hyperventilation and photic stimulation were not performed.   ABNORMALITY - Continuous slow, generalized  and lateralized right hemisphere IMPRESSION: This study is suggestive of cortical dysfunction arising from right hemisphere likely secondary to underlying structural abnormality, post-ictal state. Additionally there is severe diffuse encephalopathy likely related to sedation. No seizures or epileptiform discharges were seen throughout the recording. Charlsie Quest   DG CHEST PORT 1 VIEW  Result Date: 11/15/2022 CLINICAL DATA:  ETT adjustment EXAM: PORTABLE CHEST 1 VIEW COMPARISON:  11/15/2022, 03/09/2022 FINDINGS: Endotracheal tube tip about 4.9 cm superior to carina. Esophageal tube tip below the diaphragm but incompletely visualized. Clear lung fields. Stable cardiomediastinal silhouette IMPRESSION: Endotracheal tube tip about 4.9 cm superior to carina. Electronically Signed   By: Jasmine Pang M.D.   On: 11/15/2022 23:06   DG Abd Portable 1V  Result Date: 11/15/2022 CLINICAL DATA:  NG tube placement EXAM: PORTABLE ABDOMEN - 1 VIEW COMPARISON:  02/12/2021 FINDINGS: Tip and side port in the NG tube are noted within the fundus of the  stomach. Bowel gas pattern is nonspecific. Pelvis is not included in its entirety. IMPRESSION: Tip of NG tube is seen in the fundus of the stomach. Electronically Signed   By: Ernie Avena M.D.   On: 11/15/2022 18:15   DG Chest Port 1 View  Result Date: 11/15/2022 CLINICAL DATA:  Endotracheal tube placement EXAM: PORTABLE CHEST 1 VIEW COMPARISON:  Previous studies including the examination done earlier today FINDINGS: Tip of endotracheal tube is 6 cm above the carina. Tip of NG tube is seen in the stomach. Transverse diameter of heart is increased. There are no signs of pulmonary edema or focal pulmonary consolidation. Apices of both lungs are not included in the radiographs. There is no pleural effusion or pneumothorax. IMPRESSION: There are no new infiltrates or signs of pulmonary edema. Tip of NG tube is seen in the fundus of the stomach. Electronically Signed   By: Ernie Avena M.D.   On: 11/15/2022 18:14   CT Head Wo Contrast  Result Date: 11/15/2022 CLINICAL DATA:  Seizure disorder, clinical change EXAM: CT HEAD WITHOUT CONTRAST TECHNIQUE: Contiguous axial images were obtained from the base of the skull through the vertex without intravenous contrast. RADIATION DOSE REDUCTION: This exam was performed according to the departmental dose-optimization program which includes automated exposure control, adjustment of the mA and/or kV according to patient size and/or use of iterative reconstruction technique. COMPARISON:  CT head 03/09/22 FINDINGS: Brain: No evidence of acute infarction, hemorrhage, hydrocephalus, extra-axial collection or mass lesion/mass effect. Vascular: No hyperdense vessel or unexpected calcification. Skull: Normal. Negative for fracture or focal lesion. Sinuses/Orbits: No middle ear or mastoid effusion. Paranasal sinuses are clear. Orbits are unremarkable. Other: None. IMPRESSION: No acute intracranial abnormality. Electronically Signed   By: Lorenza Cambridge M.D.   On:  11/15/2022 13:27   DG Chest Portable 1 View  Result Date: 11/15/2022 CLINICAL DATA:  s/p intubation EXAM: PORTABLE CHEST 1 VIEW COMPARISON:  CXR 11/15/22 FINDINGS: Endotracheal tube terminates  approximately 5 cm above carina. No pleural effusion. No pneumothorax. Unchanged cardiac and mediastinal contours. There are prominent bilateral interstitial opacities that could represent atypical infection or pulmonary venous congestion. No radiographically apparent displaced rib fracture. Visualized upper abdomen is unremarkable. IMPRESSION: Endotracheal tube terminates approximately 5 cm above carina. Electronically Signed   By: Lorenza Cambridge M.D.   On: 11/15/2022 12:10   DG Chest Portable 1 View  Result Date: 11/15/2022 CLINICAL DATA:  AMS EXAM: PORTABLE CHEST 1 VIEW COMPARISON:  CXR 03/09/22 FINDINGS: No pleural effusion. No pneumothorax. Normal cardiac and mediastinal contours. No focal airspace opacity. No radiographically apparent displaced rib fracture. Visualized upper abdomen is unremarkable. IMPRESSION: No focal airspace opacity Electronically Signed   By: Lorenza Cambridge M.D.   On: 11/15/2022 11:22     Assessment and Plan:   Elevated Troponin Non-Ischemic Cardiomyopathy Patient has a history of non-ischemic cardiomyopathy felt to be due to cocaine use. EF as low as 40% in 11/2019 but improved to 50-55% in 12/2020. LHC in 12/2020 showed normal coronaries. Patient now admitted with status epilepticus and cardiology consulted for elevated troponin and abnormal Echo. High-sensitivity troponin mildly elevated and flat at 283 >> 212. EKG shows sinus tachycardia with LVH and no acute ischemic changes (known repolarization changes). Echo showed LVEF of 45% with no regional wall motion abnormalities. Patient denies any cardiac symptoms. Troponin elevation consistent with demand ischemic from recurrent seizures and drop in EF consistent with this as well. No additional cardiac work-up necessary at this time.  Continue medical therapy. When able to take PO medications, could consider adding Losartan and Toprol-XL (he is no longer using cocaine).  Risk Assessment/Risk Scores:    New York Heart Association (NYHA) Functional Class NYHA Class I   For questions or updates, please contact [facility] Please consult [email or web address] for contact info under    Signed, Corrin Parker, PA-C  11/17/2022 1:06 PM   Attending Note:   The patient was seen and examined.  Agree with assessment and plan as noted above.  Changes made to the above note as needed.  Patient seen and independently examined with Marjie Skiff, PA .   We discussed all aspects of the encounter. I agree with the assessment and plan as stated above.    + troponins:  in the setting of status ellipticus .  Has a known hx of seizures.     He had a similar admission in 2022( seizures with + troponin) Cath at that time revealed smooth and normal coronaries   I suspect the current troponin levesl are due to multiple seizures and demand ischemia .   2. HFrEF:  only slightly worse than previus echo.  He is mildly hypertensive.    I would add losartan tomorrow and possibly add low dose beta blocker if possible      I have spent a total of 40 minutes with patient reviewing hospital  notes , telemetry, EKGs, labs and examining patient as well as establishing an assessment and plan that was discussed with the patient.  > 50% of time was spent in direct patient care.    Vesta Mixer, Montez Hageman., MD, [facility] 11/17/2022, 1:41 PM [address]  [address] Office [phone] Pager [phone]

## 2022-11-17 NOTE — Progress Notes (Addendum)
eLink Physician-Brief Progress Note Patient Name: Luis Hunter DOB: 01/06/1976 MRN: [id number]   Date of Service  11/17/2022  HPI/Events of Note  Bed side RN asking for posey belt.  Camera eval done. VS stable.  On nasal o2. Sats good.   Discussed with RN. Trying to get out of bed 2 to 3 times. Forgetfull, restless. Not in pain , no fever.  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 Extubated today.   eICU Interventions  Takes ablify at home.  Posey vest ordered Fall precautions      Intervention Category Minor Interventions: Agitation / anxiety - evaluation and management  Ranee Gosselin 11/17/2022, 10:45 PM  00:47 4 loose stools, asking for lomotil/immodium, asking for prn anti-anxiety IV  Camera: Awake and alert, nasal o2. Getting but cleaned by bed side RN. Anxious. - Imodium ordered and low dose xanax also. S/p extubation status, stable VS.

## 2022-11-17 NOTE — Progress Notes (Signed)
Brief review of HPI: Luis Hunter is a 47 y.o. male with a history of depression, bipolar disorder, schizophrenia who presented initially to [facility] on 7/12 with recurrent seizures.  He was supposed be taking Keppra and Depakote and his Depakote level is therapeutic, suggesting compliance.  He did have medication adjustment in June, where Vimpat was stopped due to dizziness.  He is currently on Keppra 750 twice daily and Depakote 250 twice daily and has had difficulty with increasing either of these in the past.  He has been on Lamictal (rash) Trileptal, electrolyte abnormality, and upset stomach with higher doses of Depakote.  Due to having increasing auras, he was started on Vimpat 50 twice daily in early June, but he had dizziness and balance issues and therefore stopped it.   On Friday, he had multiple seizures, and was brought into the emergency department at [facility] where he continued to have multiple seizures and was therefore intubated for airway protection after getting a Vimpat 200 mg and Keppra 60 mg/kg load as well as multiple doses of benzodiazepine, 6 mg of lorazepam total and 2 mg of Versed. He was transferred to [facility] for EEG monitoring.   Subjective: Extubated today. Having hallucinations about the pop music star Criss Alvine "on motorcycle in purple rain". He is aware of his hallucinations.   Objective: Current vital signs: BP (!) 138/95   Pulse 83   Temp (!) 97.4 F (36.3 C) (Axillary)   Resp (!) 25   Ht 5\' 11"  (1.803 m)   Wt 78.9 kg   SpO2 97%   BMI 24.26 kg/m  Vital signs in last 24 hours: Temp:  [97.4 F (36.3 C)-98.5 F (36.9 C)] 97.4 F (36.3 C) (07/14 0800) Pulse Rate:  [48-127] 83 (07/14 0900) Resp:  [16-25] 25 (07/14 0900) BP: (110-184)/(80-118) 138/95 (07/14 0900) SpO2:  [95 %-100 %] 97 % (07/14 0900) FiO2 (%):  [40 %] 40 % (07/14 0813) Weight:  [78.9 kg] 78.9 kg (07/14 0345)  Intake/Output from previous day: 07/13 0701 - 07/14 0700 In: 1414.6 [I.V.:763.8; NG/GT:346;  IV Piggyback:304.8] Out: 350 [Urine:350] Intake/Output this shift: Total I/O In: 43.5 [I.V.:23.5; NG/GT:20] Out: -  Nutritional status:  Diet Order             Diet NPO time specified  Diet effective now                   Neurologic Exam:  Physical Exam  Constitutional: Appears well-developed and well-nourished.   Cardiovascular: Normal rate and regular rhythm.  Respiratory: Effort normal, non-labored breathing  Neuro: Mental Status: Patient is awake, alert, oriented to person, place, year, and situation. He has some confusion regarding the month and the day. He is able able to give a coherent medical history.  Speech is at baseline- dysarthric Cranial Nerves: II: Visual Fields are full. Pupils are equal, round, and reactive to light.   III,IV, VI: EOMI without ptosis or diploplia.  V: Facial sensation is symmetric to temperature VII: Facial movement is slightly asymmetric- baseline per significant other VIII: Hearing is intact to voice X: Palate elevates symmetrically XI: Shoulder shrug is symmetric. XII: Tongue protrudes midline without atrophy or fasciculations.  Motor: Tone is normal. Bulk is normal. 5/5 strength was present in all four extremities.  Sensory: Sensation is symmetric to light touch and temperature in the arms and legs. No extinction to DSS present.  Cerebellar: FNF and HKS are intact bilaterally   Lab Results: Results for orders placed or performed during  the hospital encounter of 11/15/22 (from the past 48 hour(s))  MRSA Next Gen by PCR, Nasal     Status: None   Collection Time: 11/15/22  4:02 PM   Specimen: Nasal Mucosa; Nasal Swab  Result Value Ref Range   MRSA by PCR Next Gen NOT DETECTED NOT DETECTED    Comment: (NOTE) The GeneXpert MRSA Assay (FDA approved for NASAL specimens only), is one component of a comprehensive MRSA colonization surveillance program. It is not intended to diagnose MRSA infection nor to guide or monitor treatment  for MRSA infections. Test performance is not FDA approved in patients less than 15 years old. Performed at [facility], [address], [place], [place] [address]   Urinalysis, Routine w reflex microscopic -Urine, Clean Catch     Status: Abnormal   Collection Time: 11/15/22  4:42 PM  Result Value Ref Range   Color, Urine STRAW (A) YELLOW   APPearance CLEAR CLEAR   Specific Gravity, Urine 1.005 1.005 - 1.030   pH 5.0 5.0 - 8.0   Glucose, UA NEGATIVE NEGATIVE mg/dL   Hgb urine dipstick NEGATIVE NEGATIVE   Bilirubin Urine NEGATIVE NEGATIVE   Ketones, ur NEGATIVE NEGATIVE mg/dL   Protein, ur NEGATIVE NEGATIVE mg/dL   Nitrite NEGATIVE NEGATIVE   Leukocytes,Ua NEGATIVE NEGATIVE    Comment: Performed at [facility], [address] [place], [place] [address]  Rapid urine drug screen (hospital performed)     Status: Abnormal   Collection Time: 11/15/22  4:42 PM  Result Value Ref Range   Opiates NONE DETECTED NONE DETECTED   Cocaine NONE DETECTED NONE DETECTED   Benzodiazepines POSITIVE (A) NONE DETECTED   Amphetamines NONE DETECTED NONE DETECTED   Tetrahydrocannabinol POSITIVE (A) NONE DETECTED   Barbiturates NONE DETECTED NONE DETECTED    Comment: (NOTE) DRUG SCREEN FOR MEDICAL PURPOSES ONLY.  IF CONFIRMATION IS NEEDED FOR ANY PURPOSE, NOTIFY LAB WITHIN 5 DAYS.  LOWEST DETECTABLE LIMITS FOR URINE DRUG SCREEN Drug Class                     Cutoff (ng/mL) Amphetamine and metabolites    1000 Barbiturate and metabolites    200 Benzodiazepine                 200 Opiates and metabolites        300 Cocaine and metabolites        300 THC                            50 Performed at [facility] Lab, [address] [place], [place] [address]   HIV Antibody (routine testing w rflx)     Status: None   Collection Time: 11/15/22  4:56 PM  Result Value Ref Range   HIV Screen 4th Generation wRfx Non Reactive Non Reactive    Comment: Performed at [facility],  [address] [place], [place] [address]  Troponin I (High Sensitivity)     Status: Abnormal   Collection Time: 11/15/22  4:56 PM  Result Value Ref Range   Troponin I (High Sensitivity) 283 (HH) <18 ng/L    Comment: CRITICAL RESULT CALLED TO, READ BACK BY AND VERIFIED WITH N.PEARSE,RN @1758  11/15/2022 VANG.J (NOTE) Elevated high sensitivity troponin I (hsTnI) values and significant  changes across serial measurements may suggest ACS but many other  chronic and acute conditions are known to elevate hsTnI  results.  Refer to the "Links" section for chest pain algorithms and additional  guidance. Performed at [facility], [address], [place], [place] [address]   Glucose, capillary     Status: None   Collection Time: 11/15/22  5:13 PM  Result Value Ref Range   Glucose-Capillary 96 70 - 99 mg/dL    Comment: Glucose reference range applies only to samples taken after fasting for at least 8 hours.  Draw ABG 1 hour after initiation of ventilator     Status: Abnormal   Collection Time: 11/15/22  5:30 PM  Result Value Ref Range   pH, Arterial 7.41 7.35 - 7.45   pCO2 arterial 41 32 - 48 mmHg   pO2, Arterial 161 (H) 83 - 108 mmHg   Bicarbonate 26.0 20.0 - 28.0 mmol/L   Acid-Base Excess 1.2 0.0 - 2.0 mmol/L   O2 Saturation 99.7 %   Patient temperature 37.0    Drawn by 164    Allens test (pass/fail) PASS PASS    Comment: Performed at [facility], [address], [place], [place] [address]  Procalcitonin     Status: None   Collection Time: 11/15/22  5:34 PM  Result Value Ref Range   Procalcitonin <0.10 ng/mL    Comment:        Interpretation: PCT (Procalcitonin) <= 0.5 ng/mL: Systemic infection (sepsis) is not likely. Local bacterial infection is possible. (NOTE)       Sepsis PCT Algorithm           Lower Respiratory Tract                                      Infection PCT Algorithm    ----------------------------     ----------------------------         PCT < 0.25 ng/mL                 PCT < 0.10 ng/mL          Strongly encourage             Strongly discourage   discontinuation of antibiotics    initiation of antibiotics    ----------------------------     -----------------------------       PCT 0.25 - 0.50 ng/mL            PCT 0.10 - 0.25 ng/mL               OR       >80% decrease in PCT            Discourage initiation of                                            antibiotics      Encourage discontinuation           of antibiotics    ----------------------------     -----------------------------         PCT >= 0.50 ng/mL              PCT 0.26 - 0.50 ng/mL               AND        <80% decrease in PCT  Encourage initiation of                                             antibiotics       Encourage continuation           of antibiotics    ----------------------------     -----------------------------        PCT >= 0.50 ng/mL                  PCT > 0.50 ng/mL               AND         increase in PCT                  Strongly encourage                                      initiation of antibiotics    Strongly encourage escalation           of antibiotics                                     -----------------------------                                           PCT <= 0.25 ng/mL                                                 OR                                        > 80% decrease in PCT                                      Discontinue / Do not initiate                                             antibiotics  Performed at [facility], [address] [place], [place] [address]   Sedimentation rate     Status: None   Collection Time: 11/15/22  5:34 PM  Result Value Ref Range   Sed Rate 3 0 - 16 mm/hr    Comment: Performed at [facility], [address], [place], [place] [address]  Lactic acid, plasma     Status: Abnormal   Collection Time: 11/15/22  5:34 PM  Result Value Ref Range   Lactic Acid, Venous 2.3 (HH) 0.5 -  1.9 mmol/L    Comment: CRITICAL RESULT CALLED TO, READ BACK BY AND VERIFIED WITH Rebbeca Paul RN 11/15/2022 1905 BNUINNERY Performed at [facility], [address] [place], [place] [address]   CK     Status: None   Collection Time: 11/15/22  5:34 PM  Result Value Ref Range   Total CK 344 49 - 397 U/L    Comment: Performed at [facility], [address], [place], [place] [address]  Troponin I (High Sensitivity)     Status: Abnormal   Collection Time: 11/15/22  5:34 PM  Result Value Ref Range   Troponin I (High Sensitivity) 212 (HH) <18 ng/L    Comment: CRITICAL VALUE NOTED. VALUE IS CONSISTENT WITH PREVIOUSLY REPORTED/CALLED VALUE (NOTE) Elevated high sensitivity troponin I (hsTnI) values and significant  changes across serial measurements may suggest ACS but many other  chronic and acute conditions are known to elevate hsTnI results.  Refer to the "Links" section for chest pain algorithms and additional  guidance. Performed at [facility], [address], [place], [place] [address]   Lactic acid, plasma     Status: None   Collection Time: 11/15/22  7:59 PM  Result Value Ref Range   Lactic Acid, Venous 1.1 0.5 - 1.9 mmol/L    Comment: Performed at [facility], [address] [place], [place] [address]  Glucose, capillary     Status: None   Collection Time: 11/15/22  8:01 PM  Result Value Ref Range   Glucose-Capillary 99 70 - 99 mg/dL    Comment: Glucose reference range applies only to samples taken after fasting for at least 8 hours.  Glucose, capillary     Status: None   Collection Time: 11/15/22 11:37 PM  Result Value Ref Range   Glucose-Capillary 98 70 - 99 mg/dL    Comment: Glucose reference range applies only to samples taken after fasting for at least 8 hours.  Glucose, capillary     Status: None   Collection Time: 11/16/22  3:36 AM  Result Value Ref Range   Glucose-Capillary 96 70 - 99 mg/dL    Comment: Glucose reference range applies only to  samples taken after fasting for at least 8 hours.  Glucose, capillary     Status: None   Collection Time: 11/16/22  7:31 AM  Result Value Ref Range   Glucose-Capillary 74 70 - 99 mg/dL    Comment: Glucose reference range applies only to samples taken after fasting for at least 8 hours.  Triglycerides     Status: None   Collection Time: 11/16/22  8:22 AM  Result Value Ref Range   Triglycerides 78 <150 mg/dL    Comment: Performed at [facility], [address] [place], [place] [address]  CBC     Status: Abnormal   Collection Time: 11/16/22  8:22 AM  Result Value Ref Range   WBC 12.9 (H) 4.0 - 10.5 K/uL   RBC 4.38 4.22 - 5.81 MIL/uL   Hemoglobin 13.1 13.0 - 17.0 g/dL   HCT 13.0 (L) 86.5 - 78.4 %   MCV 88.8 80.0 - 100.0 fL   MCH 29.9 26.0 - 34.0 pg   MCHC 33.7 30.0 - 36.0 g/dL   RDW 69.6 (H) 29.5 - 28.4 %   Platelets 101 (L) 150 - 400 K/uL    Comment: SPECIMEN CHECKED FOR CLOTS Immature Platelet Fraction may be clinically indicated, consider ordering this additional test [id number] REPEATED TO VERIFY PLATELET COUNT CONFIRMED BY SMEAR    nRBC 0.0 0.0 - 0.2 %    Comment: Performed at [facility], [address], [place], [place] [address]  Renal function panel     Status: Abnormal   Collection Time: 11/16/22  8:22 AM  Result Value Ref Range   Sodium 140 135 - 145 mmol/L   Potassium 4.1 3.5 - 5.1 mmol/L    Comment: HEMOLYSIS AT THIS LEVEL MAY AFFECT RESULT   Chloride 106 98 - 111 mmol/L   CO2 28 22 - 32 mmol/L   Glucose, Bld 90 70 - 99 mg/dL    Comment: Glucose reference range applies only to samples taken after fasting for at least 8 hours.   BUN 9 6 - 20 mg/dL   Creatinine, Ser 4.78 0.61 - 1.24 mg/dL   Calcium 8.4 (L) 8.9 - 10.3 mg/dL   Phosphorus 2.8 2.5 - 4.6 mg/dL   Albumin 3.2 (L) 3.5 - 5.0 g/dL   GFR, Estimated >29 >56 mL/min    Comment: (NOTE) Calculated using the CKD-EPI Creatinine Equation (2021)    Anion gap 6 5 - 15    Comment: Performed at [facility], [address], [place], [place] [address]  Magnesium     Status: None   Collection Time: 11/16/22  8:22 AM  Result Value Ref Range   Magnesium 2.0 1.7 - 2.4 mg/dL    Comment: Performed at [facility], [address] [place], [place] [address]  Procalcitonin     Status: None   Collection Time: 11/16/22  8:22 AM  Result Value Ref Range   Procalcitonin <0.10 ng/mL    Comment:        Interpretation: PCT (Procalcitonin) <= 0.5 ng/mL: Systemic infection (sepsis) is not likely. Local bacterial infection is possible. (NOTE)       Sepsis PCT Algorithm           Lower Respiratory Tract                                      Infection PCT Algorithm    ----------------------------     ----------------------------         PCT < 0.25 ng/mL                PCT < 0.10 ng/mL          Strongly encourage             Strongly discourage   discontinuation of antibiotics    initiation of antibiotics    ----------------------------     -----------------------------       PCT 0.25 - 0.50 ng/mL            PCT 0.10 - 0.25 ng/mL               OR       >80% decrease in PCT            Discourage initiation of                                            antibiotics      Encourage discontinuation           of antibiotics    ----------------------------     -----------------------------         PCT >= 0.50 ng/mL              PCT 0.26 -  0.50 ng/mL               AND        <80% decrease in PCT             Encourage initiation of                                             antibiotics       Encourage continuation           of antibiotics    ----------------------------     -----------------------------        PCT >= 0.50 ng/mL                  PCT > 0.50 ng/mL               AND         increase in PCT                  Strongly encourage                                      initiation of antibiotics    Strongly encourage escalation           of antibiotics                                      -----------------------------                                           PCT <= 0.25 ng/mL                                                 OR                                        > 80% decrease in PCT                                      Discontinue / Do not initiate                                             antibiotics  Performed at [facility], [address] [place], [place] [address]   Glucose, capillary     Status: Abnormal   Collection Time: 11/16/22 11:47 AM  Result Value Ref Range   Glucose-Capillary 100 (H) 70 - 99 mg/dL    Comment: Glucose reference range applies only to samples taken after fasting for at least 8 hours.  Glucose, capillary     Status: None   Collection Time: 11/16/22  3:02 PM  Result Value  Ref Range   Glucose-Capillary 84 70 - 99 mg/dL    Comment: Glucose reference range applies only to samples taken after fasting for at least 8 hours.  Culture, Respiratory w Gram Stain     Status: None (Preliminary result)   Collection Time: 11/16/22  3:53 PM   Specimen: Tracheal Aspirate; Respiratory  Result Value Ref Range   Specimen Description TRACHEAL ASPIRATE    Special Requests NONE    Gram Stain      RARE SQUAMOUS EPITHELIAL CELLS PRESENT WBC PRESENT,BOTH PMN AND MONONUCLEAR ABUNDANT GRAM POSITIVE COCCI Performed at [facility], [address] [place], [place] [address]    Culture PENDING    Report Status PENDING   Glucose, capillary     Status: None   Collection Time: 11/16/22  8:58 PM  Result Value Ref Range   Glucose-Capillary 86 70 - 99 mg/dL    Comment: Glucose reference range applies only to samples taken after fasting for at least 8 hours.  Glucose, capillary     Status: Abnormal   Collection Time: 11/17/22 12:18 AM  Result Value Ref Range   Glucose-Capillary 108 (H) 70 - 99 mg/dL    Comment: Glucose reference range applies only to samples taken after fasting for at least 8 hours.  Glucose, capillary     Status: Abnormal    Collection Time: 11/17/22  4:20 AM  Result Value Ref Range   Glucose-Capillary 124 (H) 70 - 99 mg/dL    Comment: Glucose reference range applies only to samples taken after fasting for at least 8 hours.  CBC     Status: Abnormal   Collection Time: 11/17/22  4:23 AM  Result Value Ref Range   WBC 13.3 (H) 4.0 - 10.5 K/uL   RBC 4.92 4.22 - 5.81 MIL/uL   Hemoglobin 14.4 13.0 - 17.0 g/dL   HCT 60.4 54.0 - 98.1 %   MCV 88.0 80.0 - 100.0 fL   MCH 29.3 26.0 - 34.0 pg   MCHC 33.3 30.0 - 36.0 g/dL   RDW 19.1 (H) 47.8 - 29.5 %   Platelets 116 (L) 150 - 400 K/uL   nRBC 0.0 0.0 - 0.2 %    Comment: Performed at [facility], [address] [place], [place] [address]  Renal function panel     Status: Abnormal   Collection Time: 11/17/22  4:23 AM  Result Value Ref Range   Sodium 139 135 - 145 mmol/L   Potassium 4.1 3.5 - 5.1 mmol/L   Chloride 108 98 - 111 mmol/L   CO2 21 (L) 22 - 32 mmol/L   Glucose, Bld 120 (H) 70 - 99 mg/dL    Comment: Glucose reference range applies only to samples taken after fasting for at least 8 hours.   BUN 12 6 - 20 mg/dL   Creatinine, Ser 8.65 0.61 - 1.24 mg/dL   Calcium 8.8 (L) 8.9 - 10.3 mg/dL   Phosphorus 3.2 2.5 - 4.6 mg/dL   Albumin 3.3 (L) 3.5 - 5.0 g/dL   GFR, Estimated >78 >46 mL/min    Comment: (NOTE) Calculated using the CKD-EPI Creatinine Equation (2021)    Anion gap 10 5 - 15    Comment: Performed at [facility], [address] [place], [place] [address]  Glucose, capillary     Status: Abnormal   Collection Time: 11/17/22  7:49 AM  Result Value Ref Range   Glucose-Capillary 126 (H) 70 - 99 mg/dL  Comment: Glucose reference range applies only to samples taken after fasting for at least 8 hours.    Recent Results (from the past 240 hour(s))  MRSA Next Gen by PCR, Nasal     Status: None   Collection Time: 11/15/22  4:02 PM   Specimen: Nasal Mucosa; Nasal Swab  Result Value Ref Range Status   MRSA by PCR Next Gen NOT DETECTED NOT  DETECTED Final    Comment: (NOTE) The GeneXpert MRSA Assay (FDA approved for NASAL specimens only), is one component of a comprehensive MRSA colonization surveillance program. It is not intended to diagnose MRSA infection nor to guide or monitor treatment for MRSA infections. Test performance is not FDA approved in patients less than 61 years old. Performed at [facility], [address], [place], [place] [address]   Culture, Respiratory w Gram Stain     Status: None (Preliminary result)   Collection Time: 11/16/22  3:53 PM   Specimen: Tracheal Aspirate; Respiratory  Result Value Ref Range Status   Specimen Description TRACHEAL ASPIRATE  Final   Special Requests NONE  Final   Gram Stain   Final    RARE SQUAMOUS EPITHELIAL CELLS PRESENT WBC PRESENT,BOTH PMN AND MONONUCLEAR ABUNDANT GRAM POSITIVE COCCI Performed at [facility], [address] [place], [place] [address]    Culture PENDING  Incomplete   Report Status PENDING  Incomplete    Lipid Panel Recent Labs    11/16/22 8295  TRIG 78    Studies/Results: ECHOCARDIOGRAM COMPLETE  Result Date: 11/16/2022    ECHOCARDIOGRAM REPORT   Patient Name:   MALLOY COLLETTA Date of Exam: 11/16/2022 Medical Rec #:  [id number]     Height:       71.0 in Accession #:    [id number]    Weight:       173.1 lb Date of Birth:  1975-12-10     BSA:          1.983 m Patient Age:    47 years      BP:           110/81 mmHg Patient Gender: M             HR:           63 bpm. Exam Location:  Inpatient Procedure: 2D Echo Indications:    Hypotension  History:        Patient has prior history of Echocardiogram examinations, most                 recent 12/25/2020. Signs/Symptoms:seizures; Risk Factors:Current                 Smoker.  Sonographer:    Delcie Roch RDCS Referring Phys: [phone] PAULA B SIMPSON  Sonographer Comments: Echo performed with patient supine and on artificial respirator. Image acquisition challenging due to respiratory motion.  IMPRESSIONS  1. Left ventricular ejection fraction, by estimation, is 45%. The left ventricle has mildly decreased function. The left ventricle has no regional wall motion abnormalities. Left ventricular diastolic parameters were normal.  2. Right ventricular systolic function is normal. The right ventricular size is normal.  3. Small to moderate circumferential pericardial effusion is present.  4. The mitral valve is normal in structure. Trivial mitral valve regurgitation. No evidence of mitral stenosis.  5. The aortic valve was not well visualized. Aortic valve regurgitation is not visualized. No aortic stenosis is present. Comparison(s): Changes from prior study are noted. LVEF worsened  from 50-55% to 45% now. FINDINGS  Left Ventricle: Left ventricular ejection fraction, by estimation, is 45%. The left ventricle has mildly decreased function. The left ventricle has no regional wall motion abnormalities. The left ventricular internal cavity size was normal in size. There is no left ventricular hypertrophy. Left ventricular diastolic parameters were normal. Right Ventricle: The right ventricular size is normal. No increase in right ventricular wall thickness. Right ventricular systolic function is normal. Left Atrium: Left atrial size was normal in size. Right Atrium: Right atrial size was normal in size. Pericardium: Small to moderate circumferential pericardial effusion is present. Mitral Valve: The mitral valve is normal in structure. Trivial mitral valve regurgitation. No evidence of mitral valve stenosis. Tricuspid Valve: The tricuspid valve is normal in structure. Tricuspid valve regurgitation is trivial. No evidence of tricuspid stenosis. Aortic Valve: The aortic valve was not well visualized. Aortic valve regurgitation is not visualized. No aortic stenosis is present. Pulmonic Valve: The pulmonic valve was not well visualized. Pulmonic valve regurgitation is not visualized. No evidence of pulmonic stenosis.  Aorta: The aortic root is normal in size and structure. Venous: IVC assessment for right atrial pressure unable to be performed due to mechanical ventilation. IAS/Shunts: No atrial level shunt detected by color flow Doppler.  LEFT VENTRICLE PLAX 2D LVIDd:         4.70 cm     Diastology LVIDs:         3.30 cm     LV e' medial:    9.03 cm/s LV PW:         1.10 cm     LV E/e' medial:  4.9 LV IVS:        1.00 cm     LV e' lateral:   9.90 cm/s LVOT diam:     2.30 cm     LV E/e' lateral: 4.4 LVOT Area:     4.15 cm  LV Volumes (MOD) LV vol d, MOD A4C: 95.1 ml LV vol s, MOD A4C: 43.2 ml LV SV MOD A4C:     95.1 ml RIGHT VENTRICLE            IVC RV Basal diam:  3.30 cm    IVC diam: 2.10 cm RV S prime:     9.57 cm/s TAPSE (M-mode): 1.8 cm LEFT ATRIUM           Index        RIGHT ATRIUM           Index LA diam:      2.70 cm 1.36 cm/m   RA Area:     13.90 cm LA Vol (A4C): 30.9 ml 15.58 ml/m  RA Volume:   37.40 ml  18.86 ml/m   AORTA Ao Root diam: 3.20 cm MITRAL VALVE MV Area (PHT): 3.65 cm    SHUNTS MV Decel Time: 208 msec    Systemic Diam: 2.30 cm MV E velocity: 44.00 cm/s MV A velocity: 39.20 cm/s MV E/A ratio:  1.12 Vishnu Priya Mallipeddi Electronically signed by Winfield Rast Mallipeddi Signature Date/Time: 11/16/2022/3:05:52 PM    Final    Overnight EEG with video  Result Date: 11/16/2022 Charlsie Quest, MD     11/17/2022  8:04 AM Patient Name: VRISHANK NEPOMUCENO MRN: [id number] Epilepsy Attending: Charlsie Quest Referring Physician/Provider: Caryl Pina, MD Duration: 11/15/2022 1926 to 11/16/2022 1926 Patient history:  47 y.o. male with a past medical history of depression, bipolar disorder, schizophrenia, and seizures who initially presented with recurrent seizures.  EEG to evaluate for seizure. Level of alertness:  comatose AEDs during EEG study: LEV, Propofol, versed Technical aspects: This EEG study was done with scalp electrodes positioned according to the 10-20 International system of electrode placement.  Electrical activity was reviewed with band pass filter of 1-70Hz , sensitivity of 7 uV/mm, display speed of 74mm/sec with a 60Hz  notched filter applied as appropriate. EEG data were recorded continuously and digitally stored.  Video monitoring was available and reviewed as appropriate. Description: EEG showed continuous generalized and lateralized right hemisphere 3-7hz  theta-delta slowing admixed with 13-15Hz  fronto-central beta activity as well. Hyperventilation and photic stimulation were not performed.   ABNORMALITY - Continuous slow, generalized  and lateralized right hemisphere IMPRESSION: This study is suggestive of cortical dysfunction arising from right hemisphere likely secondary to underlying structural abnormality, post-ictal state. Additionally there is severe diffuse encephalopathy likely related to sedation. No seizures or epileptiform discharges were seen throughout the recording. Charlsie Quest   DG CHEST PORT 1 VIEW  Result Date: 11/15/2022 CLINICAL DATA:  ETT adjustment EXAM: PORTABLE CHEST 1 VIEW COMPARISON:  11/15/2022, 03/09/2022 FINDINGS: Endotracheal tube tip about 4.9 cm superior to carina. Esophageal tube tip below the diaphragm but incompletely visualized. Clear lung fields. Stable cardiomediastinal silhouette IMPRESSION: Endotracheal tube tip about 4.9 cm superior to carina. Electronically Signed   By: Jasmine Pang M.D.   On: 11/15/2022 23:06   DG Abd Portable 1V  Result Date: 11/15/2022 CLINICAL DATA:  NG tube placement EXAM: PORTABLE ABDOMEN - 1 VIEW COMPARISON:  02/12/2021 FINDINGS: Tip and side port in the NG tube are noted within the fundus of the stomach. Bowel gas pattern is nonspecific. Pelvis is not included in its entirety. IMPRESSION: Tip of NG tube is seen in the fundus of the stomach. Electronically Signed   By: Ernie Avena M.D.   On: 11/15/2022 18:15   DG Chest Port 1 View  Result Date: 11/15/2022 CLINICAL DATA:  Endotracheal tube placement EXAM:  PORTABLE CHEST 1 VIEW COMPARISON:  Previous studies including the examination done earlier today FINDINGS: Tip of endotracheal tube is 6 cm above the carina. Tip of NG tube is seen in the stomach. Transverse diameter of heart is increased. There are no signs of pulmonary edema or focal pulmonary consolidation. Apices of both lungs are not included in the radiographs. There is no pleural effusion or pneumothorax. IMPRESSION: There are no new infiltrates or signs of pulmonary edema. Tip of NG tube is seen in the fundus of the stomach. Electronically Signed   By: Ernie Avena M.D.   On: 11/15/2022 18:14   CT Head Wo Contrast  Result Date: 11/15/2022 CLINICAL DATA:  Seizure disorder, clinical change EXAM: CT HEAD WITHOUT CONTRAST TECHNIQUE: Contiguous axial images were obtained from the base of the skull through the vertex without intravenous contrast. RADIATION DOSE REDUCTION: This exam was performed according to the departmental dose-optimization program which includes automated exposure control, adjustment of the mA and/or kV according to patient size and/or use of iterative reconstruction technique. COMPARISON:  CT head 03/09/22 FINDINGS: Brain: No evidence of acute infarction, hemorrhage, hydrocephalus, extra-axial collection or mass lesion/mass effect. Vascular: No hyperdense vessel or unexpected calcification. Skull: Normal. Negative for fracture or focal lesion. Sinuses/Orbits: No middle ear or mastoid effusion. Paranasal sinuses are clear. Orbits are unremarkable. Other: None. IMPRESSION: No acute intracranial abnormality. Electronically Signed   By: Lorenza Cambridge M.D.   On: 11/15/2022 13:27   DG Chest Portable 1 View  Result Date: 11/15/2022 CLINICAL DATA:  s/p intubation EXAM: PORTABLE CHEST 1 VIEW COMPARISON:  CXR 11/15/22 FINDINGS: Endotracheal tube terminates approximately 5 cm above carina. No pleural effusion. No pneumothorax. Unchanged cardiac and mediastinal contours. There are prominent  bilateral interstitial opacities that could represent atypical infection or pulmonary venous congestion. No radiographically apparent displaced rib fracture. Visualized upper abdomen is unremarkable. IMPRESSION: Endotracheal tube terminates approximately 5 cm above carina. Electronically Signed   By: Lorenza Cambridge M.D.   On: 11/15/2022 12:10   DG Chest Portable 1 View  Result Date: 11/15/2022 CLINICAL DATA:  AMS EXAM: PORTABLE CHEST 1 VIEW COMPARISON:  CXR 03/09/22 FINDINGS: No pleural effusion. No pneumothorax. Normal cardiac and mediastinal contours. No focal airspace opacity. No radiographically apparent displaced rib fracture. Visualized upper abdomen is unremarkable. IMPRESSION: No focal airspace opacity Electronically Signed   By: Lorenza Cambridge M.D.   On: 11/15/2022 11:22    Medications: Scheduled:  Chlorhexidine Gluconate Cloth  6 each Topical QHS   famotidine  20 mg Per Tube BID   folic acid  1 mg Per Tube Daily   heparin  5,000 Units Subcutaneous Q8H   insulin aspart  0-6 Units Subcutaneous Q4H   mouth rinse  15 mL Mouth Rinse Q2H   thiamine  100 mg Per Tube Daily   Continuous:  sodium chloride 10 mL/hr at 11/17/22 0700   dexmedetomidine (PRECEDEX) IV infusion 1.2 mcg/kg/hr (11/17/22 0800)   feeding supplement (VITAL AF 1.2 CAL) 20 mL/hr at 11/17/22 0800   levETIRAcetam Stopped (11/17/22 4098)   norepinephrine (LEVOPHED) Adult infusion Stopped (11/16/22 1901)   valproate sodium Stopped (11/16/22 2256)    Assessment: 47 y.o. male with a pertinent medical history of depression, bipolar disorder, schizophrenia, and seizures who initially presented to [facility] on 7/12 with recurrent seizures and was subsequently intubated. He is currently on Keppra and Depakote. He was loaded with Vimpat 200mg  and Keppra 60mg /kg at [facility] prior to transfer. Vimpat was not continued as a scheduled dose due to prior side effect of dizziness. He was continued on Keppra and Depakote scheduled dosing. EtOH level was  < 10 at [facility]. Not currently using alcohol. History of cocaine and marijuana use. UDS was positive for THC but negative for cocaine. - Exam today post-extubation: neurologically intact, no focal deficits, but with formed visual hallucinations that he is aware are not real.  Speech is at baseline. His significant other is at the bedside and states he seems back to his normal self.  - AED levels:  - Keppra level was therapeutic at 24.9 on Friday - VPA level 50 on Friday - EEGs: - LTM EEG report for today: Sharp waves, right anterior temporal region; Continuous slow, generalized and lateralized to the right hemisphere. This study showed evidence of epileptogenicity arising from the right anterior temporal region. Additionally, there is evidence of cortical dysfunction arising from the right hemisphere, likely secondary to underlying structural abnormality and/or post-ictal state. Lastly, there is severe diffuse encephalopathy likely related to sedation. No seizures were seen throughout the recording. - LTM EEG report (7/13): Continuous slow, generalized and lateralized right hemisphere. This study is suggestive of cortical dysfunction arising from right hemisphere likely secondary to underlying structural abnormality, post-ictal state. Additionally there is severe diffuse encephalopathy, likely related to sedation. No seizures or epileptiform discharges were seen throughout the recording.     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 Caryl Pina 11/17/2022  10:44 AM

## 2022-11-17 NOTE — Plan of Care (Signed)
  Problem: Safety: Goal: Non-violent Restraint(s) Outcome: Completed/Met   

## 2022-11-17 NOTE — Procedures (Addendum)
Patient Name: Luis Hunter  MRN: [id number]  Epilepsy Attending: Charlsie Quest  Referring Physician/Provider: Caryl Pina, MD  Duration: 11/16/2022 1926 to 11/17/2022 1005   Patient history:  47 y.o. male with a past medical history of depression, bipolar disorder, schizophrenia, and seizures who initially presented with recurrent seizures. EEG to evaluate for seizure.   Level of alertness:  comatose   AEDs during EEG study: LEV, VPA   Technical aspects: This EEG study was done with scalp electrodes positioned according to the 10-20 International system of electrode placement. Electrical activity was reviewed with band pass filter of 1-70Hz , sensitivity of 7 uV/mm, display speed of 8mm/sec with a 60Hz  notched filter applied as appropriate. EEG data were recorded continuously and digitally stored.  Video monitoring was available and reviewed as appropriate.   Description: EEG showed continuous generalized and lateralized right hemisphere 3-7hz  theta-delta slowing admixed with 13-15Hz  fronto-central beta activity as well. Sharp waves were noted in right anterior temporal region. Hyperventilation and photic stimulation were not performed.      ABNORMALITY - Sharp waves were, right anterior temporal region - Continuous slow, generalized  and lateralized right hemisphere    IMPRESSION: This study showed evidence epileptogenicity arising from right anterior temporal region. Additionally there is of cortical dysfunction arising from right hemisphere likely secondary to underlying structural abnormality, post-ictal state. Lastly there is severe diffuse encephalopathy likely related to sedation. No seizures were seen throughout the recording.   Luis Hunter

## 2022-11-17 NOTE — Progress Notes (Signed)
LTM EEG discontinued - no skin breakdown at unhook.   

## 2022-11-17 NOTE — Procedures (Signed)
Extubation Procedure Note  Patient Details:   Name: Luis Hunter DOB: 1976-04-19 MRN: [id number]   Airway Documentation:    Vent end date: 11/17/22 Vent end time: 1014   Evaluation  O2 sats: stable throughout Complications: No apparent complications Patient did tolerate procedure well. Bilateral Breath Sounds: Clear, Diminished   Yes  Pt extubated per order to 4L [place]. Pt had a positive cuff leak, able to state name, good cough and no stridor noted.   Kerri Perches 11/17/2022, 10:15 AM

## 2022-11-17 NOTE — Progress Notes (Signed)
[facility]) updated about pt having 30 cc/hr urine output this shift. Pt has no true fluid intake onboard. This information relayed to [facility] and will be relayed to the on-coming RN.

## 2022-11-17 NOTE — Progress Notes (Signed)
eLink Physician-Brief Progress Note Patient Name: Luis Hunter DOB: 30-Jan-1976 MRN: [id number]   Date of Service  11/17/2022  HPI/Events of Note  Notified of hypertension with BP 164/118, HR 61, RR 20, O2 sats 99%. Pt is intubated and sedated on precedex gtt.  He appears comfortable.   eICU Interventions  Give hydralazine IV PRN.     Intervention Category Intermediate Interventions: Hypertension - evaluation and management  Larinda Buttery 11/17/2022, 3:37 AM

## 2022-11-17 NOTE — H&P (Signed)
NAME:  Luis Hunter, MRN:  [id number], DOB:  03/03/1976, LOS: 2 ADMISSION DATE:  11/15/2022, CONSULTATION DATE:  11/15/22 REFERRING MD:  [facility], CHIEF COMPLAINT:  seizures   History of Present Illness:  HPI obtained from medical chart review as patient is intubated and sedated on mechanical ventilation.   54 yoM with PMH significant for seizures, polysubstance and tobacco, schizophrenia, depression who presented to [facility] after multiple witnessed seizures.  In ER, ongoing multiple seizures, intubated for airway protection.  CTH neg for acute process.  Loaded with keppra and vimpat, sedated on propofol.  Neurology consulted. Pt recently taken off vimpat in June due to balance and dizziness.  Patient to be transferred to [facility] for LTM.  PCCM to admit.   Pertinent  Medical History   Past Medical History:  Diagnosis Date   Bipolar disorder (HCC)    Depression    Schizophrenia (HCC)    Seizures (HCC)   Tobacco abuse, polysubstance abuse   Significant Hospital Events: Including procedures, antibiotic start and stop dates in addition to other pertinent events   7/12 [facility] ER> cone for SE, intubated 7/13 continuous EEG shows no seizures.  Interim History / Subjective:  No further seizures since arrival. Tolerated sedation weaning.  Objective   Blood pressure (!) 138/95, pulse 83, temperature (!) 97.4 F (36.3 C), temperature source Axillary, resp. rate (!) 25, height 5\' 11"  (1.803 m), weight 78.9 kg, SpO2 97%.    Vent Mode: CPAP;PSV FiO2 (%):  [40 %] 40 % Set Rate:  [18 bmp] 18 bmp Vt Set:  [600 mL] 600 mL PEEP:  [5 cmH20] 5 cmH20 Pressure Support:  [10 cmH20] 10 cmH20 Plateau Pressure:  [15 cmH20-17 cmH20] 17 cmH20   Intake/Output Summary (Last 24 hours) at 11/17/2022 1230 Last data filed at 11/17/2022 0800 Gross per 24 hour  Intake 1279.45 ml  Output 350 ml  Net 929.45 ml   Filed Weights   11/15/22 1600 11/16/22 0545 11/17/22 0345  Weight: 76 kg 78.5 kg 78.9 kg    Examination: General:  critically ill adult male sedated on MV HEENT: MM pink/moist, ETT pupils 3/sluggish, anicteric  Neuro: Awake and following commands CV: rr, NSR, no obvious murmur PULM:  MV supported, clear, tolerating SBT GI: soft, bs+, ND, foley- cyu Extremities: warm/dry, no LE edema  Skin: no rashes, tattoos  Ancillary tests personally reviewed  Continuous EEG shows no seizures.  + focus of irritability.  Normal electrolytes. Mild leukocytosis 13.3 Mild thrombocytopenia 116.  Etiology unclear. Mildly reduced EF on echo Assessment & Plan:   Status epilepticus Hx seizures Hx polysubstance abuse  Demand related ischemia Possible cardiomyopathy Schizophrenia, depression, bipolar  Plan:  -Extubate today. -Continue levetiracetam and valproate.  Hold Vimpat as patient had disequilibrium from it. - Progressive ambulation.  - Cardiology consultation for possible cardiomyopathy.   Best Practice (right click and "Reselect all SmartList Selections" daily)   Diet/type: NPO trickle tube feeds DVT prophylaxis: prophylactic heparin  GI prophylaxis: PPI Lines: N/A Foley:  Yes, and it is still needed Code Status:  full code Last date of multidisciplinary goals of care discussion [family updated 7/14]  CRITICAL CARE Performed by: Lynnell Catalan   Total critical care time: 40 minutes  Critical care time was exclusive of separately billable procedures and treating other patients.  Critical care was necessary to treat or prevent imminent or life-threatening deterioration.  Critical care was time spent personally by me on the following activities: development of treatment plan with patient and/or surrogate as  well as nursing, discussions with consultants, evaluation of patient's response to treatment, examination of patient, obtaining history from patient or surrogate, ordering and performing treatments and interventions, ordering and review of laboratory studies, ordering  and review of radiographic studies, pulse oximetry, re-evaluation of patient's condition and participation in multidisciplinary rounds.  Lynnell Catalan, MD [facility] ICU Physician [facility] Critical Care  Pager: [phone] Mobile: [phone] After hours: [phone].

## 2022-11-18 ENCOUNTER — Other Ambulatory Visit: Payer: Self-pay

## 2022-11-18 DIAGNOSIS — R569 Unspecified convulsions: Secondary | ICD-10-CM | POA: Diagnosis not present

## 2022-11-18 LAB — RENAL FUNCTION PANEL
Albumin: 3.2 g/dL — ABNORMAL LOW (ref 3.5–5.0)
Anion gap: 10 (ref 5–15)
BUN: 11 mg/dL (ref 6–20)
CO2: 22 mmol/L (ref 22–32)
Calcium: 8.5 mg/dL — ABNORMAL LOW (ref 8.9–10.3)
Chloride: 105 mmol/L (ref 98–111)
Creatinine, Ser: 0.86 mg/dL (ref 0.61–1.24)
GFR, Estimated: >60 mL/min (ref >60)
Glucose, Bld: 96 mg/dL (ref 70–99)
Phosphorus: 2.6 mg/dL (ref 2.5–4.6)
Potassium: 2.9 mmol/L — ABNORMAL LOW (ref 3.5–5.1)
Sodium: 137 mmol/L (ref 135–145)

## 2022-11-18 LAB — CBC
HCT: 39.4 % (ref 39.0–52.0)
Hemoglobin: 13.2 g/dL (ref 13.0–17.0)
MCH: 29.3 pg (ref 26.0–34.0)
MCHC: 33.5 g/dL (ref 30.0–36.0)
MCV: 87.6 fL (ref 80.0–100.0)
Platelets: 120 10*3/uL — ABNORMAL LOW (ref 150–400)
RBC: 4.5 MIL/uL (ref 4.22–5.81)
RDW: 15.9 % — ABNORMAL HIGH (ref 11.5–15.5)
WBC: 11.9 10*3/uL — ABNORMAL HIGH (ref 4.0–10.5)
nRBC: 0 % (ref 0.0–0.2)

## 2022-11-18 LAB — CULTURE, RESPIRATORY W GRAM STAIN

## 2022-11-18 LAB — GLUCOSE, CAPILLARY
Glucose-Capillary: 116 mg/dL — ABNORMAL HIGH (ref 70–99)
Glucose-Capillary: 92 mg/dL (ref 70–99)
Glucose-Capillary: 95 mg/dL (ref 70–99)
Glucose-Capillary: 99 mg/dL (ref 70–99)

## 2022-11-18 MED ORDER — DIVALPROEX SODIUM ER 500 MG PO TB24
500.0000 mg | ORAL_TABLET | Freq: Every day | ORAL | Status: DC
  Administered 2022-11-18 – 2022-11-19 (×2): 500 mg via ORAL
  Filled 2022-11-18 (×2): qty 1

## 2022-11-18 MED ORDER — ARIPIPRAZOLE ER 400 MG IM SRER: 400.0000 mg | INTRAMUSCULAR | Status: DC

## 2022-11-18 MED ORDER — POTASSIUM CHLORIDE CRYS ER 20 MEQ PO TBCR
40.0000 meq | EXTENDED_RELEASE_TABLET | Freq: Once | ORAL | Status: AC
  Administered 2022-11-18: 40 meq via ORAL
  Filled 2022-11-18: qty 2

## 2022-11-18 MED ORDER — LOSARTAN POTASSIUM 50 MG PO TABS
50.0000 mg | ORAL_TABLET | Freq: Every day | ORAL | Status: DC
  Administered 2022-11-19: 50 mg via ORAL
  Filled 2022-11-18: qty 1

## 2022-11-18 MED ORDER — POTASSIUM CHLORIDE 10 MEQ/100ML IV SOLN
INTRAVENOUS | Status: AC
  Filled 2022-11-18: qty 100

## 2022-11-18 MED ORDER — FOLIC ACID 1 MG PO TABS
1.0000 mg | ORAL_TABLET | Freq: Every day | ORAL | Status: DC
  Administered 2022-11-19: 1 mg via ORAL
  Filled 2022-11-18: qty 1

## 2022-11-18 MED ORDER — ALPRAZOLAM 0.25 MG PO TABS
0.2500 mg | ORAL_TABLET | Freq: Once | ORAL | Status: AC | PRN
  Administered 2022-11-18: 0.25 mg via ORAL
  Filled 2022-11-18: qty 1

## 2022-11-18 MED ORDER — THIAMINE MONONITRATE 100 MG PO TABS
100.0000 mg | ORAL_TABLET | Freq: Every day | ORAL | Status: DC
  Administered 2022-11-19: 100 mg via ORAL
  Filled 2022-11-18: qty 1

## 2022-11-18 MED ORDER — LEVETIRACETAM 500 MG PO TABS
1000.0000 mg | ORAL_TABLET | Freq: Two times a day (BID) | ORAL | Status: DC
  Administered 2022-11-18 – 2022-11-19 (×2): 1000 mg via ORAL
  Filled 2022-11-18 (×2): qty 2

## 2022-11-18 MED ORDER — POTASSIUM CHLORIDE 10 MEQ/100ML IV SOLN
10.0000 meq | INTRAVENOUS | Status: DC
  Administered 2022-11-18 (×4): 10 meq via INTRAVENOUS
  Filled 2022-11-18 (×3): qty 100

## 2022-11-18 MED ORDER — LOPERAMIDE HCL 2 MG PO CAPS: 2.0000 mg | ORAL_CAPSULE | ORAL | Status: DC | PRN

## 2022-11-18 NOTE — Progress Notes (Addendum)
   NAME:  Luis Hunter, MRN:  [id number], DOB:  November 02, 1975, LOS: 3 ADMISSION DATE:  11/15/2022, CONSULTATION DATE:  11/15/22 REFERRING MD:  [facility], CHIEF COMPLAINT:  seizures   History of Present Illness:  HPI obtained from medical chart review as patient is intubated and sedated on mechanical ventilation.   49 yoM with PMH significant for seizures, polysubstance and tobacco, schizophrenia, depression who presented to [facility] after multiple witnessed seizures.  In ER, ongoing multiple seizures, intubated for airway protection.  CTH neg for acute process.  Loaded with keppra and vimpat, sedated on propofol.  Neurology consulted. Pt recently taken off vimpat in June due to balance and dizziness.  Patient to be transferred to [facility] for LTM.  PCCM to admit.   Pertinent  Medical History   Past Medical History:  Diagnosis Date   Bipolar disorder (HCC)    Depression    Schizophrenia (HCC)    Seizures (HCC)   Tobacco abuse, polysubstance abuse   Significant Hospital Events: Including procedures, antibiotic start and stop dates in addition to other pertinent events   7/12 [facility] ER> cone for SE, intubated 7/13 continuous EEG shows no seizures. 7/14 Extubated.  Interim History / Subjective:  No further seizures since arrival. Tolerated sedation weaning.  Objective   Blood pressure (!) 157/95, pulse 88, temperature 98.6 F (37 C), temperature source Oral, resp. rate 15, height 5\' 11"  (1.803 m), weight 78.9 kg, SpO2 99%.        Intake/Output Summary (Last 24 hours) at 11/18/2022 1027 Last data filed at 11/18/2022 0700 Gross per 24 hour  Intake 1036.79 ml  Output 1675 ml  Net -638.21 ml   Filed Weights   11/15/22 1600 11/16/22 0545 11/17/22 0345  Weight: 76 kg 78.5 kg 78.9 kg   Examination: General:  critically ill adult male sedated on MV HEENT: MM pink/moist, ETT pupils 3/sluggish, anicteric  Neuro: Awake and following commands CV: rr, NSR, no obvious murmur PULM:  MV supported, clear,  tolerating SBT GI: soft, bs+, ND, foley- cyu Extremities: warm/dry, no LE edema  Skin: no rashes, tattoos  Ancillary tests personally reviewed  Continuous EEG shows no seizures.  + focus of irritability.  Normal electrolytes. Mild leukocytosis 13.3 Mild thrombocytopenia 116.  Etiology unclear. Mildly reduced EF on echo S aureus on tracheal aspirate Assessment & Plan:   Status epilepticus Hx seizures Hx polysubstance abuse  Demand related ischemia Possible cardiomyopathy Iatrogenic diarrhea Schizophrenia, depression, bipolar  Plan:  -Doing well post-extubation but still having balance issues.  May a few days of acute PT prior to safe discharge.  -Continue levetiracetam and valproate.  Hold Vimpat as patient had disequilibrium from it. - Progressive ambulation.  - Cardiology following for possible NICM. Will need follow-up at [facility] - Laxatives on hold and starting solid food.  - Patient is presently on room air with no respiratory symptoms. Will hold on treating sputum culture results.  Ready for transfer. TRH notified and orders reconciled.   Best Practice (right click and "Reselect all SmartList Selections" daily)   Diet/type: regular diet DVT prophylaxis: prophylactic heparin  GI prophylaxis: PPI Lines: N/A Foley:  Removed Code Status:  full code Last date of multidisciplinary goals of care discussion [family updated 7/14]   Lynnell Catalan, MD [facility] ICU Physician [facility] Critical Care  Pager: [phone] Mobile: [phone] After hours: [phone].

## 2022-11-18 NOTE — Evaluation (Signed)
Physical Therapy Evaluation Patient Details Name: Luis Hunter MRN: [id number] DOB: Jul 19, 1975 Today's Date: 11/18/2022  History of Present Illness  47 y.o. male presents to [facility] on 11/15/2022 after multiple witnessed seizures. Pt intubated in ER, extubated on 7/14. PMH includes bipolar disorder, depression, schizophrenia, seizures, polysubstance abuse.  Clinical Impression  Pt presents to PT with deficits in functional mobility, gait, balance, endurance. Pt is able to ambulate for household distances but demonstrates intermittent stability throughout ambulation, staggering laterally multiple times during session. Pt's balance quality appears to improve some with increased ambulation distances. Pt will benefit from frequent mobilization in an effort to improve gait quality and reduce falls risk. PT recommends use of rollator initially once at home to allow for improved stability.        Assistance Recommended at Discharge Intermittent Supervision/Assistance  If plan is discharge home, recommend the following:  Can travel by private vehicle  A little help with bathing/dressing/bathroom;Assistance with cooking/housework;Direct supervision/assist for financial management;Direct supervision/assist for medications management;Assist for transportation;Help with stairs or ramp for entrance        Equipment Recommendations None recommended by PT  Recommendations for Other Services       Functional Status Assessment Patient has had a recent decline in their functional status and demonstrates the ability to make significant improvements in function in a reasonable and predictable amount of time.     Precautions / Restrictions Precautions Precautions: Fall Precaution Comments: rectal tube, foley, posey belt Restrictions Weight Bearing Restrictions: No      Mobility  Bed Mobility Overal bed mobility: Modified Independent                  Transfers Overall transfer level:  Needs assistance Equipment used: None Transfers: Sit to/from Stand Sit to Stand: Supervision                Ambulation/Gait Ambulation/Gait assistance: Min guard Gait Distance (Feet): 500 Feet Assistive device: None Gait Pattern/deviations: Step-through pattern, Staggering left, Drifts right/left Gait velocity: reduced Gait velocity interpretation: <1.8 ft/sec, indicate of risk for recurrent falls   General Gait Details: pt with 3 lateral losses of balance during session which pt corrects with stepping strategy and once with UE support. Balance quality appears to improve some with increased ambulation distance. Pt is unable to significantly increase gait speed when cues to ambulate faster  Stairs            Wheelchair Mobility     Tilt Bed    Modified Rankin (Stroke Patients Only)       Balance Overall balance assessment: Needs assistance Sitting-balance support: No upper extremity supported, Feet supported Sitting balance-Leahy Scale: Good     Standing balance support: No upper extremity supported, During functional activity Standing balance-Leahy Scale: Poor Standing balance comment: minG-minA                             Pertinent Vitals/Pain Pain Assessment Pain Assessment: No/denies pain    Home Living Family/patient expects to be discharged to:: Private residence Living Arrangements: Spouse/significant other Available Help at Discharge: Family;Available 24 hours/day Type of Home: Apartment Home Access: Stairs to enter Entrance Stairs-Rails: None Entrance Stairs-Number of Steps: 4   Home Layout: One level Home Equipment: Grab bars - toilet;Grab bars - tub/shower;Rollator (4 wheels)      Prior Function Prior Level of Function : Independent/Modified Independent  Mobility Comments: ambulatory without DME ADLs Comments: assist for transport and IADLs     Hand Dominance        Extremity/Trunk Assessment   Upper  Extremity Assessment Upper Extremity Assessment: Overall WFL for tasks assessed (history of tremors per significant other)    Lower Extremity Assessment Lower Extremity Assessment: Generalized weakness    Cervical / Trunk Assessment Cervical / Trunk Assessment: Normal  Communication   Communication:  (dysarthria)  Cognition Arousal/Alertness: Awake/alert Behavior During Therapy: WFL for tasks assessed/performed Overall Cognitive Status: History of cognitive impairments - at baseline                                 General Comments: pt with slowed processing, impairment in short term memory, fair awareness of deficits and safety        General Comments General comments (skin integrity, edema, etc.): VSS on RA    Exercises     Assessment/Plan    PT Assessment Patient needs continued PT services  PT Problem List Decreased strength;Decreased activity tolerance;Decreased balance;Decreased mobility;Decreased knowledge of use of DME;Decreased safety awareness       PT Treatment Interventions DME instruction;Gait training;Functional mobility training;Stair training;Therapeutic activities;Balance training;Neuromuscular re-education;Patient/family education    PT Goals (Current goals can be found in the Care Plan section)  Acute Rehab PT Goals Patient Stated Goal: to return home PT Goal Formulation: With patient Time For Goal Achievement: 12/02/22 Potential to Achieve Goals: Good Additional Goals Additional Goal #1: Pt will score >19/24 on the DGI to indicate a reduced risk for falls Additional Goal #2: Pt will score >45/56 on the BERG to indicate a reduced risk for falls    Frequency Min 3X/week     Co-evaluation               AM-PAC PT "6 Clicks" Mobility  Outcome Measure Help needed turning from your back to your side while in a flat bed without using bedrails?: None Help needed moving from lying on your back to sitting on the side of a flat bed  without using bedrails?: None Help needed moving to and from a bed to a chair (including a wheelchair)?: A Little Help needed standing up from a chair using your arms (e.g., wheelchair or bedside chair)?: A Little Help needed to walk in hospital room?: A Little Help needed climbing 3-5 steps with a railing? : A Little 6 Click Score: 20    End of Session Equipment Utilized During Treatment: Gait belt Activity Tolerance: Patient tolerated treatment well Patient left: in chair;with call bell/phone within reach;with chair alarm set;with family/visitor present Nurse Communication: Mobility status PT Visit Diagnosis: Other abnormalities of gait and mobility (R26.89);Other symptoms and signs involving the nervous system (R29.898)    Time: 1610-9604 PT Time Calculation (min) (ACUTE ONLY): 33 min   Charges:   PT Evaluation $PT Eval Low Complexity: 1 Low   PT General Charges $$ ACUTE PT VISIT: 1 Visit         Arlyss Gandy, PT, DPT Acute Rehabilitation Office [phone]   Arlyss Gandy 11/18/2022, 9:13 AM

## 2022-11-18 NOTE — Progress Notes (Signed)
[facility] ADULT ICU REPLACEMENT PROTOCOL   The patient does apply for the [facility] Adult ICU Electrolyte Replacment Protocol based on the criteria listed below:   1.Exclusion criteria: TCTS, ECMO, Dialysis, and Myasthenia Gravis patients 2. Is GFR >/= 30 ml/min? Yes.    Patient's GFR today is >60 3. Is SCr </= 2? Yes.   Patient's SCr is 0.86 mg/dL 4. Did SCr increase >/= 0.5 in 24 hours? No. 5.Pt's weight >40kg  Yes.   6. Abnormal electrolyte(s): K+2.9  7. Electrolytes replaced per protocol 8.  Call MD STAT for K+ </= 2.5, Phos </= 1, or Mag </= 1 Physician:  Dr. Duncan Dull, Lilia Argue 11/18/2022 6:10 AM

## 2022-11-18 NOTE — Plan of Care (Signed)

## 2022-11-18 NOTE — Progress Notes (Addendum)
Subjective: No hallucinations. Had breakfast this AM. Sitting in a recliner, significant other sitting in a chair next to him.  Objective: Current vital signs: BP (!) 157/95   Pulse 88   Temp 98.6 F (37 C) (Oral)   Resp 15   Ht 5\' 11"  (1.803 m)   Wt 78.9 kg   SpO2 99%   BMI 24.26 kg/m  Vital signs in last 24 hours: Temp:  [98.3 F (36.8 C)-99.3 F (37.4 C)] 98.6 F (37 C) (07/15 0710) Pulse Rate:  [80-120] 88 (07/15 0700) Resp:  [9-24] 15 (07/15 0700) BP: (113-167)/(73-114) 157/95 (07/15 0710) SpO2:  [79 %-100 %] 99 % (07/15 0700)  Intake/Output from previous day: 07/14 0701 - 07/15 0700 In: 1080.3 [P.O.:360; I.V.:255.2; NG/GT:220; IV Piggyback:245.1] Out: 1675 [Urine:1675] Intake/Output this shift: No intake/output data recorded. Nutritional status:  Diet Order             Diet regular Fluid consistency: Thin  Diet effective now                   Neurologic Exam:  Physical Exam  Constitutional: Appears well-developed and well-nourished.   Cardiovascular: Normal rate and regular rhythm.  Respiratory: Effort normal, non-labored breathing  Neuro: Mental Status: Patient is awake, alert, oriented to person, place, year, and situation, month and the day. He is able able to give a coherent medical history.  Speech is at baseline- dysarthric Cranial Nerves: II: Visual Fields are full. Pupils are equal, round, and reactive to light.   III,IV, VI: EOMI without ptosis or diploplia.  V: Facial sensation is symmetric to temperature VII: Facial movement is slightly asymmetric- baseline per significant other VIII: Hearing is intact to voice X: Palate elevates symmetrically XI: Shoulder shrug is symmetric. XII: Tongue protrudes midline without atrophy or fasciculations.  Motor: Tone is normal. Bulk is normal. 5/5 strength was present in all four extremities.  Sensory: Sensation is symmetric to light touch and temperature in the arms and legs. No extinction to DSS  present.  Cerebellar: FNF and HKS are intact bilaterally   Lab Results: Results for orders placed or performed during the hospital encounter of 11/15/22 (from the past 48 hour(s))  Glucose, capillary     Status: None   Collection Time: 11/16/22  3:02 PM  Result Value Ref Range   Glucose-Capillary 84 70 - 99 mg/dL    Comment: Glucose reference range applies only to samples taken after fasting for at least 8 hours.  Culture, Respiratory w Gram Stain     Status: None (Preliminary result)   Collection Time: 11/16/22  3:53 PM   Specimen: Tracheal Aspirate; Respiratory  Result Value Ref Range   Specimen Description TRACHEAL ASPIRATE    Special Requests NONE    Gram Stain      RARE SQUAMOUS EPITHELIAL CELLS PRESENT WBC PRESENT,BOTH PMN AND MONONUCLEAR ABUNDANT GRAM POSITIVE COCCI    Culture      ABUNDANT STAPHYLOCOCCUS AUREUS SUSCEPTIBILITIES TO FOLLOW Performed at [facility], [address] [place], [place] [address]    Report Status PENDING   Glucose, capillary     Status: None   Collection Time: 11/16/22  8:58 PM  Result Value Ref Range   Glucose-Capillary 86 70 - 99 mg/dL    Comment: Glucose reference range applies only to samples taken after fasting for at least 8 hours.  Glucose, capillary     Status: Abnormal   Collection Time: 11/17/22 12:18 AM  Result Value Ref Range  Glucose-Capillary 108 (H) 70 - 99 mg/dL    Comment: Glucose reference range applies only to samples taken after fasting for at least 8 hours.  Glucose, capillary     Status: Abnormal   Collection Time: 11/17/22  4:20 AM  Result Value Ref Range   Glucose-Capillary 124 (H) 70 - 99 mg/dL    Comment: Glucose reference range applies only to samples taken after fasting for at least 8 hours.  CBC     Status: Abnormal   Collection Time: 11/17/22  4:23 AM  Result Value Ref Range   WBC 13.3 (H) 4.0 - 10.5 K/uL   RBC 4.92 4.22 - 5.81 MIL/uL   Hemoglobin 14.4 13.0 - 17.0 g/dL   HCT 16.1 09.6 - 04.5 %    MCV 88.0 80.0 - 100.0 fL   MCH 29.3 26.0 - 34.0 pg   MCHC 33.3 30.0 - 36.0 g/dL   RDW 40.9 (H) 81.1 - 91.4 %   Platelets 116 (L) 150 - 400 K/uL   nRBC 0.0 0.0 - 0.2 %    Comment: Performed at [facility], [address], [place], [place] [address]  Renal function panel     Status: Abnormal   Collection Time: 11/17/22  4:23 AM  Result Value Ref Range   Sodium 139 135 - 145 mmol/L   Potassium 4.1 3.5 - 5.1 mmol/L   Chloride 108 98 - 111 mmol/L   CO2 21 (L) 22 - 32 mmol/L   Glucose, Bld 120 (H) 70 - 99 mg/dL    Comment: Glucose reference range applies only to samples taken after fasting for at least 8 hours.   BUN 12 6 - 20 mg/dL   Creatinine, Ser 6.21 0.61 - 1.24 mg/dL   Calcium 8.8 (L) 8.9 - 10.3 mg/dL   Phosphorus 3.2 2.5 - 4.6 mg/dL   Albumin 3.3 (L) 3.5 - 5.0 g/dL   GFR, Estimated >30 >86 mL/min    Comment: (NOTE) Calculated using the CKD-EPI Creatinine Equation (2021)    Anion gap 10 5 - 15    Comment: Performed at [facility], [address] [place], [place] [address]  Glucose, capillary     Status: Abnormal   Collection Time: 11/17/22  7:49 AM  Result Value Ref Range   Glucose-Capillary 126 (H) 70 - 99 mg/dL    Comment: Glucose reference range applies only to samples taken after fasting for at least 8 hours.  Glucose, capillary     Status: Abnormal   Collection Time: 11/17/22 11:42 AM  Result Value Ref Range   Glucose-Capillary 110 (H) 70 - 99 mg/dL    Comment: Glucose reference range applies only to samples taken after fasting for at least 8 hours.  Glucose, capillary     Status: Abnormal   Collection Time: 11/17/22  3:45 PM  Result Value Ref Range   Glucose-Capillary 108 (H) 70 - 99 mg/dL    Comment: Glucose reference range applies only to samples taken after fasting for at least 8 hours.  Glucose, capillary     Status: Abnormal   Collection Time: 11/17/22  8:37 PM  Result Value Ref Range   Glucose-Capillary 129 (H) 70 - 99 mg/dL    Comment: Glucose  reference range applies only to samples taken after fasting for at least 8 hours.  Glucose, capillary     Status: Abnormal   Collection Time: 11/18/22 12:37 AM  Result Value Ref Range   Glucose-Capillary 116 (H) 70 - 99  mg/dL    Comment: Glucose reference range applies only to samples taken after fasting for at least 8 hours.  Glucose, capillary     Status: None   Collection Time: 11/18/22  3:56 AM  Result Value Ref Range   Glucose-Capillary 95 70 - 99 mg/dL    Comment: Glucose reference range applies only to samples taken after fasting for at least 8 hours.  CBC     Status: Abnormal   Collection Time: 11/18/22  4:53 AM  Result Value Ref Range   WBC 11.9 (H) 4.0 - 10.5 K/uL   RBC 4.50 4.22 - 5.81 MIL/uL   Hemoglobin 13.2 13.0 - 17.0 g/dL   HCT 40.9 81.1 - 91.4 %   MCV 87.6 80.0 - 100.0 fL   MCH 29.3 26.0 - 34.0 pg   MCHC 33.5 30.0 - 36.0 g/dL   RDW 78.2 (H) 95.6 - 21.3 %   Platelets 120 (L) 150 - 400 K/uL   nRBC 0.0 0.0 - 0.2 %    Comment: Performed at [facility], [address], [place], [place] [address]  Renal function panel     Status: Abnormal   Collection Time: 11/18/22  4:53 AM  Result Value Ref Range   Sodium 137 135 - 145 mmol/L   Potassium 2.9 (L) 3.5 - 5.1 mmol/L   Chloride 105 98 - 111 mmol/L   CO2 22 22 - 32 mmol/L   Glucose, Bld 96 70 - 99 mg/dL    Comment: Glucose reference range applies only to samples taken after fasting for at least 8 hours.   BUN 11 6 - 20 mg/dL   Creatinine, Ser 8.46 0.61 - 1.24 mg/dL   Calcium 8.5 (L) 8.9 - 10.3 mg/dL   Phosphorus 2.6 2.5 - 4.6 mg/dL   Albumin 3.2 (L) 3.5 - 5.0 g/dL   GFR, Estimated >96 >29 mL/min    Comment: (NOTE) Calculated using the CKD-EPI Creatinine Equation (2021)    Anion gap 10 5 - 15    Comment: Performed at [facility], [address] [place], [place] [address]  Glucose, capillary     Status: None   Collection Time: 11/18/22  7:53 AM  Result Value Ref Range   Glucose-Capillary 92 70 - 99  mg/dL    Comment: Glucose reference range applies only to samples taken after fasting for at least 8 hours.    Recent Results (from the past 240 hour(s))  MRSA Next Gen by PCR, Nasal     Status: None   Collection Time: 11/15/22  4:02 PM   Specimen: Nasal Mucosa; Nasal Swab  Result Value Ref Range Status   MRSA by PCR Next Gen NOT DETECTED NOT DETECTED Final    Comment: (NOTE) The GeneXpert MRSA Assay (FDA approved for NASAL specimens only), is one component of a comprehensive MRSA colonization surveillance program. It is not intended to diagnose MRSA infection nor to guide or monitor treatment for MRSA infections. Test performance is not FDA approved in patients less than 55 years old. Performed at [facility], [address], [place], [place] [address]   Culture, Respiratory w Gram Stain     Status: None (Preliminary result)   Collection Time: 11/16/22  3:53 PM   Specimen: Tracheal Aspirate; Respiratory  Result Value Ref Range Status   Specimen Description TRACHEAL ASPIRATE  Final   Special Requests NONE  Final   Gram Stain   Final    RARE SQUAMOUS EPITHELIAL CELLS  PRESENT WBC PRESENT,BOTH PMN AND MONONUCLEAR ABUNDANT GRAM POSITIVE COCCI    Culture   Final    ABUNDANT STAPHYLOCOCCUS AUREUS SUSCEPTIBILITIES TO FOLLOW Performed at [facility], [address] [place], [place] [address]    Report Status PENDING  Incomplete    Lipid Panel Recent Labs    11/16/22 0822  TRIG 78    Studies/Results: No results found.  Medications: Scheduled:  [START ON 12/07/2022] ARIPiprazole ER  400 mg Intramuscular Q28 days   Chlorhexidine Gluconate Cloth  6 each Topical QHS   divalproex  500 mg Oral Daily   folic acid  1 mg Per Tube Daily   heparin  5,000 Units Subcutaneous Q8H   insulin aspart  0-6 Units Subcutaneous Q4H   levETIRAcetam  1,000 mg Oral BID   losartan  50 mg Per Tube Daily   mouth rinse  15 mL Mouth Rinse 4 times per day   thiamine  100 mg Per Tube  Daily   Continuous:  sodium chloride Stopped (11/18/22 9147)    Assessment: 47 y.o. male with a pertinent medical history of depression, bipolar disorder, schizophrenia, and seizures who initially presented to [facility] on 7/12 with recurrent seizures and was subsequently intubated. He is currently on Keppra and Depakote. He was loaded with Vimpat 200mg  and Keppra 60mg /kg at [facility] prior to transfer. Vimpat was not continued as a scheduled dose due to prior side effect of dizziness. He was continued on Keppra and Depakote scheduled dosing. EtOH level was < 10 at [facility]. Not currently using alcohol. History of cocaine and marijuana use. UDS was positive for THC but negative for cocaine. Exam post-extubation: neurologically intact, no focal deficits and at his baseline. His significant other is at the bedside and states he seems back to his normal self. Keppra level was therapeutic at 24.9 on Friday. VPA level 50 on Friday. Significant other report that he gets his medications in a blister pack and that really has increased his compliance. LTM EEG report: Sharp waves, right anterior temporal region; Continuous slow, generalized and lateralized to the right hemisphere. This study showed evidence of epileptogenicity arising from the right anterior temporal region. Additionally, there is evidence of cortical dysfunction arising from the right hemisphere, likely secondary to underlying structural abnormality and/or post-ictal state. Lastly, there is severe diffuse encephalopathy likely related to sedation. No seizures were seen throughout the recording.   Impression: - Breakthrough seizure  - Status epilepticus, resolved on LTM EEG   Recommendations:  - Keppra 1000 mg BID, increased from home dose of 750mg  BID. Continue Depakote 250mg  BID. - we will signoff. Please feel free to contact us with any questions or conerns. - Significant other requesting if it is possible to get the new higher dose of Keppra as a blister  pack as this really has increased his compliance. Will defer this to primary team to see if this is possible at discharge.   LOS: 3 days   Erick Blinks [facility]

## 2022-11-19 ENCOUNTER — Other Ambulatory Visit (HOSPITAL_COMMUNITY): Payer: Self-pay

## 2022-11-19 ENCOUNTER — Other Ambulatory Visit: Payer: Self-pay | Admitting: Family Medicine

## 2022-11-19 ENCOUNTER — Encounter (HOSPITAL_COMMUNITY): Payer: Self-pay | Admitting: Pulmonary Disease

## 2022-11-19 DIAGNOSIS — E876 Hypokalemia: Secondary | ICD-10-CM | POA: Insufficient documentation

## 2022-11-19 DIAGNOSIS — I10 Essential (primary) hypertension: Secondary | ICD-10-CM | POA: Insufficient documentation

## 2022-11-19 DIAGNOSIS — G40901 Epilepsy, unspecified, not intractable, with status epilepticus: Secondary | ICD-10-CM

## 2022-11-19 LAB — POTASSIUM: Potassium: 3.3 mmol/L — ABNORMAL LOW (ref 3.5–5.1)

## 2022-11-19 MED ORDER — LANSOPRAZOLE 30 MG PO CPDR
30.0000 mg | DELAYED_RELEASE_CAPSULE | Freq: Every day | ORAL | 0 refills | Status: AC
  Filled 2022-11-19: qty 3, 3d supply, fill #0

## 2022-11-19 MED ORDER — POTASSIUM CHLORIDE 20 MEQ PO PACK: 40.0000 meq | PACK | Freq: Once | ORAL | Status: DC

## 2022-11-19 MED ORDER — DIVALPROEX SODIUM 250 MG PO DR TAB
250.0000 mg | DELAYED_RELEASE_TABLET | Freq: Three times a day (TID) | ORAL | 0 refills | Status: DC
  Filled 2022-11-19: qty 6, 2d supply, fill #0

## 2022-11-19 MED ORDER — D-1000 25 MCG (1000 UT) PO TABS
1000.0000 [IU] | ORAL_TABLET | Freq: Every day | ORAL | 0 refills | Status: DC
  Filled 2022-11-19: qty 3, 3d supply, fill #0

## 2022-11-19 MED ORDER — D3 25 MCG (1000 UT) PO CAPS: 1000.0000 [IU] | ORAL_CAPSULE | Freq: Every day | ORAL | 3 refills | Status: DC

## 2022-11-19 MED ORDER — DIVALPROEX SODIUM 250 MG PO DR TAB: 250.0000 mg | DELAYED_RELEASE_TABLET | Freq: Two times a day (BID) | ORAL | 3 refills | Status: DC

## 2022-11-19 MED ORDER — D3 25 MCG (1000 UT) PO CAPS
1000.0000 [IU] | ORAL_CAPSULE | Freq: Every day | ORAL | 0 refills | Status: DC
  Filled 2022-11-19: qty 3, 3d supply, fill #0

## 2022-11-19 MED ORDER — LOSARTAN POTASSIUM 50 MG PO TABS
50.0000 mg | ORAL_TABLET | Freq: Every day | ORAL | 0 refills | Status: AC
  Filled 2022-11-19: qty 3, 3d supply, fill #0

## 2022-11-19 MED ORDER — DIVALPROEX SODIUM 250 MG PO DR TAB
250.0000 mg | DELAYED_RELEASE_TABLET | Freq: Two times a day (BID) | ORAL | 0 refills | Status: DC
  Filled 2022-11-19: qty 6, 3d supply, fill #0

## 2022-11-19 MED ORDER — BENZTROPINE MESYLATE 0.5 MG PO TABS
1.0000 mg | ORAL_TABLET | Freq: Two times a day (BID) | ORAL | 0 refills | Status: AC
  Filled 2022-11-19: qty 12, 3d supply, fill #0

## 2022-11-19 MED ORDER — BENZTROPINE MESYLATE 1 MG PO TABS
1.0000 mg | ORAL_TABLET | Freq: Two times a day (BID) | ORAL | 0 refills | Status: DC
  Filled 2022-11-19: qty 6, 3d supply, fill #0

## 2022-11-19 MED ORDER — LEVETIRACETAM 1000 MG PO TABS
1000.0000 mg | ORAL_TABLET | Freq: Every day | ORAL | 0 refills | Status: DC
  Filled 2022-11-19: qty 6, 6d supply, fill #0

## 2022-11-19 MED ORDER — BENZTROPINE MESYLATE 1 MG PO TABS: 1.0000 mg | ORAL_TABLET | Freq: Two times a day (BID) | ORAL | 3 refills | Status: DC

## 2022-11-19 MED ORDER — LOSARTAN POTASSIUM 50 MG PO TABS
50.0000 mg | ORAL_TABLET | Freq: Every day | ORAL | 0 refills | Status: DC
  Filled 2022-11-19: qty 3, 3d supply, fill #0

## 2022-11-19 MED ORDER — LANSOPRAZOLE 30 MG PO CPDR: 30.0000 mg | DELAYED_RELEASE_CAPSULE | Freq: Every day | ORAL | 3 refills | Status: DC

## 2022-11-19 MED ORDER — LANSOPRAZOLE 30 MG PO CPDR
30.0000 mg | DELAYED_RELEASE_CAPSULE | Freq: Every day | ORAL | 0 refills | Status: DC
  Filled 2022-11-19: qty 3, 3d supply, fill #0

## 2022-11-19 MED ORDER — LEVETIRACETAM 1000 MG PO TABS
1000.0000 mg | ORAL_TABLET | Freq: Two times a day (BID) | ORAL | 0 refills | Status: DC
  Filled 2022-11-19: qty 6, 3d supply, fill #0

## 2022-11-19 MED ORDER — LEVETIRACETAM 1000 MG PO TABS: 1000.0000 mg | ORAL_TABLET | Freq: Two times a day (BID) | ORAL | 3 refills | Status: DC

## 2022-11-19 MED ORDER — LOSARTAN POTASSIUM 50 MG PO TABS: 50.0000 mg | ORAL_TABLET | Freq: Every day | ORAL | 3 refills | Status: DC

## 2022-11-19 NOTE — Progress Notes (Signed)
Provided patient with 3 days supply from hospital, and then resume blister packs on Friday.  Discussed with [facility].

## 2022-11-19 NOTE — Care Management Important Message (Signed)
Important Message  Patient Details  Name: Luis Hunter MRN: [id number] Date of Birth: [birth year]   Medicare Important Message Given:  Yes     [name] 11/19/2022, 2:10 PM

## 2022-11-19 NOTE — Progress Notes (Signed)
Reviewed AVS with patient and family at bedside.  Patient verbalized understanding of medication changes.  Copy of AVS given to patient.

## 2022-11-19 NOTE — Progress Notes (Signed)
[name] came to the discharge lounge to ensure the patient understood his medication discharge instructions. Patient voiced full understanding. Awaiting TOC medications to be delivered to the D/C lounge

## 2022-11-19 NOTE — Discharge Summary (Signed)
Physician Discharge Summary   Patient: Luis Hunter MRN: [id number] DOB: 12/22/1975  Admit date:     11/15/2022  Discharge date: 11/19/22  Discharge Physician: Alberteen Sam   PCP: [facility]     Recommendations at discharge:  Follow-up with PCP for blood pressure check in 1 week Follow-up with neurology for seizures in 1-2 weeks     Discharge Diagnoses: Principal Problem:   Status epilepticus (HCC) Active Problems:   Schizophrenia (HCC)   Bipolar disorder (HCC)   Polysubstance abuse (HCC)   Acute metabolic encephalopathy   Essential hypertension   Hypokalemia      Hospital Course: Luis Hunter is a 47 y.o. M with history of seizure disorder, polysubstance abuse, schizo affective disorder with bipolar who presented to [facility] after multiple witnessed seizures.  In the ER at [facility], he had ongoing seizures and so was intubated loaded with Keppra and Vimpat, and started on propofol.  He had recently been taken off Vimpat in June due to balance and dizziness, so he was transferred to [facility] for long-term EEG monitoring    Status epilepticus Patient was admitted, loaded with Keppra and Depakote, and propofol.  Neurology were consulted and he went on continuous EEG.  Continuous EEG was stable on Keppra and Depakote, his Keppra level was increased at thousand twice daily, and Depakote continued at 250 twice daily.  Patient prefers blister packs, and so this was coordinated with his outpatient pharmacy (he was provided 3 days of pills in bottles from [facility] pharmacy, and blister packs to be delivered on Friday)  Keppra up to 1000 BID Depakote 250 BID continued Vimpat stopped     Hypertension Patient was started on losartan while he was in the hospital.  Recommend PCP follow-up.        The [facility] Controlled Substances Registry was reviewed for this patient prior to discharge.  Consultants: Neurology, Critical Care Procedures  performed:  LTM EEG CT head  Disposition: Home Diet recommendation:  Discharge Diet Orders (From admission, onward)     Start     Ordered   11/19/22 0000  Diet - low sodium heart healthy        11/19/22 1213             DISCHARGE MEDICATION: Allergies as of 11/19/2022       Reactions   Lacosamide    EOSINOPHILIA   Lacosamide Other (See Comments)   Eosinophilia   Valproic Acid And Related    hyperammonemia   Valproic Acid And Related Other (See Comments)   hyperannonemia   Lamictal [lamotrigine] Rash        Medication List     STOP taking these medications    lacosamide 50 MG Tabs tablet Commonly known as: VIMPAT   OXcarbazepine 150 MG tablet Commonly known as: TRILEPTAL   sulfamethoxazole-trimethoprim 800-160 MG tablet Commonly known as: BACTRIM DS       TAKE these medications    ARIPiprazole ER 400 MG Prsy prefilled syringe Commonly known as: ABILIFY MAINTENA Inject 400 mg into the muscle every 28 (twenty-eight) days.   Abilify Asimtufii 960 MG/3.2ML Prsy Generic drug: ARIPiprazole ER Inject 960 mg into the muscle every 2 (two) months.   ARIPiprazole ER 400 MG Prsy prefilled syringe Commonly known as: ABILIFY MAINTENA Inject 400 mg into the muscle every 30 (thirty) days. Resume after discharge from hospital. Do not take concurrently with PO/VT Abilify.   benztropine 0.5 MG tablet Commonly known as: COGENTIN Take  2 tablets (1 mg total) by mouth 2 (two) times daily. Start taking on: November 22, 2022   divalproex 250 MG DR tablet Commonly known as: DEPAKOTE Take 1 tablet (250 mg total) by mouth 2 (two) times daily. Start taking on: November 22, 2022   lansoprazole 30 MG capsule Commonly known as: Prevacid Take 1 capsule (30 mg total) by mouth daily. Start taking on: November 22, 2022   levETIRAcetam 1000 MG tablet Commonly known as: KEPPRA Take 1 tablet (1,000 mg total) by mouth 2 (two) times daily. Start taking on: November 22, 2022 What changed:   when to take this These instructions start on November 22, 2022. If you are unsure what to do until then, ask your doctor or other care provider. Another medication with the same name was removed. Continue taking this medication, and follow the directions you see here.   losartan 50 MG tablet Commonly known as: COZAAR Take 1 tablet (50 mg total) by mouth daily. Start taking on: November 22, 2022   ondansetron 4 MG disintegrating tablet Commonly known as: ZOFRAN-ODT Take 1 tablet (4 mg total) by mouth every 6 (six) hours as needed for nausea or vomiting.   vitamin D3 25 MCG tablet Commonly known as: CHOLECALCIFEROL Take 1 tablet (1,000 Units total) by mouth daily. Start taking on: November 22, 2022         Discharge Instructions     Diet - low sodium heart healthy   Complete by: As directed    Discharge instructions   Complete by: As directed    **IMPORTANT DISCHARGE INSTRUCTIONS**   From Dr. Maryfrances Bunnell: You were admitted for seizures Here, we found that you were having back to back seizures.  Before admission, your seizure medicines were levetiracetam (Keppra), lacosamide (Vimpat) and divalproex (Depakote)  The seizure doctors here INCREASED your levetiracetam (Keppra) from 750 mg twice daily to 1000 mg twice daily They kept the divalproex/Depakote the same They STOPPED the lacosamide/Vimpat  For the next three days, take your pills from the bottle: Seizure: Levetiracetam/Keppra 1000 mg twice daily Divalproex/Depakote DR 250 mg twice daily   Blood pressure (new medicine): Losartan 50 mg daily in the morning   Mental health medicine: Benztropine 1 mg twice daily    Heart burn pill: Lansoprazole/Prevacid 30 mg once daily   Vitamin: Vitamin D3/cholecalciferol 25 mcg daily    [facility] Take the pills from the bottle this week, start taking the blister pack INSTEAD, on Friday (you should be out of everything  by then)  Call your neurologist for an appointment in 2 weeks  Go see your primary care doctor for blood pressure follow up in 1 week   Increase activity slowly   Complete by: As directed        Discharge Exam: Filed Weights   11/16/22 0545 11/17/22 0345 11/19/22 0500  Weight: 78.5 kg 78.9 kg 72.5 kg    General: Pt is alert, awake, not in acute distress, sitting up in bed, feels well Cardiovascular: RRR, nl S1-S2, no murmurs appreciated.   No LE edema.   Respiratory: Normal respiratory rate and rhythm.  CTAB without rales or wheezes. Abdominal: Abdomen soft and non-tender.  No distension or HSM.   Neuro/Psych: Strength symmetric in upper and lower extremities.  Judgment and insight appear normal, dysarthric at baseline but mentating well.   Condition at discharge: good  The results of significant diagnostics from this hospitalization (including imaging, microbiology, ancillary and  laboratory) are listed below for reference.   Imaging Studies: ECHOCARDIOGRAM COMPLETE  Result Date: 11/16/2022    ECHOCARDIOGRAM REPORT   Patient Name:   Luis Hunter Date of Exam: 11/16/2022 Medical Rec #:  [id number]     Height:       71.0 in Accession #:    [id number]    Weight:       173.1 lb Date of Birth:  [birth year]     BSA:          1.983 m Patient Age:    47 years      BP:           110/81 mmHg Patient Gender: M             HR:           63 bpm. Exam Location:  Inpatient Procedure: 2D Echo Indications:    Hypotension  History:        Patient has prior history of Echocardiogram examinations, most                 recent 12/25/2020. Signs/Symptoms:seizures; Risk Factors:Current                 Smoker.  Sonographer:    Delcie Roch RDCS Referring Phys: [phone] PAULA B SIMPSON  Sonographer Comments: Echo performed with patient supine and on artificial respirator. Image acquisition challenging due to respiratory motion. IMPRESSIONS  1. Left ventricular ejection fraction, by estimation, is 45%. The left  ventricle has mildly decreased function. The left ventricle has no regional wall motion abnormalities. Left ventricular diastolic parameters were normal.  2. Right ventricular systolic function is normal. The right ventricular size is normal.  3. Small to moderate circumferential pericardial effusion is present.  4. The mitral valve is normal in structure. Trivial mitral valve regurgitation. No evidence of mitral stenosis.  5. The aortic valve was not well visualized. Aortic valve regurgitation is not visualized. No aortic stenosis is present. Comparison(s): Changes from prior study are noted. LVEF worsened from 50-55% to 45% now. FINDINGS  Left Ventricle: Left ventricular ejection fraction, by estimation, is 45%. The left ventricle has mildly decreased function. The left ventricle has no regional wall motion abnormalities. The left ventricular internal cavity size was normal in size. There is no left ventricular hypertrophy. Left ventricular diastolic parameters were normal. Right Ventricle: The right ventricular size is normal. No increase in right ventricular wall thickness. Right ventricular systolic function is normal. Left Atrium: Left atrial size was normal in size. Right Atrium: Right atrial size was normal in size. Pericardium: Small to moderate circumferential pericardial effusion is present. Mitral Valve: The mitral valve is normal in structure. Trivial mitral valve regurgitation. No evidence of mitral valve stenosis. Tricuspid Valve: The tricuspid valve is normal in structure. Tricuspid valve regurgitation is trivial. No evidence of tricuspid stenosis. Aortic Valve: The aortic valve was not well visualized. Aortic valve regurgitation is not visualized. No aortic stenosis is present. Pulmonic Valve: The pulmonic valve was not well visualized. Pulmonic valve regurgitation is not visualized. No evidence of pulmonic stenosis. Aorta: The aortic root is normal in size and structure. Venous: IVC assessment for  right atrial pressure unable to be performed due to mechanical ventilation. IAS/Shunts: No atrial level shunt detected by color flow Doppler.  LEFT VENTRICLE PLAX 2D LVIDd:         4.70 cm     Diastology LVIDs:         3.30 cm  LV e' medial:    9.03 cm/s LV PW:         1.10 cm     LV E/e' medial:  4.9 LV IVS:        1.00 cm     LV e' lateral:   9.90 cm/s LVOT diam:     2.30 cm     LV E/e' lateral: 4.4 LVOT Area:     4.15 cm  LV Volumes (MOD) LV vol d, MOD A4C: 95.1 ml LV vol s, MOD A4C: 43.2 ml LV SV MOD A4C:     95.1 ml RIGHT VENTRICLE            IVC RV Basal diam:  3.30 cm    IVC diam: 2.10 cm RV S prime:     9.57 cm/s TAPSE (M-mode): 1.8 cm LEFT ATRIUM           Index        RIGHT ATRIUM           Index LA diam:      2.70 cm 1.36 cm/m   RA Area:     13.90 cm LA Vol (A4C): 30.9 ml 15.58 ml/m  RA Volume:   37.40 ml  18.86 ml/m   AORTA Ao Root diam: 3.20 cm MITRAL VALVE MV Area (PHT): 3.65 cm    SHUNTS MV Decel Time: 208 msec    Systemic Diam: 2.30 cm MV E velocity: 44.00 cm/s MV A velocity: 39.20 cm/s MV E/A ratio:  1.12 Vishnu Priya Mallipeddi Electronically signed by Winfield Rast Mallipeddi Signature Date/Time: 11/16/2022/3:05:52 PM    Final    Overnight EEG with video  Result Date: 11/16/2022 Charlsie Quest, MD     11/17/2022  8:04 AM Patient Name: Luis Hunter MRN: [id number] Epilepsy Attending: Charlsie Quest Referring Physician/Provider: Caryl Pina, MD Duration: 11/15/2022 1926 to 11/16/2022 1926 Patient history:  47 y.o. male with a past medical history of depression, bipolar disorder, schizophrenia, and seizures who initially presented with recurrent seizures. EEG to evaluate for seizure. Level of alertness:  comatose AEDs during EEG study: LEV, Propofol, versed Technical aspects: This EEG study was done with scalp electrodes positioned according to the 10-20 International system of electrode placement. Electrical activity was reviewed with band pass filter of 1-70Hz , sensitivity of 7  uV/mm, display speed of 47mm/sec with a 60Hz  notched filter applied as appropriate. EEG data were recorded continuously and digitally stored.  Video monitoring was available and reviewed as appropriate. Description: EEG showed continuous generalized and lateralized right hemisphere 3-7hz  theta-delta slowing admixed with 13-15Hz  fronto-central beta activity as well. Hyperventilation and photic stimulation were not performed.   ABNORMALITY - Continuous slow, generalized  and lateralized right hemisphere IMPRESSION: This study is suggestive of cortical dysfunction arising from right hemisphere likely secondary to underlying structural abnormality, post-ictal state. Additionally there is severe diffuse encephalopathy likely related to sedation. No seizures or epileptiform discharges were seen throughout the recording. Charlsie Quest   DG CHEST PORT 1 VIEW  Result Date: 11/15/2022 CLINICAL DATA:  ETT adjustment EXAM: PORTABLE CHEST 1 VIEW COMPARISON:  11/15/2022, 03/09/2022 FINDINGS: Endotracheal tube tip about 4.9 cm superior to carina. Esophageal tube tip below the diaphragm but incompletely visualized. Clear lung fields. Stable cardiomediastinal silhouette IMPRESSION: Endotracheal tube tip about 4.9 cm superior to carina. Electronically Signed   By: Jasmine Pang M.D.   On: 11/15/2022 23:06   DG Abd Portable 1V  Result Date: 11/15/2022 CLINICAL DATA:  NG tube placement  EXAM: PORTABLE ABDOMEN - 1 VIEW COMPARISON:  02/12/2021 FINDINGS: Tip and side port in the NG tube are noted within the fundus of the stomach. Bowel gas pattern is nonspecific. Pelvis is not included in its entirety. IMPRESSION: Tip of NG tube is seen in the fundus of the stomach. Electronically Signed   By: Ernie Avena M.D.   On: 11/15/2022 18:15   DG Chest Port 1 View  Result Date: 11/15/2022 CLINICAL DATA:  Endotracheal tube placement EXAM: PORTABLE CHEST 1 VIEW COMPARISON:  Previous studies including the examination done  earlier today FINDINGS: Tip of endotracheal tube is 6 cm above the carina. Tip of NG tube is seen in the stomach. Transverse diameter of heart is increased. There are no signs of pulmonary edema or focal pulmonary consolidation. Apices of both lungs are not included in the radiographs. There is no pleural effusion or pneumothorax. IMPRESSION: There are no new infiltrates or signs of pulmonary edema. Tip of NG tube is seen in the fundus of the stomach. Electronically Signed   By: Ernie Avena M.D.   On: 11/15/2022 18:14   CT Head Wo Contrast  Result Date: 11/15/2022 CLINICAL DATA:  Seizure disorder, clinical change EXAM: CT HEAD WITHOUT CONTRAST TECHNIQUE: Contiguous axial images were obtained from the base of the skull through the vertex without intravenous contrast. RADIATION DOSE REDUCTION: This exam was performed according to the departmental dose-optimization program which includes automated exposure control, adjustment of the mA and/or kV according to patient size and/or use of iterative reconstruction technique. COMPARISON:  CT head 03/09/22 FINDINGS: Brain: No evidence of acute infarction, hemorrhage, hydrocephalus, extra-axial collection or mass lesion/mass effect. Vascular: No hyperdense vessel or unexpected calcification. Skull: Normal. Negative for fracture or focal lesion. Sinuses/Orbits: No middle ear or mastoid effusion. Paranasal sinuses are clear. Orbits are unremarkable. Other: None. IMPRESSION: No acute intracranial abnormality. Electronically Signed   By: Lorenza Cambridge M.D.   On: 11/15/2022 13:27   DG Chest Portable 1 View  Result Date: 11/15/2022 CLINICAL DATA:  s/p intubation EXAM: PORTABLE CHEST 1 VIEW COMPARISON:  CXR 11/15/22 FINDINGS: Endotracheal tube terminates approximately 5 cm above carina. No pleural effusion. No pneumothorax. Unchanged cardiac and mediastinal contours. There are prominent bilateral interstitial opacities that could represent atypical infection or  pulmonary venous congestion. No radiographically apparent displaced rib fracture. Visualized upper abdomen is unremarkable. IMPRESSION: Endotracheal tube terminates approximately 5 cm above carina. Electronically Signed   By: Lorenza Cambridge M.D.   On: 11/15/2022 12:10   DG Chest Portable 1 View  Result Date: 11/15/2022 CLINICAL DATA:  AMS EXAM: PORTABLE CHEST 1 VIEW COMPARISON:  CXR 03/09/22 FINDINGS: No pleural effusion. No pneumothorax. Normal cardiac and mediastinal contours. No focal airspace opacity. No radiographically apparent displaced rib fracture. Visualized upper abdomen is unremarkable. IMPRESSION: No focal airspace opacity Electronically Signed   By: Lorenza Cambridge M.D.   On: 11/15/2022 11:22    Microbiology: Results for orders placed or performed during the hospital encounter of 11/15/22  MRSA Next Gen by PCR, Nasal     Status: None   Collection Time: 11/15/22  4:02 PM   Specimen: Nasal Mucosa; Nasal Swab  Result Value Ref Range Status   MRSA by PCR Next Gen NOT DETECTED NOT DETECTED Final    Comment: (NOTE) The GeneXpert MRSA Assay (FDA approved for NASAL specimens only), is one component of a comprehensive MRSA colonization surveillance program. It is not intended to diagnose MRSA infection nor to guide or monitor treatment for MRSA infections.  Test performance is not FDA approved in patients less than 81 years old. Performed at [facility], [address], [place], [place] [address]   Culture, Respiratory w Gram Stain     Status: None   Collection Time: 11/16/22  3:53 PM   Specimen: Tracheal Aspirate; Respiratory  Result Value Ref Range Status   Specimen Description TRACHEAL ASPIRATE  Final   Special Requests NONE  Final   Gram Stain   Final    RARE SQUAMOUS EPITHELIAL CELLS PRESENT WBC PRESENT,BOTH PMN AND MONONUCLEAR ABUNDANT GRAM POSITIVE COCCI Performed at [facility], [address] [place], [place] [address]    Culture ABUNDANT STAPHYLOCOCCUS  AUREUS  Final   Report Status 11/18/2022 FINAL  Final   Organism ID, Bacteria STAPHYLOCOCCUS AUREUS  Final      Susceptibility   Staphylococcus aureus - MIC*    CIPROFLOXACIN <=0.5 SENSITIVE Sensitive     ERYTHROMYCIN >=8 RESISTANT Resistant     GENTAMICIN <=0.5 SENSITIVE Sensitive     OXACILLIN <=0.25 SENSITIVE Sensitive     TETRACYCLINE <=1 SENSITIVE Sensitive     VANCOMYCIN 1 SENSITIVE Sensitive     TRIMETH/SULFA <=10 SENSITIVE Sensitive     CLINDAMYCIN <=0.25 SENSITIVE Sensitive     RIFAMPIN <=0.5 SENSITIVE Sensitive     Inducible Clindamycin NEGATIVE Sensitive     LINEZOLID 2 SENSITIVE Sensitive     * ABUNDANT STAPHYLOCOCCUS AUREUS    Labs: CBC: Recent Labs  Lab 11/15/22 1026 11/16/22 0822 11/17/22 0423 11/18/22 0453  WBC 10.6* 12.9* 13.3* 11.9*  NEUTROABS 6.8  --   --   --   HGB 14.7 13.1 14.4 13.2  HCT 45.4 38.9* 43.3 39.4  MCV 89.7 88.8 88.0 87.6  PLT 156 101* 116* 120*   Basic Metabolic Panel: Recent Labs  Lab 11/15/22 1026 11/16/22 0822 11/17/22 0423 11/18/22 0453 11/19/22 0745  NA 138 140 139 137  --   K 3.8 4.1 4.1 2.9* 3.3*  CL 106 106 108 105  --   CO2 15* 28 21* 22  --   GLUCOSE 124* 90 120* 96  --   BUN 13 9 12 11   --   CREATININE 1.15 0.97 0.93 0.86  --   CALCIUM 9.0 8.4* 8.8* 8.5*  --   MG 1.9 2.0  --   --   --   PHOS  --  2.8 3.2 2.6  --    Liver Function Tests: Recent Labs  Lab 11/15/22 1026 11/16/22 0822 11/17/22 0423 11/18/22 0453  AST 41  --   --   --   ALT 21  --   --   --   ALKPHOS 51  --   --   --   BILITOT 0.4  --   --   --   PROT 7.2  --   --   --   ALBUMIN 3.9 3.2* 3.3* 3.2*   CBG: Recent Labs  Lab 11/17/22 2037 11/18/22 0037 11/18/22 0356 11/18/22 0753 11/18/22 1226  GLUCAP 129* 116* 95 92 99    Discharge time spent: approximately 35 minutes spent on discharge counseling, evaluation of patient on day of discharge, and coordination of discharge planning with nursing, social work, pharmacy and case  management  Signed: Alberteen Sam, MD [facility] 11/19/2022

## 2022-11-19 NOTE — Progress Notes (Incomplete)
Nutrition Follow-up  DOCUMENTATION CODES:   Not applicable  INTERVENTION:  ***   NUTRITION DIAGNOSIS:   Inadequate oral intake related to inability to eat as evidenced by NPO status.  GOAL:   Patient will meet greater than or equal to 90% of their needs  MONITOR:   TF tolerance, Vent status  REASON FOR ASSESSMENT:   Consult Enteral/tube feeding initiation and management  ASSESSMENT:   Pt with PMH significant for seizures, polysubstance and tobacco, schizophrenia, depression who presented after multiple witnessed seizures.  Meds reviewed: folic acid, cozaar, klor-con, thiamine. Labs reviewed: K low.     NUTRITION - FOCUSED PHYSICAL EXAM:  {RD Focused Exam List:21252}  Diet Order:   Diet Order             Diet regular Fluid consistency: Thin  Diet effective now                   EDUCATION NEEDS:   Not appropriate for education at this time  Skin:  Skin Assessment: Reviewed RN Assessment  Last BM:  type 7 - 7/15  Height:   Ht Readings from Last 1 Encounters:  11/15/22 5\' 11"  (1.803 m)    Weight:   Wt Readings from Last 1 Encounters:  11/19/22 72.5 kg    Ideal Body Weight:  78.2 kg  BMI:  Body mass index is 22.29 kg/m.  Estimated Nutritional Needs:   Kcal:  1913  Protein:  100-120 grams  Fluid:  1.9-2.1 L  Bethann Humble, RD, LDN, CNSC.

## 2022-12-04 MED FILL — Midazolam HCl Inj 5 MG/5ML (Base Equivalent): INTRAMUSCULAR | Qty: 2 | Status: AC

## 2023-04-13 ENCOUNTER — Other Ambulatory Visit: Payer: Self-pay

## 2023-04-13 ENCOUNTER — Emergency Department
Admission: EM | Admit: 2023-04-13 | Discharge: 2023-04-13 | Disposition: A | Discharge status: Home / Self Care | Payer: Medicare HMO | Attending: Emergency Medicine | Admitting: Emergency Medicine

## 2023-04-13 ENCOUNTER — Emergency Department: Payer: Medicare HMO

## 2023-04-13 DIAGNOSIS — R569 Unspecified convulsions: Secondary | ICD-10-CM | POA: Insufficient documentation

## 2023-04-13 LAB — CBC WITH DIFFERENTIAL/PLATELET
Abs Immature Granulocytes: 0.12 10*3/uL — ABNORMAL HIGH (ref 0.00–0.07)
Basophils Absolute: 0 10*3/uL (ref 0.0–0.1)
Basophils Relative: 0 %
Eosinophils Absolute: 0.1 10*3/uL (ref 0.0–0.5)
Eosinophils Relative: 2 %
HCT: 41.5 % (ref 39.0–52.0)
Hemoglobin: 13.9 g/dL (ref 13.0–17.0)
Immature Granulocytes: 2 %
Lymphocytes Relative: 19 %
Lymphs Abs: 1.6 10*3/uL (ref 0.7–4.0)
MCH: 29.4 pg (ref 26.0–34.0)
MCHC: 33.5 g/dL (ref 30.0–36.0)
MCV: 87.7 fL (ref 80.0–100.0)
Monocytes Absolute: 0.7 10*3/uL (ref 0.1–1.0)
Monocytes Relative: 9 %
Neutro Abs: 5.6 10*3/uL (ref 1.7–7.7)
Neutrophils Relative %: 68 %
Platelets: 162 10*3/uL (ref 150–400)
RBC: 4.73 MIL/uL (ref 4.22–5.81)
RDW: 16 % — ABNORMAL HIGH (ref 11.5–15.5)
WBC: 8.2 10*3/uL (ref 4.0–10.5)
nRBC: 0 % (ref 0.0–0.2)

## 2023-04-13 LAB — COMPREHENSIVE METABOLIC PANEL
ALT: 17 U/L (ref 0–44)
AST: 19 U/L (ref 15–41)
Albumin: 3.9 g/dL (ref 3.5–5.0)
Alkaline Phosphatase: 49 U/L (ref 38–126)
Anion gap: 9 (ref 5–15)
BUN: 17 mg/dL (ref 6–20)
CO2: 25 mmol/L (ref 22–32)
Calcium: 8.5 mg/dL — ABNORMAL LOW (ref 8.9–10.3)
Chloride: 103 mmol/L (ref 98–111)
Creatinine, Ser: 0.81 mg/dL (ref 0.61–1.24)
GFR, Estimated: >60 mL/min (ref >60)
Glucose, Bld: 111 mg/dL — ABNORMAL HIGH (ref 70–99)
Potassium: 3.8 mmol/L (ref 3.5–5.1)
Sodium: 137 mmol/L (ref 135–145)
Total Bilirubin: 0.6 mg/dL (ref <1.2)
Total Protein: 7 g/dL (ref 6.5–8.1)

## 2023-04-13 LAB — VALPROIC ACID LEVEL: Valproic Acid Lvl: 33 ug/mL — ABNORMAL LOW (ref 50.0–100.0)

## 2023-04-13 MED ORDER — LEVETIRACETAM 1000 MG PO TABS: 1000.0000 mg | ORAL_TABLET | Freq: Two times a day (BID) | ORAL | 0 refills | Status: DC

## 2023-04-13 MED ORDER — KETOROLAC TROMETHAMINE 15 MG/ML IJ SOLN
15.0000 mg | Freq: Once | INTRAMUSCULAR | Status: AC
  Administered 2023-04-13: 15 mg via INTRAVENOUS
  Filled 2023-04-13: qty 1

## 2023-04-13 MED ORDER — LORAZEPAM 2 MG/ML IJ SOLN
2.0000 mg | Freq: Once | INTRAMUSCULAR | Status: AC
  Administered 2023-04-13: 2 mg via INTRAVENOUS
  Filled 2023-04-13: qty 1

## 2023-04-13 MED ORDER — LEVETIRACETAM IN NACL 1500 MG/100ML IV SOLN
1500.0000 mg | Freq: Once | INTRAVENOUS | Status: AC
  Administered 2023-04-13: 1500 mg via INTRAVENOUS
  Filled 2023-04-13: qty 100

## 2023-04-13 MED ORDER — ONDANSETRON HCL 4 MG/2ML IJ SOLN
4.0000 mg | Freq: Once | INTRAMUSCULAR | Status: AC
  Administered 2023-04-13: 4 mg via INTRAVENOUS
  Filled 2023-04-13: qty 2

## 2023-04-13 NOTE — ED Triage Notes (Signed)
pt in via [facility] for seizures. hx of epilepisy. Per mother pt has been taking meds.   Per EMS: [id number] gave 2.5 of versed ago.

## 2023-04-13 NOTE — ED Provider Notes (Signed)
Procedures  Clinical Course as of 04/13/23 1642  Sun Apr 13, 2023  1105 Patient given Ativan due to agitation and restlessness to help him get through CT scan.  There was no witnessed seizure activity during this time.  Reassessed and currently sleeping likely from the Ativan.  Will reassess once more awake for disposition. [DW]  1214 Patient is starting to wake up more after the Ativan.  Will wait for him to p.o. challenge and ambulate before potential discharge [DW]    Clinical Course User Index [DW] Janith Lima, MD    ----------------------------------------- 4:42 PM on 04/13/2023 -----------------------------------------  Patient is awake and alert, oriented.  Significant other is here with him and can take him home.  No vomiting, appears to be back to baseline, she agrees.  ----------------------------------------- 5:08 PM on 04/13/2023 ----------------------------------------- Ambulatory and tolerating p.o.   Sharman Cheek, MD 04/13/23 [phone]

## 2023-04-13 NOTE — Discharge Instructions (Signed)
Please follow-up with your neurologist in 1 week for reassessment.  Please make sure you are taking all your medications as prescribed.

## 2023-04-13 NOTE — ED Provider Notes (Signed)
[facility] Provider Note    Event Date/Time   First MD Initiated Contact with Patient 04/13/23 [phone]     (approximate)   History   Seizures   HPI Luis Hunter is a 47 y.o. male with history of seizures, bipolar disorder, schizophrenia presenting today for seizure.  EMS reports being called to home due to patient having seizure-like episode witnessed by mom.  He was given 7.5 mg Versed on the way to the ED and was still postictal on arrival.  Per mom, was reportedly feeling well recently with no other acute symptoms.  No reported missed doses of his seizure medications at home.  Chart review: Patient had recent admission on 11/15/2022 for seizure episodes that became status epilepticus and required intubation.  He was discharged on Keppra and Depakote.     Physical Exam   Triage Vital Signs: ED Triage Vitals [04/13/23 0916]  Encounter Vitals Group     BP      Systolic BP Percentile      Diastolic BP Percentile      Pulse      Resp      Temp      Temp src      SpO2      Weight 159 lb 13.3 oz (72.5 kg)     Height 5\' 11"  (1.803 m)     Head Circumference      Peak Flow      Pain Score      Pain Loc      Pain Education      Exclude from Growth Chart     Most recent vital signs: Vitals:   04/13/23 1245 04/13/23 1300  BP:  (!) 139/102  Pulse: 72 74  Resp:  11  Temp:    SpO2: 98% 99%    I have reviewed the vital signs. General: Awake but postictal on arrival and confused.  Mumbling words. Head:  Normocephalic, Atraumatic. EENT:  PERRL, EOMI, Oral mucosa pink and moist, Neck is supple. Cardiovascular: Regular rate, 2+ distal pulses. Respiratory:  Normal respiratory effort, symmetrical expansion, no distress.   Extremities:  Moving all four extremities through full ROM without pain.   Neuro: Awake but not oriented.  Postictal.  Able to mumble words but nothing further.  Moving all 4 extremities. Skin:  Warm, dry, no rash.   Psych:  Appropriate affect.     ED Results / Procedures / Treatments   Labs (all labs ordered are listed, but only abnormal results are displayed) Labs Reviewed  CBC WITH DIFFERENTIAL/PLATELET - Abnormal; Notable for the following components:      Result Value   RDW 16.0 (*)    Abs Immature Granulocytes 0.12 (*)    All other components within normal limits  COMPREHENSIVE METABOLIC PANEL - Abnormal; Notable for the following components:   Glucose, Bld 111 (*)    Calcium 8.5 (*)    All other components within normal limits  VALPROIC ACID LEVEL - Abnormal; Notable for the following components:   Valproic Acid Lvl 33 (*)    All other components within normal limits  LEVETIRACETAM LEVEL  URINE DRUG SCREEN, QUALITATIVE ([facility] ONLY)     EKG    RADIOLOGY Independently interpreted CT head with no acute pathology   PROCEDURES:  Critical Care performed: No  Procedures   MEDICATIONS ORDERED IN ED: Medications  levETIRAcetam (KEPPRA) IVPB 1500 mg/ 100 mL premix (0 mg Intravenous Stopped 04/13/23 1047)  ondansetron (ZOFRAN) injection 4  mg (4 mg Intravenous Given 04/13/23 0946)  LORazepam (ATIVAN) injection 2 mg (2 mg Intravenous Given 04/13/23 1011)  ketorolac (TORADOL) 15 MG/ML injection 15 mg (15 mg Intravenous Given 04/13/23 1243)     IMPRESSION / MDM / ASSESSMENT AND PLAN / ED COURSE  I reviewed the triage vital signs and the nursing notes.                              Differential diagnosis includes, but is not limited to, seizure, subtherapeutic levels  Patient's presentation is most consistent with exacerbation of chronic illness.  Patient is a 47 year old male presenting today for seizure with history of the same.  Appears postictal on arrival but no other acute focal neurological deficits.  Unsure if he has missed any doses but his valproic acid level today was slightly low.  Patient was given Keppra load here with 1.5 g.  CT head was performed given unwitnessed seizure  activity and possible head injury.  This was negative for acute pathology.  Laboratory workup otherwise reassuring.  Patient required Ativan to go through CT scan due to agitation and not sitting still.  Afterwards, he had prolonged drowsiness and difficult to awake to get back to his baseline.  Reassessed near end of shift and he was starting to wake up more but still not completely awake.  Patient was signed out to oncoming provider pending reassessment back to baseline and tolerating ambulation before discharge.  No other seizure activity witnessed throughout entire ED visit.  The patient is on the cardiac monitor to evaluate for evidence of arrhythmia and/or significant heart rate changes. Clinical Course as of 04/13/23 1513  Sun Apr 13, 2023  1105 Patient given Ativan due to agitation and restlessness to help him get through CT scan.  There was no witnessed seizure activity during this time.  Reassessed and currently sleeping likely from the Ativan.  Will reassess once more awake for disposition. [DW]  1214 Patient is starting to wake up more after the Ativan.  Will wait for him to p.o. challenge and ambulate before potential discharge [DW]    Clinical Course User Index [DW] Janith Lima, MD     FINAL CLINICAL IMPRESSION(S) / ED DIAGNOSES   Final diagnoses:  Seizure-like activity (HCC)     Rx / DC Orders   ED Discharge Orders     None        Note:  This document was prepared using Dragon voice recognition software and may include unintentional dictation errors.   Janith Lima, MD 04/13/23 1515

## 2023-04-13 NOTE — ED Notes (Signed)
Pt continues to pull off cardiac monitoring.

## 2023-04-13 NOTE — ED Notes (Signed)
This RN responding to loud noises and yelling coming from rm. Upon entering pt was sitting in recliner wrapped up in monitoring cords. NT Jewel Baize RN, and this RN helped to get pt back into the bed. Pt very unsteady at this time, Per family member pt voided on the floor. Pt responds to some questions. Responds to physical stimulus but stills is not at baseline.

## 2023-04-13 NOTE — ED Notes (Signed)
Pt stating "my head is going to explode!" Orders received and given. Pt bed wet from where pt voided at this time. Clean linens applied with new chux pad placed. Pt placed back on the monitor.

## 2023-04-14 LAB — LEVETIRACETAM LEVEL: Levetiracetam Lvl: 8.9 ug/mL — ABNORMAL LOW (ref 10.0–40.0)

## 2023-05-08 ENCOUNTER — Other Ambulatory Visit: Payer: Self-pay | Admitting: Family Medicine

## 2023-05-08 DIAGNOSIS — R222 Localized swelling, mass and lump, trunk: Secondary | ICD-10-CM

## 2023-05-12 ENCOUNTER — Ambulatory Visit
Admission: RE | Admit: 2023-05-12 | Discharge: 2023-05-12 | Disposition: A | Discharge status: Home / Self Care | Payer: Medicare HMO | Source: Ambulatory Visit | Attending: Family Medicine | Admitting: Family Medicine

## 2023-05-12 DIAGNOSIS — R222 Localized swelling, mass and lump, trunk: Secondary | ICD-10-CM | POA: Diagnosis present

## 2023-06-05 ENCOUNTER — Other Ambulatory Visit: Payer: Self-pay | Admitting: Family Medicine

## 2023-06-05 DIAGNOSIS — R911 Solitary pulmonary nodule: Secondary | ICD-10-CM

## 2023-06-16 ENCOUNTER — Ambulatory Visit
Admission: RE | Admit: 2023-06-16 | Discharge: 2023-06-16 | Disposition: A | Discharge status: Home / Self Care | Payer: Medicare HMO | Source: Ambulatory Visit | Attending: Family Medicine | Admitting: Family Medicine

## 2023-06-16 DIAGNOSIS — R911 Solitary pulmonary nodule: Secondary | ICD-10-CM | POA: Diagnosis present

## 2023-07-01 ENCOUNTER — Other Ambulatory Visit: Payer: Self-pay

## 2023-07-01 ENCOUNTER — Emergency Department
Admission: EM | Admit: 2023-07-01 | Discharge: 2023-07-01 | Disposition: A | Discharge status: Home / Self Care | Payer: Medicare HMO | Attending: Emergency Medicine | Admitting: Emergency Medicine

## 2023-07-01 ENCOUNTER — Emergency Department: Payer: Medicare HMO

## 2023-07-01 DIAGNOSIS — D72829 Elevated white blood cell count, unspecified: Secondary | ICD-10-CM | POA: Insufficient documentation

## 2023-07-01 DIAGNOSIS — R1013 Epigastric pain: Secondary | ICD-10-CM | POA: Diagnosis not present

## 2023-07-01 DIAGNOSIS — R112 Nausea with vomiting, unspecified: Secondary | ICD-10-CM | POA: Insufficient documentation

## 2023-07-01 DIAGNOSIS — R1032 Left lower quadrant pain: Secondary | ICD-10-CM | POA: Insufficient documentation

## 2023-07-01 DIAGNOSIS — R1012 Left upper quadrant pain: Secondary | ICD-10-CM | POA: Insufficient documentation

## 2023-07-01 DIAGNOSIS — R109 Unspecified abdominal pain: Secondary | ICD-10-CM | POA: Diagnosis present

## 2023-07-01 LAB — CBC WITH DIFFERENTIAL/PLATELET
Abs Immature Granulocytes: 0.24 10*3/uL — ABNORMAL HIGH (ref 0.00–0.07)
Basophils Absolute: 0 10*3/uL (ref 0.0–0.1)
Basophils Relative: 0 %
Eosinophils Absolute: 0.2 10*3/uL (ref 0.0–0.5)
Eosinophils Relative: 1 %
HCT: 43.9 % (ref 39.0–52.0)
Hemoglobin: 14.7 g/dL (ref 13.0–17.0)
Immature Granulocytes: 2 %
Lymphocytes Relative: 12 %
Lymphs Abs: 1.7 10*3/uL (ref 0.7–4.0)
MCH: 29.4 pg (ref 26.0–34.0)
MCHC: 33.5 g/dL (ref 30.0–36.0)
MCV: 87.8 fL (ref 80.0–100.0)
Monocytes Absolute: 1.2 10*3/uL — ABNORMAL HIGH (ref 0.1–1.0)
Monocytes Relative: 9 %
Neutro Abs: 10.2 10*3/uL — ABNORMAL HIGH (ref 1.7–7.7)
Neutrophils Relative %: 76 %
Platelets: 191 10*3/uL (ref 150–400)
RBC: 5 MIL/uL (ref 4.22–5.81)
RDW: 15.6 % — ABNORMAL HIGH (ref 11.5–15.5)
WBC: 13.6 10*3/uL — ABNORMAL HIGH (ref 4.0–10.5)
nRBC: 0 % (ref 0.0–0.2)

## 2023-07-01 LAB — URINALYSIS, ROUTINE W REFLEX MICROSCOPIC
Bilirubin Urine: NEGATIVE
Glucose, UA: NEGATIVE mg/dL
Hgb urine dipstick: NEGATIVE
Ketones, ur: 5 mg/dL — AB
Leukocytes,Ua: NEGATIVE
Nitrite: NEGATIVE
Protein, ur: NEGATIVE mg/dL
Specific Gravity, Urine: >1.046 — ABNORMAL HIGH (ref 1.005–1.030)
pH: 8 (ref 5.0–8.0)

## 2023-07-01 LAB — COMPREHENSIVE METABOLIC PANEL
ALT: 18 U/L (ref 0–44)
AST: 21 U/L (ref 15–41)
Albumin: 4.4 g/dL (ref 3.5–5.0)
Alkaline Phosphatase: 48 U/L (ref 38–126)
Anion gap: 9 (ref 5–15)
BUN: 21 mg/dL — ABNORMAL HIGH (ref 6–20)
CO2: 27 mmol/L (ref 22–32)
Calcium: 9.3 mg/dL (ref 8.9–10.3)
Chloride: 102 mmol/L (ref 98–111)
Creatinine, Ser: 0.94 mg/dL (ref 0.61–1.24)
GFR, Estimated: >60 mL/min (ref >60)
Glucose, Bld: 96 mg/dL (ref 70–99)
Potassium: 3.9 mmol/L (ref 3.5–5.1)
Sodium: 138 mmol/L (ref 135–145)
Total Bilirubin: 0.7 mg/dL (ref 0.0–1.2)
Total Protein: 7.4 g/dL (ref 6.5–8.1)

## 2023-07-01 LAB — LIPASE, BLOOD: Lipase: 47 U/L (ref 11–51)

## 2023-07-01 MED ORDER — CAPSAICIN 0.1 % EX CREA: TOPICAL_CREAM | CUTANEOUS | 0 refills | Status: DC

## 2023-07-01 MED ORDER — FAMOTIDINE IN NACL 20-0.9 MG/50ML-% IV SOLN
20.0000 mg | Freq: Once | INTRAVENOUS | Status: AC
  Administered 2023-07-01: 20 mg via INTRAVENOUS
  Filled 2023-07-01: qty 50

## 2023-07-01 MED ORDER — ONDANSETRON HCL 4 MG/2ML IJ SOLN
4.0000 mg | Freq: Once | INTRAMUSCULAR | Status: AC
  Administered 2023-07-01: 4 mg via INTRAVENOUS
  Filled 2023-07-01: qty 2

## 2023-07-01 MED ORDER — SODIUM CHLORIDE 0.9 % IV BOLUS
1000.0000 mL | Freq: Once | INTRAVENOUS | Status: AC
  Administered 2023-07-01: 1000 mL via INTRAVENOUS

## 2023-07-01 MED ORDER — DROPERIDOL 2.5 MG/ML IJ SOLN
2.5000 mg | Freq: Once | INTRAMUSCULAR | Status: AC
  Administered 2023-07-01: 2.5 mg via INTRAVENOUS
  Filled 2023-07-01: qty 2

## 2023-07-01 MED ORDER — BENZTROPINE MESYLATE 0.5 MG PO TABS
0.2500 mg | ORAL_TABLET | Freq: Once | ORAL | Status: AC
  Administered 2023-07-01: 0.25 mg via ORAL
  Filled 2023-07-01: qty 1

## 2023-07-01 MED ORDER — FAMOTIDINE 20 MG PO TABS: 20.0000 mg | ORAL_TABLET | Freq: Two times a day (BID) | ORAL | 0 refills | Status: AC

## 2023-07-01 MED ORDER — IOHEXOL 300 MG/ML  SOLN
100.0000 mL | Freq: Once | INTRAMUSCULAR | Status: AC | PRN
  Administered 2023-07-01: 100 mL via INTRAVENOUS

## 2023-07-01 MED ORDER — DIVALPROEX SODIUM 250 MG PO DR TAB
250.0000 mg | DELAYED_RELEASE_TABLET | Freq: Once | ORAL | Status: AC
  Administered 2023-07-01: 250 mg via ORAL
  Filled 2023-07-01: qty 1

## 2023-07-01 MED ORDER — ONDANSETRON 4 MG PO TBDP: 4.0000 mg | ORAL_TABLET | Freq: Three times a day (TID) | ORAL | 0 refills | Status: DC | PRN

## 2023-07-01 MED ORDER — MORPHINE SULFATE (PF) 4 MG/ML IV SOLN
6.0000 mg | Freq: Once | INTRAVENOUS | Status: AC
  Administered 2023-07-01: 6 mg via INTRAVENOUS
  Filled 2023-07-01: qty 2

## 2023-07-01 MED ORDER — LEVETIRACETAM 500 MG PO TABS
500.0000 mg | ORAL_TABLET | Freq: Once | ORAL | Status: AC
  Administered 2023-07-01: 500 mg via ORAL
  Filled 2023-07-01: qty 1

## 2023-07-01 NOTE — ED Provider Notes (Signed)
 [facility] Provider Note    Event Date/Time   First MD Initiated Contact with Patient 07/01/23 1834     (approximate)   History   Abdominal Pain   HPI  Luis Hunter is a 48 y.o. male past medical history significant for seizure disorder, presents to the emergency department with abdominal pain and not feeling well.  States that significant pain to the left side of his abdomen.  Endorses nausea and vomiting.  Denies any blood in his stool.  Denies any history of kidney stone.  Prior feeding tube.  Has a history of epilepsy and states that he has been unable to keep his seizure medications down.  Denies any radiation to his testicles.  Denies significant alcohol use.  Endorses significant marijuana use on a daily basis.  Also endorses eating a significant amount of spicy foods.  Took ibuprofen just prior to arrival.     Physical Exam   Triage Vital Signs: ED Triage Vitals  Encounter Vitals Group     BP 07/01/23 1701 (!) 147/106     Systolic BP Percentile --      Diastolic BP Percentile --      Pulse Rate 07/01/23 1701 87     Resp 07/01/23 1701 18     Temp 07/01/23 1702 98.6 F (37 C)     Temp Source 07/01/23 1702 Oral     SpO2 07/01/23 1701 94 %     Weight 07/01/23 1702 180 lb (81.6 kg)     Height 07/01/23 1702 5\' 11"  (1.803 m)     Head Circumference --      Peak Flow --      Pain Score 07/01/23 1701 9     Pain Loc --      Pain Education --      Exclude from Growth Chart --     Most recent vital signs: Vitals:   07/01/23 2201 07/01/23 2300  BP: (!) 142/90   Pulse: 89 93  Resp: 16   Temp: 98.4 F (36.9 C)   SpO2: 98% 97%    Physical Exam Constitutional:      Appearance: He is well-developed.  HENT:     Head: Atraumatic.  Eyes:     Conjunctiva/sclera: Conjunctivae normal.  Cardiovascular:     Rate and Rhythm: Regular rhythm.  Pulmonary:     Effort: No respiratory distress.  Abdominal:     Tenderness: There is abdominal  tenderness in the epigastric area, left upper quadrant and left lower quadrant. There is no right CVA tenderness or left CVA tenderness.  Musculoskeletal:     Cervical back: Normal range of motion.  Skin:    General: Skin is warm.     Capillary Refill: Capillary refill takes less than 2 seconds.  Neurological:     Mental Status: He is alert. Mental status is at baseline.     IMPRESSION / MDM / ASSESSMENT AND PLAN / ED COURSE  I reviewed the triage vital signs and the nursing notes.  Differential diagnosis including hyperemesis syndrome, gastritis/PUD, diverticulitis, intra-abdominal abscess, small bowel obstruction, viral gastroenteritis, viral illness including COVID/influenza.  No pain that radiates to the testicles, have a low suspicion for testicular torsion.  EKG  I, Corena Herter, the attending physician, personally viewed and interpreted this ECG.   Rate: Normal  Rhythm: Normal sinus  Axis: Normal  Intervals: Normal  ST&T Change: None  No tachycardic or bradycardic dysrhythmias while on cardiac telemetry.  RADIOLOGY CT  scan read as no acute findings to explain the patient's abdominal pain.  LABS (all labs ordered are listed, but only abnormal results are displayed) Labs interpreted as -    Labs Reviewed  COMPREHENSIVE METABOLIC PANEL - Abnormal; Notable for the following components:      Result Value   BUN 21 (*)    All other components within normal limits  CBC WITH DIFFERENTIAL/PLATELET - Abnormal; Notable for the following components:   WBC 13.6 (*)    RDW 15.6 (*)    Neutro Abs 10.2 (*)    Monocytes Absolute 1.2 (*)    Abs Immature Granulocytes 0.24 (*)    All other components within normal limits  URINALYSIS, ROUTINE W REFLEX MICROSCOPIC - Abnormal; Notable for the following components:   Color, Urine YELLOW (*)    APPearance CLEAR (*)    Specific Gravity, Urine >1.046 (*)    Ketones, ur 5 (*)    All other components within normal limits  LIPASE,  BLOOD     MDM    Patient with mild leukocytosis.  Creatinine appears to be at baseline.  No significant electrolyte abnormality.  Hemoglobin level within normal limits.  Urine with no signs of urinary tract infection.  CT scan with no findings of acute diverticulitis.  Patient was given IV fluids, IV antiemetics  On reevaluation continued to have nausea and was not feeling well, given IV droperidol given concern for possible hyperemesis syndrome and IV Pepcid for possible gastritis.  On reevaluation patient states that he is feeling much better.  Able to keep down fluids.  Was given his home seizure medication of Keppra, Depakote and Cogentin which she tolerated.  Discussed marijuana cessation.  Discussed close follow-up with primary care physician and to call them tomorrow to schedule close follow-up appointment.  Discussed returning to the emergency department if he had return of his symptoms and he was unable to keep down his home medications.  Given a prescription for antiemetics, capsaicin and Pepcid.  No obvious signs or symptoms of a significant GI bleed.   PROCEDURES:  Critical Care performed: No  Procedures  Patient's presentation is most consistent with acute presentation with potential threat to life or bodily function.   MEDICATIONS ORDERED IN ED: Medications  sodium chloride 0.9 % bolus 1,000 mL (0 mLs Intravenous Stopped 07/01/23 2033)  morphine (PF) 4 MG/ML injection 6 mg (6 mg Intravenous Given 07/01/23 1948)  ondansetron (ZOFRAN) injection 4 mg (4 mg Intravenous Given 07/01/23 1948)  benztropine (COGENTIN) tablet 0.25 mg (0.25 mg Oral Given 07/01/23 2033)  levETIRAcetam (KEPPRA) tablet 500 mg (500 mg Oral Given 07/01/23 2033)  divalproex (DEPAKOTE) DR tablet 250 mg (250 mg Oral Given 07/01/23 2033)  iohexol (OMNIPAQUE) 300 MG/ML solution 100 mL (100 mLs Intravenous Contrast Given 07/01/23 2020)  droperidol (INAPSINE) 2.5 MG/ML injection 2.5 mg (2.5 mg Intravenous Given  07/01/23 2157)  famotidine (PEPCID) IVPB 20 mg premix (0 mg Intravenous Stopped 07/01/23 2246)    FINAL CLINICAL IMPRESSION(S) / ED DIAGNOSES   Final diagnoses:  Epigastric pain  Nausea and vomiting, unspecified vomiting type     Rx / DC Orders   ED Discharge Orders          Ordered    famotidine (PEPCID) 20 MG tablet  2 times daily        07/01/23 2305    ondansetron (ZOFRAN-ODT) 4 MG disintegrating tablet  Every 8 hours PRN        07/01/23 2305  Capsaicin 0.1 % CREA        07/01/23 2305             Note:  This document was prepared using Dragon voice recognition software and may include unintentional dictation errors.   Corena Herter, MD 07/02/23 0001

## 2023-07-01 NOTE — ED Triage Notes (Signed)
 Patient states abdominal pain with N/V/D since 1430.

## 2023-07-01 NOTE — Discharge Instructions (Addendum)
 You were seen in the emergency department for abdominal pain with nausea and vomiting.  You had a CT scan that did not show an obvious reason for your pain today.  Your lab work was overall normal.  Concerned that it could be gastritis or symptoms of hyperemesis syndrome.  Do not smoke any marijuana for multiple months and see if this helps your symptoms.  Avoid spicy foods and avoid eating before laying down.  Call and follow-up closely with your primary care physician.  Return to the emergency department if you have worsening symptoms.  zofran (ondansetron) - nausea medication, take 1 tablet every 8 hours as needed for nausea/vomiting.  Thank you for choosing [place] for your health care, it was my pleasure to care for you today!  Corena Herter, MD

## 2023-07-01 NOTE — ED Notes (Signed)
 Pt to radiology at this time.

## 2023-07-01 NOTE — ED Notes (Signed)
 See triage notes. Patient c/o abdominal pain and N/V/D that started today.

## 2023-07-01 NOTE — ED Notes (Signed)
 Pt c/o abd pain and nausea. Mumma, md notied.

## 2023-08-15 ENCOUNTER — Emergency Department (EMERGENCY_DEPARTMENT_HOSPITAL)
Admission: EM | Admit: 2023-08-15 | Discharge: 2023-08-15 | Disposition: A | Discharge status: Against Medical Advice | Source: Home / Self Care | Attending: Emergency Medicine | Admitting: Emergency Medicine

## 2023-08-15 ENCOUNTER — Emergency Department

## 2023-08-15 ENCOUNTER — Other Ambulatory Visit: Payer: Self-pay

## 2023-08-15 DIAGNOSIS — K36 Other appendicitis: Secondary | ICD-10-CM | POA: Diagnosis not present

## 2023-08-15 DIAGNOSIS — K3580 Unspecified acute appendicitis: Secondary | ICD-10-CM | POA: Insufficient documentation

## 2023-08-15 DIAGNOSIS — G40409 Other generalized epilepsy and epileptic syndromes, not intractable, without status epilepticus: Secondary | ICD-10-CM | POA: Diagnosis not present

## 2023-08-15 LAB — BASIC METABOLIC PANEL WITH GFR
Anion gap: 11 (ref 5–15)
BUN: 12 mg/dL (ref 6–20)
CO2: 22 mmol/L (ref 22–32)
Calcium: 9.2 mg/dL (ref 8.9–10.3)
Chloride: 107 mmol/L (ref 98–111)
Creatinine, Ser: 0.84 mg/dL (ref 0.61–1.24)
GFR, Estimated: >60 mL/min (ref >60)
Glucose, Bld: 110 mg/dL — ABNORMAL HIGH (ref 70–99)
Potassium: 3.4 mmol/L — ABNORMAL LOW (ref 3.5–5.1)
Sodium: 140 mmol/L (ref 135–145)

## 2023-08-15 LAB — CBC
HCT: 42.3 % (ref 39.0–52.0)
Hemoglobin: 14.5 g/dL (ref 13.0–17.0)
MCH: 29.7 pg (ref 26.0–34.0)
MCHC: 34.3 g/dL (ref 30.0–36.0)
MCV: 86.5 fL (ref 80.0–100.0)
Platelets: 112 10*3/uL — ABNORMAL LOW (ref 150–400)
RBC: 4.89 MIL/uL (ref 4.22–5.81)
RDW: 16 % — ABNORMAL HIGH (ref 11.5–15.5)
WBC: 10.4 10*3/uL (ref 4.0–10.5)
nRBC: 0 % (ref 0.0–0.2)

## 2023-08-15 LAB — HEPATIC FUNCTION PANEL
ALT: 31 U/L (ref 0–44)
AST: 50 U/L — ABNORMAL HIGH (ref 15–41)
Albumin: 3.7 g/dL (ref 3.5–5.0)
Alkaline Phosphatase: 40 U/L (ref 38–126)
Bilirubin, Direct: 0.2 mg/dL (ref 0.0–0.2)
Indirect Bilirubin: 0.8 mg/dL (ref 0.3–0.9)
Total Bilirubin: 1 mg/dL (ref 0.0–1.2)
Total Protein: 6.7 g/dL (ref 6.5–8.1)

## 2023-08-15 LAB — TROPONIN I (HIGH SENSITIVITY)
Troponin I (High Sensitivity): 12 ng/L (ref <18)
Troponin I (High Sensitivity): 8 ng/L (ref <18)

## 2023-08-15 LAB — VALPROIC ACID LEVEL: Valproic Acid Lvl: 101 ug/mL — ABNORMAL HIGH (ref 50.0–100.0)

## 2023-08-15 LAB — LIPASE, BLOOD: Lipase: 20 U/L (ref 11–51)

## 2023-08-15 MED ORDER — PANTOPRAZOLE SODIUM 40 MG IV SOLR
40.0000 mg | Freq: Once | INTRAVENOUS | Status: AC
  Administered 2023-08-15: 40 mg via INTRAVENOUS
  Filled 2023-08-15: qty 10

## 2023-08-15 MED ORDER — ASPIRIN 81 MG PO CHEW
324.0000 mg | CHEWABLE_TABLET | Freq: Once | ORAL | Status: AC
Start: 2023-08-15 — End: 2023-08-15
  Administered 2023-08-15: 324 mg via ORAL
  Filled 2023-08-15: qty 4

## 2023-08-15 MED ORDER — PIPERACILLIN-TAZOBACTAM 3.375 G IVPB 30 MIN
3.3750 g | Freq: Once | INTRAVENOUS | Status: AC
  Administered 2023-08-15: 3.375 g via INTRAVENOUS
  Filled 2023-08-15 (×2): qty 50

## 2023-08-15 MED ORDER — FAMOTIDINE IN NACL 20-0.9 MG/50ML-% IV SOLN
20.0000 mg | Freq: Once | INTRAVENOUS | Status: AC
  Administered 2023-08-15: 20 mg via INTRAVENOUS
  Filled 2023-08-15: qty 50

## 2023-08-15 MED ORDER — ONDANSETRON HCL 4 MG/2ML IJ SOLN
4.0000 mg | Freq: Once | INTRAMUSCULAR | Status: AC
  Administered 2023-08-15: 4 mg via INTRAVENOUS
  Filled 2023-08-15: qty 2

## 2023-08-15 MED ORDER — IOHEXOL 300 MG/ML  SOLN
100.0000 mL | Freq: Once | INTRAMUSCULAR | Status: AC | PRN
  Administered 2023-08-15: 100 mL via INTRAVENOUS

## 2023-08-15 NOTE — ED Notes (Signed)
 Pt returned from CT

## 2023-08-15 NOTE — ED Notes (Signed)
Report called to Grace, RN.

## 2023-08-15 NOTE — ED Provider Notes (Signed)
 [place] EMERGENCY DEPARTMENT AT [facility] Provider Note   CSN: [id number] Arrival date & time: 08/15/23  1519     History  Chief Complaint  Patient presents with   Abdominal Pain    Luis Hunter is a 48 y.o. male.  Patient presents with abdominal pain intermittent for the past several months significantly worsened today mostly lower abdominal.  Denies any cough congestion fevers chills.  Has had 8 episodes of emesis still nauseous today.  He also notes epigastric abdominal/chest pain feels like heartburn.  Nonradiating.   Abdominal Pain Associated symptoms: chest pain, nausea and vomiting   Associated symptoms: no chills, no diarrhea and no fever        Home Medications Prior to Admission medications   Medication Sig Start Date End Date Taking? Authorizing Provider  ARIPiprazole ER (ABILIFY MAINTENA) 400 MG PRSY prefilled syringe Inject 400 mg into the muscle every 30 (thirty) days. Resume after discharge from hospital. Do not take concurrently with PO/VT Abilify. 01/16/21  Yes Shelli Dexter M, PA-C  ARIPiprazole ER (ABILIFY MAINTENA) 400 MG PRSY prefilled syringe Inject 400 mg into the muscle every 28 (twenty-eight) days.   Yes [provider]  benztropine (COGENTIN) 0.5 MG tablet Take 2 tablets (1 mg total) by mouth 2 (two) times daily. 11/22/22 11/22/23 Yes Danford, Willis Harter, MD  Cholecalciferol (D-1000) 25 MCG (1000 UT) tablet Take 1 tablet (1,000 Units total) by mouth daily. 11/22/22  Yes Danford, Willis Harter, MD  divalproex (DEPAKOTE) 250 MG DR tablet Take 1 tablet (250 mg total) by mouth 2 (two) times daily. 11/22/22  Yes Danford, Willis Harter, MD  famotidine (PEPCID) 20 MG tablet Take 1 tablet (20 mg total) by mouth 2 (two) times daily. 07/01/23 08/15/23 Yes Mumma, Cathleen Coach, MD  lansoprazole (PREVACID) 30 MG capsule Take 1 capsule (30 mg total) by mouth daily. 11/22/22 11/22/23 Yes Danford, Willis Harter, MD  levETIRAcetam (KEPPRA) 750 MG tablet  Take 750 mg by mouth 2 (two) times daily.   Yes [provider]  losartan (COZAAR) 50 MG tablet Take 1 tablet (50 mg total) by mouth daily. 11/22/22  Yes Danford, Willis Harter, MD  Capsaicin 0.1 % CREA Apply cream to upper abdomen as needed for nausea and vomiting.  Can use up to 4 times a day. Patient not taking: Reported on 08/15/2023 07/01/23   Mumma, Shannon, MD  triamcinolone cream (KENALOG) 0.5 % Apply 1 Application topically 2 (two) times daily. Patient not taking: Reported on 08/15/2023 08/13/23   [provider]      Allergies    Lacosamide, Lacosamide, Remeron [mirtazapine], Valproic acid and related, Valproic acid and related, and Lamictal [lamotrigine]    Review of Systems   Review of Systems  Constitutional:  Negative for chills and fever.  Respiratory:  Negative for chest tightness.   Cardiovascular:  Positive for chest pain.  Gastrointestinal:  Positive for abdominal pain, nausea and vomiting. Negative for diarrhea.  Neurological:  Negative for weakness and numbness.    Physical Exam Updated Vital Signs BP (!) 135/97 (BP Location: Right Arm)   Pulse 96   Temp 99 F (37.2 C) (Oral)   Resp 18   Ht 5\' 11"  (1.803 m)   Wt 83.9 kg   SpO2 95%   BMI 25.80 kg/m  Physical Exam Vitals reviewed.  Constitutional:      General: He is not in acute distress.    Appearance: Normal appearance. He is not toxic-appearing.  HENT:     Head:  Normocephalic and atraumatic.     Nose: Nose normal.  Cardiovascular:     Pulses: Normal pulses.  Pulmonary:     Effort: Pulmonary effort is normal.  Abdominal:     General: Abdomen is flat. Bowel sounds are normal. There is no distension.     Palpations: Abdomen is soft.     Tenderness: There is abdominal tenderness in the right lower quadrant and left lower quadrant.     Hernia: No hernia is present.  Musculoskeletal:     Cervical back: Normal range of motion.  Neurological:     Mental Status: He is alert and oriented to  person, place, and time. Mental status is at baseline.  Psychiatric:        Mood and Affect: Mood normal.        Behavior: Behavior normal.     ED Results / Procedures / Treatments   Labs (all labs ordered are listed, but only abnormal results are displayed) Labs Reviewed  BASIC METABOLIC PANEL WITH GFR - Abnormal; Notable for the following components:      Result Value   Potassium 3.4 (*)    Glucose, Bld 110 (*)    All other components within normal limits  CBC - Abnormal; Notable for the following components:   RDW 16.0 (*)    Platelets 112 (*)    All other components within normal limits  VALPROIC ACID LEVEL - Abnormal; Notable for the following components:   Valproic Acid Lvl 101 (*)    All other components within normal limits  LIPASE, BLOOD  HEPATIC FUNCTION PANEL  URINALYSIS, W/ REFLEX TO CULTURE (INFECTION SUSPECTED)  TROPONIN I (HIGH SENSITIVITY)  TROPONIN I (HIGH SENSITIVITY)    EKG EKG Interpretation Date/Time:  Friday August 15 2023 15:28:32 EDT Ventricular Rate:  101 PR Interval:  166 QRS Duration:  92 QT Interval:  332 QTC Calculation: 430 R Axis:   76  Text Interpretation: Sinus tachycardia Moderate voltage criteria for LVH, may be normal variant ( Sokolow-Lyon , Cornell product ) Borderline ECG When compared with ECG of 01-Jul-2023 21:40, Nonspecific T wave abnormality now evident in Inferior leads Nonspecific T wave abnormality now evident in Lateral leads Confirmed by UNCONFIRMED, DOCTOR ([id number]), editor Ara Bays [phone]) on 08/15/2023 3:37:15 PM  Radiology CT ABDOMEN PELVIS W CONTRAST Result Date: 08/15/2023 CLINICAL DATA:  Abdominal pain, acute nonlocalized EXAM: CT ABDOMEN AND PELVIS WITH CONTRAST TECHNIQUE: Multidetector CT imaging of the abdomen and pelvis was performed using the standard protocol following bolus administration of intravenous contrast. RADIATION DOSE REDUCTION: This exam was performed according to the departmental dose-optimization  program which includes automated exposure control, adjustment of the mA and/or kV according to patient size and/or use of iterative reconstruction technique. CONTRAST:  100mL OMNIPAQUE IOHEXOL 300 MG/ML  SOLN COMPARISON:  None Available. FINDINGS: Lower chest: Lung bases are clear. Hepatobiliary: No focal hepatic lesion. Normal gallbladder. No biliary duct dilatation. Common bile duct is normal. Pancreas: Pancreas is normal. No ductal dilatation. No pancreatic inflammation. Spleen: Normal spleen Adrenals/urinary tract: Adrenal glands and kidneys are normal. The ureters and bladder normal. Stomach/Bowel: The stomach, duodenum, and small bowel normal. The cecum extends midline into the lower pelvis. The appendix is dilated and is centrally within the lower abdomen/upper pelvis. Dilated appendix measures 13 mm on coronal image 27/series 5. There is enhancement of the wall the appendix. No perforation. Appendix is also seen on axial image 59/2 measuring 12 mm. Small amount of inflammation surrounding the distended appendix. The colon  and rectosigmoid colon are normal. Vascular/Lymphatic: Abdominal aorta is normal caliber. No periportal or retroperitoneal adenopathy. No pelvic adenopathy. Reproductive: Unremarkable Other: No free fluid. Musculoskeletal: No aggressive osseous lesion. IMPRESSION: 1. Acute appendicitis.  No perforation 2. Appendix in atypical typical location centrally within the upper pelvis/lower abdomen as described above. Electronically Signed   By: Deboraha Fallow M.D.   On: 08/15/2023 19:50   DG Chest 2 View Result Date: 08/15/2023 CLINICAL DATA:  Chest pain EXAM: CHEST - 2 VIEW COMPARISON:  None Available. FINDINGS: The heart size and mediastinal contours are within normal limits. Both lungs are clear. The visualized skeletal structures are unremarkable. IMPRESSION: No active cardiopulmonary disease. Electronically Signed   By: Reagan Camera M.D.   On: 08/15/2023 16:32    Procedures Procedures     Medications Ordered in ED Medications  famotidine (PEPCID) IVPB 20 mg premix (0 mg Intravenous Stopped 08/15/23 1702)  pantoprazole (PROTONIX) injection 40 mg (40 mg Intravenous Given 08/15/23 1635)  aspirin chewable tablet 324 mg (324 mg Oral Given 08/15/23 1636)  iohexol (OMNIPAQUE) 300 MG/ML solution 100 mL (100 mLs Intravenous Contrast Given 08/15/23 1832)  ondansetron (ZOFRAN) injection 4 mg (4 mg Intravenous Given 08/15/23 1943)  piperacillin-tazobactam (ZOSYN) IVPB 3.375 g (0 g Intravenous Stopped 08/15/23 2127)    ED Course/ Medical Decision Making/ A&P                                 Medical Decision Making Patient here for abdominal pain with emesis today also reporting GERD symptoms with epigastric burning.  Does have history of ACS and no elevated troponins previously will check troponins again today and plan for abdominal CT.  Given aspirin Protonix and Pepcid.  CT shows evidence of acute appendicitis no leukocytosis tachycardia or fever.  Troponins are negative.  Given Zosyn.  Contacted general surgeon Rosea Conch who agrees to evaluate recommends hospitalist consultation due to multiple comorbidities and unclear n.p.o. status as patient had something to eat at 5 but then vomited up also had a sip of water when he was here in the ED waiting.  I discussed with hospitalist Vallarie Gauze who will consult on patient.  After evaluation by general surgeon patient notified Dr. Rosea Conch that he would like to leave AGAINST MEDICAL ADVICE.  I did get to discuss with patient we discussed the risks and benefits including the risk of severe infection, sepsis, organ damage, death.  Patient verbalized understanding but he notes that he has to go do something for his daughter and he will come back tomorrow to the emergency department.  He is of sound mind and would like to leave AGAINST MEDICAL ADVICE.  I discussed again with Dr. Rosea Conch who recommends antibiotics here no outpatient antibiotics would be  beneficial.  Amount and/or Complexity of Data Reviewed Labs: ordered. Radiology: ordered.  Risk OTC drugs. Prescription drug management. Decision regarding hospitalization.           Final Clinical Impression(s) / ED Diagnoses Final diagnoses:  Acute appendicitis, unspecified acute appendicitis type    Rx / DC Orders ED Discharge Orders     None         Hollie Luria, Kirby Peoples 08/15/23 2150    Jacquie Maudlin, MD 08/16/23 [phone]

## 2023-08-15 NOTE — ED Notes (Signed)
 See triage note  Presents with mid abd pain  States pain has been there for a while  but became worse last pm  Afebrile  Positive n/v

## 2023-08-15 NOTE — ED Notes (Signed)
 Pt gave verbal consent to update his daughter about his condition.

## 2023-08-15 NOTE — Consult Note (Addendum)
 Subjective:   CC: Acute appendicitis  HPI:  Luis Hunter is a 48 y.o. male who is consulted by reyes for evaluation of  above cc.  Symptoms were first noted 1 days ago. Pain is sharp periumbilical.  Associated with nothing specific, exacerbated by nothing specific     Past Medical History:  has a past medical history of Bipolar disorder (HCC), Depression, Schizophrenia (HCC), and Seizures (HCC).  Past Surgical History:  has a past surgical history that includes Finger surgery; LEFT HEART CATH AND CORONARY ANGIOGRAPHY (N/A, 12/25/2020); and IR GASTROSTOMY TUBE MOD SED (01/16/2021).  Family History: family history is not on file.  Social History:  reports that he has been smoking. He has never used smokeless tobacco. He reports current drug use. Drugs: Marijuana and Cocaine. He reports that he does not drink alcohol.  Current Medications:  Prior to Admission medications   Medication Sig Start Date End Date Taking? Authorizing Provider  ARIPiprazole ER (ABILIFY MAINTENA) 400 MG PRSY prefilled syringe Inject 400 mg into the muscle every 30 (thirty) days. Resume after discharge from hospital. Do not take concurrently with PO/VT Abilify. 01/16/21  Yes Cloyd Stagers M, PA-C  ARIPiprazole ER (ABILIFY MAINTENA) 400 MG PRSY prefilled syringe Inject 400 mg into the muscle every 28 (twenty-eight) days.   Yes [provider]  benztropine (COGENTIN) 0.5 MG tablet Take 2 tablets (1 mg total) by mouth 2 (two) times daily. 11/22/22 11/22/23 Yes Danford, Earl Lites, MD  Cholecalciferol (D-1000) 25 MCG (1000 UT) tablet Take 1 tablet (1,000 Units total) by mouth daily. 11/22/22  Yes Danford, Earl Lites, MD  divalproex (DEPAKOTE) 250 MG DR tablet Take 1 tablet (250 mg total) by mouth 2 (two) times daily. 11/22/22  Yes Danford, Earl Lites, MD  famotidine (PEPCID) 20 MG tablet Take 1 tablet (20 mg total) by mouth 2 (two) times daily. 07/01/23 08/15/23 Yes Mumma, Carollee Herter, MD  lansoprazole (PREVACID) 30  MG capsule Take 1 capsule (30 mg total) by mouth daily. 11/22/22 11/22/23 Yes Danford, Earl Lites, MD  levETIRAcetam (KEPPRA) 750 MG tablet Take 750 mg by mouth 2 (two) times daily.   Yes [provider]  losartan (COZAAR) 50 MG tablet Take 1 tablet (50 mg total) by mouth daily. 11/22/22  Yes Danford, Earl Lites, MD  Capsaicin 0.1 % CREA Apply cream to upper abdomen as needed for nausea and vomiting.  Can use up to 4 times a day. Patient not taking: Reported on 08/15/2023 07/01/23   Corena Herter, MD  triamcinolone cream (KENALOG) 0.5 % Apply 1 Application topically 2 (two) times daily. Patient not taking: Reported on 08/15/2023 08/13/23   [provider]    Allergies:  Allergies as of 08/15/2023 - Review Complete 08/15/2023  Allergen Reaction Noted   Lacosamide  04/26/2021   Lacosamide Other (See Comments) 08/11/2021   Remeron [mirtazapine]  07/01/2023   Valproic acid and related  04/27/2021   Valproic acid and related Other (See Comments) 08/11/2021   Lamictal [lamotrigine] Rash 06/05/2021    ROS:  General: Denies weight loss, weight gain, fatigue, fevers, chills, and night sweats. Eyes: Denies blurry vision, double vision, eye pain, itchy eyes, and tearing. Ears: Denies hearing loss, earache, and ringing in ears. Nose: Denies sinus pain, congestion, infections, runny nose, and nosebleeds. Mouth/throat: Denies hoarseness, sore throat, bleeding gums, and difficulty swallowing. Heart: Denies chest pain, palpitations, racing heart, irregular heartbeat, leg pain or swelling, and decreased activity tolerance. Respiratory: Denies breathing difficulty, shortness of breath, wheezing, cough, and sputum.  GI: Denies change in appetite, heartburn, nausea, vomiting, constipation, diarrhea, and blood in stool. GU: Denies difficulty urinating, pain with urinating, urgency, frequency, blood in urine. Musculoskeletal: Denies joint stiffness, pain, swelling, muscle weakness. Skin:  Denies rash, itching, mass, tumors, sores, and boils Neurologic: Denies headache, fainting, dizziness, seizures, numbness, and tingling. Psychiatric: Denies depression, anxiety, difficulty sleeping, and memory loss. Endocrine: Denies heat or cold intolerance, and increased thirst or urination. Blood/lymph: Denies easy bruising, easy bruising, and swollen glands     Objective:     BP (!) 135/97 (BP Location: Right Arm)   Pulse 96   Temp 99 F (37.2 C) (Oral)   Resp 18   Ht 5\' 11"  (1.803 m)   Wt 83.9 kg   SpO2 95%   BMI 25.80 kg/m    Constitutional :  alert, cooperative, appears stated age, and no distress  Lymphatics/Throat:  no asymmetry, masses, or scars  Respiratory:  clear to auscultation bilaterally  Cardiovascular:  regular rate and rhythm  Gastrointestinal: Soft, no guarding, tenderness to palpation suprapubic area .   Musculoskeletal: Steady gait and movement  Skin: Cool and moist  Psychiatric: Normal affect, non-agitated, not confused       LABS:     Latest Ref Rng & Units 08/15/2023    3:31 PM 07/01/2023    5:04 PM 04/13/2023    9:27 AM  CMP  Glucose 70 - 99 mg/dL 161  96  096   BUN 6 - 20 mg/dL 12  21  17    Creatinine 0.61 - 1.24 mg/dL 0.45  4.09  8.11   Sodium 135 - 145 mmol/L 140  138  137   Potassium 3.5 - 5.1 mmol/L 3.4  3.9  3.8   Chloride 98 - 111 mmol/L 107  102  103   CO2 22 - 32 mmol/L 22  27  25    Calcium 8.9 - 10.3 mg/dL 9.2  9.3  8.5   Total Protein 6.5 - 8.1 g/dL  7.4  7.0   Total Bilirubin 0.0 - 1.2 mg/dL  0.7  0.6   Alkaline Phos 38 - 126 U/L  48  49   AST 15 - 41 U/L  21  19   ALT 0 - 44 U/L  18  17       Latest Ref Rng & Units 08/15/2023    3:31 PM 07/01/2023    5:04 PM 04/13/2023    9:27 AM  CBC  WBC 4.0 - 10.5 K/uL 10.4  13.6  8.2   Hemoglobin 13.0 - 17.0 g/dL 91.4  78.2  95.6   Hematocrit 39.0 - 52.0 % 42.3  43.9  41.5   Platelets 150 - 400 K/uL 112  191  162      RADS: CLINICAL DATA:  Abdominal pain, acute nonlocalized    EXAM: CT ABDOMEN AND PELVIS WITH CONTRAST   TECHNIQUE: Multidetector CT imaging of the abdomen and pelvis was performed using the standard protocol following bolus administration of intravenous contrast.   RADIATION DOSE REDUCTION: This exam was performed according to the departmental dose-optimization program which includes automated exposure control, adjustment of the mA and/or kV according to patient size and/or use of iterative reconstruction technique.   CONTRAST:  OMNIPAQUE IOHEXOL 300 MG/ML  SOLN   COMPARISON:  None Available.   FINDINGS: Lower chest: Lung bases are clear.   Hepatobiliary: No focal hepatic lesion. Normal gallbladder. No biliary duct dilatation. Common bile duct is normal.   Pancreas: Pancreas is normal.  No ductal dilatation. No pancreatic inflammation.   Spleen: Normal spleen   Adrenals/urinary tract: Adrenal glands and kidneys are normal. The ureters and bladder normal.   Stomach/Bowel: The stomach, duodenum, and small bowel normal.   The cecum extends midline into the lower pelvis. The appendix is dilated and is centrally within the lower abdomen/upper pelvis. Dilated appendix measures 13 mm on coronal image 27/series 5. There is enhancement of the wall the appendix. No perforation. Appendix is also seen on axial image 59/2 measuring 12 mm. Small amount of inflammation surrounding the distended appendix.   The colon and rectosigmoid colon are normal.   Vascular/Lymphatic: Abdominal aorta is normal caliber. No periportal or retroperitoneal adenopathy. No pelvic adenopathy.   Reproductive: Unremarkable   Other: No free fluid.   Musculoskeletal: No aggressive osseous lesion.   IMPRESSION: 1. Acute appendicitis.  No perforation 2. Appendix in atypical typical location centrally within the upper pelvis/lower abdomen as described above.     Electronically Signed   By: Genevive Bi M.D.   On: 08/15/2023 19:50 Assessment:       Acute appendicitis  Plan:      Discussed risk benefits, and alternatives.  Patient stating he would like to go home and come back later for antibiotics.  I explained to him that outpatient antibiotic therapy will have a very high chance of recurrence and failure, and IV antibiotics may be a possibility, even though that can also have a chance of recurrence.  Patient verbalized understanding still wishes to go home, stating that he will come back if he feels like he needs antibiotics.  Again, I emphasized this will be outside standard of care and that there is a high likelihood that his appendicitis may worsen and make eventual treatment more difficult, including increasing surgical complications.   Patient again verbalized understanding still wishes to go home.  Surgery will be signing off.  Please call with any additional questions or concerns.  The patient understands the risks, any and all questions were answered to the patient's satisfaction.  labs/images/medications/previous chart entries reviewed personally and relevant changes/updates noted above.

## 2023-08-15 NOTE — ED Notes (Signed)
 Received pt Aox4. Pt resting in bed, currently denies worsening of abdominal pain. Denies current nausea discomfort. Resting quietly in bed, pending bed assignment. Due blood work collected and sent to lab. Safety maintained, all pt specific care ongoing.

## 2023-08-15 NOTE — Hospital Course (Signed)
 Luis Hunter

## 2023-08-15 NOTE — ED Notes (Signed)
 Pt Aox4 signed electronic AMA forms and departed ER. MD and PA aware and spoken to pt. Pt departed ED with steady gait.

## 2023-08-15 NOTE — ED Notes (Signed)
 Patient transported to CT

## 2023-08-15 NOTE — ED Triage Notes (Signed)
 Pt to ed from home via [facility] for abd pain since Tuesday. Got worse today so he called 911. Have HX on abd hernia that he needs surgery on but hasn't yet. Pt has burning in chest with nausea. Pt has HX of reflux as well but has not taken any PO meds for same. EKG for ems abnormal but no copy provided to staff.  153/90 99% RA  92 BGL 128

## 2023-08-16 ENCOUNTER — Other Ambulatory Visit: Payer: Self-pay

## 2023-08-16 ENCOUNTER — Emergency Department

## 2023-08-16 ENCOUNTER — Inpatient Hospital Stay
Admission: EM | Admit: 2023-08-16 | Discharge: 2023-08-20 | DRG: 398 | Disposition: A | Discharge status: Home / Self Care | Attending: Internal Medicine | Admitting: Internal Medicine

## 2023-08-16 DIAGNOSIS — W06XXXA Fall from bed, initial encounter: Secondary | ICD-10-CM | POA: Diagnosis not present

## 2023-08-16 DIAGNOSIS — G230 Hallervorden-Spatz disease: Secondary | ICD-10-CM | POA: Diagnosis present

## 2023-08-16 DIAGNOSIS — R569 Unspecified convulsions: Secondary | ICD-10-CM

## 2023-08-16 DIAGNOSIS — G9340 Encephalopathy, unspecified: Secondary | ICD-10-CM | POA: Diagnosis present

## 2023-08-16 DIAGNOSIS — I10 Essential (primary) hypertension: Secondary | ICD-10-CM | POA: Diagnosis present

## 2023-08-16 DIAGNOSIS — Z79899 Other long term (current) drug therapy: Secondary | ICD-10-CM

## 2023-08-16 DIAGNOSIS — Z23 Encounter for immunization: Secondary | ICD-10-CM

## 2023-08-16 DIAGNOSIS — F259 Schizoaffective disorder, unspecified: Secondary | ICD-10-CM | POA: Diagnosis present

## 2023-08-16 DIAGNOSIS — M25562 Pain in left knee: Secondary | ICD-10-CM | POA: Insufficient documentation

## 2023-08-16 DIAGNOSIS — E872 Acidosis, unspecified: Secondary | ICD-10-CM | POA: Diagnosis present

## 2023-08-16 DIAGNOSIS — F1910 Other psychoactive substance abuse, uncomplicated: Secondary | ICD-10-CM | POA: Diagnosis present

## 2023-08-16 DIAGNOSIS — K37 Unspecified appendicitis: Secondary | ICD-10-CM | POA: Diagnosis not present

## 2023-08-16 DIAGNOSIS — F17200 Nicotine dependence, unspecified, uncomplicated: Secondary | ICD-10-CM | POA: Diagnosis present

## 2023-08-16 DIAGNOSIS — R4182 Altered mental status, unspecified: Secondary | ICD-10-CM

## 2023-08-16 DIAGNOSIS — G9341 Metabolic encephalopathy: Secondary | ICD-10-CM | POA: Diagnosis present

## 2023-08-16 DIAGNOSIS — F319 Bipolar disorder, unspecified: Secondary | ICD-10-CM | POA: Diagnosis present

## 2023-08-16 DIAGNOSIS — N179 Acute kidney failure, unspecified: Secondary | ICD-10-CM | POA: Insufficient documentation

## 2023-08-16 DIAGNOSIS — Y92239 Unspecified place in hospital as the place of occurrence of the external cause: Secondary | ICD-10-CM | POA: Diagnosis not present

## 2023-08-16 DIAGNOSIS — M25511 Pain in right shoulder: Secondary | ICD-10-CM | POA: Diagnosis not present

## 2023-08-16 DIAGNOSIS — K36 Other appendicitis: Principal | ICD-10-CM | POA: Diagnosis present

## 2023-08-16 DIAGNOSIS — Z91410 Personal history of adult physical and sexual abuse: Secondary | ICD-10-CM

## 2023-08-16 DIAGNOSIS — D696 Thrombocytopenia, unspecified: Secondary | ICD-10-CM

## 2023-08-16 DIAGNOSIS — Z888 Allergy status to other drugs, medicaments and biological substances status: Secondary | ICD-10-CM

## 2023-08-16 DIAGNOSIS — S0181XA Laceration without foreign body of other part of head, initial encounter: Secondary | ICD-10-CM | POA: Diagnosis present

## 2023-08-16 DIAGNOSIS — F209 Schizophrenia, unspecified: Secondary | ICD-10-CM | POA: Diagnosis present

## 2023-08-16 DIAGNOSIS — G40409 Other generalized epilepsy and epileptic syndromes, not intractable, without status epilepticus: Secondary | ICD-10-CM | POA: Diagnosis present

## 2023-08-16 DIAGNOSIS — Z8489 Family history of other specified conditions: Secondary | ICD-10-CM

## 2023-08-16 DIAGNOSIS — S8002XA Contusion of left knee, initial encounter: Secondary | ICD-10-CM | POA: Diagnosis not present

## 2023-08-16 DIAGNOSIS — F05 Delirium due to known physiological condition: Secondary | ICD-10-CM | POA: Diagnosis present

## 2023-08-16 DIAGNOSIS — K3580 Unspecified acute appendicitis: Principal | ICD-10-CM

## 2023-08-16 DIAGNOSIS — R441 Visual hallucinations: Secondary | ICD-10-CM | POA: Insufficient documentation

## 2023-08-16 DIAGNOSIS — A419 Sepsis, unspecified organism: Secondary | ICD-10-CM

## 2023-08-16 DIAGNOSIS — R21 Rash and other nonspecific skin eruption: Secondary | ICD-10-CM | POA: Diagnosis not present

## 2023-08-16 LAB — URINE DRUG SCREEN, QUALITATIVE (ARMC ONLY)
Amphetamines, Ur Screen: NOT DETECTED
Barbiturates, Ur Screen: NOT DETECTED
Benzodiazepine, Ur Scrn: POSITIVE — AB
Cannabinoid 50 Ng, Ur ~~LOC~~: POSITIVE — AB
Cocaine Metabolite,Ur ~~LOC~~: NOT DETECTED
MDMA (Ecstasy)Ur Screen: NOT DETECTED
Methadone Scn, Ur: NOT DETECTED
Opiate, Ur Screen: NOT DETECTED
Phencyclidine (PCP) Ur S: NOT DETECTED
Tricyclic, Ur Screen: NOT DETECTED

## 2023-08-16 LAB — CBC WITH DIFFERENTIAL/PLATELET
Abs Immature Granulocytes: 0.19 10*3/uL — ABNORMAL HIGH (ref 0.00–0.07)
Basophils Absolute: 0.1 10*3/uL (ref 0.0–0.1)
Basophils Relative: 1 %
Eosinophils Absolute: 0.1 10*3/uL (ref 0.0–0.5)
Eosinophils Relative: 0 %
HCT: 45.8 % (ref 39.0–52.0)
Hemoglobin: 14.5 g/dL (ref 13.0–17.0)
Immature Granulocytes: 1 %
Lymphocytes Relative: 4 %
Lymphs Abs: 0.8 10*3/uL (ref 0.7–4.0)
MCH: 29.5 pg (ref 26.0–34.0)
MCHC: 31.7 g/dL (ref 30.0–36.0)
MCV: 93.3 fL (ref 80.0–100.0)
Monocytes Absolute: 1.7 10*3/uL — ABNORMAL HIGH (ref 0.1–1.0)
Monocytes Relative: 8 %
Neutro Abs: 19.3 10*3/uL — ABNORMAL HIGH (ref 1.7–7.7)
Neutrophils Relative %: 86 %
Platelets: 109 10*3/uL — ABNORMAL LOW (ref 150–400)
RBC: 4.91 MIL/uL (ref 4.22–5.81)
RDW: 16.7 % — ABNORMAL HIGH (ref 11.5–15.5)
WBC: 22.2 10*3/uL — ABNORMAL HIGH (ref 4.0–10.5)
nRBC: 0 % (ref 0.0–0.2)

## 2023-08-16 LAB — COMPREHENSIVE METABOLIC PANEL WITH GFR
ALT: 29 U/L (ref 0–44)
AST: 41 U/L (ref 15–41)
Albumin: 3.8 g/dL (ref 3.5–5.0)
Alkaline Phosphatase: 46 U/L (ref 38–126)
Anion gap: 19 — ABNORMAL HIGH (ref 5–15)
BUN: 18 mg/dL (ref 6–20)
CO2: 18 mmol/L — ABNORMAL LOW (ref 22–32)
Calcium: 8.8 mg/dL — ABNORMAL LOW (ref 8.9–10.3)
Chloride: 100 mmol/L (ref 98–111)
Creatinine, Ser: 1.24 mg/dL (ref 0.61–1.24)
GFR, Estimated: >60 mL/min (ref >60)
Glucose, Bld: 60 mg/dL — ABNORMAL LOW (ref 70–99)
Potassium: 3.5 mmol/L (ref 3.5–5.1)
Sodium: 137 mmol/L (ref 135–145)
Total Bilirubin: 1 mg/dL (ref 0.0–1.2)
Total Protein: 7.2 g/dL (ref 6.5–8.1)

## 2023-08-16 LAB — URINALYSIS, W/ REFLEX TO CULTURE (INFECTION SUSPECTED)
Bacteria, UA: NONE SEEN
Bilirubin Urine: NEGATIVE
Glucose, UA: NEGATIVE mg/dL
Ketones, ur: NEGATIVE mg/dL
Leukocytes,Ua: NEGATIVE
Nitrite: NEGATIVE
Protein, ur: NEGATIVE mg/dL
Specific Gravity, Urine: 1.01 (ref 1.005–1.030)
pH: 5 (ref 5.0–8.0)

## 2023-08-16 LAB — PROTIME-INR
INR: 1.2 (ref 0.8–1.2)
Prothrombin Time: 15.5 s — ABNORMAL HIGH (ref 11.4–15.2)

## 2023-08-16 LAB — CBG MONITORING, ED: Glucose-Capillary: 90 mg/dL (ref 70–99)

## 2023-08-16 LAB — TYPE AND SCREEN
ABO/RH(D): O POS
Antibody Screen: NEGATIVE

## 2023-08-16 LAB — LACTIC ACID, PLASMA
Lactic Acid, Venous: >9 mmol/L (ref 0.5–1.9)
Lactic Acid, Venous: 1.4 mmol/L (ref 0.5–1.9)

## 2023-08-16 LAB — VALPROIC ACID LEVEL: Valproic Acid Lvl: 44 ug/mL — ABNORMAL LOW (ref 50.0–100.0)

## 2023-08-16 LAB — MRSA NEXT GEN BY PCR, NASAL: MRSA by PCR Next Gen: NOT DETECTED

## 2023-08-16 LAB — APTT: aPTT: 31 s (ref 24–36)

## 2023-08-16 LAB — ETHANOL: Alcohol, Ethyl (B): <10 mg/dL (ref <10)

## 2023-08-16 MED ORDER — DIVALPROEX SODIUM 500 MG PO DR TAB
500.0000 mg | DELAYED_RELEASE_TABLET | Freq: Two times a day (BID) | ORAL | Status: DC
  Administered 2023-08-16: 500 mg via ORAL
  Filled 2023-08-16: qty 1

## 2023-08-16 MED ORDER — DEXTROSE-SODIUM CHLORIDE 5-0.45 % IV SOLN: INTRAVENOUS | Status: AC

## 2023-08-16 MED ORDER — SODIUM CHLORIDE 0.9 % IV BOLUS (SEPSIS)
3000.0000 mL | Freq: Once | INTRAVENOUS | Status: AC
  Administered 2023-08-16: 3000 mL via INTRAVENOUS

## 2023-08-16 MED ORDER — SODIUM CHLORIDE 0.9 % IV SOLN: INTRAVENOUS | Status: DC

## 2023-08-16 MED ORDER — LEVETIRACETAM 750 MG PO TABS
750.0000 mg | ORAL_TABLET | Freq: Two times a day (BID) | ORAL | Status: DC
  Administered 2023-08-16 – 2023-08-20 (×8): 750 mg via ORAL
  Filled 2023-08-16 (×9): qty 1

## 2023-08-16 MED ORDER — DEXTROSE 50 % IV SOLN: 1.0000 | Freq: Once | INTRAVENOUS | Status: DC

## 2023-08-16 MED ORDER — BENZTROPINE MESYLATE 1 MG PO TABS
1.0000 mg | ORAL_TABLET | Freq: Two times a day (BID) | ORAL | Status: DC
  Administered 2023-08-16 – 2023-08-20 (×9): 1 mg via ORAL
  Filled 2023-08-16 (×9): qty 1

## 2023-08-16 MED ORDER — PIPERACILLIN-TAZOBACTAM 3.375 G IVPB
3.3750 g | Freq: Once | INTRAVENOUS | Status: AC
Start: 2023-08-16 — End: 2023-08-16
  Administered 2023-08-16: 3.375 g via INTRAVENOUS
  Filled 2023-08-16: qty 50

## 2023-08-16 MED ORDER — LORAZEPAM 2 MG/ML IJ SOLN
INTRAMUSCULAR | Status: AC
  Administered 2023-08-16: 1 mg via INTRAVENOUS
  Filled 2023-08-16: qty 1

## 2023-08-16 MED ORDER — ONDANSETRON HCL 4 MG PO TABS
4.0000 mg | ORAL_TABLET | Freq: Four times a day (QID) | ORAL | Status: DC | PRN
  Administered 2023-08-19 – 2023-08-20 (×3): 4 mg via ORAL
  Filled 2023-08-16 (×3): qty 1

## 2023-08-16 MED ORDER — HALOPERIDOL 0.5 MG PO TABS: 0.5000 mg | ORAL_TABLET | Freq: Four times a day (QID) | ORAL | Status: DC | PRN

## 2023-08-16 MED ORDER — LORAZEPAM 2 MG/ML IJ SOLN
1.0000 mg | Freq: Once | INTRAMUSCULAR | Status: AC
  Administered 2023-08-16: 1 mg via INTRAVENOUS

## 2023-08-16 MED ORDER — DIAZEPAM 5 MG/ML IJ SOLN
2.0000 mg | Freq: Once | INTRAMUSCULAR | Status: AC
Start: 2023-08-16 — End: 2023-08-16
  Administered 2023-08-16: 2 mg via INTRAVENOUS
  Filled 2023-08-16: qty 2

## 2023-08-16 MED ORDER — LORAZEPAM 2 MG/ML IJ SOLN
2.0000 mg | Freq: Once | INTRAMUSCULAR | Status: AC
  Administered 2023-08-16: 2 mg via INTRAVENOUS
  Filled 2023-08-16: qty 1

## 2023-08-16 MED ORDER — TETANUS-DIPHTH-ACELL PERTUSSIS 5-2.5-18.5 LF-MCG/0.5 IM SUSY
0.5000 mL | PREFILLED_SYRINGE | Freq: Once | INTRAMUSCULAR | Status: AC
  Administered 2023-08-16: 0.5 mL via INTRAMUSCULAR
  Filled 2023-08-16: qty 0.5

## 2023-08-16 MED ORDER — PANTOPRAZOLE SODIUM 20 MG PO TBEC
20.0000 mg | DELAYED_RELEASE_TABLET | Freq: Every day | ORAL | Status: DC
  Administered 2023-08-16 – 2023-08-20 (×5): 20 mg via ORAL
  Filled 2023-08-16 (×5): qty 1

## 2023-08-16 MED ORDER — DIVALPROEX SODIUM 500 MG PO DR TAB
500.0000 mg | DELAYED_RELEASE_TABLET | Freq: Three times a day (TID) | ORAL | Status: DC
  Filled 2023-08-16: qty 1

## 2023-08-16 MED ORDER — LEVETIRACETAM 500 MG PO TABS
1000.0000 mg | ORAL_TABLET | Freq: Two times a day (BID) | ORAL | Status: DC
  Administered 2023-08-16: 1000 mg via ORAL
  Filled 2023-08-16: qty 2

## 2023-08-16 MED ORDER — ACETAMINOPHEN 325 MG PO TABS
650.0000 mg | ORAL_TABLET | Freq: Four times a day (QID) | ORAL | Status: DC | PRN
  Administered 2023-08-18: 650 mg via ORAL
  Filled 2023-08-16: qty 2

## 2023-08-16 MED ORDER — NICOTINE 14 MG/24HR TD PT24
14.0000 mg | MEDICATED_PATCH | Freq: Every day | TRANSDERMAL | Status: DC
  Administered 2023-08-16 – 2023-08-20 (×5): 14 mg via TRANSDERMAL
  Filled 2023-08-16 (×5): qty 1

## 2023-08-16 MED ORDER — DIVALPROEX SODIUM 500 MG PO DR TAB
750.0000 mg | DELAYED_RELEASE_TABLET | Freq: Two times a day (BID) | ORAL | Status: DC
  Administered 2023-08-16 – 2023-08-20 (×8): 750 mg via ORAL
  Filled 2023-08-16 (×9): qty 1

## 2023-08-16 MED ORDER — LORAZEPAM 2 MG/ML IJ SOLN
1.0000 mg | INTRAMUSCULAR | Status: DC | PRN
  Administered 2023-08-16 – 2023-08-18 (×7): 1 mg via INTRAVENOUS
  Filled 2023-08-16 (×7): qty 1

## 2023-08-16 MED ORDER — ENOXAPARIN SODIUM 40 MG/0.4ML IJ SOSY
40.0000 mg | PREFILLED_SYRINGE | INTRAMUSCULAR | Status: DC
  Administered 2023-08-16 – 2023-08-17 (×2): 40 mg via SUBCUTANEOUS
  Filled 2023-08-16 (×3): qty 0.4

## 2023-08-16 MED ORDER — LEVETIRACETAM IN NACL 1000 MG/100ML IV SOLN
1000.0000 mg | Freq: Once | INTRAVENOUS | Status: AC
  Administered 2023-08-16: 1000 mg via INTRAVENOUS
  Filled 2023-08-16: qty 100

## 2023-08-16 MED ORDER — PIPERACILLIN-TAZOBACTAM 3.375 G IVPB
3.3750 g | Freq: Three times a day (TID) | INTRAVENOUS | Status: DC
  Administered 2023-08-16 – 2023-08-19 (×9): 3.375 g via INTRAVENOUS
  Filled 2023-08-16 (×10): qty 50

## 2023-08-16 MED ORDER — HALOPERIDOL LACTATE 5 MG/ML IJ SOLN
5.0000 mg | Freq: Three times a day (TID) | INTRAMUSCULAR | Status: DC | PRN
  Administered 2023-08-16 – 2023-08-18 (×3): 5 mg via INTRAMUSCULAR
  Filled 2023-08-16 (×3): qty 1

## 2023-08-16 MED ORDER — LOSARTAN POTASSIUM 50 MG PO TABS
50.0000 mg | ORAL_TABLET | Freq: Every day | ORAL | Status: DC
  Administered 2023-08-16 – 2023-08-20 (×5): 50 mg via ORAL
  Filled 2023-08-16 (×5): qty 1

## 2023-08-16 MED ORDER — ONDANSETRON HCL 4 MG/2ML IJ SOLN
4.0000 mg | Freq: Four times a day (QID) | INTRAMUSCULAR | Status: DC | PRN
  Administered 2023-08-17 – 2023-08-20 (×2): 4 mg via INTRAVENOUS
  Filled 2023-08-16 (×3): qty 2

## 2023-08-16 MED ORDER — SODIUM CHLORIDE 0.9 % IV BOLUS: 1000.0000 mL | Freq: Once | INTRAVENOUS | Status: DC

## 2023-08-16 MED ORDER — ARIPIPRAZOLE ER 960 MG/3.2ML IM PRSY: 960.0000 mg | PREFILLED_SYRINGE | INTRAMUSCULAR | Status: DC

## 2023-08-16 MED ORDER — LORAZEPAM 2 MG/ML IJ SOLN: 1.0000 mg | Freq: Once | INTRAMUSCULAR | Status: AC

## 2023-08-16 MED ORDER — DEXTROSE 10 % IV SOLN: INTRAVENOUS | Status: DC

## 2023-08-16 MED ORDER — IOHEXOL 300 MG/ML  SOLN: 75.0000 mL | Freq: Once | INTRAMUSCULAR | Status: DC | PRN

## 2023-08-16 NOTE — ED Notes (Signed)
 Tele sitter requested. CN aware.

## 2023-08-16 NOTE — ED Notes (Signed)
 Red top and lactic also sent to lab.

## 2023-08-16 NOTE — ED Provider Notes (Addendum)
 [facility] Provider Note    None    (approximate)   History   Seizures  Pt to ED from sister's house via [facility] for seizure this AM, hx of same. Per family, compliant with meds  Per family, pt was at [facility] yesterday, was supposed to have "surgery on appendix" today but left AMA last night  Pt walked on scene, got on stretcher then seized for 2 minutes tonic-clonic, arms and legs. EMS gave 5mg  Versed IM, 50mL of D10 IV  EMS VS: 160/110, CBG 63 then 95 after D10, 97.8 axillary  Pt currently snoring. Has laceration above L eyebrow  Family hx [place] syndrome per EMS  EDP at bedside   HPI Luis Hunter is a 48 y.o. male with schizophrenia, seizures, bipolar disorder, polysubstance use presents for evaluation of seizure-like activity -Per EMS, family called an ambulance after patient had a seizure-like episode at home -On EMS arrival, vital signs stable, glucose 65, patient was ambulatory, speaking, slightly slow to respond but otherwise oriented.  Subsequently had a 2-minute generalized tonic-clonic seizure, given 5 mg IM Versed as well as dextrose.  Repeat glucose 95. -Patient is a noncontributory historian on my eval, moving all extremities, protecting airway  Per chart review, was seen in emergency department yesterday evening abdominal pain.  Laboratory workup was reassuring the patient did have a CT showing evidence of acute appendicitis.  Given Zosyn.  General surgery was consulted and evaluated patient but patient then decided to leave AGAINST MEDICAL ADVICE.  Patient does have a neurology clinic note from 03/19/2023 which I personally reviewed.  72-hour EEG unremarkable at that time.  Decrease Depakote to 250 mg twice daily, decrease Keppra to 750 mg nightly, continued Cogentin to 0.25 mg nightly.  Recommend follow-up in 4-6 weeks.  Last CT head in chart records on 04/13/2023, unremarkable.     Physical Exam   Triage Vital Signs: BP 95/69    Pulse 89   Temp 97.9 F (36.6 C) (Axillary)   Resp (!) 21   Wt 92.5 kg   SpO2 99%   BMI 28.45 kg/m     General: Sleepy but arousable to loud voice and noxious stimuli, appears postictal Head:  Does have a small 0.5 cm superficial laceration just superior and lateral to eyebrow CV:  Good peripheral perfusion. RRR, RP 2+ Resp:  Normal effort. CTAB Abd:  No distention.  No obvious tenderness to palpation throughout though patient is notably altered Neuro:  Moving all extremities spontaneously, pupils equal and reactive to light otherwise not interactive with neurologic exam   ED Results / Procedures / Treatments   Labs (all labs ordered are listed, but only abnormal results are displayed) Labs Reviewed  CBC WITH DIFFERENTIAL/PLATELET - Abnormal; Notable for the following components:      Result Value   WBC 22.2 (*)    RDW 16.7 (*)    Platelets 109 (*)    Neutro Abs 19.3 (*)    Monocytes Absolute 1.7 (*)    Abs Immature Granulocytes 0.19 (*)    All other components within normal limits  COMPREHENSIVE METABOLIC PANEL WITH GFR - Abnormal; Notable for the following components:   CO2 18 (*)    Glucose, Bld 60 (*)    Calcium 8.8 (*)    Anion gap 19 (*)    All other components within normal limits  VALPROIC ACID LEVEL - Abnormal; Notable for the following components:   Valproic Acid Lvl 44 (*)  All other components within normal limits  BLOOD GAS, VENOUS - Abnormal; Notable for the following components:   Acid-base deficit 2.6 (*)    All other components within normal limits  PROTIME-INR - Abnormal; Notable for the following components:   Prothrombin Time 15.5 (*)    All other components within normal limits  CULTURE, BLOOD (ROUTINE X 2)  CULTURE, BLOOD (ROUTINE X 2)  ETHANOL  APTT  URINE DRUG SCREEN, QUALITATIVE ([facility] ONLY)  LACTIC ACID, PLASMA  LACTIC ACID, PLASMA  URINALYSIS, W/ REFLEX TO CULTURE (INFECTION SUSPECTED)  CBG MONITORING, ED  CBG MONITORING, ED  TYPE  AND SCREEN     EKG   RADIOLOGY Imaging interpreted by myself and radiology reports reviewed. See ED course below  PROCEDURES:  Critical Care performed: Yes, see critical care procedure note(s)  .Critical Care  Performed by: Collis Deaner, MD Authorized by: Collis Deaner, MD   Critical care provider statement:    Critical care time (minutes):  45   Critical care was necessary to treat or prevent imminent or life-threatening deterioration of the following conditions:  Shock   Critical care was time spent personally by me on the following activities:  Development of treatment plan with patient or surrogate, discussions with consultants, evaluation of patient's response to treatment, examination of patient, ordering and review of laboratory studies, ordering and review of radiographic studies, ordering and performing treatments and interventions, pulse oximetry, re-evaluation of patient's condition and review of old charts   I assumed direction of critical care for this patient from another provider in my specialty: no     Care discussed with: admitting provider      MEDICATIONS ORDERED IN ED: Medications  iohexol (OMNIPAQUE) 300 MG/ML solution 75 mL (has no administration in time range)  dextrose 10 % infusion ( Intravenous New Bag/Given 08/16/23 0822)  piperacillin-tazobactam (ZOSYN) IVPB 3.375 g (0 g Intravenous Stopped 08/16/23 0821)  sodium chloride 0.9 % bolus 3,000 mL (0 mLs Intravenous Stopped 08/16/23 0819)  Tdap (BOOSTRIX) injection 0.5 mL (0.5 mLs Intramuscular Given 08/16/23 0815)  LORazepam (ATIVAN) injection 1 mg (1 mg Intravenous Given 08/16/23 0749)  levETIRAcetam (KEPPRA) IVPB 1000 mg/100 mL premix (0 mg Intravenous Stopped 08/16/23 0819)  LORazepam (ATIVAN) injection 1 mg (1 mg Intravenous Given 08/16/23 0754)  LORazepam (ATIVAN) injection 2 mg (2 mg Intravenous Given 08/16/23 0802)  diazepam (VALIUM) injection 2 mg (2 mg Intravenous Given 08/16/23 0811)      IMPRESSION / MDM / ASSESSMENT AND PLAN / ED COURSE  I reviewed the triage vital signs and the nursing notes.                              DDX/MDM/AP: Differential diagnosis includes, but is not limited to, breakthrough seizure in this patient with a known seizure disorder, consider concomitant head trauma given small lacerations to head.  Fortunately laceration is less than 1/2 cm, superficial, not requiring any repair.  Consider lower seizure threshold in setting of known underlying acute appendicitis.  Doubt other underlying infectious etiology at this time.  Consider electrolyte abnormality less likely given unremarkable labs about 12 hours ago.  Not clearly in status epilepticus at this time though we will continue to monitor closely.  Plan: -Labs -Ativan 2 mg IV as needed for recurrent seizure -Keppra load 1000 mg IV -Careful airway monitoring -30 cc/kg bolus -Zosyn -CT head -Tdap as needed -Anticipate admission  Patient's presentation is most consistent  with acute presentation with potential threat to life or bodily function.  The patient is on the cardiac monitor to evaluate for evidence of arrhythmia and/or significant heart rate changes.  ED course below.  Workup notable for significant leukocytosis, in the setting of hypotension must consider secondary to septic shock versus possible stress response in the setting of seizure.  Discussed with on-call surgeon Dr. Rosea Conch who is already familiar with patient from yesterday evening, no plan for OR at this time, recommends treat with antibiotics, admit to medicine service given concern for sepsis and possibility of recurrent seizures/postictal state.  Was previously able to get in touch with patient's sister, closest next of kin, does consent to surgical intervention if needed, patient with no decision-making capacity at this time.  Patient did require serial rounds of sedation due to agitation/confusion in his postictal state.   Blood pressures fortunately normalized with >30 cc/kg bolus.  Mild recurrent hypoglycemia to 60, started on D10 drip.  Patient did ultimately respond well to serial rounds of benzodiazepines (5 mg IM versed by EMS, 4 mg Ativan and 2 mg Versed here in the emergency department.)  Fortunately adequately protecting airway, no further seizures, was able to avoid intubation at time of writing but will continue to monitor closely.  Admitted to medicine service for septic shock and AMS thought to be postictal state in the setting of recent seizure, no evidence of status epilepticus at this time.  Blood pressure improved with IV fluids and antibiotics.  Clinical Course as of 08/16/23 1610  Sat Aug 16, 2023  0726 Patient not found to be hypotensive, adding 30 cc/kg bolus [MM]  0737 Last Tdap 2020 per chart review, will update today [MM]  0738 Called listed contact to discuss,  Jewel,Arriane (Sister) [phone] [facility])  No answer [MM]  [phone] D/w Dr. Rosea Conch of general surgery Aware of patient, plan for IV antibiotics, no occasion for emergent ER States repeat CT abdomen pelvis would not change management at this point  Recommends hospitalist or ICU admission prn, general surgery will consult [MM]  [phone] CBC with new notable leukocytosis and left shift concerning for worsened infection, also consider stress response from seizure [MM]  0753 BMP with mild hypoglycemia, will start D10 [MM]  0809 Intermittently agitated and confused.  Pulling at lines, requiring multiple providers to physically restrain in addition to chemical restraint.  Received 5 mg IM Versed by EMS and has now received 4 mg of IV Ativan and 2 mg of IV Versed here.  Currently resting calmly, fortunately able to defer intubation at this point and is currently protecting airway.  Will attempt to get CT head and proceed with IV antibiotics and admission.  No obvious tonic-clonic movement, appears primarily to be postictal as opposed to be in  status epilepticus.  However, low threshold to escalate to intubation for airway protection if any recurrent seizures or ongoing agitation despite aggressive sedation attempts in order to facilitate care in this critically ill patient.  Fortunately blood pressure has improved with IV fluids and antibiotics, maps currently in the upper 80s. [MM]  0816 Chest x-ray reviewed, no obvious pathology on my interpretation [MM]  0819 Sepsis reevaluation performed, perfusion improved, blood pressure normalized. [MM]  [phone] Chest x-ray radiology report reviewed, in agreement [MM]  [phone] CT head with no acute pathology on my interpretation  Hospitalist consult order placed [MM]    Clinical Course User Index [MM] Collis Deaner, MD     FINAL CLINICAL IMPRESSION(S) / ED DIAGNOSES  Final diagnoses:  Acute appendicitis, unspecified acute appendicitis type  Septic shock (HCC)  Altered mental status, unspecified altered mental status type     Rx / DC Orders   ED Discharge Orders     None        Note:  This document was prepared using Dragon voice recognition software and may include unintentional dictation errors.   Collis Deaner, MD 08/16/23 [id number]    Collis Deaner, MD 08/16/23 6213    Collis Deaner, MD 08/16/23 0865    Collis Deaner, MD 08/16/23 [phone]

## 2023-08-16 NOTE — ED Notes (Signed)
 In CT, beginning scans. Remains calm/ sedated, intermittently mildly restless. Airway patent, VSS.

## 2023-08-16 NOTE — ED Notes (Signed)
 Patient denies pain and is resting comfortably.

## 2023-08-16 NOTE — ED Notes (Signed)
 Pt to CT

## 2023-08-16 NOTE — Assessment & Plan Note (Signed)
 On losartan

## 2023-08-16 NOTE — ED Notes (Signed)
 Bed alarm going off, fall averted, pt repositioned, given blanket, updated, reoriented.

## 2023-08-16 NOTE — ED Notes (Signed)
 EDP called to bedside again, reevaluating pt.

## 2023-08-16 NOTE — ED Notes (Addendum)
 VSS. Lactic previously sent with initial labs did not process or result. Will send another sample.

## 2023-08-16 NOTE — ED Notes (Addendum)
 Back to room from CT, no changes, remains on 2L . VSS. Airway patent. Intermittent congested cough. EDP at [facility]. HOB 30 degrees. Z pads in place. No obvious sz activity since arrival.

## 2023-08-16 NOTE — ED Triage Notes (Signed)
 Pt to ED from sister's house via [facility] for seizure this AM, hx of same. Per family, compliant with meds  Per family, pt was at [facility] yesterday, was supposed to have "surgery on appendix" today but left AMA last night  Pt walked on scene, got on stretcher then seized for 2 minutes tonic-clonic, arms and legs. EMS gave 5mg  Versed IM, 50mL of D10 IV  EMS VS: 160/110, CBG 63 then 95 after D10, 97.8 axillary  Pt currently snoring. Has laceration above L eyebrow  Family hx [place] syndrome per EMS  EDP at bedside

## 2023-08-16 NOTE — ED Notes (Incomplete)
 Offered urinal, declined. Asked for phone, deferred (not available). Resting comfortably. Remains sleepy/ somnolent, arousable and interactive. Poor concentration.

## 2023-08-16 NOTE — Assessment & Plan Note (Signed)
 Noted recent diagnosis of appendicitis with recent surgical evaluation Patient left hospital AGAINST MEDICAL ADVICE Will reconsult surgery for formal recommendations Monitor

## 2023-08-16 NOTE — Progress Notes (Signed)
 Declined admission for acute appendicitis  48 year old male with history of seizure disorder, polysubstance abuse, schizo affective disorder with bipolar who presents with a several week history of  abdominal pain, mostly epigastric radiating to the chest that acutely worsened on the day of arrival.  He had 8 episodes of nonbloody nonbilious emesis.  Denies diarrhea, dysuria, cough fever or chills. ED course and data review: Tmax 99, mildly tachypneic 96-102, SBP 129-135. CBC WNL Lipase normal, LFTs not done Troponin 8 BMP notable for potassium of 3.4 EKG showed sinus tachycardia at 101 with nonspecific T wave abnormalities Chest x-ray clear CT abdomen and pelvis showing acute appendicitis without perforation and appendix in a typical location centrally within the pelvis lower abdomen.  Patient started on Zosyn, Protonix and Pepcid and given Zosyn  Spoke with   Hospitalist consulted for admission.  Discussed with surgeon on phone, Dr. Rosea Conch who subsequently agreed to accept and consult hospitalist service if needed.  Patient however patient decided to sign out AMA following surgical eval patient.  Patient was not seen by hospitalist service prior to signing out AMA.  Please refer to surgical consultation for details.

## 2023-08-16 NOTE — ED Notes (Addendum)
 Admitting MD Dr. Daisey Dryer into room, at [facility].

## 2023-08-16 NOTE — Assessment & Plan Note (Addendum)
 [place] Syndrome Witnessed breakthrough seizure with persistence of symptoms on presentation to the ER Status post IV Keppra load Continue home Keppra and increase Depakote dose. Appreciate neurology consultation.

## 2023-08-16 NOTE — Assessment & Plan Note (Signed)
 Transient lethargy with noted positive will stay status post witnessed seizure Status post Valium, Ativan and Keppra load No overt infection noted present Monitor

## 2023-08-16 NOTE — ED Notes (Signed)
 Preparing to go to CT1, IVF bolus complete, zosyn complete, keppra complete. VSS, airway patent, calmer, less restless. EDP at [facility].

## 2023-08-16 NOTE — ED Notes (Signed)
 Bed alarm going off, pt sitting at edge of bed, oriented with some confusion. Asking about time, and using phone. Poor concentration. Updated, returned to stretcher, reclined, positioned for sleep, given warm blanket, agreeable, and cooperative.

## 2023-08-16 NOTE — H&P (Addendum)
 History and Physical    Patient: Luis Hunter AVW:[id number] DOB: [birth year] DOA: 08/16/2023 DOS: the patient was seen and examined on 08/16/2023 PCP: [facility]  Patient coming from: Home  Chief Complaint:  Chief Complaint  Patient presents with   Seizures   HPI: [name] is a 48 y.o. male with medical history significant of seizure disorder, however per syndrome, bipolar disorder, hypertension presenting with seizure, appendicitis.  Limited history in setting of postictal state status post benzo use.  Patient noted to have been evaluated yesterday for right sided abdominal pain.  Formally diagnosed with appendicitis.  General surgery consulted with recommendations including suregery. However, patient was only agreeable to antibiotics and left the hospital AGAINST MEDICAL ADVICE.  Per report, patient with witnessed GTC seizure at home by family overnight. Baseline history of chronic seizure.  No reported recent medication changes.  No reported illicit drug use. Presented to the ER afebrile, hemodynamically stable.  Satting well on room air.  Required multiple doses of Ativan as well as Valium to help with seizure event.  Status post IV Keppra load. Review of Systems: unable to review all systems due to the inability of the patient to answer questions. Past Medical History:  Diagnosis Date   Bipolar disorder (HCC)    Depression    Schizophrenia (HCC)    Seizures (HCC)    Past Surgical History:  Procedure Laterality Date   FINGER SURGERY     IR GASTROSTOMY TUBE MOD SED  01/16/2021   LEFT HEART CATH AND CORONARY ANGIOGRAPHY N/A 12/25/2020   Procedure: LEFT HEART CATH AND CORONARY ANGIOGRAPHY;  Surgeon: Sammy Crisp, MD;  Location: [facility] INVASIVE CV LAB;  Service: Cardiovascular;  Laterality: N/A;   Social History:  reports that he has been smoking. He has never used smokeless tobacco. He reports current drug use. Drugs: Marijuana and Cocaine. He reports that  he does not drink alcohol.  Allergies  Allergen Reactions   Lacosamide     EOSINOPHILIA   Lacosamide Other (See Comments)    Eosinophilia   Valproic Acid And Related     hyperammonemia   Valproic Acid And Related Other (See Comments)    hyperannonemia   Lamictal [Lamotrigine] Rash   Mirtazapine Rash    History reviewed. No pertinent family history.  Prior to Admission medications   Medication Sig Start Date End Date Taking? Authorizing Provider  ARIPiprazole ER 960 MG/3.2ML PRSY Inject 960 mg into the muscle every 28 (twenty-eight) days.   Yes [provider]  benztropine (COGENTIN) 0.5 MG tablet Take 2 tablets (1 mg total) by mouth 2 (two) times daily. 11/22/22 11/22/23 Yes Danford, Willis Harter, MD  divalproex (DEPAKOTE) 500 MG DR tablet Take 500 mg by mouth 2 (two) times daily.   Yes [provider]  lansoprazole (PREVACID) 30 MG capsule Take 1 capsule (30 mg total) by mouth daily. 11/22/22 11/22/23 Yes Danford, Willis Harter, MD  levETIRAcetam (KEPPRA) 750 MG tablet Take 750 mg by mouth 2 (two) times daily.   Yes [provider]  losartan (COZAAR) 50 MG tablet Take 1 tablet (50 mg total) by mouth daily. 11/22/22  Yes Danford, Willis Harter, MD  Capsaicin 0.1 % CREA Apply cream to upper abdomen as needed for nausea and vomiting.  Can use up to 4 times a day. Patient not taking: Reported on 08/15/2023 07/01/23   Viviano Ground, MD  Cholecalciferol (D-1000) 25 MCG (1000 UT) tablet Take 1 tablet (1,000 Units total) by mouth daily.  Patient not taking: Reported on 08/16/2023 11/22/22   Ephriam Hashimoto, MD  famotidine (PEPCID) 20 MG tablet Take 1 tablet (20 mg total) by mouth 2 (two) times daily. 07/01/23 08/15/23  Viviano Ground, MD  triamcinolone cream (KENALOG) 0.5 % Apply 1 Application topically 2 (two) times daily. Patient not taking: Reported on 08/15/2023 08/13/23   [provider]    Physical Exam: Vitals:   08/16/23 1225 08/16/23 1230 08/16/23  1235 08/16/23 1245  BP:  (!) 111/93    Pulse: 88 87 93 82  Resp: 12 14 (!) 22 (!) 22  Temp:      TempSrc:      SpO2: 100% 99% 98% 98%  Weight:       Physical Exam Constitutional:      Appearance: He is normal weight.     Comments: Lethargic/sleepy at the bedside   HENT:     Head: Normocephalic and atraumatic.     Nose: Nose normal.  Eyes:     Pupils: Pupils are equal, round, and reactive to light.  Cardiovascular:     Rate and Rhythm: Normal rate and regular rhythm.  Pulmonary:     Effort: Pulmonary effort is normal.  Abdominal:     General: Bowel sounds are normal.  Musculoskeletal:     Cervical back: Normal range of motion.  Skin:    General: Skin is warm.  Neurological:     General: No focal deficit present.  Psychiatric:        Mood and Affect: Mood normal.     Data Reviewed:  There are no new results to review at this time.  CT HEAD WO CONTRAST ( ) CLINICAL DATA:  Seizure and head trauma  EXAM: CT HEAD WITHOUT CONTRAST  TECHNIQUE: Contiguous axial images were obtained from the base of the skull through the vertex without intravenous contrast.  RADIATION DOSE REDUCTION: This exam was performed according to the departmental dose-optimization program which includes automated exposure control, adjustment of the mA and/or kV according to patient size and/or use of iterative reconstruction technique.  COMPARISON:  04/13/2023  FINDINGS: Brain: No evidence of acute infarction, hemorrhage, hydrocephalus, extra-axial collection or mass lesion/mass effect.  Vascular: No hyperdense vessel or unexpected calcification.  Skull: Normal. Negative for fracture or focal lesion.  Sinuses/Orbits: No acute finding.  IMPRESSION: No evidence of intracranial injury.  Stable head CT.  Electronically Signed   By: Ronnette Coke M.D.   On: 08/16/2023 08:59 DG Chest Port 1 View CLINICAL DATA:  Questionable sepsis.  EXAM: PORTABLE CHEST 1 VIEW  COMPARISON:   08/15/2023  FINDINGS: The lungs are clear without focal pneumonia, edema, pneumothorax or pleural effusion. The cardiopericardial silhouette is within normal limits for size. No acute bony abnormality. Telemetry leads overlie the chest.  IMPRESSION: No active disease.  Electronically Signed   By: Donnal Fusi M.D.   On: 08/16/2023 08:32  Lab Results  Component Value Date   WBC 22.2 (H) 08/16/2023   HGB 14.5 08/16/2023   HCT 45.8 08/16/2023   MCV 93.3 08/16/2023   PLT 109 (L) 08/16/2023   Last metabolic panel Lab Results  Component Value Date   GLUCOSE 60 (L) 08/16/2023   NA 137 08/16/2023   K 3.5 08/16/2023   CL 100 08/16/2023   CO2 18 (L) 08/16/2023   BUN 18 08/16/2023   CREATININE 1.24 08/16/2023   GFRNONAA >60 08/16/2023   CALCIUM 8.8 (L) 08/16/2023   PHOS 2.6 11/18/2022   PROT 7.2 08/16/2023  ALBUMIN 3.8 08/16/2023   BILITOT 1.0 08/16/2023   ALKPHOS 46 08/16/2023   AST 41 08/16/2023   ALT 29 08/16/2023   ANIONGAP 19 (H) 08/16/2023    Assessment and Plan: * Seizure (HCC) [place] Syndrome Witnessed breakthrough seizure with persistence of symptoms on presentation to the ER Noted history of [place] Syndrome Status post IV Keppra load Continue home medications including Depakote and Keppra Noted subtherapeutic Depakote level-increase Depakote to 750mg  BID (discussed w/ Dr. Doretta Gant)  as needed IV Ativan Noted prior hx/o status epilepticus requiring intubation  Low threshold for neuro involvement if recur/persist  Monitor  Appendicitis Noted recent diagnosis of appendicitis with recent surgical evaluation Patient left hospital AGAINST MEDICAL ADVICE Will reconsult surgery for formal recommendations Monitor  Encephalopathy acute Transient lethargy with noted positive will stay status post witnessed seizure Status post Valium, Ativan and Keppra load No overt infection noted present Monitor  Essential hypertension BP stable  Titrate home regimen     Polysubstance abuse (HCC) Urine drug screen pending  Schizophrenia (HCC) Cont home regimen including abilify and cogentin       Advance Care Planning:   Code Status: Full Code   Consults: General surgery   Family Communication: No family at the bedside   Severity of Illness: The appropriate patient status for this patient is OBSERVATION. Observation status is judged to be reasonable and necessary in order to provide the required intensity of service to ensure the patient's safety. The patient's presenting symptoms, physical exam findings, and initial radiographic and laboratory data in the context of their medical condition is felt to place them at decreased risk for further clinical deterioration. Furthermore, it is anticipated that the patient will be medically stable for discharge from the hospital within 2 midnights of admission.   Author: Corrinne Din, MD 08/16/2023 1:04 PM  For on call review [email or web address].

## 2023-08-16 NOTE — ED Notes (Signed)
 Med rec tech at [facility]. Given phone. Bed alarm activated.

## 2023-08-16 NOTE — ED Notes (Signed)
 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 Intermittent bed alarm frequently.

## 2023-08-16 NOTE — Consult Note (Signed)
 Subjective:   CC: Acute appendicitis  HPI:  Luis Hunter is a 48 y.o. male who is consulted by [facility] for evaluation of  above cc.    Previously seen on last admission last night for same issue.  Patient at that time decided to leave hospital AGAINST MEDICAL ADVICE.  Returns today for new onset seizures currently being treated by primary.      Past Medical History:  has a past medical history of Bipolar disorder (HCC), Depression, Schizophrenia (HCC), and Seizures (HCC).  Past Surgical History:  has a past surgical history that includes Finger surgery; LEFT HEART CATH AND CORONARY ANGIOGRAPHY (N/A, 12/25/2020); and IR GASTROSTOMY TUBE MOD SED (01/16/2021).  Family History: family history is not on file.  Social History:  reports that he has been smoking. He has never used smokeless tobacco. He reports current drug use. Drugs: Marijuana and Cocaine. He reports that he does not drink alcohol.  Current Medications:  Prior to Admission medications   Medication Sig Start Date End Date Taking? Authorizing Provider  ARIPiprazole ER 960 MG/3.2ML PRSY Inject 960 mg into the muscle every 28 (twenty-eight) days.   Yes [provider]  benztropine (COGENTIN) 0.5 MG tablet Take 2 tablets (1 mg total) by mouth 2 (two) times daily. 11/22/22 11/22/23 Yes Danford, Willis Harter, MD  divalproex (DEPAKOTE) 500 MG DR tablet Take 500 mg by mouth 2 (two) times daily.   Yes [provider]  lansoprazole (PREVACID) 30 MG capsule Take 1 capsule (30 mg total) by mouth daily. 11/22/22 11/22/23 Yes Danford, Willis Harter, MD  levETIRAcetam (KEPPRA) 750 MG tablet Take 750 mg by mouth 2 (two) times daily.   Yes [provider]  losartan (COZAAR) 50 MG tablet Take 1 tablet (50 mg total) by mouth daily. 11/22/22  Yes Danford, Willis Harter, MD  Capsaicin 0.1 % CREA Apply cream to upper abdomen as needed for nausea and vomiting.  Can use up to 4 times a day. Patient not taking: Reported on 08/15/2023  07/01/23   Viviano Ground, MD  Cholecalciferol (D-1000) 25 MCG (1000 UT) tablet Take 1 tablet (1,000 Units total) by mouth daily. Patient not taking: Reported on 08/16/2023 11/22/22   Ephriam Hashimoto, MD  famotidine (PEPCID) 20 MG tablet Take 1 tablet (20 mg total) by mouth 2 (two) times daily. 07/01/23 08/15/23  Viviano Ground, MD  triamcinolone cream (KENALOG) 0.5 % Apply 1 Application topically 2 (two) times daily. Patient not taking: Reported on 08/15/2023 08/13/23   [provider]    Allergies:  Allergies as of 08/16/2023 - Review Complete 08/16/2023  Allergen Reaction Noted   Lacosamide  04/26/2021   Lacosamide Other (See Comments) 08/11/2021   Valproic acid and related  04/27/2021   Valproic acid and related Other (See Comments) 08/11/2021   Lamictal [lamotrigine] Rash 06/05/2021   Mirtazapine Rash 03/19/2023    ROS:  General: Denies weight loss, weight gain, fatigue, fevers, chills, and night sweats. Eyes: Denies blurry vision, double vision, eye pain, itchy eyes, and tearing. Ears: Denies hearing loss, earache, and ringing in ears. Nose: Denies sinus pain, congestion, infections, runny nose, and nosebleeds. Mouth/throat: Denies hoarseness, sore throat, bleeding gums, and difficulty swallowing. Heart: Denies chest pain, palpitations, racing heart, irregular heartbeat, leg pain or swelling, and decreased activity tolerance. Respiratory: Denies breathing difficulty, shortness of breath, wheezing, cough, and sputum. GI: Denies change in appetite, heartburn, nausea, vomiting, constipation, diarrhea, and blood in stool. GU: Denies difficulty urinating, pain with urinating, urgency, frequency, blood in  urine. Musculoskeletal: Denies joint stiffness, pain, swelling, muscle weakness. Skin: Denies rash, itching, mass, tumors, sores, and boils Neurologic: Denies headache, fainting, dizziness, seizures, numbness, and tingling. Psychiatric: Denies depression, anxiety, difficulty  sleeping, and memory loss. Endocrine: Denies heat or cold intolerance, and increased thirst or urination. Blood/lymph: Denies easy bruising, easy bruising, and swollen glands     Objective:     BP (!) 127/91 (BP Location: Left Arm)   Pulse 88   Temp 97.9 F (36.6 C) (Oral)   Resp (!) 26   Ht 5\' 11"  (1.803 m)   Wt 86.6 kg   SpO2 100%   BMI 26.63 kg/m    Constitutional :  alert, cooperative, appears stated age, and no distress  Lymphatics/Throat:  no asymmetry, masses, or scars  Respiratory:  clear to auscultation bilaterally  Cardiovascular:  regular rate and rhythm  Gastrointestinal: soft, non-tender; bowel sounds normal; no masses,  no organomegaly.   Musculoskeletal: Steady gait and movement  Skin: Cool and moist  Psychiatric: Normal affect, non-agitated, not confused       LABS:     Latest Ref Rng & Units 08/16/2023    7:19 AM 08/15/2023    9:07 PM 08/15/2023    3:31 PM  CMP  Glucose 70 - 99 mg/dL 60   161   BUN 6 - 20 mg/dL 18   12   Creatinine 0.96 - 1.24 mg/dL 0.45   4.09   Sodium 811 - 145 mmol/L 137   140   Potassium 3.5 - 5.1 mmol/L 3.5   3.4   Chloride 98 - 111 mmol/L 100   107   CO2 22 - 32 mmol/L 18   22   Calcium 8.9 - 10.3 mg/dL 8.8   9.2   Total Protein 6.5 - 8.1 g/dL 7.2  6.7    Total Bilirubin 0.0 - 1.2 mg/dL 1.0  1.0    Alkaline Phos 38 - 126 U/L 46  40    AST 15 - 41 U/L 41  50    ALT 0 - 44 U/L 29  31        Latest Ref Rng & Units 08/16/2023    7:19 AM 08/15/2023    3:31 PM 07/01/2023    5:04 PM  CBC  WBC 4.0 - 10.5 K/uL 22.2  10.4  13.6   Hemoglobin 13.0 - 17.0 g/dL 91.4  78.2  95.6   Hematocrit 39.0 - 52.0 % 45.8  42.3  43.9   Platelets 150 - 400 K/uL 109  112  191      RADS: N/a  Assessment:      Acute appendicitis  Plan:     Patient fortunately has 0 abdominal complaints at time of exam today.  With the recurrent seizure issues, proceeding with any surgical intervention at this point will be to high risk.  With symptom  improvement, recommend continuing IV antibiotic therapies for the appendicitis for now.  Patient can follow-up with surgery as an outpatient once he is deemed ready for discharge.  Please call surgery for any worsening or recurrent abdominal symptoms, or if there is concern that recurrent seizures may potentially be from the infectious stress from chronic appendicitis.   The patient understands. all questions were answered to the patient's satisfaction.  labs/images/medications/previous chart entries reviewed personally and relevant changes/updates noted above.

## 2023-08-16 NOTE — Assessment & Plan Note (Signed)
 Urine drug screen positive for benzodiazepine and cannabis.  Cannabis can increase risk of seizure.  Likely received benzodiazepine in ER prior to urine toxicology being sent off.

## 2023-08-16 NOTE — ED Notes (Signed)
 Sleeping/ resting/ sedated, no changes, no sz activity, VSS.

## 2023-08-16 NOTE — ED Notes (Addendum)
 Pt off monitor, standing at [facility], unsteady, returned to stretcher, cooperative, interactive, speech clear, follows commands, oriented with some confusion, knows where he is at, and what they wanted to do yesterday (take appendix out). Mittens removed. Returned to monitor, VS remain stable. Dextrose continues infusing. Given warm blankets. Urinal x2 at [facility]. Pt updated. CBIR.SR up x2. Sz pads in place. Pt denies pain, nausea, HA, or sob. Bed alarm activated. Non skid socks in place. Fall risk sign present. Verbalizes understanding of not getting up. EDP, admitting MD, CN and surrounding staff notified.

## 2023-08-16 NOTE — ED Notes (Signed)
 Bed alarm going off, averted injury, prevented fall, had to use urinal, assisted at [facility], poor coordination, poorly used urinal, sample obtained. Somnolent, cooperative, forgetful.

## 2023-08-16 NOTE — Progress Notes (Signed)
 Bed alarm going off. NT found pt standing at doorway naked with urine on floor and monitoring equipment pulled off. Placed back in bed and monitoring/ gown re-applied. Pt talking out of his head. Follows commands but forgets what you tell him almost immediately.

## 2023-08-16 NOTE — ED Notes (Signed)
 Advised nurse that patient has ready bed

## 2023-08-16 NOTE — Assessment & Plan Note (Signed)
 Continue home regimen including abilify and cogentin .  Will get psychiatric consultation.

## 2023-08-17 DIAGNOSIS — F17200 Nicotine dependence, unspecified, uncomplicated: Secondary | ICD-10-CM | POA: Diagnosis present

## 2023-08-17 DIAGNOSIS — M25511 Pain in right shoulder: Secondary | ICD-10-CM | POA: Diagnosis not present

## 2023-08-17 DIAGNOSIS — I10 Essential (primary) hypertension: Secondary | ICD-10-CM | POA: Diagnosis present

## 2023-08-17 DIAGNOSIS — F2089 Other schizophrenia: Secondary | ICD-10-CM | POA: Diagnosis not present

## 2023-08-17 DIAGNOSIS — K3589 Other acute appendicitis without perforation or gangrene: Secondary | ICD-10-CM | POA: Diagnosis not present

## 2023-08-17 DIAGNOSIS — G9341 Metabolic encephalopathy: Secondary | ICD-10-CM | POA: Diagnosis not present

## 2023-08-17 DIAGNOSIS — K3580 Unspecified acute appendicitis: Secondary | ICD-10-CM | POA: Diagnosis not present

## 2023-08-17 DIAGNOSIS — Z91410 Personal history of adult physical and sexual abuse: Secondary | ICD-10-CM | POA: Diagnosis not present

## 2023-08-17 DIAGNOSIS — R441 Visual hallucinations: Secondary | ICD-10-CM | POA: Insufficient documentation

## 2023-08-17 DIAGNOSIS — F259 Schizoaffective disorder, unspecified: Secondary | ICD-10-CM | POA: Diagnosis present

## 2023-08-17 DIAGNOSIS — Z8659 Personal history of other mental and behavioral disorders: Secondary | ICD-10-CM

## 2023-08-17 DIAGNOSIS — F319 Bipolar disorder, unspecified: Secondary | ICD-10-CM | POA: Diagnosis present

## 2023-08-17 DIAGNOSIS — G40409 Other generalized epilepsy and epileptic syndromes, not intractable, without status epilepticus: Secondary | ICD-10-CM | POA: Diagnosis present

## 2023-08-17 DIAGNOSIS — W06XXXA Fall from bed, initial encounter: Secondary | ICD-10-CM | POA: Diagnosis not present

## 2023-08-17 DIAGNOSIS — Z8489 Family history of other specified conditions: Secondary | ICD-10-CM | POA: Diagnosis not present

## 2023-08-17 DIAGNOSIS — Z79899 Other long term (current) drug therapy: Secondary | ICD-10-CM | POA: Diagnosis not present

## 2023-08-17 DIAGNOSIS — G230 Hallervorden-Spatz disease: Secondary | ICD-10-CM | POA: Diagnosis present

## 2023-08-17 DIAGNOSIS — Z23 Encounter for immunization: Secondary | ICD-10-CM | POA: Diagnosis present

## 2023-08-17 DIAGNOSIS — E872 Acidosis, unspecified: Secondary | ICD-10-CM | POA: Diagnosis present

## 2023-08-17 DIAGNOSIS — D696 Thrombocytopenia, unspecified: Secondary | ICD-10-CM

## 2023-08-17 DIAGNOSIS — R569 Unspecified convulsions: Secondary | ICD-10-CM | POA: Diagnosis not present

## 2023-08-17 DIAGNOSIS — Z046 Encounter for general psychiatric examination, requested by authority: Secondary | ICD-10-CM

## 2023-08-17 DIAGNOSIS — K36 Other appendicitis: Secondary | ICD-10-CM | POA: Diagnosis present

## 2023-08-17 DIAGNOSIS — S8002XA Contusion of left knee, initial encounter: Secondary | ICD-10-CM | POA: Diagnosis not present

## 2023-08-17 DIAGNOSIS — F209 Schizophrenia, unspecified: Secondary | ICD-10-CM | POA: Diagnosis not present

## 2023-08-17 DIAGNOSIS — F05 Delirium due to known physiological condition: Secondary | ICD-10-CM | POA: Diagnosis present

## 2023-08-17 DIAGNOSIS — Z888 Allergy status to other drugs, medicaments and biological substances status: Secondary | ICD-10-CM | POA: Diagnosis not present

## 2023-08-17 DIAGNOSIS — S0181XA Laceration without foreign body of other part of head, initial encounter: Secondary | ICD-10-CM | POA: Diagnosis present

## 2023-08-17 DIAGNOSIS — N179 Acute kidney failure, unspecified: Secondary | ICD-10-CM | POA: Diagnosis not present

## 2023-08-17 DIAGNOSIS — Y92239 Unspecified place in hospital as the place of occurrence of the external cause: Secondary | ICD-10-CM | POA: Diagnosis not present

## 2023-08-17 DIAGNOSIS — F1910 Other psychoactive substance abuse, uncomplicated: Secondary | ICD-10-CM | POA: Diagnosis present

## 2023-08-17 DIAGNOSIS — R21 Rash and other nonspecific skin eruption: Secondary | ICD-10-CM | POA: Diagnosis not present

## 2023-08-17 LAB — COMPREHENSIVE METABOLIC PANEL WITH GFR
ALT: 25 U/L (ref 0–44)
AST: 30 U/L (ref 15–41)
Albumin: 2.9 g/dL — ABNORMAL LOW (ref 3.5–5.0)
Alkaline Phosphatase: 43 U/L (ref 38–126)
Anion gap: 6 (ref 5–15)
BUN: 14 mg/dL (ref 6–20)
CO2: 25 mmol/L (ref 22–32)
Calcium: 8.1 mg/dL — ABNORMAL LOW (ref 8.9–10.3)
Chloride: 108 mmol/L (ref 98–111)
Creatinine, Ser: 0.92 mg/dL (ref 0.61–1.24)
GFR, Estimated: >60 mL/min (ref >60)
Glucose, Bld: 99 mg/dL (ref 70–99)
Potassium: 3.6 mmol/L (ref 3.5–5.1)
Sodium: 139 mmol/L (ref 135–145)
Total Bilirubin: 0.8 mg/dL (ref 0.0–1.2)
Total Protein: 5.5 g/dL — ABNORMAL LOW (ref 6.5–8.1)

## 2023-08-17 LAB — CBC
HCT: 37.4 % — ABNORMAL LOW (ref 39.0–52.0)
Hemoglobin: 12.9 g/dL — ABNORMAL LOW (ref 13.0–17.0)
MCH: 29.9 pg (ref 26.0–34.0)
MCHC: 34.5 g/dL (ref 30.0–36.0)
MCV: 86.8 fL (ref 80.0–100.0)
Platelets: 91 10*3/uL — ABNORMAL LOW (ref 150–400)
RBC: 4.31 MIL/uL (ref 4.22–5.81)
RDW: 16.5 % — ABNORMAL HIGH (ref 11.5–15.5)
WBC: 12.1 10*3/uL — ABNORMAL HIGH (ref 4.0–10.5)
nRBC: 0 % (ref 0.0–0.2)

## 2023-08-17 MED ORDER — ALUM & MAG HYDROXIDE-SIMETH 200-200-20 MG/5ML PO SUSP
30.0000 mL | ORAL | Status: DC | PRN
  Administered 2023-08-20: 30 mL via ORAL
  Filled 2023-08-17: qty 30
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
## 2023-08-17 NOTE — Progress Notes (Signed)
 Progress Note   Patient: Luis Hunter YQM:[id number] DOB: 1975-10-05 DOA: 08/16/2023     0 DOS: the patient was seen and examined on 08/17/2023   Brief hospital course: 48 y.o. male with medical history significant of seizure disorder, however per syndrome, bipolar disorder, hypertension presenting with seizure, appendicitis.  Limited history in setting of postictal state status post benzo use.  Patient noted to have been evaluated yesterday for right sided abdominal pain.  Formally diagnosed with appendicitis.  General surgery consulted with recommendations including suregery. However, patient was only agreeable to antibiotics and left the hospital AGAINST MEDICAL ADVICE.  Per report, patient with witnessed GTC seizure at home by family overnight. Baseline history of chronic seizure.  No reported recent medication changes.  No reported illicit drug use. Presented to the ER afebrile, hemodynamically stable.  Satting well on room air.  Required multiple doses of Ativan as well as Valium to help with seizure event.  Status post IV Keppra load.  4/13.  Family concerned that mental status not back to baseline.  Patient wants to leave but is willing to stay secondary to family asking him to stay.  Will get psychiatry evaluation and also neurology evaluation.  On IV Zosyn for appendicitis.  Assessment and Plan: * Seizure (HCC) [place] Syndrome Witnessed breakthrough seizure with persistence of symptoms on presentation to the ER Noted history of [place] Syndrome Status post IV Keppra load Continue home Keppra and increase Depakote dose. Patient was asking to change his dose of Keppra secondary to a rash on his back.  Case discussed with neurology to see patient to help out with medications  Appendicitis Noted recent diagnosis of appendicitis with recent surgical evaluation Patient left hospital AGAINST MEDICAL ADVICE Continue IV Zosyn for now Case discussed with Dr. Rosea Conch general surgery.  He  would need neurological clearance and psychiatric clearance on when to go to the operating room.  Acute metabolic encephalopathy Family concerned that he is not back to baseline.  Could be secondary to infection with appendicitis and/or seizure.  Continue IV antibiotics for appendicitis  Schizophrenia (HCC) Continue home regimen including abilify and cogentin .  Will get psychiatric consultation.  Polysubstance abuse (HCC) Urine drug screen positive for benzodiazepine and cannabis.  Cannabis can increase risk of seizure.  Likely received benzodiazepine in ER prior to urine toxicology being sent off.  Essential hypertension On losartan  Thrombocytopenia (HCC) Could be secondary to infection but looking back he has had previous times where his platelet counts have been low.  Will check hepatitis C.  Visual hallucinations Continue to monitor.  Could be secondary to appendicitis and infection.        Subjective: Patient wanting to go home.  Patient states his abdominal pain is less than when he came in.  Patient had hallucinations yesterday.  Admitted with seizure.  Recently signed out AMA for appendicitis.  Physical Exam: Vitals:   08/17/23 0700 08/17/23 0730 08/17/23 0748 08/17/23 1000  BP:   129/86 139/85  Pulse: 76 75 73 75  Resp: (!) 23 (!) 22 (!) 25 (!) 22  Temp:   97.6 F (36.4 C)   TempSrc:   Oral   SpO2: 97% 96% 98% 97%  Weight:      Height:       Physical Exam HENT:     Head: Normocephalic.     Mouth/Throat:     Pharynx: No oropharyngeal exudate.  Eyes:     General: Lids are normal.  Conjunctiva/sclera: Conjunctivae normal.  Cardiovascular:     Rate and Rhythm: Normal rate and regular rhythm.     Heart sounds: Normal heart sounds, S1 normal and S2 normal.  Pulmonary:     Breath sounds: No decreased breath sounds, wheezing, rhonchi or rales.  Abdominal:     Palpations: Abdomen is soft.     Tenderness: There is generalized abdominal tenderness.   Musculoskeletal:     Right lower leg: No swelling.     Left lower leg: No swelling.  Skin:    General: Skin is warm.     Findings: No rash.  Neurological:     Mental Status: He is alert.     Comments: Answers questions.     Data Reviewed: Creatinine 0.92  Family Communication: Spoke with sister on the phone twice, spoke with girlfriend  Disposition: Status is: Inpatient Remains inpatient appropriate because: Continue IV antibiotics for appendicitis.  Will get psychiatric and neurology evaluations.  Planned Discharge Destination: Home    Time spent: 40 minutes Case discussed with general surgery, neurology and psychiatry  Author: Verla Glaze, MD 08/17/2023 12:31 PM  For on call review [email or web address].

## 2023-08-17 NOTE — Progress Notes (Signed)
 Subjective:  CC: Luis Hunter is a 48 y.o. male  Hospital stay day 0,   seizure, appendicitis  HPI: No specific complaints  ROS:  General: Denies weight loss, weight gain, fatigue, fevers, chills, and night sweats. Heart: Denies chest pain, palpitations, racing heart, irregular heartbeat, leg pain or swelling, and decreased activity tolerance. Respiratory: Denies breathing difficulty, shortness of breath, wheezing, cough, and sputum. GI: Denies change in appetite, heartburn, nausea, vomiting, constipation, diarrhea, and blood in stool. GU: Denies difficulty urinating, pain with urinating, urgency, frequency, blood in urine.   Objective:   Temp:  [97.6 F (36.4 C)-98.7 F (37.1 C)] 98.7 F (37.1 C) (04/13 1230) Pulse Rate:  [62-87] 62 (04/13 1300) Resp:  [15-30] 23 (04/13 1300) BP: (114-151)/(75-100) 151/100 (04/13 1300) SpO2:  [95 %-100 %] 98 % (04/13 1300)     Height: 5\' 11"  (180.3 cm) Weight: 86.6 kg BMI (Calculated): 26.64   Intake/Output this shift:   Intake/Output Summary (Last 24 hours) at 08/17/2023 1438 Last data filed at 08/17/2023 1429 Gross per 24 hour  Intake 1417.87 ml  Output 1300 ml  Net 117.87 ml    Constitutional :  alert, cooperative, appears stated age, and no distress  Respiratory:  clear to auscultation bilaterally  Cardiovascular:  regular rate and rhythm  Gastrointestinal: Soft, some TTP periumbilical and RLQ .   Skin: Cool and moist.   Psychiatric: Normal affect, non-agitated, not confused       LABS:     Latest Ref Rng & Units 08/17/2023    3:03 AM 08/16/2023    7:19 AM 08/15/2023    9:07 PM  CMP  Glucose 70 - 99 mg/dL 99  60    BUN 6 - 20 mg/dL 14  18    Creatinine 1.61 - 1.24 mg/dL 0.96  0.45    Sodium 409 - 145 mmol/L 139  137    Potassium 3.5 - 5.1 mmol/L 3.6  3.5    Chloride 98 - 111 mmol/L 108  100    CO2 22 - 32 mmol/L 25  18    Calcium 8.9 - 10.3 mg/dL 8.1  8.8    Total Protein 6.5 - 8.1 g/dL 5.5  7.2  6.7   Total Bilirubin 0.0  - 1.2 mg/dL 0.8  1.0  1.0   Alkaline Phos 38 - 126 U/L 43  46  40   AST 15 - 41 U/L 30  41  50   ALT 0 - 44 U/L 25  29  31        Latest Ref Rng & Units 08/17/2023    3:03 AM 08/16/2023    7:19 AM 08/15/2023    3:31 PM  CBC  WBC 4.0 - 10.5 K/uL 12.1  22.2  10.4   Hemoglobin 13.0 - 17.0 g/dL 81.1  91.4  78.2   Hematocrit 39.0 - 52.0 % 37.4  45.8  42.3   Platelets 150 - 400 K/uL 91  109  112     RADS: N/A Assessment:   Appendicitis and seizure  Again extensively discussed risks benefits alternatives of proceeding with appendectomy at this time.  Verbal report from neurology stated risk of developing seizure around perioperative period will be the same even if we proceed now versus later as an outpatient basis.  Psych has signed off stating patient is competent to make his own medical decisions.  Discussed the concerns with his sister and his sister verbalized she believes it will be best for them to proceed with  appendectomy before discharge to eliminate the risk of recurrence.  Despite the above discussion and previous discussions multiple times, patient still requesting just antibiotic therapy for now, explicitly stating that he understands the risk of recurrence and possible onset of seizures being triggered from recurrent appendicitis as well.  Surgery will be available if he changes his mind.  Continue antibiotic care in the meantime and monitoring of other chronic medical issues per primary team.  labs/images/medications/previous chart entries reviewed personally and relevant changes/updates noted above.

## 2023-08-17 NOTE — Hospital Course (Signed)
 48 y.o. male with medical history significant of seizure disorder, however per syndrome, bipolar disorder, hypertension presenting with seizure, appendicitis.  Limited history in setting of postictal state status post benzo use.  Patient noted to have been evaluated yesterday for right sided abdominal pain.  Formally diagnosed with appendicitis.  General surgery consulted with recommendations including suregery. However, patient was only agreeable to antibiotics and left the hospital AGAINST MEDICAL ADVICE.  Per report, patient with witnessed GTC seizure at home by family overnight. Baseline history of chronic seizure.  No reported recent medication changes.  No reported illicit drug use. Presented to the ER afebrile, hemodynamically stable.  Satting well on room air.  Required multiple doses of Ativan as well as Valium to help with seizure event.  Status post IV Keppra load.  4/13.  Family concerned that mental status not back to baseline.  Patient wants to leave but is willing to stay secondary to family asking him to stay.  Will get psychiatry evaluation and also neurology evaluation.  On IV Zosyn for appendicitis. 4/14.  Patient had a fall climbing over the rail.  Has a send left knee pain but right shoulder okay.  Patient asked me a lot of questions about the surgery for his appendix.  Patient now agreeable. 4/15.  Patient seen this morning prior to operating room for appendicitis

## 2023-08-17 NOTE — Assessment & Plan Note (Signed)
 Resolved

## 2023-08-17 NOTE — Assessment & Plan Note (Signed)
 Could be secondary to infection but looking back he has had previous times where his platelet counts have been low.  Will check hepatitis C.

## 2023-08-17 NOTE — Assessment & Plan Note (Signed)
 Family concerned that he is not back to baseline.  Could be secondary to infection with appendicitis and/or seizure.  Continue IV antibiotics for appendicitis

## 2023-08-18 ENCOUNTER — Inpatient Hospital Stay

## 2023-08-18 DIAGNOSIS — F2089 Other schizophrenia: Secondary | ICD-10-CM | POA: Diagnosis not present

## 2023-08-18 DIAGNOSIS — M25562 Pain in left knee: Secondary | ICD-10-CM | POA: Insufficient documentation

## 2023-08-18 DIAGNOSIS — R569 Unspecified convulsions: Secondary | ICD-10-CM | POA: Diagnosis not present

## 2023-08-18 DIAGNOSIS — G9341 Metabolic encephalopathy: Secondary | ICD-10-CM | POA: Diagnosis not present

## 2023-08-18 DIAGNOSIS — N179 Acute kidney failure, unspecified: Secondary | ICD-10-CM | POA: Insufficient documentation

## 2023-08-18 DIAGNOSIS — K3589 Other acute appendicitis without perforation or gangrene: Secondary | ICD-10-CM

## 2023-08-18 DIAGNOSIS — E872 Acidosis, unspecified: Secondary | ICD-10-CM

## 2023-08-18 LAB — PROTIME-INR
INR: 1.1 (ref 0.8–1.2)
Prothrombin Time: 13.9 s (ref 11.4–15.2)

## 2023-08-18 LAB — GLUCOSE, CAPILLARY
Glucose-Capillary: 95 mg/dL (ref 70–99)
Glucose-Capillary: 101 mg/dL — ABNORMAL HIGH (ref 70–99)

## 2023-08-18 LAB — CBC
HCT: 39.9 % (ref 39.0–52.0)
Hemoglobin: 13.5 g/dL (ref 13.0–17.0)
MCH: 29.5 pg (ref 26.0–34.0)
MCHC: 33.8 g/dL (ref 30.0–36.0)
MCV: 87.1 fL (ref 80.0–100.0)
Platelets: 109 10*3/uL — ABNORMAL LOW (ref 150–400)
RBC: 4.58 MIL/uL (ref 4.22–5.81)
RDW: 16.1 % — ABNORMAL HIGH (ref 11.5–15.5)
WBC: 8.5 10*3/uL (ref 4.0–10.5)
nRBC: 0 % (ref 0.0–0.2)

## 2023-08-18 LAB — TECHNOLOGIST SMEAR REVIEW: Plt Morphology: NORMAL

## 2023-08-18 LAB — APTT: aPTT: 30 s (ref 24–36)

## 2023-08-18 LAB — HEPATITIS C ANTIBODY: HCV Ab: NONREACTIVE

## 2023-08-18 MED ORDER — CHLORHEXIDINE GLUCONATE CLOTH 2 % EX PADS
6.0000 | MEDICATED_PAD | Freq: Every day | CUTANEOUS | Status: DC
Start: 2023-08-19 — End: 2023-08-20
  Administered 2023-08-19 – 2023-08-20 (×2): 6 via TOPICAL

## 2023-08-18 NOTE — Evaluation (Signed)
 Occupational Therapy Evaluation Patient Details Name: Luis Hunter MRN: [id number] DOB: 09-21-1975 Today's Date: 08/18/2023   History of Present Illness   Pt is a 48 y/o M presented on 08/15/23 with c/o R sided abdominal pain, diagnosed with appendicitis. Surgery planned but pt left AMA prior to this. Pt returned on 08/16/23 s/p seizure s/p benzo use. Pt also being treated for encephalopathy. Pt declining surgery for appendicitis at this time. PMH: seizure disorder, bipolar disorder, HTN, depression, [place] syndrome, polysubstance abuse   Clinical Impressions Luis Hunter was seen for OT evaluation this date. Prior to hospital admission, pt was IND. Pt lives with girlfriend in first floor apartment. Pt presents to acute OT demonstrating impaired ADL performance and functional mobility 2/2  decreased dexterity and functional balance deficits. Pt currently requires CGA standing grooming tasks, 1 minor LOB reaching outside BOS requiring MIN A to correct. MOD I don/doff B socks in sitting. MIN A + HHA for ADL t/f ~200 ft, +2 for IV pole mgmt. Pt impulsive t/o session with poor safety awareness; unable to multi task or switch between 2 tasks without cues. Pt would benefit from skilled OT to address noted impairments and functional limitations (see below for any additional details). Upon hospital discharge, recommend OT follow up.    If plan is discharge home, recommend the following:   A little help with walking and/or transfers;A little help with bathing/dressing/bathroom;Supervision due to cognitive status;Help with stairs or ramp for entrance     Functional Status Assessment   Patient has had a recent decline in their functional status and demonstrates the ability to make significant improvements in function in a reasonable and predictable amount of time.     Equipment Recommendations   BSC/3in1     Recommendations for Other Services         Precautions/Restrictions    Precautions Precautions: Fall Precaution/Restrictions Comments: seizures Restrictions Weight Bearing Restrictions Per Provider Order: No     Mobility Bed Mobility               General bed mobility comments: seated on arrival    Transfers Overall transfer level: Needs assistance Equipment used: None Transfers: Sit to/from Stand Sit to Stand: Contact guard assist                  Balance Overall balance assessment: Needs assistance, History of Falls Sitting-balance support: Feet supported, No upper extremity supported Sitting balance-Leahy Scale: Good     Standing balance support: No upper extremity supported, During functional activity Standing balance-Leahy Scale: Poor                             ADL either performed or assessed with clinical judgement   ADL Overall ADL's : Needs assistance/impaired                                       General ADL Comments: CGA standing grooming tasks, 1 minor LOB reaching outside BOS requiring MIN A to correct. MOD I don/doff B socks in sitting. MIN A + HHA for ADL t/f ~200 ft.        Pertinent Vitals/Pain Pain Assessment Pain Assessment: Faces Faces Pain Scale: Hurts a little bit Pain Location: R shoulder, L knee 2/2 falls while here Pain Descriptors / Indicators: Discomfort Pain Intervention(s): Repositioned, Limited activity within patient's tolerance  Extremity/Trunk Assessment Upper Extremity Assessment Upper Extremity Assessment: Generalized weakness   Lower Extremity Assessment Lower Extremity Assessment: Generalized weakness   Cervical / Trunk Assessment Cervical / Trunk Assessment: Normal   Communication Communication Communication: Impaired Factors Affecting Communication: Reduced clarity of speech;Difficulty expressing self   Cognition Arousal: Alert Behavior During Therapy: Impulsive Cognition: Cognition impaired           Executive functioning impairment  (select all impairments): Problem solving OT - Cognition Comments: unable to multi task or switch between 2 tasks. Cues to initiate ADLs                 Following commands: Impaired Following commands impaired: Follows one step commands with increased time     Cueing  General Comments   Cueing Techniques: Verbal cues      Exercises     Shoulder Instructions      Home Living Family/patient expects to be discharged to:: Private residence Living Arrangements: Spouse/significant other (girlfriend Luis Hunter)) Available Help at Discharge: Family Type of Home: Apartment Home Access: Level entry     Home Layout: One level     Bathroom Shower/Tub: Walk-in shower             Additional Comments: partner is home during the day, on disability and does not work      Prior Functioning/Environment Prior Level of Function : Independent/Modified Independent             Mobility Comments: ambulatory without DME, does not work      OT Problem List: Decreased activity tolerance;Impaired balance (sitting and/or standing);Decreased safety awareness   OT Treatment/Interventions: Self-care/ADL training;Therapeutic exercise;Energy conservation;DME and/or AE instruction;Therapeutic activities;Cognitive remediation/compensation      OT Goals(Current goals can be found in the care plan section)   Acute Rehab OT Goals Patient Stated Goal: to go home OT Goal Formulation: With patient Time For Goal Achievement: 09/01/23 Potential to Achieve Goals: Good ADL Goals Pt Will Perform Grooming: Independently;standing Pt Will Perform Lower Body Dressing: Independently;sit to/from stand Pt Will Transfer to Toilet: Independently;ambulating;regular height toilet   OT Frequency:  Min 3X/week    Co-evaluation PT/OT/SLP Co-Evaluation/Treatment: Yes Reason for Co-Treatment: For patient/therapist safety;Necessary to address cognition/behavior during functional activity PT goals  addressed during session: Mobility/safety with mobility;Balance OT goals addressed during session: ADL's and self-care      AM-PAC OT "6 Clicks" Daily Activity     Outcome Measure Help from another person eating meals?: None Help from another person taking care of personal grooming?: A Little Help from another person toileting, which includes using toliet, bedpan, or urinal?: A Little Help from another person bathing (including washing, rinsing, drying)?: A Little Help from another person to put on and taking off regular upper body clothing?: None Help from another person to put on and taking off regular lower body clothing?: A Little 6 Click Score: 20   End of Session Equipment Utilized During Treatment: Gait belt Nurse Communication: Mobility status  Activity Tolerance: Patient tolerated treatment well Patient left: in chair;with call bell/phone within reach;with nursing/sitter in room  OT Visit Diagnosis: Other abnormalities of gait and mobility (R26.89);Muscle weakness (generalized) (M62.81)                Time: 1610-9604 OT Time Calculation (min): 15 min Charges:  OT General Charges $OT Visit: 1 Visit OT Evaluation $OT Eval Moderate Complexity: 1 Mod  Kathie Dike, M.S. OTR/L  08/18/23, 9:25 AM  ascom [phone]

## 2023-08-18 NOTE — Progress Notes (Signed)
 Notified by floor RN patient is anxious and is asking to leave the hospital grounds.  Entered room and patient was sitting up on his bed and stating that he needs to get back to taking care of his dog so asking if he could leave and come back for the surgery tomorrow.  I expressed specific concerns of the entire team as well as his family members that when we initially tried this approach over the weekend, patient came back with grand mal seizures and was unable to proceed with surgery.  I explained to him our concern is that he will be unable to take care of himself long enough to come back for an interval appendectomy on an elective basis.  Patient then started asking the specifics of the surgery including with the differences between an appendix and hernia, for which I had to specifically redirect him to state that he has appendicitis and we are proceeding with an appendectomy.  I also specifically mentioned that patient will not be able to proceed with surgery scheduled for tomorrow if he leaves outside hospital, since the case would be considered an elective case and will need to be rescheduled.  Patient seem to understand at the time I left the room.  Surgery will continue to be available and his surgery slot scheduled for tomorrow will be held.

## 2023-08-18 NOTE — Consult Note (Signed)
 NEUROLOGY CONSULTATION NOTE   Date of service: August 18, 2023 Patient Name: Luis Hunter MRN:  [id number] DOB:  1975/08/29 Reason for consult: breakthrough seizure Requesting physician: Dr. Alford Highland _ _ _   _ __   _ __ _ _  __ __   _ __   __ _  History of Present Illness   This is a 48 year old gentleman with past medical history significant for [facility] syndrome, seizure disorder, bipolar disorder, hypertension who presents with seizures and appendicitis.  Patient was evaluated the day prior to admission for right sided abdominal pain and formally diagnosed with appendicitis.  General surgery recommended surgical intervention but patient was agreeable only to antibiotics and left the hospital AGAINST MEDICAL ADVICE.  At home patient had a witnessed seizure and he was brought back to the ER where he was afebrile and hemodynamically stable.  He received multiple doses of Ativan as well as Valium with resolution of the seizures status post IV Keppra load.  He is on Keppra and Depakote at home and reported compliance.   ROS   Per HPI: all other systems reviewed and are negative  Past History   I have reviewed the following:  Past Medical History:  Diagnosis Date   Bipolar disorder (HCC)    Depression    Schizophrenia (HCC)    Seizures (HCC)    Past Surgical History:  Procedure Laterality Date   FINGER SURGERY     IR GASTROSTOMY TUBE MOD SED  01/16/2021   LEFT HEART CATH AND CORONARY ANGIOGRAPHY N/A 12/25/2020   Procedure: LEFT HEART CATH AND CORONARY ANGIOGRAPHY;  Surgeon: Yvonne Kendall, MD;  Location: [facility] INVASIVE CV LAB;  Service: Cardiovascular;  Laterality: N/A;   History reviewed. No pertinent family history. Social History   Socioeconomic History   Marital status: Single    Spouse name: Not on file   Number of children: Not on file   Years of education: Not on file   Highest education level: Not on file  Occupational History   Not on file  Tobacco Use    Smoking status: Every Day   Smokeless tobacco: Never  Substance and Sexual Activity   Alcohol use: No    Alcohol/week: 0.0 standard drinks of alcohol   Drug use: Yes    Types: Marijuana, Cocaine   Sexual activity: Not Currently  Other Topics Concern   Not on file  Social History Narrative   Not on file   Social Drivers of Health   Financial Resource Strain: Not on file  Food Insecurity: No Food Insecurity (08/17/2023)   Hunger Vital Sign    Worried About Running Out of Food in the Last Year: Never true    Ran Out of Food in the Last Year: Never true  Transportation Needs: No Transportation Needs (08/17/2023)   PRAPARE - Administrator, Civil Service (Medical): No    Lack of Transportation (Non-Medical): No  Physical Activity: Not on file  Stress: Not on file  Social Connections: Unknown (09/14/2021)   [facility]   Social Network    Social Network: Not on file   Allergies  Allergen Reactions   Lacosamide     EOSINOPHILIA   Lacosamide Other (See Comments)    Eosinophilia   Valproic Acid And Related     hyperammonemia   Valproic Acid And Related Other (See Comments)    hyperannonemia   Lamictal [Lamotrigine] Rash   Mirtazapine Rash  Medications   Medications Prior to Admission  Medication Sig Dispense Refill Last Dose/Taking   ARIPiprazole ER 960 MG/3.2ML PRSY Inject 960 mg into the muscle every 28 (twenty-eight) days.   08/15/2023 Bedtime   benztropine (COGENTIN) 0.5 MG tablet Take 2 tablets (1 mg total) by mouth 2 (two) times daily. 12 tablet 0 08/15/2023   divalproex (DEPAKOTE) 500 MG DR tablet Take 500 mg by mouth 2 (two) times daily.   08/15/2023   lansoprazole (PREVACID) 30 MG capsule Take 1 capsule (30 mg total) by mouth daily. 3 capsule 0 08/15/2023   levETIRAcetam (KEPPRA) 750 MG tablet Take 750 mg by mouth 2 (two) times daily.   08/15/2023   losartan (COZAAR) 50 MG tablet Take 1 tablet (50 mg total) by mouth daily. 3  tablet 0 08/15/2023   Capsaicin 0.1 % CREA Apply cream to upper abdomen as needed for nausea and vomiting.  Can use up to 4 times a day. (Patient not taking: Reported on 08/15/2023) 42.5 g 0    Cholecalciferol (D-1000) 25 MCG (1000 UT) tablet Take 1 tablet (1,000 Units total) by mouth daily. (Patient not taking: Reported on 08/16/2023) 3 tablet 0 Not Taking   famotidine (PEPCID) 20 MG tablet Take 1 tablet (20 mg total) by mouth 2 (two) times daily. 60 tablet 0    triamcinolone cream (KENALOG) 0.5 % Apply 1 Application topically 2 (two) times daily. (Patient not taking: Reported on 08/15/2023)         Current Facility-Administered Medications:    acetaminophen (TYLENOL) tablet 650 mg, 650 mg, Oral, Q6H PRN, Floydene Flock, MD   alum & mag hydroxide-simeth (MAALOX/MYLANTA) 200-200-20 MG/5ML suspension 30 mL, 30 mL, Oral, Q4H PRN, Renae Gloss, Richard, MD   benztropine (COGENTIN) tablet 1 mg, 1 mg, Oral, BID, Floydene Flock, MD, 1 mg at 08/17/23 2143   dextrose 10 % infusion, , Intravenous, Continuous, Marinell Blight, MD, Stopped at 08/16/23 1851   divalproex (DEPAKOTE) DR tablet 750 mg, 750 mg, Oral, Q12H, Floydene Flock, MD, 750 mg at 08/17/23 2145   enoxaparin (LOVENOX) injection 40 mg, 40 mg, Subcutaneous, Q24H, Floydene Flock, MD, 40 mg at 08/17/23 1819   [DISCONTINUED] haloperidol (HALDOL) tablet 0.5 mg, 0.5 mg, Oral, Q6H PRN **OR** haloperidol lactate (HALDOL) injection 5 mg, 5 mg, Intramuscular, Q8H PRN, Floydene Flock, MD, 5 mg at 08/18/23 0140   iohexol (OMNIPAQUE) 300 MG/ML solution 75 mL, 75 mL, Intravenous, Once PRN, Marinell Blight, MD   levETIRAcetam (KEPPRA) tablet 750 mg, 750 mg, Oral, BID, Floydene Flock, MD, 750 mg at 08/17/23 2143   LORazepam (ATIVAN) injection 1 mg, 1 mg, Intravenous, Q4H PRN, Floydene Flock, MD, 1 mg at 08/17/23 2346   losartan (COZAAR) tablet 50 mg, 50 mg, Oral, Daily, Floydene Flock, MD, 50 mg at 08/17/23 1027   nicotine (NICODERM CQ - dosed in mg/24  hours) patch 14 mg, 14 mg, Transdermal, Daily, Floydene Flock, MD, 14 mg at 08/17/23 1027   ondansetron (ZOFRAN) tablet 4 mg, 4 mg, Oral, Q6H PRN **OR** ondansetron (ZOFRAN) injection 4 mg, 4 mg, Intravenous, Q6H PRN, Floydene Flock, MD, 4 mg at 08/17/23 1036   pantoprazole (PROTONIX) EC tablet 20 mg, 20 mg, Oral, Daily, Floydene Flock, MD, 20 mg at 08/17/23 1028   piperacillin-tazobactam (ZOSYN) IVPB 3.375 g, 3.375 g, Intravenous, Q8H, Lowella Bandy, RPH, Last Rate: 12.5 mL/hr at 08/18/23 0659, 3.375 g at 08/18/23 1610  Vitals   Vitals:  08/17/23 1630 08/17/23 1815 08/17/23 2038 08/18/23 0620  BP: (!) 154/102 (!) 151/102 (!) 148/102 (!) 124/96  Pulse: 71 82 72 91  Resp: 20 18  18   Temp: 98.7 F (37.1 C) 97.7 F (36.5 C) 98.1 F (36.7 C) 98.7 F (37.1 C)  TempSrc: Oral Oral  Oral  SpO2: 100% 99% 100% 100%  Weight:      Height:         Body mass index is 26.63 kg/m.  Physical Exam   Gen: patient lying in bed, NAD CV: extremities appear well-perfused Resp: normal WOB  Neurologic exam MS: alert, oriented x4, follows commands Speech: mild dysarthria, no aphasia CN: PERRL, VFF, EOMI, sensation intact, face symmetric, hearing intact to voice Motor: 5/5 strength throughout Sensory: SILT Reflexes: 2+ symm with toes down bilat Coordination: FNF intact bilat Gait: deferred    Labs   CBC:  Recent Labs  Lab 08/16/23 0719 08/17/23 0303 08/18/23 0555  WBC 22.2* 12.1* 8.5  NEUTROABS 19.3*  --   --   HGB 14.5 12.9* 13.5  HCT 45.8 37.4* 39.9  MCV 93.3 86.8 87.1  PLT 109* 91* 109*    Basic Metabolic Panel:  Lab Results  Component Value Date   NA 139 08/17/2023   K 3.6 08/17/2023   CO2 25 08/17/2023   GLUCOSE 99 08/17/2023   BUN 14 08/17/2023   CREATININE 0.92 08/17/2023   CALCIUM 8.1 (L) 08/17/2023   GFRNONAA >60 08/17/2023   GFRAA >60 12/07/2019   Lipid Panel:  Lab Results  Component Value Date   LDLCALC 87 11/19/2019   HgbA1c:  Lab Results   Component Value Date   HGBA1C 4.8 04/26/2021   Urine Drug Screen:     Component Value Date/Time   LABOPIA NONE DETECTED 08/16/2023 1128   LABOPIA NONE DETECTED 11/15/2022 1642   COCAINSCRNUR NONE DETECTED 08/16/2023 1128   LABBENZ POSITIVE (A) 08/16/2023 1128   LABBENZ POSITIVE (A) 11/15/2022 1642   AMPHETMU NONE DETECTED 08/16/2023 1128   AMPHETMU NONE DETECTED 11/15/2022 1642   THCU POSITIVE (A) 08/16/2023 1128   THCU POSITIVE (A) 11/15/2022 1642   LABBARB NONE DETECTED 08/16/2023 1128   LABBARB NONE DETECTED 11/15/2022 1642    Alcohol Level     Component Value Date/Time   ETH <10 08/16/2023 0719    CT Head without contrast: No acute process  Impression   This is a 48 year old gentleman with past medical history significant for [place] syndrome, seizure disorder, bipolar disorder, hypertension who presents with seizures and appendicitis.  Patient was evaluated the day prior to admission for right sided abdominal pain and formally diagnosed with appendicitis.  General surgery recommended surgical intervention but patient was agreeable only to antibiotics and left the hospital AGAINST MEDICAL ADVICE.  At home patient had a witnessed seizure and he was brought back to the ER where he was afebrile and hemodynamically stable.  He received multiple doses of Ativan as well as Valium with resolution of the seizures status post IV Keppra load.  He is on Keppra and Depakote at home and reported compliance. Depakote level was 44. His depakote was increased to 750mg  bid and keppra continued at home dose. If patient decides to continue with surgery this would be reasonable given his depakote was increased to provide extra protection against seizure. I can't be sure he will not have a seizure associated with the procedure but the risk is as low as it will ever be going forward. If surgery is indicated and  patient is amenable neurology recommends to proceed with surgery.  Recommendations   -  Continue current AED regimen - Neurology to sign off, please re-engage if additional neurologic concerns arise ______________________________________________________________________   Thank you for the opportunity to take part in the care of this patient. If you have any further questions, please contact the neurology consultation attending.  Signed,  Greg Leaks, MD [facility] [phone]  If 7pm- 7am, please page neurology on call as listed in AMION.  **Any copied and pasted documentation in this note was written by me in another application not billed for and pasted by me into this document.

## 2023-08-18 NOTE — Progress Notes (Signed)
 Subjective:  CC: Luis Hunter is a 48 y.o. male  Hospital stay day 1,   seizure, appendicitis  HPI: Notified by primary team patient is now amenable to surgery.  Patient reports another episode of bad abdominal pain last night.  Rated proceed with surgery now.  ROS:  General: Denies weight loss, weight gain, fatigue, fevers, chills, and night sweats. Heart: Denies chest pain, palpitations, racing heart, irregular heartbeat, leg pain or swelling, and decreased activity tolerance. Respiratory: Denies breathing difficulty, shortness of breath, wheezing, cough, and sputum. GI: Denies change in appetite, heartburn, nausea, vomiting, constipation, diarrhea, and blood in stool. GU: Denies difficulty urinating, pain with urinating, urgency, frequency, blood in urine.   Objective:   Temp:  [97.7 F (36.5 C)-98.7 F (37.1 C)] 98.7 F (37.1 C) (04/14 0620) Pulse Rate:  [71-91] 91 (04/14 0620) Resp:  [18-22] 18 (04/14 0620) BP: (124-154)/(96-102) 124/96 (04/14 0620) SpO2:  [99 %-100 %] 100 % (04/14 0620)     Height: 5\' 11"  (180.3 cm) Weight: 86.6 kg BMI (Calculated): 26.64   Intake/Output this shift:   Intake/Output Summary (Last 24 hours) at 08/18/2023 1307 Last data filed at 08/18/2023 1154 Gross per 24 hour  Intake 83.49 ml  Output 200 ml  Net -116.51 ml    Constitutional :  alert, cooperative, appears stated age, and no distress  Respiratory:  clear to auscultation bilaterally  Cardiovascular:  regular rate and rhythm  Gastrointestinal: Soft, some TTP periumbilical and RLQ .   Skin: Cool and moist.   Psychiatric: Normal affect, non-agitated, not confused       LABS:     Latest Ref Rng & Units 08/17/2023    3:03 AM 08/16/2023    7:19 AM 08/15/2023    9:07 PM  CMP  Glucose 70 - 99 mg/dL 99  60    BUN 6 - 20 mg/dL 14  18    Creatinine 4.09 - 1.24 mg/dL 8.11  9.14    Sodium 782 - 145 mmol/L 139  137    Potassium 3.5 - 5.1 mmol/L 3.6  3.5    Chloride 98 - 111 mmol/L 108  100     CO2 22 - 32 mmol/L 25  18    Calcium 8.9 - 10.3 mg/dL 8.1  8.8    Total Protein 6.5 - 8.1 g/dL 5.5  7.2  6.7   Total Bilirubin 0.0 - 1.2 mg/dL 0.8  1.0  1.0   Alkaline Phos 38 - 126 U/L 43  46  40   AST 15 - 41 U/L 30  41  50   ALT 0 - 44 U/L 25  29  31        Latest Ref Rng & Units 08/18/2023    5:55 AM 08/17/2023    3:03 AM 08/16/2023    7:19 AM  CBC  WBC 4.0 - 10.5 K/uL 8.5  12.1  22.2   Hemoglobin 13.0 - 17.0 g/dL 95.6  21.3  08.6   Hematocrit 39.0 - 52.0 % 39.9  37.4  45.8   Platelets 150 - 400 K/uL 109  91  109     RADS: N/A Assessment:   Appendicitis and seizure  Again extensively discussed risks benefits alternatives of proceeding with appendectomy at this time.  He is now agreeable to proceed.  Continue current management until surgery.   labs/images/medications/previous chart entries reviewed personally and relevant changes/updates noted above.

## 2023-08-18 NOTE — Progress Notes (Signed)
 Introduced patient to role of Statistician. Intake questions completed.  Patient states he "mostly" lives alone with his dog. He has an "on and off" girlfriend that is "around till [he] starts acting like a chimpanzee."   Patient does still have an active ACTT team in place. Although he states, "They come find me. I don't go looking for them."   When asked if he has enough food to get him through the month, he replies "I'm honestly not sure what all is in the fridge. I can make a call and find out."  Patient does NOT drive, but get his girlfriend or other family members to take him to/from appointments.  During conversation patient began to state he was feeling "cagy" and "ready to leave." Patient stated he was "worried about [his] dog" and that his dog is what "keeps [him] sane." Sitter at bedside states she assisted him to call his girlfriend earlier and that girlfriend stated she was looking after dog.  Patient requesting to speak with surgeon or physician as he would like to leave AMA and "come back tomorrow." Explained to patient that if he came back the process would have to start all over again and that he could possibly become much more ill. Providers contacted per request.

## 2023-08-18 NOTE — Evaluation (Signed)
 Physical Therapy Evaluation Patient Details Name: Luis Hunter MRN: [id number] DOB: [birth year] Today's Date: 08/18/2023  History of Present Illness  Pt is a 48 y/o M presented on 08/15/23 with c/o R sided abdominal pain, diagnosed with appendicitis. Surgery planned but pt left AMA prior to this. Pt returned on 08/16/23 s/p seizure s/p benzo use. Pt also being treated for encephalopathy. Pt declining surgery for appendicitis at this time. PMH: seizure disorder, bipolar disorder, HTN, depression, [place] syndrome, polysubstance abuse  Clinical Impression  Pt seen for PT evaluation with co-tx with OT for pt & therapists' safety. Pt reports prior to admission he was independent without AD, living with his girlfriend in a 1 level apartment with level entry. On this date, pt is able to complete bed mobility with supervision, STS with CGA & ambulate around unit without AD with min assist + 2nd person present for safety & to manage IV pole. Pt with knee instability, one episode of buckling when standing at sink but able to correct with min assist. Pt would benefit from ongoing PT services to address balance, gait with LRAD, to reduce fall risk.      If plan is discharge home, recommend the following: A little help with walking and/or transfers;Assistance with cooking/housework;A little help with bathing/dressing/bathroom;Assist for transportation;Help with stairs or ramp for entrance;Direct supervision/assist for financial management;Supervision due to cognitive status   Can travel by private vehicle        Equipment Recommendations None recommended by PT  Recommendations for Other Services       Functional Status Assessment Patient has had a recent decline in their functional status and demonstrates the ability to make significant improvements in function in a reasonable and predictable amount of time.     Precautions / Restrictions Precautions Precautions: Fall Precaution/Restrictions Comments:  seizures Restrictions Weight Bearing Restrictions Per Provider Order: No      Mobility  Bed Mobility Overal bed mobility: Needs Assistance Bed Mobility: Supine to Sit     Supine to sit: Supervision, HOB elevated, Used rails          Transfers Overall transfer level: Needs assistance Equipment used: None Transfers: Sit to/from Stand Sit to Stand: Contact guard assist                Ambulation/Gait Ambulation/Gait assistance: Min assist, +2 safety/equipment Gait Distance (Feet): 170 Feet Assistive device: None Gait Pattern/deviations: Decreased step length - right, Decreased step length - left, Decreased stride length Gait velocity: decreased     General Gait Details: Pt ambulates around unit without AD with head down/forward with cuing to hold head upright. 1 episode of buckling when standing at sink but able to recover with min assist.  Stairs            Wheelchair Mobility     Tilt Bed    Modified Rankin (Stroke Patients Only)       Balance Overall balance assessment: Needs assistance, History of Falls (hx of falls during this admission) Sitting-balance support: Feet supported Sitting balance-Leahy Scale: Fair     Standing balance support: During functional activity, No upper extremity supported Standing balance-Leahy Scale: Poor                               Pertinent Vitals/Pain Pain Assessment Pain Assessment: Faces Faces Pain Scale: Hurts a little bit Pain Location: R shoulder, L knee 2/2 falls while here Pain Descriptors / Indicators: Discomfort  Pain Intervention(s): Monitored during session (MD made aware)    Home Living Family/patient expects to be discharged to:: Private residence Living Arrangements: Spouse/significant other (girlfriend Luis Hunter)) Available Help at Discharge: Family Type of Home: Apartment Home Access: Level entry       Home Layout: One level        Prior Function Prior Level of Function :  Independent/Modified Independent             Mobility Comments: ambulatory without DME, does not work       Extremity/Trunk Assessment   Upper Extremity Assessment Upper Extremity Assessment: Generalized weakness    Lower Extremity Assessment Lower Extremity Assessment: Generalized weakness    Cervical / Trunk Assessment Cervical / Trunk Assessment: Normal  Communication   Communication Communication: Impaired Factors Affecting Communication: Reduced clarity of speech;Difficulty expressing self    Cognition Arousal: Alert Behavior During Therapy: Impulsive   PT - Cognitive impairments: Awareness, Safety/Judgement, Problem solving                       PT - Cognition Comments: Pt AxOx4 but with poor awareness, asking for an ounce of weed, saying his brother will come pick him up when he's ready to go. Decreased awareness of safety with mobility. Following commands: Impaired Following commands impaired: Follows one step commands with increased time     Cueing Cueing Techniques: Verbal cues     General Comments      Exercises     Assessment/Plan    PT Assessment Patient needs continued PT services  PT Problem List Decreased strength;Decreased coordination;Pain;Decreased cognition;Decreased range of motion;Decreased activity tolerance;Decreased balance;Decreased mobility;Decreased safety awareness;Decreased knowledge of use of DME       PT Treatment Interventions DME instruction;Balance training;Neuromuscular re-education;Gait training;Stair training;Functional mobility training;Therapeutic activities;Therapeutic exercise;Patient/family education;Manual techniques    PT Goals (Current goals can be found in the Care Plan section)  Acute Rehab PT Goals Patient Stated Goal: go home PT Goal Formulation: With patient Time For Goal Achievement: 09/01/23 Potential to Achieve Goals: Good    Frequency Min 3X/week     Co-evaluation PT/OT/SLP  Co-Evaluation/Treatment: Yes Reason for Co-Treatment: For patient/therapist safety;Necessary to address cognition/behavior during functional activity PT goals addressed during session: Mobility/safety with mobility;Balance         AM-PAC PT "6 Clicks" Mobility  Outcome Measure Help needed turning from your back to your side while in a flat bed without using bedrails?: A Little Help needed moving from lying on your back to sitting on the side of a flat bed without using bedrails?: A Little Help needed moving to and from a bed to a chair (including a wheelchair)?: A Little Help needed standing up from a chair using your arms (e.g., wheelchair or bedside chair)?: A Little Help needed to walk in hospital room?: A Little Help needed climbing 3-5 steps with a railing? : A Lot 6 Click Score: 17    End of Session Equipment Utilized During Treatment: Gait belt Activity Tolerance: Patient tolerated treatment well Patient left: in chair;with call bell/phone within reach;with nursing/sitter in room (set up with meal tray) Nurse Communication: Mobility status PT Visit Diagnosis: Muscle weakness (generalized) (M62.81);Unsteadiness on feet (R26.81);Other abnormalities of gait and mobility (R26.89);History of falling (Z91.81);Difficulty in walking, not elsewhere classified (R26.2)    Time: 8413-2440 PT Time Calculation (min) (ACUTE ONLY): 25 min   Charges:   PT Evaluation $PT Eval Moderate Complexity: 1 Mod   PT General Charges $$ ACUTE PT VISIT:  1 Visit         Emaline Handsome, PT, DPT 08/18/23, 8:46 AM   Venetta Gill 08/18/2023, 8:44 AM

## 2023-08-18 NOTE — Plan of Care (Signed)
       CROSS COVER NOTE  NAME: Luis Hunter MRN: [id number] DOB : Jul 19, 1975    Concern as stated by nurse / staff   this patient fell again. Hew has reported having bilateral knee discomfort. He remains free from any noted signs of associated symptoms. Security called, currently in the room. Patient has voided in the urinal Would it be appropriate for you to order waist restraints at this time. Mats are placed on the floor for added safety measures.      Patient assessment Spoke with nurse at bedside: Witnessed fall.  Patient lunged out of bed and fell onto all fours- knees and elbows.  He immediately stood back up and was redirected to the bed.  He complained of knee pain after the fall.  No swelling or bruising   Patient sedated in bed.  Sitter at bedside  Assessment and  Interventions   Assessment:  Fall off bed, now with knee pain but resting comfortably, ambulatory post fall  Plan: Monitor for increasing pain, swelling and consider imaging Soft waist restraint ordered as patient reported to be overpowering staff X

## 2023-08-18 NOTE — H&P (View-Only) (Signed)
 Subjective:  CC: Luis Hunter is a 48 y.o. male  Hospital stay day 1,   seizure, appendicitis  HPI: Notified by primary team patient is now amenable to surgery.  Patient reports another episode of bad abdominal pain last night.  Rated proceed with surgery now.  ROS:  General: Denies weight loss, weight gain, fatigue, fevers, chills, and night sweats. Heart: Denies chest pain, palpitations, racing heart, irregular heartbeat, leg pain or swelling, and decreased activity tolerance. Respiratory: Denies breathing difficulty, shortness of breath, wheezing, cough, and sputum. GI: Denies change in appetite, heartburn, nausea, vomiting, constipation, diarrhea, and blood in stool. GU: Denies difficulty urinating, pain with urinating, urgency, frequency, blood in urine.   Objective:   Temp:  [97.7 F (36.5 C)-98.7 F (37.1 C)] 98.7 F (37.1 C) (04/14 0620) Pulse Rate:  [71-91] 91 (04/14 0620) Resp:  [18-22] 18 (04/14 0620) BP: (124-154)/(96-102) 124/96 (04/14 0620) SpO2:  [99 %-100 %] 100 % (04/14 0620)     Height: 5\' 11"  (180.3 cm) Weight: 86.6 kg BMI (Calculated): 26.64   Intake/Output this shift:   Intake/Output Summary (Last 24 hours) at 08/18/2023 1307 Last data filed at 08/18/2023 1154 Gross per 24 hour  Intake 83.49 ml  Output 200 ml  Net -116.51 ml    Constitutional :  alert, cooperative, appears stated age, and no distress  Respiratory:  clear to auscultation bilaterally  Cardiovascular:  regular rate and rhythm  Gastrointestinal: Soft, some TTP periumbilical and RLQ .   Skin: Cool and moist.   Psychiatric: Normal affect, non-agitated, not confused       LABS:     Latest Ref Rng & Units 08/17/2023    3:03 AM 08/16/2023    7:19 AM 08/15/2023    9:07 PM  CMP  Glucose 70 - 99 mg/dL 99  60    BUN 6 - 20 mg/dL 14  18    Creatinine 4.09 - 1.24 mg/dL 8.11  9.14    Sodium 782 - 145 mmol/L 139  137    Potassium 3.5 - 5.1 mmol/L 3.6  3.5    Chloride 98 - 111 mmol/L 108  100     CO2 22 - 32 mmol/L 25  18    Calcium 8.9 - 10.3 mg/dL 8.1  8.8    Total Protein 6.5 - 8.1 g/dL 5.5  7.2  6.7   Total Bilirubin 0.0 - 1.2 mg/dL 0.8  1.0  1.0   Alkaline Phos 38 - 126 U/L 43  46  40   AST 15 - 41 U/L 30  41  50   ALT 0 - 44 U/L 25  29  31        Latest Ref Rng & Units 08/18/2023    5:55 AM 08/17/2023    3:03 AM 08/16/2023    7:19 AM  CBC  WBC 4.0 - 10.5 K/uL 8.5  12.1  22.2   Hemoglobin 13.0 - 17.0 g/dL 95.6  21.3  08.6   Hematocrit 39.0 - 52.0 % 39.9  37.4  45.8   Platelets 150 - 400 K/uL 109  91  109     RADS: N/A Assessment:   Appendicitis and seizure  Again extensively discussed risks benefits alternatives of proceeding with appendectomy at this time.  He is now agreeable to proceed.  Continue current management until surgery.   labs/images/medications/previous chart entries reviewed personally and relevant changes/updates noted above.

## 2023-08-18 NOTE — Progress Notes (Signed)
 Progress Note   Patient: Luis Hunter WUJ:[id number] DOB: February 02, 1976 DOA: 08/16/2023     1 DOS: the patient was seen and examined on 08/18/2023   Brief hospital course: 48 y.o. male with medical history significant of seizure disorder, however per syndrome, bipolar disorder, hypertension presenting with seizure, appendicitis.  Limited history in setting of postictal state status post benzo use.  Patient noted to have been evaluated yesterday for right sided abdominal pain.  Formally diagnosed with appendicitis.  General surgery consulted with recommendations including suregery. However, patient was only agreeable to antibiotics and left the hospital AGAINST MEDICAL ADVICE.  Per report, patient with witnessed GTC seizure at home by family overnight. Baseline history of chronic seizure.  No reported recent medication changes.  No reported illicit drug use. Presented to the ER afebrile, hemodynamically stable.  Satting well on room air.  Required multiple doses of Ativan as well as Valium to help with seizure event.  Status post IV Keppra load.  4/13.  Family concerned that mental status not back to baseline.  Patient wants to leave but is willing to stay secondary to family asking him to stay.  Will get psychiatry evaluation and also neurology evaluation.  On IV Zosyn for appendicitis. 4/14.  Patient had a fall climbing over the rail.  Has a send left knee pain but right shoulder okay.  Patient asked me a lot of questions about the surgery for his appendix.  Patient now agreeable.  Assessment and Plan: * Appendicitis Noted recent diagnosis of appendicitis with recent surgical evaluation and patient left hospital AGAINST MEDICAL ADVICE Continue IV Zosyn for now Case discussed with Dr. Tonna Boehringer general surgery yesterday and he will reassess patient to determine timing of surgery.  Seizure (HCC) [place] Syndrome Witnessed breakthrough seizure with persistence of symptoms on presentation to the  ER Status post IV Keppra load Continue home Keppra and increase Depakote dose. Appreciate neurology consultation.  Acute metabolic encephalopathy Back to baseline mental status  Schizophrenia (HCC) Continue home regimen including abilify and cogentin .  Appreciate psychiatric consultation  Polysubstance abuse (HCC) Urine drug screen positive for benzodiazepine and cannabis.  Cannabis can increase risk of seizure.  Likely received benzodiazepine in ER prior to urine toxicology being sent off.  Essential hypertension On losartan  Thrombocytopenia (HCC) Could be secondary to infection but looking back he has had previous times where his platelet counts have been low.  Will check hepatitis C.  Today's platelet count 109.  Left knee pain Patient has a bruise medial left knee.  X-ray pending  AKI (acute kidney injury) (HCC) Creatinine 0.84 on 4/11.  On 9/12 up at 1.24.  Today's creatinine 0.92.  Visual hallucinations Resolved  Lactic acidosis Secondary to seizure not sepsis.        Subjective: Patient has a lot of questions about surgery.  I told him that Dr. Tonna Boehringer will speak to him about the surgical details.  Patient does have some abdominal pain when palpating the right lower quadrant.  No nausea or vomiting.  Admitted with seizure.  Physical Exam: Vitals:   08/17/23 1630 08/17/23 1815 08/17/23 2038 08/18/23 0620  BP: (!) 154/102 (!) 151/102 (!) 148/102 (!) 124/96  Pulse: 71 82 72 91  Resp: 20 18  18   Temp: 98.7 F (37.1 C) 97.7 F (36.5 C) 98.1 F (36.7 C) 98.7 F (37.1 C)  TempSrc: Oral Oral  Oral  SpO2: 100% 99% 100% 100%  Weight:      Height:  Physical Exam HENT:     Head: Normocephalic.     Mouth/Throat:     Pharynx: No oropharyngeal exudate.  Eyes:     General: Lids are normal.     Conjunctiva/sclera: Conjunctivae normal.  Cardiovascular:     Rate and Rhythm: Normal rate and regular rhythm.     Heart sounds: Normal heart sounds, S1 normal and  S2 normal.  Pulmonary:     Breath sounds: No decreased breath sounds, wheezing, rhonchi or rales.  Abdominal:     Palpations: Abdomen is soft.     Tenderness: There is abdominal tenderness in the right lower quadrant.  Musculoskeletal:     Right lower leg: No swelling.     Left lower leg: No swelling.     Comments: Bruising medial right knee.  Good range of motion.  Skin:    General: Skin is warm.     Findings: No rash.  Neurological:     Mental Status: He is alert.     Comments: Answers questions.     Data Reviewed: Knee x-ray pending, creatinine 0.92, platelet count 109, white blood count 8.5  Family Communication: Permission to speak in front of friend at bedside  Disposition: Status is: Inpatient Remains inpatient appropriate because: Surgery to discuss appendectomy with patient  Planned Discharge Destination: Home    Time spent: 28 minutes  Author: Verla Glaze, MD 08/18/2023 11:34 AM  For on call review [email or web address].

## 2023-08-18 NOTE — Assessment & Plan Note (Signed)
 Secondary to seizure not sepsis.

## 2023-08-18 NOTE — Assessment & Plan Note (Signed)
 Patient has a bruise medial left knee.  X-ray pending

## 2023-08-18 NOTE — Assessment & Plan Note (Signed)
 Creatinine 0.84 on 4/11.  On 9/12 up at 1.24.  Today's creatinine 0.92.

## 2023-08-18 NOTE — Progress Notes (Signed)
 Patient noted to be resting in bed, currently, eyes closed.  Able to open eyes with name being called/tactile stimulation.  During night shift, patient noted to have two events of falls. Hospital MD notified of events.  Patient continues top have 1:1 sitter.  Patient noted to have increased impulsive behavior throughout night shift.  Patient required redirection frequently by all night staff.  During first falls event, the patient was attempting to get out of bed, legs became entangled in bedding, and he fell, hitting his head on trash can.  Patient was assessed.  Skin remained intact.  No noted edema to face/eyes/head.  Floor mat placed at bed side for falls prevention/safety.  ~ 1 hour later, the patient impulsively got out of bed on the opposite side the mat was not on, and fell on bilateral knees.  Hospital MD notified.  Patient was assessed.  No noted signs of altered skin integrity assessed, no noted edema to bilateral knees.  Patient assisted back into bed.  Patient has received medication to assist with increased agitation.  Hospital security was also called during both episodes.  Hospital MD visited patient, for assessment.  Patient  able to remain calm, in the bed ~ 30 minutes after most recent fall.  Patient has remained free from any additional signs of acute distress.  No noted signs of associated symptoms assessed.  PIV remains intact upon inspection.  MD able to write for soft restraints, as needed for any additional signs of increased impulsive behavior, as well as continuing with 1:1 sitter, for safety needs.  Patient will continued to be monitored by hospital staff until discharge.

## 2023-08-19 ENCOUNTER — Encounter
Admission: EM | Disposition: A | Discharge status: Home / Self Care | Payer: Self-pay | Source: Home / Self Care | Attending: Internal Medicine

## 2023-08-19 ENCOUNTER — Other Ambulatory Visit: Payer: Self-pay

## 2023-08-19 ENCOUNTER — Inpatient Hospital Stay: Admitting: Certified Registered"

## 2023-08-19 ENCOUNTER — Encounter: Payer: Self-pay | Admitting: Internal Medicine

## 2023-08-19 DIAGNOSIS — K3589 Other acute appendicitis without perforation or gangrene: Secondary | ICD-10-CM | POA: Diagnosis not present

## 2023-08-19 DIAGNOSIS — F209 Schizophrenia, unspecified: Secondary | ICD-10-CM

## 2023-08-19 DIAGNOSIS — G9341 Metabolic encephalopathy: Secondary | ICD-10-CM | POA: Diagnosis not present

## 2023-08-19 DIAGNOSIS — R569 Unspecified convulsions: Secondary | ICD-10-CM | POA: Diagnosis not present

## 2023-08-19 HISTORY — PX: XI ROBOTIC LAPAROSCOPIC ASSISTED APPENDECTOMY: SHX6877

## 2023-08-19 SURGERY — APPENDECTOMY, ROBOT-ASSISTED, LAPAROSCOPIC
Anesthesia: General

## 2023-08-19 MED ORDER — LIDOCAINE-EPINEPHRINE (PF) 1 %-1:200000 IJ SOLN
INTRAMUSCULAR | Status: AC
  Filled 2023-08-19: qty 30

## 2023-08-19 MED ORDER — DROPERIDOL 2.5 MG/ML IJ SOLN
0.6250 mg | Freq: Once | INTRAMUSCULAR | Status: AC | PRN
  Administered 2023-08-19: 0.625 mg via INTRAVENOUS

## 2023-08-19 MED ORDER — LORAZEPAM 2 MG/ML IJ SOLN
INTRAMUSCULAR | Status: AC
  Filled 2023-08-19: qty 1

## 2023-08-19 MED ORDER — LORAZEPAM 2 MG/ML IJ SOLN
0.5000 mg | Freq: Once | INTRAMUSCULAR | Status: AC
  Administered 2023-08-19: 0.5 mg via INTRAVENOUS
  Filled 2023-08-19: qty 0.25

## 2023-08-19 MED ORDER — LACTATED RINGERS IV SOLN: INTRAVENOUS | Status: DC | PRN

## 2023-08-19 MED ORDER — SUCCINYLCHOLINE CHLORIDE 200 MG/10ML IV SOSY
PREFILLED_SYRINGE | INTRAVENOUS | Status: DC | PRN
  Administered 2023-08-19: 120 mg via INTRAVENOUS

## 2023-08-19 MED ORDER — 0.9 % SODIUM CHLORIDE (POUR BTL) OPTIME
TOPICAL | Status: DC | PRN
  Administered 2023-08-19: 1000 mL

## 2023-08-19 MED ORDER — FENTANYL CITRATE (PF) 100 MCG/2ML IJ SOLN
INTRAMUSCULAR | Status: AC
  Filled 2023-08-19: qty 2

## 2023-08-19 MED ORDER — FENTANYL CITRATE (PF) 100 MCG/2ML IJ SOLN
INTRAMUSCULAR | Status: DC | PRN
  Administered 2023-08-19 (×2): 50 ug via INTRAVENOUS

## 2023-08-19 MED ORDER — ONDANSETRON HCL 4 MG/2ML IJ SOLN
INTRAMUSCULAR | Status: DC | PRN
  Administered 2023-08-19: 4 mg via INTRAVENOUS

## 2023-08-19 MED ORDER — HYDROCODONE-ACETAMINOPHEN 5-325 MG PO TABS: 1.0000 | ORAL_TABLET | Freq: Four times a day (QID) | ORAL | Status: DC | PRN

## 2023-08-19 MED ORDER — DEXAMETHASONE SODIUM PHOSPHATE 10 MG/ML IJ SOLN
INTRAMUSCULAR | Status: DC | PRN
  Administered 2023-08-19: 10 mg via INTRAVENOUS

## 2023-08-19 MED ORDER — ARTIFICIAL TEARS OPHTHALMIC OINT
TOPICAL_OINTMENT | OPHTHALMIC | Status: AC
  Filled 2023-08-19: qty 3.5

## 2023-08-19 MED ORDER — MORPHINE SULFATE (PF) 2 MG/ML IV SOLN
2.0000 mg | INTRAVENOUS | Status: DC | PRN
  Administered 2023-08-19 – 2023-08-20 (×3): 2 mg via INTRAVENOUS
  Filled 2023-08-19 (×3): qty 1

## 2023-08-19 MED ORDER — BUPIVACAINE HCL 0.5 % IJ SOLN
INTRAMUSCULAR | Status: DC | PRN
  Administered 2023-08-19: 15 mL via INTRAMUSCULAR

## 2023-08-19 MED ORDER — LIDOCAINE HCL (CARDIAC) PF 100 MG/5ML IV SOSY
PREFILLED_SYRINGE | INTRAVENOUS | Status: DC | PRN
  Administered 2023-08-19: 100 mg via INTRAVENOUS

## 2023-08-19 MED ORDER — DROPERIDOL 2.5 MG/ML IJ SOLN
INTRAMUSCULAR | Status: AC
  Filled 2023-08-19: qty 2

## 2023-08-19 MED ORDER — SUGAMMADEX SODIUM 200 MG/2ML IV SOLN
INTRAVENOUS | Status: DC | PRN
  Administered 2023-08-19: 100 mg via INTRAVENOUS
  Administered 2023-08-19: 200 mg via INTRAVENOUS

## 2023-08-19 MED ORDER — DEXMEDETOMIDINE HCL IN NACL 200 MCG/50ML IV SOLN
INTRAVENOUS | Status: DC | PRN
  Administered 2023-08-19: 8 ug via INTRAVENOUS
  Administered 2023-08-19: 12 ug via INTRAVENOUS
  Administered 2023-08-19: 4 ug via INTRAVENOUS

## 2023-08-19 MED ORDER — MIDAZOLAM HCL 2 MG/2ML IJ SOLN
INTRAMUSCULAR | Status: AC
  Filled 2023-08-19: qty 2

## 2023-08-19 MED ORDER — BUPIVACAINE HCL (PF) 0.5 % IJ SOLN
INTRAMUSCULAR | Status: AC
  Filled 2023-08-19: qty 30

## 2023-08-19 MED ORDER — PROMETHAZINE HCL 25 MG/ML IJ SOLN
6.2500 mg | Freq: Once | INTRAMUSCULAR | Status: DC
  Filled 2023-08-19: qty 0.25

## 2023-08-19 MED ORDER — MIDAZOLAM HCL 2 MG/2ML IJ SOLN
INTRAMUSCULAR | Status: DC | PRN
  Administered 2023-08-19: 2 mg via INTRAVENOUS

## 2023-08-19 MED ORDER — ROCURONIUM BROMIDE 100 MG/10ML IV SOLN
INTRAVENOUS | Status: DC | PRN
  Administered 2023-08-19: 20 mg via INTRAVENOUS
  Administered 2023-08-19: 50 mg via INTRAVENOUS

## 2023-08-19 MED ORDER — GLYCOPYRROLATE 0.2 MG/ML IJ SOLN
INTRAMUSCULAR | Status: DC | PRN
  Administered 2023-08-19: .2 mg via INTRAVENOUS

## 2023-08-19 MED ORDER — FENTANYL CITRATE (PF) 100 MCG/2ML IJ SOLN
25.0000 ug | INTRAMUSCULAR | Status: DC | PRN
  Administered 2023-08-19: 50 ug via INTRAVENOUS
  Administered 2023-08-19 (×2): 25 ug via INTRAVENOUS

## 2023-08-19 MED ORDER — OXYCODONE HCL 5 MG PO TABS
5.0000 mg | ORAL_TABLET | ORAL | Status: DC | PRN
  Administered 2023-08-20 (×2): 5 mg via ORAL
  Filled 2023-08-19 (×2): qty 1

## 2023-08-19 MED ORDER — PROPOFOL 10 MG/ML IV BOLUS
INTRAVENOUS | Status: DC | PRN
  Administered 2023-08-19: 200 mg via INTRAVENOUS

## 2023-08-19 MED ORDER — ENOXAPARIN SODIUM 40 MG/0.4ML IJ SOSY
40.0000 mg | PREFILLED_SYRINGE | INTRAMUSCULAR | Status: DC
  Administered 2023-08-20: 40 mg via SUBCUTANEOUS
  Filled 2023-08-19: qty 0.4

## 2023-08-19 MED ORDER — PROMETHAZINE (PHENERGAN) 6.25MG IN NS 50ML IVPB
6.2500 mg | Freq: Once | INTRAVENOUS | Status: AC
  Administered 2023-08-19: 6.25 mg via INTRAVENOUS
  Filled 2023-08-19: qty 50
  Filled 2023-08-19: qty 6.25

## 2023-08-19 SURGICAL SUPPLY — 53 items
ANCHOR TIS RET SYS 235ML (MISCELLANEOUS) ×1 IMPLANT
BAG PRESSURE INF REUSE 1000 (BAG) IMPLANT
COVER TIP SHEARS 8 DVNC (MISCELLANEOUS) ×1 IMPLANT
COVER WAND RF STERILE (DRAPES) ×1 IMPLANT
DERMABOND ADVANCED .7 DNX12 (GAUZE/BANDAGES/DRESSINGS) ×1 IMPLANT
DRAIN CHANNEL JP 15F RND 3/16 (MISCELLANEOUS) IMPLANT
DRAPE ARM DVNC X/XI (DISPOSABLE) ×3 IMPLANT
DRAPE COLUMN DVNC XI (DISPOSABLE) ×1 IMPLANT
ELECT REM PT RETURN 9FT ADLT (ELECTROSURGICAL) ×1 IMPLANT
ELECTRODE REM PT RTRN 9FT ADLT (ELECTROSURGICAL) ×1 IMPLANT
EVACUATOR DRAINAGE 7X20 100CC (MISCELLANEOUS) IMPLANT
EVACUATOR SILICONE 100CC (DRAIN) IMPLANT
FORCEPS BPLR FENES DVNC XI (FORCEP) ×1 IMPLANT
GLOVE BIOGEL PI IND STRL 7.0 (GLOVE) ×2 IMPLANT
GLOVE SURG SYN 6.5 ES PF (GLOVE) ×2 IMPLANT
GLOVE SURG SYN 6.5 PF PI (GLOVE) ×2 IMPLANT
GOWN STRL REUS W/ TWL LRG LVL3 (GOWN DISPOSABLE) ×3 IMPLANT
GRASPER SUT TROCAR 14GX15 (MISCELLANEOUS) IMPLANT
IRRIGATOR SUCT 8 DISP DVNC XI (IRRIGATION / IRRIGATOR) IMPLANT
IV NS 1000ML BAXH (IV SOLUTION) IMPLANT
KIT TURNOVER KIT A (KITS) ×1 IMPLANT
LABEL OR SOLS (LABEL) IMPLANT
MANIFOLD NEPTUNE II (INSTRUMENTS) ×1 IMPLANT
NDL HYPO 22X1.5 SAFETY MO (MISCELLANEOUS) ×1 IMPLANT
NDL INSUFFLATION 14GA 120MM (NEEDLE) ×1 IMPLANT
NEEDLE HYPO 22X1.5 SAFETY MO (MISCELLANEOUS) ×1 IMPLANT
NEEDLE INSUFFLATION 14GA 120MM (NEEDLE) ×1 IMPLANT
OBTURATOR OPTICAL STND 8 DVNC (TROCAR) ×1 IMPLANT
OBTURATOR OPTICALSTD 8 DVNC (TROCAR) ×1 IMPLANT
PACK LAP CHOLECYSTECTOMY (MISCELLANEOUS) ×1 IMPLANT
RELOAD STAPLE 45 2.5 WHT DVNC (STAPLE) IMPLANT
RELOAD STAPLE 45 3.5 BLU DVNC (STAPLE) IMPLANT
RELOAD STAPLER 2.5X45 WHT DVNC (STAPLE) ×1 IMPLANT
RELOAD STAPLER 3.5X45 BLU DVNC (STAPLE) ×1 IMPLANT
SCISSORS MNPLR CVD DVNC XI (INSTRUMENTS) ×1 IMPLANT
SEAL UNIV 5-12 XI (MISCELLANEOUS) ×3 IMPLANT
SEALER VESSEL EXT DVNC XI (MISCELLANEOUS) IMPLANT
SET TUBE SMOKE EVAC HIGH FLOW (TUBING) ×1 IMPLANT
SOL ELECTROSURG ANTI STICK (MISCELLANEOUS) ×1 IMPLANT
SOLUTION ELECTROSURG ANTI STCK (MISCELLANEOUS) ×1 IMPLANT
STAPLER 45 SUREFORM DVNC (STAPLE) IMPLANT
STAPLER RELOAD 2.5X45 WHT DVNC (STAPLE) ×1 IMPLANT
STAPLER RELOAD 3.5X45 BLU DVNC (STAPLE) ×1 IMPLANT
SUT ETHILON 3-0 FS-10 30 BLK (SUTURE) IMPLANT
SUT MNCRL AB 4-0 PS2 18 (SUTURE) ×1 IMPLANT
SUT VIC AB 3-0 SH 27X BRD (SUTURE) IMPLANT
SUT VICRYL 0 UR6 27IN ABS (SUTURE) ×1 IMPLANT
SUTURE EHLN 3-0 FS-10 30 BLK (SUTURE) IMPLANT
SYR 30ML LL (SYRINGE) ×1 IMPLANT
SYSTEM WECK SHIELD CLOSURE (TROCAR) IMPLANT
TRAP FLUID SMOKE EVACUATOR (MISCELLANEOUS) ×1 IMPLANT
TRAY FOLEY MTR SLVR 16FR STAT (SET/KITS/TRAYS/PACK) ×1 IMPLANT
WATER STERILE IRR 500ML POUR (IV SOLUTION) ×1 IMPLANT

## 2023-08-19 NOTE — Discharge Instructions (Signed)
 Laparoscopic Appendectomy, Care After This sheet gives you information about how to care for yourself after your procedure. Your doctor may also give you more specific instructions. If you have problems or questions, contact your doctor. Follow these instructions at home: Care for cuts from surgery (incisions)  Follow instructions from your doctor about how to take care of your cuts from surgery. Make sure you: Wash your hands with soap and water before you change your bandage (dressing). If you cannot use soap and water, use hand sanitizer. Change your bandage as told by your doctor. Leave stitches (sutures), skin glue, or skin tape (adhesive) strips in place. They may need to stay in place for 2 weeks or longer. If tape strips get loose and curl up, you may trim the loose edges. Do not remove tape strips completely unless your doctor says it is okay. Do not take baths, swim, or use a hot tub until your doctor says it is okay. OK TO SHOWER 24HRS AFTER YOUR SURGERY.  Check your surgical cut area every day for signs of infection. Check for: More redness, swelling, or pain. More fluid or blood. Warmth. Pus or a bad smell. Activity Do not drive or use heavy machinery while taking prescription pain medicine. Do not play contact sports until your doctor says it is okay. Do not drive for 24 hours if you were given a medicine to help you relax (sedative). Rest as needed. Do not return to work or school until your doctor says it is okay. General instructions  tylenol and advil as needed for discomfort.  Please alternate between the two every four hours as needed for pain.    Use narcotics, if prescribed, only when tylenol and motrin is not enough to control pain.  325-650mg  every 8hrs to max of 3000mg /24hrs (including the 325mg  in every norco dose) for the tylenol.    Advil up to 800mg  per dose every 8hrs as needed for pain.   To prevent or treat constipation while you are taking prescription pain  medicine, your doctor may recommend that you: Drink enough fluid to keep your pee (urine) clear or pale yellow. Take over-the-counter or prescription medicines. Eat foods that are high in fiber, such as fresh fruits and vegetables, whole grains, and beans. Limit foods that are high in fat and processed sugars, such as fried and sweet foods. Contact a doctor if: You develop a rash. You have more redness, swelling, or pain around your surgical cuts. You have more fluid or blood coming from your surgical cuts. Your surgical cuts feel warm to the touch. You have pus or a bad smell coming from your surgical cuts. You have a fever. One or more of your surgical cuts breaks open. Get help right away if: You have trouble breathing. You have chest pain. You faint or feel dizzy when you stand. You have leg pain. This information is not intended to replace advice given to you by your health care provider. Make sure you discuss any questions you have with your health care provider. Document Released: 01/30/2008 Document Revised: 11/11/2015 Document Reviewed: 10/09/2015 Elsevier Interactive Patient Education  2019 [employer].

## 2023-08-19 NOTE — Plan of Care (Signed)

## 2023-08-19 NOTE — Anesthesia Procedure Notes (Addendum)
 Procedure Name: Intubation Date/Time: 08/19/2023 12:12 PM  Performed by: Niki Barter, CRNAPre-anesthesia Checklist: Patient identified, Emergency Drugs available, Suction available and Patient being monitored Patient Re-evaluated:Patient Re-evaluated prior to induction Oxygen Delivery Method: Circle system utilized Preoxygenation: Pre-oxygenation with 100% oxygen Induction Type: IV induction and Cricoid Pressure applied Ventilation: Mask ventilation without difficulty Laryngoscope Size: McGrath and 3 Grade View: Grade II Tube type: Oral Number of attempts: 1 Airway Equipment and Method: Stylet Placement Confirmation: ETT inserted through vocal cords under direct vision, positive ETCO2 and breath sounds checked- equal and bilateral Secured at: 21 cm Tube secured with: Tape Dental Injury: Teeth and Oropharynx as per pre-operative assessment  Difficulty Due To: Difficulty was anticipated and Difficult Airway- due to dentition Future Recommendations: Recommend- induction with short-acting agent, and alternative techniques readily available Comments: Atraumatic, pt with missing, cracked and loose teeth pre=op TFH CRNA

## 2023-08-19 NOTE — Progress Notes (Signed)
 Occupational Therapy Treatment Patient Details Name: Luis Hunter MRN: [id number] DOB: January 11, 1976 Today's Date: 08/19/2023   History of present illness Pt is a 48 y/o M presented on 08/15/23 with c/o R sided abdominal pain, diagnosed with appendicitis. Surgery planned but pt left AMA prior to this. Pt returned on 08/16/23 s/p seizure s/p benzo use. Pt also being treated for encephalopathy. Pt declining surgery for appendicitis at this time. PMH: seizure disorder, bipolar disorder, HTN, depression, [place] syndrome, polysubstance abuse   OT comments  Pt is supine in bed on arrival, asleep, but aroused with increased time and agreeable to PT/OT co-tx session to maximize pt/therapist safety. No outward pain notable, but pt continues with RLE instability during transfers/gait. Pt performed bed mobility with SUP. LB dressing to don shoes with CGA seated EOB and to pull pants over hips in standing. Pt required Min A x2 for safety throughout session to ambulate to bathroom, perform standing hand hygiene and mobility around nursing station with 2nd assist for IV pole management as pt remains with unsteady gait and impulsivity. Seated grooming tasks performed at EOB with set up assist. Assisted pt to call family member with his room phone with pt left seated EOB with sitter present. He is to go down for appendectomy at 11am. Left with all needs in place and will cont to require skilled acute OT services to maximize his safety and IND to return to PLOF.       If plan is discharge home, recommend the following:  A little help with walking and/or transfers;A little help with bathing/dressing/bathroom;Supervision due to cognitive status;Help with stairs or ramp for entrance   Equipment Recommendations  BSC/3in1    Recommendations for Other Services      Precautions / Restrictions Precautions Precautions: Fall Recall of Precautions/Restrictions: Impaired Precaution/Restrictions Comments:  seizures Restrictions Weight Bearing Restrictions Per Provider Order: No       Mobility Bed Mobility Overal bed mobility: Needs Assistance Bed Mobility: Supine to Sit, Sit to Supine     Supine to sit: Supervision Sit to supine: Supervision        Transfers Overall transfer level: Needs assistance Equipment used: None Transfers: Sit to/from Stand Sit to Stand: Contact guard assist           General transfer comment: Min A x2 for safety and IV pole management d/t pt's impuslivity and instability during gait     Balance Overall balance assessment: Needs assistance, History of Falls Sitting-balance support: Feet supported, No upper extremity supported Sitting balance-Leahy Scale: Good Sitting balance - Comments: during seated ADLs   Standing balance support: No upper extremity supported, During functional activity Standing balance-Leahy Scale: Poor Standing balance comment: Min A x2 d/t RLE instability                           ADL either performed or assessed with clinical judgement   ADL Overall ADL's : Needs assistance/impaired     Grooming: Oral care;Wash/dry face;Sitting;Set up;Wash/dry hands;Contact guard assist;Minimal assistance Grooming Details (indicate cue type and reason): seated EOB for oral care; Min/CGA for standing balance during hand washing             Lower Body Dressing: Contact guard assist;Sit to/from stand Lower Body Dressing Details (indicate cue type and reason): to maintain balance in standing to pull over hips Toilet Transfer: Minimal assistance;Regular Teacher, adult education Details (indicate cue type and reason): Min A to maintain balance while standing  at toilet; 2nd assist for IV pole management         Functional mobility during ADLs: Minimal assistance;Cueing for safety;+2 for safety/equipment General ADL Comments: Min A x2 for safety and IV pole management d/t pt's impuslivity and instability during gait     Extremity/Trunk Assessment              Vision       Perception     Praxis     Communication Communication Communication: Impaired Factors Affecting Communication: Reduced clarity of speech;Difficulty expressing self   Cognition Arousal: Alert Behavior During Therapy: Impulsive Cognition: Cognition impaired           Executive functioning impairment (select all impairments): Problem solving OT - Cognition Comments: unable to multi task or switch between 2 tasks. Cues to initiate ADLs                 Following commands: Impaired Following commands impaired: Follows one step commands with increased time      Cueing   Cueing Techniques: Verbal cues  Exercises      Shoulder Instructions       General Comments      Pertinent Vitals/ Pain       Pain Assessment Pain Assessment: No/denies pain  Home Living                                          Prior Functioning/Environment              Frequency  Min 3X/week        Progress Toward Goals  OT Goals(current goals can now be found in the care plan section)  Progress towards OT goals: Progressing toward goals  Acute Rehab OT Goals Patient Stated Goal: return home OT Goal Formulation: With patient Time For Goal Achievement: 09/01/23 Potential to Achieve Goals: Good  Plan      Co-evaluation                 AM-PAC OT "6 Clicks" Daily Activity     Outcome Measure   Help from another person eating meals?: None Help from another person taking care of personal grooming?: A Little Help from another person toileting, which includes using toliet, bedpan, or urinal?: A Little Help from another person bathing (including washing, rinsing, drying)?: A Little Help from another person to put on and taking off regular upper body clothing?: None Help from another person to put on and taking off regular lower body clothing?: A Little 6 Click Score: 20    End of Session  Equipment Utilized During Treatment: Gait belt  OT Visit Diagnosis: Other abnormalities of gait and mobility (R26.89);Muscle weakness (generalized) (M62.81)   Activity Tolerance Patient tolerated treatment well   Patient Left with call bell/phone within reach;in bed;with nursing/sitter in room   Nurse Communication Mobility status        Time: 1610-9604 OT Time Calculation (min): 27 min  Charges: OT General Charges $OT Visit: 1 Visit OT Treatments $Self Care/Home Management : 8-22 mins  Luis Hunter, OTR/L  08/19/23, 12:53 PM   Luis Hunter Luis Hunter Luis Hunter 08/19/2023, 12:49 PM

## 2023-08-19 NOTE — Interval H&P Note (Signed)
 No change. Patience still wishes to proceed.

## 2023-08-19 NOTE — Transfer of Care (Signed)
 Immediate Anesthesia Transfer of Care Note  Patient: Luis Hunter  Procedure(s) Performed: APPENDECTOMY, ROBOT-ASSISTED, LAPAROSCOPIC  Patient Location: PACU  Anesthesia Type:General  Level of Consciousness: sedated  Airway & Oxygen Therapy: Patient Spontanous Breathing and Patient connected to face mask oxygen  Post-op Assessment: Report given to RN and Post -op Vital signs reviewed and stable  Post vital signs: Reviewed and stable  Last Vitals:  Vitals Value Taken Time  BP 135/94 08/19/23 1329  Temp 36.7 C 08/19/23 1329  Pulse 79 08/19/23 1329  Resp 24 08/19/23 1329  SpO2 100 % 08/19/23 1329    Last Pain:  Vitals:   08/19/23 1329  TempSrc: Temporal  PainSc:          Complications:  Encounter Notable Events  Notable Event Outcome Phase Comment  Difficult to intubate - expected  Intraprocedure Filed from anesthesia note documentation.

## 2023-08-19 NOTE — Progress Notes (Signed)
 Physical Therapy Treatment Patient Details Name: Luis Hunter MRN: [id number] DOB: 1975/07/11 Today's Date: 08/19/2023   History of Present Illness Pt is a 48 y/o M presented on 08/15/23 with c/o R sided abdominal pain, diagnosed with appendicitis. Surgery planned but pt left AMA prior to this. Pt returned on 08/16/23 s/p seizure s/p benzo use. Pt also being treated for encephalopathy. Pt declining surgery for appendicitis at this time. PMH: seizure disorder, bipolar disorder, HTN, depression, [place] syndrome, polysubstance abuse    PT Comments  PT/OT co-treatment performed this date due to need for safety. Min assist +2 for all mobility with unsteadiness noted. Recommend soft knee brace for support in addition to therapy. Still remains impulsive and has limited insight to situation. ADLs and standing balance performed. Will continue to progress. Of note, pt scheduled for procedure this date. Will need orders for further participation post op.     If plan is discharge home, recommend the following: A little help with walking and/or transfers;Assistance with cooking/housework;A little help with bathing/dressing/bathroom;Assist for transportation;Help with stairs or ramp for entrance;Direct supervision/assist for financial management;Supervision due to cognitive status   Can travel by private vehicle        Equipment Recommendations  None recommended by PT    Recommendations for Other Services       Precautions / Restrictions Precautions Precautions: Fall Recall of Precautions/Restrictions: Impaired Precaution/Restrictions Comments: seizures Restrictions Weight Bearing Restrictions Per Provider Order: No     Mobility  Bed Mobility Overal bed mobility: Needs Assistance Bed Mobility: Supine to Sit, Sit to Supine     Supine to sit: Supervision Sit to supine: Supervision   General bed mobility comments: takes extended time to perform    Transfers Overall transfer level: Needs  assistance Equipment used: None Transfers: Sit to/from Stand Sit to Stand: Contact guard assist           General transfer comment: impulsive and needs cues for sequencing. Once standing RW used    Ambulation/Gait Ambulation/Gait assistance: Min assist, +2 safety/equipment Gait Distance (Feet): 200 Feet Assistive device: None Gait Pattern/deviations: Decreased step length - right, Decreased step length - left, Decreased stride length       General Gait Details: head down throughout gait. INtermittent R knee instability leading to buckling with min assist for correction. Cues for safety   Stairs             Wheelchair Mobility     Tilt Bed    Modified Rankin (Stroke Patients Only)       Balance Overall balance assessment: Needs assistance, History of Falls Sitting-balance support: Feet supported, No upper extremity supported Sitting balance-Leahy Scale: Good     Standing balance support: No upper extremity supported, During functional activity Standing balance-Leahy Scale: Poor                              Communication Communication Communication: Impaired Factors Affecting Communication: Reduced clarity of speech;Difficulty expressing self  Cognition Arousal: Alert Behavior During Therapy: Impulsive   PT - Cognitive impairments: Awareness, Safety/Judgement, Problem solving                       PT - Cognition Comments: decreased safety awareness. Appears with calm affect. Asking several times to call family members Following commands: Impaired Following commands impaired: Follows one step commands with increased time    Cueing Cueing Techniques: Verbal cues  Exercises Other  Exercises Other Exercises: able to ambulate to bathroom and perform seated ADLs with OT including dressing, donning shoes and teeth brushing. Other Exercises: standing balance activities performed including heel raises, toe raises, and squats. Cues for  correct technique. 10 reps with cga    General Comments        Pertinent Vitals/Pain Pain Assessment Pain Assessment: No/denies pain    Home Living                          Prior Function            PT Goals (current goals can now be found in the care plan section) Acute Rehab PT Goals Patient Stated Goal: go home PT Goal Formulation: With patient Time For Goal Achievement: 09/01/23 Potential to Achieve Goals: Good Progress towards PT goals: Progressing toward goals    Frequency    Min 3X/week      PT Plan      Co-evaluation PT/OT/SLP Co-Evaluation/Treatment: Yes Reason for Co-Treatment: For patient/therapist safety;Necessary to address cognition/behavior during functional activity PT goals addressed during session: Mobility/safety with mobility;Balance OT goals addressed during session: ADL's and self-care      AM-PAC PT "6 Clicks" Mobility   Outcome Measure  Help needed turning from your back to your side while in a flat bed without using bedrails?: A Little Help needed moving from lying on your back to sitting on the side of a flat bed without using bedrails?: A Little Help needed moving to and from a bed to a chair (including a wheelchair)?: A Little Help needed standing up from a chair using your arms (e.g., wheelchair or bedside chair)?: A Little Help needed to walk in hospital room?: A Little Help needed climbing 3-5 steps with a railing? : A Lot 6 Click Score: 17    End of Session Equipment Utilized During Treatment: Gait belt Activity Tolerance: Patient tolerated treatment well Patient left: in bed (with sitter at bedside) Nurse Communication: Mobility status PT Visit Diagnosis: Muscle weakness (generalized) (M62.81);Unsteadiness on feet (R26.81);Other abnormalities of gait and mobility (R26.89);History of falling (Z91.81);Difficulty in walking, not elsewhere classified (R26.2)     Time: 9528-4132 PT Time Calculation (min) (ACUTE  ONLY): 28 min  Charges:    $Gait Training: 8-22 mins PT General Charges $$ ACUTE PT VISIT: 1 Visit                     Amparo Balk, PT, DPT, GCS [phone]    Yanitza Shvartsman 08/19/2023, 2:08 PM

## 2023-08-19 NOTE — Anesthesia Preprocedure Evaluation (Signed)
 Anesthesia Evaluation  Patient identified by MRN, date of birth, ID band Patient awake    Reviewed: Allergy & Precautions, H&P , NPO status , Patient's Chart, lab work & pertinent test results, reviewed documented beta blocker date and time   History of Anesthesia Complications Negative for: history of anesthetic complications  Airway Mallampati: I  TM Distance: >3 FB Neck ROM: full    Dental  (+) Dental Advidsory Given, Caps, Missing, Poor Dentition   Pulmonary neg shortness of breath, neg COPD, neg recent URI, Current Smoker and Patient abstained from smoking.   Pulmonary exam normal breath sounds clear to auscultation       Cardiovascular Exercise Tolerance: Good hypertension, (-) angina + Past MI  (-) Cardiac Stents Normal cardiovascular exam(-) dysrhythmias (-) Valvular Problems/Murmurs Rhythm:regular Rate:Normal     Neuro/Psych Seizures -, Well Controlled,  PSYCHIATRIC DISORDERS  Depression Bipolar Disorder Schizophrenia     GI/Hepatic Neg liver ROS,GERD  ,,  Endo/Other  negative endocrine ROS    Renal/GU negative Renal ROS  negative genitourinary   Musculoskeletal   Abdominal   Peds  Hematology negative hematology ROS (+)   Anesthesia Other Findings Past Medical History: No date: Bipolar disorder (HCC) No date: Depression No date: Schizophrenia (HCC) No date: Seizures (HCC)   Reproductive/Obstetrics negative OB ROS                             Anesthesia Physical Anesthesia Plan  ASA: 3  Anesthesia Plan: General   Post-op Pain Management:    Induction: Intravenous  PONV Risk Score and Plan: 1 and Ondansetron, Dexamethasone, Midazolam and Treatment may vary due to age or medical condition  Airway Management Planned: Oral ETT  Additional Equipment:   Intra-op Plan:   Post-operative Plan: Extubation in OR  Informed Consent: I have reviewed the patients History and  Physical, chart, labs and discussed the procedure including the risks, benefits and alternatives for the proposed anesthesia with the patient or authorized representative who has indicated his/her understanding and acceptance.     Dental Advisory Given  Plan Discussed with: Anesthesiologist, CRNA and Surgeon  Anesthesia Plan Comments:        Anesthesia Quick Evaluation

## 2023-08-19 NOTE — Op Note (Signed)
 Preoperative diagnosis: acute appendicitis  Postoperative diagnosis: Same  Procedure: Robotic assisted laparoscopic appendectomy.  Anesthesia: GETA  Surgeon: Conrado Delay  Wound Classification: clean contaminated  Specimen: Appendix  Complications: None  Estimated Blood Loss: 30 mL   Indications: Patient is a 48 y.o. male  presented with above.  Please see H&P for further details.    FIndings: 1.  Irritated appendix  2. No peri-appendiceal abscess or phlegmon 3. Normal anatomy 4. Appendiceal artery ligated and divided with stapler 5. Adequate hemostasis.   Description of procedure: The patient was placed on the operating table in the supine position, left arm tucked. General anesthesia was induced. A time-out was completed verifying correct patient, procedure, site, positioning, and implant(s) and/or special equipment prior to beginning this procedure. The abdomen was prepped and draped in the usual sterile fashion.   Palmer's point located and Veress needle was inserted.  After confirming 2 clicks and a positive saline drop test, gas insufflation was initiated until the abdominal pressure was measured at 15 mmHg.  Afterwards, the Veress needle was removed and a 8 mm port was placed through a periumbilical site using Optiview technique after incision with an 11 blade.  After local was infused, 2 additional incision was made 8 cm apart each side of the abdominal wall from the initial incision.  An 8 mm port to the left and 12mm port to the right from initial incision, both under direct visualization.  No injuries from trocar placements were noted. The table was placed in the Trendelenburg position. Xi robotic platform was then brought to the operative field and docked.  An inflamed appendix was identified and elevated.  Infection was present within the abdominal cavity due to appendicitis. Window created at base of appendix in the mesentery.   A blue load linear cutting stapler was  then used to divide and staple the base of the appendix. It was reloaded with a vascular cartridge and the mesoappendix similarly divided.  bleeding from the staple lines controlled with cautery.  The appendix was placed in an endoscopic retrieval bag and removed.   The appendiceal stump and mesoappendix staple line examined again and hemostasis noted. No other pathology was identified within pelvis. The 12 mm trocar removed and port site closed with PMI using 0 vicryl under direct vision. Remaining trocars were removed under direct vision. No bleeding was noted.The abdomen was allowed to collapse. All skin incisions then closed with subcuticular sutures Monocryl 4-0.  Wounds then dressed with dermabond.  The patient tolerated the procedure well, awakened from anesthesia and was taken to the postanesthesia care unit in satisfactory condition.  Sponge count and instrument count correct at the end of the procedure.

## 2023-08-19 NOTE — Progress Notes (Signed)
 Progress Note   Patient: Luis Hunter NUU:[id number] DOB: Apr 16, 1976 DOA: 08/16/2023     2 DOS: the patient was seen and examined on 08/19/2023   Brief hospital course: 48 y.o. male with medical history significant of seizure disorder, however per syndrome, bipolar disorder, hypertension presenting with seizure, appendicitis.  Limited history in setting of postictal state status post benzo use.  Patient noted to have been evaluated yesterday for right sided abdominal pain.  Formally diagnosed with appendicitis.  General surgery consulted with recommendations including suregery. However, patient was only agreeable to antibiotics and left the hospital AGAINST MEDICAL ADVICE.  Per report, patient with witnessed GTC seizure at home by family overnight. Baseline history of chronic seizure.  No reported recent medication changes.  No reported illicit drug use. Presented to the ER afebrile, hemodynamically stable.  Satting well on room air.  Required multiple doses of Ativan as well as Valium to help with seizure event.  Status post IV Keppra load.  4/13.  Family concerned that mental status not back to baseline.  Patient wants to leave but is willing to stay secondary to family asking him to stay.  Will get psychiatry evaluation and also neurology evaluation.  On IV Zosyn for appendicitis. 4/14.  Patient had a fall climbing over the rail.  Has a send left knee pain but right shoulder okay.  Patient asked me a lot of questions about the surgery for his appendix.  Patient now agreeable. 4/15.  Patient seen this morning prior to operating room for appendicitis  Assessment and Plan: * Appendicitis Noted recent diagnosis of appendicitis with recent surgical evaluation and patient left hospital AGAINST MEDICAL ADVICE Continue IV Zosyn for now Dr. Rosea Conch took to the operating room on 4/15 for robotic assisted laparoscopic appendectomy.  Seizure (HCC) [place] Syndrome Witnessed breakthrough seizure with  persistence of symptoms on presentation to the ER Status post IV Keppra load Continue home Keppra and increase Depakote dose. Appreciate neurology consultation.  Acute metabolic encephalopathy Back to baseline mental status  Schizophrenia (HCC) Continue home regimen including abilify and cogentin .  Appreciate psychiatric consultation  Polysubstance abuse (HCC) Urine drug screen positive for benzodiazepine and cannabis.  Cannabis can increase risk of seizure.  Likely received benzodiazepine in ER prior to urine toxicology being sent off.  Essential hypertension On losartan  Thrombocytopenia (HCC) Could be secondary to infection but looking back he has had previous times where his platelet counts have been low.  Hepatitis C nonreactive.  Today's platelet count 109.  Left knee pain Patient has a bruise medial left knee.  Does not show any fracture.  Right shoulder x-ray does not show any fracture either.  AKI (acute kidney injury) (HCC) Creatinine 0.84 on 4/11.  On 9/12 up at 1.24.  Last creatinine 0.92.  Visual hallucinations Resolved  Lactic acidosis Secondary to seizure not sepsis.        Subjective: Patient seen this morning prior to surgery for appendicitis.  Asked nursing staff to give medications for seizure this morning.  Admitted with seizure.  Also had appendicitis.  Physical Exam: Vitals:   08/19/23 1419 08/19/23 1430 08/19/23 1445 08/19/23 1500  BP: (!) 184/112 (!) 169/108 (!) 164/104 (!) 146/103  Pulse: 90 96 80 88  Resp:  16  14  Temp:    97.8 F (36.6 C)  TempSrc:      SpO2: 99% 98% 93% 94%  Weight:      Height:       Physical Exam HENT:  Head: Normocephalic.     Mouth/Throat:     Pharynx: No oropharyngeal exudate.  Eyes:     General: Lids are normal.     Conjunctiva/sclera: Conjunctivae normal.  Cardiovascular:     Rate and Rhythm: Normal rate and regular rhythm.     Heart sounds: Normal heart sounds, S1 normal and S2 normal.  Pulmonary:      Breath sounds: No decreased breath sounds, wheezing, rhonchi or rales.  Abdominal:     Palpations: Abdomen is soft.     Tenderness: There is no abdominal tenderness.  Musculoskeletal:     Right lower leg: No swelling.     Left lower leg: No swelling.     Comments: Bruising medial right knee.  Good range of motion.  Skin:    General: Skin is warm.     Findings: No rash.  Neurological:     Mental Status: He is alert.     Comments: Answers questions.     Data Reviewed: No new data today  Family Communication: Updated sister on the phone  Disposition: Status is: Inpatient Remains inpatient appropriate because: OR today for appendicitis  Planned Discharge Destination: Home    Time spent: 28 minutes Case discussed with general surgery after the procedure.  Author: Verla Glaze, MD 08/19/2023 3:15 PM  For on call review [email or web address].

## 2023-08-20 ENCOUNTER — Other Ambulatory Visit: Payer: Self-pay

## 2023-08-20 ENCOUNTER — Encounter: Payer: Self-pay | Admitting: Surgery

## 2023-08-20 DIAGNOSIS — K3580 Unspecified acute appendicitis: Secondary | ICD-10-CM | POA: Diagnosis not present

## 2023-08-20 DIAGNOSIS — F209 Schizophrenia, unspecified: Secondary | ICD-10-CM | POA: Diagnosis not present

## 2023-08-20 DIAGNOSIS — R569 Unspecified convulsions: Secondary | ICD-10-CM | POA: Diagnosis not present

## 2023-08-20 DIAGNOSIS — I10 Essential (primary) hypertension: Secondary | ICD-10-CM | POA: Diagnosis not present

## 2023-08-20 LAB — CBC
HCT: 41.7 % (ref 39.0–52.0)
Hemoglobin: 14.2 g/dL (ref 13.0–17.0)
MCH: 29.2 pg (ref 26.0–34.0)
MCHC: 34.1 g/dL (ref 30.0–36.0)
MCV: 85.6 fL (ref 80.0–100.0)
Platelets: 125 10*3/uL — ABNORMAL LOW (ref 150–400)
RBC: 4.87 MIL/uL (ref 4.22–5.81)
RDW: 15.8 % — ABNORMAL HIGH (ref 11.5–15.5)
WBC: 9.8 10*3/uL (ref 4.0–10.5)
nRBC: 0 % (ref 0.0–0.2)

## 2023-08-20 LAB — BASIC METABOLIC PANEL WITH GFR
Anion gap: 7 (ref 5–15)
BUN: 23 mg/dL — ABNORMAL HIGH (ref 6–20)
CO2: 25 mmol/L (ref 22–32)
Calcium: 9.1 mg/dL (ref 8.9–10.3)
Chloride: 101 mmol/L (ref 98–111)
Creatinine, Ser: 1.21 mg/dL (ref 0.61–1.24)
GFR, Estimated: >60 mL/min (ref >60)
Glucose, Bld: 97 mg/dL (ref 70–99)
Potassium: 4.4 mmol/L (ref 3.5–5.1)
Sodium: 133 mmol/L — ABNORMAL LOW (ref 135–145)

## 2023-08-20 LAB — SURGICAL PATHOLOGY

## 2023-08-20 MED ORDER — OXYCODONE-ACETAMINOPHEN 5-325 MG PO TABS
1.0000 | ORAL_TABLET | Freq: Three times a day (TID) | ORAL | 0 refills | Status: AC | PRN
  Filled 2023-08-20: qty 6, 2d supply, fill #0

## 2023-08-20 MED ORDER — DOCUSATE SODIUM 100 MG PO CAPS
100.0000 mg | ORAL_CAPSULE | Freq: Two times a day (BID) | ORAL | 0 refills | Status: AC | PRN
  Filled 2023-08-20: qty 20, 10d supply, fill #0

## 2023-08-20 MED ORDER — ACETAMINOPHEN 325 MG PO TABS
650.0000 mg | ORAL_TABLET | Freq: Three times a day (TID) | ORAL | 0 refills | Status: AC | PRN
Start: 2023-08-20 — End: 2023-09-19
  Filled 2023-08-20: qty 40, 7d supply, fill #0

## 2023-08-20 MED ORDER — IBUPROFEN 800 MG PO TABS
800.0000 mg | ORAL_TABLET | Freq: Three times a day (TID) | ORAL | 0 refills | Status: AC | PRN
  Filled 2023-08-20: qty 30, 10d supply, fill #0

## 2023-08-20 NOTE — Progress Notes (Signed)
 Subjective:  CC: Luis Hunter is a 48 y.o. male  Hospital stay day 3, 1 Day Post-Op robotic lap appy for acute appendicitis  HPI: No issues reported overnight.  ROS:  Pertinent positives and negatives noted in the HPI.    Objective:   Temp:  [97.8 F (36.6 C)-98.9 F (37.2 C)] 97.8 F (36.6 C) (04/16 0651) Pulse Rate:  [61-96] 61 (04/16 0651) Resp:  [14-24] 16 (04/16 0651) BP: (115-184)/(85-116) 119/88 (04/16 0651) SpO2:  [93 %-100 %] 100 % (04/16 0651) Weight:  [86.6 kg] 86.6 kg (04/15 1050)     Height: 5\' 11"  (180.3 cm) Weight: 86.6 kg BMI (Calculated): 26.64   Intake/Output this shift:   Intake/Output Summary (Last 24 hours) at 08/20/2023 0731 Last data filed at 08/20/2023 0600 Gross per 24 hour  Intake 500 ml  Output 915 ml  Net -415 ml    Constitutional :  alert, cooperative, appears stated age, and no distress  Respiratory:  Clear to auscultation bilaterally  Cardiovascular:  Regular rate and rhythm  Gastrointestinal: soft, non-tender; bowel sounds normal; no masses,  no organomegaly.   Skin: Cool and moist. Incisions clean dry intact  Psychiatric: Normal affect, non-agitated, not confused       LABS:     Latest Ref Rng & Units 08/20/2023    2:57 AM 08/17/2023    3:03 AM 08/16/2023    7:19 AM  CMP  Glucose 70 - 99 mg/dL 97  99  60   BUN 6 - 20 mg/dL 23  14  18    Creatinine 0.61 - 1.24 mg/dL 1.61  0.96  0.45   Sodium 135 - 145 mmol/L 133  139  137   Potassium 3.5 - 5.1 mmol/L 4.4  3.6  3.5   Chloride 98 - 111 mmol/L 101  108  100   CO2 22 - 32 mmol/L 25  25  18    Calcium 8.9 - 10.3 mg/dL 9.1  8.1  8.8   Total Protein 6.5 - 8.1 g/dL  5.5  7.2   Total Bilirubin 0.0 - 1.2 mg/dL  0.8  1.0   Alkaline Phos 38 - 126 U/L  43  46   AST 15 - 41 U/L  30  41   ALT 0 - 44 U/L  25  29       Latest Ref Rng & Units 08/20/2023    2:57 AM 08/18/2023    5:55 AM 08/17/2023    3:03 AM  CBC  WBC 4.0 - 10.5 K/uL 9.8  8.5  12.1   Hemoglobin 13.0 - 17.0 g/dL 40.9  81.1   91.4   Hematocrit 39.0 - 52.0 % 41.7  39.9  37.4   Platelets 150 - 400 K/uL 125  109  91     RADS: N/a Assessment:   S/p robotic lap appy for acute appendicitis Doing well.  Regular diet. Ok to d/c once tolerating f/u two weeks in office for post op check  labs/images/medications/previous chart entries reviewed personally and relevant changes/updates noted above.

## 2023-08-20 NOTE — Care Management Important Message (Deleted)
 Important Message  Patient Details  Name: Luis Hunter MRN: [id number] Date of Birth: 12-05-1975   Important Message Given:        Brookie Cantor, CMA 08/20/2023, 11:45 AM

## 2023-08-20 NOTE — Care Management Important Message (Deleted)
 Important Message  Patient Details  Name: Luis Hunter MRN: [id number] Date of Birth: 1975-12-21   Important Message Given:        Brookie Cantor, CMA 08/20/2023, 11:32 AM

## 2023-08-20 NOTE — Discharge Summary (Addendum)
 Physician Discharge Summary   Patient: Luis Hunter MRN: [id number] DOB: August 27, 1975  Admit date:     08/16/2023  Discharge date: 08/20/2023  Discharge Physician: Montey Apa   PCP: [facility]   Recommendations at discharge:    Follow up as scheduled with [facility] Follow up with Primary Care in 1-2 weeks Repeat CBC, CMP at follow up  Discharge Diagnoses: Principal Problem:   Appendicitis Active Problems:   Seizure (HCC)   Acute metabolic encephalopathy   Schizophrenia (HCC)   Polysubstance abuse (HCC)   Essential hypertension   Thrombocytopenia (HCC)   Lactic acidosis   Visual hallucinations   AKI (acute kidney injury) (HCC)   Left knee pain  Resolved Problems:   * No resolved hospital problems. *  Hospital Course:  48 y.o. male with medical history significant of seizure disorder, however per syndrome, bipolar disorder, hypertension presenting with seizure, appendicitis.  Limited history in setting of postictal state status post benzo use.  Patient noted to have been evaluated yesterday for right sided abdominal pain.  Formally diagnosed with appendicitis.  General surgery consulted with recommendations including suregery. However, patient was only agreeable to antibiotics and left the hospital AGAINST MEDICAL ADVICE.  Per report, patient with witnessed GTC seizure at home by family overnight. Baseline history of chronic seizure.  No reported recent medication changes.  No reported illicit drug use. Presented to the ER afebrile, hemodynamically stable.  Satting well on room air.  Required multiple doses of Ativan  as well as Valium  to help with seizure event.  Status post IV Keppra  load.   4/13.  Family concerned that mental status not back to baseline.  Patient wants to leave but is willing to stay secondary to family asking him to stay.  Will get psychiatry evaluation and also neurology evaluation.  On IV Zosyn  for appendicitis. 4/14.   Patient had a fall climbing over the rail.  Has a send left knee pain but right shoulder okay.  Patient asked me a lot of questions about the surgery for his appendix.  Patient now agreeable. 4/15.  Patient seen this morning prior to operating room for appendicitis  4/16 - I assumed care this AM.  Notified by Surgeon that patient stable for discharge and outpatient follow up.  Pt seen and examined, denies any complaints, requests discharge home.  Tolerating diet. Medically stable for d/c.   Assessment and Plan:  Appendicitis Noted recent diagnosis of appendicitis with recent surgical evaluation and patient left hospital AGAINST MEDICAL ADVICE Treated with empiric IV Zosyn   Dr. Rosea Conch took to the operating room on 4/15 for robotic assisted laparoscopic appendectomy. Follow up in 2 weeks in Surgery clinic   Seizure [facility]) [place] Syndrome Witnessed breakthrough seizure with persistence of symptoms on presentation to the ER Status post IV Keppra  load --Continue home Keppra  and increase Depakote  dose. --Appreciate neurology consultation.   Acute metabolic encephalopathy - suspect post-ictal due to seizure.  Unrelated to appendicitis. Back to baseline mental status   Schizophrenia (HCC) Continue home regimen including abilify  and cogentin  .  Appreciate psychiatric consultation   Polysubstance abuse (HCC) Urine drug screen positive for benzodiazepine and cannabis.  Cannabis can increase risk of seizure.  Likely received benzodiazepine in ER prior to urine toxicology being sent off.   Essential hypertension --On losartan    Thrombocytopenia (HCC) Could be secondary to infection but looking back he has had previous times where his platelet counts have been low.  Hepatitis C nonreactive.  Today's platelet count 109.   Left knee pain Patient has a bruise medial left knee.  Does not show any fracture.  Right shoulder x-ray does not show any fracture either.   AKI (acute kidney injury)  (HCC) Creatinine 0.84 on 4/11.  On 9/12 up at 1.24.  Last creatinine 0.92.   Visual hallucinations Resolved   Lactic acidosis Secondary to seizure not sepsis       Consultants: Surgery Procedures performed: Appendectomy  Disposition: Home Diet recommendation:  Regular diet DISCHARGE MEDICATION: Allergies as of 08/20/2023       Reactions   Lacosamide     EOSINOPHILIA   Lacosamide  Other (See Comments)   Eosinophilia   Valproic Acid  And Related    hyperammonemia   Valproic Acid  And Related Other (See Comments)   hyperannonemia   Lamictal  [lamotrigine ] Rash   Mirtazapine  Rash        Medication List     STOP taking these medications    Capsaicin  0.1 % Crea   triamcinolone cream 0.5 % Commonly known as: KENALOG   vitamin D3 25 MCG tablet Commonly known as: CHOLECALCIFEROL        TAKE these medications    acetaminophen  325 MG tablet Commonly known as: Tylenol  Take 2 tablets (650 mg total) by mouth every 8 (eight) hours as needed for mild pain (pain score 1-3).   ARIPiprazole  ER 960 MG/3.2ML Prsy Inject 960 mg into the muscle every 28 (twenty-eight) days.   benztropine  0.5 MG tablet Commonly known as: COGENTIN  Take 2 tablets (1 mg total) by mouth 2 (two) times daily.   divalproex  500 MG DR tablet Commonly known as: DEPAKOTE  Take 500 mg by mouth 2 (two) times daily.   docusate sodium  100 MG capsule Commonly known as: Colace Take 1 capsule (100 mg total) by mouth 2 (two) times daily as needed for up to 10 days for mild constipation.   famotidine  20 MG tablet Commonly known as: PEPCID  Take 1 tablet (20 mg total) by mouth 2 (two) times daily.   ibuprofen  800 MG tablet Commonly known as: ADVIL  Take 1 tablet (800 mg total) by mouth every 8 (eight) hours as needed for mild pain (pain score 1-3) or moderate pain (pain score 4-6).   lansoprazole  30 MG capsule Commonly known as: Prevacid  Take 1 capsule (30 mg total) by mouth daily.   levETIRAcetam  750  MG tablet Commonly known as: KEPPRA  Take 750 mg by mouth 2 (two) times daily.   losartan  50 MG tablet Commonly known as: COZAAR  Take 1 tablet (50 mg total) by mouth daily.   oxyCODONE -acetaminophen  5-325 MG tablet Commonly known as: Percocet Take 1 tablet by mouth every 8 (eight) hours as needed for severe pain (pain score 7-10).        Follow-up Information     [facility] Go on 09/04/2023.   Specialty: General Practice Why: @10 :00am Contact information: [address] [place] [address] [phone]         Conrado Delay, DO. Schedule an appointment as soon as possible for a visit in 2 week(s).   Specialties: General Surgery, Surgery Why: post op appy Contact information: [address] [place] [address] [phone]                Discharge Exam: Filed Weights   08/16/23 0712 08/16/23 1330 08/19/23 1050  Weight: 92.5 kg 86.6 kg 86.6 kg   General exam: awake, alert, no acute distress Respiratory system: CTAB, no wheezes, rales or  rhonchi, normal respiratory effort. Cardiovascular system: normal S1/S2, RRR, no JVD, murmurs, rubs, gallops, no pedal edema.   Gastrointestinal system: soft, normal post-op tenderness, non-distended, +bowel sounds. Central nervous system: A&O x 3. no gross focal neurologic deficits, normal speech Extremities: moves all, no edema, normal tone Skin: dry, intact, normal temperature Psychiatry: normal mood, congruent affect, judgement and insight appear normal   Condition at discharge: stable  The results of significant diagnostics from this hospitalization (including imaging, microbiology, ancillary and laboratory) are listed below for reference.   Imaging Studies: DG Shoulder Right Result Date: 08/18/2023 CLINICAL DATA:  Pain. EXAM: RIGHT SHOULDER - 2+ VIEW COMPARISON:  CT cervical spine 08/10/2021 FINDINGS: Moderate glenohumeral joint space narrowing with moderate inferior  glenoid and humeral head-neck junction degenerative osteophytosis. There are two oval well corticated chronic loose bodies, each measuring up to approximately 14 mm, overlying the inferior glenohumeral joint/axillary pouch. The humeral head is mildly high-riding. Mild clavicular joint space narrowing and peripheral osteophytosis. On Gregoria Leas view there appears to be moderate inferior spurring off the acromion. No acute fracture is seen.  No dislocation. On oblique view of the cervical spine, there is moderate uncovertebral and facet joint spurring causing C3-4 neuroforaminal narrowing, seen to be within the left C3-4 neural foramen on prior CT. IMPRESSION: 1. 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 2. Two chronic loose bodies overlying the inferior glenohumeral joint/axillary pouch. 3. Moderate inferior spurring off the acromion. Electronically Signed   By: Bertina Broccoli M.D.   On: 08/18/2023 12:02   DG Knee 1-2 Views Left Result Date: 08/18/2023 CLINICAL DATA:  Pain. EXAM: LEFT KNEE - 1-2 VIEW COMPARISON:  Left knee radiographs 01/19/2016 FINDINGS: No evidence of fracture, dislocation, or joint effusion. No evidence of arthropathy. Minimal chronic enthesopathic change at the quadriceps insertion on the patella. Soft tissues are unremarkable. IMPRESSION: Minimal chronic enthesopathic change at the quadriceps insertion on the patella. Electronically Signed   By: Bertina Broccoli M.D.   On: 08/18/2023 11:59   CT HEAD WO CONTRAST ( ) Result Date: 08/16/2023 CLINICAL DATA:  Seizure and head trauma EXAM: CT HEAD WITHOUT CONTRAST TECHNIQUE: Contiguous axial images were obtained from the base of the skull through the vertex without intravenous contrast. RADIATION DOSE REDUCTION: This exam was performed according to the departmental dose-optimization program which includes automated exposure control, adjustment of the mA and/or kV according to patient size and/or use of iterative reconstruction  technique. COMPARISON:  04/13/2023 FINDINGS: Brain: No evidence of acute infarction, hemorrhage, hydrocephalus, extra-axial collection or mass lesion/mass effect. Vascular: No hyperdense vessel or unexpected calcification. Skull: Normal. Negative for fracture or focal lesion. Sinuses/Orbits: No acute finding. IMPRESSION: No evidence of intracranial injury.  Stable head CT. Electronically Signed   By: Ronnette Coke M.D.   On: 08/16/2023 08:59   DG Chest Port 1 View Result Date: 08/16/2023 CLINICAL DATA:  Questionable sepsis. EXAM: PORTABLE CHEST 1 VIEW COMPARISON:  08/15/2023 FINDINGS: The lungs are clear without focal pneumonia, edema, pneumothorax or pleural effusion. The cardiopericardial silhouette is within normal limits for size. No acute bony abnormality. Telemetry leads overlie the chest. IMPRESSION: No active disease. Electronically Signed   By: Donnal Fusi M.D.   On: 08/16/2023 08:32   CT ABDOMEN PELVIS W CONTRAST Result Date: 08/15/2023 CLINICAL DATA:  Abdominal pain, acute nonlocalized EXAM: CT ABDOMEN AND PELVIS WITH CONTRAST TECHNIQUE: Multidetector CT imaging of the abdomen and pelvis was performed using the standard protocol following bolus administration of intravenous contrast. RADIATION DOSE REDUCTION: This exam was performed  according to the departmental dose-optimization program which includes automated exposure control, adjustment of the mA and/or kV according to patient size and/or use of iterative reconstruction technique. CONTRAST:  OMNIPAQUE  IOHEXOL  300 MG/ML  SOLN COMPARISON:  None Available. FINDINGS: Lower chest: Lung bases are clear. Hepatobiliary: No focal hepatic lesion. Normal gallbladder. No biliary duct dilatation. Common bile duct is normal. Pancreas: Pancreas is normal. No ductal dilatation. No pancreatic inflammation. Spleen: Normal spleen Adrenals/urinary tract: Adrenal glands and kidneys are normal. The ureters and bladder normal. Stomach/Bowel: The stomach,  duodenum, and small bowel normal. The cecum extends midline into the lower pelvis. The appendix is dilated and is centrally within the lower abdomen/upper pelvis. Dilated appendix measures 13 mm on coronal image 27/series 5. There is enhancement of the wall the appendix. No perforation. Appendix is also seen on axial image 59/2 measuring 12 mm. Small amount of inflammation surrounding the distended appendix. The colon and rectosigmoid colon are normal. Vascular/Lymphatic: Abdominal aorta is normal caliber. No periportal or retroperitoneal adenopathy. No pelvic adenopathy. Reproductive: Unremarkable Other: No free fluid. Musculoskeletal: No aggressive osseous lesion. IMPRESSION: 1. Acute appendicitis.  No perforation 2. Appendix in atypical typical location centrally within the upper pelvis/lower abdomen as described above. Electronically Signed   By: Deboraha Fallow M.D.   On: 08/15/2023 19:50   DG Chest 2 View Result Date: 08/15/2023 CLINICAL DATA:  Chest pain EXAM: CHEST - 2 VIEW COMPARISON:  None Available. FINDINGS: The heart size and mediastinal contours are within normal limits. Both lungs are clear. The visualized skeletal structures are unremarkable. IMPRESSION: No active cardiopulmonary disease. Electronically Signed   By: Reagan Camera M.D.   On: 08/15/2023 16:32    Microbiology: Results for orders placed or performed during the hospital encounter of 08/16/23  Blood Culture (routine x 2)     Status: None   Collection Time: 08/16/23  7:31 AM   Specimen: BLOOD LEFT HAND  Result Value Ref Range Status   Specimen Description BLOOD LEFT HAND  Final   Special Requests   Final    BOTTLES DRAWN AEROBIC AND ANAEROBIC Blood Culture adequate volume   Culture   Final    NO GROWTH 5 DAYS Performed at [facility], [address] [place], [place] [address]    Report Status 08/21/2023 FINAL  Final  Blood Culture (routine x 2)     Status: None   Collection Time: 08/16/23  7:33 AM    Specimen: BLOOD  Result Value Ref Range Status   Specimen Description BLOOD BLOOD RIGHT FOREARM  Final   Special Requests   Final    BOTTLES DRAWN AEROBIC AND ANAEROBIC Blood Culture results may not be optimal due to an inadequate volume of blood received in culture bottles   Culture   Final    NO GROWTH 5 DAYS Performed at [facility], [address], [place], [place] [address]    Report Status 08/21/2023 FINAL  Final  MRSA Next Gen by PCR, Nasal     Status: None   Collection Time: 08/16/23  3:15 PM   Specimen: Nasal Mucosa; Nasal Swab  Result Value Ref Range Status   MRSA by PCR Next Gen NOT DETECTED NOT DETECTED Final    Comment: (NOTE) The GeneXpert MRSA Assay (FDA approved for NASAL specimens only), is one component of a comprehensive MRSA colonization surveillance program. It is not intended to diagnose MRSA infection nor to guide or monitor treatment for MRSA infections. Test performance is not FDA approved  in patients less than 41 years old. Performed at [facility], [address] [place], [place] [address]     Labs: CBC: Recent Labs  Lab 08/16/23 0719 08/17/23 0303 08/18/23 0555 08/20/23 0257  WBC 22.2* 12.1* 8.5 9.8  NEUTROABS 19.3*  --   --   --   HGB 14.5 12.9* 13.5 14.2  HCT 45.8 37.4* 39.9 41.7  MCV 93.3 86.8 87.1 85.6  PLT 109* 91* 109* 125*   Basic Metabolic Panel: Recent Labs  Lab 08/16/23 0719 08/17/23 0303 08/20/23 0257  NA 137 139 133*  K 3.5 3.6 4.4  CL 100 108 101  CO2 18* 25 25  GLUCOSE 60* 99 97  BUN 18 14 23*  CREATININE 1.24 0.92 1.21  CALCIUM  8.8* 8.1* 9.1   Liver Function Tests: Recent Labs  Lab 08/15/23 2107 08/16/23 0719 08/17/23 0303  AST 50* 41 30  ALT 31 29 25   ALKPHOS 40 46 43  BILITOT 1.0 1.0 0.8  PROT 6.7 7.2 5.5*  ALBUMIN 3.7 3.8 2.9*   CBG: Recent Labs  Lab 08/16/23 0859 08/16/23 1338 08/16/23 1854  GLUCAP 90 95 101*    Discharge time spent: less than 30  minutes.  Signed: Montey Apa, DO [facility] 08/22/2023

## 2023-08-20 NOTE — Care Management Important Message (Signed)
 Important Message  Patient Details  Name: Luis Hunter MRN: [id number] Date of Birth: 09-25-1975   Important Message Given:        Brookie Cantor, CMA 08/20/2023, 11:46 AM

## 2023-08-20 NOTE — Care Management Important Message (Signed)
 Important Message  Patient Details  Name: Luis Hunter MRN: [id number] Date of Birth: 06-28-1975   Important Message Given:  Yes - Medicare IM     Gilberto Stanforth W, CMA 08/20/2023, 12:28 PM

## 2023-08-21 LAB — CULTURE, BLOOD (ROUTINE X 2)
Culture: NO GROWTH
Culture: NO GROWTH
Special Requests: ADEQUATE

## 2023-08-23 LAB — BLOOD GAS, VENOUS
Acid-base deficit: 2.6 mmol/L — ABNORMAL HIGH (ref 0.0–2.0)
Bicarbonate: 24.9 mmol/L (ref 20.0–28.0)
O2 Saturation: 16.3 %
Patient temperature: 37
pCO2, Ven: 53 mmHg (ref 44–60)
pH, Ven: 7.28 (ref 7.25–7.43)

## 2023-08-23 NOTE — Anesthesia Postprocedure Evaluation (Signed)
 Anesthesia Post Note  Patient: Luis Hunter  Procedure(s) Performed: APPENDECTOMY, ROBOT-ASSISTED, LAPAROSCOPIC  Patient location during evaluation: PACU Anesthesia Type: General Level of consciousness: awake and alert Pain management: pain level controlled Vital Signs Assessment: post-procedure vital signs reviewed and stable Respiratory status: spontaneous breathing, nonlabored ventilation, respiratory function stable and patient connected to nasal cannula oxygen Cardiovascular status: blood pressure returned to baseline and stable Postop Assessment: no apparent nausea or vomiting Anesthetic complications: yes   Encounter Notable Events  Notable Event Outcome Phase Comment  Difficult to intubate - expected  Intraprocedure Filed from anesthesia note documentation.     Last Vitals:  Vitals:   08/20/23 0815 08/20/23 0816  BP: (!) 140/101 (!) 140/101  Pulse: 64 60  Resp: 18 18  Temp: 36.5 C 36.5 C  SpO2: 100% 100%    Last Pain:  Vitals:   08/20/23 0909  TempSrc:   PainSc: 0-No pain                 Vanice Genre

## 2023-09-08 ENCOUNTER — Emergency Department: Admission: EM | Admit: 2023-09-08 | Discharge: 2023-09-08 | Discharge status: Against Medical Advice

## 2023-09-08 ENCOUNTER — Encounter: Payer: Self-pay | Admitting: *Deleted

## 2023-09-08 ENCOUNTER — Other Ambulatory Visit: Payer: Self-pay

## 2023-09-08 DIAGNOSIS — R42 Dizziness and giddiness: Secondary | ICD-10-CM | POA: Diagnosis present

## 2023-09-08 DIAGNOSIS — R1110 Vomiting, unspecified: Secondary | ICD-10-CM | POA: Diagnosis not present

## 2023-09-08 DIAGNOSIS — Z5321 Procedure and treatment not carried out due to patient leaving prior to being seen by health care provider: Secondary | ICD-10-CM | POA: Insufficient documentation

## 2023-09-08 DIAGNOSIS — R001 Bradycardia, unspecified: Secondary | ICD-10-CM | POA: Insufficient documentation

## 2023-09-08 LAB — CBC WITH DIFFERENTIAL/PLATELET
Abs Immature Granulocytes: 0.12 10*3/uL — ABNORMAL HIGH (ref 0.00–0.07)
Basophils Absolute: 0 10*3/uL (ref 0.0–0.1)
Basophils Relative: 1 %
Eosinophils Absolute: 0.1 10*3/uL (ref 0.0–0.5)
Eosinophils Relative: 1 %
HCT: 43.1 % (ref 39.0–52.0)
Hemoglobin: 14 g/dL (ref 13.0–17.0)
Immature Granulocytes: 1 %
Lymphocytes Relative: 16 %
Lymphs Abs: 1.4 10*3/uL (ref 0.7–4.0)
MCH: 29.7 pg (ref 26.0–34.0)
MCHC: 32.5 g/dL (ref 30.0–36.0)
MCV: 91.5 fL (ref 80.0–100.0)
Monocytes Absolute: 0.7 10*3/uL (ref 0.1–1.0)
Monocytes Relative: 8 %
Neutro Abs: 6.4 10*3/uL (ref 1.7–7.7)
Neutrophils Relative %: 73 %
Platelets: 171 10*3/uL (ref 150–400)
RBC: 4.71 MIL/uL (ref 4.22–5.81)
RDW: 17.2 % — ABNORMAL HIGH (ref 11.5–15.5)
WBC: 8.8 10*3/uL (ref 4.0–10.5)
nRBC: 0 % (ref 0.0–0.2)

## 2023-09-08 LAB — COMPREHENSIVE METABOLIC PANEL WITH GFR
ALT: 13 U/L (ref 0–44)
AST: 24 U/L (ref 15–41)
Albumin: 4.1 g/dL (ref 3.5–5.0)
Alkaline Phosphatase: 46 U/L (ref 38–126)
Anion gap: 7 (ref 5–15)
BUN: 13 mg/dL (ref 6–20)
CO2: 27 mmol/L (ref 22–32)
Calcium: 9.4 mg/dL (ref 8.9–10.3)
Chloride: 103 mmol/L (ref 98–111)
Creatinine, Ser: 0.81 mg/dL (ref 0.61–1.24)
GFR, Estimated: >60 mL/min (ref >60)
Glucose, Bld: 92 mg/dL (ref 70–99)
Potassium: 4.7 mmol/L (ref 3.5–5.1)
Sodium: 137 mmol/L (ref 135–145)
Total Bilirubin: 1.2 mg/dL (ref 0.0–1.2)
Total Protein: 7.3 g/dL (ref 6.5–8.1)

## 2023-09-08 MED ORDER — LEVETIRACETAM IN NACL 500 MG/100ML IV SOLN
500.0000 mg | Freq: Once | INTRAVENOUS | Status: DC
  Filled 2023-09-08: qty 100

## 2023-09-08 NOTE — ED Triage Notes (Signed)
 Pt brought in via ems from home.  Pt reports seizure today.  Pt states seizures for the past 3 days.  Pt has a headache.  No n/v/   pt alert
